# Patient Record
Sex: Male | Born: 1950 | Race: Black or African American | Hispanic: No | Smoking: Never smoker
Health system: Southern US, Community
[De-identification: ages and names within clinical notes are randomized; demographics above are authoritative.]

## PROBLEM LIST (undated history)

## (undated) DIAGNOSIS — N289 Disorder of kidney and ureter, unspecified: Secondary | ICD-10-CM

## (undated) DIAGNOSIS — M1990 Unspecified osteoarthritis, unspecified site: Secondary | ICD-10-CM

## (undated) DIAGNOSIS — E119 Type 2 diabetes mellitus without complications: Secondary | ICD-10-CM

## (undated) DIAGNOSIS — J449 Chronic obstructive pulmonary disease, unspecified: Secondary | ICD-10-CM

## (undated) DIAGNOSIS — I10 Essential (primary) hypertension: Secondary | ICD-10-CM

## (undated) DIAGNOSIS — C801 Malignant (primary) neoplasm, unspecified: Secondary | ICD-10-CM

## (undated) DIAGNOSIS — J45909 Unspecified asthma, uncomplicated: Secondary | ICD-10-CM

## (undated) DIAGNOSIS — I2510 Atherosclerotic heart disease of native coronary artery without angina pectoris: Secondary | ICD-10-CM

## (undated) HISTORY — PX: INSERTION OF DIALYSIS CATHETER: SHX1324

## (undated) HISTORY — PX: NO PAST SURGERIES: SHX2092

---

## 1998-01-20 ENCOUNTER — Encounter: Admission: RE | Admit: 1998-01-20 | Discharge: 1998-01-20 | Payer: Self-pay | Admitting: Internal Medicine

## 1998-01-24 ENCOUNTER — Ambulatory Visit (HOSPITAL_COMMUNITY): Admission: RE | Admit: 1998-01-24 | Discharge: 1998-01-24 | Payer: Self-pay | Admitting: Physician Assistant

## 1998-02-15 ENCOUNTER — Emergency Department (HOSPITAL_COMMUNITY): Admission: EM | Admit: 1998-02-15 | Discharge: 1998-02-15 | Payer: Self-pay | Admitting: Emergency Medicine

## 1999-10-16 ENCOUNTER — Encounter: Admission: RE | Admit: 1999-10-16 | Discharge: 1999-10-16 | Payer: Self-pay | Admitting: Hematology and Oncology

## 1999-11-14 ENCOUNTER — Encounter: Payer: Self-pay | Admitting: Emergency Medicine

## 1999-11-14 ENCOUNTER — Emergency Department (HOSPITAL_COMMUNITY): Admission: EM | Admit: 1999-11-14 | Discharge: 1999-11-14 | Payer: Self-pay | Admitting: Emergency Medicine

## 1999-12-10 ENCOUNTER — Inpatient Hospital Stay (HOSPITAL_COMMUNITY): Admission: EM | Admit: 1999-12-10 | Discharge: 1999-12-12 | Payer: Self-pay | Admitting: Emergency Medicine

## 1999-12-11 ENCOUNTER — Encounter: Payer: Self-pay | Admitting: Internal Medicine

## 2000-01-15 ENCOUNTER — Encounter: Admission: RE | Admit: 2000-01-15 | Discharge: 2000-01-15 | Payer: Self-pay | Admitting: Internal Medicine

## 2000-02-06 ENCOUNTER — Ambulatory Visit (HOSPITAL_COMMUNITY): Admission: RE | Admit: 2000-02-06 | Discharge: 2000-02-06 | Payer: Self-pay | Admitting: Internal Medicine

## 2001-05-25 ENCOUNTER — Encounter: Payer: Self-pay | Admitting: Emergency Medicine

## 2001-05-25 ENCOUNTER — Emergency Department (HOSPITAL_COMMUNITY): Admission: EM | Admit: 2001-05-25 | Discharge: 2001-05-25 | Payer: Self-pay | Admitting: *Deleted

## 2001-05-25 ENCOUNTER — Emergency Department (HOSPITAL_COMMUNITY): Admission: EM | Admit: 2001-05-25 | Discharge: 2001-05-25 | Payer: Self-pay | Admitting: Emergency Medicine

## 2001-05-27 ENCOUNTER — Observation Stay (HOSPITAL_COMMUNITY): Admission: EM | Admit: 2001-05-27 | Discharge: 2001-05-28 | Payer: Self-pay | Admitting: Emergency Medicine

## 2001-05-27 ENCOUNTER — Encounter: Payer: Self-pay | Admitting: Emergency Medicine

## 2001-07-26 ENCOUNTER — Inpatient Hospital Stay (HOSPITAL_COMMUNITY): Admission: AD | Admit: 2001-07-26 | Discharge: 2001-08-03 | Payer: Self-pay | Admitting: Psychiatry

## 2002-02-15 ENCOUNTER — Emergency Department (HOSPITAL_COMMUNITY): Admission: EM | Admit: 2002-02-15 | Discharge: 2002-02-15 | Payer: Self-pay | Admitting: Emergency Medicine

## 2002-07-11 ENCOUNTER — Emergency Department (HOSPITAL_COMMUNITY): Admission: EM | Admit: 2002-07-11 | Discharge: 2002-07-11 | Payer: Self-pay | Admitting: Emergency Medicine

## 2002-07-18 ENCOUNTER — Emergency Department (HOSPITAL_COMMUNITY): Admission: EM | Admit: 2002-07-18 | Discharge: 2002-07-18 | Payer: Self-pay | Admitting: Emergency Medicine

## 2002-11-01 ENCOUNTER — Encounter: Admission: RE | Admit: 2002-11-01 | Discharge: 2002-11-01 | Payer: Self-pay | Admitting: Internal Medicine

## 2002-11-07 ENCOUNTER — Inpatient Hospital Stay (HOSPITAL_COMMUNITY): Admission: EM | Admit: 2002-11-07 | Discharge: 2002-11-10 | Payer: Self-pay

## 2002-11-09 ENCOUNTER — Encounter: Payer: Self-pay | Admitting: Cardiology

## 2002-11-17 ENCOUNTER — Encounter: Admission: RE | Admit: 2002-11-17 | Discharge: 2002-11-17 | Payer: Self-pay | Admitting: Internal Medicine

## 2002-11-22 ENCOUNTER — Encounter: Admission: RE | Admit: 2002-11-22 | Discharge: 2002-11-22 | Payer: Self-pay | Admitting: Internal Medicine

## 2003-03-26 ENCOUNTER — Encounter: Payer: Self-pay | Admitting: Emergency Medicine

## 2003-03-26 ENCOUNTER — Emergency Department (HOSPITAL_COMMUNITY): Admission: AC | Admit: 2003-03-26 | Discharge: 2003-03-27 | Payer: Self-pay

## 2003-06-02 ENCOUNTER — Emergency Department (HOSPITAL_COMMUNITY): Admission: AD | Admit: 2003-06-02 | Discharge: 2003-06-02 | Payer: Self-pay | Admitting: Emergency Medicine

## 2003-07-12 ENCOUNTER — Encounter: Admission: RE | Admit: 2003-07-12 | Discharge: 2003-07-12 | Payer: Self-pay | Admitting: Internal Medicine

## 2003-08-09 ENCOUNTER — Inpatient Hospital Stay (HOSPITAL_COMMUNITY): Admission: EM | Admit: 2003-08-09 | Discharge: 2003-08-12 | Payer: Self-pay | Admitting: Emergency Medicine

## 2003-08-11 ENCOUNTER — Encounter (INDEPENDENT_AMBULATORY_CARE_PROVIDER_SITE_OTHER): Payer: Self-pay | Admitting: Cardiology

## 2003-08-23 ENCOUNTER — Encounter: Admission: RE | Admit: 2003-08-23 | Discharge: 2003-08-23 | Payer: Self-pay | Admitting: Internal Medicine

## 2003-12-03 ENCOUNTER — Emergency Department (HOSPITAL_COMMUNITY): Admission: AD | Admit: 2003-12-03 | Discharge: 2003-12-03 | Payer: Self-pay | Admitting: Emergency Medicine

## 2003-12-09 ENCOUNTER — Encounter: Admission: RE | Admit: 2003-12-09 | Discharge: 2003-12-09 | Payer: Self-pay | Admitting: Internal Medicine

## 2006-04-03 ENCOUNTER — Emergency Department (HOSPITAL_COMMUNITY): Admission: EM | Admit: 2006-04-03 | Discharge: 2006-04-03 | Payer: Self-pay | Admitting: Emergency Medicine

## 2007-01-30 ENCOUNTER — Encounter (INDEPENDENT_AMBULATORY_CARE_PROVIDER_SITE_OTHER): Payer: Self-pay | Admitting: *Deleted

## 2007-01-30 ENCOUNTER — Ambulatory Visit: Payer: Self-pay | Admitting: *Deleted

## 2007-01-30 ENCOUNTER — Emergency Department (HOSPITAL_COMMUNITY): Admission: EM | Admit: 2007-01-30 | Discharge: 2007-01-30 | Payer: Self-pay | Admitting: Emergency Medicine

## 2007-04-01 ENCOUNTER — Inpatient Hospital Stay (HOSPITAL_COMMUNITY): Admission: EM | Admit: 2007-04-01 | Discharge: 2007-04-10 | Payer: Self-pay | Admitting: Emergency Medicine

## 2007-04-01 ENCOUNTER — Ambulatory Visit: Payer: Self-pay | Admitting: Pulmonary Disease

## 2007-04-10 ENCOUNTER — Ambulatory Visit: Payer: Self-pay | Admitting: *Deleted

## 2007-04-21 ENCOUNTER — Ambulatory Visit: Payer: Self-pay | Admitting: Psychiatry

## 2007-04-23 ENCOUNTER — Ambulatory Visit: Payer: Self-pay | Admitting: Family Medicine

## 2007-04-23 ENCOUNTER — Encounter (INDEPENDENT_AMBULATORY_CARE_PROVIDER_SITE_OTHER): Payer: Self-pay | Admitting: Nurse Practitioner

## 2007-04-23 LAB — CONVERTED CEMR LAB
ALT: 24 units/L (ref 0–53)
AST: 24 units/L (ref 0–37)
Alkaline Phosphatase: 111 units/L (ref 39–117)
Basophils Absolute: 0 10*3/uL (ref 0.0–0.1)
Basophils Relative: 0 % (ref 0–1)
Eosinophils Absolute: 0.1 10*3/uL (ref 0.0–0.7)
Eosinophils Relative: 2 % (ref 0–5)
Glucose, Bld: 126 mg/dL — ABNORMAL HIGH (ref 70–99)
HCT: 41.7 % (ref 39.0–52.0)
MCHC: 32.9 g/dL (ref 30.0–36.0)
MCV: 79.7 fL (ref 78.0–100.0)
Neutrophils Relative %: 48 % (ref 43–77)
Platelets: 219 10*3/uL (ref 150–400)
Potassium: 3.2 meq/L — ABNORMAL LOW (ref 3.5–5.3)
RDW: 14.1 % — ABNORMAL HIGH (ref 11.5–14.0)
Sodium: 141 meq/L (ref 135–145)
Total Bilirubin: 0.5 mg/dL (ref 0.3–1.2)
Total Protein: 6.9 g/dL (ref 6.0–8.3)

## 2007-04-24 ENCOUNTER — Encounter (INDEPENDENT_AMBULATORY_CARE_PROVIDER_SITE_OTHER): Payer: Self-pay | Admitting: Nurse Practitioner

## 2007-04-24 LAB — CONVERTED CEMR LAB: Hgb A1c MFr Bld: 6.4 % — ABNORMAL HIGH (ref 4.6–6.1)

## 2007-05-06 ENCOUNTER — Ambulatory Visit: Payer: Self-pay | Admitting: Family Medicine

## 2007-05-11 ENCOUNTER — Ambulatory Visit (HOSPITAL_BASED_OUTPATIENT_CLINIC_OR_DEPARTMENT_OTHER): Admission: RE | Admit: 2007-05-11 | Discharge: 2007-05-11 | Payer: Self-pay | Admitting: Urology

## 2007-05-15 ENCOUNTER — Ambulatory Visit: Payer: Self-pay | Admitting: Internal Medicine

## 2007-05-21 ENCOUNTER — Ambulatory Visit (HOSPITAL_COMMUNITY): Admission: RE | Admit: 2007-05-21 | Discharge: 2007-05-22 | Payer: Self-pay | Admitting: Urology

## 2007-06-08 ENCOUNTER — Ambulatory Visit: Payer: Self-pay | Admitting: Internal Medicine

## 2007-10-26 ENCOUNTER — Ambulatory Visit: Payer: Self-pay | Admitting: Cardiology

## 2007-10-26 ENCOUNTER — Inpatient Hospital Stay (HOSPITAL_COMMUNITY): Admission: EM | Admit: 2007-10-26 | Discharge: 2007-11-01 | Payer: Self-pay | Admitting: Emergency Medicine

## 2007-10-26 ENCOUNTER — Encounter: Payer: Self-pay | Admitting: Cardiology

## 2007-11-08 ENCOUNTER — Emergency Department (HOSPITAL_COMMUNITY): Admission: EM | Admit: 2007-11-08 | Discharge: 2007-11-08 | Payer: Self-pay | Admitting: Emergency Medicine

## 2007-11-18 ENCOUNTER — Ambulatory Visit: Payer: Self-pay | Admitting: Cardiology

## 2007-11-18 LAB — CONVERTED CEMR LAB
BUN: 29 mg/dL — ABNORMAL HIGH (ref 6–23)
Calcium: 8.7 mg/dL (ref 8.4–10.5)
Creatinine, Ser: 2.1 mg/dL — ABNORMAL HIGH (ref 0.4–1.5)
GFR calc Af Amer: 42 mL/min
Potassium: 3.3 meq/L — ABNORMAL LOW (ref 3.5–5.1)

## 2007-11-22 ENCOUNTER — Ambulatory Visit: Payer: Self-pay | Admitting: Cardiology

## 2007-11-22 ENCOUNTER — Inpatient Hospital Stay (HOSPITAL_COMMUNITY): Admission: EM | Admit: 2007-11-22 | Discharge: 2007-11-27 | Payer: Self-pay | Admitting: Emergency Medicine

## 2007-12-09 ENCOUNTER — Ambulatory Visit: Payer: Self-pay | Admitting: Cardiology

## 2007-12-09 LAB — CONVERTED CEMR LAB
BUN: 19 mg/dL (ref 6–23)
CO2: 27 meq/L (ref 19–32)
Calcium: 9 mg/dL (ref 8.4–10.5)
Chloride: 109 meq/L (ref 96–112)
GFR calc Af Amer: 45 mL/min
GFR calc non Af Amer: 37 mL/min

## 2007-12-23 ENCOUNTER — Ambulatory Visit: Payer: Self-pay

## 2007-12-23 ENCOUNTER — Ambulatory Visit: Payer: Self-pay | Admitting: Cardiology

## 2007-12-23 LAB — CONVERTED CEMR LAB
ALT: 27 units/L (ref 0–53)
AST: 27 units/L (ref 0–37)
Albumin: 3.5 g/dL (ref 3.5–5.2)
HDL: 28.3 mg/dL — ABNORMAL LOW (ref >39.0)
Triglycerides: 94 mg/dL (ref 0–149)

## 2008-01-25 ENCOUNTER — Emergency Department (HOSPITAL_COMMUNITY): Admission: EM | Admit: 2008-01-25 | Discharge: 2008-01-25 | Payer: Self-pay | Admitting: Emergency Medicine

## 2011-02-05 NOTE — Discharge Summary (Signed)
NAME:  ANDEN, BARTOLO NO.:  [id number]   MEDICAL RECORD NO.:  [id number]          PATIENT TYPE:  INP   LOCATION:  1539                         FACILITY:  [facility]   PHYSICIAN:  Casimiro Needle B. Sherene Sires, MD, FCCPDATE OF BIRTH:  Feb 02, 1951   DATE OF ADMISSION:  04/01/2007  DATE OF DISCHARGE:  04/10/2007                               DISCHARGE SUMMARY   DISCHARGE DIAGNOSES:  1. Overdose with clonidine.  2. Severe hypertension.   HISTORY OF PRESENT ILLNESS:  [name] is a 60 year old black male who  has a known history of polysubstance abuse including cocaine in the  past.  He presented to the emergency room at [facility] with  altered mental status.  He was found to be extremely hypertensive and  was suspected to have overdosed on clonidine.  He was admitted for  further evaluation and treatment.   LABORATORY DATA:  A 12-lead EKG demonstrates normal sinus rhythm,  ventricular rate of 73 beats per minute.  Urine drug screen was  negative.  Acetaminophen level was less than 10.  Total protein 7.1,  albumin 3.6, AST is 37, ALT is 39, ALP is 133, total bilirubin is 0.5.  B-type natriuretic peptide is 82.7.  TSH is 2.74.  Sodium 137, potassium  3.1, chloride 104, CO2 is 24, glucose is 147, BUN is 27, creatinine  1.55.  WBC is 7.2, hemoglobin is 14.8, hematocrit is 42.9, platelets are  224.  On 3 L nasal cannula, pH is 7.455, pCO2 of 38, pO2 of 121, with  bicarb of 26.3.  Head CT without contrast demonstrates normal  examination.  Portable chest shows mild cardiomegaly with low  inspiratory lung volumes, no acute findings.   HOSPITAL COURSE BY DISCHARGE DIAGNOSIS:  Problem 1.  ALTERED MENTAL STATUS WITH CLONIDINE OVERDOSE:  He was  admitted to the [facility] and admitted initially to the  intensive care unit, then subsequently to the floors.  He was noted to  be hypertensive and requiring Apresoline IV.  Of note, at intermittent  times he refused treatment  for his hypertension.  He was placed back on  his hypertensive medications, which consist of clonidine 0.3 mg three  times a day and minoxidil 5 mg twice a day.  He has remained  hypertensive until his time of signing out discharge against medical  advice on April 10, 2007.   Problem 2.  ALTERED MENTAL STATUS WITH A FORMAL PSYCHIATRIC EVALUATION  PERFORMED BY DR. Jeanie Sewer.  Initially it was thought that he needed  admission for his outstanding history.  He subsequently was found to no  longer meet criteria for involuntary commitment.  He was given the  option of follow-up with outpatient mental health care.  This was set up  for him.  Whether or not he will utilize it is not known.  He signed out  against medical advice on April 10, 2007.   DISCHARGE MEDICATIONS:  Again, he singed out against medical advice.  He  was supposed to be on clonidine 0.3 mg three times a day and minoxidil 5  mg b.i.d.   His  diet is a low-sodium diet.   FOLLOW-UP APPOINTMENT:  He had none since he signed out against medical  advice.  He was to follow up with outpatient mental health.  Again, this  will be his choice on an outpatient basis.   DISPOSITION/CONDITION ON DISCHARGE:  His hypertension was somewhat  improved but he did remain hypertensive prior to discharge.  His altered  mental status improved.  His psychiatric ideation improved while he was  in the hospital and he declined further follow-up.  He signed out [facility] on  April 10, 2007.      Devra Dopp, MSN, ACNP      Charlaine Dalton. Sherene Sires, MD, [facility]  Electronically Signed    SM/MEDQ  D:  04/27/2007  T:  04/27/2007  Job:  [id number]   cc:   Antonietta Breach, M.D.

## 2011-02-05 NOTE — Op Note (Signed)
NAME:  Noah Lewis, Noah Lewis NO.:  [id number]   MEDICAL RECORD NO.:  [id number]          PATIENT TYPE:  OIB   LOCATION:  1439                         FACILITY:  [facility]   PHYSICIAN:  Lindaann Slough, M.D.  DATE OF BIRTH:  [birth year]   DATE OF PROCEDURE:  05/21/2007  DATE OF DISCHARGE:                               OPERATIVE REPORT   PREOPERATIVE DIAGNOSIS:  Scrotal abscess.   POSTOPERATIVE DIAGNOSIS:  Scrotal abscess.   PROCEDURE PERFORMED:  Incision and drainage of scrotal abscess with  debridement.   SURGEON:  Danae Chen, M.D.   ASSISTANT:  Melina Schools.   ANESTHESIA:  General.   INDICATIONS FOR PROCEDURE:  Mr. Bartnik is a 60 year old male who  underwent a transscrotal hydrocelectomy approximately 8 days ago.  Please note the patient is diabetic.  The patient returned to the office  with a complaint of this several days increased swelling, drainage and  subjective fevers.  On physical exam, he was noted fluctuance in his  left hemiscrotum and a few areas of purulent drainage, most notably at  the site of his Penrose drain.  He was taken to the operating room for  drainage.   DESCRIPTION OF PROCEDURE:  The patient was brought to the operating  room.  He was identified and placed in the supine position.  Informed  consent was given and preoperative time-out performed.  After the  successful induction of general anesthesia, the patient's genitalia were  prepped and draped in the usual fashion.  Sequential compression devices  were employed.  Vancomycin and kanamycin were given as perioperative  antibiotics.  The surgeons were gowned and gloved.  We examined under  anesthesia and noted that the right testis appeared palpably normal.  There was breakdown at the area of the previous incision along the  median raphe.  This was bluntly entered.  We noted the scrotal septum to  be intact and there was no inflammation crossing midline.  We then  proceeded laterally  with the digital palpation and easily entered an  area of purulence.  We drained approximately 100 mL of pus.  Some of  this was collected and sent for culture, both aerobic and anaerobic.  We  then delivered the testis into the operative field.  We noted the  previously repaired hydrocele with the bottle neck repair intact.  The  testis and epididymis were examined and appeared viable.  We then  debrided some tissue along the lateral aspect.  We used Bovie cautery to  ensure adequate hemostasis.  We then again reexamined the inside wall to  ensure that there were no undrained pockets of pus.  When we were  satisfied, the wound was packed with saline moistened Kerlix and the  procedure was terminated.  The patient tolerated procedure well and no  complications.   Dr. Brunilda Payor was the primary responsible physician and was present and  participated in all aspects of procedure.   DISPOSITION:  The patient was extubated uneventfully, transferred safely  to post anesthesia.     ______________________________  Lennox Grumbles, M.D.  Electronically Signed    JR/MEDQ  D:  05/21/2007  T:  05/22/2007  Job:  [id number]

## 2011-02-05 NOTE — Consult Note (Signed)
NAME:  MENG, WINTERTON NO.:  [id number]   MEDICAL RECORD NO.:  [id number]          PATIENT TYPE:  INP   LOCATION:  1539                         FACILITY:  [facility]   PHYSICIAN:  Nelda Bucks, MD DATE OF BIRTH:  1950-12-11   DATE OF CONSULTATION:  04/09/2007  DATE OF DISCHARGE:  04/10/2007                                 CONSULTATION   BRIEF NOTE:  The patient's nurse at the bedside today on April 09, 2007,  at approximately 10 p.m. called secondary to a blood pressure of  190/104.  Mr. Duque came in secondary to clonidine overdose and known  to have hypertension and, now is refusing his clonidine blood pressure  medication dosed at 0.3 t.i.d.  The patient also receives minoxidil.  The patient is not having headaches and no other symptoms at this time.  The patient is alert, according to nurse, oriented x3, appropriate and  understands the ramifications of refusing his blood pressure pills.  I  stressed to the nurse to speak directly with the patient to tell him  that he could have a stroke, a heart attack, heart failure, or die  secondary to not taking his clonidine medications.  I also offered this  patient to be placed a patch of clonidine 0.3 at this time which he has  accepted.  We will place the patch and hope for improvement in his blood  pressure.  If this does not respond, we will again have another  conversation regarding the need for blood pressure medications and the  risks as noted above for not taking his blood pressure medications.      Nelda Bucks, MD  Electronically Signed     DJF/MEDQ  D:  04/09/2007  T:  04/10/2007  Job:  [id number]

## 2011-02-05 NOTE — Consult Note (Signed)
NAME:  Noah Lewis, SEBRING NO.:  [id number]   MEDICAL RECORD NO.:  [id number]          PATIENT TYPE:  INP   LOCATION:  5158                         FACILITY:  [facility]   PHYSICIAN:  Darryl D. Prime, MD    DATE OF BIRTH:  09/23/1951   DATE OF CONSULTATION:  DATE OF DISCHARGE:                                 CONSULTATION   CARDIOLOGIST:  Dr. Jens Som.   REASON FOR CONSULTATION:  Pre-operative recommendations, given a history  of recent hypotensive crisis and a history of coronary artery disease,  recent chest pain.   HISTORY OF PRESENT ILLNESS:  Mr. 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  The patient had  a CT of the abdomen that showed sigmoid diverticulitis, with possible  microperforation and early abscess in that area, admitted to surgery for  close observation, possible surgery if he worsens, possible elective  surgery in the morning.  He was placed on Invanz.  The patient had a  recent admission on the October 26, 2007, with shortness of breath for  two weeks and chest pain, not taking any blood pressure meds at that  time and had heavy salt load prior and presented with a blood pressure  of 202/133, EKG showing left ventricular hypertrophy with ST  depressions, T-wave inversions, repolarization abnormalities consistent  with LVH.  He had a creatinine of 2.0 at that time.  Baseline was 105.  B-type natriuretic peptide was 851.  A chest x-ray showed a pulmonary  edema.  He was diuresed on blood pressure control and had an adenosine  Myoview on the 6th of February, showing an ejection fraction of 39%.  It  is global hypokinesis and no ischemia, which echo on admission showed an  ejection fraction of 60% with  left ventricular hypertrophy, right  ventricular hypertrophy.  He was seen in the emergency room November 08, 2007 here at [facility] for left arm numbness and left leg numbness for  10 seconds.  Blood pressure then was 104/72 with a creatinine of 2.7,  not admitted.  Seen in the clinic on November 18, 2007, blood pressure  159/105 and weight 205 pounds and noting that he was compliant with his  meds.  The patient, since last admitted, had three episodes of chest  pain lasting a few minutes and relieved with nitroglycerin.  His last  episode was on yesterday.  He denies any recent shortness of breath,  paroxysmal nocturnal dyspnea, or orthopnea.   PAST MEDICAL HISTORY:  As above, and also a history of:  1. Uncontrolled hypertension.  2. Coronary artery disease, status post MI, one in 69.  He has had a      catheterization in 2004 showing left ventricular ejection fraction      of 40% to 55%, but no significant coronary artery disease then.  3. History of scrotal abscess in 2008.  4. History of diabetes type 2.  Hemoglobin A1c last month was 6.6.  5. History of peripheral vascular disease.  6. History of hyperlipidemia.  7. History of polysubstance abuse.  8. History of continued history of left ventricular hypertrophy as      above.   MEDICATIONS:  He is on:  1. Lasix 40 mg daily.  2. Hydralazine 100 mg 4 times a day.  3. Norvasc 10 mg daily.  4. Zocor 40 mg daily.  5. Potassium 20 mEq daily.  6. Labetalol 400 mg twice a day.   ALLERGIES:  HE IS ALLERGIC TO PENICILLIN WHICH CAUSES HIVES.   SOCIAL HISTORY:  He is a smoker for greater than 30 years, one pack per  day.  He is separated, has two children.  History of polysubstance abuse  as above, none in 15 years.  History of alcohol in the form of liquor 2  to 3 times a week.   FAMILY HISTORY:  Positive for mother with diabetes, sister with  diabetes.   REVIEW OF SYSTEMS:  A 14-point review of systems negative, unless  stated  above.  He has not had any fevers or nausea and vomiting.   PHYSICAL EXAMINATION:  VITAL SIGNS:  Temperature was 97.6 with a pulse  of 96, respiratory rate is 18, blood pressure 151/97, sats are 96% on  room air.  GENERAL:  He is a male that looks his stated age in no acute distress,  laying flat in bed.  HEENT:  Eyes:  Pupils are equal, round, and reactive to light with  extraocular movements intact.  He has a pterygia laterally on the right  side in the eye.  The conjunctivae is not pale, anicteric sclerae,  oropharynx is dry significantly so.  No posterior pharyngeal lesions.  NECK:  Supple with no lymphadenopathy, thyromegaly.  No bruits.  ABDOMEN:  Mildly distended with significant tenderness, diffusely more  so, apparently in the left lower quadrant.  LUNGS:  Clear to auscultation bilaterally.  CARDIOVASCULAR:  Regular rhythm and rate, with a S4, no S3, normal S1,  S2, no murmurs.  EXTREMITIES:  The extremities show no clubbing, cyanosis.  There is  trace lower extremity edema.  NEUROLOGICAL:  He is alert and oriented x4, cranial nerves 2-12 grossly  intact, strength and sensation grossly intact.   LABORATORY DATA:  His laboratory data shows a sodium of 136, potassium  3.6, chloride 104, bicarb 24, BUN 18, creatinine 2.04, glucose 98, alk  phos of 128, albumin 3.0, otherwise normal LFTs, white count 60.6,  hemoglobin 12.4, hematocrit 37, platelets 252, segs of 77, lymphocytes  11, MCV is 78.5.  Urinalysis shows a protein of 100, otherwise negative.  Lipase was normal.  EKG showed normal sinus rhythm with a ventricular  rate of 92, normal axis and intervals, PR interval 160, QRS 86, QT  corrected 442 msec.  Showed LVH with repolarization abnormalities, T-  wave inversions in the inferolateral leads, no major change compared to  EKG November 08, 2007.   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  Has  had some chest pain since then, who now may have to have some elective  surgery for diverticulitis with microperforation.  The patient's blood  pressure  is relatively controlled now.  It is recommended that his blood  pressure be well-controlled during the course of hospitalization, and  that includes continue his current medication  regimen.  Should there be an event where he can not take his p.o.  medications, it is recommended that he be on 1-2 mg a minute of  intravenous labetalol during that time.   Thank you very much for the consult.      Darryl D. Prime, MD  Electronically Signed     DDP/MEDQ  D:  11/22/2007  T:  11/22/2007  Job:  [id number]

## 2011-02-05 NOTE — Consult Note (Signed)
NAME:  Noah Lewis, Noah Lewis NO.:  [id number]   MEDICAL RECORD NO.:  [id number]          PATIENT TYPE:  INP   LOCATION:  1539                         FACILITY:  [facility]   PHYSICIAN:  Antonietta Breach, M.D.  DATE OF BIRTH:  05/06/1951   DATE OF CONSULTATION:  04/09/2007  DATE OF DISCHARGE:  04/10/2007                                 CONSULTATION   REQUESTING PHYSICIAN:  Sandrea Hughs.   REASON FOR CONSULTATION:  Rule out suicidal risk.   HISTORY OF PRESENT ILLNESS:  [name] is a 60 year old male admitted  to the [facility] on April 01, 2007.   [name] took an overdose of clonidine in a suicide attempt on April 01, 2007.  He states that he was impulsive and took the medicine in a  reaction to hearing that he was not going to be able to see his  children.  He realizes that it was an impulsive, irrational thing to do,  and he has an appropriate level of regret about it.   The patient did require medical treatment due to complications of the  overdose, and initially, he was resistant to being held in the hospital.  A psychiatrist from the behavioral health department of [facility] evaluated him over the weekend and stated that he still  required confinement.   Earlier in the week, the patient states that he was intending to leave  against medical advise, and he was angry.  However, he realized that the  medical facility had reason to keep him, and he was able to resolve his  anger.   He has been compliant on the current medical ward with bedside care.  He  has agreed to take his blood pressure medicine and has not been  combative.  He has requested to leave the hospital; however, has  remained in his hospital room until further evaluated by the undersigned  without any agitation or combativeness.  He has been in touch with a law  enforcement member of his family regarding his rights.   The patient does not have any hallucinations or  delusions.  He states  that his interests are within normal limits.  He does have constructive  future goals.  He has been in contact with his ex-wife.  She states that  she will allow him to visit his children.   PAST PSYCHIATRIC HISTORY:  The patient does acknowledge having history  of violence.  It is reported that he went to jail for many years for  murder.  The patient is interested in the mental health follow-up.  However, he declines inpatient psychiatric care.   The patient does have a history of inpatient psychiatric care in  November 2002.  At that time, he was admitted to the [facility] with a relapse on alcohol, cocaine and marijuana.  He was stressed by his finding out that his wife had been sleeping with  another man.  He had been drinking 6-12 beers daily and using cocaine  several times a week.  He was having some intermittent suicidal thoughts  without plan or intent.  He had acknowledged picking up his wife by the  neck and then putting her back down.  He had heard an auditory  hallucinations stating go  and get clean.  He was treated with a  phenobarbital taper, Zoloft for antidepression and Zyprexa for  antipsychosis and agitation.  He was discharged on Zoloft 100 mg daily  and Zyprexa 7.5 mg every night.  His diagnoses were depression not  otherwise specified, cannabis abuse, cocaine abuse and alcohol abuse.   The patient has also been admitted multiple times to [facility] for suicide attempts.   FAMILY PSYCHIATRIC HISTORY:  None known.   SOCIAL HISTORY:  The patient was educated through the 11th grade.  He  has been medically disabled from hypertension and a back condition as  well as myocardial infarction.  He has a 96 year old daughter.   The patient was in prison for violent crimes.   PAST MEDICAL HISTORY:  1. Hypertension.  2. History of syncope.  3. Coronary artery disease.  4. Myocardial infarction.  5. Hydrocele  of the right testicle.  6. Diabetes type 2.  7. Hypertriglyceridemia.  8. Peripheral vascular disease.   MEDICATIONS:  MAR is reviewed.  The patient is currently undergoing  stabilization of his blood pressure.  He is undergoing a clonidine  patch.   ALLERGIES:  PENICILLIN.   Head CT without contrast on July 9 showed a normal examination.   REVIEW OF SYSTEMS:  Noncontributory.   MENTAL STATUS EXAM:  Noah Lewis is a middle-aged male sitting up in his  hospital chair in no apparent distress.  He has intact attention span as  well as concentration.  He is oriented to all spheres.  His memory is  intact to immediate recent and remote.  His speech involves normal rate  and prosody without dysarthria.  Thought process - logical, coherent,  goal-directed.  No looseness of associations.  Thought content - no  thoughts of harming himself.  No thoughts of harming.  No delusions, no  hallucinations.  Affect is broad and appropriate.  Mood is within normal  limits.  Insight is intact.  Judgment is intact.   ASSESSMENT:  AXIS I:  Adjustment disorder with mixed disturbance of  emotions and conduct.  Polysubstance dependence.  AXIS II:  Deferred.  AXIS III:  See general medical problems.  AXIS IV:  General medical.  Primary support group.  Marital.  Legal.  AXIS V:  55.   Mr. Delucia is no longer at risk to harm himself.  He agrees to call  emergency services immediately for any thoughts of harming himself,  thoughts of harming others or distress.   The undersigned did recommend inpatient psychiatric further evaluation  and treatment given that the patient did make a suicide attempt  recently, and he has only been on a general medical floor.  The  inpatient psychiatric program would provide a way for him to receive  intensive psychotherapy quickly.   (The patient does not have health insurance, and outpatient programs  available to him are limited where as there is a potential county   finding for inpatient services including a chemical dependency inpatient  rehab unit downtown).   However, the patient declined, and he is no longer meeting the criteria  for forced care for petitioning for commitment   The patient states that he is motivated for outpatient mental health  follow-up.   The issue of the capacity for informed consent regarding  his blood  pressure treatment was discussed.  The patient does understand his  medical risk of morbidity and mortality if he does not get adequate  blood pressure treatment and control.  At this time, he is still  intending on leaving the hospital against medical advice.  He does  demonstrate the capacity for informed consent and the capacity to sign  out AMA fully aware of his risks.   RECOMMENDATIONS:  1. Given that the patient is currently in a stable state and the fact      that his past substance abuse patterns confound assessment as to      whether he would need a preventive mood stabilizer, no      psychotropics are recommended.  2. Would ask the social worker to set this patient up with outpatient      psychiatric follow-up as soon as possible within the next week.      Options include the alcohol and drug services of the county mental      health center as well as [facility].      Antonietta Breach, M.D.  Electronically Signed     JW/MEDQ  D:  04/15/2007  T:  04/16/2007  Job:  [id number]

## 2011-02-05 NOTE — Assessment & Plan Note (Signed)
[facility]                            CARDIOLOGY OFFICE NOTE   CAULDER, WEHNER                      MRN:          [id number]  DATE:11/18/2007                            DOB:          21-Aug-1951    The patient is a 56-year gentleman that was recently admitted to [facility] with dyspnea, chest discomfort and hypertensive urgency.  During that admission, the patient's blood pressure was severely  elevated.  He was treated with antihypertensives and he did improve.  Note, during that admission he had an echocardiogram that showed normal  LV function with estimated ejection fraction of 60%.  There was moderate  LVH.  There was mild aortic and mitral regurgitation.  The left atrium  was moderately to markedly dilated.  There was a small pericardial  effusion.  He also had a Myoview performed.  That showed ejection  fraction of 39%, but there was no ischemia.  He also had chest CT that  showed small pericardial effusion and two small pulmonary nodules with  recommend follow-up in 6 months.  Finally, the patient also had renal  insufficiency.  He was discharged on a blood pressure medications and  did feel improved.  He does have some dyspnea on exertion.  There is no  orthopnea, PND or pedal edema.  He has had two episodes of chest pain.  Each of these lasted for several minutes.  They were not pleuritic or  positional, nor were they related to food.  They were not exertional.  One was in the left chest area and was in the right.  Note, he does not  have exertional chest pain or syncope.  He does continue to smoke  cigarettes and marijuana.   He also was seen in the [facility] Room on November 08, 2007.  At that time, he states that his left arm and left leg went numb for  10 seconds.  He was given pain medications by his report.  He did have  blood work drawn during that evaluation.  His point of care markers were  negative.   Creatinine was 2.7.  His BUN was 31.  Potassium was 3.8.   PRESENT MEDICATIONS:  1. Include Lasix 40 mg p.o. daily.  2. Hydralazine 100 mg p.o. four times daily.  3. Norvasc 10 mg daily.  4. Zocor 40 mg daily.  5. Potassium 20 mEq p.o. daily.  6. Labetalol 400  mg p.o. b.i.d.   PHYSICAL EXAM:  Shows a blood pressure 159/105, but the patient has not  taken his medications today.  His pulse is 86.  Weight is 205 pounds.  HEENT:  Normal.  NECK:  Supple.  CHEST:  Clear.  CARDIOVASCULAR:  Regular rate and rhythm.  ABDOMEN: Shows no tenderness.  EXTREMITIES:  Showed no edema.   His electrocardiogram shows sinus rhythm at a rate of 80.  There is  lateral T-wave inversion which is improved compared to the hospital.   DIAGNOSES:  1. Severe hypertension - I have asked him to continue his present      medications.  He  will track his blood pressure we will adjust his      regimen as indicated.  We will check renal Dopplers as well.  2. Recent numb sensation in the left upper and left lower extremity -      We will check carotid Dopplers.  3. Renal insufficiency - We will check a BMET today and I will refer      him to nephrology for close followup.  4. Hyperlipidemia - He will continue on his Zocor and we will check      lipids and liver in 4 weeks and adjust as indicated.  5. Substance abuse/tobacco abuse - I have discussed importance of      discontinuing both of these.  6. History of abnormal chest CT - He will need a followup chest CT      without contrast in 6 months.  7. Question of infiltrative cardiomyopathy on echocardiogram - This is      most likely related to be the patient's hypertension.   I will see him back in 3 months.     Madolyn Frieze Jens Som, MD, [facility]  Electronically Signed    BSC/MedQ  DD: 11/18/2007  DT: 11/18/2007  Job #: [id number]

## 2011-02-05 NOTE — H&P (Signed)
NAME:  Noah Lewis, Noah Lewis NO.:  [id number]   MEDICAL RECORD NO.:  [id number]          PATIENT TYPE:  INP   LOCATION:  3714                         FACILITY:  [facility]   PHYSICIAN:  Madolyn Frieze. Jens Som, MD, FACCDATE OF BIRTH:  [birth year]   DATE OF ADMISSION:  10/26/2007  DATE OF DISCHARGE:                              HISTORY & PHYSICAL   TIME OF ADMISSION:  Approximately 12:00 p.m.   CHIEF COMPLAINT:  Shortness of breath x3 weeks.   Patient does not have a primary MD or a primary cardiologist.   HISTORY OF PRESENT ILLNESS:  Noah Lewis is a 60 year old male with a  history of uncontrolled hypertension, new-onset diabetes type 2, and a  history of myocardial infarctions in the 80s with a catheterization in  2004 showing a left ventricular ejection fraction of 40-45% and no  significant CAD or outflow obstruction, who presents with three weeks of  worsening shortness of breath on exertion, chest pressure, and  orthopnea.  The shortness of breath with exertion with associated chest  pressure and chest pain occurs after walking half a block or lying flat  for 20 seconds.  The symptoms are relieved with rest and sitting up.  His pain is described as a 9/10.  It is more of a pressure pain with  occasional stabbing sensations located in the center of his chest and  radiating down his left arm on occasion.  Nitroglycerin has relieved the  pain.  At home, he is not taking anything for his symptoms other than  the nitroglycerin sublingual.  He has also been having some  lightheadedness but has never actually passed out or has had any change  in his mental status.  He has not had any diaphoresis.  He states that  he has been off of his blood pressure medication for 3-4 months simply  because he does not like to take pills.  He knows that in the past he  has been on clonidine.   He presents to the ER today because he felt like he was going to pass  out, and he was walking with  his nephew, who saw how bad he looked and  forced him to come to the emergency room.  The night prior to admission,  he had some whiskey and a lot of fried chicken while watching the Super  Bowl.  He has had some coughing with his symptoms and then coughed up a  scant amount of blood the other day.   PAST MEDICAL HISTORY:  1. Uncontrolled hypertension.  2. He is status post an MI, the last one in 27.  He had a      catheterization in 1986 and the most recent one in 2004 with a      normal left ventricular ejection fraction of 40-45%, as previously      stated.  He also had an Adenosine Cardiolite with an ejection      fraction of 43%.  3. He had a scrotal abscess in 2008.  4. Diabetes mellitus, type 2, for which he has taken medications in  the past.  5. Peripheral vascular disease.  6. Hypertriglyceridemia.  7. History of alcohol abuse.  8. History of polysubstance abuse.  9. History of LVH.  10.History of peptic ulcer disease.   SOCIAL HISTORY:  He is a smoker.  He says that he has smoked one pack  over a month for over 30 years.  He lives in the [place].  He is  separated, but he has two children.  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  He denies drug use over the last 15 years.  He has  not used any herbal medications.  He states that he drinks 40 ounces of  beer about once a month and hard liquor infrequently, maybe 2-3 times a  week or two.  He does not drink wine.  He does not exercise.   He has a mother with diabetes and many other medical conditions.  His  father died of an MVA.  His sister has diabetes.   REVIEW OF SYSTEMS:  Positive for headache that he developed after  starting on a nitroglycerin drip.  He also has nocturia, 2-3 episodes  per night.  This is consistent.  He says he has had some presyncopal  episodes.   CODE STATUS:  Patient states that he would like to be a DNR/DNI, but if  he needs a cardiac procedure, that during that  procedure should he code,  he would want to be resuscitated.   PHYSICAL EXAMINATION:  Temperature had not been taken prior to this  dictation.  His pulse is 100.  Respiratory rate 28.  Blood pressure  202/133 on presentation to the ED.  O2 sat 96% on room air.  He was sitting upright and in no apparent distress.  HEENT:  Benign.  NECK:  Supple with no bruits.  No TMG.  ABDOMEN:  Soft, nontender, nondistended with no rebound or guarding.  CARDIOVASCULAR:  S1 and S2.  No murmurs.  Regular rate and rhythm.  He  has an axillary displaced PMI and a positive S4.  PULMONARY:  He had bibasilar mild crackles with decreased breath sounds.  On his lower extremities, he had some changes consistent with venous  insufficiency and peripheral vascular disease, but he had only trace  bilateral edema.  He had no palpable cords.  He had left CVA tenderness.  He was alert and oriented x3.  Psych was appropriate with no gross  neurologic findings.   An x-ray was pertinent for CHF.   His EKG showed a rate of 98 with normal sinus rhythm, ST depression in  leads V4-6 that was consistent based on prior EKGs, normal intervals,  and LVH.  He was also normal axis.   Hemoglobin 13, hematocrit 38, white blood cell count 8, platelets 227.  On an i-STAT, his sodium was 145, potassium 2.8, chloride 107, bicarb  28, BUN 20, creatinine 2, glucose 109.  He had an BNP of 871.  The first  set of cardiac enzymes:  MB 1.7, troponin I 0.17.   ASSESSMENT/PLAN:  A 60 year old male with a past medical history of  diabetes, hypertension, medical noncompliance, cardiomegaly, depression,  admitted with two weeks of shortness of breath, off of his blood  pressure medications for at least four months.  Last echo in 2004 with  an EF of 40-45%.  Chest x-ray now consistent with CHF.  BNP 871,  consistent with CHF.  EKG essentially unchanged.  1. Shortness of breath:  Likely  secondary to congestive heart failure      exacerbation,  which again, most likely represents worsening of his      chronic condition; however, will rule out myocardial infarction,      unstable angina, acute coronary syndrome as possible etiologies.      Treat with Lasix, blood pressure control, as his severe      hypertension is also contributing to his heart failure.  Check a 2D      echo, TSH, cycle cardiac enzymes and EKGs.  Admit to telemetry.  As      it is unlikely that he is having an acute myocardial infarction, we      will discontinue the heparin, put him on Lovenox for DVT      prophylaxis.  Start a beta blocker, Coreg 3.125 b.i.d.  Hold ACE      inhibitor because of his elevated creatinine.  Will start ACE      inhibitor if his creatinine improves.  Start simvastatin 40 mg once      daily.  Morphine p.r.n. pain.  Oxygen.  Titrate nitroglycerin down.      Tylenol for pain for headache associated with the nitroglycerin      drip.  2. Hypertension:  Will need better control.  History of uncontrolled      hypertension in the past, even on medications.  Will start him now      on hydralazine t.i.d. for its concomitant beneficial effects in      African-American populations with CHF.  In addition, will start      beta blocker, Lasix.  Keep the nitro drip on for now.  Titrate his      medications up as needed.  Hold ACE inhibitor secondary to renal      insufficiency.  3. Acute renal failure, possibly secondary to hypovolemia in a setting      of congestive heart failure, inadequate perfusion.  Will address      the CHF and follow his creatinine to check for improvement.  Will      also address his diabetes and hypertension, as they are likely      causing worsening renal function.  4. Diabetes:  Will start sliding-scale insulin, check hemoglobin A1C,      start on medication as outpatient.  Would avoid Metformin in the      setting of decompensated congestive heart failure, and with      possibility that he may need a cathetarization  during this      hospitalization.  5. History of polysubstance abuse and alcohol abuse.  History that the      patient provided is pretty convincing; however, to be on the safe      side, I will check a UDS, start thiamine and folate.  Monitor for      symptoms of alcohol withdrawal but will not start the protocol at      this time.  6. History of depression:  Currently psych is appropriate.  Will      follow during this hospitalization.  7. History of ulcer:  Will initiate GI prophylaxis with Protonix.  No      symptoms indicative of GI pathology at this time.  8. Hypokalemia:  Likely secondary to Lasix administration.  Will      replete if hypokalemia persists.  Will check magnesium, phosphorus      and in the setting of renal failure, will do a full renal workup if  his symptoms do not improve.   This is Dr. Noel Gerold dictating on behalf of Dr. Flo Shanks, M.D.  Electronically Signed      Madolyn Frieze. Jens Som, MD, [facility]  Electronically Signed    JC/MEDQ  D:  10/26/2007  T:  10/26/2007  Job:  [id number]

## 2011-02-05 NOTE — Discharge Summary (Signed)
NAME:  Noah Lewis, Noah Lewis NO.:  [id number]   MEDICAL RECORD NO.:  [id number]          PATIENT TYPE:  INP   LOCATION:  5158                         FACILITY:  [facility]   PHYSICIAN:  Gabrielle Dare. Janee Morn, M.D.DATE OF BIRTH:  1950/11/24   DATE OF ADMISSION:  11/22/2007  DATE OF DISCHARGE:  11/27/2007                               DISCHARGE SUMMARY   ADMITTING PHYSICIAN:  Dr. Lindie Spruce.   DISCHARGING PHYSICIAN:  Dr. Janee Morn.   No operating surgeon was needed.   CONSULTS:  Dr. Oralia Rud with cardiology.   REASON FOR ADMISSION:  Noah Lewis is a 60 year old gentleman with a  week's history of abdominal pain.  He says that over the past week this  pain progressively began to worsen.  He states that on the day of  admission it became intolerable and therefore he presented to the  emergency room.  He does not have any previous history of abdominal  pain.  Once in the emergency room a CT scan was done which demonstrated  acute diverticulitis with microperforation and a possible early  formation of an abscess.  At this time a white count of 16,600 was  noted.  He is also noted to have chronic renal insufficiency and  therefore his creatinine was found to be 2.04 with a GFR of 41.  At this  time, surgery was consulted.  On physical exam the patient's abdomen  appeared to be distended with good bowel sounds.  He was tender in the  lower portion of his abdomen below the umbilicus and toward the pelvis  with some voluntary guarding.  There was no rebounding present.  There  was a tap tenderness in that area.  At that time the patient was  admitted and a cardiology consultation was requested due to the  patient's extensive cardiac history.  He was also started on IV Invanz  for antibiotic therapy.  Cardiac clearance was also requested in case  the patient needed urgent operation while in the hospital or if elective  surgery was needed in the future it would already be on the chart.   ADMITTING DIAGNOSES:  1. Acute diverticulitis with microperforation.  2. Uncontrolled hypertension.  3. Coronary artery disease status post myocardial infarction in 1986.  4. History of diabetes type 2.  5. History of peripheral vascular disease.  6. History of hyperlipidemia.  7. History of polysubstance abuse.   HOSPITAL COURSE:  On hospital day #1 the patient was still having lower  mid quadrant abdominal pain.  He states that at this time he is only  slightly better than before his admission.  On physical exam his abdomen  was soft yet still distended with decreased bowel sounds.  At this time  he was still having some mild guarding but was not having any rebound  tenderness.  At this time a repeat CBC was done which showed his white  count had only dropped to 15,600.  Continued bowel rest as well as IV  Invanz was recommended.  The patient also that morning had a low blood  glucose reading and therefore his insulin was held and  sliding scale  insulin protocol was ordered.  Also on hospital day #1, cardiology did  give clearance for the patient to have urgent or elective surgery to be  done.  By hospital day #2 the patient was still complaining of pain but  also was stating that he was very, very hungry.  He did state that he  had a stash of cookies and doughnuts in his bedside table that he was  threatening to eat if we did not give him food.  The reason for not  being able to give the patient regular food at this time was explained  and the patient said that he understood this and said that he would not  eat cookies and doughnuts at this time.  On exam his abdomen was still  distended and still quite tender in his left lower quadrant and  suprapubic region with still some mild guarding.  However, his exam was  somewhat better than the day before.  He at this time was continued on  n.p.o. status and bowel rest.  Pincus Sanes was still continued and labs were  to be checked on the  following day.  By the next day the patient's white  count had come down to 12,100.  At this time the patient states that his  pain was getting less each day.  He once again complained of being  extremely hungry and threatened once again to have his cookies and  doughnuts.  However, eventually he was told that he would be progressed  to a clear liquid diet that day and once again backed down from eating  his cookies and doughnuts.  He also on this day had two bowel movements.  On exam, he was still soft with less distention and mild tenderness in  his left lower quadrant.  Today there was no guarding.  At this time he  was advanced to a clear liquid diet, his Pincus Sanes was continued, and  repeat labs were also ordered.  By hospital day #4 the patient's white  count had normalized to 9700.  The patient today states that he has no  pain with eating clear liquids.  He also states at this time he does not  have any nausea and vomiting.  On exam, the patient is found to be soft,  nontender, with good bowel sounds.  At this time he was advanced to full  liquid diet and his antibiotics were continued.  A nutritional consult  was also ordered at this time to educate the patient on a low residue  diet as well as a diverticulosis diet to help prevent potentially any  further episodes of diverticulitis.  By hospital day #5 the patient was  tolerating a full liquid diet and at this time was adamant that he  needed to be discharged now so that he could go to lawyer's appointment  at 11 a.m. this morning.  I told the patient that we were going to  advance him to a low residue diet and that after lunch he could be  discharged.  However, this was not good enough for the patient and  therefore I contacted Dr. Janee Morn and discussed this with him.  He  stated that if the patient was not having any more pain, had no fevers,  and was tolerating his full liquid diet and had a normal white blood  cell count that  the patient could be discharged home.  Once discharged  home the patient needed to start a low  residue diet and progressively up  over the next several weeks to a diverticulosis diet.  The patient was  in agreement with this and because his abdomen was completely benign at  this point, the patient was allowed to be discharged home.   DISCHARGE DIAGNOSES:  1. Acute diverticulitis with microperforation.  2. Chronic renal insufficiency.  3. Diabetes mellitus type 2.  4. Leukocytosis, which at this time had resolved.  5. Hypertension.  6. Polysubstance abuse.   DISCHARGE MEDICATIONS:  The patient was told to continue all of his home  medications which include:  1. Imdur 60 mg  2. Hydralazine 100 mg.  3. Labetalol 40 mg.  4. Norvasc 10 mg.  5. Lasix 40 mg.  6. K-Dur 40 mEq.  7. Zocor 40 mg.  8. Nitroglycerin 0.21 mg.  9. Aspirin 325 mg.  10.Insulin 70/30, 15 units.   He is also told at this time that he needed to be put on Cipro 500 mg  one tablet p.o. b.i.d. for 7 days as well as Flagyl 500 mg one p.o.  t.i.d. for 7 days.   DISCHARGE INSTRUCTIONS:  The patient currently does not work and  therefore does not need a work note.  He is told to maintain a low  residue diet for the next 2 weeks and then progress to a diverticulosis  high fiber diet to hopefully help prevent further episodes of  diverticulitis.  At this time he has no activity restrictions.  He has  no restrictions on showering or bathing as well as lifting or driving.  He is informed also at this time to call our office if he begins to have  worsening abdominal pain or is unable to tolerate a low-residue diet  once at home.  He was also given instructions on how to take his Cipro  and Flagyl.  He is also told that he needs to continue this for 7 days.  Finally, he is told to return to either Dr. Lindie Spruce or Dr. Janee Morn at our  office for follow-up in 2-3 weeks.      Letha Cape, PA      Gabrielle Dare. Janee Morn,  M.D.  Electronically Signed    KEO/MEDQ  D:  11/27/2007  T:  11/28/2007  Job:  [id number]   cc:   Cherylynn Ridges, M.D.  Darryl D. Oralia Rud, MD  Madolyn Frieze Jens Som, MD, [facility]

## 2011-02-05 NOTE — Discharge Summary (Signed)
NAME:  Noah Lewis, Noah Lewis NO.:  [id number]   MEDICAL RECORD NO.:  [id number]          Lewis TYPE:  INP   LOCATION:  3714                         FACILITY:  [facility]   PHYSICIAN:  Noralyn Pick. Eden Emms, MD, FACCDATE OF BIRTH:  17-Mar-1951   DATE OF ADMISSION:  10/26/2007  DATE OF DISCHARGE:  11/01/2007                         DISCHARGE SUMMARY - REFERRING   PRIMARY CARE PHYSICIAN:  Noah Lewis does not have a primary care  physician, he will be following up with his primary cardiologist, Dr.  Jens Som.   DISCHARGE DIAGNOSES:  1. Hypertensive urgency.  2. Hyperglycemia with elevated hemoglobin A1c.  Evidence of poorly      controlled diabetes.  3. Chest discomfort with known history of myocardial infarction with      negative stress Myoview.  4. Hypokalemia.  5. Renal insufficiency.  6. Tobacco use.  7. Noncompliance with medications and diet, medical instructions.  8. Hyperlipidemia.  9. Diastolic congestive heart failure secondary to #1.  History as      previously.   SUMMARY OF HISTORY:  Noah Lewis is a 60 year old male who presents with  shortness of breath associated with chest discomfort, particularly with  exertion.  He describes a pressure discomfort with occasional stabbing  sensations in his anterior chest radiating down his left arm.  Nitroglycerin relieves Noah discomfort.  He has not taken any blood  pressure medications in Noah past 4 months.  He has a history of coronary  artery disease with myocardial infarction in Noah 80s; however,  catheterization in 2004 showed an EF of 40-45% without significant  coronary artery disease or outflow obstruction.  He also has a history  of near-syncope and his family insisted that he come to Noah emergency  room for evaluation.  It is noted that he has been noncompliant with  diet, continues to smoke and use alcohol.  History is also notable for a  history of polysubstance abuse, but he states that he has not used in a  long time.   LABORATORY:  Admission H and H 12.7 and 38.5, normal indices.  Platelets  227, WBCs 8.4.  On admission, sodium was 145, potassium 3.0, BUN 19,  creatinine 1.93, glucose 119.  Normal LFTs.  Potassium continued to  remain on Noah low side throughout admission.  At Noah time of discharge,  Noah last chemistry on Noah 7th was 139, potassium 3.3, BUN 25, creatinine  2.4, glucose 134.  Hemoglobin A1c was elevated 6.6.  CK MBs relative  indices were unremarkable x3.  Troponins were slightly abnormal at 0.12,  0.11 and 0.09.  BNP on admission was 871.  On Noah fifth, his last BNP  was 660.  Fasting lipids showed a total cholesterol of 99, triglycerides  112, HDL 21, LDL 56.  TSH 2.829.  B-12 541.  Folate 13.5, iron 28,  ferritin 56, total protein 7.0.  Protein electrophoresis showed a serum  albumin at 51.6, alpha-1 6.4, alpha-2 13.8 and beta at 7.6.  Drug screen  was unremarkable.  Urine detected albumin, alpha-1, alpha-2, beta and  gamma.  Free kappa light chains were elevated at 10.10.  Free lambda  light chains were elevated at 120.  Urinalysis was positive with 30  mg/dL of protein.  Urine culture was unremarkable.  Chest x-ray on Noah  2nd showed CHF.  Repeat chest CT on Noah 7th showed borderline cardiac  enlargement, small pericardial effusion, mediastinal lymph nodes that  were borderline, 2 small pulmonary nodules.  Recommend followup  noncontrast CT in 6 months.  No endobronchial changes.  Small bilateral  pleural effusions in areas of subpleural atelectasis.  No pulmonary  edema.  Adenosine Myoview imaging showed EF of 39%, global myocardial  hypokinesis.  No ischemia.  Echocardiogram on Noah 2nd showed an ejection  fraction of 60%.  No wall motion abnormalities, moderate LVH, mild AI,  mild MR, marked left atrial enlargement, RVH, small pericardial  effusion.   HOSPITAL COURSE:  Noah Lewis was admitted by Dr. Jens Som to [facility], placed on IV heparin.   Echocardiogram was performed.  He was  placed on various medications to improve his blood pressure; on  admission, it was 202/133.  Dr. Jens Som followed him throughout Noah  majority of his admission and continued to adjust his medications.  Tobacco cessation consult was performed on February 3.  Progression  nurse also assisted with needs.  Noted that his blood pressure was  difficult to control.  Dr. Jens Som wished to do a Myoview given his  symptoms and history.  However, his blood pressure precluded that until  later during his hospital stay.  Dr. Jens Som noted that he would avoid  ACE inhibitors for now until it was clear that his renal function is  stable.  He would not add clonidine given his noncompliance and  potential for rebound if suddenly discontinued.  He also felt that he  would need renal Dopplers as an outpatient.  He also wished to have  stool guaiacs; however, during his hospitalization, it is not clear if  Noah Lewis had any bowel movements.  Nutrition consult was also  performed to remind Noah Lewis in regards to proper diet given his  diagnosis of hypertension and diabetes.  Potassium continued to be  supplemented throughout his admission.  Nursing noted in Noah chart on  February 8 that Noah Lewis intentionally is not using Noah bathroom to  have a bowel movement because he refuses to save Noah stool for guaiac.  Dr. Eden Emms on Noah 8th noted that Noah Lewis's blood pressure had  improved to Noah 120s/80s and felt that he could be discharged home.  Dr.  Eden Emms had reviewed his CT prior to discharge.   DISPOSITION:  Noah Lewis is discharged home.   DISCHARGE MEDICATIONS:  I have rewritten medications and given him  prescriptions in Noah generic form for all Noah following:  1. Imdur 60 mg daily.  2. Hydralazine 100 mg four times a day.  3. Labetalol 40 mg b.i.d.  4. Norvasc 10 mg daily.  5. Lasix 40 mg daily.  6. K-Dur 40 mEq daily.  7. Zocor 40 mg q.h.s.  8.  Nitroglycerin 0.4 p.r.n.  9. He is also advised to begin aspirin 325 mg daily.   DISCHARGE INSTRUCTIONS:  He is asked to obtain a primary care physician,  begin daily weights, maintain a blood pressure diary.  To bring all  weights, blood pressure diary and medications to all appointments.  He  was advised no smoking.   DISCHARGE ACTIVITIES:  Activity is not restricted.   WOUND CARE:  Not applicable.   DISCHARGE DIET:  He  was asked to maintain low-sodium, heart-healthy  diet.   DISCHARGE FOLLOWUP:  He was asked to call Dr. Jens Som for a 2-week  followup appointment.   DISCHARGE TIME:  45 minutes.      Joellyn Rued, PA-C      Noralyn Pick. Eden Emms, MD, [facility]  Electronically Signed    EW/MEDQ  D:  11/01/2007  T:  11/02/2007  Job:  [phone]

## 2011-02-05 NOTE — Op Note (Signed)
NAME:  Noah Lewis, Noah Lewis NO.:  [id number]   MEDICAL RECORD NO.:  [id number]          PATIENT TYPE:  AMB   LOCATION:  NESC                         FACILITY:  [facility]   PHYSICIAN:  Mark C. Noah Lewis, M.D.  DATE OF BIRTH:  07-11-1951   DATE OF PROCEDURE:  05/11/2007  DATE OF DISCHARGE:                               OPERATIVE REPORT   PREOPERATIVE DIAGNOSIS:  Left hydrocele.   POSTOPERATIVE DIAGNOSIS:  Left hydrocele.   PROCEDURE PERFORMED:  Hydrocelectomy.   SURGEON:  Mark C. Noah Lewis, M.D.   ASSISTANT:  Noah Lewis, M.D.   ANESTHESIA:  General.   ESTIMATED BLOOD LOSS:  100 mL.   DRAINS:  Left hemiscrotal Penrose.   INDICATIONS FOR PROCEDURE:  [name] is a 60 year old male who has  had a prior right hydrocele repair.  He now has a very large left  hydrocele.  He desires operative management, as this causes him pain and  discomfort with ambulation -- resulting in a circumlocutory gait.   DESCRIPTION OF PROCEDURE IN DETAIL:  The patient was brought to the  operating room.  He was identified by his armband.  Informed consent was  verified and preoperative time-out was performed.  After the successful  induction of general anesthesia, the patient was moved to the supine  position.  The genitalia were shaved, prepped and draped in the usual  fashion.  The surgeon was gowned and gloved.  A vertical incision was  made in the left hemiscrotum, measuring approximately 10 cm (as this was  a very large hydrocele).  Blunt and sharp dissection was used to incise  Dartos fascia and get down to the level of the hydrocele.  This had a  very thick rind.  We dissected the layers of the rind off of the  hydrocele.  Once we had got it to the finished, we were able to deliver  it from the hemiscrotum into the operative field.  It was then entered  sharply and drained.  Drainage was 350 cc of straw-colored urine.  We  then opened the hydrocele sac longitudinally, down to the  level of the  spermatic cord.  It was then inverted.  We noted a testis with some  inflammatory changes around it.  There was also a second pocket on the  posterior wall of the hydrocele, which was opened sharply and drained.  This was then marsupialized.   We then performed a subtotal excision of the hydrocele sac.  Again, this  had a very thick rind.  The hydrocele was then closed in a bottle-neck  fashion.  Great care was taken not to entrap the spermatic cord.  The  remaining edges were then closed with a running 3-0 chromic whipstitch.   We then amputated some of the excess capsule along the sidewall.  The  intrascrotal septum was then reapproximated with a running 3-0 chromic.  The wound was vigorously irrigated.  We liberally used Bovie cautery to  insure excellent hemostasis.  Because of the inflammatory nature and the  large dead space, it was felt a drain was prudent.  A Penrose drain was  brought out through a stab incision and secured in the usual fashion.  The testis was then delivered into the hemiscrotum. The dartos was  closed with running 3-0 chromic.  The skin was closed with running,  locking 3-0 chromic.   At this time the procedure was terminated.  The patient tolerated the  procedure well and there were no complications.   Dr. Ihor Lewis was the attending primary responsible physician.  He  was present and participated in all aspects of the procedure.   DISPOSITION:  The patient was extubated uneventfully and transported  safely to the post-anesthesia care unit.  He was given a prescription  for Tylox and Keflex.     ______________________________  Noah Schools, MD      Noah Lewis. Noah Lewis, M.D.  Electronically Signed    Noah Lewis/MEDQ  D:  05/11/2007  T:  05/11/2007  Job:  [id number]

## 2011-02-05 NOTE — H&P (Signed)
NAME:  Noah Lewis, HALLS NO.:  [id number]   MEDICAL RECORD NO.:  [id number]          PATIENT TYPE:  INP   LOCATION:  5158                         FACILITY:  [facility]   PHYSICIAN:  Cherylynn Ridges, M.D.    DATE OF BIRTH:  [birth year]   DATE OF ADMISSION:  11/22/2007  DATE OF DISCHARGE:                              HISTORY & PHYSICAL   IDENTIFICATION AND CHIEF COMPLAINT:  The patient is a 60 year old  gentleman with a week's history of abdominal pain, worsening today  requiring for him to come into the emergency room.   HISTORY OF PRESENT ILLNESS:  The patient states that his pain has been  going on for a week, progressively getting worse.  He got to where it  was intolerable today where he came to the emergency room.  No previous  history of abdominal surgery.  CT scan was done which demonstrated acute  diverticulitis with a microperforation and a possible early formation of  an abscess.  Surgical consultation was obtained.   MEDICATIONS:  Zocor, hydralazine, Labetalol, Norvasc, furosemide 40 mg  daily, potassium replacement.   ALLERGIES:  He is allergic to PENICILLIN.   PAST MEDICAL HISTORY:  Significant for  1. Severe hypertension that has been difficult to control.  2. Status post MI in the 6s.  3. Elevated cholesterol.  4. Congestive heart failure.  5. Chronic renal insufficiency not requiring dialysis.  6. Polysubstance abuse.  7. History of hyperglycemia and not requiring medical management.  8. Hypertension in the past, required admission in early February for      hypertensive crisis where he was admitted for about 10 days.   REVIEW OF SYSTEMS:  No fevers or chills, nausea, vomiting.  His last  bowel movement was today and was normal.  It did not relieve his pain.   PHYSICAL EXAMINATION:  GENERAL:  He looks to be in only mild distress.  VITAL SIGNS:  He is afebrile at 97.6, pulse of 96, blood pressure  151/97, respirations of 18.  GENERAL APPEARANCE:  He  is normocephalic and atraumatic and anicteric.  NECK:  Supple.  There are no bruits.  He has no cervical adenopathy.  LUNGS:  Clear to auscultation.  CARDIAC:  Regular rhythm and rate with no murmurs.  ABDOMEN:  Distended with good bowel sounds.  He is tender in the lower  portion of the abdomen below the umbilicus and towards pelvis with some  voluntary guarding.  No rebound.  There is tap tenderness in that area.  RECTAL:  Exam was unremarkable.  No gross blood.  NEUROLOGICAL:  Cranial nerves II-XII grossly intact.  He has got  symmetrical and +2 bilateral deep tendon reflexes at the patella and  biceps tendons.   LABORATORY STUDIES:  His white count is 16.6000 with a left shift.  He  has a hemoglobin of 12.4, hematocrit of 37.  His electrolytes are normal  with potassium of 3.67, BUN of 18, creatinine of 2.  LFTs are normal.  Lipase is normal.  PT and INR were not checked.   IMPRESSION:  Acute diverticulitis with microperforation.  PLAN:  1. Admit him and get cardiology consultation.  2. We will start him on IV Invanz for antibiotic therapy.  Possibility      for surgery exists, therefore I have requested cardiology try to      give [place] clearance prior to urgent operation if that is necessary      and then it will be on the chart in case he gets an elective      procedure.      Cherylynn Ridges, M.D.  Electronically Signed     JOW/MEDQ  D:  11/22/2007  T:  11/23/2007  Job:  [id number]

## 2011-02-05 NOTE — Consult Note (Signed)
NAME:  Noah Lewis, Noah Lewis NO.:  [id number]   MEDICAL RECORD NO.:  [id number]          PATIENT TYPE:  OIB   LOCATION:  1439                         FACILITY:  [facility]   PHYSICIAN:  Lindaann Slough, M.D.  DATE OF BIRTH:  June 14, 1951   DATE OF CONSULTATION:  DATE OF DISCHARGE:  05/22/2007                                 CONSULTATION   ADMISSION DIAGNOSIS:  Scrotal abscess.   DISCHARGE DIAGNOSIS:  Scrotal abscess.   PROCEDURE:  Scrotal exploration with incision and drainage of scrotal  abscess.   HISTORY OF PRESENT ILLNESS:  Noah Lewis is a 60 year old male with  history of hypertension and also recent incarceration.  He has had a  symptomatic hydrocele that was enlarging.  He underwent a bottle-neck  type hydrocele repair as an outpatient approximately 10 days ago.  The  patient returned with increased swelling and the purulent drainage.   PHYSICAL EXAMINATION:  GENITALIA:  He had fluctuance concerning for an  abscess in his left hemiscrotum.  He was admitted to the hospital as a  result of that.   HOSPITAL COURSE:  The patient was admitted to the hospital.  He was  taken to the operating room where he underwent incision and drainage of  scrotal abscess under general anesthesia.  Cultures were sent at that  time which grew out aerobic and anaerobic gram-positive cocci.  The  patient tolerated the procedure well.  His scrotum was left open and he  received normal saline, wet-to-dry Kerlix gauze dressing changes.  The  patient was afebrile and the remainder of his physical exam was  unremarkable.  Therefore, on May 22, 2007, the decision was made to  provide the patient with home health for dressing changes to be  performed b.i.d.  This was arranged through the Child psychotherapist  department.   DISCHARGE MEDICATIONS:  1. Clindamycin.  2. Colace.  3. Percocet.  4. Resume home medications.  See medicine record.   DIET:  Resume diet.   ACTIVITY:  He is to be up as  tolerated.   WOUND CARE:  He is to perform b.i.d., wet-to-dry, normal saline  moistened, Kerlix dressing changes to his left hemiscrotum twice daily.  He has been instructed to the depth and extent of his wound and the  importance of adequate packing as failure to do so could cause  reaccumulation of abscess and possible loss of the testis in the future.   SPECIAL INSTRUCTIONS:  The patient is instructed to call [phone], for  any concerning symptoms including, but not limited to fevers, chills,  uncontrolled pain, difficulty with dressing changes.  The patient  indicated he understood these instructions and was discharged home.      Terie Purser, MD      Lindaann Slough, M.D.  Electronically Signed    JH/MEDQ  D:  05/22/2007  T:  05/23/2007  Job:  [id number]

## 2011-02-08 NOTE — Op Note (Signed)
[facility]  Patient:    Noah Lewis, Noah Lewis Visit Number: [id number] MRN: [id number]          Service Type: MED Location: 1E 0101 01 Attending Physician:  Lorre Nick Proc. Date: 05/27/01 Admit Date:  05/27/2001                             Operative Report  PREOPERATIVE DIAGNOSIS:  Right postoperative hematoma/abscess status post hydrocele.  POSTOPERATIVE DIAGNOSIS:  Right postoperative hematoma/abscess status post hydrocele.  PROCEDURE PERFORMED:  Scrotal exploration with drainage of hematoma and abscess.  SURGEON:  Barron Alvine, M.D.  ANESTHESIA:  General.  INDICATIONS:  [name] is a 60 year old male.  In mid August, he underwent elective repair of a large right hydrocele.  Apparently the surgery was uncomplicated according to the patient and, initially, his scrotum decreased significantly in size.  It subsequently enlarged once again.  He began having more discomfort.  He has made at least one other trip to the [facility] Emergency Room and was given some antibiotics as well as a prescription.  He did not fill the prescription because of cost concerns.  He re-presented to the emergency room tonight with a markedly swollen right hemiscrotum. Previous ultrasound had shown significant fluid collection in the right scrotum with questionable abscesses.  On exam, I found the patient to have a rock-hard scrotum.  The scrotal wall itself was markedly thickened.  The incision showed some purulent drainage, and it appeared that the patient had a large hematoma which was potentially becoming infected with some early abscess formation and some purulent material.  We felt that for that reason, an exploration with an attempted drainage would be in the patients best interest.  Full and informed consent was obtained.  TECHNIQUE AND FINDINGS:  The patient was brought to the operating room where he had successful induction of general anesthesia.  The  majority of his previous scrotal incision was opened.  The scrotal skin was markedly thickened 3-4 times the normal thickness.  Some purulent material with early infected liquefied hematoma was encountered initially.  The majority of the enlargement of the scrotum was really due to marked enlargement and thickening of the scrotal wall.  There was also some firm, hard, nonliquiefied hematoma.  There were several small pockets where the hematoma had started to liquefy, and the material had a pus-like appearance.  This was cultured.  Although again, since he has had multiple antibiotics, it may very well be sterile.  We copiously irrigated the scrotum and attempted to break up any adhesions and to uncover any pockets of possible trapped purulence.  We then packed the hemiscrotum with Iodoform gauze, leaving the scrotal skin open to close by secondary intention.  The patient appeared to tolerate the procedure reasonably well. Lidocaine was instilled to help with postop pain management. Attending Physician:  Lorre Nick DD:  05/27/01 TD:  05/28/01 Job: [id number]

## 2011-02-08 NOTE — Cardiovascular Report (Signed)
NAME:  Noah Lewis, GRIGORIAN NO.:  [id number]   MEDICAL RECORD NO.:  [id number]                   PATIENT TYPE:  INP   LOCATION:  4734                                 FACILITY:  [facility]   PHYSICIAN:  W. Ashley Royalty., M.D.         DATE OF BIRTH:  [birth year]   DATE OF PROCEDURE:  08/11/2003  DATE OF DISCHARGE:                              CARDIAC CATHETERIZATION   HISTORY:  A 60 year old black male with an abnormal Cardiolite scan with an  EF of 43%, but no ischemia previously.  He presented with syncope and  atypical chest pain and is brought in at this time for evaluation of syncope  and recurrent chest pain.  Catheterization was advised to exclude coronary  artery disease in view of abnormal EKG.   COMMENTS ABOUT PROCEDURE:  The patient tolerated the procedure well without  complications.  The right femoral artery was entered using an anterior  needle wall stick.  He received labetalol 20 mg IV for control of blood  pressure.  Following the procedure, he had good hemostasis and peripheral  pulses noted.   HEMODYNAMIC DATA:  1. Aorta postcontrast:  157/92.  2. LV postcontrast:  157/8-18.   ANGIOGRAPHIC DATA:   LEFT VENTRICULOGRAM:  Performed in the 30-degree RAO projection.  The aortic  valve was normal.  The mitral valve was normal.  The left ventricle is mild  to moderately dilated.  There are increased trabeculations consistent with  ventricular hypertrophy noted.  The estimated ejection fraction is depressed  and is between 40 and 45%.  There is diffuse global hypokinesis noted.  There were no wall motion abnormalities consistent with a prior infarct.   Coronary arteries arise and distribute normally.  The system is co-dominant.  Left main coronary artery is normal.  Left anterior descending is a large  vessel that extends around the apex.  A diagonal branch arises and there is  no significant disease noted.   Circumflex coronary artery:   Co-dominant vessel which is moderately  tortuous.  Supplies a twin posterior descending artery.  There is also an  intermediate branch that arises which has two bifurcating distal branches  which are mildly tortuous.  There is no significant narrowings noted and the  circumflex appears normal.   Right coronary artery:  Ends in a single posterior descending artery which  is normal.   IMPRESSION:  1. Abnormal ventricular function with diffuse global hypokinesis with mild     reduction in left ventricular ejection fraction.  2. No significant coronary artery disease identified.  3. No significant outflow tract gradient noted.   RECOMMENDATIONS:  Good control of blood pressure with target blood pressure  of 120/80 in view of diabetes.  Needs good diabetic control and good  hypertension control.  Darden Palmer., M.D.    WST/MEDQ  D:  08/11/2003  T:  08/12/2003  Job:  [id number]   cc:   [facility] Teaching Service

## 2011-02-08 NOTE — Discharge Summary (Signed)
NAME:  Noah Lewis, Noah Lewis NO.:  [id number]   MEDICAL RECORD NO.:  [id number]                   PATIENT TYPE:  INP   LOCATION:                                       FACILITY:  [facility]   PHYSICIAN:  Laurell Roof, M.D.                DATE OF BIRTH:  08-Dec-1950   DATE OF ADMISSION:  11/07/2002  DATE OF DISCHARGE:  11/10/2002                                 DISCHARGE SUMMARY   DIAGNOSES:  1. Coronary artery disease.  2. Hypertension.  3. Alcohol intoxication and abuse.  4. Tobacco abuse.  5. History of medical nonadherence.   MEDICATIONS AT DISCHARGE:  1. Atenolol 50 mg p.o. q.d.  2. Aspirin 325 mg p.o. q.d.  3. Norvasc 10 mg p.o. q.d.  4. Lasix 40 mg p.o. q.d.   FOLLOW UP AND SPECIAL INSTRUCTIONS:  The patient was told to follow a low  salt diet. He was counseled with regards to continuing medical adherence  with his medications, not to consume any alcoholic beverages including beer,  wine or liquor, and to stop smoking. The patient had an appointment at the  outpatient clinic for a nurse to check his blood pressure and a BNP which  would be called to his primary care doctor on November 17, 2002 at 9 p.m. He  also had an appointment with Dr. Sharl Ma on March 1 at 10:45 p.m. at [facility]  outpatient clinic.   SPECIAL PROCEDURES DURING THIS ADMISSION:  The patient had a Cardiolite  which showed nonreversible ischemia. His ejection fraction was calculated at  43%. There were no stress-induced perfusion, and he had mild fixed thinning  of the inferior wall with hypokinesis.   CONSULTANTS:  Dr. Donnie Aho and smoking cessation consult.   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 He had a  catheterization; however, it is unclear when and where it was sent. The  patient has moved throughout different cities. The patient continues to  smoke in the present. He also mentions  that he has problems with adherence  with his medications. As a matter of fact, he has not been to his primary  care doctor in about a year due in part to his frequent relocations. The  patient had chest pain which started on the day prior to admission while he  was eating his lunch, but the pain was substernal and radiating in the mid  chest, 8/10 intensity, with some shortness of breath and diaphoresis. He  mentions it lasted about 10 to 15 minutes. He also reports having vomiting  early that morning. The patient reported this chest pain was on and off  associated with activity over the last couple of weeks; however, he did not  really want to come and seen anybody at that point. He mentions that this  pain is similar to the pain he had in the past with his MI. The patient  mentions that actually since he has been here in the hospital that his chest  pain has been resolved. He mentioned he tried sublingual nitroglycerin given  by EMS which helped with the pain.   PHYSICAL EXAMINATION:  VITAL SIGNS:  The patient's vital signs on  presentation were:  Pulse 68, blood pressure 173/107, temperature 97.0,  respirations 18, and O2 saturation was 97% on room air.  GENERAL:  The patient was in no acute distress, was alert and oriented x3.  HEENT:  Eyes were clear.  NECK:  Supple. No JVD. NO bruits.  RESPIRATIONS:  Bilateral clear to auscultation.  HEART:  Regular rate and rhythm. S1 and S2 audible. No murmurs, no gallops,  no rubs.  EXTREMITIES:  No lower extremity edema. Bilateral pedal pulses were palpated  and were 2+.  ABDOMEN:  Benign. Otherwise, everything was within normal limits.   LABORATORY DATA:  His initial set of labs:  He had a troponin of 0.04, CK of  89, and MB of 0.1. The chest x-ray was clear with no infiltrate and no acute  disease. He had an EKG which was 67 beats per minute. Normal sinus rhythm  with PVCs. T-wave inversions in leads I, aVL, V3, V6. He had minimal ST   elevation in V1 and V2 with LVH; comparing to an old EKG of 2004, the  significance is there is increase in his ST elevation or LVH in the T waves,  there is no T wave inversion in leads I and aVL .   His labs on presentation:  His sodium was 140, potassium 3.5, chloride 107,  bicarb 26, BUN 20, creatinine 1.6, glucose 165. He had an alkaline  phosphatase of 128, AST of 36, ALT 44. He had a hemoglobin of 14.0,  platelets 193, and white blood cell count of 8.2 with a MCV of 79.   HOSPITAL COURSE:  1. Chest pain. Due to the patient's history of CAD, he was initially on     aspirin, nitroglycerin, oxygen, and Lovenox. He was also initiated on a     beta blocker, given that were concerned about having an acute MI;     however, his EKG appears these changes were not dynamic, and chest pain     resolved on its own, and we followed three sets of cardiac enzymes which     were all negative. We consulted cardiology, and a Cardiolite was done     which showed an ejection fraction of 43% with diffuse hypokinesis;     however, no ischemia. It appeared that he had a mild fixed defect. It was     determined at that point that the patient was having angina and that this     would be better controlled with better control of his blood pressure. He     was also to continue the aspirin and continue on the beta blocker and on     a diuretic, and the patient was to continue being followed up by his     primary care physician. Smoking sensation was also emphasized as     something that would help him with his chest pain.  2. Hypertension. The patient did admit that he had been taking clonidine     regularly and that he had stopped it. His blood pressure was difficult to     control; however, slowly, we are obtaining better  control on a beta     blocker and ACE inhibitor and a diuretic. These medications were to be     titrated as an outpatient as he would tolerate. 3. Increase in his liver enzymes. It was  attributed to his known history of     alcohol abuse. The patient was counseled at length regarding this     problem.  4. The patient has some episodes of hypokalemia, actually had one episode of     hypokalemia with a low potassium of 3.4. We checked a magnesium which was     within normal limits, and we supplemented with potassium. He maintained a     normal potassium throughout hospitalization from there on.   DISCHARGE PHYSICAL EXAMINATION:  GENERAL:  On the day of discharge, the  patient had no complaints. His blood pressure was 160/90. His pulse was 60.  His temperature 97.1. He was in no acute distress.  CARDIOVASCULAR:  His cardiovascular heart was regular rate and rhythm. No  murmurs, no gallops, no rubs.  LUNGS:  His lungs were bilaterally clear to auscultation with good air  movement.  ABDOMEN:  The abdomen was benign.  EXTREMITIES:  There was no clubbing, cyanosis, or edema of his extremities.    DISCHARGE LABORATORY DATA:  Hemoglobin of 13.5 and MCV of 78.4, platelets  191. Sodium of 141, potassium 3.6, chloride 104, bicarb of 27, and a glucose  of 172.   DISPOSITION:  The patient was to follow up with his primary care physician  as mentioned above.                                                Laurell Roof, M.D.    LC/MEDQ  D:  05/10/2003  T:  05/11/2003  Job:  [id number]   cc:   W. Ashley Royalty., M.D.  [address], [address]  [place]  [place] [address]  Fax: [phone]

## 2011-02-08 NOTE — Discharge Summary (Signed)
[facility]  Patient:    Noah Lewis, Noah Lewis Visit Number: [id number] MRN: [id number]          Service Type: PSY Location: 500 0503 02 Attending Physician:  Jeanice Lim Dictated by:   Jeanice Lim, M.D. Admit Date:  07/26/2001 Discharge Date: 08/03/2001                             Discharge Summary  IDENTIFYING DATA:  This is a 60 year old African-American male voluntarily admitted complaining of voices telling him "go and get clean."  The patient had relapsed on alcohol, cocaine and marijuana and found his wife of eight months sleeping with another man.  He reported homicidal and suicidal thoughts.  MEDICATIONS:  Long history of noncompliance.  Has been on medications for hypertension, heart disease.  DRUG ALLERGIES:  PENICILLIN.  PHYSICAL EXAMINATION:  Essentially within normal limits.  Vital signs stable. Afebrile.  Neurologically nonfocal.  LABORATORY DATA:  Routine admission labs were essentially within normal limits including a negative urinalysis, a drug screen positive for cocaine, marijuana, a normal thyroid profile and normal CBC with differential and CMET.  MENTAL STATUS EXAMINATION:  This is a casually dressed, fully alert, African-American male in no acute distress, maintaining good eye contact without tremor.  Speech within normal limits, somewhat vague and evasive but more open as rapport is developed then speech within normal limits again. Mood mild, depressed.  Affect slightly restricted.  Thought process goal directed.  Thought content negative for psychotic symptoms.  Positive homicidal ideation as well as suicidal ideation with no plan.  ADMISSION DIAGNOSES: Axis I:    1. Alcohol abuse.            2. Cocaine abuse.            3. Cannabis abuse.            4. Depression not otherwise specified. Axis II:   None. Axis III:  1. Anemia.            2. Hypertension.            3. Coronary artery disease by  history. Axis IV:   Moderate (related to primary support and history of substance            abuse). Axis V:    38/78.  HOSPITAL COURSE:  The patient was admitted with routine p.r.n. medications and he was started on blood pressure medicine, Lotensin, Zyprexa for agitation and adjunctive antidepressant in addition to Zoloft.  The patient was placed on Lopressor and a brief phenobarbital taper as well as an iron pill.  The patient tolerated medications well.  CONDITION ON DISCHARGE:  Markedly improved.  He reported stable mood, euthymic.  Affect brighter.  Thought process goal directed.  Thought content negative for dangerous ideation and no psychotic symptoms.  Reporting motivation to be abstinent and follow up with the treatment plan.  Verbally acknowledging the importance of compliance with medications and sobriety.  FOLLOW-UP:  [facility] for blood pressure check and [facility] for follow-up of substance abuse treatment and psychiatric medication management.  DISCHARGE MEDICATIONS:  1. Lotensin 20 mg q.a.m.  2. Trazodone 100 mg, 1/2 q.h.s.  3. Lasix 40 mg q.a.m.  4. Clonidine 0.2 mg twice a day.  5. Iron pills three times a day.  6. Lopressor 50 mg b.i.d.  7. Norvasc 10 mg q.a.m.  8. Aspirin 325 mg q.a.m.  9. Protonix 40 mg q.a.m. 10. Zoloft 100 mg q.a.m. 11. Zyprexa 7.5 mg q.h.s.  DIET:  He was put on a low-sodium, heart-healthy diet.  DISCHARGE DIAGNOSES: Axis I:    1. Alcohol abuse.            2. Cocaine abuse.            3. Cannabis abuse.            4. Depression not otherwise specified. Axis II:   None. Axis III:  1. Anemia.            2. Hypertension.            3. Coronary artery disease by history. Axis IV:   Moderate (related to primary support and history of substance            abuse). Axis V:    Global Assessment of Functioning on discharge 55. Dictated by:   Jeanice Lim, M.D. Attending Physician:   Jeanice Lim DD:  09/09/01 TD:  09/09/01 Job: [id number]

## 2011-02-08 NOTE — H&P (Signed)
[facility]  Patient:    Noah Lewis, Noah Lewis Visit Number: [id number] MRN: [id number]          Service Type: PSY Location: 500 0503 02 Attending Physician:  Jeanice Lim Dictated by:   Young Berry Scott, N.P. Admit Date:  07/26/2001 Discharge Date: 08/03/2001                     Psychiatric Admission Assessment  DATE OF ASSESSMENT:  July 26, 2001 at 10:30 a.m.  I note that this transcription has already been performed and we already have a number for it so, therefore, I am going to stop and we will just retrieve the previous dictation. Dictated by:   Young Berry Scott, N.P. Attending Physician:  Jeanice Lim DD:  09/09/01 TD:  09/10/01 Job: [id number]

## 2011-02-08 NOTE — Consult Note (Signed)
NAME:  Noah Lewis, Noah Lewis NO.:  [id number]   MEDICAL RECORD NO.:  [id number]                   PATIENT TYPE:  INP   LOCATION:  4734                                 FACILITY:  [facility]   PHYSICIAN:  W. Ashley Royalty., M.D.         DATE OF BIRTH:  January 07, 1951   DATE OF CONSULTATION:  08/10/2003  DATE OF DISCHARGE:                                   CONSULTATION   This 60 year old black male is seen at the request of the internal medicine  teaching service for evaluation of chest pain and syncope.  The patient is a  poor historian.  He has a long history of cigarette abuse and alcohol abuse  and substance abuse including cocaine in the past.  He reports having had a  myocardial infarction in 1986 and was hospitalized at [facility].  He  reports having had a catheterization in 1996 and was not told that there was  any problem there.  He was admitted with uncontrolled hypertension as well  as chest pain which is somewhat atypical and had a myocardial infarction  ruled out in February of this year.  At that time, he had uncontrolled  hypertension.  He had a Cardiolite scan done that showed an EF of 43% with  diffuse hypokinesis but no significant ischemia, had a fixed inferior defect  that was mild.  It was thought that he might have angina.  He would be  better controlled with better control of his blood pressure.  He continued  on aspirin, beta blocker, and diuretic and was advised to follow up with his  primary care doctor.  He was advised to stop smoking.  He has continued to  smoke since then but states that he has been taking his blood pressure  medicine.  He has continued to have episodic chest discomfort that he  describes as needles sticking in him and occasional tightness.  It is not  related to exertion; it is not related to food or change in body position.  It will last for variable periods of time, sometimes less than two minutes  and sometimes  as long as 10 minutes.  He normally rides a bicycle for  transportation and has some dyspnea on exertion.  He had a large glass of  peach wine when he visited his brother and rode home on his bicycle.  After  arriving home, he stopped to play with his 88-month-old daughter on the sofa  and had a syncopal episode after standing up.  He was unconscious for 1-1.5  minutes but had no seizure or incontinence.  He was not nauseated prior to  that and had no memory of this.  He was brought to the emergency room by  EMS.  He had a similar episode approximately two months ago while riding his  bicycle and felt a pressure in his chest prior to that.   He was admitted last evening and  had a myocardial infarction ruled out by  serial enzymes.  Drug screen was negative.  Glucose was 260, potassium was  2.9.  Liver enzymes were mildly elevated.  Urine glucose was 250.  I was  asked to consult on the patient when he had recurrent chest discomfort this  morning.   PAST MEDICAL HISTORY:  1. Longstanding hypertension.  2. Previous coronary artery disease.  3. Previous hydrocele of the testicle.  4. Previous history of depression with suicide attempt several years ago.  5. He also has elevated liver enzymes previously and hypertriglyceridemia.   ALLERGIES:  PENICILLIN.   MEDICATIONS:  1. Lasix 40 b.i.d.  2. Atenolol 50 daily.  3. Norvasc 10 daily.  4. Aspirin daily.   FAMILY HISTORY:  Mother is a diabetic and has had a heart attack and is  living at age 20.  Father died in his 49's following a motor vehicle  accident.  He has a brother with diabetes.   SOCIAL HISTORY:  He is currently on disability, has an 11th grade education,  formerly did various work.  He is currently separated but has a live-in  girlfriend of two months and has a 53-month-old daughter by this woman.  He  was incarcerated for drug sale and assault previously.  He continues to  drink occasionally and has occasional chest  discomfort.  He also uses  occasional tobacco.   REVIEW OF SYMPTOMS:  He has had some chills and has had significant fatigue.  He has no eye, ear, nose, or throat problems.  He does have dyspnea on  exertion as well as some syncope.  He has had some dysuria and urinary  frequency, has no nausea, vomiting, hematochezia, diarrhea, or constipation.  He has significant heartburn; however, he has had significant weakness,  dizziness, and imbalance.  Has severe low back pain.  He has a prior history  of depression and suicidal ideation but none recently.  Other than is noted,  the remaining review of systems is unremarkable.   PHYSICAL EXAMINATION:  GENERAL:  He is a pleasant, black male, appearing  slightly older than stated age.  He was balding.  VITAL SIGNS:  His blood pressure was currently 140/80.  Pulse is currently  70.  SKIN:  Warm and dry.  ENT:  EOMI, PERRLA, C&S clear.  Fundi not seen.  Pharynx negative.  NECK:  Supple, no masses, JVD, thyromegaly, or bruits.  LUNGS:  Clear to A&P.  CARDIAC:  Normal S1, S2.  No S3 or S4 or murmur.  ABDOMEN:  Soft, nontender.  Femoral pulses 2+.  Peripheral pulses are 2-3+.  There is no trauma.  He is alert and oriented.   LABORATORY WORK:  Recorded in the chart.  She has a glucose of 260,  potassium 2.9, alk. phos. 130.  SGOT 39, GPT 51.  EKG shows LVH with ST-T  wave changes unchanged from previous.  Chest x-ray shows cardiomegaly.   IMPRESSION:  1. Chest discomfort with some typical, other atypical features with a     previous abnormal Cardiolite earlier this year.  He does have a possible     history of a myocardial infarction and coronary artery disease would     certainly be high on the list.  2. Hypertensive heart disease with abnormal EKG.  3. Syncope which is likely due to vasovagal reaction by history.  4. Probable new diabetes. 5. History of polysubstance abuse with alcohol, cocaine, and marijuana.  6. Tobacco abuse.  7. Elevation  of liver enzymes, questionable chronic hepatitis.  8. Hypokalemia - probably corrected.   RECOMMENDATIONS:  The patient has chest discomfort with some typical, other  atypical features, has a markedly EKG and a prior Cardiolite which was  abnormal.  He also has a probable history of a myocardial infarction in the  past but has not been documented in the chart.  It is recommended that he  have a cardiac catheterization to exclude significant coronary artery  disease.  The procedure was discussed with him fully, including the risks of  myocardial infarction, death, stroke, bleeding, arrhythmia, dye allergy, or  renal failure, and he  understands and is willing to proceed.  He also will need to have an  echocardiogram.  I do not think he has hypertrophic cardiomyopathy on  examination.  Did not hear a significant murmur.  Further recommendations  will follow the above testing.                                               Darden Palmer., M.D.    WST/MEDQ  D:  08/10/2003  T:  08/10/2003  Job:  [id number]   cc:   Madaline Guthrie, M.D.  [address]  [place] [address]  Fax: [phone]   Talmage Coin, M.D.  [address]  [place]  [place] [address]  Fax: [phone]
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
## 2011-02-08 NOTE — H&P (Signed)
[name]  Patient:    Noah Lewis, Noah Lewis Visit Number: [id number] MRN: [id number]          Service Type: MED Location: 1E 0101 01 Attending Physician:  Lorre Nick Dictated by:   Barron Alvine, M.D. Admit Date:  05/27/2001   CC:         Mark C. Vernie Ammons, M.D.   History and Physical  ADMITTING DIAGNOSIS:  Scrotal abscess/hematoma.  HISTORY OF PRESENT ILLNESS:  The patient is a 60 year old, African-American male.  He states that he intermittently lives in [place] and at times in [place].  Approximately 3-4 weeks ago he states he underwent elective scrotal surgery on the right hemiscrotum in [place] at [facility]. Based on his description, it appears that this was probably a hydrocele repair.  He reports that his scrotum was markedly swollen, and had been for a long time.  The doctor performed his surgery and apparently things were uncomplicated.  He has had initial improvement in the size of his scrotum but subsequently it increased in size.  He began having drainage and noticed foul-smelling drainage from his scrotum.  He also had some chills at home. He apparently was seen in the [facility] Emergency Room two nights ago, and the urologist on-call was contacted.  A scrotal ultrasound showed a significant fluid collection around the right testicle with question abscess.  The patient was given a prescription for antibiotics and told to follow up as an outpatient.  He reports he cannot afford the antibiotics and did not take them. He continued to have increased scrotal swelling with pain and drainage. He represented to the emergency room and I was contacted this evening.  We have attempted to obtain records from the old hospital but do not have them at this time.  PAST MEDICAL HISTORY:  The patients past medical history is not entirely clear. He reports that he is on medicines for hypertension but does not know the medications.   He also reports that he is on a water pill and possibly something for cholesterol.  He is unaware of any history of diabetes or obvious known heart disease.  He reports no pulmonary problems.  He states he has had no liver problems or hepatitis in the past.  He has not had other significant problems with infections.  ALLERGIES:  He does report an allergy to PENICILLIN.  PHYSICAL EXAMINATION:  GENERAL APPEARANCE:  On examination, he is a well-developed, well-nourished male.  He was nontoxic and he is afebrile.  VITAL SIGNS:  His blood pressure is 163/97 with a pulse of 83.  GENITALIA:  Examination of the penis shows no significant abnormalities.  His right hemiscrotum is massively enlarged and significantly indurated.  The incision itself shows some purulent drainage.  The scrotum itself is very firm.  The underlying testicle is not palpable.  The left testicle is unremarkable.  LABORATORY DATA:  White blood cell count from two days was mildly elevated. The rest of his labs are unremarkable and chest x-ray was clear.  ECG showed nonspecific T wave abnormality.  ASSESSMENT:  Massive right scrotal hematoma.  This appears to be a postoperative hematoma.  It appears that the patient did have a massive hydrocele and he reports that his scrotum was even more swollen preoperatively.  There is obviously some purulent material draining and a very foul smell from his scrotum. I believe that he has an infected hematoma and does require drainage of this.  This is exceedingly unlikely to resolve on  its own. I explained to the patient that open up the incision will preclude our ability to close it and it could be several weeks or even months before his scrotum heals.  He may chose to go back to [place] to have this evaluated by the urologist who did the procedure.  We will obtain interoperative cultures.  The patient has, however, already received several injections of Tequin, both during  his previous visit, as well as today and that may sterilize the cultures.  The material itself, however, is clearly purulent in nature and requires drainage.  I do not think orchiectomy will be necessary, but I did tell the patient that depending on the situation, it is possible that that could be necessary, although based on the ultrasound findings it does not appear to be the case.  We will at least keep him overnight for observation. Dictated by:   Barron Alvine, M.D. Attending Physician:  Lorre Nick DD:  05/27/01 TD:  05/28/01 Job: [id number]
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
## 2011-06-14 LAB — POCT CARDIAC MARKERS
CKMB, poc: 1.7
Myoglobin, poc: 120
Operator id: 198171
Troponin i, poc: 0.17 — ABNORMAL HIGH

## 2011-06-14 LAB — BASIC METABOLIC PANEL
BUN: 25 — ABNORMAL HIGH
BUN: 25 — ABNORMAL HIGH
BUN: 26 — ABNORMAL HIGH
CO2: 26
CO2: 26
CO2: 26
CO2: 27
Calcium: 8.2 — ABNORMAL LOW
Calcium: 8.3 — ABNORMAL LOW
Chloride: 104
Chloride: 105
Chloride: 105
Chloride: 110
Creatinine, Ser: 1.98 — ABNORMAL HIGH
Creatinine, Ser: 2.1 — ABNORMAL HIGH
Creatinine, Ser: 2.4 — ABNORMAL HIGH
GFR calc Af Amer: 42 — ABNORMAL LOW
GFR calc Af Amer: 43 — ABNORMAL LOW
Glucose, Bld: 106 — ABNORMAL HIGH
Glucose, Bld: 108 — ABNORMAL HIGH
Glucose, Bld: 134 — ABNORMAL HIGH
Glucose, Bld: 146 — ABNORMAL HIGH
Potassium: 3.2 — ABNORMAL LOW
Potassium: 3.5
Sodium: 137
Sodium: 143

## 2011-06-14 LAB — PROTEIN ELECTROPH W RFLX QUANT IMMUNOGLOBULINS
Alpha-1-Globulin: 6.4 — ABNORMAL HIGH
Beta 2: 4.8
Gamma Globulin: 15.8
M-Spike, %: NOT DETECTED

## 2011-06-14 LAB — COMPREHENSIVE METABOLIC PANEL
ALT: 28
Albumin: 3.2 — ABNORMAL LOW
Alkaline Phosphatase: 114
BUN: 19
Chloride: 110
Glucose, Bld: 119 — ABNORMAL HIGH
Potassium: 3 — ABNORMAL LOW
Sodium: 145
Total Bilirubin: 0.9
Total Protein: 6.5

## 2011-06-14 LAB — CARDIAC PANEL(CRET KIN+CKTOT+MB+TROPI)
CK, MB: 1.7
Relative Index: INVALID
Total CK: 101
Troponin I: 0.11 — ABNORMAL HIGH

## 2011-06-14 LAB — CBC
HCT: 38.7 — ABNORMAL LOW
Hemoglobin: 11.3 — ABNORMAL LOW
MCHC: 32.8
MCHC: 33.1
MCHC: 33.7
MCV: 79.6
MCV: 79.7
Platelets: 207
Platelets: 227
RBC: 4.25
RBC: 4.72
RDW: 15.3
RDW: 16 — ABNORMAL HIGH
RDW: 16.1 — ABNORMAL HIGH
WBC: 9.5

## 2011-06-14 LAB — URINALYSIS, MICROSCOPIC ONLY
Glucose, UA: NEGATIVE
Leukocytes, UA: NEGATIVE
Nitrite: NEGATIVE
Specific Gravity, Urine: 1.01
pH: 7.5

## 2011-06-14 LAB — I-STAT 8, (EC8 V) (CONVERTED LAB)
BUN: 20
Bicarbonate: 26.7 — ABNORMAL HIGH
Glucose, Bld: 109 — ABNORMAL HIGH
HCT: 40
Hemoglobin: 13.6
Operator id: 198171
Potassium: 2.8 — ABNORMAL LOW
Sodium: 145

## 2011-06-14 LAB — LIPID PANEL
HDL: 21 — ABNORMAL LOW
LDL Cholesterol: 56
Total CHOL/HDL Ratio: 4.7
Triglycerides: 112
VLDL: 22

## 2011-06-14 LAB — DIFFERENTIAL
Basophils Absolute: 0
Basophils Relative: 1
Lymphocytes Relative: 19
Lymphs Abs: 2.1
Monocytes Absolute: 0.7
Monocytes Relative: 8
Neutro Abs: 6.3
Neutro Abs: 6.5
Neutrophils Relative %: 69
Neutrophils Relative %: 74

## 2011-06-14 LAB — RAPID URINE DRUG SCREEN, HOSP PERFORMED
Amphetamines: NOT DETECTED
Benzodiazepines: NOT DETECTED
Cocaine: NOT DETECTED

## 2011-06-14 LAB — POCT I-STAT CREATININE
Creatinine, Ser: 2 — ABNORMAL HIGH
Operator id: 198171

## 2011-06-14 LAB — URINE CULTURE: Colony Count: NO GROWTH

## 2011-06-14 LAB — CK TOTAL AND CKMB (NOT AT ARMC)
CK, MB: 2.8
Relative Index: 2.4

## 2011-06-14 LAB — TSH: TSH: 2.829

## 2011-06-14 LAB — IRON: Iron: 28 — ABNORMAL LOW

## 2011-06-14 LAB — UIFE/LIGHT CHAINS/TP QN, 24-HR UR
Albumin, U: DETECTED
Beta, Urine: DETECTED — AB
Free Kappa Lt Chains,Ur: 10.1 — ABNORMAL HIGH (ref 0.04–1.51)
Free Lambda Lt Chains,Ur: 1.2 — ABNORMAL HIGH (ref 0.08–1.01)
Gamma Globulin, Urine: DETECTED — AB

## 2011-06-14 LAB — B-NATRIURETIC PEPTIDE (CONVERTED LAB): Pro B Natriuretic peptide (BNP): 871 — ABNORMAL HIGH

## 2011-06-17 LAB — DIFFERENTIAL
Basophils Relative: 1
Basophils Relative: 0
Eosinophils Absolute: 0.1
Eosinophils Absolute: 0.1
Eosinophils Relative: 1
Lymphs Abs: 1.9
Monocytes Absolute: 1.5 — ABNORMAL HIGH
Monocytes Absolute: 1.7 — ABNORMAL HIGH
Monocytes Relative: 5
Monocytes Relative: 10
Monocytes Relative: 10
Neutro Abs: 12.4 — ABNORMAL HIGH
Neutro Abs: 12.8 — ABNORMAL HIGH
Neutrophils Relative %: 71
Neutrophils Relative %: 77

## 2011-06-17 LAB — POCT I-STAT CREATININE: Creatinine, Ser: 2.7 — ABNORMAL HIGH

## 2011-06-17 LAB — COMPREHENSIVE METABOLIC PANEL
ALT: 17
Albumin: 3 — ABNORMAL LOW
BUN: 18
Calcium: 8.6
Glucose, Bld: 98
Sodium: 136
Total Protein: 6.9

## 2011-06-17 LAB — POCT CARDIAC MARKERS
Myoglobin, poc: 140
Operator id: 257131
Troponin i, poc: <0.05

## 2011-06-17 LAB — CBC
HCT: 35 — ABNORMAL LOW
Hemoglobin: 11.3 — ABNORMAL LOW
Hemoglobin: 11.6 — ABNORMAL LOW
Hemoglobin: 12.4 — ABNORMAL LOW
MCHC: 32.5
MCHC: 32.8
MCHC: 32.8
MCHC: 33.1
MCHC: 33.4
MCV: 78.9
MCV: 78.5
MCV: 78.6
Platelets: 318
Platelets: 252
RBC: 5.27
RBC: 4.1 — ABNORMAL LOW
RBC: 4.37
RBC: 4.47
RBC: 4.5
RDW: 15.4
RDW: 14.8
RDW: 15.3
WBC: 9.7

## 2011-06-17 LAB — BASIC METABOLIC PANEL
CO2: 25
CO2: 25
CO2: 26
Calcium: 8.5
Calcium: 8.7
Chloride: 102
Chloride: 103
Creatinine, Ser: 1.92 — ABNORMAL HIGH
Creatinine, Ser: 2.12 — ABNORMAL HIGH
GFR calc Af Amer: 39 — ABNORMAL LOW
GFR calc Af Amer: 42 — ABNORMAL LOW
GFR calc Af Amer: 44 — ABNORMAL LOW
GFR calc Af Amer: 47 — ABNORMAL LOW
GFR calc non Af Amer: 35 — ABNORMAL LOW
Glucose, Bld: 98
Sodium: 138
Sodium: 138

## 2011-06-17 LAB — I-STAT 8, (EC8 V) (CONVERTED LAB)
Acid-base deficit: 5 — ABNORMAL HIGH
Chloride: 110
pCO2, Ven: 36.3 — ABNORMAL LOW
pH, Ven: 7.352 — ABNORMAL HIGH

## 2011-06-17 LAB — URINALYSIS, ROUTINE W REFLEX MICROSCOPIC
Ketones, ur: NEGATIVE
Leukocytes, UA: NEGATIVE
Nitrite: NEGATIVE
Protein, ur: 100 — AB

## 2011-07-05 LAB — BASIC METABOLIC PANEL
CO2: 28
Chloride: 104
Creatinine, Ser: 1.51 — ABNORMAL HIGH
GFR calc Af Amer: 58 — ABNORMAL LOW
Glucose, Bld: 149 — ABNORMAL HIGH
Sodium: 142

## 2011-07-05 LAB — URINE MICROSCOPIC-ADD ON

## 2011-07-05 LAB — URINALYSIS, ROUTINE W REFLEX MICROSCOPIC
Bilirubin Urine: NEGATIVE
Glucose, UA: NEGATIVE
Hgb urine dipstick: NEGATIVE
Specific Gravity, Urine: 1.019

## 2011-07-05 LAB — PROTIME-INR
INR: 1.1
Prothrombin Time: 14.1

## 2011-07-05 LAB — CBC
Hemoglobin: 12.5 — ABNORMAL LOW
MCV: 78.3
RBC: 4.71
WBC: 17.3 — ABNORMAL HIGH

## 2011-07-05 LAB — CULTURE, ROUTINE-ABSCESS

## 2011-07-05 LAB — HEPATIC FUNCTION PANEL
ALT: 26
AST: 26
Albumin: 3 — ABNORMAL LOW
Alkaline Phosphatase: 91
Bilirubin, Direct: 0.2
Indirect Bilirubin: 0.7
Total Bilirubin: 0.9
Total Protein: 6.2

## 2011-07-05 LAB — POCT I-STAT 4, (NA,K, GLUC, HGB,HCT)
Glucose, Bld: 106 — ABNORMAL HIGH
HCT: 45
Hemoglobin: 15.3
Operator id: 268271
Potassium: 3.1 — ABNORMAL LOW
Sodium: 142

## 2011-07-05 LAB — ANAEROBIC CULTURE

## 2011-07-05 LAB — APTT: aPTT: 33

## 2011-07-09 LAB — BLOOD GAS, ARTERIAL
Acid-Base Excess: 2.9 — ABNORMAL HIGH
O2 Content: 3
pCO2 arterial: 38.4
pO2, Arterial: 121 — ABNORMAL HIGH

## 2011-07-09 LAB — DIFFERENTIAL
Basophils Absolute: 0
Basophils Absolute: 0
Basophils Relative: 0
Lymphocytes Relative: 34
Monocytes Absolute: 0.6
Monocytes Absolute: 0.8 — ABNORMAL HIGH
Neutro Abs: 4
Neutro Abs: 5.2
Neutrophils Relative %: 56
Neutrophils Relative %: 58

## 2011-07-09 LAB — BASIC METABOLIC PANEL
BUN: 18
BUN: 29 — ABNORMAL HIGH
CO2: 28
Calcium: 8.6
Chloride: 102
Chloride: 104
Creatinine, Ser: 1.69 — ABNORMAL HIGH
GFR calc Af Amer: 51 — ABNORMAL LOW
GFR calc non Af Amer: 47 — ABNORMAL LOW
Glucose, Bld: 145 — ABNORMAL HIGH
Potassium: 2.9 — ABNORMAL LOW
Potassium: 3.1 — ABNORMAL LOW
Sodium: 137
Sodium: 137

## 2011-07-09 LAB — HEPATIC FUNCTION PANEL
Indirect Bilirubin: 0.5
Total Protein: 6.8

## 2011-07-09 LAB — CBC
HCT: 42.5
HCT: 42.9
HCT: 43.1
MCHC: 33.9
MCV: 79.2
MCV: 79.5
Platelets: 224
Platelets: 233
Platelets: 237
RBC: 5.35
RDW: 13.7
RDW: 14.3 — ABNORMAL HIGH
WBC: 10.5
WBC: 11.4 — ABNORMAL HIGH

## 2011-07-09 LAB — COMPREHENSIVE METABOLIC PANEL
Albumin: 3.6
BUN: 35 — ABNORMAL HIGH
Chloride: 101
Creatinine, Ser: 1.54 — ABNORMAL HIGH
Glucose, Bld: 196 — ABNORMAL HIGH
Total Bilirubin: 0.5
Total Protein: 7.1

## 2011-07-09 LAB — TSH: TSH: 2.746

## 2011-07-09 LAB — ETHANOL: Alcohol, Ethyl (B): <5

## 2011-07-09 LAB — RAPID URINE DRUG SCREEN, HOSP PERFORMED
Barbiturates: NOT DETECTED
Cocaine: NOT DETECTED
Opiates: NOT DETECTED

## 2011-07-09 LAB — MAGNESIUM: Magnesium: 2

## 2011-07-09 LAB — ACETAMINOPHEN LEVEL: Acetaminophen (Tylenol), Serum: <10 — ABNORMAL LOW

## 2012-10-18 ENCOUNTER — Encounter (HOSPITAL_COMMUNITY): Payer: Self-pay | Admitting: *Deleted

## 2012-10-18 ENCOUNTER — Emergency Department (HOSPITAL_COMMUNITY)
Admission: EM | Admit: 2012-10-18 | Discharge: 2012-10-18 | Disposition: A | Discharge status: Home / Self Care | Payer: Medicaid Other | Attending: Emergency Medicine | Admitting: Emergency Medicine

## 2012-10-18 ENCOUNTER — Emergency Department (HOSPITAL_COMMUNITY): Payer: Medicaid Other

## 2012-10-18 DIAGNOSIS — E119 Type 2 diabetes mellitus without complications: Secondary | ICD-10-CM | POA: Insufficient documentation

## 2012-10-18 DIAGNOSIS — J4489 Other specified chronic obstructive pulmonary disease: Secondary | ICD-10-CM | POA: Insufficient documentation

## 2012-10-18 DIAGNOSIS — Z8589 Personal history of malignant neoplasm of other organs and systems: Secondary | ICD-10-CM | POA: Insufficient documentation

## 2012-10-18 DIAGNOSIS — M1610 Unilateral primary osteoarthritis, unspecified hip: Secondary | ICD-10-CM | POA: Insufficient documentation

## 2012-10-18 DIAGNOSIS — I10 Essential (primary) hypertension: Secondary | ICD-10-CM | POA: Insufficient documentation

## 2012-10-18 DIAGNOSIS — I2510 Atherosclerotic heart disease of native coronary artery without angina pectoris: Secondary | ICD-10-CM | POA: Insufficient documentation

## 2012-10-18 DIAGNOSIS — M169 Osteoarthritis of hip, unspecified: Secondary | ICD-10-CM | POA: Insufficient documentation

## 2012-10-18 DIAGNOSIS — Z8719 Personal history of other diseases of the digestive system: Secondary | ICD-10-CM | POA: Insufficient documentation

## 2012-10-18 DIAGNOSIS — J449 Chronic obstructive pulmonary disease, unspecified: Secondary | ICD-10-CM | POA: Insufficient documentation

## 2012-10-18 DIAGNOSIS — M1612 Unilateral primary osteoarthritis, left hip: Secondary | ICD-10-CM

## 2012-10-18 HISTORY — DX: Atherosclerotic heart disease of native coronary artery without angina pectoris: I25.10

## 2012-10-18 HISTORY — DX: Type 2 diabetes mellitus without complications: E11.9

## 2012-10-18 HISTORY — DX: Unspecified asthma, uncomplicated: J45.909

## 2012-10-18 HISTORY — DX: Unspecified osteoarthritis, unspecified site: M19.90

## 2012-10-18 HISTORY — DX: Essential (primary) hypertension: I10

## 2012-10-18 HISTORY — DX: Chronic obstructive pulmonary disease, unspecified: J44.9

## 2012-10-18 HISTORY — DX: Malignant (primary) neoplasm, unspecified: C80.1

## 2012-10-18 HISTORY — DX: Disorder of kidney and ureter, unspecified: N28.9

## 2012-10-18 MED ORDER — TRAMADOL HCL 50 MG PO TABS
50.0000 mg | ORAL_TABLET | Freq: Once | ORAL | Status: AC
  Administered 2012-10-18: 50 mg via ORAL
  Filled 2012-10-18: qty 1

## 2012-10-18 MED ORDER — TRAMADOL HCL 50 MG PO TABS: 50.0000 mg | ORAL_TABLET | Freq: Four times a day (QID) | ORAL | Status: DC | PRN

## 2012-10-18 NOTE — ED Provider Notes (Signed)
History  This chart was scribed for non-physician practitioner working with Benny Lennert, MD by Ardeen Jourdain, ED Scribe. This patient was seen in room TR08C/TR08C and the patient's care was started at 1600.  CSN: [id number]  Arrival date & time 10/18/12  1557   First MD Initiated Contact with Patient 10/18/12 1600      Chief Complaint  Patient presents with  . Hip Pain     The history is provided by the patient. No language interpreter was used.    Noah Lewis is a 62 y.o. male brought in by ambulance, who presents to the Emergency Department complaining of left hip pain that began to acutely worsen 2 nights ago. He states the pain initially began a few months ago. He describes the pain as similar to his arthritis pain but more severe. He denies taking anything to relieve the pain. He states the pain is aggravated by walking. He states the pain is slightly relieved by resting. He denies any recent injury or trauma to the area. He denies any numbness, tingling and radiation of pain.   Past Medical History  Diagnosis Date  . Cancer   . Arthritis   . Renal disorder   . Hypertension   . Diabetes mellitus without complication   . Asthma   . COPD (chronic obstructive pulmonary disease)   . Coronary artery disease     No past surgical history on file.  No family history on file.  History  Substance Use Topics  . Smoking status: Not on file  . Smokeless tobacco: Not on file  . Alcohol Use: Not on file      Review of Systems  Musculoskeletal: Positive for arthralgias.  All other systems reviewed and are negative.    Allergies  Penicillins  Home Medications   Current Outpatient Rx  Name  Route  Sig  Dispense  Refill  . IBUPROFEN 200 MG PO TABS   Oral   Take 400 mg by mouth every 6 (six) hours as needed. For pain         . TRAMADOL HCL 50 MG PO TABS   Oral   Take 50 mg by mouth every 6 (six) hours as needed. For pain           Triage Vitals: BP  182/105  Pulse 103  Temp 97.6 F (36.4 C) (Oral)  Resp 20  SpO2 97%  Physical Exam  Nursing note and vitals reviewed. Constitutional: He is oriented to person, place, and time. He appears well-developed and well-nourished. No distress.  HENT:  Head: Normocephalic and atraumatic.  Eyes: EOM are normal. Pupils are equal, round, and reactive to light.  Neck: Normal range of motion. Neck supple. No tracheal deviation present.  Cardiovascular: Normal rate, regular rhythm, normal heart sounds and intact distal pulses.  Exam reveals no gallop and no friction rub.   No murmur heard. Pulmonary/Chest: Effort normal and breath sounds normal. No respiratory distress. He has no wheezes. He has no rales. He exhibits no tenderness.  Abdominal: Soft. Bowel sounds are normal. He exhibits no distension. There is no tenderness.  Musculoskeletal: He exhibits tenderness. He exhibits no edema.       TTP over anterior left hip, no obvious deformity. Left hip ROM limited secondary to pain   Neurological: He is alert and oriented to person, place, and time.       Strength and sensation equal throughout   Skin: Skin is warm and dry.  Psychiatric: He has  a normal mood and affect. His behavior is normal.    ED Course  Procedures (including critical care time)  DIAGNOSTIC STUDIES: Oxygen Saturation is 97% on room air, normal by my interpretation.    COORDINATION OF CARE:  5:22 PM: Discussed treatment plan which includes x-ray of the left hip and pain medication with pt at bedside and pt agreed to plan.     Labs Reviewed - No data to display Dg Hip Complete Left  10/18/2012  *RADIOLOGY REPORT*  Clinical Data: Left hip and leg pain.  LEFT HIP - COMPLETE 2+ VIEW  Comparison: 01/30/2007  Findings: Degenerative subcortical cyst formation noted in the left lateral acetabulum, mildly progressive from 2008.  There is further loss of craniocaudad articular space in the left hip.  No conventional radiographic  evidence of fracture or avascular necrosis of the hips.  Spurring of the left femoral head noted.  IMPRESSION:  1.  Osteoarthritis of the hips, left greater than right.   Original Report Authenticated By: Gaylyn Rong, M.D.      1. Osteoarthritis of left hip       MDM  Patient will have tramadol for chronic hip pain. Xray unremarkable. No injury or neurovascular compromise. No further evaluation needed at this time.       I personally performed the services described in this documentation, which was scribed in my presence. The recorded information has been reviewed and is accurate.    Emilia Beck, PA-C 10/18/12 2234

## 2012-10-18 NOTE — ED Notes (Signed)
PT has a known Hx of arthritis in Lt hip . Pt called EMS because of a increase in pain.

## 2012-10-19 NOTE — ED Provider Notes (Signed)
Medical screening examination/treatment/procedure(s) were performed by non-physician practitioner and as supervising physician I was immediately available for consultation/collaboration.   Benny Lennert, MD 10/19/12 [id number]

## 2012-10-20 ENCOUNTER — Emergency Department (HOSPITAL_COMMUNITY)
Admission: EM | Admit: 2012-10-20 | Discharge: 2012-10-20 | Disposition: A | Discharge status: Home / Self Care | Payer: Medicaid Other | Attending: Emergency Medicine | Admitting: Emergency Medicine

## 2012-10-20 ENCOUNTER — Encounter (HOSPITAL_COMMUNITY): Payer: Self-pay | Admitting: Emergency Medicine

## 2012-10-20 DIAGNOSIS — E119 Type 2 diabetes mellitus without complications: Secondary | ICD-10-CM | POA: Insufficient documentation

## 2012-10-20 DIAGNOSIS — J449 Chronic obstructive pulmonary disease, unspecified: Secondary | ICD-10-CM | POA: Insufficient documentation

## 2012-10-20 DIAGNOSIS — Z87448 Personal history of other diseases of urinary system: Secondary | ICD-10-CM | POA: Insufficient documentation

## 2012-10-20 DIAGNOSIS — J4489 Other specified chronic obstructive pulmonary disease: Secondary | ICD-10-CM | POA: Insufficient documentation

## 2012-10-20 DIAGNOSIS — J45909 Unspecified asthma, uncomplicated: Secondary | ICD-10-CM | POA: Insufficient documentation

## 2012-10-20 DIAGNOSIS — I2510 Atherosclerotic heart disease of native coronary artery without angina pectoris: Secondary | ICD-10-CM | POA: Insufficient documentation

## 2012-10-20 DIAGNOSIS — Z992 Dependence on renal dialysis: Secondary | ICD-10-CM | POA: Insufficient documentation

## 2012-10-20 DIAGNOSIS — Z8739 Personal history of other diseases of the musculoskeletal system and connective tissue: Secondary | ICD-10-CM | POA: Insufficient documentation

## 2012-10-20 DIAGNOSIS — I120 Hypertensive chronic kidney disease with stage 5 chronic kidney disease or end stage renal disease: Secondary | ICD-10-CM | POA: Insufficient documentation

## 2012-10-20 DIAGNOSIS — Z859 Personal history of malignant neoplasm, unspecified: Secondary | ICD-10-CM | POA: Insufficient documentation

## 2012-10-20 DIAGNOSIS — N186 End stage renal disease: Secondary | ICD-10-CM

## 2012-10-20 LAB — POCT I-STAT, CHEM 8
BUN: 59 mg/dL — ABNORMAL HIGH (ref 6–23)
Chloride: 105 mEq/L (ref 96–112)
Creatinine, Ser: 11 mg/dL — ABNORMAL HIGH (ref 0.50–1.35)
Potassium: 3.9 mEq/L (ref 3.5–5.1)
Sodium: 140 mEq/L (ref 135–145)

## 2012-10-20 MED ORDER — TRAMADOL HCL 50 MG PO TABS: 50.0000 mg | ORAL_TABLET | Freq: Four times a day (QID) | ORAL | Status: DC | PRN

## 2012-10-20 MED ORDER — FUROSEMIDE 40 MG PO TABS: 80.0000 mg | ORAL_TABLET | Freq: Every day | ORAL | Status: DC

## 2012-10-20 MED ORDER — LABETALOL HCL 200 MG PO TABS: 400.0000 mg | ORAL_TABLET | Freq: Three times a day (TID) | ORAL | Status: DC

## 2012-10-20 NOTE — ED Notes (Signed)
ED MD aware of elevated BP, no new orders

## 2012-10-20 NOTE — ED Notes (Signed)
Reports needs dialysis. Pt with no voiced complaints. Denies SOB. States normally gets it M-W-F & got it Tuesday & Thurs last week. States he broke up with girlfriend & moved out last week from [facility] & that's where he has all of his meds. Reports now lives here in [place] & is set to start dialysis at a kidney center here on Monday. Pt has not been able to replace any of his prescriptions.

## 2012-10-20 NOTE — ED Provider Notes (Addendum)
History     CSN: [id number]  Arrival date & time 10/20/12  1024   First MD Initiated Contact with Patient 10/20/12 1048      Chief Complaint  Patient presents with  . Needs Dialysis     (Consider location/radiation/quality/duration/timing/severity/associated sxs/prior treatment) HPI Comments: Patient states he is here for dialysis. He states he last dialyzed on Thursday and has missed dialysis since that time. He has missed dialysis because he's been staying with his family here in [place] because he recently broke up with his girlfriend in [place] and is not staying there any longer. His physician and dialysis unit or in [place]. He has called to switch but cannot get in for dialysis until Monday next week. He denies any shortness of breath, chest pain, weight gain, leg swelling, nausea or vomiting.  The history is provided by the patient.    Past Medical History  Diagnosis Date  . Cancer   . Arthritis   . Renal disorder   . Hypertension   . Diabetes mellitus without complication   . Asthma   . COPD (chronic obstructive pulmonary disease)   . Coronary artery disease     History reviewed. No pertinent past surgical history.  No family history on file.  History  Substance Use Topics  . Smoking status: Never Smoker   . Smokeless tobacco: Not on file  . Alcohol Use: No      Review of Systems  Constitutional: Negative for fever.  Respiratory: Negative for cough and shortness of breath.   Gastrointestinal: Negative for nausea, vomiting, abdominal pain and abdominal distention.  Neurological: Negative for headaches.  All other systems reviewed and are negative.    Allergies  Penicillins  Home Medications   Current Outpatient Rx  Name  Route  Sig  Dispense  Refill  . IBUPROFEN 200 MG PO TABS   Oral   Take 400 mg by mouth every 6 (six) hours as needed. For pain         . TRAMADOL HCL 50 MG PO TABS   Oral   Take 50 mg by mouth every 6 (six) hours as  needed. For pain         . TRAMADOL HCL 50 MG PO TABS   Oral   Take 1 tablet (50 mg total) by mouth every 6 (six) hours as needed for pain.   20 tablet   0     There were no vitals taken for this visit.  Physical Exam  Nursing note and vitals reviewed. Constitutional: He is oriented to person, place, and time. He appears well-developed and well-nourished. No distress.  HENT:  Head: Normocephalic and atraumatic.  Mouth/Throat: Oropharynx is clear and moist.  Eyes: Conjunctivae normal and EOM are normal. Pupils are equal, round, and reactive to light.  Neck: Normal range of motion. Neck supple.  Cardiovascular: Normal rate, regular rhythm and intact distal pulses.   No murmur heard. Pulmonary/Chest: Effort normal. No respiratory distress. He has no wheezes. He has rales in the right lower field and the left lower field.  Abdominal: Soft. He exhibits no distension. There is no tenderness. There is no rebound and no guarding.  Musculoskeletal: Normal range of motion. He exhibits edema. He exhibits no tenderness.       Left upper extremity edema with AV fistula present with palpable thrill  Neurological: He is alert and oriented to person, place, and time.  Skin: Skin is warm and dry. No rash noted. No erythema.  Psychiatric:  He has a normal mood and affect. His behavior is normal.    ED Course  Procedures (including critical care time)  Labs Reviewed  POCT I-STAT, CHEM 8 - Abnormal; Notable for the following:    BUN 59 (*)     Creatinine, Ser 11.00 (*)     Glucose, Bld 108 (*)     Calcium, Ion 1.01 (*)     Hemoglobin 12.6 (*)     HCT 37.0 (*)     All other components within normal limits   Dg Hip Complete Left  10/18/2012  *RADIOLOGY REPORT*  Clinical Data: Left hip and leg pain.  LEFT HIP - COMPLETE 2+ VIEW  Comparison: 01/30/2007  Findings: Degenerative subcortical cyst formation noted in the left lateral acetabulum, mildly progressive from 2008.  There is further loss of  craniocaudad articular space in the left hip.  No conventional radiographic evidence of fracture or avascular necrosis of the hips.  Spurring of the left femoral head noted.  IMPRESSION:  1.  Osteoarthritis of the hips, left greater than right.   Original Report Authenticated By: Gaylyn Rong, M.D.      1. Dialysis patient   2. End stage renal disease       MDM   Patient is a dialysis patient who last dialyzed on Thursday. He states that he typically lives in [place] and gets his dialysis there however he has changed his living situation and cannot be dialyzed here in total Monday. He denies any shortness of breath, chest pain or swelling. He states he just thought he needed dialysis. Also he is not taking any of his home medications because they were left at his ex-girlfriend's house.  I-STAT pending to see if patient needs emergent dialysis. We'll give him a prescription for all of his new medications and will speak with care management about possibly getting into the dialysis unit sooner than next Monday  11:45 AM Discussed with case management and they spoke with the dialysis center and stated that he will be unable to be dialyzed any sooner than May of next week. That he needs to return to [facility] to get dialysis. This was discussed with the patient and he states he will not return to [facility] and that he is fine to make it until next week. Stressed with him that he could potentially be develop hyperkalemia which could lead to death and he states he understands but he's not going back to her to get dialyzed and will return here to get his potassium checked.      Gwyneth Sprout, MD 10/20/12 1203  Gwyneth Sprout, MD 10/20/12 1215

## 2012-10-20 NOTE — ED Notes (Signed)
States has dialysis M W F.  Last dialysis was Thursday. Missed dialysis today.  States has no complaints.  Have not taken his home medications since Sunday.

## 2015-04-28 ENCOUNTER — Inpatient Hospital Stay (HOSPITAL_COMMUNITY): Payer: Medicaid Other

## 2015-04-28 ENCOUNTER — Encounter (HOSPITAL_COMMUNITY): Payer: Self-pay

## 2015-04-28 ENCOUNTER — Inpatient Hospital Stay (HOSPITAL_COMMUNITY)
Admission: EM | Admit: 2015-04-28 | Discharge: 2015-05-03 | DRG: 640 | Discharge status: Against Medical Advice | Payer: Medicaid Other | Attending: Internal Medicine | Admitting: Internal Medicine

## 2015-04-28 ENCOUNTER — Emergency Department (HOSPITAL_COMMUNITY): Payer: Medicaid Other

## 2015-04-28 DIAGNOSIS — E872 Acidosis: Secondary | ICD-10-CM | POA: Diagnosis present

## 2015-04-28 DIAGNOSIS — M7989 Other specified soft tissue disorders: Secondary | ICD-10-CM | POA: Diagnosis not present

## 2015-04-28 DIAGNOSIS — Z7982 Long term (current) use of aspirin: Secondary | ICD-10-CM | POA: Diagnosis not present

## 2015-04-28 DIAGNOSIS — N186 End stage renal disease: Secondary | ICD-10-CM | POA: Diagnosis present

## 2015-04-28 DIAGNOSIS — F3130 Bipolar disorder, current episode depressed, mild or moderate severity, unspecified: Secondary | ICD-10-CM | POA: Diagnosis not present

## 2015-04-28 DIAGNOSIS — Z79899 Other long term (current) drug therapy: Secondary | ICD-10-CM

## 2015-04-28 DIAGNOSIS — J449 Chronic obstructive pulmonary disease, unspecified: Secondary | ICD-10-CM | POA: Diagnosis not present

## 2015-04-28 DIAGNOSIS — F319 Bipolar disorder, unspecified: Secondary | ICD-10-CM | POA: Diagnosis present

## 2015-04-28 DIAGNOSIS — E1122 Type 2 diabetes mellitus with diabetic chronic kidney disease: Secondary | ICD-10-CM | POA: Diagnosis present

## 2015-04-28 DIAGNOSIS — E8770 Fluid overload, unspecified: Secondary | ICD-10-CM | POA: Diagnosis present

## 2015-04-28 DIAGNOSIS — J9620 Acute and chronic respiratory failure, unspecified whether with hypoxia or hypercapnia: Secondary | ICD-10-CM | POA: Diagnosis not present

## 2015-04-28 DIAGNOSIS — R45851 Suicidal ideations: Secondary | ICD-10-CM | POA: Diagnosis present

## 2015-04-28 DIAGNOSIS — Z9119 Patient's noncompliance with other medical treatment and regimen: Secondary | ICD-10-CM | POA: Diagnosis present

## 2015-04-28 DIAGNOSIS — I82722 Chronic embolism and thrombosis of deep veins of left upper extremity: Secondary | ICD-10-CM | POA: Diagnosis not present

## 2015-04-28 DIAGNOSIS — R220 Localized swelling, mass and lump, head: Secondary | ICD-10-CM | POA: Diagnosis present

## 2015-04-28 DIAGNOSIS — Z791 Long term (current) use of non-steroidal anti-inflammatories (NSAID): Secondary | ICD-10-CM | POA: Diagnosis not present

## 2015-04-28 DIAGNOSIS — D631 Anemia in chronic kidney disease: Secondary | ICD-10-CM | POA: Diagnosis present

## 2015-04-28 DIAGNOSIS — G8929 Other chronic pain: Secondary | ICD-10-CM | POA: Diagnosis present

## 2015-04-28 DIAGNOSIS — Z880 Allergy status to penicillin: Secondary | ICD-10-CM

## 2015-04-28 DIAGNOSIS — I120 Hypertensive chronic kidney disease with stage 5 chronic kidney disease or end stage renal disease: Secondary | ICD-10-CM | POA: Diagnosis not present

## 2015-04-28 DIAGNOSIS — R0602 Shortness of breath: Secondary | ICD-10-CM | POA: Diagnosis present

## 2015-04-28 DIAGNOSIS — I2510 Atherosclerotic heart disease of native coronary artery without angina pectoris: Secondary | ICD-10-CM | POA: Diagnosis present

## 2015-04-28 DIAGNOSIS — Z992 Dependence on renal dialysis: Secondary | ICD-10-CM | POA: Diagnosis not present

## 2015-04-28 DIAGNOSIS — Z590 Homelessness: Secondary | ICD-10-CM

## 2015-04-28 DIAGNOSIS — I871 Compression of vein: Secondary | ICD-10-CM | POA: Diagnosis present

## 2015-04-28 DIAGNOSIS — Z86718 Personal history of other venous thrombosis and embolism: Secondary | ICD-10-CM | POA: Diagnosis not present

## 2015-04-28 DIAGNOSIS — M545 Low back pain: Secondary | ICD-10-CM | POA: Diagnosis present

## 2015-04-28 DIAGNOSIS — Z9981 Dependence on supplemental oxygen: Secondary | ICD-10-CM | POA: Diagnosis not present

## 2015-04-28 DIAGNOSIS — F29 Unspecified psychosis not due to a substance or known physiological condition: Secondary | ICD-10-CM | POA: Diagnosis not present

## 2015-04-28 DIAGNOSIS — G4733 Obstructive sleep apnea (adult) (pediatric): Secondary | ICD-10-CM | POA: Diagnosis present

## 2015-04-28 DIAGNOSIS — R0600 Dyspnea, unspecified: Secondary | ICD-10-CM

## 2015-04-28 DIAGNOSIS — J9621 Acute and chronic respiratory failure with hypoxia: Secondary | ICD-10-CM | POA: Diagnosis present

## 2015-04-28 DIAGNOSIS — R9431 Abnormal electrocardiogram [ECG] [EKG]: Secondary | ICD-10-CM

## 2015-04-28 DIAGNOSIS — Z9115 Patient's noncompliance with renal dialysis: Secondary | ICD-10-CM

## 2015-04-28 DIAGNOSIS — R600 Localized edema: Secondary | ICD-10-CM

## 2015-04-28 DIAGNOSIS — M549 Dorsalgia, unspecified: Secondary | ICD-10-CM

## 2015-04-28 DIAGNOSIS — E8779 Other fluid overload: Secondary | ICD-10-CM | POA: Diagnosis not present

## 2015-04-28 DIAGNOSIS — F419 Anxiety disorder, unspecified: Secondary | ICD-10-CM | POA: Diagnosis not present

## 2015-04-28 LAB — I-STAT CHEM 8, ED
BUN: 76 mg/dL — ABNORMAL HIGH (ref 6–20)
CHLORIDE: 106 mmol/L (ref 101–111)
CREATININE: 14.2 mg/dL — AB (ref 0.61–1.24)
Calcium, Ion: 0.98 mmol/L — ABNORMAL LOW (ref 1.13–1.30)
Glucose, Bld: 98 mg/dL (ref 65–99)
HCT: 42 % (ref 39.0–52.0)
Hemoglobin: 14.3 g/dL (ref 13.0–17.0)
Potassium: 5.1 mmol/L (ref 3.5–5.1)
SODIUM: 139 mmol/L (ref 135–145)
TCO2: 17 mmol/L (ref 0–100)

## 2015-04-28 LAB — COMPREHENSIVE METABOLIC PANEL
ALT: 61 U/L (ref 17–63)
AST: 64 U/L — ABNORMAL HIGH (ref 15–41)
Albumin: 3.7 g/dL (ref 3.5–5.0)
Alkaline Phosphatase: 89 U/L (ref 38–126)
Anion gap: 19 — ABNORMAL HIGH (ref 5–15)
BUN: 77 mg/dL — AB (ref 6–20)
CALCIUM: 8.2 mg/dL — AB (ref 8.9–10.3)
CHLORIDE: 104 mmol/L (ref 101–111)
CO2: 17 mmol/L — ABNORMAL LOW (ref 22–32)
Creatinine, Ser: 14.59 mg/dL — ABNORMAL HIGH (ref 0.61–1.24)
GFR calc Af Amer: 4 mL/min — ABNORMAL LOW (ref >60)
GFR calc non Af Amer: 3 mL/min — ABNORMAL LOW (ref >60)
Glucose, Bld: 102 mg/dL — ABNORMAL HIGH (ref 65–99)
Potassium: 4.9 mmol/L (ref 3.5–5.1)
SODIUM: 140 mmol/L (ref 135–145)
TOTAL PROTEIN: 7.5 g/dL (ref 6.5–8.1)
Total Bilirubin: 1.1 mg/dL (ref 0.3–1.2)

## 2015-04-28 LAB — TROPONIN I
TROPONIN I: 0.03 ng/mL (ref <0.031)
Troponin I: 0.04 ng/mL — ABNORMAL HIGH (ref <0.031)

## 2015-04-28 LAB — CBC WITH DIFFERENTIAL/PLATELET
BASOS ABS: 0 10*3/uL (ref 0.0–0.1)
Basophils Relative: 0 % (ref 0–1)
Eosinophils Absolute: 0.1 10*3/uL (ref 0.0–0.7)
Eosinophils Relative: 2 % (ref 0–5)
HCT: 35.4 % — ABNORMAL LOW (ref 39.0–52.0)
HEMOGLOBIN: 11.3 g/dL — AB (ref 13.0–17.0)
LYMPHS PCT: 8 % — AB (ref 12–46)
Lymphs Abs: 0.7 10*3/uL (ref 0.7–4.0)
MCH: 26.4 pg (ref 26.0–34.0)
MCHC: 31.9 g/dL (ref 30.0–36.0)
MCV: 82.7 fL (ref 78.0–100.0)
MONO ABS: 0.7 10*3/uL (ref 0.1–1.0)
Monocytes Relative: 8 % (ref 3–12)
NEUTROS PCT: 83 % — AB (ref 43–77)
Neutro Abs: 7.1 10*3/uL (ref 1.7–7.7)
Platelets: 236 10*3/uL (ref 150–400)
RBC: 4.28 MIL/uL (ref 4.22–5.81)
RDW: 18.6 % — ABNORMAL HIGH (ref 11.5–15.5)
WBC: 8.6 10*3/uL (ref 4.0–10.5)

## 2015-04-28 MED ORDER — IPRATROPIUM-ALBUTEROL 0.5-2.5 (3) MG/3ML IN SOLN: 3.0000 mL | Freq: Four times a day (QID) | RESPIRATORY_TRACT | Status: DC | PRN

## 2015-04-28 MED ORDER — DOXERCALCIFEROL 4 MCG/2ML IV SOLN
2.0000 ug | INTRAVENOUS | Status: DC
  Administered 2015-04-29: 2 ug via INTRAVENOUS
  Filled 2015-04-28 (×2): qty 2

## 2015-04-28 MED ORDER — HEPARIN SODIUM (PORCINE) 5000 UNIT/ML IJ SOLN
5000.0000 [IU] | Freq: Three times a day (TID) | INTRAMUSCULAR | Status: DC
  Filled 2015-04-28 (×2): qty 1

## 2015-04-28 MED ORDER — ALBUTEROL SULFATE (2.5 MG/3ML) 0.083% IN NEBU
2.5000 mg | INHALATION_SOLUTION | Freq: Once | RESPIRATORY_TRACT | Status: AC
  Administered 2015-04-28: 2.5 mg via RESPIRATORY_TRACT
  Filled 2015-04-28: qty 3

## 2015-04-28 MED ORDER — HEPARIN BOLUS VIA INFUSION
4000.0000 [IU] | Freq: Once | INTRAVENOUS | Status: AC
  Administered 2015-04-28: 4000 [IU] via INTRAVENOUS
  Filled 2015-04-28: qty 4000

## 2015-04-28 MED ORDER — HEPARIN (PORCINE) IN NACL 100-0.45 UNIT/ML-% IJ SOLN
1800.0000 [IU]/h | INTRAMUSCULAR | Status: DC
  Administered 2015-04-28: 1100 [IU]/h via INTRAVENOUS
  Administered 2015-04-29: 1400 [IU]/h via INTRAVENOUS
  Administered 2015-04-30: 1800 [IU]/h via INTRAVENOUS
  Filled 2015-04-28 (×3): qty 250

## 2015-04-28 MED ORDER — NITROGLYCERIN 0.4 MG SL SUBL
0.4000 mg | SUBLINGUAL_TABLET | SUBLINGUAL | Status: DC | PRN
  Filled 2015-04-28: qty 1

## 2015-04-28 NOTE — ED Provider Notes (Signed)
CSN: [id number]     Arrival date & time 04/28/15  0856 History   First MD Initiated Contact with Patient 04/28/15 0901     Chief Complaint  Patient presents with  . Shortness of Breath     (Consider location/radiation/quality/duration/timing/severity/associated sxs/prior Treatment) Patient is a 64 y.o. male presenting with shortness of breath. The history is provided by the patient.  Shortness of Breath Severity:  Mild Onset quality:  Gradual Duration:  2 months Timing:  Constant Progression:  Unchanged Chronicity:  New Relieved by:  Nothing Worsened by:  Nothing tried Ineffective treatments:  None tried Associated symptoms: cough   Associated symptoms: no abdominal pain, no chest pain, no fever, no headaches, no rash and no vomiting    64 yo M with a chief complaint of shortness of breath. Patient states is been going on for the past couple months. Patient is a dialysis patient has not been the dialysis since then. Patient denies fevers chills has some mild fatigue. Was eating breakfast this morning when his social worker saw him and decided he looked worse than his baseline and called EMS. EMS EKG without widened QRS or peak T waves.   Past Medical History  Diagnosis Date  . Cancer   . Arthritis   . Renal disorder   . Hypertension   . Diabetes mellitus without complication   . Asthma   . COPD (chronic obstructive pulmonary disease)   . Coronary artery disease    Past Surgical History  Procedure Laterality Date  . No past surgeries    . Insertion of dialysis catheter     History reviewed. No pertinent family history. History  Substance Use Topics  . Smoking status: Never Smoker   . Smokeless tobacco: Never Used  . Alcohol Use: No    Review of Systems  Constitutional: Positive for fatigue. Negative for fever and chills.  HENT: Negative for congestion and facial swelling.   Eyes: Negative for discharge and visual disturbance.  Respiratory: Positive for cough and  shortness of breath. Negative for choking and chest tightness.   Cardiovascular: Negative for chest pain and palpitations.  Gastrointestinal: Negative for vomiting, abdominal pain and diarrhea.  Genitourinary: Negative for frequency, flank pain and testicular pain.  Musculoskeletal: Negative for myalgias and arthralgias.  Skin: Negative for color change and rash.  Neurological: Negative for tremors, syncope and headaches.  Psychiatric/Behavioral: Negative for confusion and dysphoric mood.      Allergies  Penicillins  Home Medications   Prior to Admission medications   Medication Sig Start Date End Date Taking? Authorizing Provider  aspirin 81 MG chewable tablet Chew 81 mg by mouth daily.   Yes Historical Provider, MD  furosemide (LASIX) 40 MG tablet Take 2 tablets (80 mg total) by mouth daily. 10/20/12  Yes Blanchie Dessert, MD  ibuprofen (ADVIL,MOTRIN) 200 MG tablet Take 400 mg by mouth every 6 (six) hours as needed. For pain   Yes Historical Provider, MD  labetalol (NORMODYNE) 200 MG tablet Take 2 tablets (400 mg total) by mouth 3 (three) times daily. 10/20/12  Yes Blanchie Dessert, MD  traMADol (ULTRAM) 50 MG tablet Take 1 tablet (50 mg total) by mouth every 6 (six) hours as needed for pain. Patient not taking: Reported on 04/28/2015 10/20/12   Blanchie Dessert, MD   BP 160/121 mmHg  Pulse 98  Temp(Src) 97.5 F (36.4 C) (Axillary)  Resp 17  SpO2 100% Physical Exam  Constitutional: He is oriented to person, place, and time. He appears well-developed  and well-nourished.  HENT:  Head: Normocephalic and atraumatic.  Eyes: EOM are normal. Pupils are equal, round, and reactive to light.  Neck: Normal range of motion. Neck supple. No JVD present.  Cardiovascular: Normal rate and regular rhythm.  Exam reveals no gallop and no friction rub.   No murmur heard. Pulmonary/Chest: No respiratory distress. He has no wheezes.  Vas-Cath in place some purulent appearing discharge at the  insertion site and no tenderness to palpation no erythema.  Abdominal: He exhibits no distension. There is no rebound and no guarding.  Musculoskeletal: Normal range of motion. He exhibits edema (marked edema throughout the abdomen chest bilateral upper extremities and the face. Patient with trace edema to bilateral lower extremities.).  Neurological: He is alert and oriented to person, place, and time.  Skin: No rash noted. No pallor.  Psychiatric: He has a normal mood and affect. His behavior is normal.    ED Course  Procedures (including critical care time) Labs Review Labs Reviewed  COMPREHENSIVE METABOLIC PANEL - Abnormal; Notable for the following:    CO2 17 (*)    Glucose, Bld 102 (*)    BUN 77 (*)    Creatinine, Ser 14.59 (*)    Calcium 8.2 (*)    AST 64 (*)    GFR calc non Af Amer 3 (*)    GFR calc Af Amer 4 (*)    Anion gap 19 (*)    All other components within normal limits  CBC WITH DIFFERENTIAL/PLATELET - Abnormal; Notable for the following:    Hemoglobin 11.3 (*)    HCT 35.4 (*)    RDW 18.6 (*)    Neutrophils Relative % 83 (*)    Lymphocytes Relative 8 (*)    All other components within normal limits  I-STAT CHEM 8, ED - Abnormal; Notable for the following:    BUN 76 (*)    Creatinine, Ser 14.20 (*)    Calcium, Ion 0.98 (*)    All other components within normal limits  MRSA PCR SCREENING  TROPONIN I  HEMOGLOBIN A1C  TROPONIN I  TROPONIN I    Imaging Review Dg Chest Port 1 View  04/28/2015   CLINICAL DATA:  Shortness of breath, congestion, end-stage renal disease on dialysis, diabetes mellitus, hypertension, COPD, coronary artery disease  EXAM: PORTABLE CHEST - 1 VIEW  COMPARISON:  Portable exam 1003 hours compared to 02/01/2015  FINDINGS: RIGHT jugular central venous catheter with tip projecting over RIGHT atrium.  Enlargement of cardiac silhouette with pulmonary vascular congestion.  Tortuous aorta.  Chronic accentuation of interstitial markings in the mid to  lower lungs question minimal chronic pulmonary edema or fluid overload.  No segmental consolidation, pleural effusion or pneumothorax.  IMPRESSION: Enlargement of cardiac silhouette with pulmonary vascular congestion and question minimal chronic pulmonary edema/fluid overload.   Electronically Signed   By: Lavonia Dana M.D.   On: 04/28/2015 09:20     EKG Interpretation   Date/Time:  Friday April 28 2015 09:05:27 EDT Ventricular Rate:  94 PR Interval:    QRS Duration: 73 QT Interval:  363 QTC Calculation: 235 R Axis:   44 Text Interpretation:  Normal sinus rhythm Low voltage, precordial leads  Nonspecific T abnormalities, lateral leads Otherwise no significant change  Confirmed by Saman Umstead MD, DANIEL ([id number]) on 04/28/2015 9:08:09 AM      MDM   Final diagnoses:  Left upper extremity swelling  Fluid overload  64 yo M with a chief complaint of shortness breath. Patient  with significant edema. EKG without acute signs of hyperkalemia. Patient likely needs dialysis will get an i-STAT chem 8. CBC CMP chest x-ray  Case discussed with nephrology on call. Recommend hospital admission.  The patients results and plan were reviewed and discussed.   Any x-rays performed were independently reviewed by myself.   Differential diagnosis were considered with the presenting HPI.  Medications  heparin injection 5,000 Units (5,000 Units Subcutaneous Not Given 04/28/15 1552)  ipratropium-albuterol (DUONEB) 0.5-2.5 (3) MG/3ML nebulizer solution 3 mL (not administered)  doxercalciferol (HECTOROL) injection 2 mcg (not administered)  albuterol (PROVENTIL) (2.5 MG/3ML) 0.083% nebulizer solution 2.5 mg (2.5 mg Nebulization Given 04/28/15 0921)    Filed Vitals:   04/28/15 0953 04/28/15 1030 04/28/15 1045 04/28/15 1154  BP: 159/107 163/97 158/102 160/121  Pulse: 108  105 98  Temp:    97.5 F (36.4 C)  TempSrc:    Axillary  Resp: 22 17 17    SpO2: 100%  100% 100%    Final diagnoses:  Left upper extremity  swelling    Admission/ observation were discussed with the admitting physician, patient and/or family and they are comfortable with the plan.    Deno Etienne, DO 04/28/15 1626

## 2015-04-28 NOTE — H&P (Signed)
Date: 04/28/2015               Patient Name:  Noah Lewis MRN: [id number]  DOB: [birth year] Age / Sex: 64 y.o., male   PCP: Provider Not In System         Medical Service: Internal Medicine Teaching Service         Attending Physician: Dr. Bartholomew Crews, MD    First Contact: Dr. Marijean Bravo Pager: [phone]  Second Contact: Dr. Redmond Pulling Pager: [phone]       After Hours (After 5p/  First Contact Pager: [phone]  weekends / holidays): Second Contact Pager: [phone]   Chief Complaint: Shortness of breath  History of Present Illness: 29 Y O M with PMH of ESRD, HTN, Bipolar disorder, cardiac disease- Per patient, no records on file. Pt presented with SOB of 1 week duration. Gradual onset, he says he has been short of breath for a while- he cant tell exact duration, but his doctor placed him on 3L of O2 at home. He has orthopnea at baseline, used a hospital bed, but can not tell if his orthopnea is worse, he has dyspnea with exertion. He also endorses cough- productive of whitish phlegm. He denies fever, chills.  Patient is supposed to be on HD but has not been dialysed per his report in 2 weeks. [place] was previously being dialysed at [place]. He used to stay in [place] but had a disagreement with his roommate and moved to [place]. He tried to get into a shelter but couldn't, his mother who is present says he did not have an ID, so he has been living on the streets and goes to the shelter to eat and have his bath. He says he has not been going for dialysis because he is trying to kill himself, he is tired of dialysis and everything.  Of note today patient appears to be edematous, mostly on his face and left upper chest, when questioned about this he says this has been present for ~32months. He says his Dr told him he has fluid build up. He says the swelling gets particularly worse when he drinks a lot of soda. He also endorses having blood clots in his upper extremities in the past, he was not  sure what he is taking, he denies requiring frequent blood test for this, but says he was on a blood thinner, cannot tell for how long he was to be taking the medication. Mother says he required injections, but patient denies this .Swelling does not get better after dialysis. He denies signifcant swelling of his lower extremities. Patient makes some urine about 2ce a day, says the amount of urine he has been making has increased in quantity recently.  Pt denies alcohol, or illicit drug use, also never smoked cigarettes  Meds: Current Facility-Administered Medications  Medication Dose Route Frequency Provider Last Rate Last Dose  . nitroGLYCERIN (NITROSTAT) SL tablet 0.4 mg  0.4 mg Sublingual Q5 min PRN Deno Etienne, DO        Allergies: Allergies as of 04/28/2015 - Review Complete 04/28/2015  Allergen Reaction Noted  . Penicillins Other (See Comments) 10/18/2012   Past Medical History  Diagnosis Date  . Cancer   . Arthritis   . Renal disorder   . Hypertension   . Diabetes mellitus without complication   . Asthma   . COPD (chronic obstructive pulmonary disease)   . Coronary artery disease    History reviewed. No pertinent past surgical history.  No family history on file. History   Social History  . Marital Status: Married    Spouse Name: N/A  . Number of Children: N/A  . Years of Education: N/A   Occupational History  . Not on file.   Social History Main Topics  . Smoking status: Never Smoker   . Smokeless tobacco: Not on file  . Alcohol Use: No  . Drug Use: No  . Sexual Activity: Not on file   Other Topics Concern  . Not on file   Social History Narrative   Review of Systems: CONSTITUTIONAL- No Fever, or change in appetite. SKIN- No Rash, colour changes or itching. HEAD- No Headache or dizziness. CARDIAC- No chest pain. GI- No nausea, vomiting, diarrhoea, constipation, abd pain. URINARY- No Frequency, urgency, straining or dysuria. NEUROLOGIC- No Numbness,or  seizures . Psych- endorses suicidal ideation  Physical Exam: Blood pressure 158/102, pulse 105, temperature 97.5 F (36.4 C), temperature source Oral, resp. rate 17, SpO2 100 %. GENERAL- alert, co-operative, appears as stated age, in mild respiratory distress HEENT- Atraumatic, normocephalic, PERRL, EOMI, oral mucosa appears moist, significant swelling of face with peri-oribtal edema, eyes appear blood shot bilat, right eye has a fleshy growth on lateral conjuctiva, not encrouching on cornea. CARDIAC- RRR, no murmurs, rubs or gallops, slight pitting on anterior chest, worse on the left from pressure. RESP- Moving equal volumes of air, and coarse expiratory rhonchi bilaterally with upper airway transmitted sounds. ABDOMEN- Soft, nontender, no guarding or rebound, no palpable masses or organomegaly, bowel sounds present. NEURO- No obvious Cr N abnormality EXTREMITIES- Dry thickened skin bilat lower extremities, upper extremities- tense +1 pitting edema- left upper extremity, tender with increased pigmentation, pulse 2+, symmetric, no pedal edema. SKIN- Warm, dry, No rash or lesion. PSYCH- Normal mood and affect, appropriate thought content and speech.  Lab results: Basic Metabolic Panel:  Recent Labs  04/28/15 0922 04/28/15 0924  NA 139 140  K 5.1 4.9  CL 106 104  CO2  --  17*  GLUCOSE 98 102*  BUN 76* 77*  CREATININE 14.20* 14.59*  CALCIUM  --  8.2*   Liver Function Tests:  Recent Labs  04/28/15 0924  AST 64*  ALT 61  ALKPHOS 89  BILITOT 1.1  PROT 7.5  ALBUMIN 3.7   CBC:  Recent Labs  04/28/15 0922 04/28/15 0924  WBC  --  8.6  NEUTROABS  --  7.1  HGB 14.3 11.3*  HCT 42.0 35.4*  MCV  --  82.7  PLT  --  236   Urine Drug Screen: Drugs of Abuse     Component Value Date/Time   LABOPIA NONE DETECTED 10/26/2007 1400   COCAINSCRNUR NONE DETECTED 10/26/2007 1400   LABBENZ NONE DETECTED 10/26/2007 1400   AMPHETMU NONE DETECTED 10/26/2007 1400   THCU NONE DETECTED  10/26/2007 1400   LABBARB  10/26/2007 1400    NONE DETECTED        DRUG SCREEN FOR MEDICAL PURPOSES ONLY.  IF CONFIRMATION IS NEEDED FOR ANY PURPOSE, NOTIFY LAB WITHIN 5 DAYS.    Imaging results:  Dg Chest Port 1 View  04/28/2015   CLINICAL DATA:  Shortness of breath, congestion, end-stage renal disease on dialysis, diabetes mellitus, hypertension, COPD, coronary artery disease  EXAM: PORTABLE CHEST - 1 VIEW  COMPARISON:  Portable exam 1003 hours compared to 02/01/2015  FINDINGS: RIGHT jugular central venous catheter with tip projecting over RIGHT atrium.  Enlargement of cardiac silhouette with pulmonary vascular congestion.  Tortuous aorta.  Chronic  accentuation of interstitial markings in the mid to lower lungs question minimal chronic pulmonary edema or fluid overload.  No segmental consolidation, pleural effusion or pneumothorax.  IMPRESSION: Enlargement of cardiac silhouette with pulmonary vascular congestion and question minimal chronic pulmonary edema/fluid overload.   Electronically Signed   By: Lavonia Dana M.D.   On: 04/28/2015 09:20    Other results: EKG: Rate- 94bpm, P waves hard to distinguish but regular rhythm, narrow QRs- duration of 73. T wave inversion in lateral leads, no old EKGs to compare.   Assessment & Plan by Problem: Principal Problem:   Fluid overload Active Problems:   Volume overload   Bipolar depression   HTN (hypertension)   ESRD on dialysis  Fluid overload 2/2 Dialysis non complaince- Likely due to missed dialysis for 2 weeks, though he reports 4 months to the Ed provider. Bmet- with K- 4.9, bicarb of 17, with anion gap- 19. Chest xray suggestive of fluid overload.  - Admit to tele - Nephrology consulted in the Ed - Echo as chest xray suggests cardiac enlargement  ESRD on Dialysis- Tue thur sat. Pts nephrologist is Dr Neta Ehlers at [place]. Called patients dialysis center in [place], apparently patients last dialysis session was July 30th- this would  explain his blood work not been significantly deranged . They also noted he very non-complaint. - Request records, last office visit they have is from 2013, records will be faxed over - Contact pts dialysis center for records- [place] [facility]- [phone].  Face and left upper extremity swelling- Swelling concerning for DVT with pain, hyperpigmentation and ?prior hx but this would not explain facial swelling. Considering face and upper extremity swelling over the past 6 months concern for superior vena cava syndrome. Considering malignancy he denies ever smoking cigarretes.  - Left upper extremity ultrasound - If Upper extremity US is negative and not suggestive of Superior vena cava syndrome, then will need CT Chest with contrast to evaluate anatomy and collaterals if present. - Raise head of bed  Abnormal EKG- t wave inversion in lateral leads. No old EKG to compare. Without chest pain.  - repeat EKG am - Trend trops.  HTN- Bp elevated, likely also due to fluid overload. Home meds- Labetalol 400mg  TID. - Restart Bp meds after first session of dialysis  Suicidal ideation and BPD- Patient does not know the name of the medication he takes. He expressed his wish to kill himself - Psych consult. - he is also med for BPD, ?names.  Dispo: Disposition is deferred at this time, awaiting improvement of current medical problems.   The patient does not know have a current PCP (Provider Not In System) and does not know need an [facility] hospital follow-up appointment after discharge.  The patient does not know have transportation limitations that hinder transportation to clinic appointments.  Signed: [name], MD 04/28/2015, 11:47 AM

## 2015-04-28 NOTE — ED Notes (Signed)
Pt. Presents with difficulty breathing, facial swelling. Questionable when last dialysis txt was. Pt AxO x4.

## 2015-04-28 NOTE — Consult Note (Addendum)
Noah Lewis   Reason for Lewis:  Bipolar depression with suicide ideation Referring Physician:  Dr. Lynnae January Patient Identification: Noah Lewis MRN:  [id number] Principal Diagnosis: Bipolar depression Diagnosis:   Patient Active Problem List   Diagnosis Date Noted  . Volume overload [E87.70] 04/28/2015  . Fluid overload [E87.70] 04/28/2015  . Bipolar depression [F31.30] 04/28/2015    Total Time spent with patient: 45 minutes  Subjective:   Noah Lewis is a 64 y.o. male patient admitted with depression, suicide thoughts and non compliance with dialysis.  HPI:  Noah Lewis is a 64 years old male seen face-to-face for this psychiatric consultation and evaluation and reviewed available medical records and case discussed with the staff RN, psychiatrically social service and nephrologist. Patient staff nurse reported that he has a hard hearing, and hospital nephrologist reported patient was last completed hemodialysis about a week ago. Patient has been suffering with end-stage renal disease and noncompliant with his dialysis for the last 1 week. Patient was relocated from [place] to the [place] and has been living as a homeless. Patient has a mother in [place] who is supportive to him if needed. Patient has been admitted to [facility] when he was found with shortness of breath and wheezing. He was eating in a shelter. Patient stated he has been tired of getting hemodialysis. Patient endorses symptoms of depression, lack of interest, isolation, withdrawn and generalized weakness. Patient also endorses suicidal ideation without any intention or plans. Patient has no previous history of acute psychiatric hospitalization. Patient does not remember his psychiatric medication or psychiatric care in [facility]. Will ask psychiatric social service to contact his provider is no [place] regarding appropriate psychiatric medication management for  bipolar depression.   HPI Elements:   Location:  Bipolar depression. Quality:  Fair to poor. Severity:  Loss of interest in hemodialysis. Timing:  Reportedly being tired of the treatment. Duration:  One week. Context:  Psychosocial stresses and chronic medical condition.  Past Medical History:  Past Medical History  Diagnosis Date  . Cancer   . Arthritis   . Renal disorder   . Hypertension   . Diabetes mellitus without complication   . Asthma   . COPD (chronic obstructive pulmonary disease)   . Coronary artery disease    History reviewed. No pertinent past surgical history. Family History: No family history on file. Social History:  History  Alcohol Use No     History  Drug Use No    History   Social History  . Marital Status: Married    Spouse Name: N/A  . Number of Children: N/A  . Years of Education: N/A   Social History Main Topics  . Smoking status: Never Smoker   . Smokeless tobacco: Not on file  . Alcohol Use: No  . Drug Use: No  . Sexual Activity: Not on file   Other Topics Concern  . None   Social History Narrative   Additional Social History:                          Allergies:   Allergies  Allergen Reactions  . Penicillins Other (See Comments)    unsure    Labs:  Results for orders placed or performed during the hospital encounter of 04/28/15 (from the past 48 hour(s))  I-stat chem 8, ED  (not at [facility], [facility])     Status: Abnormal   Collection Time: 04/28/15  9:22 AM  Result Value Ref Range   Sodium 139 135 - 145 mmol/L   Potassium 5.1 3.5 - 5.1 mmol/L   Chloride 106 101 - 111 mmol/L   BUN 76 (H) 6 - 20 mg/dL   Creatinine, Ser 14.20 (H) 0.61 - 1.24 mg/dL   Glucose, Bld 98 65 - 99 mg/dL   Calcium, Ion 0.98 (L) 1.13 - 1.30 mmol/L   TCO2 17 0 - 100 mmol/L   Hemoglobin 14.3 13.0 - 17.0 g/dL   HCT 42.0 39.0 - 52.0 %  Comprehensive metabolic panel     Status: Abnormal   Collection Time: 04/28/15  9:24 AM  Result Value Ref Range    Sodium 140 135 - 145 mmol/L   Potassium 4.9 3.5 - 5.1 mmol/L   Chloride 104 101 - 111 mmol/L   CO2 17 (L) 22 - 32 mmol/L   Glucose, Bld 102 (H) 65 - 99 mg/dL   BUN 77 (H) 6 - 20 mg/dL   Creatinine, Ser 14.59 (H) 0.61 - 1.24 mg/dL   Calcium 8.2 (L) 8.9 - 10.3 mg/dL   Total Protein 7.5 6.5 - 8.1 g/dL   Albumin 3.7 3.5 - 5.0 g/dL   AST 64 (H) 15 - 41 U/L   ALT 61 17 - 63 U/L   Alkaline Phosphatase 89 38 - 126 U/L   Total Bilirubin 1.1 0.3 - 1.2 mg/dL   GFR calc non Af Amer 3 (L) >60 mL/min   GFR calc Af Amer 4 (L) >60 mL/min    Comment: (NOTE) The eGFR has been calculated using the CKD EPI equation. This calculation has not been validated in all clinical situations. eGFR's persistently <60 mL/min signify possible Chronic Kidney Disease.    Anion gap 19 (H) 5 - 15  CBC WITH DIFFERENTIAL     Status: Abnormal   Collection Time: 04/28/15  9:24 AM  Result Value Ref Range   WBC 8.6 4.0 - 10.5 K/uL   RBC 4.28 4.22 - 5.81 MIL/uL   Hemoglobin 11.3 (L) 13.0 - 17.0 g/dL    Comment: SPECIMEN CHECKED FOR CLOTS REPEATED TO VERIFY    HCT 35.4 (L) 39.0 - 52.0 %   MCV 82.7 78.0 - 100.0 fL   MCH 26.4 26.0 - 34.0 pg   MCHC 31.9 30.0 - 36.0 g/dL   RDW 18.6 (H) 11.5 - 15.5 %   Platelets 236 150 - 400 K/uL   Neutrophils Relative % 83 (H) 43 - 77 %   Neutro Abs 7.1 1.7 - 7.7 K/uL   Lymphocytes Relative 8 (L) 12 - 46 %   Lymphs Abs 0.7 0.7 - 4.0 K/uL   Monocytes Relative 8 3 - 12 %   Monocytes Absolute 0.7 0.1 - 1.0 K/uL   Eosinophils Relative 2 0 - 5 %   Eosinophils Absolute 0.1 0.0 - 0.7 K/uL   Basophils Relative 0 0 - 1 %   Basophils Absolute 0.0 0.0 - 0.1 K/uL    Vitals: Blood pressure 160/121, pulse 98, temperature 97.5 F (36.4 C), temperature source Axillary, resp. rate 17, SpO2 100 %.  Risk to Self: Is patient at risk for suicide?: No Risk to Others:   Prior Inpatient Therapy:   Prior Outpatient Therapy:    Current Facility-Administered Medications  Medication Dose Route  Frequency Provider Last Rate Last Dose  . heparin injection 5,000 Units  5,000 Units Subcutaneous 3 times per day [name], MD        Musculoskeletal: Strength &  Muscle Tone: decreased Gait & Station: unable to stand Patient leans: N/A  Psychiatric Specialty Exam: Physical Exam as per history and physical   ROS generalized weakness, tired, hard hearing, wheezing and shortness of breath No Fever-chills, No Headache, No changes with Vision or hearing, reports vertigo No problems swallowing food or Liquids, No Chest pain, Cough  No Abdominal pain, No Nausea or Vommitting, Bowel movements are regular, No Blood in stool or Urine, No dysuria, No new skin rashes or bruises, No new joints pains-aches,  No new weakness, tingling, numbness in any extremity, No recent weight gain or loss, No polyuria, polydypsia or polyphagia,   A full 10 point Review of Systems was done, except as stated above, all other Review of Systems were negative.  Blood pressure 160/121, pulse 98, temperature 97.5 F (36.4 C), temperature source Axillary, resp. rate 17, SpO2 100 %.There is no height or weight on file to calculate BMI.  General Appearance: Disheveled  Eye Sport and exercise psychologist::  Fair  Speech:  Clear and Coherent and Slow  Volume:  Decreased  Mood:  Anxious and Depressed  Affect:  Constricted and Depressed  Thought Process:  Coherent and Goal Directed  Orientation:  Full (Time, Place, and Person)  Thought Content:  Rumination  Suicidal Thoughts:  Yes.  without intent/plan  Homicidal Thoughts:  No  Memory:  Immediate;   Fair Recent;   Fair  Judgement:  Impaired  Insight:  Lacking  Psychomotor Activity:  Decreased and Restlessness  Concentration:  Fair  Recall:  [employer] of Knowledge:Fair  Language: Good  Akathisia:  Negative  Handed:  Right  AIMS (if indicated):     Assets:  Agricultural consultant Housing Leisure Time Resilience Social Support  ADL's:   Impaired  Cognition: Impaired,  Mild  Sleep:      Medical Decision Making: New problem, with additional work up planned, Review of Psycho-Social Stressors (1), Review or order clinical lab tests (1), Established Problem, Worsening (2), Review of Last Therapy Session (1), Review or order medicine tests (1), Review of Medication Regimen & Side Effects (2) and Review of New Medication or Change in Dosage (2)  Treatment Plan Summary: Patient presented with symptoms of depression and suicidal ideation and noncompliant with hemodialysis reportedly suffering with end-stage renal disease and homelessness. Daily contact with patient to assess and evaluate symptoms and progress in treatment and Medication management  Plan: Safety concerns: Safety sitter Male restart home psychiatric medication when patient is more stable after hemodialysis Psychiatric social service will contact his a previous providers regarding this of medications Recommend psychiatric Inpatient admission when medically cleared. Supportive therapy provided about ongoing stressors. Appreciate psychiatric consultation Please contact [phone] or [phone] if needs further assistance  Disposition: Patient meets criteria for acute psychiatric hospitalization when medically stable.    Esabella Stockinger,JANARDHAHA R. 04/28/2015 12:17 PM

## 2015-04-28 NOTE — Consult Note (Signed)
Reason for Consult: To manage dialysis and dialysis related needs Referring Physician: [name] is an 64 y.o. male with PMhx significant for HTN, DM, COPD, CAD and apparently medical noncompliance.  He also has ESRD- dialyses at the [facility] on [address]- phone number [phone].  They reported to me that he was there last on July 30th.  He presented to the ER today with SOB and facial swelling and actually told the ER that he last underwent HD 3-4 months ago.  He has a PC in place but actually has a reasonable looking AVF as well.  They told me at the OP unit that he had been refusing cannulation of that AVF as an OP.  He is really unable to tell me much at all    Dialyzes at [place] 80 kg. HD Bath 3K, 3calc, Dialyzer 200, Heparin none. Access orders to cannulate AVF since April- been refusing.  hectorol 2 mcg per tx- does not look like he is on ESA  Past Medical History  Diagnosis Date  . Cancer   . Arthritis   . Renal disorder   . Hypertension   . Diabetes mellitus without complication   . Asthma   . COPD (chronic obstructive pulmonary disease)   . Coronary artery disease     Past Surgical History  Procedure Laterality Date  . No past surgeries    . Insertion of dialysis catheter      History reviewed. No pertinent family history.  Social History:  reports that he has never smoked. He has never used smokeless tobacco. He reports that he does not drink alcohol or use illicit drugs.  Allergies:  Allergies  Allergen Reactions  . Penicillins Other (See Comments)    unsure    Medications: I have reviewed the patient's current medications.   Results for orders placed or performed during the hospital encounter of 04/28/15 (from the past 48 hour(s))  I-stat chem 8, ED  (not at [facility])     Status: Abnormal   Collection Time: 04/28/15  9:22 AM  Result Value Ref Range   Sodium 139 135 - 145 mmol/L   Potassium 5.1  3.5 - 5.1 mmol/L   Chloride 106 101 - 111 mmol/L   BUN 76 (H) 6 - 20 mg/dL   Creatinine, Ser 14.20 (H) 0.61 - 1.24 mg/dL   Glucose, Bld 98 65 - 99 mg/dL   Calcium, Ion 0.98 (L) 1.13 - 1.30 mmol/L   TCO2 17 0 - 100 mmol/L   Hemoglobin 14.3 13.0 - 17.0 g/dL   HCT 42.0 39.0 - 52.0 %  Comprehensive metabolic panel     Status: Abnormal   Collection Time: 04/28/15  9:24 AM  Result Value Ref Range   Sodium 140 135 - 145 mmol/L   Potassium 4.9 3.5 - 5.1 mmol/L   Chloride 104 101 - 111 mmol/L   CO2 17 (L) 22 - 32 mmol/L   Glucose, Bld 102 (H) 65 - 99 mg/dL   BUN 77 (H) 6 - 20 mg/dL   Creatinine, Ser 14.59 (H) 0.61 - 1.24 mg/dL   Calcium 8.2 (L) 8.9 - 10.3 mg/dL   Total Protein 7.5 6.5 - 8.1 g/dL   Albumin 3.7 3.5 - 5.0 g/dL   AST 64 (H) 15 - 41 U/L   ALT 61 17 - 63 U/L   Alkaline Phosphatase 89 38 - 126 U/L   Total Bilirubin 1.1 0.3 - 1.2  mg/dL   GFR calc non Af Amer 3 (L) >60 mL/min   GFR calc Af Amer 4 (L) >60 mL/min    Comment: (NOTE) The eGFR has been calculated using the CKD EPI equation. This calculation has not been validated in all clinical situations. eGFR's persistently <60 mL/min signify possible Chronic Kidney Disease.    Anion gap 19 (H) 5 - 15  CBC WITH DIFFERENTIAL     Status: Abnormal   Collection Time: 04/28/15  9:24 AM  Result Value Ref Range   WBC 8.6 4.0 - 10.5 K/uL   RBC 4.28 4.22 - 5.81 MIL/uL   Hemoglobin 11.3 (L) 13.0 - 17.0 g/dL    Comment: SPECIMEN CHECKED FOR CLOTS REPEATED TO VERIFY    HCT 35.4 (L) 39.0 - 52.0 %   MCV 82.7 78.0 - 100.0 fL   MCH 26.4 26.0 - 34.0 pg   MCHC 31.9 30.0 - 36.0 g/dL   RDW 18.6 (H) 11.5 - 15.5 %   Platelets 236 150 - 400 K/uL   Neutrophils Relative % 83 (H) 43 - 77 %   Neutro Abs 7.1 1.7 - 7.7 K/uL   Lymphocytes Relative 8 (L) 12 - 46 %   Lymphs Abs 0.7 0.7 - 4.0 K/uL   Monocytes Relative 8 3 - 12 %   Monocytes Absolute 0.7 0.1 - 1.0 K/uL   Eosinophils Relative 2 0 - 5 %   Eosinophils Absolute 0.1 0.0 - 0.7 K/uL    Basophils Relative 0 0 - 1 %   Basophils Absolute 0.0 0.0 - 0.1 K/uL    Dg Chest Port 1 View  04/28/2015   CLINICAL DATA:  Shortness of breath, congestion, end-stage renal disease on dialysis, diabetes mellitus, hypertension, COPD, coronary artery disease  EXAM: PORTABLE CHEST - 1 VIEW  COMPARISON:  Portable exam 1003 hours compared to 02/01/2015  FINDINGS: RIGHT jugular central venous catheter with tip projecting over RIGHT atrium.  Enlargement of cardiac silhouette with pulmonary vascular congestion.  Tortuous aorta.  Chronic accentuation of interstitial markings in the mid to lower lungs question minimal chronic pulmonary edema or fluid overload.  No segmental consolidation, pleural effusion or pneumothorax.  IMPRESSION: Enlargement of cardiac silhouette with pulmonary vascular congestion and question minimal chronic pulmonary edema/fluid overload.   Electronically Signed   By: Lavonia Dana M.D.   On: 04/28/2015 09:20    ROS: Really unable to get information out of him due to uremia- not clear what his MS is at baseline Blood pressure 160/121, pulse 98, temperature 97.5 F (36.4 C), temperature source Axillary, resp. rate 17, SpO2 100 %. General appearance: mild distress and slowed mentation Head: Normocephalic, without obvious abnormality, atraumatic, significant swelling to head Eyes: conjunctivae/corneas clear. PERRL, EOM's intact. Fundi benign. Neck: JVD - 7 cm above sternal notch, no adenopathy, no carotid bruit, supple, symmetrical, trachea midline and thyroid not enlarged, symmetric, no tenderness/mass/nodules Resp: diminished breath sounds bibasilar Cardio: regular rate and rhythm, S1, S2 normal, no murmur, click, rub or gallop GI: abdominal wall edema Extremities: edema pitting edema left upper arm avf with good thrill and bruit  Assessment/Plan: 64 year old BM with multiple medical and social issues who presents with no dialysis for 6 days- uremia and volume overload 1 Volume  overload but most pronounced is his facial and head edema.  This is likely due to venous congestion from his PC- actually ideally we should remove it as it would give him no choice but for [place] to use the AVF which  should be adequate 2 ESRD: noncompliance- home unit is [facility] and will go back there upon discharge.  Will plan for HD in AM as very busy schedule in inpatient dialysis today.  Will use AVF 3 Hypertension: likely due to volume- should come down with HD 4. Anemia of ESRD: does not appear to be an issue at this time - is not on ESA as OP 5. Metabolic Bone Disease: continue home IV hectorol 6. Social issues- possibly homeless- will complicate  Thank you for this consult- we will follow   Beatrice A 04/28/2015, 2:34 PM

## 2015-04-28 NOTE — Progress Notes (Signed)
ANTICOAGULATION CONSULT NOTE - Initial Consult  Pharmacy Consult:  Heparin Indication: Superior vena cava syndrome  Allergies  Allergen Reactions  . Penicillins Other (See Comments)    unsure    Patient Measurements: Weight: 186 lb (84.369 kg) Heparin Dosing Weight: 84 kg  Vital Signs: Temp: 97.9 F (36.6 C) (08/05 2137) Temp Source: Axillary (08/05 1921) BP: 147/104 mmHg (08/05 2137) Pulse Rate: 105 (08/05 2137)  Labs:  Recent Labs  04/28/15 0922 04/28/15 0924 04/28/15 1428 04/28/15 1927  HGB 14.3 11.3*  --   --   HCT 42.0 35.4*  --   --   PLT  --  236  --   --   CREATININE 14.20* 14.59*  --   --   TROPONINI  --   --  0.03 0.04*    CrCl cannot be calculated (Unknown ideal weight.).   Medical History: Past Medical History  Diagnosis Date  . Cancer   . Arthritis   . Renal disorder   . Hypertension   . Diabetes mellitus without complication   . Asthma   . COPD (chronic obstructive pulmonary disease)   . Coronary artery disease       Assessment: 49 YOM with history of blood clots in his upper extremities and was on blood thinner in the past.  Per documentation, patient is unaware of drug name and for how long he was on this therapy.  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  He has ESRD and his last HD session was several weeks ago.  Baseline labs and home meds reviewed.  Noted that patient has been refusing heparin SQ injections.   Goal of Therapy:  Heparin level 0.3-0.7 units/ml Monitor platelets by anticoagulation protocol: Yes    Plan:  - D/C heparin SQ - Heparin 4000 units IV bolus x 1, then - Heparin gtt at 1100 units/hr - Check 8 hr HL - Daily HL / CBC - F/U height   Yanelli Zapanta D. Mina Marble, PharmD, BCPS Pager:  [phone] 04/28/2015, 10:08 PM

## 2015-04-29 ENCOUNTER — Inpatient Hospital Stay (HOSPITAL_COMMUNITY): Payer: Medicaid Other

## 2015-04-29 DIAGNOSIS — I871 Compression of vein: Secondary | ICD-10-CM

## 2015-04-29 DIAGNOSIS — F314 Bipolar disorder, current episode depressed, severe, without psychotic features: Secondary | ICD-10-CM

## 2015-04-29 DIAGNOSIS — J449 Chronic obstructive pulmonary disease, unspecified: Secondary | ICD-10-CM

## 2015-04-29 DIAGNOSIS — N186 End stage renal disease: Secondary | ICD-10-CM

## 2015-04-29 DIAGNOSIS — J9620 Acute and chronic respiratory failure, unspecified whether with hypoxia or hypercapnia: Secondary | ICD-10-CM

## 2015-04-29 DIAGNOSIS — M7989 Other specified soft tissue disorders: Secondary | ICD-10-CM

## 2015-04-29 DIAGNOSIS — I82722 Chronic embolism and thrombosis of deep veins of left upper extremity: Secondary | ICD-10-CM

## 2015-04-29 DIAGNOSIS — Z992 Dependence on renal dialysis: Secondary | ICD-10-CM

## 2015-04-29 DIAGNOSIS — I120 Hypertensive chronic kidney disease with stage 5 chronic kidney disease or end stage renal disease: Secondary | ICD-10-CM

## 2015-04-29 LAB — BASIC METABOLIC PANEL
ANION GAP: 19 — AB (ref 5–15)
BUN: 82 mg/dL — ABNORMAL HIGH (ref 6–20)
CO2: 14 mmol/L — AB (ref 22–32)
Calcium: 8.2 mg/dL — ABNORMAL LOW (ref 8.9–10.3)
Chloride: 105 mmol/L (ref 101–111)
Creatinine, Ser: 14.65 mg/dL — ABNORMAL HIGH (ref 0.61–1.24)
GFR calc Af Amer: 4 mL/min — ABNORMAL LOW (ref >60)
GFR calc non Af Amer: 3 mL/min — ABNORMAL LOW (ref >60)
GLUCOSE: 71 mg/dL (ref 65–99)
Potassium: 5.2 mmol/L — ABNORMAL HIGH (ref 3.5–5.1)
Sodium: 138 mmol/L (ref 135–145)

## 2015-04-29 LAB — BLOOD GAS, ARTERIAL
Acid-base deficit: 1.7 mmol/L (ref 0.0–2.0)
Bicarbonate: 21.7 mEq/L (ref 20.0–24.0)
DRAWN BY: 283381
O2 Content: 2 L/min
O2 Saturation: 98.2 %
PH ART: 7.453 — AB (ref 7.350–7.450)
PO2 ART: 95.4 mmHg (ref 80.0–100.0)
Patient temperature: 98.6
TCO2: 22.7 mmol/L (ref 0–100)
pCO2 arterial: 31.6 mmHg — ABNORMAL LOW (ref 35.0–45.0)

## 2015-04-29 LAB — MRSA PCR SCREENING: MRSA BY PCR: POSITIVE — AB

## 2015-04-29 LAB — HEPARIN LEVEL (UNFRACTIONATED)
Heparin Unfractionated: 0.12 IU/mL — ABNORMAL LOW (ref 0.30–0.70)
Heparin Unfractionated: 0.14 IU/mL — ABNORMAL LOW (ref 0.30–0.70)

## 2015-04-29 LAB — TROPONIN I: TROPONIN I: 0.04 ng/mL — AB (ref <0.031)

## 2015-04-29 LAB — CBC
HCT: 34.8 % — ABNORMAL LOW (ref 39.0–52.0)
HEMOGLOBIN: 11.3 g/dL — AB (ref 13.0–17.0)
MCH: 27.1 pg (ref 26.0–34.0)
MCHC: 32.5 g/dL (ref 30.0–36.0)
MCV: 83.5 fL (ref 78.0–100.0)
Platelets: 254 10*3/uL (ref 150–400)
RBC: 4.17 MIL/uL — AB (ref 4.22–5.81)
RDW: 18.9 % — AB (ref 11.5–15.5)
WBC: 9.5 10*3/uL (ref 4.0–10.5)

## 2015-04-29 LAB — HEMOGLOBIN A1C
HEMOGLOBIN A1C: 5.7 % — AB (ref 4.8–5.6)
Mean Plasma Glucose: 117 mg/dL

## 2015-04-29 MED ORDER — CHLORHEXIDINE GLUCONATE CLOTH 2 % EX PADS
6.0000 | MEDICATED_PAD | Freq: Every day | CUTANEOUS | Status: DC
  Administered 2015-04-29 – 2015-05-02 (×4): 6 via TOPICAL

## 2015-04-29 MED ORDER — ACETAMINOPHEN 325 MG PO TABS
650.0000 mg | ORAL_TABLET | Freq: Once | ORAL | Status: AC
  Administered 2015-04-29: 650 mg via ORAL
  Filled 2015-04-29: qty 2

## 2015-04-29 MED ORDER — MUPIROCIN 2 % EX OINT
1.0000 "application " | TOPICAL_OINTMENT | Freq: Two times a day (BID) | CUTANEOUS | Status: DC
  Administered 2015-04-29 – 2015-05-03 (×8): 1 via NASAL
  Filled 2015-04-29 (×2): qty 22

## 2015-04-29 MED ORDER — DOXERCALCIFEROL 4 MCG/2ML IV SOLN
INTRAVENOUS | Status: AC
  Administered 2015-04-29: 2 ug via INTRAVENOUS
  Filled 2015-04-29: qty 2

## 2015-04-29 NOTE — Progress Notes (Signed)
Subjective:   Rapid response called for hypoxia last night. Seen in HD- using AVF- went to clean PC and it did have drainage Objective Vital signs in last 24 hours: Filed Vitals:   04/29/15 0837 04/29/15 0907 04/29/15 0937 04/29/15 1007  BP: 164/83 197/87 167/89 185/92  Pulse: 116 93 100 83  Temp:      TempSrc:      Resp:      Height:      Weight:      SpO2:       Weight change:   Intake/Output Summary (Last 24 hours) at 04/29/15 1027 Last data filed at 04/29/15 0634  Gross per 24 hour  Intake    180 ml  Output    550 ml  Net   -370 ml   Dialyzes at [place] 80 kg. HD Bath 3K, 3calc, Dialyzer 200, Heparin none. Access orders to cannulate AVF since April- been refusing. hectorol 2 mcg per tx- does not look like he is on ESA   Assessment/Plan: 64 year old BM with multiple medical and social issues who presents with no dialysis for 6 days- uremia and volume overload 1 Volume overload but most pronounced is his facial and head edema. This is likely due to venous congestion from his PC- actually ideally we should remove it  And now with drainage to PC definitely needs to be removed as it would give him no choice but for [place] to use the AVF which should be adequate 2 ESRD: noncompliance- home unit is [facility] and will go back there upon discharge. Will plan for HD today and again Monday.  Will use AVF 3 Hypertension: likely due to volume- should come down with HD 4. Anemia of ESRD: does not appear to be an issue at this time - is not on ESA as OP 5. Metabolic Bone Disease: continue home IV hectorol 6. Social issues- possibly homeless- will complicate   Linlee Cromie A    Labs: Basic Metabolic Panel:  Recent Labs Lab 04/28/15 0922 04/28/15 0924 04/29/15 0420  NA 139 140 138  K 5.1 4.9 5.2*  CL 106 104 105  CO2  --  17* 14*  GLUCOSE 98 102* 71  BUN 76* 77* 82*  CREATININE 14.20* 14.59* 14.65*  CALCIUM  --  8.2* 8.2*   Liver Function  Tests:  Recent Labs Lab 04/28/15 0924  AST 64*  ALT 61  ALKPHOS 89  BILITOT 1.1  PROT 7.5  ALBUMIN 3.7   No results for input(s): LIPASE, AMYLASE in the last 168 hours. No results for input(s): AMMONIA in the last 168 hours. CBC:  Recent Labs Lab 04/28/15 0922 04/28/15 0924 04/29/15 0420  WBC  --  8.6 9.5  NEUTROABS  --  7.1  --   HGB 14.3 11.3* 11.3*  HCT 42.0 35.4* 34.8*  MCV  --  82.7 83.5  PLT  --  236 254   Cardiac Enzymes:  Recent Labs Lab 04/28/15 1428 04/28/15 1927 04/29/15 0137  TROPONINI 0.03 0.04* 0.04*   CBG: No results for input(s): GLUCAP in the last 168 hours.  Iron Studies: No results for input(s): IRON, TIBC, TRANSFERRIN, FERRITIN in the last 72 hours. Studies/Results: Dg Chest Port 1 View  04/28/2015   CLINICAL DATA:  Shortness of breath, congestion, end-stage renal disease on dialysis, diabetes mellitus, hypertension, COPD, coronary artery disease  EXAM: PORTABLE CHEST - 1 VIEW  COMPARISON:  Portable exam 1003 hours compared to 02/01/2015  FINDINGS: RIGHT  jugular central venous catheter with tip projecting over RIGHT atrium.  Enlargement of cardiac silhouette with pulmonary vascular congestion.  Tortuous aorta.  Chronic accentuation of interstitial markings in the mid to lower lungs question minimal chronic pulmonary edema or fluid overload.  No segmental consolidation, pleural effusion or pneumothorax.  IMPRESSION: Enlargement of cardiac silhouette with pulmonary vascular congestion and question minimal chronic pulmonary edema/fluid overload.   Electronically Signed   By: Lavonia Dana M.D.   On: 04/28/2015 09:20   Medications: Infusions: . heparin 1,100 Units/hr (04/28/15 2253)    Scheduled Medications: . Chlorhexidine Gluconate Cloth  6 each Topical Q0600  . doxercalciferol  2 mcg Intravenous Q T,Th,Sa-HD  . mupirocin ointment  1 application Nasal BID    have reviewed scheduled and prn medications.  Physical Exam: General: falls asleep  easily on HD Heart: RRR Lungs: dec BS bilat Abdomen: soft, non tender Extremities: some pitting edema Dialysis Access: right sided PC with drainage- but also left upper arm AVF accessed    04/29/2015,10:27 AM  LOS: 1 day

## 2015-04-29 NOTE — Progress Notes (Signed)
Date: 04/29/2015  Patient name: Noah Lewis  Medical record number: [id number]  Date of birth: [birth year]   I have seen and evaluated Noah Lewis and discussed their care with the Residency Team. Noah Lewis has a h/o ESRD on HD, HTN, chronic nonmalignant SVC syndrome, chronic LUE DVT, and reported Bipolar dz. He receives his care in [facility] so not all records are available. He had a cath in 2012 at [facility] that showed no CAD and diffuse global hypokinesis with an EF 40-45%.  Per notes, he came to the ED bc the social worker saw him and thought he looked worse and needed care and called 911. I saw him today in HD. Hx is limited - noise in HD, pt a bit HOH, falls asleep quite easily. Pt states breathing better today. He plans to live in [place] bc this is his home but has no place to live in [place]. Noah Lewis his brother had the same eye issue and had laser surgery. Noah Lewis did see an eye doctor and surgery was rec. Pt states he is independent and doesn't like to be told what to do.   Filed Vitals:   04/29/15 1207  BP: 105/29  Pulse: 116  Temp:   Resp:    Afebrile RR 24 O2 sat 100% even on RA  Gen : appeared asleep with eyes open. Morbid obesity.  HEENT : face edematous with pitting edema. Tongue is nl sized but quite red. Cannot see further. Mucous mem dry. Eyes injected R>L. Soft tissue growths on both eyes - laterally.  Neck large with edema Chest - pitting edema of L Pul loud breathing, no overt distress. Able to speak but not in full sentences - not b/c dyspnea but more lack of interest / sleeping ABD non tender. Pitting edema on L LUE ; brawny skin, thick and tough likely indicating long standing edema LE no edema, dry skin Pysch - alert and talking but seems to drift off  Assessment and Plan: I have seen and evaluated the patient as outlined above. I agree with the formulated Assessment and Plan as detailed in the residents' admission note, with the following changes:    Noah Lewis has not complied with HD and per notes, this is not new. He has chronic non malignant SVC and UE DVT. His respiratory status appears unstable but he is tolerating it quite well indicating it is chronic as I do not think he has good reserves to tolerate it if it were acute. He has mental illness, not well defined, and is currently homeless complicating his care. Pysch has seen Noah Lewis for SI and rec inpt pyshc admission once stable, sitter, and restarting home meds once they are determined. His prognosis is quite poor.  1. Acute on chronic (presume) respiratory failure - Some degree of vol overload 2/2 HD non compliance. Also likely OSA / OHS with chronic hypercapnia. He is getting  HD and we are checking an ABG. If he is hypercapnic, he will need cautious O2 - just to maintain >90%. He might also need NIMV.   2. Chronic non malignant SVC - Dr Moshe Lewis would like to remove the [facility] as might be contributing. Vol mgmt per HD  3. Chronic UE DVT - we cannot R/O acute component and he would not tolerate another stress. Heparin until doppler back.  4. SI / homelessness - appreciate pysch input. Consult care mgmt  5. GOC - We could not have this conversation this AM as was in HD,  possible uremic, had just expressed SI, and may be hypercapnic. Once stable, we will need to have Noah Lewis as pt will need to comply with HD and other tx in order to have any medical stability.  Bartholomew Crews, MD 8/6/20161:04 PM

## 2015-04-29 NOTE — Progress Notes (Signed)
PT Cancellation Note  Patient Details Name: Noah Lewis MRN: [id number] DOB: Jun 08, 1951   Cancelled Treatment:    Reason Eval/Treat Not Completed: Fatigue/lethargy limiting ability to participate (patient just finished HD and too tired today)   Shanna Cisco 04/29/2015, 2:16 PM

## 2015-04-29 NOTE — Progress Notes (Signed)
Subjective: Noah Lewis was seen in the dialysis unit today. He appeared very uncomfortable and was minimally cooperative to questions and exam. However, he was medically stable.  Objective: Vital signs in last 24 hours: Filed Vitals:   04/29/15 1007 04/29/15 1037 04/29/15 1107 04/29/15 1137  BP: 185/92 172/43 192/66 173/71  Pulse: 83 114 106 111  Temp:      TempSrc:      Resp:      Height:      Weight:      SpO2:       Weight change:   Intake/Output Summary (Last 24 hours) at 04/29/15 1208 Last data filed at 04/29/15 1610  Gross per 24 hour  Intake    180 ml  Output    550 ml  Net   -370 ml   Physical Exam  GENERAL- Lying on his side in the hemodialysis unit, somewhat somnolent, and appearing uncomfortable HEENT- significant swelling of face with peri-oribtal edema, eyes appear blood shot bilaterally, both left and right eyes have a fleshy growth on lateral conjuctiva, not encroaching on cornea. CARDIAC- RRR, no murmurs, rubs or gallops, slight pitting on anterior chest, worse on the left. RESP- Moving equal volumes of air, and coarse expiratory rhonchi bilaterally with upper airway transmitted sounds. ABDOMEN- Soft, nontender, no guarding or rebound, no palpable masses or organomegaly, bowel sounds present. NEURO- Minimally alert. Oriented to name, but not answering other questions. Difficulty hearing EXTREMITIES- Dry thickened skin bilat lower extremities, upper extremities- tense +1 pitting edema- left upper extremity, tender with increased pigmentation, pulse 2+, symmetric, no pedal edema. Toenails are long and fused. Receiving HD through fistula SKIN- No redness at portacath site or pain on palpation. 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  Lab Results: Basic Metabolic Panel:  Recent Labs Lab 04/28/15 0924 04/29/15 0420  NA 140 138  K 4.9 5.2*  CL 104 105  CO2 17* 14*  GLUCOSE 102* 71  BUN 77* 82*  CREATININE 14.59* 14.65*  CALCIUM 8.2*  8.2*   Liver Function Tests:  Recent Labs Lab 04/28/15 0924  AST 64*  ALT 61  ALKPHOS 89  BILITOT 1.1  PROT 7.5  ALBUMIN 3.7   CBC:  Recent Labs Lab 04/28/15 0924 04/29/15 0420  WBC 8.6 9.5  NEUTROABS 7.1  --   HGB 11.3* 11.3*  HCT 35.4* 34.8*  MCV 82.7 83.5  PLT 236 254   Cardiac Enzymes:  Recent Labs Lab 04/28/15 1428 04/28/15 1927 04/29/15 0137  TROPONINI 0.03 0.04* 0.04*   Micro Results: Recent Results (from the past 240 hour(s))  MRSA PCR Screening     Status: Abnormal   Collection Time: 04/28/15  3:50 PM  Result Value Ref Range Status   MRSA by PCR POSITIVE (A) NEGATIVE Final    Comment:        The GeneXpert MRSA Assay (FDA approved for NASAL specimens only), is one component of a comprehensive MRSA colonization surveillance program. It is not intended to diagnose MRSA infection nor to guide or monitor treatment for MRSA infections. RESULT CALLED TO, READ BACK BY AND VERIFIED WITH: CHARGE RN Percell Boston [id number] @[phone]  THANEY    Studies/Results: Dg Chest Port 1 View  04/28/2015   CLINICAL DATA:  Shortness of breath, congestion, end-stage renal disease on dialysis, diabetes mellitus, hypertension, COPD, coronary artery disease  EXAM: PORTABLE CHEST - 1 VIEW  COMPARISON:  Portable exam 1003 hours compared to 02/01/2015  FINDINGS: RIGHT jugular central venous catheter with tip  projecting over RIGHT atrium.  Enlargement of cardiac silhouette with pulmonary vascular congestion.  Tortuous aorta.  Chronic accentuation of interstitial markings in the mid to lower lungs question minimal chronic pulmonary edema or fluid overload.  No segmental consolidation, pleural effusion or pneumothorax.  IMPRESSION: Enlargement of cardiac silhouette with pulmonary vascular congestion and question minimal chronic pulmonary edema/fluid overload.   Electronically Signed   By: Lavonia Dana M.D.   On: 04/28/2015 09:20   Medications:  Scheduled Meds: . Chlorhexidine Gluconate  Cloth  6 each Topical Q0600  . doxercalciferol  2 mcg Intravenous Q T,Th,Sa-HD  . mupirocin ointment  1 application Nasal BID   Continuous Infusions: . 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 Based on records from [facility], this appears to be longstanding and not acutely worsening. However, looking back at [facility], it was not present in 2014. He reports that the facial swelling has been present for many months. [facility] reports he was on anticoagulation in the past, but it was discontinued due to non-adherence. Etiology is unclear, but may require workup for lung malignancy. Upper extremity thrombosis are also possible emanating from his portacathether, and he does have a history of DVTs. . It is possible he his retaining with a respiratory acidosis. - Obtain ABG, if retaining, requires BiPaP - Upper extremity dopplers pending - Heparin IV - Consult IR to remove portacath, who will likely see him Monday.  ESRD on HD: According the [name], last seen at HD about two weeks ago. He reports that he gets his HD via his portacath, not his fistula. However, with likely removal of portacath on Monday, he was able to obtain HD through his fistula today. Creatinine was 14 on admission and has remained there this morning.  HTN: Bp elevated, likely also due to fluid overload. Home meds- Labetalol 400mg  TID. - Consider starting Bp meds after first session of dialysis  Suicidal ideation and BPD: Seen by Dr. Louretta Shorten. There is no previous history of hospitalization and he cannot recall the medications he was on for what the patient says is bipolar depression. Will be further assessed once he mentating more consistently after dialysis. Per the psychiatrists note, he meets  criteria for psychiatric hospitalization.  Goals of Care:  May require goals of care discussion once he is mentating more consistently after dialysis treatments.  COPD:  Continue duonebs as needed.  Dispo: Disposition is deferred at this time, awaiting improvement of current medical problems.  Anticipated discharge in approximately 4-5 day(s).   The patient does not have a current PCP (Provider Not In System) and does not need an [facility] hospital follow-up appointment after discharge.  The patient does have transportation limitations that hinder transportation to clinic appointments.  .Services Needed at time of discharge: Y = Yes, Blank = No PT:   OT:   RN:   Equipment:   Other:     LOS: 1 day   Liberty Handy, MD 04/29/2015, 12:08 PM

## 2015-04-29 NOTE — Procedures (Signed)
Patient was seen on dialysis and the procedure was supervised.  BFR 250  Via AVF- small needles BP is  185/92.   Patient appears to be tolerating treatment well  Noah Lewis A 04/29/2015

## 2015-04-29 NOTE — Progress Notes (Signed)
VASCULAR LAB PRELIMINARY  PRELIMINARY  PRELIMINARY  PRELIMINARY  Left upper extremity venous duplex completed.    Preliminary report:  There is no obvious evidence of DVT or SVT noted in the left upper extremity.  Technically difficult and limited study secondary to extreme edema and bandages.  Noah Lewis, RVT 04/29/2015, 6:02 PM

## 2015-04-29 NOTE — Progress Notes (Signed)
ANTICOAGULATION CONSULT NOTE - Follow Up Consult  Pharmacy Consult for Heparin  Indication: Superior vena cava syndrome   Allergies  Allergen Reactions  . Penicillins Other (See Comments)    unsure    Patient Measurements: Height: 5\' 7"  (170.2 cm) Weight: 176 lb 2.4 oz (79.9 kg) (bed scale) IBW/kg (Calculated) : 66.1  Vital Signs: Temp: 98.2 F (36.8 C) (08/06 2046) Temp Source: Oral (08/06 2046) BP: 127/78 mmHg (08/06 2046) Pulse Rate: 83 (08/06 2046)  Labs:  Recent Labs  04/28/15 0922 04/28/15 0924 04/28/15 1428 04/28/15 1927 04/29/15 0137 04/29/15 0420 04/29/15 0747 04/29/15 2153  HGB 14.3 11.3*  --   --   --  11.3*  --   --   HCT 42.0 35.4*  --   --   --  34.8*  --   --   PLT  --  236  --   --   --  254  --   --   HEPARINUNFRC  --   --   --   --   --   --  0.12* 0.14*  CREATININE 14.20* 14.59*  --   --   --  14.65*  --   --   TROPONINI  --   --  0.03 0.04* 0.04*  --   --   --     Estimated Creatinine Clearance: 5.2 mL/min (by C-G formula based on Cr of 14.65).   Assessment: Heparin level remains sub-therapeutic at 0.14 after rate increase, RN reports some IV beeping, no other issues.   Goal of Therapy:  Heparin level 0.3-0.7 units/ml Monitor platelets by anticoagulation protocol: Yes   Plan:  -Increase heparin to 1600 units/hr (smaller increase with possibility of some infusion issues previously) -0700 HL -Daily CBC/HL -Monitor for bleeding  Narda Bonds 04/29/2015,11:07 PM

## 2015-04-29 NOTE — Progress Notes (Addendum)
Called for RRT RN consult per Charge RN Nikki at [phone] regarding Pt with facial swelling oxygenation status. Per RN Pt with facial swelling, unchanged since admit this AM. Primary concern at this time is his 02 status, earlier this morning Pt 02 sats while awake 98% on RA,assesssed with 02 as low as 93% while sleeping however Pt awakened in a confused state and ripped off 02 sensor and PIV. I suspect his 02 sats may be dropping lower than assessed while sleeping. Pt assessed at bedside at 0245. Awake sitting on side of bed, denies pain, follows commands, complains of mild SOB. RR 14, Po2 98% on RA, Lung sounds course, diminished in bilateral bases. Upper lobes scattered diffuse rhonchi. Upper airway  auscultated, no wheezing heard but very course. VSS and charted in flowsheet. Pt placed on 3 LNC, RN advised to maintain HOB above 30 degrees and monitor Pt closely. Pt already on continuous pulse oximeter and in camera room at this time. Per RN Resident MD came to bedside about an hour prior to my arrival to assess him and had no new orders at that time. Advised RN to notify Primary Provider and myself if Pt worsens including becoming more SOB or 02 sats drop, Pt may need transfer to higher level of care if his condition worsens.  Chart notes reviewed including H & P per Dr Arlyce Dice (Resident) including history of edema in face and left upper chest for 6 months that does not improve after HD.  Per H&P concern exist for SVC possibly related to malignancy, or DVT? Plan of care per note includes upper extremity doppler (ordered but not completed as of yet) and possibly a CT of chest to assess anatomy, as well as keeping Pt HOB elevated.  Progress note from Dr. Mylinda Latina Psych MD which included a psychiatric assessment positive for suicidal ideation on 8/5. Suicide sitter placed in room for Pt safety.  Nephrology note per Dr. Moshe Cipro also reviewed which states his facial and head swelling is likely due to  venous congestion from his PC and it would be ideal to remove it. Discussed chart review findings with bedside RN and Agricultural consultant. Floor RNs to follow up on [place] for upper extremity to be done today.

## 2015-04-30 LAB — CBC
HCT: 33.9 % — ABNORMAL LOW (ref 39.0–52.0)
Hemoglobin: 11.1 g/dL — ABNORMAL LOW (ref 13.0–17.0)
MCH: 27.3 pg (ref 26.0–34.0)
MCHC: 32.7 g/dL (ref 30.0–36.0)
MCV: 83.3 fL (ref 78.0–100.0)
Platelets: 183 10*3/uL (ref 150–400)
RBC: 4.07 MIL/uL — ABNORMAL LOW (ref 4.22–5.81)
RDW: 18.6 % — AB (ref 11.5–15.5)
WBC: 5.3 10*3/uL (ref 4.0–10.5)

## 2015-04-30 LAB — BASIC METABOLIC PANEL
Anion gap: 14 (ref 5–15)
BUN: 46 mg/dL — AB (ref 6–20)
CHLORIDE: 98 mmol/L — AB (ref 101–111)
CO2: 24 mmol/L (ref 22–32)
Calcium: 8.2 mg/dL — ABNORMAL LOW (ref 8.9–10.3)
Creatinine, Ser: 9.17 mg/dL — ABNORMAL HIGH (ref 0.61–1.24)
GFR calc non Af Amer: 5 mL/min — ABNORMAL LOW (ref >60)
GFR, EST AFRICAN AMERICAN: 6 mL/min — AB (ref >60)
Glucose, Bld: 74 mg/dL (ref 65–99)
POTASSIUM: 4.9 mmol/L (ref 3.5–5.1)
SODIUM: 136 mmol/L (ref 135–145)

## 2015-04-30 LAB — HEPARIN LEVEL (UNFRACTIONATED)
Heparin Unfractionated: 0.14 IU/mL — ABNORMAL LOW (ref 0.30–0.70)
Heparin Unfractionated: 0.41 IU/mL (ref 0.30–0.70)

## 2015-04-30 MED ORDER — ACETAMINOPHEN 325 MG PO TABS
325.0000 mg | ORAL_TABLET | Freq: Four times a day (QID) | ORAL | Status: DC | PRN
  Administered 2015-04-30 – 2015-05-03 (×3): 325 mg via ORAL
  Filled 2015-04-30 (×3): qty 1

## 2015-04-30 MED ORDER — HEPARIN SODIUM (PORCINE) 5000 UNIT/ML IJ SOLN
5000.0000 [IU] | Freq: Three times a day (TID) | INTRAMUSCULAR | Status: DC
  Filled 2015-04-30 (×2): qty 1

## 2015-04-30 MED ORDER — CAPSAICIN 0.025 % EX CREA
TOPICAL_CREAM | Freq: Two times a day (BID) | CUTANEOUS | Status: DC | PRN
  Filled 2015-04-30 (×2): qty 56.6

## 2015-04-30 NOTE — Progress Notes (Addendum)
ANTICOAGULATION CONSULT NOTE - Follow Up Consult  Pharmacy Consult for Heparin  Indication: Superior vena cava syndrome   Allergies  Allergen Reactions  . Penicillins Other (See Comments)    unsure    Patient Measurements: Height: 5\' 7"  (170.2 cm) Weight: 176 lb 2.4 oz (79.9 kg) (bed scale) IBW/kg (Calculated) : 66.1  Vital Signs: Temp: 98.5 F (36.9 C) (08/07 0950) Temp Source: Oral (08/07 0950) BP: 130/92 mmHg (08/07 0950) Pulse Rate: 78 (08/07 0950)  Labs:  Recent Labs  04/28/15 1423 04/28/15 1428 04/28/15 1927 04/29/15 0137 04/29/15 0420  04/29/15 2153 04/30/15 0707 04/30/15 0758 04/30/15 1810  HGB 11.3*  --   --   --  11.3*  --   --  11.1*  --   --   HCT 35.4*  --   --   --  34.8*  --   --  33.9*  --   --   PLT 236  --   --   --  254  --   --  183  --   --   HEPARINUNFRC  --   --   --   --   --   < > 0.14*  --  0.14* 0.41  CREATININE 14.59*  --   --   --  14.65*  --   --  9.17*  --   --   TROPONINI  --  0.03 0.04* 0.04*  --   --   --   --   --   --   < > = values in this interval not displayed.  Estimated Creatinine Clearance: 8.4 mL/min (by C-G formula based on Cr of 9.17).   Assessment: Heparin level in range this evening at 0.41 units/mL on 1800 units/hr. No bleeding noted.  Goal of Therapy:  Heparin level 0.3-0.7 units/ml Monitor platelets by anticoagulation protocol: Yes   Plan:  -continue heparin at 1800 units/hr -Daily CBC/HL- next level with AM labs to confirm as pt has poor renal function -Monitor for bleeding  Hanley Rispoli D. Cielo Arias, PharmD, BCPS Clinical Pharmacist Pager: [phone] 04/30/2015 7:18 PM
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
## 2015-04-30 NOTE — Progress Notes (Signed)
Called and verified with Dr. Melburn Hake that heparin gtt to be discontinued.  Per Md order, heparin gtt discontinued.  Will continue to monitor the patient.  [employer] RN-BC, WTA.

## 2015-04-30 NOTE — Progress Notes (Signed)
Subjective:   Had HD yest with 5 liters removed- blood pressure still high-  Objective Vital signs in last 24 hours: Filed Vitals:   04/29/15 1704 04/29/15 2046 04/30/15 0500 04/30/15 0950  BP: 134/92 127/78 171/107 130/92  Pulse: 100 83 101 78  Temp: 98.7 F (37.1 C) 98.2 F (36.8 C) 99.6 F (37.6 C) 98.5 F (36.9 C)  TempSrc: Oral Oral Axillary Oral  Resp:  22 20 16   Height:      Weight:      SpO2:  97% 96% 100%   Weight change: 1.031 kg (2 lb 4.4 oz)  Intake/Output Summary (Last 24 hours) at 04/30/15 1151 Last data filed at 04/29/15 1900  Gross per 24 hour  Intake    317 ml  Output   5099 ml  Net  -4782 ml   Dialyzes at [facility] EDW 80 kg. HD Bath 3K, 3calc, Dialyzer 200, Heparin none. Access orders to cannulate AVF since April- been refusing. hectorol 2 mcg per tx- does not look like he is on ESA   Assessment/Plan: 64 year old BM with multiple medical and social issues who presents with no dialysis for 6 days- uremia and volume overload 1 Volume overload but most pronounced is his facial and head edema. This is likely due to venous congestion from his PC- actually ideally we should remove it  And now with drainage to PC definitely needs to be removed as it would give him no choice but for [place] to use the AVF which should be adequate- order written for IR to remove 2 ESRD: noncompliance- home unit is [place] [facility] and will go back there upon discharge. Will plan for HD  again Monday and Tuesday to get on schedule.  Will use AVF 3 Hypertension: likely due to volume- should come down with HD 4. Anemia of ESRD: does not appear to be an issue at this time - is not on ESA as OP 5. Metabolic Bone Disease: continue home IV hectorol 6. Social issues- possibly homeless- will complicate   Asta Corbridge A    Labs: Basic Metabolic Panel:  Recent Labs Lab 04/28/15 0924 04/29/15 0420 04/30/15 0707  NA 140 138 136  K 4.9 5.2* 4.9  CL 104 105 98*  CO2  17* 14* 24  GLUCOSE 102* 71 74  BUN 77* 82* 46*  CREATININE 14.59* 14.65* 9.17*  CALCIUM 8.2* 8.2* 8.2*   Liver Function Tests:  Recent Labs Lab 04/28/15 0924  AST 64*  ALT 61  ALKPHOS 89  BILITOT 1.1  PROT 7.5  ALBUMIN 3.7   No results for input(s): LIPASE, AMYLASE in the last 168 hours. No results for input(s): AMMONIA in the last 168 hours. CBC:  Recent Labs Lab 04/28/15 0924 04/29/15 0420 04/30/15 0707  WBC 8.6 9.5 5.3  NEUTROABS 7.1  --   --   HGB 11.3* 11.3* 11.1*  HCT 35.4* 34.8* 33.9*  MCV 82.7 83.5 83.3  PLT 236 254 183   Cardiac Enzymes:  Recent Labs Lab 04/28/15 1428 04/28/15 1927 04/29/15 0137  TROPONINI 0.03 0.04* 0.04*   CBG: No results for input(s): GLUCAP in the last 168 hours.  Iron Studies: No results for input(s): IRON, TIBC, TRANSFERRIN, FERRITIN in the last 72 hours. Studies/Results: No results found. Medications: Infusions: . heparin 1,600 Units/hr (04/29/15 2354)    Scheduled Medications: . Chlorhexidine Gluconate Cloth  6 each Topical Q0600  . doxercalciferol  2 mcg Intravenous Q T,Th,Sa-HD  . mupirocin ointment  1 application  Nasal BID    have reviewed scheduled and prn medications.  Physical Exam: General: falls asleep easily on HD Heart: RRR Lungs: dec BS bilat Abdomen: soft, non tender Extremities: some pitting edema Dialysis Access: right sided PC with drainage- but also left upper arm AVF    04/30/2015,11:51 AM  LOS: 2 days

## 2015-04-30 NOTE — Progress Notes (Signed)
ANTICOAGULATION CONSULT NOTE - Follow Up Consult  Pharmacy Consult for Heparin  Indication: Superior vena cava syndrome   Allergies  Allergen Reactions  . Penicillins Other (See Comments)    unsure    Patient Measurements: Height: 5\' 7"  (170.2 cm) Weight: 176 lb 2.4 oz (79.9 kg) (bed scale) IBW/kg (Calculated) : 66.1  Vital Signs: Temp: 99.6 F (37.6 C) (08/07 0500) Temp Source: Axillary (08/07 0500) BP: 171/107 mmHg (08/07 0500) Pulse Rate: 101 (08/07 0500)  Labs:  Recent Labs  04/28/15 0922 04/28/15 1914 04/28/15 1428 04/28/15 1927 04/29/15 0137 04/29/15 0420 04/29/15 0747 04/29/15 2153 04/30/15 0707 04/30/15 0758  HGB 14.3 11.3*  --   --   --  11.3*  --   --   --   --   HCT 42.0 35.4*  --   --   --  34.8*  --   --   --   --   PLT  --  236  --   --   --  254  --   --   --   --   HEPARINUNFRC  --   --   --   --   --   --  0.12* 0.14*  --  0.14*  CREATININE 14.20* 14.59*  --   --   --  14.65*  --   --  9.17*  --   TROPONINI  --   --  0.03 0.04* 0.04*  --   --   --   --   --     Estimated Creatinine Clearance: 8.4 mL/min (by C-G formula based on Cr of 9.17).   Assessment: Heparin level remains sub-therapeutic at 0.14 after rate increase  Goal of Therapy:  Heparin level 0.3-0.7 units/ml Monitor platelets by anticoagulation protocol: Yes   Plan:  -Increase heparin to 1800 units/hr (smaller increase with possibility of some infusion issues previously) -1800 HL -Daily CBC/HL -Monitor for bleeding  Tad Moore 04/30/2015,9:47 AM

## 2015-04-30 NOTE — Evaluation (Signed)
Physical Therapy Evaluation Patient Details Name: Noah Lewis MRN: [id number] DOB: [birth year] Today's Date: 04/30/2015   History of Present Illness  64 Y O M with PMH of ESRD, HTN, Bipolar disorder, cardiac disease- admitted with shortness of breath  and fluid overload after missing HD   Clinical Impression  Pt admitted with above diagnosis. Pt currently with functional limitations due to the deficits listed below (see PT Problem List).  Pt will benefit from skilled PT to increase their independence and safety with mobility to allow discharge to the venue listed below.       Follow Up Recommendations Outpatient PT (Outpatient PT is not a bad idea, to address fall risk, trunk and gait stiffness; The potential need for Outpatient PT can be addressed at PCP follow-up appointments. )    Equipment Recommendations  Cane;Other (comment) (Pt will likely decline cane)    Recommendations for Other Services OT consult     Precautions / Restrictions Precautions Precautions: Other (comment) Precaution Comments: Sitter in room; ?Suicide Precautions?      Mobility  Bed Mobility Overal bed mobility: Modified Independent                Transfers Overall transfer level: Needs assistance Equipment used: None Transfers: Sit to/from Stand Sit to Stand: Supervision         General transfer comment: Cues to self-monitor for activity tolerance  Ambulation/Gait Ambulation/Gait assistance: Min guard Ambulation Distance (Feet): 140 Feet Assistive device: None (and pushing IV pole) Gait Pattern/deviations: Step-through pattern;Wide base of support   Gait velocity interpretation: Below normal speed for age/gender General Gait Details: Inefficient gait pattern and decr gait velocity, but no gross losses of balance noted  Stairs            Wheelchair Mobility    Modified Rankin (Stroke Patients Only)       Balance Overall balance assessment: Needs assistance             Standing balance comment: Noted occasionally reaches out for UE support in room, though not often; some trunk and gait stiffness that would effect his ability to maintain blance against perturbations                             Pertinent Vitals/Pain Pain Assessment: Faces Faces Pain Scale: Hurts little more Pain Location: LLE inner thigh pain with amb; headed back to room Pain Descriptors / Indicators: Aching Pain Intervention(s): Limited activity within patient's tolerance    Home Living Family/patient expects to be discharged to:: Unsure                 Additional Comments: Pt may be experiencing homelessness, which complicates things    Prior Function Level of Independence: Independent               Hand Dominance        Extremity/Trunk Assessment   Upper Extremity Assessment: Generalized weakness           Lower Extremity Assessment: Overall WFL for tasks assessed         Communication   Communication: Expressive difficulties;Other (comment) (Speech unintelligible at times)  Cognition Arousal/Alertness: Awake/alert Behavior During Therapy: WFL for tasks assessed/performed Overall Cognitive Status: Within Functional Limits for tasks assessed (for simple mobility tasks)                      General Comments General comments (skin integrity, edema, etc.): Bulk  of session conducted on [employer]; Audible snore-like respirations; O2 sat remained greater than or equal to 94%; Pt reports he is more comfortable with supplemental O2 (nasal cannula) in    Exercises        Assessment/Plan    PT Assessment Patient needs continued PT services  PT Diagnosis Difficulty walking   PT Problem List Decreased activity tolerance;Decreased balance;Decreased mobility;Decreased coordination;Decreased knowledge of use of DME;Decreased knowledge of precautions  PT Treatment Interventions DME instruction;Gait training;Stair training;Functional  mobility training;Therapeutic activities;Therapeutic exercise;Patient/family education   PT Goals (Current goals can be found in the Care Plan section) Acute Rehab PT Goals Patient Stated Goal: did not state PT Goal Formulation: With patient Time For Goal Achievement: 05/14/15 Potential to Achieve Goals: Good    Frequency Min 3X/week   Barriers to discharge   Experiencing homelessness    Co-evaluation               End of Session Equipment Utilized During Treatment: Gait belt Activity Tolerance: Patient tolerated treatment well Patient left: in bed;with call bell/phone within reach;with nursing/sitter in room Nurse Communication: Mobility status         Time: 7116-5790 PT Time Calculation (min) (ACUTE ONLY): 17 min   Charges:   PT Evaluation $Initial PT Evaluation Tier I: 1 Procedure     PT G CodesQuin Hoop 04/30/2015, 5:47 PM  Roney Marion, PT  Acute Rehabilitation Services Pager [phone] Office [phone]

## 2015-05-01 ENCOUNTER — Inpatient Hospital Stay (HOSPITAL_COMMUNITY): Payer: Medicaid Other

## 2015-05-01 DIAGNOSIS — E1122 Type 2 diabetes mellitus with diabetic chronic kidney disease: Secondary | ICD-10-CM

## 2015-05-01 DIAGNOSIS — F319 Bipolar disorder, unspecified: Secondary | ICD-10-CM

## 2015-05-01 DIAGNOSIS — M545 Low back pain: Secondary | ICD-10-CM

## 2015-05-01 DIAGNOSIS — Z86718 Personal history of other venous thrombosis and embolism: Secondary | ICD-10-CM

## 2015-05-01 DIAGNOSIS — R220 Localized swelling, mass and lump, head: Secondary | ICD-10-CM

## 2015-05-01 DIAGNOSIS — G8929 Other chronic pain: Secondary | ICD-10-CM

## 2015-05-01 DIAGNOSIS — E8779 Other fluid overload: Secondary | ICD-10-CM

## 2015-05-01 LAB — CBC
HEMATOCRIT: 36 % — AB (ref 39.0–52.0)
Hemoglobin: 11.5 g/dL — ABNORMAL LOW (ref 13.0–17.0)
MCH: 26.6 pg (ref 26.0–34.0)
MCHC: 31.9 g/dL (ref 30.0–36.0)
MCV: 83.3 fL (ref 78.0–100.0)
PLATELETS: 218 10*3/uL (ref 150–400)
RBC: 4.32 MIL/uL (ref 4.22–5.81)
RDW: 18.1 % — AB (ref 11.5–15.5)
WBC: 6.2 10*3/uL (ref 4.0–10.5)

## 2015-05-01 LAB — RENAL FUNCTION PANEL
ANION GAP: 17 — AB (ref 5–15)
Albumin: 3.9 g/dL (ref 3.5–5.0)
BUN: 58 mg/dL — AB (ref 6–20)
CHLORIDE: 97 mmol/L — AB (ref 101–111)
CO2: 21 mmol/L — ABNORMAL LOW (ref 22–32)
Calcium: 8.2 mg/dL — ABNORMAL LOW (ref 8.9–10.3)
Creatinine, Ser: 10.27 mg/dL — ABNORMAL HIGH (ref 0.61–1.24)
GFR, EST AFRICAN AMERICAN: 5 mL/min — AB (ref >60)
GFR, EST NON AFRICAN AMERICAN: 5 mL/min — AB (ref >60)
Glucose, Bld: 77 mg/dL (ref 65–99)
PHOSPHORUS: 7 mg/dL — AB (ref 2.5–4.6)
POTASSIUM: 5.2 mmol/L — AB (ref 3.5–5.1)
Sodium: 135 mmol/L (ref 135–145)

## 2015-05-01 MED ORDER — LIDOCAINE HCL 1 % IJ SOLN
INTRAMUSCULAR | Status: AC
  Filled 2015-05-01: qty 20

## 2015-05-01 MED ORDER — CHLORHEXIDINE GLUCONATE 4 % EX LIQD
CUTANEOUS | Status: AC
  Filled 2015-05-01: qty 15

## 2015-05-01 NOTE — Procedures (Signed)
RIJV HD catheter removal No comp/EBL

## 2015-05-01 NOTE — Progress Notes (Signed)
  [facility] Progress Note   Subjective: no complaints  Filed Vitals:   04/30/15 0950 04/30/15 2004 04/30/15 2027 05/01/15 0458  BP: 130/92  148/110 154/107  Pulse: 78  89 90  Temp: 98.5 F (36.9 C)  98.6 F (37 C) 98.9 F (37.2 C)  TempSrc: Oral  Oral Oral  Resp: 16  19 22   Height:      Weight:  81.7 kg (180 lb 1.9 oz)    SpO2: 100%  97% 100%   Exam: General: falls asleep easily, but Ox 3 Heart: RRR Lungs: dec BS bilat Abdomen: soft, non tender Extremities: some mild pitting edema Access: left upper arm AVF patent, IJ cath is now removed Neuro is alert, nf, Ox 3  [place] [facility] [place]   80kg   3K/3.0 Ca bath   AVF / IJ cath (now removed)  Heparin none Hectorol 2 ug tiw, no esa      Assessment: 1. Vol overload / uremia - due to missed dialysis. Had dialysis Sat, plan HD again today and then resume TTS tomorrow 2. ESRD HD TTS [place] - to use AVF from here forward 3. 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 Removing IJ cath today to see if this helps 4. HTN was taking labetalol at home 400 mg tid, will resume at bid 5. Anemia Hb ok, no esa 6. MBD on vit D w HD 7. Social - possibly homeless 8. Psych - has issues of SI, has sitter, psych following  Plan - HD today and tomorrow    Kelly Splinter MD  pager [phone]    cell [phone]  05/01/2015, 11:04 AM     Recent Labs Lab 04/29/15 0420 04/30/15 0707 05/01/15 0635  NA 138 136 135  K 5.2* 4.9 5.2*  CL 105 98* 97*  CO2 14* 24 21*  GLUCOSE 71 74 77  BUN 82* 46* 58*  CREATININE 14.65* 9.17* 10.27*  CALCIUM 8.2* 8.2* 8.2*  PHOS  --   --  7.0*    Recent Labs Lab 04/28/15 0924 05/01/15 0635  AST 64*  --   ALT 61  --   ALKPHOS 89  --   BILITOT 1.1  --   PROT 7.5  --   ALBUMIN 3.7 3.9    Recent Labs Lab 04/28/15 0924 04/29/15 0420 04/30/15 0707  WBC 8.6 9.5 5.3  NEUTROABS 7.1  --   --   HGB 11.3* 11.3* 11.1*  HCT 35.4* 34.8* 33.9*  MCV 82.7 83.5 83.3  PLT 236 254 183    . chlorhexidine      . Chlorhexidine Gluconate Cloth  6 each Topical Q0600  . doxercalciferol  2 mcg Intravenous Q T,Th,Sa-HD  . heparin subcutaneous  5,000 Units Subcutaneous 3 times per day  . lidocaine      . mupirocin ointment  1 application Nasal BID     acetaminophen, capsaicin, ipratropium-albuterol
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
## 2015-05-02 DIAGNOSIS — F3130 Bipolar disorder, current episode depressed, mild or moderate severity, unspecified: Secondary | ICD-10-CM

## 2015-05-02 DIAGNOSIS — F29 Unspecified psychosis not due to a substance or known physiological condition: Secondary | ICD-10-CM

## 2015-05-02 DIAGNOSIS — F419 Anxiety disorder, unspecified: Secondary | ICD-10-CM

## 2015-05-02 LAB — BASIC METABOLIC PANEL
ANION GAP: 13 (ref 5–15)
BUN: 39 mg/dL — ABNORMAL HIGH (ref 6–20)
CO2: 25 mmol/L (ref 22–32)
Calcium: 8.3 mg/dL — ABNORMAL LOW (ref 8.9–10.3)
Chloride: 98 mmol/L — ABNORMAL LOW (ref 101–111)
Creatinine, Ser: 8.04 mg/dL — ABNORMAL HIGH (ref 0.61–1.24)
GFR calc non Af Amer: 6 mL/min — ABNORMAL LOW (ref >60)
GFR, EST AFRICAN AMERICAN: 7 mL/min — AB (ref >60)
Glucose, Bld: 98 mg/dL (ref 65–99)
Potassium: 3.9 mmol/L (ref 3.5–5.1)
Sodium: 136 mmol/L (ref 135–145)

## 2015-05-02 LAB — CBC
HCT: 33.9 % — ABNORMAL LOW (ref 39.0–52.0)
HEMOGLOBIN: 10.8 g/dL — AB (ref 13.0–17.0)
MCH: 26.7 pg (ref 26.0–34.0)
MCHC: 31.9 g/dL (ref 30.0–36.0)
MCV: 83.7 fL (ref 78.0–100.0)
PLATELETS: 139 10*3/uL — AB (ref 150–400)
RBC: 4.05 MIL/uL — ABNORMAL LOW (ref 4.22–5.81)
RDW: 18.2 % — ABNORMAL HIGH (ref 11.5–15.5)
WBC: 6.3 10*3/uL (ref 4.0–10.5)

## 2015-05-02 LAB — HEPATITIS B SURFACE ANTIBODY,QUALITATIVE: HEP B S AB: REACTIVE

## 2015-05-02 LAB — HEPATITIS B CORE ANTIBODY, TOTAL: HEP B C TOTAL AB: POSITIVE — AB

## 2015-05-02 LAB — HEPATITIS B SURFACE ANTIGEN: HEP B S AG: NEGATIVE

## 2015-05-02 MED ORDER — LABETALOL HCL 200 MG PO TABS
400.0000 mg | ORAL_TABLET | ORAL | Status: DC
  Administered 2015-05-02: 400 mg via ORAL
  Filled 2015-05-02: qty 2

## 2015-05-02 MED ORDER — LABETALOL HCL 200 MG PO TABS: 400.0000 mg | ORAL_TABLET | Freq: Two times a day (BID) | ORAL | Status: DC

## 2015-05-02 MED ORDER — LABETALOL HCL 200 MG PO TABS: 400.0000 mg | ORAL_TABLET | ORAL | Status: DC

## 2015-05-02 NOTE — Progress Notes (Signed)
Subjective:   No complaints- per sitter has been sleeping all day.   Objective Filed Vitals:   05/01/15 1800 05/01/15 2026 05/02/15 0613 05/02/15 0950  BP: 155/104 154/107 143/118 149/105  Pulse: 97 103 86 91  Temp: 98.4 F (36.9 C) 98.4 F (36.9 C) 97.6 F (36.4 C) 97.9 F (36.6 C)  TempSrc: Oral Oral Oral Axillary  Resp: 20 18 20 18   Height:      Weight:   79.7 kg (175 lb 11.3 oz)   SpO2: 100% 100% 99% 95%   Physical Exam General: sitting up on side of bed, hard of hearing. No acute distress. Some facial edema Heart: RRR  Lungs: Dim bases. Unlabored  Abdomen: soft, nontender +BS  Extremities: +1 LE edema  Dialysis Access:  L AVF +b/t   [facility] 80kg 3K/3.0 Ca bath AVF / IJ cath (now removed) Heparin none Hectorol 2 ug tiw, no esa    Assessment: 1. Vol overload / uremia - due to missed dialysis. HD tomorrow- need to get back on TTS [place]. Needs lower edw 2. ESRD HD TTS [place] - to use AVF from here forward 3. 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  4. HTN was taking labetalol at home 400 mg tid, will resume at bid BP's still high , resumed labetalol and needs more volume off which might help his facial / UE edema some 5. 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 Social - possibly homeless 8. Psych - has issues of SI, has sitter, psych following- does not meet criteria for inpt admission  Shelle Iron, NP [place] [phone] 05/02/2015,5:00 PM  LOS: 4 days   Pt seen, examined, agree w assess/plan as above with additions as indicated.  Kelly Splinter MD pager [phone]    cell [phone] 05/02/2015, 5:27 PM     Additional Objective Labs: Basic Metabolic Panel:  Recent Labs Lab 04/30/15 0707 05/01/15 0635 05/02/15 0647  NA 136 135 136  K 4.9 5.2* 3.9  CL 98* 97* 98*  CO2 24 21* 25  GLUCOSE 74 77 98  BUN 46* 58* 39*  CREATININE 9.17* 10.27* 8.04*  CALCIUM 8.2* 8.2* 8.3*  PHOS   --  7.0*  --    Liver Function Tests:  Recent Labs Lab 04/28/15 0924 05/01/15 0635  AST 64*  --   ALT 61  --   ALKPHOS 89  --   BILITOT 1.1  --   PROT 7.5  --   ALBUMIN 3.7 3.9   No results for input(s): LIPASE, AMYLASE in the last 168 hours. CBC:  Recent Labs Lab 04/28/15 0924 04/29/15 0420 04/30/15 0707 05/01/15 1236 05/02/15 0647  WBC 8.6 9.5 5.3 6.2 6.3  NEUTROABS 7.1  --   --   --   --   HGB 11.3* 11.3* 11.1* 11.5* 10.8*  HCT 35.4* 34.8* 33.9* 36.0* 33.9*  MCV 82.7 83.5 83.3 83.3 83.7  PLT 236 254 183 218 139*   Blood Culture    Component Value Date/Time   SDES URINE, CLEAN CATCH 10/26/2007 1400   SPECREQUEST NONE 10/26/2007 1400   CULT NO GROWTH 10/26/2007 1400   REPTSTATUS 10/28/2007 FINAL 10/26/2007 1400    Cardiac Enzymes:  Recent Labs Lab 04/28/15 1428 04/28/15 1927 04/29/15 0137  TROPONINI 0.03 0.04* 0.04*   CBG: No results for input(s): GLUCAP in the last 168 hours. [place] Studies: No results for input(s): IRON, TIBC, TRANSFERRIN, FERRITIN  in the last 72 hours. @lablastinr3 @ Studies/Results: Ir Removal Tun Cv Cath W/o Fl  05/01/2015   CLINICAL DATA:  Functioning fistula  EXAM: TUNNEL CATHETER REMOVAL  PROCEDURE: The procedure, risks, benefits, and alternatives were explained to the patient. Questions regarding the procedure were encouraged and answered. The patient understands and consents to the procedure.  The right chest was prepped with Betadine in a sterile fashion, and a sterile drape was applied covering the operative field. A sterile gown and sterile gloves were used for the procedure.  1% lidocaine was utilized for local anesthesia. Utilizing blunt dissection, the cuff of the catheter was freed from the underlying subcutaneous tissue. The catheter was removed in its entirety. Hemostasis was achieved with direct pressure.  COMPLICATIONS: None  IMPRESSION: Successful tunneled dialysis catheter removal.   Electronically Signed   By: Marybelle Killings  M.D.   On: 05/01/2015 13:18   Medications:   . Chlorhexidine Gluconate Cloth  6 each Topical Q0600  . doxercalciferol  2 mcg Intravenous Q T,Th,Sa-HD  . heparin subcutaneous  5,000 Units Subcutaneous 3 times per day  . mupirocin ointment  1 application Nasal BID
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
## 2015-05-02 NOTE — Clinical Social Work Note (Signed)
CSW talked with Mr. Strupp on 8/8 regarding his current living situation and  patient reports that he has no where to stay and cannot return to where he was living in [place]. Per patient, he is trying to get his dialysis center changed from [place] to [facility]. CSW checked with secretary in dialysis center on 8/8 and she has received no orders to change patient's dialysis center. Contact made with [facility] on 8/8 and clinicals sent to facility for review. They cannot accept patient as they cannot provide a renal diet.  CSW received call from patient mother on 8/9 and she expressed concern regarding patient having no where to stay and feels he needs to be in a nursing home as he has a problem with falling.  Patient currently working on LTC SNF placment for patient.   Vergil Burby Givens, MSW, LCSW Licensed Clinical Social Worker [place] [phone]

## 2015-05-02 NOTE — Clinical Social Work Placement (Addendum)
   CLINICAL SOCIAL WORK PLACEMENT  NOTE  Date:  05/02/2015  Patient Details  Name: Noah Lewis MRN: [id number] Date of Birth: 06/13/1951  Clinical Social Work is seeking post-discharge placement for this patient at the [place] level of care (*CSW will initial, date and re-position this form in  chart as items are completed):  No   Patient/family provided with [place] Work Department's list of facilities offering this level of care within the geographic area requested by the patient (or if unable, by the patient's family).  Yes   Patient/family informed of their freedom to choose among providers that offer the needed level of care, that participate in Medicare, Medicaid or managed care program needed by the patient, have an available bed and are willing to accept the patient.  Yes   Patient/family informed of [facility]'s ownership interest in [facility] and [facility], as well as of the fact that they are under no obligation to receive care at these facilities.  PASRR submitted to EDS on 05/02/15     PASRR number received on 05/02/15 - CSW resubmitted for PASRR due to Bipolar disorder diagnosis and 2012 PASRR received.    Existing PASRR number confirmed on       FL2 transmitted to all facilities in geographic area requested by pt/family on 05/02/15     FL2 transmitted to all facilities within larger geographic area on       Patient informed that his/her managed care company has contracts with or will negotiate with certain facilities, including the following:            Patient/family informed of bed offers received.  Patient chooses bed at       Physician recommends and patient chooses bed at      Patient to be transferred to   on  .  Patient to be transferred to facility by       Patient family notified on   of transfer.  Name of family member notified:        PHYSICIAN       Additional Comment:     _______________________________________________ Sable Feil, LCSW 05/02/2015, 7:17 PM
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
## 2015-05-03 MED ORDER — MUPIROCIN 2 % EX OINT: 1.0000 "application " | TOPICAL_OINTMENT | Freq: Two times a day (BID) | CUTANEOUS | Status: DC

## 2015-05-03 MED ORDER — CAPSAICIN 0.025 % EX CREA: TOPICAL_CREAM | Freq: Two times a day (BID) | CUTANEOUS | Status: DC | PRN

## 2015-05-03 MED ORDER — ACETAMINOPHEN 325 MG PO TABS: 325.0000 mg | ORAL_TABLET | Freq: Four times a day (QID) | ORAL | Status: DC | PRN

## 2015-05-03 MED ORDER — IPRATROPIUM-ALBUTEROL 0.5-2.5 (3) MG/3ML IN SOLN: 3.0000 mL | Freq: Four times a day (QID) | RESPIRATORY_TRACT | Status: DC | PRN

## 2015-05-03 NOTE — Discharge Instructions (Signed)
1. Noah Lewis, it is very, very important that you make it to your dialysis sessions in [facility]. You confirmed with [place] today that you could arrive there by bus. To make it easy, if you want to stay in [place], consider having your dialysis sessions moved to [place].  2. If you feel like harming yourself or others, seek help immediately.  3. If your face or arms start swelling worse than now, seek medical attention.
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
## 2015-05-03 NOTE — Progress Notes (Signed)
Pt with an order to D/C to SNF when bed available. Told this Probation officer he would neither return to [facility] nor seeking placement in [place]. CSW on the unit made aware of decision. Also refused hemodialysis saying "will take bus in the morning to dialysis" Informed Dr. Marijean Bravo in to assess his intention to discharge home to sate with his aunt. His aunt called this Nurse after pt placing call to her for ride home. Aunt stated she unable to care for him because "too sick, and will refuse to take medications" Pt made aware of the call. Frustrated, requesteing to leave AMA. Forn given and signed to the chart. Dr. Marijean Bravo paged twice to notify. No return call received. Pt left the unit at 1438 via wheelchair assisted by volunteer.

## 2015-05-03 NOTE — Progress Notes (Addendum)
05/03/2015 12:28 PM Spoke with patient at the bedside who acted like he could not hear or see/understand what LCSW was discussing with him. Obtained PT [facility] and front desk staff to clarify if patient had a cognition delay or comprehension problem to which both deny. Patient only reports he does not like his lunch and wants something else. Patient reports he just left [place], [place] and is not going back.  LCSW attempted several times to explain SNF and Nursing Care and patient is not agreeable at this time.  Patient reports "I would rather sleep on the street". Patient also made aware that Medicaid check would be taken for payment for SNF and he is not agreeable for this as well.  Patient has multiple family members calling unit and appears to have support, but does not engage with family when they call.  Disposition pending at this time. Patient has no skilled need for SNF at this time either per physical therapy. DC HOME.   LCSW following patient for disposition and placement. Call placed to only considering facility:  [facility] for review of clinicals and possible bed offer.  Patient already received dialysis in [place] and SNF is in [place] and transfers to diaylsis center. Patient must give up his check for placement if he is interested.  Awaiting review from facility and if he is accepted.  Will follow up. Patient is medically stable for DC per MD.  FL2 on chart, please sign!  Lane Hacker, MSW Clinical Social Work: Emergency Room [phone]
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
## 2015-05-03 NOTE — Progress Notes (Signed)
Subjective:   Sleeping- no complaints  Objective Filed Vitals:   05/02/15 0613 05/02/15 0950 05/02/15 2044 05/03/15 0446  BP: 143/118 149/105 154/106 109/73  Pulse: 86 91 107 74  Temp: 97.6 F (36.4 C) 97.9 F (36.6 C) 98.1 F (36.7 C) 98.2 F (36.8 C)  TempSrc: Oral Axillary Axillary Oral  Resp: 20 18 18 20   Height:      Weight: 79.7 kg (175 lb 11.3 oz)     SpO2: 99% 95% 100% 99%   Physical Exam General: resting in bed, no acute distress. Heart: RRR Lungs: CTA, dim bases, unlabored Abdomen: soft, nontender +BS  Extremities: +1LE edema  Dialysis Access:  L AVF +b/t    [facility] 80kg 3K/3.0 Ca bath AVF / IJ cath (now removed) Heparin none Hectorol 2 ug tiw, no esa    Assessment: 1. Vol overload / uremia - due to missed dialysis. Resolved.  HD tomorrow- need to get back on TTS [place]. Needs lower edw 2. ESRD HD TTS [place] - HD pending today- to use AVF from here forward 3. 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  4. HTN was taking labetalol at home 400 mg tid, will resume at bid - cont volume removal 5. 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 Social - possibly homeless 8. Psych - has issues of SI, psych following- does not meet criteria for inpt admission 9. Dispo- SNF placement at [place], [place] [phone] 05/03/2015,10:26 AM  LOS: 5 days   Pt seen, examined and agree w A/P as above. For dc today.  Kelly Splinter MD pager [phone]    cell [phone] 05/03/2015, 2:51 PM    Additional Objective Labs: Basic Metabolic Panel:  Recent Labs Lab 04/30/15 0707 05/01/15 0635 05/02/15 0647  NA 136 135 136  K 4.9 5.2* 3.9  CL 98* 97* 98*  CO2 24 21* 25  GLUCOSE 74 77 98  BUN 46* 58* 39*  CREATININE 9.17* 10.27* 8.04*  CALCIUM 8.2* 8.2* 8.3*  PHOS  --  7.0*  --    Liver Function Tests:  Recent Labs Lab 04/28/15 0924 05/01/15 0635  AST 64*  --   ALT 61  --    ALKPHOS 89  --   BILITOT 1.1  --   PROT 7.5  --   ALBUMIN 3.7 3.9   No results for input(s): LIPASE, AMYLASE in the last 168 hours. CBC:  Recent Labs Lab 04/28/15 0924 04/29/15 0420 04/30/15 0707 05/01/15 1236 05/02/15 0647  WBC 8.6 9.5 5.3 6.2 6.3  NEUTROABS 7.1  --   --   --   --   HGB 11.3* 11.3* 11.1* 11.5* 10.8*  HCT 35.4* 34.8* 33.9* 36.0* 33.9*  MCV 82.7 83.5 83.3 83.3 83.7  PLT 236 254 183 218 139*   Blood Culture    Component Value Date/Time   SDES URINE, CLEAN CATCH 10/26/2007 1400   SPECREQUEST NONE 10/26/2007 1400   CULT NO GROWTH 10/26/2007 1400   REPTSTATUS 10/28/2007 FINAL 10/26/2007 1400    Cardiac Enzymes:  Recent Labs Lab 04/28/15 1428 04/28/15 1927 04/29/15 0137  TROPONINI 0.03 0.04* 0.04*   CBG: No results for input(s): GLUCAP in the last 168 hours. Iron Studies: No results for input(s): IRON, TIBC, TRANSFERRIN, FERRITIN in the last 72 hours. @lablastinr3 @ Studies/Results: No results found. Medications:   . Chlorhexidine Gluconate Cloth  6 each Topical Q0600  .  doxercalciferol  2 mcg Intravenous Q T,Th,Sa-HD  . heparin subcutaneous  5,000 Units Subcutaneous 3 times per day  . labetalol  400 mg Oral 2 times per day on Sun Mon Wed Fri   And  . labetalol  400 mg Oral Once per day on Tue Thu Sat  . mupirocin ointment  1 application Nasal BID

## 2015-05-03 NOTE — Progress Notes (Signed)
Physical Therapy Treatment Patient Details Name: Noah Lewis MRN: [id number] DOB: 1951-06-26 Today's Date: 05/03/2015    History of Present Illness 64 Y O M with PMH of ESRD, HTN, Bipolar disorder, cardiac disease- admitted with shortness of breath  and fluid overload after missing HD     PT Comments    Reluctant to get up and walk; needing persistent encouragement to participate; Pt declines any assistive device to try with amb, despite given rationale that using a cane would give him a wider base of support and decrease his risk of falls, and/or using a RW could potentially give him bil UE support and he could take some pressure off of his back   Follow Up Recommendations  Outpatient PT (can address back pain, trunk stiffness, balance deficits)     Equipment Recommendations  Cane;Other (comment) (Pt will likely decline cane)    Recommendations for Other Services OT consult     Precautions / Restrictions Precautions Precautions: Fall Precaution Comments: Noted history of falls; pt declines use of any assistive device; he is in a camera room    Mobility  Bed Mobility Overal bed mobility: Needs Assistance Bed Mobility: Supine to Sit     Supine to sit: Mod assist     General bed mobility comments: Light mod handheld assist to pull to sit  Transfers Overall transfer level: Modified independent   Transfers: Sit to/from Stand Sit to Stand: Modified independent (Device/Increase time)         General transfer comment: no difficulty  Ambulation/Gait Ambulation/Gait assistance: Supervision Ambulation Distance (Feet): 120 Feet Assistive device: None Gait Pattern/deviations: Step-through pattern   Gait velocity interpretation: Below normal speed for age/gender General Gait Details: Inefficient gait pattern and decr gait velocity, but no gross losses of balance noted; Stiff trunk; Back pain limited amb distance   Stairs            Wheelchair Mobility     Modified Rankin (Stroke Patients Only)       Balance                                    Cognition Arousal/Alertness: Lethargic (was in bed, presumably asleep upon entry), but he woke up Behavior During Therapy:  (at times would not answer questions, needed repeat asking) Overall Cognitive Status: Within Functional Limits for tasks assessed (for simple mobility tasks)                      Exercises      General Comments General comments (skin integrity, edema, etc.): Session conducted of room air; continued snore-like respirations; O2 sats 98-100% at end of walk      Pertinent Vitals/Pain Pain Assessment: 0-10 Pain Score: 9  Pain Location: Back pain Pain Descriptors / Indicators: Aching Pain Intervention(s): Limited activity within patient's tolerance;Patient requesting pain meds-RN notified    Home Living                      Prior Function            PT Goals (current goals can now be found in the care plan section) Acute Rehab PT Goals Patient Stated Goal: did not state PT Goal Formulation: With patient Time For Goal Achievement: 05/14/15 Potential to Achieve Goals: Good Progress towards PT goals: Progressing toward goals (less assist needed, but pain limiting amb distance)    Frequency  Min 3X/week    PT Plan Current plan remains appropriate    Co-evaluation             End of Session   Activity Tolerance: Patient tolerated treatment well Patient left: in bed;with bed alarm set (sitting EOB)     Time: 9563-8756 PT Time Calculation (min) (ACUTE ONLY): 22 min  Charges:  $Gait Training: 8-22 mins                    G Codes:      Noah Lewis 05/03/2015, 1:47 PM  Noah Lewis, [place]  Acute Rehabilitation Services Pager [phone] Office [phone]

## 2015-05-08 ENCOUNTER — Encounter (HOSPITAL_COMMUNITY): Payer: Self-pay

## 2015-05-08 ENCOUNTER — Emergency Department (HOSPITAL_COMMUNITY): Payer: Medicaid Other

## 2015-05-08 ENCOUNTER — Emergency Department (HOSPITAL_COMMUNITY)
Admission: EM | Admit: 2015-05-08 | Discharge: 2015-05-08 | Disposition: A | Discharge status: Home / Self Care | Payer: Medicaid Other | Attending: Emergency Medicine | Admitting: Emergency Medicine

## 2015-05-08 DIAGNOSIS — I2510 Atherosclerotic heart disease of native coronary artery without angina pectoris: Secondary | ICD-10-CM | POA: Insufficient documentation

## 2015-05-08 DIAGNOSIS — S3992XA Unspecified injury of lower back, initial encounter: Secondary | ICD-10-CM | POA: Diagnosis not present

## 2015-05-08 DIAGNOSIS — J449 Chronic obstructive pulmonary disease, unspecified: Secondary | ICD-10-CM | POA: Diagnosis not present

## 2015-05-08 DIAGNOSIS — W1839XA Other fall on same level, initial encounter: Secondary | ICD-10-CM | POA: Diagnosis not present

## 2015-05-08 DIAGNOSIS — Z859 Personal history of malignant neoplasm, unspecified: Secondary | ICD-10-CM | POA: Diagnosis not present

## 2015-05-08 DIAGNOSIS — Z880 Allergy status to penicillin: Secondary | ICD-10-CM | POA: Insufficient documentation

## 2015-05-08 DIAGNOSIS — Z79899 Other long term (current) drug therapy: Secondary | ICD-10-CM | POA: Insufficient documentation

## 2015-05-08 DIAGNOSIS — E119 Type 2 diabetes mellitus without complications: Secondary | ICD-10-CM | POA: Diagnosis not present

## 2015-05-08 DIAGNOSIS — I10 Essential (primary) hypertension: Secondary | ICD-10-CM | POA: Insufficient documentation

## 2015-05-08 DIAGNOSIS — Y9301 Activity, walking, marching and hiking: Secondary | ICD-10-CM | POA: Insufficient documentation

## 2015-05-08 DIAGNOSIS — M1990 Unspecified osteoarthritis, unspecified site: Secondary | ICD-10-CM | POA: Insufficient documentation

## 2015-05-08 DIAGNOSIS — Z7982 Long term (current) use of aspirin: Secondary | ICD-10-CM | POA: Diagnosis not present

## 2015-05-08 DIAGNOSIS — W19XXXA Unspecified fall, initial encounter: Secondary | ICD-10-CM

## 2015-05-08 DIAGNOSIS — Z87448 Personal history of other diseases of urinary system: Secondary | ICD-10-CM | POA: Diagnosis not present

## 2015-05-08 DIAGNOSIS — M549 Dorsalgia, unspecified: Secondary | ICD-10-CM

## 2015-05-08 DIAGNOSIS — Y998 Other external cause status: Secondary | ICD-10-CM | POA: Diagnosis not present

## 2015-05-08 DIAGNOSIS — Y9289 Other specified places as the place of occurrence of the external cause: Secondary | ICD-10-CM | POA: Insufficient documentation

## 2015-05-08 LAB — BASIC METABOLIC PANEL
Anion gap: 18 — ABNORMAL HIGH (ref 5–15)
BUN: 89 mg/dL — AB (ref 6–20)
CHLORIDE: 101 mmol/L (ref 101–111)
CO2: 18 mmol/L — AB (ref 22–32)
CREATININE: 12.59 mg/dL — AB (ref 0.61–1.24)
Calcium: 8.8 mg/dL — ABNORMAL LOW (ref 8.9–10.3)
GFR calc Af Amer: 4 mL/min — ABNORMAL LOW (ref >60)
GFR calc non Af Amer: 4 mL/min — ABNORMAL LOW (ref >60)
Glucose, Bld: 76 mg/dL (ref 65–99)
Potassium: 4.5 mmol/L (ref 3.5–5.1)
Sodium: 137 mmol/L (ref 135–145)

## 2015-05-08 LAB — CBC WITH DIFFERENTIAL/PLATELET
Basophils Absolute: 0 10*3/uL (ref 0.0–0.1)
Basophils Relative: 0 % (ref 0–1)
EOS ABS: 0.3 10*3/uL (ref 0.0–0.7)
Eosinophils Relative: 5 % (ref 0–5)
HEMATOCRIT: 32.9 % — AB (ref 39.0–52.0)
Hemoglobin: 10.8 g/dL — ABNORMAL LOW (ref 13.0–17.0)
Lymphocytes Relative: 10 % — ABNORMAL LOW (ref 12–46)
Lymphs Abs: 0.7 10*3/uL (ref 0.7–4.0)
MCH: 27.4 pg (ref 26.0–34.0)
MCHC: 32.8 g/dL (ref 30.0–36.0)
MCV: 83.5 fL (ref 78.0–100.0)
MONO ABS: 0.8 10*3/uL (ref 0.1–1.0)
MONOS PCT: 12 % (ref 3–12)
Neutro Abs: 4.8 10*3/uL (ref 1.7–7.7)
Neutrophils Relative %: 73 % (ref 43–77)
Platelets: 205 10*3/uL (ref 150–400)
RBC: 3.94 MIL/uL — ABNORMAL LOW (ref 4.22–5.81)
RDW: 17.9 % — AB (ref 11.5–15.5)
WBC: 6.5 10*3/uL (ref 4.0–10.5)

## 2015-05-08 NOTE — Discharge Instructions (Signed)
Your x-rays were normal today. Please resume normal dialysis tomorrow as scheduled. Return here for new concerns.
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
## 2015-05-08 NOTE — ED Notes (Signed)
Pt is in stable condition upon d/c and is escorted from ED via wheelchair. 

## 2015-05-08 NOTE — ED Notes (Signed)
Pt was walking down the sidewalk and stumbled, stating that he fell onto his back, ambulatory on scene for EMS, is c/o bilateral leg pain currently and chronic back pain.  Pt is very hard of hearing.

## 2015-05-12 ENCOUNTER — Emergency Department (HOSPITAL_COMMUNITY): Payer: Medicaid Other

## 2015-05-12 ENCOUNTER — Inpatient Hospital Stay (HOSPITAL_COMMUNITY)
Admission: EM | Admit: 2015-05-12 | Discharge: 2015-05-23 | DRG: 640 | Disposition: A | Discharge status: Skilled Nursing Facility | Payer: Medicaid Other | Attending: Internal Medicine | Admitting: Internal Medicine

## 2015-05-12 ENCOUNTER — Encounter (HOSPITAL_COMMUNITY): Payer: Self-pay | Admitting: Vascular Surgery

## 2015-05-12 DIAGNOSIS — Z9115 Patient's noncompliance with renal dialysis: Secondary | ICD-10-CM | POA: Diagnosis present

## 2015-05-12 DIAGNOSIS — I120 Hypertensive chronic kidney disease with stage 5 chronic kidney disease or end stage renal disease: Secondary | ICD-10-CM | POA: Diagnosis present

## 2015-05-12 DIAGNOSIS — E872 Acidosis: Secondary | ICD-10-CM | POA: Diagnosis present

## 2015-05-12 DIAGNOSIS — M898X9 Other specified disorders of bone, unspecified site: Secondary | ICD-10-CM | POA: Diagnosis present

## 2015-05-12 DIAGNOSIS — J449 Chronic obstructive pulmonary disease, unspecified: Secondary | ICD-10-CM | POA: Diagnosis present

## 2015-05-12 DIAGNOSIS — E1122 Type 2 diabetes mellitus with diabetic chronic kidney disease: Secondary | ICD-10-CM | POA: Diagnosis present

## 2015-05-12 DIAGNOSIS — Z992 Dependence on renal dialysis: Secondary | ICD-10-CM

## 2015-05-12 DIAGNOSIS — J811 Chronic pulmonary edema: Secondary | ICD-10-CM | POA: Diagnosis present

## 2015-05-12 DIAGNOSIS — N186 End stage renal disease: Secondary | ICD-10-CM | POA: Diagnosis present

## 2015-05-12 DIAGNOSIS — H906 Mixed conductive and sensorineural hearing loss, bilateral: Secondary | ICD-10-CM | POA: Diagnosis present

## 2015-05-12 DIAGNOSIS — E8770 Fluid overload, unspecified: Principal | ICD-10-CM | POA: Diagnosis present

## 2015-05-12 DIAGNOSIS — R799 Abnormal finding of blood chemistry, unspecified: Secondary | ICD-10-CM

## 2015-05-12 DIAGNOSIS — I2510 Atherosclerotic heart disease of native coronary artery without angina pectoris: Secondary | ICD-10-CM | POA: Diagnosis present

## 2015-05-12 DIAGNOSIS — Z86718 Personal history of other venous thrombosis and embolism: Secondary | ICD-10-CM

## 2015-05-12 DIAGNOSIS — J45909 Unspecified asthma, uncomplicated: Secondary | ICD-10-CM | POA: Diagnosis present

## 2015-05-12 DIAGNOSIS — I4891 Unspecified atrial fibrillation: Secondary | ICD-10-CM | POA: Diagnosis present

## 2015-05-12 DIAGNOSIS — F319 Bipolar disorder, unspecified: Secondary | ICD-10-CM | POA: Diagnosis present

## 2015-05-12 DIAGNOSIS — M1990 Unspecified osteoarthritis, unspecified site: Secondary | ICD-10-CM | POA: Diagnosis present

## 2015-05-12 DIAGNOSIS — R001 Bradycardia, unspecified: Secondary | ICD-10-CM | POA: Diagnosis not present

## 2015-05-12 DIAGNOSIS — G8929 Other chronic pain: Secondary | ICD-10-CM | POA: Diagnosis present

## 2015-05-12 DIAGNOSIS — M549 Dorsalgia, unspecified: Secondary | ICD-10-CM

## 2015-05-12 DIAGNOSIS — I472 Ventricular tachycardia: Secondary | ICD-10-CM | POA: Diagnosis not present

## 2015-05-12 DIAGNOSIS — Z590 Homelessness: Secondary | ICD-10-CM

## 2015-05-12 DIAGNOSIS — Z880 Allergy status to penicillin: Secondary | ICD-10-CM

## 2015-05-12 DIAGNOSIS — Z9114 Patient's other noncompliance with medication regimen: Secondary | ICD-10-CM | POA: Diagnosis present

## 2015-05-12 DIAGNOSIS — D649 Anemia, unspecified: Secondary | ICD-10-CM | POA: Diagnosis present

## 2015-05-12 DIAGNOSIS — I871 Compression of vein: Secondary | ICD-10-CM | POA: Diagnosis present

## 2015-05-12 DIAGNOSIS — G4733 Obstructive sleep apnea (adult) (pediatric): Secondary | ICD-10-CM | POA: Diagnosis present

## 2015-05-12 LAB — I-STAT VENOUS BLOOD GAS, ED
ACID-BASE DEFICIT: 8 mmol/L — AB (ref 0.0–2.0)
BICARBONATE: 18.5 meq/L — AB (ref 20.0–24.0)
O2 SAT: 65 %
PO2 VEN: 38 mmHg (ref 30.0–45.0)
TCO2: 20 mmol/L (ref 0–100)
pCO2, Ven: 38.5 mmHg — ABNORMAL LOW (ref 45.0–50.0)
pH, Ven: 7.29 (ref 7.250–7.300)

## 2015-05-12 LAB — COMPREHENSIVE METABOLIC PANEL
ALBUMIN: 3.7 g/dL (ref 3.5–5.0)
ALK PHOS: 81 U/L (ref 38–126)
ALT: 27 U/L (ref 17–63)
ANION GAP: 19 — AB (ref 5–15)
AST: 29 U/L (ref 15–41)
BUN: 90 mg/dL — ABNORMAL HIGH (ref 6–20)
CALCIUM: 8.2 mg/dL — AB (ref 8.9–10.3)
CHLORIDE: 105 mmol/L (ref 101–111)
CO2: 17 mmol/L — AB (ref 22–32)
Creatinine, Ser: 14.78 mg/dL — ABNORMAL HIGH (ref 0.61–1.24)
GFR calc Af Amer: 3 mL/min — ABNORMAL LOW (ref >60)
GFR calc non Af Amer: 3 mL/min — ABNORMAL LOW (ref >60)
GLUCOSE: 142 mg/dL — AB (ref 65–99)
Potassium: 4.1 mmol/L (ref 3.5–5.1)
SODIUM: 141 mmol/L (ref 135–145)
Total Bilirubin: 0.6 mg/dL (ref 0.3–1.2)
Total Protein: 7.4 g/dL (ref 6.5–8.1)

## 2015-05-12 LAB — CBC
HCT: 32.1 % — ABNORMAL LOW (ref 39.0–52.0)
HEMATOCRIT: 31.4 % — AB (ref 39.0–52.0)
HEMOGLOBIN: 10.4 g/dL — AB (ref 13.0–17.0)
HEMOGLOBIN: 10.3 g/dL — AB (ref 13.0–17.0)
MCH: 27.6 pg (ref 26.0–34.0)
MCH: 27.7 pg (ref 26.0–34.0)
MCHC: 32.4 g/dL (ref 30.0–36.0)
MCHC: 32.8 g/dL (ref 30.0–36.0)
MCV: 85.1 fL (ref 78.0–100.0)
MCV: 84.4 fL (ref 78.0–100.0)
Platelets: 205 10*3/uL (ref 150–400)
Platelets: 217 10*3/uL (ref 150–400)
RBC: 3.72 MIL/uL — ABNORMAL LOW (ref 4.22–5.81)
RBC: 3.77 MIL/uL — ABNORMAL LOW (ref 4.22–5.81)
RDW: 17.9 % — ABNORMAL HIGH (ref 11.5–15.5)
RDW: 17.7 % — AB (ref 11.5–15.5)
WBC: 5.9 10*3/uL (ref 4.0–10.5)
WBC: 6.7 10*3/uL (ref 4.0–10.5)

## 2015-05-12 LAB — I-STAT CHEM 8, ED
BUN: 96 mg/dL — AB (ref 6–20)
CALCIUM ION: 0.99 mmol/L — AB (ref 1.13–1.30)
CHLORIDE: 107 mmol/L (ref 101–111)
CREATININE: 14 mg/dL — AB (ref 0.61–1.24)
GLUCOSE: 141 mg/dL — AB (ref 65–99)
HCT: 34 % — ABNORMAL LOW (ref 39.0–52.0)
Hemoglobin: 11.6 g/dL — ABNORMAL LOW (ref 13.0–17.0)
Potassium: 4.3 mmol/L (ref 3.5–5.1)
SODIUM: 140 mmol/L (ref 135–145)
TCO2: 17 mmol/L (ref 0–100)

## 2015-05-12 LAB — I-STAT TROPONIN, ED: Troponin i, poc: 0.03 ng/mL (ref 0.00–0.08)

## 2015-05-12 MED ORDER — HEPARIN SODIUM (PORCINE) 1000 UNIT/ML DIALYSIS: 1000.0000 [IU] | INTRAMUSCULAR | Status: DC | PRN

## 2015-05-12 MED ORDER — SODIUM CHLORIDE 0.9 % IV SOLN: 100.0000 mL | INTRAVENOUS | Status: DC | PRN

## 2015-05-12 MED ORDER — LIDOCAINE-PRILOCAINE 2.5-2.5 % EX CREA: 1.0000 "application " | TOPICAL_CREAM | CUTANEOUS | Status: DC | PRN

## 2015-05-12 MED ORDER — LIDOCAINE HCL (PF) 1 % IJ SOLN
5.0000 mL | INTRAMUSCULAR | Status: DC | PRN
  Filled 2015-05-12: qty 5

## 2015-05-12 MED ORDER — NEPRO/CARBSTEADY PO LIQD: 237.0000 mL | ORAL | Status: DC | PRN

## 2015-05-12 MED ORDER — PENTAFLUOROPROP-TETRAFLUOROETH EX AERO: 1.0000 "application " | INHALATION_SPRAY | CUTANEOUS | Status: DC | PRN

## 2015-05-12 MED ORDER — ALTEPLASE 2 MG IJ SOLR
2.0000 mg | Freq: Once | INTRAMUSCULAR | Status: DC | PRN
  Filled 2015-05-12: qty 2

## 2015-05-12 NOTE — ED Notes (Signed)
Per Dr.Floyd, pt to be discharged from dialysis.

## 2015-05-12 NOTE — ED Notes (Signed)
Pt reports to the ED for dialysis. He reports he has not been to dialysis in over 3 weeks. Reports SOB and orthopnea. Also has abdominal tightness. Denies any CP or N/V/D. States he has not been going to dialysis because it is too hard to get there. Pt A&Ox4. He is slightly tachypnic. Skin warm and dry.

## 2015-05-12 NOTE — ED Provider Notes (Signed)
CSN: [id number]     Arrival date & time 05/12/15  2027 History   First MD Initiated Contact with Patient 05/12/15 2108     Chief Complaint  Patient presents with  . Vascular Access Problem     (Consider location/radiation/quality/duration/timing/severity/associated sxs/prior Treatment) Patient is a 64 y.o. male presenting with shortness of breath. The history is provided by the patient.  Shortness of Breath Severity:  Moderate Onset quality:  Sudden Duration:  2 days Timing:  Constant Progression:  Worsening Chronicity:  Recurrent Context: not activity   Relieved by:  Nothing Worsened by:  Nothing tried Ineffective treatments:  None tried Associated symptoms: no abdominal pain, no chest pain, no fever, no headaches, no rash, no syncope, no swollen glands and no vomiting    64 yo M with a chief complaint of shortness of breath. Patient is a end-stage renal disease patient has not had dialysis in the past 2 weeks. Patient unable to quantify why he hasn't gone to dialysis. Marked swelling and shortness of breath.   Past Medical History  Diagnosis Date  . Cancer   . Arthritis   . Renal disorder   . Hypertension   . Diabetes mellitus without complication   . Asthma   . COPD (chronic obstructive pulmonary disease)   . Coronary artery disease    Past Surgical History  Procedure Laterality Date  . No past surgeries    . Insertion of dialysis catheter     No family history on file. Social History  Substance Use Topics  . Smoking status: Never Smoker   . Smokeless tobacco: Never Used  . Alcohol Use: No    Review of Systems  Constitutional: Negative for fever and chills.  HENT: Negative for congestion and facial swelling.   Eyes: Negative for discharge and visual disturbance.  Respiratory: Positive for shortness of breath.   Cardiovascular: Negative for chest pain, palpitations and syncope.  Gastrointestinal: Negative for vomiting, abdominal pain and diarrhea.   Musculoskeletal: Negative for myalgias and arthralgias.  Skin: Negative for color change and rash.  Neurological: Negative for tremors, syncope and headaches.  Psychiatric/Behavioral: Negative for confusion and dysphoric mood.      Allergies  Penicillins  Home Medications   Prior to Admission medications   Medication Sig Start Date End Date Taking? Authorizing Provider  acetaminophen (TYLENOL) 325 MG tablet Take 1 tablet (325 mg total) by mouth every 6 (six) hours as needed for moderate pain. 05/03/15   Liberty Handy, MD  aspirin 81 MG chewable tablet Chew 81 mg by mouth daily.    Historical Provider, MD  capsaicin (ZOSTRIX) 0.025 % cream Apply topically 2 (two) times daily as needed (low back pain). 05/03/15   Liberty Handy, MD  furosemide (LASIX) 40 MG tablet Take 2 tablets (80 mg total) by mouth daily. 10/20/12   Blanchie Dessert, MD  ipratropium-albuterol (DUONEB) 0.5-2.5 (3) MG/3ML SOLN Take 3 mLs by nebulization every 6 (six) hours as needed. 05/03/15   Liberty Handy, MD  labetalol (NORMODYNE) 200 MG tablet Take 2 tablets (400 mg total) by mouth 3 (three) times daily. 10/20/12   Blanchie Dessert, MD  mupirocin ointment (BACTROBAN) 2 % Place 1 application into the nose 2 (two) times daily. 05/03/15   Liberty Handy, MD   BP 158/98 mmHg  Pulse 107  Temp(Src) 97.8 F (36.6 C) (Oral)  Resp 23  SpO2 96% Physical Exam  Constitutional: He is oriented to person, place, and time. He appears well-developed and well-nourished.  [place]  HENT:  Head: Normocephalic and atraumatic.  Eyes: EOM are normal. Pupils are equal, round, and reactive to light.  Neck: Normal range of motion. Neck supple. No JVD present.  Cardiovascular: Normal rate and regular rhythm.  Exam reveals no gallop and no friction rub.   No murmur heard. Pulmonary/Chest: No respiratory distress. He has no wheezes. He has rales (diffusely).  Tachypnea  Abdominal: He exhibits no distension. There is no rebound and no guarding.   Musculoskeletal: Normal range of motion. He exhibits edema (Facial bilateral upper extremities as well as the upper portion of the chest.).  Neurological: He is alert and oriented to person, place, and time.  Skin: No rash noted. No pallor.  Psychiatric: He has a normal mood and affect. His behavior is normal.    ED Course  Procedures (including critical care time) Labs Review Labs Reviewed  COMPREHENSIVE METABOLIC PANEL - Abnormal; Notable for the following:    CO2 17 (*)    Glucose, Bld 142 (*)    BUN 90 (*)    Creatinine, Ser 14.78 (*)    Calcium 8.2 (*)    GFR calc non Af Amer 3 (*)    GFR calc Af Amer 3 (*)    Anion gap 19 (*)    All other components within normal limits  CBC - Abnormal; Notable for the following:    RBC 3.77 (*)    Hemoglobin 10.4 (*)    HCT 32.1 (*)    RDW 17.9 (*)    All other components within normal limits  CBC - Abnormal; Notable for the following:    RBC 3.72 (*)    Hemoglobin 10.3 (*)    HCT 31.4 (*)    RDW 17.7 (*)    All other components within normal limits  I-STAT CHEM 8, ED - Abnormal; Notable for the following:    BUN 96 (*)    Creatinine, Ser 14.00 (*)    Glucose, Bld 141 (*)    Calcium, Ion 0.99 (*)    Hemoglobin 11.6 (*)    HCT 34.0 (*)    All other components within normal limits  I-STAT VENOUS BLOOD GAS, ED - Abnormal; Notable for the following:    pCO2, Ven 38.5 (*)    Bicarbonate 18.5 (*)    Acid-base deficit 8.0 (*)    All other components within normal limits  BLOOD GAS, VENOUS  RENAL FUNCTION PANEL  Randolm Idol, ED    Imaging Review Dg Chest 2 View  05/12/2015   CLINICAL DATA:  Patient with worsening shortness of breath. No fever.  EXAM: CHEST  2 VIEW  COMPARISON:  Chest radiograph 04/28/2015  FINDINGS: Stable cardiomegaly. Stent graft material projects over the left upper hemi thorax. Pulmonary vascular redistribution with mild interstitial bilateral pulmonary opacities. Mid thoracic spine degenerative changes.   IMPRESSION: Cardiomegaly with mild interstitial pulmonary edema.   Electronically Signed   By: Lovey Newcomer M.D.   On: 05/12/2015 21:20   I have personally reviewed and evaluated these images and lab results as part of my medical decision-making.   EKG Interpretation   Date/Time:  Friday May 12 2015 20:39:55 EDT Ventricular Rate:  110 PR Interval:    QRS Duration: 72 QT Interval:  362 QTC Calculation: 489 R Axis:   56 Text Interpretation:  Atrial fibrillation with rapid ventricular response  T wave abnormality, consider inferolateral ischemia Abnormal ECG No  significant change since last tracing Confirmed by Athalee Esterline MD, DANIEL  ([id number]) on 05/12/2015 11:57:08 PM  MDM   Final diagnoses:  Hypervolemia, unspecified hypervolemia type  ESRD on dialysis  Elevated BUN    64 yo M noncompliant on dialysis. Most recent dialysis was last time he was hospitalized. Patient tachypnea but not requiring oxygen. Hypertensive. Labs consistent with acidosis uremia. Chest x-ray pulmonary edema.  Case discussed with nephrology Dr. Augustin Coupe, will schedule urgent dialysis.  Patient taken dialysis. Feel likely that would solve all his underlying issues. We'll have him discharged from there.   Medications given during this visit Medications  pentafluoroprop-tetrafluoroeth (GEBAUERS) aerosol 1 application (not administered)  lidocaine (PF) (XYLOCAINE) 1 % injection 5 mL (not administered)  lidocaine-prilocaine (EMLA) cream 1 application (not administered)  0.9 %  sodium chloride infusion (not administered)  0.9 %  sodium chloride infusion (not administered)  feeding supplement (NEPRO CARB STEADY) liquid 237 mL (not administered)  heparin injection 1,000 Units (not administered)  alteplase (CATHFLO ACTIVASE) injection 2 mg (not administered)  heparin injection 1,000 Units (not administered)    New Prescriptions   No medications on file     The patient appears reasonably screen and/or  stabilized for discharge and I doubt any other medical condition or other EMC requiring further screening, evaluation, or treatment in the ED at this time prior to discharge.    Deno Etienne, DO 05/12/15 2357

## 2015-05-12 NOTE — Consult Note (Signed)
[facility] Consult Note    Chief Complaint: Dyspnea  Reason for consult: To manage dialysis and dialysis related needs  Consulting Physician: Dr. Deno Etienne  Assessment/Plan: 1. 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 2. ESRD - terrible candidate for dialysis with severe noncompliance; very poor understanding of his disease process.  - Plan on emergent HD tonight; machine being set up and he should be on by 1130PM. - May need HD tomorrow as well to help with volume control; I'm sure he's well above his EDW. 3. Anemia - Stable. 4. Metabolic bone disease - Should be managed as an outpatient but with his long term noncompliance likely impossible to manage effectively. 5. HTN - secondary to hypertension and noncompliance with meds as well. Should challenge his dry weight while he's in the hospital. 6. Volume - Challenge and obtain new dry weight. 7. ?Bipolar disorder 8. Homeless? - SW consult to assess.  HPI: Noah Lewis is an 64 y.o. male with PMhx significant for HTN, DM, COPD, CAD and apparently medical noncompliance. He also has ESRD- dialyses at the [facility] on [address] phone number [phone]. He was recently hospitalized for volume overload having missed dialysis for many weeks; he was seen by psychiatry at that time with a chart diagnosis of bipolar disorder but did not meet criteria for inpatient commitment. He has not been back to dialysis and his last treatment was on 05/01/2015 while at [facility]. He at the time of discharge stated that he would not go back to [facility] or commit to any outpatient dialysis regimen here in [place]. He now presents to the ER today with SOB and facial swelling; of note he did have a RIJ  tunneled catheter removed during the last hospitalization. He denies chest pain or has worsened orthopnea and facial swelling. He obviously has a poor understanding of his disease process.  He denies any loss of consciousness, dizziness, nausea or vomiting. He also denies any fever, chills.     ROS Review of systems not obtained due to patient factors. Very unreliable ROS obtained - I don't believe any of it. He was very somnolent at times and was arousable with deep stimuli.   Past Medical History  Past Medical History  Diagnosis Date  . Cancer   . Arthritis   . Renal disorder   . Hypertension   . Diabetes mellitus without complication   . Asthma   . COPD (chronic obstructive pulmonary disease)   . Coronary artery disease    Past Surgical History  Past Surgical History  Procedure Laterality Date  . No past surgeries    . Insertion of dialysis catheter     Family History No family history on file. Social History  reports that he has never smoked. He has never used smokeless tobacco. He reports that he does not drink alcohol or use illicit drugs. Allergies  Allergies  Allergen Reactions  . Penicillins Other (See Comments)    unsure   Home medications Prior to Admission medications   Medication Sig Start Date End Date Taking? Authorizing Provider  acetaminophen (TYLENOL) 325 MG tablet Take 1 tablet (325 mg total) by mouth every 6 (six) hours as needed for moderate pain. 05/03/15   Liberty Handy, MD  aspirin 81 MG chewable tablet Sarina Ser  81 mg by mouth daily.    Historical Provider, MD  capsaicin (ZOSTRIX) 0.025 % cream Apply topically 2 (two) times daily as needed (low back pain). 05/03/15   Liberty Handy, MD  furosemide (LASIX) 40 MG tablet Take 2 tablets (80 mg total) by mouth daily. 10/20/12   Blanchie Dessert, MD  ipratropium-albuterol (DUONEB) 0.5-2.5 (3) MG/3ML SOLN Take 3 mLs by nebulization every 6 (six) hours as needed. 05/03/15   Liberty Handy, MD  labetalol (NORMODYNE) 200 MG  tablet Take 2 tablets (400 mg total) by mouth 3 (three) times daily. 10/20/12   Blanchie Dessert, MD  mupirocin ointment (BACTROBAN) 2 % Place 1 application into the nose 2 (two) times daily. 05/03/15   Liberty Handy, MD   No current facility-administered medications for this encounter.  Current outpatient prescriptions:  .  acetaminophen (TYLENOL) 325 MG tablet, Take 1 tablet (325 mg total) by mouth every 6 (six) hours as needed for moderate pain., Disp: 50 tablet, Rfl: 1 .  aspirin 81 MG chewable tablet, Chew 81 mg by mouth daily., Disp: , Rfl:  .  capsaicin (ZOSTRIX) 0.025 % cream, Apply topically 2 (two) times daily as needed (low back pain)., Disp: 60 g, Rfl: 0 .  furosemide (LASIX) 40 MG tablet, Take 2 tablets (80 mg total) by mouth daily., Disp: 30 tablet, Rfl: 0 .  ipratropium-albuterol (DUONEB) 0.5-2.5 (3) MG/3ML SOLN, Take 3 mLs by nebulization every 6 (six) hours as needed., Disp: 360 mL, Rfl: 2 .  labetalol (NORMODYNE) 200 MG tablet, Take 2 tablets (400 mg total) by mouth 3 (three) times daily., Disp: 90 tablet, Rfl: 0 .  mupirocin ointment (BACTROBAN) 2 %, Place 1 application into the nose 2 (two) times daily., Disp: 22 g, Rfl: 0 Liver Function Tests  Recent Labs Lab 05/12/15 2113  AST 29  ALT 27  ALKPHOS 81  BILITOT 0.6  PROT 7.4  ALBUMIN 3.7   No results for input(s): LIPASE, AMYLASE in the last 168 hours. CBC  Recent Labs Lab 05/08/15 1650 05/12/15 2113 05/12/15 2127  WBC 6.5 6.7  --   NEUTROABS 4.8  --   --   HGB 10.8* 10.4* 11.6*  HCT 32.9* 32.1* 34.0*  MCV 83.5 85.1  --   PLT 205 205  --    Basic Metabolic Panel  Recent Labs Lab 05/08/15 1650 05/12/15 2113 05/12/15 2127  NA 137 141 140  K 4.5 4.1 4.3  CL 101 105 107  CO2 18* 17*  --   GLUCOSE 76 142* 141*  BUN 89* 90* 96*  CREATININE 12.59* 14.78* 14.00*  CALCIUM 8.8* 8.2*  --     Physical Exam:  Blood pressure 163/117, pulse 110, temperature 97.8 F (36.6 C), temperature source Oral, resp.  rate 22, SpO2 99 %. Gen: Increase work of breathing, tachypneic Skin: no rash, cyanosis HEENT:  EOMI, sclera anicteric, throat clear Neck: Positive JVD, no LAD Chest: Rhonchi CV: RRR, pedal pulses 2+ Abdomen: soft, obese, NDNT Ext: trace edema, left arm markedly swollen Neuro: lethargic, nonfocal, moving all 4 extremities  Outpatient HD: [facility] on [place] [phone]   Otelia Santee  MD Work  [phone] 05/12/2015, 10:29 PM

## 2015-05-13 ENCOUNTER — Encounter (HOSPITAL_COMMUNITY): Payer: Self-pay | Admitting: Internal Medicine

## 2015-05-13 DIAGNOSIS — N186 End stage renal disease: Secondary | ICD-10-CM

## 2015-05-13 DIAGNOSIS — Z992 Dependence on renal dialysis: Secondary | ICD-10-CM | POA: Diagnosis not present

## 2015-05-13 DIAGNOSIS — E1122 Type 2 diabetes mellitus with diabetic chronic kidney disease: Secondary | ICD-10-CM | POA: Diagnosis present

## 2015-05-13 DIAGNOSIS — R799 Abnormal finding of blood chemistry, unspecified: Secondary | ICD-10-CM | POA: Diagnosis present

## 2015-05-13 DIAGNOSIS — Z9114 Patient's other noncompliance with medication regimen: Secondary | ICD-10-CM | POA: Diagnosis present

## 2015-05-13 DIAGNOSIS — E872 Acidosis: Secondary | ICD-10-CM | POA: Diagnosis present

## 2015-05-13 DIAGNOSIS — I4891 Unspecified atrial fibrillation: Secondary | ICD-10-CM | POA: Diagnosis present

## 2015-05-13 DIAGNOSIS — Z86718 Personal history of other venous thrombosis and embolism: Secondary | ICD-10-CM | POA: Diagnosis not present

## 2015-05-13 DIAGNOSIS — E8779 Other fluid overload: Secondary | ICD-10-CM | POA: Diagnosis not present

## 2015-05-13 DIAGNOSIS — J449 Chronic obstructive pulmonary disease, unspecified: Secondary | ICD-10-CM | POA: Diagnosis present

## 2015-05-13 DIAGNOSIS — G4733 Obstructive sleep apnea (adult) (pediatric): Secondary | ICD-10-CM | POA: Diagnosis present

## 2015-05-13 DIAGNOSIS — G8929 Other chronic pain: Secondary | ICD-10-CM | POA: Diagnosis present

## 2015-05-13 DIAGNOSIS — H906 Mixed conductive and sensorineural hearing loss, bilateral: Secondary | ICD-10-CM | POA: Diagnosis present

## 2015-05-13 DIAGNOSIS — M1990 Unspecified osteoarthritis, unspecified site: Secondary | ICD-10-CM | POA: Diagnosis present

## 2015-05-13 DIAGNOSIS — E8770 Fluid overload, unspecified: Secondary | ICD-10-CM | POA: Diagnosis present

## 2015-05-13 DIAGNOSIS — J811 Chronic pulmonary edema: Secondary | ICD-10-CM | POA: Diagnosis present

## 2015-05-13 DIAGNOSIS — R001 Bradycardia, unspecified: Secondary | ICD-10-CM | POA: Diagnosis not present

## 2015-05-13 DIAGNOSIS — M898X9 Other specified disorders of bone, unspecified site: Secondary | ICD-10-CM | POA: Diagnosis present

## 2015-05-13 DIAGNOSIS — I2510 Atherosclerotic heart disease of native coronary artery without angina pectoris: Secondary | ICD-10-CM | POA: Diagnosis present

## 2015-05-13 DIAGNOSIS — I120 Hypertensive chronic kidney disease with stage 5 chronic kidney disease or end stage renal disease: Secondary | ICD-10-CM | POA: Diagnosis present

## 2015-05-13 DIAGNOSIS — I871 Compression of vein: Secondary | ICD-10-CM | POA: Diagnosis present

## 2015-05-13 DIAGNOSIS — Z9115 Patient's noncompliance with renal dialysis: Secondary | ICD-10-CM | POA: Diagnosis present

## 2015-05-13 DIAGNOSIS — Z880 Allergy status to penicillin: Secondary | ICD-10-CM | POA: Diagnosis not present

## 2015-05-13 DIAGNOSIS — F319 Bipolar disorder, unspecified: Secondary | ICD-10-CM | POA: Diagnosis present

## 2015-05-13 DIAGNOSIS — F3130 Bipolar disorder, current episode depressed, mild or moderate severity, unspecified: Secondary | ICD-10-CM | POA: Diagnosis not present

## 2015-05-13 DIAGNOSIS — I472 Ventricular tachycardia: Secondary | ICD-10-CM | POA: Diagnosis not present

## 2015-05-13 DIAGNOSIS — D649 Anemia, unspecified: Secondary | ICD-10-CM | POA: Diagnosis present

## 2015-05-13 DIAGNOSIS — Z590 Homelessness: Secondary | ICD-10-CM | POA: Diagnosis not present

## 2015-05-13 DIAGNOSIS — J45909 Unspecified asthma, uncomplicated: Secondary | ICD-10-CM | POA: Diagnosis present

## 2015-05-13 LAB — PROTIME-INR
INR: 1.36 (ref 0.00–1.49)
Prothrombin Time: 16.9 seconds — ABNORMAL HIGH (ref 11.6–15.2)

## 2015-05-13 LAB — RENAL FUNCTION PANEL
ALBUMIN: 3.4 g/dL — AB (ref 3.5–5.0)
ANION GAP: 15 (ref 5–15)
ANION GAP: 19 — AB (ref 5–15)
Albumin: 3.6 g/dL (ref 3.5–5.0)
BUN: 48 mg/dL — AB (ref 6–20)
BUN: 91 mg/dL — AB (ref 6–20)
CHLORIDE: 106 mmol/L (ref 101–111)
CO2: 22 mmol/L (ref 22–32)
CO2: 15 mmol/L — AB (ref 22–32)
Calcium: 8.2 mg/dL — ABNORMAL LOW (ref 8.9–10.3)
Calcium: 8.6 mg/dL — ABNORMAL LOW (ref 8.9–10.3)
Chloride: 102 mmol/L (ref 101–111)
Creatinine, Ser: 14.49 mg/dL — ABNORMAL HIGH (ref 0.61–1.24)
Creatinine, Ser: 9.18 mg/dL — ABNORMAL HIGH (ref 0.61–1.24)
GFR calc Af Amer: 4 mL/min — ABNORMAL LOW (ref >60)
GFR calc Af Amer: 6 mL/min — ABNORMAL LOW (ref >60)
GFR calc non Af Amer: 3 mL/min — ABNORMAL LOW (ref >60)
GFR calc non Af Amer: 5 mL/min — ABNORMAL LOW (ref >60)
GLUCOSE: 79 mg/dL (ref 65–99)
GLUCOSE: 89 mg/dL (ref 65–99)
PHOSPHORUS: 5.4 mg/dL — AB (ref 2.5–4.6)
PHOSPHORUS: 8.6 mg/dL — AB (ref 2.5–4.6)
POTASSIUM: 4.3 mmol/L (ref 3.5–5.1)
POTASSIUM: 4.3 mmol/L (ref 3.5–5.1)
Sodium: 139 mmol/L (ref 135–145)
Sodium: 140 mmol/L (ref 135–145)

## 2015-05-13 LAB — GLUCOSE, CAPILLARY
Glucose-Capillary: 75 mg/dL (ref 65–99)
Glucose-Capillary: 85 mg/dL (ref 65–99)

## 2015-05-13 LAB — PHOSPHORUS: Phosphorus: 5.3 mg/dL — ABNORMAL HIGH (ref 2.5–4.6)

## 2015-05-13 MED ORDER — WARFARIN - PHARMACIST DOSING INPATIENT
Freq: Every day | Status: DC
  Administered 2015-05-13 – 2015-05-16 (×3): 1
  Administered 2015-05-18 – 2015-05-19 (×2)

## 2015-05-13 MED ORDER — INSULIN ASPART 100 UNIT/ML ~~LOC~~ SOLN: 0.0000 [IU] | Freq: Every day | SUBCUTANEOUS | Status: DC

## 2015-05-13 MED ORDER — SODIUM CHLORIDE 0.9 % IV SOLN: 100.0000 mL | INTRAVENOUS | Status: DC | PRN

## 2015-05-13 MED ORDER — LABETALOL HCL 200 MG PO TABS
400.0000 mg | ORAL_TABLET | Freq: Three times a day (TID) | ORAL | Status: DC
  Administered 2015-05-13 (×2): 400 mg via ORAL
  Filled 2015-05-13 (×2): qty 2

## 2015-05-13 MED ORDER — LABETALOL HCL 200 MG PO TABS
300.0000 mg | ORAL_TABLET | Freq: Three times a day (TID) | ORAL | Status: DC
  Filled 2015-05-13 (×2): qty 1

## 2015-05-13 MED ORDER — ALTEPLASE 2 MG IJ SOLR
2.0000 mg | Freq: Once | INTRAMUSCULAR | Status: DC | PRN
  Filled 2015-05-13: qty 2

## 2015-05-13 MED ORDER — LABETALOL HCL 200 MG PO TABS: 200.0000 mg | ORAL_TABLET | Freq: Three times a day (TID) | ORAL | Status: DC

## 2015-05-13 MED ORDER — ALTEPLASE 2 MG IJ SOLR: 2.0000 mg | Freq: Once | INTRAMUSCULAR | Status: DC | PRN

## 2015-05-13 MED ORDER — WARFARIN VIDEO: Freq: Once | Status: DC

## 2015-05-13 MED ORDER — LIDOCAINE HCL (PF) 1 % IJ SOLN: 5.0000 mL | INTRAMUSCULAR | Status: DC | PRN

## 2015-05-13 MED ORDER — SODIUM CHLORIDE 0.9 % IJ SOLN
3.0000 mL | Freq: Two times a day (BID) | INTRAMUSCULAR | Status: DC
  Administered 2015-05-13 – 2015-05-23 (×19): 3 mL via INTRAVENOUS

## 2015-05-13 MED ORDER — LIDOCAINE-PRILOCAINE 2.5-2.5 % EX CREA: 1.0000 "application " | TOPICAL_CREAM | CUTANEOUS | Status: DC | PRN

## 2015-05-13 MED ORDER — IPRATROPIUM-ALBUTEROL 0.5-2.5 (3) MG/3ML IN SOLN: 3.0000 mL | Freq: Four times a day (QID) | RESPIRATORY_TRACT | Status: DC | PRN

## 2015-05-13 MED ORDER — INSULIN ASPART 100 UNIT/ML ~~LOC~~ SOLN
0.0000 [IU] | Freq: Three times a day (TID) | SUBCUTANEOUS | Status: DC
  Administered 2015-05-16: 1 [IU] via SUBCUTANEOUS

## 2015-05-13 MED ORDER — HEPARIN SODIUM (PORCINE) 5000 UNIT/ML IJ SOLN
5000.0000 [IU] | Freq: Three times a day (TID) | INTRAMUSCULAR | Status: DC
  Administered 2015-05-13 – 2015-05-16 (×3): 5000 [IU] via SUBCUTANEOUS
  Filled 2015-05-13 (×8): qty 1

## 2015-05-13 MED ORDER — PENTAFLUOROPROP-TETRAFLUOROETH EX AERO: 1.0000 "application " | INHALATION_SPRAY | CUTANEOUS | Status: DC | PRN

## 2015-05-13 MED ORDER — WARFARIN SODIUM 7.5 MG PO TABS
7.5000 mg | ORAL_TABLET | Freq: Once | ORAL | Status: AC
  Administered 2015-05-13: 7.5 mg via ORAL
  Filled 2015-05-13: qty 1

## 2015-05-13 MED ORDER — ASPIRIN 81 MG PO CHEW
81.0000 mg | CHEWABLE_TABLET | Freq: Every day | ORAL | Status: DC
  Administered 2015-05-13 – 2015-05-23 (×10): 81 mg via ORAL
  Filled 2015-05-13 (×11): qty 1

## 2015-05-13 MED ORDER — NEPRO/CARBSTEADY PO LIQD: 237.0000 mL | ORAL | Status: DC | PRN

## 2015-05-13 MED ORDER — HEPARIN SODIUM (PORCINE) 1000 UNIT/ML DIALYSIS: 1000.0000 [IU] | INTRAMUSCULAR | Status: DC | PRN

## 2015-05-13 MED ORDER — COUMADIN BOOK
Freq: Once | Status: AC
  Administered 2015-05-13: 1
  Filled 2015-05-13: qty 1

## 2015-05-13 NOTE — ED Provider Notes (Signed)
Patient seen earlier tonight by Dr. Tyrone Nine, sent to dialysis.  Per nephrology notes, there was concern as patient was somnolent and felt that he needed further dialysis today.  It is unclear what transpired, the patient was in the process of being discharged from the dialysis unit and was returned to the emergency department.  On my exam, patient is somnolent, homeless and has no current plans for further dialysis or housing.  I spoke with Dr. Augustin Coupe, on call for nephrology, who wishes patient to have a second dialysis later today and was also concerned about his somnolence.  Will discuss with hospitalist for admission given encephalopathy, end-stage renal disease in need of dialysis later today.  Linton Flemings, MD 05/13/15 [phone]

## 2015-05-13 NOTE — Progress Notes (Signed)
PT Cancellation Note  Patient Details Name: Noah Lewis MRN: [id number] DOB: [birth year]   Cancelled Treatment:    Reason Eval/Treat Not Completed: Fatigue/lethargy limiting ability to participate;Other (comment) (Had HD yesterday and now very asleep).  Opens his eyes briefly then back to sleep.  Nursing was informed.  Will retry if able.   Ramond Dial 05/13/2015, 12:25 PM   Mee Hives, PT MS Acute Rehab Dept. Number: [phone] [id number] and [place] [phone]

## 2015-05-13 NOTE — Care Management (Signed)
Pt start hemodialysis at 00:10am and completed at 03:10am.Report got from [place].Pt is confused and I called Dr.Lin.Order received as follow:send patient to ED.Charge nurse who name is Gillermo Murdoch tell me that  pt allready discharged from hemodialysis.I notified Dr.Lin again.order received as follow:sand patient to ED d/t patient unstable.Pts BP 155/90.HR 88.R24.O2 sat 95% 2l O2 in use.pt taking off 3ls.

## 2015-05-13 NOTE — Progress Notes (Signed)
Pt admitted to room 513 from ed. Pt drowsy but arousable will answer some admission questions. However, pt is alert ot person and place only . He is HOH in right ear per pt. He stated that his family is "having problems". Iv patent. Fistula to left upper arm pos. For thrill and bruitt. No skin issues noted, except dry skin noted to lower extremities.

## 2015-05-13 NOTE — Progress Notes (Signed)
Pt afib on telemetry. Hr. 106 md aware. Will cont. To monitor.

## 2015-05-13 NOTE — Progress Notes (Signed)
ANTICOAGULATION CONSULT NOTE - Initial Consult  Pharmacy Consult for warfarin Indication: atrial fibrillation  Allergies  Allergen Reactions  . Penicillins Other (See Comments)    unsure    Patient Measurements: Weight: 79.7 kg Height: 5\' 7"   Vital Signs: Temp: 97.8 F (36.6 C) (08/20 0604) Temp Source: Oral (08/20 0604) BP: 138/104 mmHg (08/20 0604) Pulse Rate: 86 (08/20 0604)  Labs:  Recent Labs  05/12/15 2113 05/12/15 2127 05/12/15 2339 05/13/15 0755  HGB 10.4* 11.6* 10.3*  --   HCT 32.1* 34.0* 31.4*  --   PLT 205  --  217  --   CREATININE 14.78* 14.00* 14.49* 9.18*    Estimated Creatinine Clearance: 8.3 mL/min (by C-G formula based on Cr of 9.18).   Medical History: Past Medical History  Diagnosis Date  . Cancer   . Arthritis   . Renal disorder   . Hypertension   . Diabetes mellitus without complication   . Asthma   . COPD (chronic obstructive pulmonary disease)   . Coronary artery disease     Assessment: 63yoM admitted 05/12/2015 presents with c/o worsening SOB. Pt has not been to HD since last admission on 05/03/15. He also c/o weakness, fatigue, and worsening back pain. Went for emergent HD d/t significant volume overload.  PMH: ESRD, SVC syndrome, DM2, HTN, COPD  Mr. Kozma was found to be in Afib and pharmacy is consulted to dose warfarin. Baseline INR = 1.36, H/H stable, Plts stable, no s/sx of bleeding noted. Warfarin predictor points = 7  Goal of Therapy:  INR 2-3 Monitor platelets by anticoagulation protocol: Yes   Plan:  - Will give warfarin 7.5 mg x1 tonight - Monitor daily INR, CBC, s/sx of bleeding  Dimitri Ped, PharmD. Clinical Pharmacist Resident Pager: [phone]

## 2015-05-13 NOTE — ED Notes (Signed)
Pt hooked up to the monitor with the 5 lead, BP cuff and pulse ox ?

## 2015-05-13 NOTE — Progress Notes (Signed)
Received alert from [place], pt's HR dropped down to 38/min. It was not sustained. Pt was asymptomatic and was sleeping quietly. VSS except for HR. Pt also refused for fingerstick check. On-call provider made aware. No new orders received at this time. Will cont to monitor.

## 2015-05-13 NOTE — H&P (Signed)
Date: 05/13/2015               Patient Name:  Noah Lewis MRN: [id number]  DOB: 10-31-1950 Age / Sex: 64 y.o., male   PCP: Provider Not In System         Medical Service: Internal Medicine Teaching Service         Attending Physician: Dr. Axel Filler, MD    First Contact: Dr. Marijean Bravo Pager: [phone]  Second Contact: Dr. Redmond Pulling Pager: [phone]       After Hours (After 5p/  First Contact Pager: [phone]  weekends / holidays): Second Contact Pager: [phone]   Chief Complaint: SOB, weakness  History of Present Illness: Noah Lewis is a 64 y.o. male w/ PMHx of ESRD, SVC Syndrome, DM type II, HTN, and COPD, presents to the ED w/ complaints of worsening SOB. Patient states he has not been to HD since he was admitted last (05/03/15) as he says he was having some family issues. He also admits to having associated weakness, fatigue, and worsening back pain. Patient is quite hard of hearing, unable to give adequate history as he was somewhat unable to hear questions asked. Went for emergent HD d/t volume overload, however, per nephrology, patient will require further HD given his significant volume overload. Patient denies suicidal ideations, states he simply did not go to HD b/c he was having family problems.    Meds: Current Facility-Administered Medications  Medication Dose Route Frequency Provider Last Rate Last Dose  . 0.9 %  sodium chloride infusion  100 mL Intravenous PRN Dwana Melena, MD      . 0.9 %  sodium chloride infusion  100 mL Intravenous PRN Dwana Melena, MD      . 0.9 %  sodium chloride infusion  100 mL Intravenous PRN Shelle Iron, NP      . 0.9 %  sodium chloride infusion  100 mL Intravenous PRN Shelle Iron, NP      . 0.9 %  sodium chloride infusion  100 mL Intravenous PRN Corliss Parish, MD      . 0.9 %  sodium chloride infusion  100 mL Intravenous PRN Corliss Parish, MD      . alteplase (CATHFLO ACTIVASE) injection 2 mg  2 mg Intracatheter Once  PRN Dwana Melena, MD      . alteplase (CATHFLO ACTIVASE) injection 2 mg  2 mg Intracatheter Once PRN Shelle Iron, NP      . alteplase (CATHFLO ACTIVASE) injection 2 mg  2 mg Intracatheter Once PRN Corliss Parish, MD      . aspirin chewable tablet 81 mg  81 mg Oral Daily Corky Sox, MD      . feeding supplement (NEPRO CARB STEADY) liquid 237 mL  237 mL Oral PRN Dwana Melena, MD      . feeding supplement (NEPRO CARB STEADY) liquid 237 mL  237 mL Oral PRN Shelle Iron, NP      . feeding supplement (NEPRO CARB STEADY) liquid 237 mL  237 mL Oral PRN Corliss Parish, MD      . heparin injection 1,000 Units  1,000 Units Dialysis PRN Dwana Melena, MD      . heparin injection 1,000 Units  1,000 Units Dialysis PRN Dwana Melena, MD      . heparin injection 1,000 Units  1,000 Units Dialysis PRN Shelle Iron, NP      . heparin injection 1,000 Units  1,000 Units Dialysis PRN Lambert Keto  Moshe Cipro, MD      . heparin injection 5,000 Units  5,000 Units Subcutaneous 3 times per day Corky Sox, MD      . insulin aspart (novoLOG) injection 0-5 Units  0-5 Units Subcutaneous QHS Corky Sox, MD      . insulin aspart (novoLOG) injection 0-9 Units  0-9 Units Subcutaneous TID WC Corky Sox, MD      . ipratropium-albuterol (DUONEB) 0.5-2.5 (3) MG/3ML nebulizer solution 3 mL  3 mL Nebulization Q6H PRN Corky Sox, MD      . labetalol (NORMODYNE) tablet 400 mg  400 mg Oral 3 times per day Corky Sox, MD      . lidocaine (PF) (XYLOCAINE) 1 % injection 5 mL  5 mL Intradermal PRN Dwana Melena, MD      . lidocaine (PF) (XYLOCAINE) 1 % injection 5 mL  5 mL Intradermal PRN Shelle Iron, NP      . lidocaine (PF) (XYLOCAINE) 1 % injection 5 mL  5 mL Intradermal PRN Corliss Parish, MD      . lidocaine-prilocaine (EMLA) cream 1 application  1 application Topical PRN Dwana Melena, MD      . lidocaine-prilocaine (EMLA) cream 1 application  1 application Topical PRN Shelle Iron, NP      .  lidocaine-prilocaine (EMLA) cream 1 application  1 application Topical PRN Corliss Parish, MD      . pentafluoroprop-tetrafluoroeth Landry Dyke) aerosol 1 application  1 application Topical PRN Dwana Melena, MD      . pentafluoroprop-tetrafluoroeth Landry Dyke) aerosol 1 application  1 application Topical PRN Shelle Iron, NP      . pentafluoroprop-tetrafluoroeth (GEBAUERS) aerosol 1 application  1 application Topical PRN Corliss Parish, MD      . sodium chloride 0.9 % injection 3 mL  3 mL Intravenous Q12H Corky Sox, MD        Allergies: Allergies as of 05/12/2015 - Review Complete 05/08/2015  Allergen Reaction Noted  . Penicillins Other (See Comments) 10/18/2012   Past Medical History  Diagnosis Date  . Cancer   . Arthritis   . Renal disorder   . Hypertension   . Diabetes mellitus without complication   . Asthma   . COPD (chronic obstructive pulmonary disease)   . Coronary artery disease    Past Surgical History  Procedure Laterality Date  . No past surgeries    . Insertion of dialysis catheter     No family history on file. Social History   Social History  . Marital Status: Married    Spouse Name: N/A  . Number of Children: N/A  . Years of Education: N/A   Occupational History  . Not on file.   Social History Main Topics  . Smoking status: Never Smoker   . Smokeless tobacco: Never Used  . Alcohol Use: No  . Drug Use: No  . Sexual Activity: Not on file   Other Topics Concern  . Not on file   Social History Narrative    Review of Systems:  General: Positive for fatigue. Denies fever, diaphoresis, appetite change.  Respiratory: Positive for SOB. Denies cough, and wheezing.   Cardiovascular: Denies chest pain and palpitations.  Gastrointestinal: Denies nausea, vomiting, abdominal pain, and diarrhea Musculoskeletal: Positive for myalgias and back pain. Denies arthralgias, and gait problem.  Neurological: Positive for weakness. Denies dizziness,  syncope, lightheadedness, and headaches.  Psychiatric/Behavioral: Denies mood changes, sleep disturbance, and agitation.   Physical Exam: Blood pressure 138/104,  pulse 86, temperature 97.8 F (36.6 C), temperature source Oral, resp. rate 20, SpO2 96 %.  General: Intermittently somnolent but overall alert, cooperative, NAD. Hard of hearing.  HEENT: Right conjunctival swelling. Bilateral exophthalmos w/ scleral discoloration. Facial swelling, plethora, and venous distension on head and neck. Poor dentition. Loud breathing, obvious congestion.  Neck: venous congestion as stated above, extending into chest wall. Full range of motion without pain, supple, no lymphadenopathy or carotid bruits.  Lungs: Clear to ascultation bilaterally, normal work of respiration, no wheezes, rales, rhonchi. Poor inspiratory effort.  Heart: RRR, no murmurs, gallops, or rubs Abdomen: Soft, non-tender, non-distended, BS + Extremities: No cyanosis, clubbing, or edema. Bilateral venous stasis changes. LUE AVF.  Neurologic: Alert & oriented x3, cranial nerves generally intact, strength grossly intact, sensation intact to light touch   Lab results: Basic Metabolic Panel:  Recent Labs  05/12/15 2113 05/12/15 2127 05/12/15 2339  NA 141 140 140  K 4.1 4.3 4.3  CL 105 107 106  CO2 17*  --  15*  GLUCOSE 142* 141* 89  BUN 90* 96* 91*  CREATININE 14.78* 14.00* 14.49*  CALCIUM 8.2*  --  8.2*  PHOS  --   --  8.6*   Liver Function Tests:  Recent Labs  05/12/15 2113 05/12/15 2339  AST 29  --   ALT 27  --   ALKPHOS 81  --   BILITOT 0.6  --   PROT 7.4  --   ALBUMIN 3.7 3.6   CBC:  Recent Labs  05/12/15 2113 05/12/15 2127 05/12/15 2339  WBC 6.7  --  5.9  HGB 10.4* 11.6* 10.3*  HCT 32.1* 34.0* 31.4*  MCV 85.1  --  84.4  PLT 205  --  217   Urine Drug Screen: Drugs of Abuse     Component Value Date/Time   LABOPIA NONE DETECTED 10/26/2007 1400   COCAINSCRNUR NONE DETECTED 10/26/2007 1400   LABBENZ  NONE DETECTED 10/26/2007 1400   AMPHETMU NONE DETECTED 10/26/2007 1400   THCU NONE DETECTED 10/26/2007 1400   LABBARB  10/26/2007 1400    NONE DETECTED        DRUG SCREEN FOR MEDICAL PURPOSES ONLY.  IF CONFIRMATION IS NEEDED FOR ANY PURPOSE, NOTIFY LAB WITHIN 5 DAYS.     Imaging results:  Dg Chest 2 View  05/12/2015   CLINICAL DATA:  Patient with worsening shortness of breath. No fever.  EXAM: CHEST  2 VIEW  COMPARISON:  Chest radiograph 04/28/2015  FINDINGS: Stable cardiomegaly. Stent graft material projects over the left upper hemi thorax. Pulmonary vascular redistribution with mild interstitial bilateral pulmonary opacities. Mid thoracic spine degenerative changes.  IMPRESSION: Cardiomegaly with mild interstitial pulmonary edema.   Electronically Signed   By: Lovey Newcomer M.D.   On: 05/12/2015 21:20    Other results: EKG: Atrial fibrillation w/ rate in the 100's.   Assessment & Plan by Problem: 63 y.o. male w/ PMHx of ESRD, SVC Syndrome, DM type II, HTN, and COPD, admitted for volume overload.   ESRD on HD w/ Volume Overload: 2/2 non-compliance w/ HD. Has not been to HD since his last hospital admission 10 days ago. States he had family issues and was not able to go to HD. Denies suicidal ideations as he had stated on a previous admission. Went for emergent HD from ED, however, nephrology felt he may require further HD prior to discharge. SOB improved per patient.  -Admit to telemetry -HD per nephrology -Monitor daily weights, intake/output -Supplemental O2 -Repeat  renal function panel in AM  Superior Vena Cava Syndrome: According to previous records obtained during last admission, presumed cause of his SVC syndrome was due to upper extremity DVT's, however, an upper extremity US obtained on 8/6 demonstrated no obvious DVT. On 8/6, it was noted that his previous portacath site on the right side, where he had previously received HD, was tender with some drainage. This was removed on  8/8, and over the next two days, the swelling in his face improved somewhat. According to the records, it appears this is longstanding, not a new development.  -Continue to monitor  HTN: Mildly elevated. On Labetalol at home.  -Continue Labetalol  DM type II: Stable -ISS  Bipolar/Depression: During previous admission, patient had expressed the reason for not seeking dialysis was the he did not want to live anymore. Evaluated by psychiatry during previous admission. Not expressing suicidal ideations today.  -Continue to monitor  DVT/PE PPx: Heparin Olmitz  Dispo: Disposition is deferred at this time, awaiting improvement of current medical problems. Anticipated discharge in approximately 1-2 day(s). Patient is homeless. SW consulted. PT evaluation d/t weakness as well.   The patient does not have a current PCP (Provider Not In System) and does not need an [facility] hospital follow-up appointment after discharge.  The patient does have transportation limitations that hinder transportation to clinic appointments.  Signed: Corky Sox, MD 05/13/2015, 6:22 AM

## 2015-05-13 NOTE — Progress Notes (Signed)
Subjective: [name] was seen and examined this AM.  He is sleepy but arousable.   Objective: Vital signs in last 24 hours: Filed Vitals:   05/13/15 0410 05/13/15 0430 05/13/15 0515 05/13/15 0604  BP: 133/90 135/96 144/93 138/104  Pulse: 89 101 93 86  Temp:    97.8 F (36.6 C)  TempSrc:    Oral  Resp: 21 12 26 20   SpO2: 96% 99% 90% 96%   Weight change:   Intake/Output Summary (Last 24 hours) at 05/13/15 1021 Last data filed at 05/13/15 0817  Gross per 24 hour  Intake     60 ml  Output   3000 ml  Net  -2940 ml   General: resting in bed in NAD HEENT: significant facial and RUE edema, exophthalmos,  Cardiac: irregular, no rubs, murmurs or gallops, chest wall vein engorgement Pulm: moving normal volumes of air, no respiratory distress Abd: soft, nontender, nondistended, BS present Ext: warm and well perfused, no pedal edema, LUE AVF + thrill Neuro: alert but drowsy, opens eyes appropriately to voice  Lab Results: Basic Metabolic Panel:  Recent Labs Lab 05/12/15 2339 05/13/15 0755  NA 140 139  K 4.3 4.3  CL 106 102  CO2 15* 22  GLUCOSE 89 79  BUN 91* 48*  CREATININE 14.49* 9.18*  CALCIUM 8.2* 8.6*  PHOS 8.6* 5.3*  5.4*   Liver Function Tests:  Recent Labs Lab 05/12/15 2113 05/12/15 2339 05/13/15 0755  AST 29  --   --   ALT 27  --   --   ALKPHOS 81  --   --   BILITOT 0.6  --   --   PROT 7.4  --   --   ALBUMIN 3.7 3.6 3.4*   CBG:  Recent Labs Lab 05/13/15 0749  GLUCAP 75   Medications: I have reviewed the patient's current medications. Scheduled Meds: . aspirin  81 mg Oral Daily  . heparin  5,000 Units Subcutaneous 3 times per day  . insulin aspart  0-5 Units Subcutaneous QHS  . insulin aspart  0-9 Units Subcutaneous TID WC  . labetalol  400 mg Oral 3 times per day  . sodium chloride  3 mL Intravenous Q12H   Continuous Infusions: none PRN Meds:.sodium chloride, sodium chloride, alteplase, feeding supplement (NEPRO CARB STEADY), heparin,  heparin, ipratropium-albuterol, lidocaine (PF), lidocaine-prilocaine, pentafluoroprop-tetrafluoroeth Assessment/Plan: 64 year old homeless male with hx of SVC syndrome, ESRD on HD.  Volume overload due to ESRD:  Had emergenct HD last night due to non-compliance as an outpatient. Appreciate nephrology recommendations and interventions. - s/p HD last night, will await nephro recs for further treatment  New onset atrial fibrillation:  Captured on tele this AM and confirmed by EKG.  CHADSVASc 4-5.   - START coumadin for A/C - rate is currently controlled  SVC:  Chronic issue.  No acute DVT on duplex last admission (two weeks ago) but, per outside hospital records, he has a hx of upper extremity DVT in the past.  Also with prior HD access (PC removed last admission).   Previously on coumadin but says his doctor stopped it.  No record of bleeding hx.  Starting coumadin as above for A/C in Afib.  DM type 2:  Recent A1c only 5.7.  Not on DM meds as an outpatient. Serum BG 70s-140s. - CBGs ac/hs with SSI-S  HTN:  Slightly elevated.   - monitor with HD - continue home labetalol  Homelessness:  The patient declined shelter info/resources  at last admission.  Will offer again. - CSW has been consulted  Dispo: Disposition is deferred at this time, awaiting improvement of current medical problems.  Anticipated discharge in approximately 1-2 day(s).   The patient does not have a current PCP (Provider Not In System) and does not need an [facility] hospital follow-up appointment after discharge.  The patient does not know have transportation limitations that hinder transportation to clinic appointments.  .Services Needed at time of discharge: Y = Yes, Blank = No PT:   OT:   RN:   Equipment:   Other:       Francesca Oman, DO 05/13/2015, 10:21 AM

## 2015-05-13 NOTE — Progress Notes (Signed)
Patient was seen on dialysis and the procedure was supervised.   BFR 250 Via left BCF  BP is 155/105.  AP -50  VP 130 UF 4.5L  Bath 2k  Patient appears to be tolerating treatment well; still somnolent but arousable.  Will be cutting treatment to 3hrs as there is an emergent dialysis need.  Otelia Santee, MD 05/13/2015, 2:48 AM

## 2015-05-13 NOTE — H&P (Signed)
Internal Medicine Attending Admission Note  I saw and evaluated the patient. I reviewed the resident's note and I agree with the resident's findings and plan as documented in the resident's note.  Assessment & Plan by Problem:  Principal Problem:   Volume overload Active Problems:   HTN (hypertension)   ESRD on hemodialysis   New onset atrial fibrillation   ESRD with Volume Overload: Electrolytes now appropriate and volume status is improved with urgent dialysis last night. Nephrology following. He still appears moderately volume overloaded today. Will need several more HD sessions, PT and OT, and careful plan for how he will receive HD after he leaves this admission.   New Atrial FIbrillation: Likely exacerbated by volume overload and missed HD. Currently rate controlled without an AV nodal blocker. We may start low dose metoprolol if he has RVR with exertion. He would be high risk for CVA because of his chronic illnesses, so I would recommend anticoagulation. INR goal 2-3 and can be followed at his HD center.     Chief Complaint(s): shortness of breath  History - key components related to admission:  64 year old man with ESRD presented to the ED with fatigue, weakness, and diffuse pain associated with missing over a week of HD sessions. He reports not going to HD since his last inpatient admission when he was discharged on 8/10. It is unclear why he didn't go, he was unable to tell me this morning. Denies chest pain. Subjectively short of breath with reduced exertional capacity.   Lab results: Reviewed in Epic  Physical Exam - key components related to admission:  Filed Vitals:   05/13/15 0515 05/13/15 0604 05/13/15 1154 05/13/15 1414  BP: 144/93 138/104 112/65 118/65  Pulse: 93 86 77 86  Temp:  97.8 F (36.6 C) 97.6 F (36.4 C) 97.6 F (36.4 C)  TempSrc:  Oral Oral Oral  Resp: 26 20 16 17   SpO2: 90% 96% 98% 98%    Gen: Chronically iIll appearing man, lying in bed, slow to  arouse, no distress ENT: poor dentition, lots of oral secretions CV: Irregular rhythm, no murmurs Resp: unlabored, course crackles throughout, central secretions with bronchial breathing Abd: soft, non-tender, non-distended Ext: Warm, well perfused Neuro: Slow to arouse, conversational but answers questions slowly, normal strength in upper and lower extremities.

## 2015-05-13 NOTE — Progress Notes (Signed)
We were paged by the nurse to evaluate Noah Lewis for persistent bradyarrythmia and afib with HR ranging from 40s to 90s.  I evaluated the patient at bedside and evaluated the telemetry Patient was soundly sleeping, but he was arousable and alert to name and date. Vital signs were stable, heart rate was 75, rhythm irregular    Have decreased the labetalol to 300 mg from 400 mg.

## 2015-05-14 LAB — CBC
HEMATOCRIT: 32.1 % — AB (ref 39.0–52.0)
Hemoglobin: 10.3 g/dL — ABNORMAL LOW (ref 13.0–17.0)
MCH: 27 pg (ref 26.0–34.0)
MCHC: 32.1 g/dL (ref 30.0–36.0)
MCV: 84.3 fL (ref 78.0–100.0)
Platelets: 195 10*3/uL (ref 150–400)
RBC: 3.81 MIL/uL — ABNORMAL LOW (ref 4.22–5.81)
RDW: 17.8 % — AB (ref 11.5–15.5)
WBC: 5.2 10*3/uL (ref 4.0–10.5)

## 2015-05-14 LAB — RENAL FUNCTION PANEL
Albumin: 3.1 g/dL — ABNORMAL LOW (ref 3.5–5.0)
Anion gap: 15 (ref 5–15)
BUN: 57 mg/dL — AB (ref 6–20)
CALCIUM: 8.2 mg/dL — AB (ref 8.9–10.3)
CO2: 23 mmol/L (ref 22–32)
CREATININE: 10.56 mg/dL — AB (ref 0.61–1.24)
Chloride: 103 mmol/L (ref 101–111)
GFR calc Af Amer: 5 mL/min — ABNORMAL LOW (ref >60)
GFR calc non Af Amer: 5 mL/min — ABNORMAL LOW (ref >60)
GLUCOSE: 152 mg/dL — AB (ref 65–99)
Phosphorus: 7.2 mg/dL — ABNORMAL HIGH (ref 2.5–4.6)
Potassium: 3.7 mmol/L (ref 3.5–5.1)
SODIUM: 141 mmol/L (ref 135–145)

## 2015-05-14 LAB — GLUCOSE, CAPILLARY
Glucose-Capillary: 83 mg/dL (ref 65–99)
Glucose-Capillary: 110 mg/dL — ABNORMAL HIGH (ref 65–99)
Glucose-Capillary: 97 mg/dL (ref 65–99)

## 2015-05-14 LAB — PROTIME-INR
INR: 1.24 (ref 0.00–1.49)
Prothrombin Time: 15.7 seconds — ABNORMAL HIGH (ref 11.6–15.2)

## 2015-05-14 MED ORDER — LABETALOL HCL 100 MG PO TABS
100.0000 mg | ORAL_TABLET | Freq: Three times a day (TID) | ORAL | Status: DC
  Administered 2015-05-14 – 2015-05-18 (×10): 100 mg via ORAL
  Filled 2015-05-14 (×10): qty 1

## 2015-05-14 MED ORDER — WARFARIN SODIUM 7.5 MG PO TABS
7.5000 mg | ORAL_TABLET | Freq: Once | ORAL | Status: AC
  Administered 2015-05-14: 7.5 mg via ORAL
  Filled 2015-05-14: qty 1

## 2015-05-14 NOTE — Evaluation (Signed)
Physical Therapy Evaluation Patient Details Name: Noah Lewis MRN: [id number] DOB: [birth year] Today's Date: 05/14/2015   History of Present Illness    64 year old man with ESRD presented to the ED with fatigue, weakness, and diffuse pain associated with missing over a week of HD sessions. He reports not going to HD since his last inpatient admission when he was discharged on 8/10.   Clinical Impression  Pt presents with mild to moderate limitations to functional mobility related to acute illness and chronic back pain, complicated by tenuous medical self-management of chronic health conditions.  Pt able to walk short distance with cane but requires standing rest break due to back pain.  History of falls (per chart) combined with chronic pain, likely poor eyesight and limited muscular endurance place pt at high fall risk.  Pt became emotional during session, describing accident as boy that left him with chronic pain and limited his ability to participate in sports; pt tearful and seems quite depressed.  When attempting to engage pt in moment and assess interventions to manage pain currently, pt disengages and looks away.  Case is further complicated by unclear home/living situation as pt appears to be homeless or at least with limited socioeconomic resources.    Recommend:  1.  Walk short distance multiple times per day with nursing using cane;  2.  c/s CSW for assist with potential SNF placement;  3.  consider psych consult, counseling and/or medical assessment of potential depression;  4.  consider application of ice and/or moist heat to low back to assist with pain management as ancillary to medical interventions.  PT will initiate services in acute setting to address balance and gait deficits with hope to facilitate transition to SNF and eventually back to community.  See below for details of exam findings.     Follow Up Recommendations SNF;Supervision/Assistance - 24 hour (balance  retraining, med management)    Equipment Recommendations  None recommended by PT    Recommendations for Other Services       Precautions / Restrictions Precautions Precautions: Fall Precaution Comments: camera room, chair/bed alarm, uses cane limited by low back pain      Mobility  Bed Mobility Overal bed mobility: Modified Independent             General bed mobility comments: performs unassisted or uncued except to encourage and stay on task  Transfers Overall transfer level: Needs assistance Equipment used: Straight cane Transfers: Sit to/from Stand Sit to Stand: Supervision         General transfer comment: standby for safety given documented history of falls, poor eyesight as suggested by red/irritable eyes and lowered lids; pt performs with mild effort and no obvious instabilty  Ambulation/Gait Ambulation/Gait assistance: Min assist Ambulation Distance (Feet): 125 Feet Assistive device: Straight cane Gait Pattern/deviations: Step-through pattern;Antalgic Gait velocity: unmeasured, likely decr on average Gait velocity interpretation: Below normal speed for age/gender General Gait Details: gradually increases speed over time, able to maintain pace and steady gait but limited by back pain, requires standing rest with support at ~80 feet, coaxing to room  Stairs            Wheelchair Mobility    Modified Rankin (Stroke Patients Only)       Balance Overall balance assessment: Needs assistance;History of Falls Sitting-balance support: No upper extremity supported;Feet supported Sitting balance-Leahy Scale: Good     Standing balance support: Single extremity supported;During functional activity Standing balance-Leahy Scale: Fair Standing balance comment: requires cane  for dyamic/walking activities; no overt LOB or unsteadiness noted but caution dictates close supervision to hands-on assist for maximal safety Single Leg Stance - Right Leg: 0 Single  Leg Stance - Left Leg: 0 Tandem Stance - Right Leg: 0 Tandem Stance - Left Leg: 0 Rhomberg - Eyes Opened: 15                   Pertinent Vitals/Pain Pain Assessment: 0-10 Pain Score: 7  Pain Location: back with incr activity; limits walking distance Pain Intervention(s): Limited activity within patient's tolerance;Monitored during session;Repositioned (offered heat/ice, pt unable to verbalize consent)    Home Living Family/patient expects to be discharged to:: Unsure                 Additional Comments: Pt may be experiencing homelessness, which complicates things    Prior Function Level of Independence: Independent with assistive device(s)         Comments: uses cane at baseline, refuses RW when offered     Hand Dominance        Extremity/Trunk Assessment   Upper Extremity Assessment: Overall WFL for tasks assessed (very basic functional mobility)           Lower Extremity Assessment: Overall WFL for tasks assessed (very basic functional mobility)         Communication   Communication: HOH  Cognition Arousal/Alertness: Awake/alert (sleepy, but awakens and engages) Behavior During Therapy: Flat affect Overall Cognitive Status: No family/caregiver present to determine baseline cognitive functioning                      General Comments General comments (skin integrity, edema, etc.): unkepmt appearance; pt became emotional during session, describing accident as boy that left him with chronic back pain and inability to play sports for which pt reports he had scholarship offers; pt appears depressed and would benefit from couselling/medical assessmtn.  When pt asked about current situation or offered methods to manage back pain, difficult to assess whether pt heard, understood or chose to ignore question as just nods and looks away    Exercises        Assessment/Plan    PT Assessment Patient needs continued PT services  PT Diagnosis  Difficulty walking   PT Problem List Pain;Cardiopulmonary status limiting activity;Decreased cognition;Decreased mobility;Decreased balance  PT Treatment Interventions DME instruction;Gait training;Balance training;Cognitive remediation;Patient/family education;Modalities   PT Goals (Current goals can be found in the Care Plan section) Acute Rehab PT Goals Patient Stated Goal: did not state PT Goal Formulation: Patient unable to participate in goal setting Time For Goal Achievement: 05/28/15 Potential to Achieve Goals: Fair    Frequency Min 2X/week   Barriers to discharge Decreased caregiver support (unclear home environment or support.  Homeless?)      Co-evaluation               End of Session Equipment Utilized During Treatment: Gait belt Activity Tolerance: Patient limited by pain Patient left: in chair;with call bell/phone within reach;with chair alarm set (camera room) Nurse Communication: Mobility status         Time: 1021-1105 PT Time Calculation (min) (ACUTE ONLY): 44 min   Charges:   PT Evaluation $Initial PT Evaluation Tier I: 1 Procedure PT Treatments $Gait Training: 8-22 mins $Therapeutic Activity: 8-22 mins   PT G Codes:        Herbie Drape 05/14/2015, 12:00 PM

## 2015-05-14 NOTE — Progress Notes (Signed)
Had 3000cc off with hemodialysis yesterday. 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 He still has significant volume overload. Plan for HD in AM. Destina Mantei C

## 2015-05-14 NOTE — Progress Notes (Signed)
Subjective:  Noah Lewis was seen and examined this morning. When I walked in, he was talking to someone over a cell phone. When we attempted to discuss what led him back to the hospital, he kept repeating "Alright" or "Okay doc" regardless of the questions asked. All other responses were appropriate, but it seemed he was not interested in discussing what had happened and did not express a willingness to discuss a plan for obtaining hemodialysis after he leaves the hospital. Otherwise, he was only complaining of a persistent back pain, which is stable from the last admission earlier this month. He denies any shortness of breath this morning.  Nursing notes report bradycardia overnight to 39, but he was asymptomatic at the time.   Objective: Vital signs in last 24 hours: Filed Vitals:   05/13/15 1749 05/13/15 2023 05/14/15 0200 05/14/15 0547  BP: 108/78 127/86 117/77 120/84  Pulse: 85 76 78 82  Temp:  98.7 F (37.1 C) 97.6 F (36.4 C) 97.7 F (36.5 C)  TempSrc:  Oral Oral Oral  Resp: 18 22 18 18   Weight:    175 lb 7.8 oz (79.6 kg)  SpO2: 95% 99% 100% 96%   Weight change:   Intake/Output Summary (Last 24 hours) at 05/14/15 0831 Last data filed at 05/13/15 1354  Gross per 24 hour  Intake    130 ml  Output    300 ml  Net   -170 ml   Physical Exam:  General:  Lying on side in no acute distress HEENT:  Significant facial swelling and upper extremity edema Cardiac:  Irregularly irregular rhythm with normal rate. No murmurs. L AVF in place with palpable thrill Pulmonary:  Moving normal volumes of air. No wheezes or crackles heard Abdominal:  +BS. Soft, nontender, nondistended Neuro:  Alert, non-drowsy, and responding appropriately to most questions Psych: When asked about his mood, he just kept repeating "Okay Doc"  Lab Results: Basic Metabolic Panel:  Recent Labs Lab 05/12/15 2339 05/13/15 0755  NA 140 139  K 4.3 4.3  CL 106 102  CO2 15* 22  GLUCOSE 89 79  BUN 91* 48*    CREATININE 14.49* 9.18*  CALCIUM 8.2* 8.6*  PHOS 8.6* 5.3*  5.4*   Liver Function Tests:  Recent Labs Lab 05/12/15 2113 05/12/15 2339 05/13/15 0755  AST 29  --   --   ALT 27  --   --   ALKPHOS 81  --   --   BILITOT 0.6  --   --   PROT 7.4  --   --   ALBUMIN 3.7 3.6 3.4*   CBC:  Recent Labs Lab 05/08/15 1650 05/12/15 2113 05/12/15 2127 05/12/15 2339  WBC 6.5 6.7  --  5.9  NEUTROABS 4.8  --   --   --   HGB 10.8* 10.4* 11.6* 10.3*  HCT 32.9* 32.1* 34.0* 31.4*  MCV 83.5 85.1  --  84.4  PLT 205 205  --  217   CBG:  Recent Labs Lab 05/13/15 0749 05/13/15 1152 05/14/15 0758  GLUCAP 75 85 83   Coagulation:  Recent Labs Lab 05/13/15 1150 05/14/15 0533  LABPROT 16.9* 15.7*  INR 1.36 1.24   Studies/Results: Dg Chest 2 View  05/12/2015   CLINICAL DATA:  Patient with worsening shortness of breath. No fever.  EXAM: CHEST  2 VIEW  COMPARISON:  Chest radiograph 04/28/2015  FINDINGS: Stable cardiomegaly. Stent graft material projects over the left upper hemi thorax. Pulmonary vascular redistribution with mild  interstitial bilateral pulmonary opacities. Mid thoracic spine degenerative changes.  IMPRESSION: Cardiomegaly with mild interstitial pulmonary edema.   Electronically Signed   By: Lovey Newcomer M.D.   On: 05/12/2015 21:20   Medications: I have reviewed the patient's current medications. Scheduled Meds: . aspirin  81 mg Oral Daily  . heparin  5,000 Units Subcutaneous 3 times per day  . insulin aspart  0-5 Units Subcutaneous QHS  . insulin aspart  0-9 Units Subcutaneous TID WC  . labetalol  200 mg Oral 3 times per day  . sodium chloride  3 mL Intravenous Q12H  . warfarin   Does not apply Once  . Warfarin - Pharmacist Dosing Inpatient   Does not apply q1800   Continuous Infusions:  PRN Meds:.sodium chloride, sodium chloride, alteplase, feeding supplement (NEPRO CARB STEADY), heparin, heparin, ipratropium-albuterol, lidocaine (PF), lidocaine-prilocaine,  pentafluoroprop-tetrafluoroeth Assessment/Plan:  Volume Overload 2/2 ESRD and Hemodialysis Non-Adherence: He denies any shortness of breath this morning and is in no acute distress. He has only received a partial (3 hr session) of HD since admission. Creatinine improved to 9 on 8/20 from 14s on admission.  - Mental status and SOB have improved, but he may need a HD today, will appreciate nephrology recs - He needs a plan to maintain access to HD when he leaves the hospital. Given his social situation, this may include moving his HD care to a center in [place]. Will be having discussions with the patient to discuss   New Onset Atrial Fibrillation: Likely secondary to ESRD. Started on coumadin instead of NOACs in setting of ESRD. Bradycardia on 8/20 in setting of afb with no other abnormalities on telemetry.. - Decreased labetolol form 200 mg to 100 mg TID for HTN. Will consider transitioning to metoprolol.  Superior Vena Cava Syndrome: Ongoing issue. Overall is improved from his first admission when his PC was removed. Previous records from HD centers suggest this is due to ongoing upper extremity DVTs, although last admission, upper extremity US was unremarkable.  Type 2 Diabetes: No medications. Recent A1c of 5.7 - CBGs ac/hs with SSI-S  HTN: Labetolol may have contributed to bradycardia to 39 in setting of afib - 100 mg TID  Homelessness:  Significant barrier to care. Previously refused shelter services. - CSW consulted  Dispo: Disposition is deferred at this time, awaiting improvement of current medical problems.  Anticipated discharge in approximately 2-3 day(s).   The patient does not have a current PCP (Provider Not In System) and does not need an [facility] hospital follow-up appointment after discharge.  The patient does have transportation limitations that hinder transportation to clinic appointments. He will need access to a HD center on discharge.  .Services Needed at time of  discharge: Y = Yes, Blank = No PT:   OT:   RN:   Equipment:   Other:     LOS: 1 day   Liberty Handy, MD 05/14/2015, 8:31 AM

## 2015-05-14 NOTE — Progress Notes (Signed)
ANTICOAGULATION CONSULT NOTE - Initial Consult  Pharmacy Consult for warfarin Indication: atrial fibrillation  Allergies  Allergen Reactions  . Penicillins Other (See Comments)    unsure    Patient Measurements: Weight: 79.7 kg Height: 5\' 7"   Vital Signs: Temp: 97.7 F (36.5 C) (08/21 0547) Temp Source: Oral (08/21 0547) BP: 120/84 mmHg (08/21 0547) Pulse Rate: 82 (08/21 0547)  Labs:  Recent Labs  05/12/15 2113 05/12/15 2127 05/12/15 2339 05/13/15 0755 05/13/15 1150 05/14/15 0533 05/14/15 1032  HGB 10.4* 11.6* 10.3*  --   --   --   --   HCT 32.1* 34.0* 31.4*  --   --   --   --   PLT 205  --  217  --   --   --   --   LABPROT  --   --   --   --  16.9* 15.7*  --   INR  --   --   --   --  1.36 1.24  --   CREATININE 14.78* 14.00* 14.49* 9.18*  --   --  10.56*    Estimated Creatinine Clearance: 7.2 mL/min (by C-G formula based on Cr of 10.56).   Medical History: Past Medical History  Diagnosis Date  . Cancer   . Arthritis   . Renal disorder   . Hypertension   . Diabetes mellitus without complication   . Asthma   . COPD (chronic obstructive pulmonary disease)   . Coronary artery disease     Assessment: 63yoM admitted 05/12/2015 presents with c/o worsening SOB. Pt had not been to HD since last admission on 05/03/15. He also c/o weakness, fatigue, and worsening back pain. Went for emergent HD d/t significant volume overload.  PMH: ESRD, SVC syndrome, DM2, HTN, COPD  Mr. Pak was found to be in Afib and pharmacy is consulted to dose warfarin. Baseline INR = 1.36, H/H stable, Plts stable, no s/sx of bleeding noted. Warfarin predictor points = 7  INR 1.24, No new CBC today, No s/sx of bleeding noted.  Goal of Therapy:  INR 2-3 Monitor platelets by anticoagulation protocol: Yes   Plan:  - Will give warfarin 7.5 mg x1 tonight - Monitor daily INR, CBC, s/sx of bleeding  Dimitri Ped, PharmD. Clinical Pharmacist Resident Pager: [phone]

## 2015-05-15 DIAGNOSIS — I4891 Unspecified atrial fibrillation: Secondary | ICD-10-CM

## 2015-05-15 DIAGNOSIS — Z9115 Patient's noncompliance with renal dialysis: Secondary | ICD-10-CM

## 2015-05-15 DIAGNOSIS — Z590 Homelessness: Secondary | ICD-10-CM

## 2015-05-15 DIAGNOSIS — I120 Hypertensive chronic kidney disease with stage 5 chronic kidney disease or end stage renal disease: Secondary | ICD-10-CM

## 2015-05-15 DIAGNOSIS — E1122 Type 2 diabetes mellitus with diabetic chronic kidney disease: Secondary | ICD-10-CM

## 2015-05-15 DIAGNOSIS — I871 Compression of vein: Secondary | ICD-10-CM

## 2015-05-15 DIAGNOSIS — E8779 Other fluid overload: Secondary | ICD-10-CM

## 2015-05-15 DIAGNOSIS — N186 End stage renal disease: Secondary | ICD-10-CM

## 2015-05-15 LAB — GLUCOSE, CAPILLARY
GLUCOSE-CAPILLARY: 83 mg/dL (ref 65–99)
GLUCOSE-CAPILLARY: 90 mg/dL (ref 65–99)

## 2015-05-15 LAB — RENAL FUNCTION PANEL
ALBUMIN: 3.2 g/dL — AB (ref 3.5–5.0)
ANION GAP: 15 (ref 5–15)
BUN: 68 mg/dL — AB (ref 6–20)
CO2: 23 mmol/L (ref 22–32)
Calcium: 8 mg/dL — ABNORMAL LOW (ref 8.9–10.3)
Chloride: 105 mmol/L (ref 101–111)
Creatinine, Ser: 11.91 mg/dL — ABNORMAL HIGH (ref 0.61–1.24)
GFR, EST AFRICAN AMERICAN: 5 mL/min — AB (ref >60)
GFR, EST NON AFRICAN AMERICAN: 4 mL/min — AB (ref >60)
Glucose, Bld: 118 mg/dL — ABNORMAL HIGH (ref 65–99)
PHOSPHORUS: 7.6 mg/dL — AB (ref 2.5–4.6)
POTASSIUM: 3.6 mmol/L (ref 3.5–5.1)
Sodium: 143 mmol/L (ref 135–145)

## 2015-05-15 LAB — PROTIME-INR
INR: 1.61 — ABNORMAL HIGH (ref 0.00–1.49)
Prothrombin Time: 19.2 seconds — ABNORMAL HIGH (ref 11.6–15.2)

## 2015-05-15 MED ORDER — CALCIUM ACETATE (PHOS BINDER) 667 MG PO CAPS
1334.0000 mg | ORAL_CAPSULE | Freq: Three times a day (TID) | ORAL | Status: DC
  Administered 2015-05-15 – 2015-05-22 (×19): 1334 mg via ORAL
  Filled 2015-05-15 (×23): qty 2

## 2015-05-15 MED ORDER — SODIUM CHLORIDE 0.9 % IV SOLN: 100.0000 mL | INTRAVENOUS | Status: DC | PRN

## 2015-05-15 MED ORDER — HEPARIN SODIUM (PORCINE) 1000 UNIT/ML DIALYSIS
20.0000 [IU]/kg | INTRAMUSCULAR | Status: DC | PRN
  Filled 2015-05-15: qty 2

## 2015-05-15 MED ORDER — HEPARIN SODIUM (PORCINE) 1000 UNIT/ML DIALYSIS
1000.0000 [IU] | INTRAMUSCULAR | Status: DC | PRN
  Filled 2015-05-15: qty 1

## 2015-05-15 MED ORDER — LIDOCAINE-PRILOCAINE 2.5-2.5 % EX CREA
1.0000 "application " | TOPICAL_CREAM | CUTANEOUS | Status: DC | PRN
  Filled 2015-05-15: qty 5

## 2015-05-15 MED ORDER — NEPRO/CARBSTEADY PO LIQD: 237.0000 mL | ORAL | Status: DC | PRN

## 2015-05-15 MED ORDER — LIDOCAINE HCL (PF) 1 % IJ SOLN
5.0000 mL | INTRAMUSCULAR | Status: DC | PRN
  Filled 2015-05-15: qty 5

## 2015-05-15 MED ORDER — WARFARIN SODIUM 2.5 MG PO TABS
2.5000 mg | ORAL_TABLET | Freq: Once | ORAL | Status: AC
  Administered 2015-05-15: 2.5 mg via ORAL
  Filled 2015-05-15: qty 1

## 2015-05-15 MED ORDER — ALTEPLASE 2 MG IJ SOLR
2.0000 mg | Freq: Once | INTRAMUSCULAR | Status: DC | PRN
  Filled 2015-05-15: qty 2

## 2015-05-15 MED ORDER — PENTAFLUOROPROP-TETRAFLUOROETH EX AERO: 1.0000 "application " | INHALATION_SPRAY | CUTANEOUS | Status: DC | PRN

## 2015-05-15 NOTE — Progress Notes (Addendum)
ANTICOAGULATION CONSULT NOTE - Follow- Up  Pharmacy Consult for warfarin Indication: atrial fibrillation  Allergies  Allergen Reactions  . Penicillins Other (See Comments)    unsure    Patient Measurements: Weight: 79.7 kg Height: 5\' 7"   Vital Signs: Temp: 97.7 F (36.5 C) (08/22 0615) Temp Source: Oral (08/22 0615) BP: 150/89 mmHg (08/22 0631) Pulse Rate: 87 (08/22 0631)  Labs:  Recent Labs  05/12/15 2113 05/12/15 2127 05/12/15 2339 05/13/15 0755 05/13/15 1150 05/14/15 0533 05/14/15 1032 05/14/15 1629  HGB 10.4* 11.6* 10.3*  --   --   --   --  10.3*  HCT 32.1* 34.0* 31.4*  --   --   --   --  32.1*  PLT 205  --  217  --   --   --   --  195  LABPROT  --   --   --   --  16.9* 15.7*  --   --   INR  --   --   --   --  1.36 1.24  --   --   CREATININE 14.78* 14.00* 14.49* 9.18*  --   --  10.56*  --     Estimated Creatinine Clearance: 7.3 mL/min (by C-G formula based on Cr of 10.56).   Medical History: Past Medical History  Diagnosis Date  . Cancer   . Arthritis   . Renal disorder   . Hypertension   . Diabetes mellitus without complication   . Asthma   . COPD (chronic obstructive pulmonary disease)   . Coronary artery disease     Assessment: 64 yo M admitted 05/12/2015 presents with c/o worsening SOB. Pt had not been to HD since last admission on 05/03/15. He also c/o weakness, fatigue, and worsening back pain. Went for emergent HD d/t significant volume overload.  PMH: ESRD, SVC syndrome, DM2, HTN, COPD  Mr. Haverstock was found to be in Afib and pharmacy is consulted to dose warfarin. Baseline INR = 1.36, H/H stable, Plts stable, no s/sx of bleeding noted. Warfarin predictor points = 7  Pt has refused extra HD session today and lab draws.  We do not have an INR to base today's dosing off of.  Pt has received Coumadin 7.5mg  x 2 doses.  Will dose conservatively tonight not knowing INR trend.  Goal of Therapy:  INR 2-3 Monitor platelets by anticoagulation  protocol: Yes   Plan:  - Will give warfarin 2.5 mg x1 tonight - Monitor daily INR, CBC, s/sx of bleeding  [employer], Pharm.D., BCPS Clinical Pharmacist Pager [phone] 05/15/2015 1:42 PM   Addendum: Patient now agreeable to lab draws and HD.  INR  Has increased from 1.24 >> 1.61 after 2 x 7.5mg  doses.  Will continue with 2.5mg  dose as ordered tonight.  [employer], Pharm.D., BCPS Clinical Pharmacist Pager [phone] 05/15/2015 4:05 PM

## 2015-05-15 NOTE — Clinical Social Work Note (Addendum)
Clinical Social Work Assessment  Patient Details  Name: Noah Lewis MRN: [id number] Date of Birth: 18-Jun-1951  Date of referral:  05/15/15               Reason for consult:  Facility Placement, Discharge Planning                Permission sought to share information with:  Family Supports, Chartered certified accountant granted to share information::  Yes, Verbal Permission Granted  Name::     [name]  Agency::  SNFs  Relationship::     Contact Information:     Housing/Transportation Living arrangements for the past 2 months:  Homeless Source of Information:  Patient, Parent Patient Interpreter Needed:  None (CSW asked if patient needs a sign language interpreter, the patient states he can hear CSW but CSW has to speak very loudly.) Criminal Activity/Legal Involvement Pertinent to Current Situation/Hospitalization:  No - Comment as needed Significant Relationships:  Parents Lives with:  Self Do you feel safe going back to the place where you live?  No Need for family participation in patient care:  No (Coment)  Care giving concerns:  Patient reports concerns about being homeless and having nowhere to go at discharge.   Social Worker assessment / plan:  CSW met with patient at bedside to complete assessment. Patient presents with issues of non-compliance and homelessness. PT has recommended SNF for the patient. CSW met with the patient and discussed recommendation for placement. Patient states he is agreeable to this at discharge. CSW explained that patient's HD will likely need to be switched to a facility close to the accepting facility, patient is also agreeable to this. The patient reports that he used to live in [facility] but recently became homeless. Per RN, patient has refused to go to HD this morning. CSW suspects this may be a from a lack of understanding why he needs dialysis at this time. The patient states that he is not going today because his schedule  is for TTS, and with his hearing difficulties, it's quite possible he doesn't understand the reasoning behind sending him to dialysis today. The patient was very pleasant with CSW. CSW explained SNF search/placement process and answered the patient's questions. Patient understands that if he is placed long term at a SNF, his disability check of $733/mo. Will go to the accepting facility. CSW also spoke with the patient's mother [name]. [name] has been informed of the SNF search/placement process and will be included in process. CSW will follow up with bed offers.   Employment status:  Disabled (Comment on whether or not currently receiving Disability) Insurance information:  Medicaid In [place] PT Recommendations:  [place] / Referral to community resources:  [place]  Patient/Family's Response to care:  Per staff patient is refusing care. If the patient is truly refusing and this is not a result of him misunderstanding the situation due to his hearing, a capacity evaluation may be needed. Any accepting SNF will have questions about whether or not the patient can refuse medications or HD treatments, if he's truly refusing.  Patient/Family's Understanding of and Emotional Response to Diagnosis, Current Treatment, and Prognosis:  Patient appears to have fair understanding reason for his admission. Patient also understands what his post DC needs will be.  Emotional Assessment Appearance:  Appears older than stated age Attitude/Demeanor/Rapport:  Other (Patient was appropriate with CSW) Affect (typically observed):  Calm, Quiet Orientation:  Oriented to Self, Oriented to  Place, Oriented to  Time Alcohol / Substance use:    Psych involvement (Current and /or in the community):     Discharge Needs  Concerns to be addressed:  Patient refuses services, Homelessness, Discharge Planning Concerns, Compliance Issues Concerns Readmission within the last 30 days:   Yes Current discharge risk:  Physical Impairment, Chronically ill, Inadequate Financial Supports, Lack of support system, Homeless Barriers to Discharge:  Continued Medical Work up   [employer] MSW, [place], [place], [phone]

## 2015-05-15 NOTE — Progress Notes (Signed)
Subjective:  [name] was seen and examined this morning. He reported that he was able to walk the halls without being short of breath. He said, "Okay doc, I need to tell you something." He said that after talking with his mom, he said he'd be agreeable to going to a facility to assist him with his general medical care and to ensure he has consistent access to dialysis. He had no complaints today other than his longstanding back pain.   At the time I was unaware he had refused HD that morning. I went back that afternoon to discuss the importance of receiving HD, but he had already been taken to the dialysis unit.  Objective: Vital signs in last 24 hours: Filed Vitals:   05/15/15 1323 05/15/15 1330 05/15/15 1400 05/15/15 1430  BP: 146/101 138/103 145/99 153/98  Pulse: 76 82 86 68  Temp:      TempSrc:      Resp: 18 18 17 18   Weight:      SpO2:       Weight change: 1 lb 8.7 oz (0.7 kg)  Intake/Output Summary (Last 24 hours) at 05/15/15 1437 Last data filed at 05/15/15 1320  Gross per 24 hour  Intake    700 ml  Output     80 ml  Net    620 ml   Physical Exam:  General:  Lying on side in no acute distress HEENT:  Significant facial swelling and upper extremity edema Cardiac:  Irregularly irregular rhythm with normal rate. No murmurs. L AVF in place with palpable thrill Pulmonary:  Moving normal volumes of air. No wheezes or crackles heard Abdominal:  +BS. Soft, nontender, nondistended Neuro:  Alert, non-drowsy, and responding appropriately to most questions Psych: When asked about his mood, he just kept repeating "Okay Doc"  Lab Results: Basic Metabolic Panel:  Recent Labs Lab 05/14/15 1032 05/15/15 0900  NA 141 143  K 3.7 3.6  CL 103 105  CO2 23 23  GLUCOSE 152* 118*  BUN 57* 68*  CREATININE 10.56* 11.91*  CALCIUM 8.2* 8.0*  PHOS 7.2* 7.6*   Liver Function Tests:  Recent Labs Lab 05/12/15 2113  05/14/15 1032 05/15/15 0900  AST 29  --   --   --   ALT 27   --   --   --   ALKPHOS 81  --   --   --   BILITOT 0.6  --   --   --   PROT 7.4  --   --   --   ALBUMIN 3.7  < > 3.1* 3.2*  < > = values in this interval not displayed. CBC:  Recent Labs Lab 05/08/15 1650  05/12/15 2339 05/14/15 1629  WBC 6.5  < > 5.9 5.2  NEUTROABS 4.8  --   --   --   HGB 10.8*  < > 10.3* 10.3*  HCT 32.9*  < > 31.4* 32.1*  MCV 83.5  < > 84.4 84.3  PLT 205  < > 217 195  < > = values in this interval not displayed. CBG:  Recent Labs Lab 05/13/15 0749 05/13/15 1152 05/14/15 0758 05/14/15 1203 05/14/15 1654 05/15/15 0745  GLUCAP 75 85 83 110* 97 83   Coagulation:  Recent Labs Lab 05/13/15 1150 05/14/15 0533 05/15/15 0900  LABPROT 16.9* 15.7* 19.2*  INR 1.36 1.24 1.61*   Studies/Results: No results found. Medications: I have reviewed the patient's current medications. Scheduled Meds: . aspirin  81 mg  Oral Daily  . calcium acetate  1,334 mg Oral TID WC  . heparin  5,000 Units Subcutaneous 3 times per day  . insulin aspart  0-5 Units Subcutaneous QHS  . insulin aspart  0-9 Units Subcutaneous TID WC  . labetalol  100 mg Oral 3 times per day  . sodium chloride  3 mL Intravenous Q12H  . warfarin  2.5 mg Oral ONCE-1800  . warfarin   Does not apply Once  . Warfarin - Pharmacist Dosing Inpatient   Does not apply q1800   Continuous Infusions:  PRN Meds:.sodium chloride, sodium chloride, sodium chloride, sodium chloride, alteplase, alteplase, feeding supplement (NEPRO CARB STEADY), feeding supplement (NEPRO CARB STEADY), heparin, heparin, heparin, heparin, ipratropium-albuterol, lidocaine (PF), lidocaine (PF), lidocaine-prilocaine, lidocaine-prilocaine, pentafluoroprop-tetrafluoroeth, pentafluoroprop-tetrafluoroeth Assessment/Plan:  Volume Overload 2/2 ESRD and Hemodialysis Non-Adherence: He denies any shortness of breath this morning and is in no acute distress. He is getting his HD this afternoon - He needs a plan to maintain access to HD when he  leaves the hospital. As of now, a SNF in close proximity to a dialysis center would be his best option. We appreciate the efforts of Case Manager, Carles Collet, and CSW Liz Beach  New Onset Atrial Fibrillation: Likely secondary to ESRD. Started on coumadin instead of NOACs in setting of ESRD. Warfarin would be a good option if he is discharged to SNF and will have consistent access to HD with consistent INR checks. However, if he is discharged to the street, this would be a poor option and may benefit from ASA as an alternative. - On Labetolol 100 mg TID for HTN. Will consider transitioning to metoprolol.  Superior Vena Cava Syndrome: Ongoing issue. Overall is improved from his first admission when his PC was removed. Previous records from HD centers suggest this is due to ongoing upper extremity DVTs, although last admission, upper extremity US was unremarkable.  Type 2 Diabetes: No medications. Recent A1c of 5.7 - CBGs ac/hs with SSI-S  HTN: Labetolol may have contributed to bradycardia to 39 in setting of afib on 8/20. Dose was decreased to current dose. - 100 mg TID  Homelessness:  Significant barrier to care. Previously refused shelter services. - CSW consulted  Dispo: Disposition is deferred at this time, awaiting improvement of current medical problems.  Anticipated discharge in approximately 2-3 day(s).   The patient does not have a current PCP (Provider Not In System) and does not need an [facility] hospital follow-up appointment after discharge. He needs to establish care an [facility]  The patient does have transportation limitations that hinder transportation to clinic appointments. He will need access to a HD center on discharge.  .Services Needed at time of discharge: Y = Yes, Blank = No PT:   OT:   RN:   Equipment:   Other:     LOS: 2 days   Liberty Handy, MD 05/15/2015, 2:37 PM

## 2015-05-15 NOTE — Clinical Social Work Placement (Signed)
   CLINICAL SOCIAL WORK PLACEMENT  NOTE  Date:  05/15/2015  Patient Details  Name: Noah Lewis MRN: [id number] Date of Birth: 06/08/1951  Clinical Social Work is seeking post-discharge placement for this patient at the [place] level of care (*CSW will initial, date and re-position this form in  chart as items are completed):  Yes   Patient/family provided with [place] Work Department's list of facilities offering this level of care within the geographic area requested by the patient (or if unable, by the patient's family).  Yes   Patient/family informed of their freedom to choose among providers that offer the needed level of care, that participate in Medicare, Medicaid or managed care program needed by the patient, have an available bed and are willing to accept the patient.  Yes   Patient/family informed of [place]'s ownership interest in [facility] and [facility], as well as of the fact that they are under no obligation to receive care at these facilities.  PASRR submitted to EDS on       PASRR number received on       Existing PASRR number confirmed on 05/15/15     FL2 transmitted to all facilities in geographic area requested by pt/family on 05/15/15     FL2 transmitted to all facilities within larger geographic area on       Patient informed that his/her managed care company has contracts with or will negotiate with certain facilities, including the following:            Patient/family informed of bed offers received.  Patient chooses bed at       Physician recommends and patient chooses bed at      Patient to be transferred to   on  .  Patient to be transferred to facility by       Patient family notified on   of transfer.  Name of family member notified:        PHYSICIAN       Additional Comment:    _______________________________________________ Liz Beach MSW, [place], [place], [phone]

## 2015-05-15 NOTE — Progress Notes (Signed)
Pt was asked twice to go to HDU today.  Pt refused per HDU RN.  Will continue to monitor.

## 2015-05-15 NOTE — Progress Notes (Signed)
Patient ID: Noah Lewis, male   DOB: 07-21-1951, 64 y.o.   MRN: [id number]  [facility] Progress Note    Subjective:   Reports trouble breathing   Objective:   BP 150/89 mmHg  Pulse 87  Temp(Src) 97.7 F (36.5 C) (Oral)  Resp 20  Wt 80.3 kg (177 lb 0.5 oz)  SpO2 100%  Intake/Output: I/O last 3 completed shifts: In: 560 [P.O.:560] Out: 280 [Urine:280]   Intake/Output this shift:    Weight change: 0.7 kg (1 lb 8.7 oz)  Physical Exam: Gen:WD AAM with facial edema CVS: no rub Resp:bibasilar crackles and scattered rhonchi Abd:+BS, soft, NT Ext:+ upper ext edema, LUE AVF +T/B, no lower ext edema  Labs: BMET  Recent Labs Lab 05/08/15 1650 05/12/15 2113 05/12/15 2127 05/12/15 2339 05/13/15 0755 05/14/15 1032  NA 137 141 140 140 139 141  K 4.5 4.1 4.3 4.3 4.3 3.7  CL 101 105 107 106 102 103  CO2 18* 17*  --  15* 22 23  GLUCOSE 76 142* 141* 89 79 152*  BUN 89* 90* 96* 91* 48* 57*  CREATININE 12.59* 14.78* 14.00* 14.49* 9.18* 10.56*  ALBUMIN  --  3.7  --  3.6 3.4* 3.1*  CALCIUM 8.8* 8.2*  --  8.2* 8.6* 8.2*  PHOS  --   --   --  8.6* 5.3*  5.4* 7.2*   CBC  Recent Labs Lab 05/08/15 1650 05/12/15 2113 05/12/15 2127 05/12/15 2339 05/14/15 1629  WBC 6.5 6.7  --  5.9 5.2  NEUTROABS 4.8  --   --   --   --   HGB 10.8* 10.4* 11.6* 10.3* 10.3*  HCT 32.9* 32.1* 34.0* 31.4* 32.1*  MCV 83.5 85.1  --  84.4 84.3  PLT 205 205  --  217 195    @IMGRELPRIORS @ Medications:    . aspirin  81 mg Oral Daily  . heparin  5,000 Units Subcutaneous 3 times per day  . insulin aspart  0-5 Units Subcutaneous QHS  . insulin aspart  0-9 Units Subcutaneous TID WC  . labetalol  100 mg Oral 3 times per day  . sodium chloride  3 mL Intravenous Q12H  . warfarin   Does not apply Once  . Warfarin - Pharmacist Dosing Inpatient   Does not apply [id number]    Assessment/ Plan:   1. SOB- due to noncompliance with hemodialysis and volume overload.  Improved with HD however  but he refused an extra treatment earlier today to help with his volume status.  He is complaining of SOB and is now willing to have dialysis. 2. ESRD: nonadherent with HD at his home unit in [facility] and is only getting sporadic treatments.  Several barriers include bipolar disorder and hearing loss.  Appreciate SW/CM assistance in finding SNF placement to help with outpt HD compliance as well as psychiatric issues 3. 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 Nutrition:per primary 6. Hypertension:stable 7. Vascular access- had TDC removed and using L AVF 8. Disposition- appreciate SW/CM assistance 9. Bipolar disorder  Alayzha An A 05/15/2015, 12:38 PM

## 2015-05-15 NOTE — Clinical Social Work Placement (Addendum)
   CLINICAL SOCIAL WORK PLACEMENT  NOTE  Date:  05/15/2015  Patient Details  Name: Noah Lewis MRN: [id number] Date of Birth: 1950-11-11  Clinical Social Work is seeking post-discharge placement for this patient at the [place] level of care (*CSW will initial, date and re-position this form in  chart as items are completed):  Yes   Patient/family provided with [place] Work Department's list of facilities offering this level of care within the geographic area requested by the patient (or if unable, by the patient's family).  Yes   Patient/family informed of their freedom to choose among providers that offer the needed level of care, that participate in Medicare, Medicaid or managed care program needed by the patient, have an available bed and are willing to accept the patient.  Yes   Patient/family informed of [place]'s ownership interest in [facility] and [facility], as well as of the fact that they are under no obligation to receive care at these facilities.  PASRR submitted to EDS on       PASRR number received on       Existing PASRR number confirmed on 05/15/15     FL2 transmitted to all facilities in geographic area requested by pt/family on 05/15/15     FL2 transmitted to all facilities within larger geographic area on       Patient informed that his/her managed care company has contracts with or will negotiate with certain facilities, including the following:        Yes   Patient/family informed of bed offers received.  Patient chooses bed at [place]     Physician recommends and patient chooses bed at      Patient to be transferred to [facility] on  .  Patient to be transferred to facility by Ambulance     Patient family notified on   of transfer.  Name of family member notified:        PHYSICIAN Please sign FL2     Additional Comment:  Patient has bed at [facility] here in [place]. CSW has notified [name] of need to have HD moved to [place], appreciate her assistance with this transition. Patient's mother [name] has also been updated of this. CSW will continue to follow. FL2 on chart for MD signature.  +++To clarify, [facility] will NOT transport the patient to [facility] for his HD. The facility is willing to offer a bed if his HD can be moved to [place]. The patient does NOT have any other bed offers in [facility], so this is his only option, unless he goes to a shelter at discharge.+++  _______________________________________________ Liz Beach MSW, [place], [place], [phone]

## 2015-05-15 NOTE — Care Management Note (Signed)
Case Management Note  Patient Details  Name: Edenilson Austad MRN: [id number] Date of Birth: [birth year]  Subjective/Objective:                 Patient homeless, has barriers with compliance. Expected discharge to SNF.   Action/Plan:  Will continue to follow and offer resources as needed. Expected disposition to SNF at this time. Expected Discharge Date:                  Expected Discharge Plan:  Skilled Nursing Facility  In-House Referral:  Clinical Social Work  Discharge planning Services  CM Consult  Post Acute Care Choice:    Choice offered to:     DME Arranged:    DME Agency:     HH Arranged:    [place] Agency:     Status of Service:  In process, will continue to follow  Medicare Important Message Given:    Date Medicare IM Given:    Medicare IM give by:    Date Additional Medicare IM Given:    Additional Medicare Important Message give by:     If discussed at [place] of Stay Meetings, dates discussed:    Additional Comments:  Carles Collet, RN 05/15/2015, 2:14 PM

## 2015-05-16 DIAGNOSIS — F3130 Bipolar disorder, current episode depressed, mild or moderate severity, unspecified: Secondary | ICD-10-CM

## 2015-05-16 LAB — GLUCOSE, CAPILLARY
GLUCOSE-CAPILLARY: 84 mg/dL (ref 65–99)
GLUCOSE-CAPILLARY: 120 mg/dL — AB (ref 65–99)
Glucose-Capillary: 121 mg/dL — ABNORMAL HIGH (ref 65–99)
Glucose-Capillary: 87 mg/dL (ref 65–99)

## 2015-05-16 LAB — PROTIME-INR
INR: 1.71 — AB (ref 0.00–1.49)
PROTHROMBIN TIME: 20.1 s — AB (ref 11.6–15.2)

## 2015-05-16 MED ORDER — DICLOFENAC SODIUM 1 % TD GEL
2.0000 g | Freq: Four times a day (QID) | TRANSDERMAL | Status: DC
  Administered 2015-05-16 – 2015-05-20 (×15): 2 g via TOPICAL
  Filled 2015-05-16 (×2): qty 100

## 2015-05-16 MED ORDER — DOXERCALCIFEROL 4 MCG/2ML IV SOLN
2.0000 ug | INTRAVENOUS | Status: DC
  Administered 2015-05-17: 2 ug via INTRAVENOUS
  Filled 2015-05-16 (×3): qty 2

## 2015-05-16 MED ORDER — WARFARIN SODIUM 5 MG PO TABS
5.0000 mg | ORAL_TABLET | Freq: Once | ORAL | Status: AC
  Administered 2015-05-16: 5 mg via ORAL
  Filled 2015-05-16: qty 1

## 2015-05-16 NOTE — Progress Notes (Signed)
ANTICOAGULATION CONSULT NOTE - Follow- Up  Pharmacy Consult for warfarin Indication: atrial fibrillation  Allergies  Allergen Reactions  . Penicillins Other (See Comments)    unsure    Patient Measurements: Weight: 79.7 kg Height: 5\' 7"   Vital Signs: Temp: 98.8 F (37.1 C) (08/23 0444) Temp Source: Oral (08/23 0444) BP: 110/87 mmHg (08/23 0444) Pulse Rate: 75 (08/23 0444)  Labs:  Recent Labs  05/14/15 0533 05/14/15 1032 05/14/15 1629 05/15/15 0900 05/16/15 0720  HGB  --   --  10.3*  --   --   HCT  --   --  32.1*  --   --   PLT  --   --  195  --   --   LABPROT 15.7*  --   --  19.2* 20.1*  INR 1.24  --   --  1.61* 1.71*  CREATININE  --  10.56*  --  11.91*  --     Estimated Creatinine Clearance: 5.9 mL/min (by C-G formula based on Cr of 11.91).   Medical History: Past Medical History  Diagnosis Date  . Cancer   . Arthritis   . Renal disorder   . Hypertension   . Diabetes mellitus without complication   . Asthma   . COPD (chronic obstructive pulmonary disease)   . Coronary artery disease     Assessment: 64 yo M admitted 05/12/2015 presents with c/o worsening SOB. Pt had not been to HD since last admission on 05/03/15. He also c/o weakness, fatigue, and worsening back pain. Went for emergent HD d/t significant volume overload.  PMH: ESRD, SVC syndrome, DM2, HTN, COPD  Mr. Klatt was found to be in Afib and pharmacy is consulted to dose warfarin. Baseline INR = 1.36, H/H stable, Plts stable, no s/sx of bleeding noted. Warfarin predictor points = 7  INR is trending up nicely - today 1.71.  Goal of Therapy:  INR 2-3 Monitor platelets by anticoagulation protocol: Yes   Plan:  Will give warfarin 5 mg x1 tonight Monitor daily INR, CBC, s/sx of bleeding D/C SQ Heparin when INR >2  Horton Chin, Pharm.D., BCPS Clinical Pharmacist Pager [phone] 05/16/2015 11:51 AM

## 2015-05-16 NOTE — Procedures (Signed)
Bedside Audiometric Evaluation  Name:  Noah Lewis DOB:   1951/05/05 MRN:    [id number]  Reason for Referral: Hearing loss  Pain:  None  Audiological Assessment:  Due to the severity of Mr. Bilotti hearing impairment it was difficult to instruct him on the audiology testing procedure. No masking was used with bone conduction testing for this reason.  Mr. Weinand would indicate he heard the tones by nodding his head or saying 'beep-beep'  Audiometric Results in dB:              Test reliability fair-good   250 Hz 500 Hz 1000 Hz  2000   Hz 4000 Hz 8000 Hz  Right ear Air conduction 60 85 80 70 65 50  Left ear Air conduction 65 80 90 80 80 75  Left ear Bone conduction unmasked *20 50 55 50 55 -   * Response at 250Hz  for bone conduction could have been vibrotactile  Tympanometry Right ear: Abnormal (flat)  tympanic membrane compliance and pressure (type B). Left ear:  Large ear canal volume with negative (retracted) middle ear pressure (type C).  Impression: Mr. 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 In the right ear, Mr. Hoh demonstrated a moderate hearing loss at 250Hz , dropping to severe at 500 - 2000Hz , then rising to moderate at 8000Hz .  In the left ear, Mr. Primeau demonstrated a moderate hearing loss at 250Hz , dropping to a severe loss at 500Hz  through 8000Hz .  Due to abnormal tympanometry and the conductive component on today's audiogram, an ENT consult is recommended.  Amplification would likely benefit Mr. Dhawan communication abilities; however, no device was available.  Patient/Family Education:  The patient agreed to testing per MD order.  The test results and recommendations were explained to [facility] residents arriving as testing was completed.  Results could not be explained to Mr. Mackowski due to his hearing loss.    Recommendations:  ENT consult prior to discharge.   Amplification of  some type is also recommended (personal amplifier or hearing aids)  If you have any questions, please call [phone].  Sherri A. Rosana Hoes, Au.D., [facility] Doctor of Audiology 05/16/2015 5:15 PM

## 2015-05-16 NOTE — Progress Notes (Signed)
Patient refused medication explain the important patient went back to sleep

## 2015-05-16 NOTE — Progress Notes (Addendum)
Went to patient's room this afternoon the discuss the importance of hemodialysis and why it's necessary. Explained that his kidneys, which act like a filter and make urine, don't work so he needs dialysis to replace the job of his kidneys. While the conversation was limited by hearing loss, this could be overcome by speaking loudly and closely. I have asked for an audiology consult.   He said one of the main reasons he does not want to go to hemodialysis is that the HD beds are uncomfortable and worsens his back pain. Will add heat packs prn, ice packs prn, and voltaren gel to apply to right lower back.  He said he would absolutely do his scheduled hemodialysis tomorrow.
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
## 2015-05-16 NOTE — Progress Notes (Signed)
Placed patient on CPAP and he is tolerating it well. RT will continue to monitor.

## 2015-05-16 NOTE — Progress Notes (Signed)
Subjective:  [name] was seen and examined this morning. He is agreeable to going to a skilled nursing facility. Otherwise, he was wondering why breakfast had not arrived yet and said that his back was hurt from the way he was sitting in the dialysis bed. He was adherent with his Warfarin.  Objective: Vital signs in last 24 hours: Filed Vitals:   05/15/15 2040 05/16/15 0008 05/16/15 0444 05/16/15 0450  BP: 135/85 133/80 110/87   Pulse: 74 87 75   Temp: 98.7 F (37.1 C) 97.5 F (36.4 C) 98.8 F (37.1 C)   TempSrc: Oral Oral Oral   Resp: 18 20 20    Weight:    170 lb 6.7 oz (77.3 kg)  SpO2: 98% 100% 98%    Weight change: 2 lb 13.9 oz (1.3 kg)  Intake/Output Summary (Last 24 hours) at 05/16/15 8937 Last data filed at 05/15/15 1724  Gross per 24 hour  Intake    120 ml  Output   3685 ml  Net  -3565 ml   Physical Exam:  General:  Lying in bed in no acute distress HEENT:  Facial swelling and upper extremity edema Cardiac:  Irregularly irregular rhythm with normal rate. No murmurs. Dressing over AVF site. Pulmonary:  Moving normal volumes of air. No wheezes or crackles heard. Witnessed apneic event when sleeping. Abdominal:  +BS. Soft, nontender, nondistended Neuro:  Alert, non-drowsy, and responding appropriately to most questions. Delene Ruffini says "he's the President." Knows who is running for president. Hard of hearing. Psych:  Said mood was "good."  Lab Results: Basic Metabolic Panel:  Recent Labs Lab 05/14/15 1032 05/15/15 0900  NA 141 143  K 3.7 3.6  CL 103 105  CO2 23 23  GLUCOSE 152* 118*  BUN 57* 68*  CREATININE 10.56* 11.91*  CALCIUM 8.2* 8.0*  PHOS 7.2* 7.6*   Liver Function Tests:  Recent Labs Lab 05/12/15 2113  05/14/15 1032 05/15/15 0900  AST 29  --   --   --   ALT 27  --   --   --   ALKPHOS 81  --   --   --   BILITOT 0.6  --   --   --   PROT 7.4  --   --   --   ALBUMIN 3.7  < > 3.1* 3.2*  < > = values in this interval not  displayed. CBC:  Recent Labs Lab 05/12/15 2339 05/14/15 1629  WBC 5.9 5.2  HGB 10.3* 10.3*  HCT 31.4* 32.1*  MCV 84.4 84.3  PLT 217 195   CBG:  Recent Labs Lab 05/14/15 1203 05/14/15 1654 05/15/15 0745 05/15/15 1807 05/16/15 0008 05/16/15 0815  GLUCAP 110* 97 83 90 87 84   Coagulation:  Recent Labs Lab 05/13/15 1150 05/14/15 0533 05/15/15 0900  LABPROT 16.9* 15.7* 19.2*  INR 1.36 1.24 1.61*   Studies/Results: No results found. Medications: I have reviewed the patient's current medications. Scheduled Meds: . aspirin  81 mg Oral Daily  . calcium acetate  1,334 mg Oral TID WC  . heparin  5,000 Units Subcutaneous 3 times per day  . insulin aspart  0-5 Units Subcutaneous QHS  . insulin aspart  0-9 Units Subcutaneous TID WC  . labetalol  100 mg Oral 3 times per day  . sodium chloride  3 mL Intravenous Q12H  . warfarin   Does not apply Once  . Warfarin - Pharmacist Dosing Inpatient   Does not apply q1800   Continuous Infusions:  PRN Meds:.heparin, heparin, ipratropium-albuterol, lidocaine (PF), lidocaine-prilocaine, pentafluoroprop-tetrafluoroeth Assessment/Plan:  Volume Overload 2/2 ESRD and Hemodialysis Non-Adherence: He denies any shortness of breath this morning and is in no acute distress. He will resume MWF HD schedule tomorrow - A plan is in progress to meet his HD needs upon discharge. As of now, he has a SNF bed at [facility]. - Discussion on the importance of HD this afternoon. - Will be in touch from [place] from HD to have dialysis moved to [facility] Onset Atrial Fibrillation: Likely secondary to ESRD. Started on coumadin instead of NOACs in setting of ESRD. Warfarin would be a good option if he is discharged to SNF and will have consistent access to HD with consistent INR checks. However, if he is discharged to the street, this would be a poor option and may benefit from ASA as an alternative. - On Labetolol 100 mg TID for HTN.  He has been intermittently adherent with this medication. Will consider transitioning to metoprolol.  Possible Obstructive Sleep Apnea:  Witnessed apneic event during exam. Likely due to chronic SVC syndrome. Will need sleep study as an outpatient in order to obtain CPAP.  Superior Vena Cava Syndrome: Ongoing issue. Overall is improved from his first admission when his PC was removed. Previous records from HD centers suggest this is due to ongoing upper extremity DVTs, although last admission, upper extremity US was unremarkable.  Type 2 Diabetes: No medications. Recent A1c of 5.7. CBGs have been stable (83-110), although has been refusing some CBGs. - CBGs ac/hs with SSI-S  HTN: Labetolol may have contributed to bradycardia to 39 in setting of afib on 8/20. Dose was decreased to current dose. - 100 mg TID  Homelessness:  Significant barrier to care. Plan for now is to be discharged to SNF - CSW following  Bipolar Disorder: Unconfimed diagnosis. Will be evaluated by psychiatry to assess diagnosis and may make recommendations on possible medication management.  Hearing Loss:  Audiology assessment before discharge.  Dispo: Disposition is deferred at this time, awaiting improvement of current medical problems.  Anticipated discharge in approximately 2-3 days.  The patient does not have a current PCP (Provider Not In System) and does not need an [facility] hospital follow-up appointment after discharge. He needs to establish care at [facility]  The patient does have transportation limitations that hinder transportation to clinic appointments. He will need access to a HD center on discharge.  .Services Needed at time of discharge: Y = Yes, Blank = No PT: Yes  OT:   RN:   Equipment:   Other: Skilled Nursing    LOS: 3 days   Liberty Handy, MD 05/16/2015, 8:32 AM

## 2015-05-16 NOTE — Progress Notes (Signed)
Physical Therapy Treatment Patient Details Name: Noah Lewis MRN: [id number] DOB: 1951/05/20 Today's Date: 05-31-2015    History of Present Illness 64 Y O M with PMH of ESRD, HTN, Bipolar disorder, cardiac disease- admitted with shortness of breath  and fluid overload after missing HD     PT Comments    Pt making steady progress.  Follow Up Recommendations  SNF     Equipment Recommendations  None recommended by PT    Recommendations for Other Services       Precautions / Restrictions Precautions Precautions: Fall Precaution Comments: camera room, chair/bed alarm, uses cane limited by low back pain    Mobility  Bed Mobility                  Transfers Overall transfer level: Needs assistance Equipment used: Straight cane   Sit to Stand: Supervision         General transfer comment: assist for safety  Ambulation/Gait Ambulation/Gait assistance: Min guard Ambulation Distance (Feet): 175 Feet Assistive device: Straight cane Gait Pattern/deviations: Step-through pattern;Decreased stride length   Gait velocity interpretation: Below normal speed for age/gender General Gait Details: slow and slightly unsteady but no loss of balance   Stairs            Wheelchair Mobility    Modified Rankin (Stroke Patients Only)       Balance     Sitting balance-Leahy Scale: Good       Standing balance-Leahy Scale: Fair                      Cognition Arousal/Alertness: Awake/alert Behavior During Therapy: Flat affect Overall Cognitive Status: No family/caregiver present to determine baseline cognitive functioning                      Exercises      General Comments        Pertinent Vitals/Pain      Home Living                      Prior Function            PT Goals (current goals can now be found in the care plan section) Acute Rehab PT Goals Patient Stated Goal: did not state PT Goal Formulation: Patient  unable to participate in goal setting Time For Goal Achievement: 05/28/15 Potential to Achieve Goals: Fair Progress towards PT goals: Progressing toward goals    Frequency  Min 2X/week    PT Plan Current plan remains appropriate    Co-evaluation             End of Session   Activity Tolerance: Patient tolerated treatment well Patient left: with call bell/phone within reach;in bed;with bed alarm set (camera room)     Time: 1716-1730 PT Time Calculation (min) (ACUTE ONLY): 14 min  Charges:  $Gait Training: 8-22 mins                    G Codes:      Andreana Klingerman May 31, 2015, 5:41 PM  [facility] PT [phone]

## 2015-05-16 NOTE — Clinical Social Work Psych Note (Signed)
Psychiatrist is requesting assistance with obtaining patient's medication regimen. RN to obtain written consent of release from patient and place on his chart for psychiatry to send to psychiatric facilities to gather needed information.  RN Ardeen Fillers aware of this need and agreeable.  RN states that patient is in HD and will obtain signature of consent once patient is on the unit.  Nonnie Done, LCSW [phone]  Psychiatric & Orthopedics (5N 1-8) Clinical Social Worker

## 2015-05-16 NOTE — Consult Note (Signed)
[place] Psychiatry Consult   Reason for Consult:  Bipolar disorder evaluation and non compliant with medication Referring Physician:  Dr. Lynnae January Patient Identification: Noah Lewis MRN:  [id number] Principal Diagnosis: Bipolar depression Diagnosis:   Patient Active Problem List   Diagnosis Date Noted  . ESRD on hemodialysis [N18.6, Z99.2] 05/13/2015  . New onset atrial fibrillation [I48.91] 05/13/2015  . Volume overload [E87.70] 04/28/2015  . Fluid overload [E87.70] 04/28/2015  . Bipolar depression [F31.30] 04/28/2015  . HTN (hypertension) [I10] 04/28/2015  . Chronic back pain [M54.9, G89.29] 04/28/2015  . Facial swelling [R22.0] 04/28/2015  . Left upper extremity swelling [M79.89]     Total Time spent with patient: 1 hour  Subjective:   Noah Lewis is a 64 y.o. male patient admitted with non compliant with treatment.  HPI:  Noah Lewis is a 64 y.o. male seen face-to-face for psychiatric consultation and evaluation of mood disorder especially bipolar disorder and noncompliant with the medication management. Patient is known to this provider from his recent admission to the [facility] for noncompliant with hemodialysis, overloaded with fluids and shortness of breath. Patient was discharged after stabilized. Patient maternal aunt who walked into the patient's room contributed with this information. Patient was stayed with her for a few days and then left to [facility] and has no follow-up hemodialysis her medication management since last discharge from the hospital. Reportedly patient cannot go back to his place in [place] secondary to his roommates left the place and he cannot live by himself. Reportedly patient is willing to be placed in assisted living facility after medically stabilizing at this time. Patient has hearing impairment and difficult to follow through. Patient reportedly admitted with generalized weakness, fatigue, worsening  back pain. Patient usually hear better when medically stabilized based on his last hospitalization. Patient called his mother requested emergency medical services but he shortness of breath. Patient cannot stay with his mother because of family issues which was not discussed during this evaluation. Patient has no suicidal or homicidal ideation. Patient does not appear to be responding to internal stimuli. Case discussed with the psychiatric social service and requested to contact the previous and mental health providers her medical providers regarding outpatient medication management. Patient has no clear evidence that he has bipolar disorder at this time. Patient previously admitted to behavioral health Hospital in 2002 and electronic medical records were not available.  HPI Elements:   Location:  Mood disorder noncompliant with medication. Quality:  Poor. Severity:  Shortness of breath and generalized weakness secondary to no treatment. Timing:  Left his place in [facility] about 4 weeks ago. Duration:  4 weeks. Context:  Unknown multiple psychosocial stressors and family issues.  Past Medical History:  Past Medical History  Diagnosis Date  . Cancer   . Arthritis   . Renal disorder   . Hypertension   . Diabetes mellitus without complication   . Asthma   . COPD (chronic obstructive pulmonary disease)   . Coronary artery disease     Past Surgical History  Procedure Laterality Date  . No past surgeries    . Insertion of dialysis catheter     Family History: No family history on file. Social History:  History  Alcohol Use No     History  Drug Use No    Social History   Social History  . Marital Status: Married    Spouse Name: N/A  . Number of Children: N/A  . Years of Education:  N/A   Social History Main Topics  . Smoking status: Never Smoker   . Smokeless tobacco: Never Used  . Alcohol Use: No  . Drug Use: No  . Sexual Activity: Not Asked   Other Topics Concern  .  None   Social History Narrative   Additional Social History:                          Allergies:   Allergies  Allergen Reactions  . Penicillins Other (See Comments)    unsure    Labs:  Results for orders placed or performed during the hospital encounter of 05/12/15 (from the past 48 hour(s))  Glucose, capillary     Status: Abnormal   Collection Time: 05/14/15 12:03 PM  Result Value Ref Range   Glucose-Capillary 110 (H) 65 - 99 mg/dL  CBC     Status: Abnormal   Collection Time: 05/14/15  4:29 PM  Result Value Ref Range   WBC 5.2 4.0 - 10.5 K/uL   RBC 3.81 (L) 4.22 - 5.81 MIL/uL   Hemoglobin 10.3 (L) 13.0 - 17.0 g/dL   HCT 32.1 (L) 39.0 - 52.0 %   MCV 84.3 78.0 - 100.0 fL   MCH 27.0 26.0 - 34.0 pg   MCHC 32.1 30.0 - 36.0 g/dL   RDW 17.8 (H) 11.5 - 15.5 %   Platelets 195 150 - 400 K/uL  Glucose, capillary     Status: None   Collection Time: 05/14/15  4:54 PM  Result Value Ref Range   Glucose-Capillary 97 65 - 99 mg/dL  Glucose, capillary     Status: None   Collection Time: 05/15/15  7:45 AM  Result Value Ref Range   Glucose-Capillary 83 65 - 99 mg/dL  Protime-INR     Status: Abnormal   Collection Time: 05/15/15  9:00 AM  Result Value Ref Range   Prothrombin Time 19.2 (H) 11.6 - 15.2 seconds   INR 1.61 (H) 0.00 - 1.49  Renal function panel     Status: Abnormal   Collection Time: 05/15/15  9:00 AM  Result Value Ref Range   Sodium 143 135 - 145 mmol/L   Potassium 3.6 3.5 - 5.1 mmol/L   Chloride 105 101 - 111 mmol/L   CO2 23 22 - 32 mmol/L   Glucose, Bld 118 (H) 65 - 99 mg/dL   BUN 68 (H) 6 - 20 mg/dL   Creatinine, Ser 11.91 (H) 0.61 - 1.24 mg/dL   Calcium 8.0 (L) 8.9 - 10.3 mg/dL   Phosphorus 7.6 (H) 2.5 - 4.6 mg/dL   Albumin 3.2 (L) 3.5 - 5.0 g/dL   GFR calc non Af Amer 4 (L) >60 mL/min   GFR calc Af Amer 5 (L) >60 mL/min    Comment: (NOTE) The eGFR has been calculated using the CKD EPI equation. This calculation has not been validated in all  clinical situations. eGFR's persistently <60 mL/min signify possible Chronic Kidney Disease.    Anion gap 15 5 - 15  Glucose, capillary     Status: None   Collection Time: 05/15/15  6:07 PM  Result Value Ref Range   Glucose-Capillary 90 65 - 99 mg/dL  Glucose, capillary     Status: None   Collection Time: 05/16/15 12:08 AM  Result Value Ref Range   Glucose-Capillary 87 65 - 99 mg/dL   Comment 1 Notify RN    Comment 2 Document in Chart   Protime-INR  Status: Abnormal   Collection Time: 05/16/15  7:20 AM  Result Value Ref Range   Prothrombin Time 20.1 (H) 11.6 - 15.2 seconds   INR 1.71 (H) 0.00 - 1.49  Glucose, capillary     Status: None   Collection Time: 05/16/15  8:15 AM  Result Value Ref Range   Glucose-Capillary 84 65 - 99 mg/dL    Vitals: Blood pressure 110/87, pulse 75, temperature 98.8 F (37.1 C), temperature source Oral, resp. rate 20, weight 77.3 kg (170 lb 6.7 oz), SpO2 98 %.  Risk to Self: Is patient at risk for suicide?: No Risk to Others:   Prior Inpatient Therapy:   Prior Outpatient Therapy:    Current Facility-Administered Medications  Medication Dose Route Frequency Provider Last Rate Last Dose  . aspirin chewable tablet 81 mg  81 mg Oral Daily Corky Sox, MD   81 mg at 05/16/15 1013  . calcium acetate (PHOSLO) capsule 1,334 mg  1,334 mg Oral TID WC Donato Heinz, MD   1,334 mg at 05/16/15 0800  . [START ON 05/17/2015] doxercalciferol (HECTOROL) injection 2 mcg  2 mcg Intravenous Q M,W,F-HD Donato Heinz, MD      . heparin injection 1,000 Units  1,000 Units Dialysis PRN Dwana Melena, MD      . heparin injection 1,000 Units  1,000 Units Dialysis PRN Dwana Melena, MD      . heparin injection 5,000 Units  5,000 Units Subcutaneous 3 times per day Corky Sox, MD   5,000 Units at 05/16/15 0021  . insulin aspart (novoLOG) injection 0-5 Units  0-5 Units Subcutaneous QHS Corky Sox, MD   0 Units at 05/13/15 2200  . insulin aspart (novoLOG) injection 0-9  Units  0-9 Units Subcutaneous TID WC Corky Sox, MD   0 Units at 05/13/15 0800  . ipratropium-albuterol (DUONEB) 0.5-2.5 (3) MG/3ML nebulizer solution 3 mL  3 mL Nebulization Q6H PRN Corky Sox, MD      . labetalol (NORMODYNE) tablet 100 mg  100 mg Oral 3 times per day Liberty Handy, MD   100 mg at 05/16/15 0021  . lidocaine (PF) (XYLOCAINE) 1 % injection 5 mL  5 mL Intradermal PRN Dwana Melena, MD      . lidocaine-prilocaine (EMLA) cream 1 application  1 application Topical PRN Dwana Melena, MD      . pentafluoroprop-tetrafluoroeth Landry Dyke) aerosol 1 application  1 application Topical PRN Dwana Melena, MD      . sodium chloride 0.9 % injection 3 mL  3 mL Intravenous Q12H Corky Sox, MD   3 mL at 05/16/15 1012  . warfarin (COUMADIN) video   Does not apply Once [employer], [facility]      . Warfarin - Pharmacist Dosing Inpatient   Does not apply [place], [facility]   1 each at 05/14/15 1800    Musculoskeletal: Strength & Muscle Tone: decreased Gait & Station: unable to stand Patient leans: N/A  Psychiatric Specialty Exam: Physical Exam as per history and physical   ROS complaints about back pain, shortness of breath and hard hearing but denied nausea and vomiting and chest pain No Fever-chills, No Headache, No changes with Vision or hearing, reports vertigo No problems swallowing food or Liquids, No Chest pain, Cough or Shortness of Breath, No Abdominal pain, No Nausea or Vommitting, Bowel movements are regular, No Blood in stool or Urine, No dysuria, No new skin rashes or bruises, No new  joints pains-aches,  No new weakness, tingling, numbness in any extremity, No recent weight gain or loss, No polyuria, polydypsia or polyphagia,   A full 10 point Review of Systems was done, except as stated above, all other Review of Systems were negative.  Blood pressure 110/87, pulse 75, temperature 98.8 F (37.1 C), temperature source Oral, resp. rate 20, weight 77.3 kg (170 lb 6.7 oz),  SpO2 98 %.Body mass index is 26.68 kg/(m^2).  General Appearance: Guarded  Eye Contact::  Fair  Speech:  Slow and Slurred  Volume:  Decreased  Mood:  Depressed  Affect:  Constricted and Depressed  Thought Process:  Coherent  Orientation:  Full (Time, Place, and Person)  Thought Content:  WDL  Suicidal Thoughts:  No  Homicidal Thoughts:  No  Memory:  Immediate;   Fair Recent;   Poor  Judgement:  Impaired  Insight:  Shallow  Psychomotor Activity:  Decreased  Concentration:  Fair  Recall:  [employer] of Knowledge:Fair  Language: Fair  Akathisia:  Negative  Handed:  Right  AIMS (if indicated):     Assets:  Communication Skills Desire for Improvement Leisure Time Resilience Social Support  ADL's:  Impaired  Cognition: Impaired,  Mild  Sleep:      Medical Decision Making: Review of Psycho-Social Stressors (1), Review or order clinical lab tests (1), Established Problem, Worsening (2), Review of Last Therapy Session (1), Review or order medicine tests (1), Review of Medication Regimen & Side Effects (2) and Review of New Medication or Change in Dosage (2)  Treatment Plan Summary: Patient presented with multiple medical problems including end-stage renal disease and on dialysis but noncompliant with the treatment since discharge from the hospital about 10 days ago. Patient family cannot provide shelter to him but supportive and communicated to him. Patient can't contract for safety while in the hospital. Daily contact with patient to assess and evaluate symptoms and progress in treatment and Medication management  Plan: Psychiatric clinical social service will contact outpatient providers and renal dialysis center regarding his outpatient medication management for behavioral health as patient was not able to remember names of the specific medications. Patient does not meet criteria for psychiatric inpatient admission. Supportive therapy provided about ongoing stressors. Appreciate  psychiatric consultation Please contact [phone] or [phone] if needs further assistance   Disposition: Patient benefit from assisted living facility because patient has no place to live and unable to secure necessary medication treatment and hemodialysis on his own since discharge from the hospital about a 10 days ago  Hendrum R. 05/16/2015 10:47 AM

## 2015-05-16 NOTE — Progress Notes (Addendum)
Patient ID: Noah Lewis, male   DOB: 01-Mar-1951, 63 y.o.   MRN: [id number]  [facility] Progress Note    Subjective:   No new complaints   Objective:   BP 110/87 mmHg  Pulse 75  Temp(Src) 98.8 F (37.1 C) (Oral)  Resp 20  Wt 77.3 kg (170 lb 6.7 oz)  SpO2 98%  Intake/Output: I/O last 3 completed shifts: In: 320 [P.O.:320] Out: 3765 [Urine:80; Other:3685]   Intake/Output this shift:    Weight change: 1.3 kg (2 lb 13.9 oz)  Physical Exam: Gen:WD AAM in NAd CVS:no rub Resp:occ rhonchi ZDG:UYQIHK Ext:+ upper ext edema, LUE AVF +T/B  Labs: BMET  Recent Labs Lab 05/12/15 2113 05/12/15 2127 05/12/15 2339 05/13/15 0755 05/14/15 1032 05/15/15 0900  NA 141 140 140 139 141 143  K 4.1 4.3 4.3 4.3 3.7 3.6  CL 105 107 106 102 103 105  CO2 17*  --  15* 22 23 23   GLUCOSE 142* 141* 89 79 152* 118*  BUN 90* 96* 91* 48* 57* 68*  CREATININE 14.78* 14.00* 14.49* 9.18* 10.56* 11.91*  ALBUMIN 3.7  --  3.6 3.4* 3.1* 3.2*  CALCIUM 8.2*  --  8.2* 8.6* 8.2* 8.0*  PHOS  --   --  8.6* 5.3*  5.4* 7.2* 7.6*   CBC  Recent Labs Lab 05/12/15 2113 05/12/15 2127 05/12/15 2339 05/14/15 1629  WBC 6.7  --  5.9 5.2  HGB 10.4* 11.6* 10.3* 10.3*  HCT 32.1* 34.0* 31.4* 32.1*  MCV 85.1  --  84.4 84.3  PLT 205  --  217 195    @IMGRELPRIORS @ Medications:    . aspirin  81 mg Oral Daily  . calcium acetate  1,334 mg Oral TID WC  . heparin  5,000 Units Subcutaneous 3 times per day  . insulin aspart  0-5 Units Subcutaneous QHS  . insulin aspart  0-9 Units Subcutaneous TID WC  . labetalol  100 mg Oral 3 times per day  . sodium chloride  3 mL Intravenous Q12H  . warfarin   Does not apply Once  . Warfarin - Pharmacist Dosing Inpatient   Does not apply q1800   Dialyzes at [facility] 77 kg (was 80kg). HD Bath 3K, 3calc, Dialyzer 200, Heparin none. AVF LUE. hectorol 2 mcg per tx- does not look like he is on ESA  Assessment/ Plan:   1. SOB- due to  noncompliance with hemodialysis and volume overload. Improved with HD yesterday and looks much better today.  off schedule so will continue with MWF for now until he has outpt HD and will adjust based on that schedule.   2. ESRD: nonadherent with HD at his home unit in [facility] and is only getting sporadic treatments. Several barriers include bipolar disorder and hearing loss. Appreciate SW/CM assistance in finding SNF placement to help with outpt HD compliance as well as psychiatric issues 1. Doing better with HD and mood and now will be going to [facility] so transportation will not be an issue moving forward 2. Await outpt HD 3. If he refuses HD again tomorrow he will not be accepted here in [place] for dialysis and will need to wait for a SNF in [place] 3. 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 calcium acetate and follow 5. Nutrition:per primary 6. Hypertension:stable 7. Vascular access- had TDC removed and using L AVF 8. Disposition- appreciate SW/CM assistance and now has room at [facility].  Awaiting outpt  HD spot.  Unclear why he is not going back to [facility] where he already has an established HD center.  If he refuses HD again while an inpatient, he will not be accepted to any units here in [place]. 9. Bipolar disorder- stable but needs to follow up with Psych as well as medications which were supposed to have been followed up by Psychiatry on 05/02/15 and still no mention or prescription for medications related to his Bipolar Disorder.  This needs to be addressed before discharge to help prevent readmission. 10. Hearing loss- would greatly benefit from audiology eval and possible hearing aids.  Etiology of hearing loss unknown.  Horatio Bertz A 05/16/2015, 8:30 AM

## 2015-05-16 NOTE — Progress Notes (Signed)
Pt refused CBG to be checked. Pt states " I ain't no diabetic"

## 2015-05-17 LAB — BASIC METABOLIC PANEL
ANION GAP: 13 (ref 5–15)
BUN: 40 mg/dL — ABNORMAL HIGH (ref 6–20)
CO2: 25 mmol/L (ref 22–32)
CREATININE: 9.14 mg/dL — AB (ref 0.61–1.24)
Calcium: 8.1 mg/dL — ABNORMAL LOW (ref 8.9–10.3)
Chloride: 98 mmol/L — ABNORMAL LOW (ref 101–111)
GFR calc Af Amer: 6 mL/min — ABNORMAL LOW (ref >60)
GFR calc non Af Amer: 5 mL/min — ABNORMAL LOW (ref >60)
GLUCOSE: 148 mg/dL — AB (ref 65–99)
Potassium: 3.8 mmol/L (ref 3.5–5.1)
Sodium: 136 mmol/L (ref 135–145)

## 2015-05-17 LAB — PROTIME-INR
INR: 1.74 — ABNORMAL HIGH (ref 0.00–1.49)
Prothrombin Time: 20.3 seconds — ABNORMAL HIGH (ref 11.6–15.2)

## 2015-05-17 LAB — GLUCOSE, CAPILLARY
GLUCOSE-CAPILLARY: 93 mg/dL (ref 65–99)
Glucose-Capillary: 92 mg/dL (ref 65–99)

## 2015-05-17 MED ORDER — DOXERCALCIFEROL 4 MCG/2ML IV SOLN
INTRAVENOUS | Status: AC
  Administered 2015-05-17: 2 ug via INTRAVENOUS
  Filled 2015-05-17: qty 2

## 2015-05-17 MED ORDER — WARFARIN SODIUM 7.5 MG PO TABS
7.5000 mg | ORAL_TABLET | Freq: Once | ORAL | Status: AC
  Administered 2015-05-17: 7.5 mg via ORAL
  Filled 2015-05-17: qty 1

## 2015-05-17 NOTE — Procedures (Signed)
Patient was seen on dialysis and the procedure was supervised. BFR 400 Via LUE AVF BP is 143/81.  Patient appears to be tolerating treatment well

## 2015-05-17 NOTE — Clinical Social Work Note (Signed)
Patient continues to have a bed at [facility] once his HD can be moved to a [place] location. Unfortunately the patient does not have a bed offer in the [facility] area. [facility] is only willing to make a bed offer if dialysis can be arranged in [place] area. CSW will continue to follow.    Liz Beach MSW, [place], [place], [phone]

## 2015-05-17 NOTE — Progress Notes (Signed)
Subjective:  Noah Lewis was seen and examined after his hemodialysis session. Unfortunately, he finished his HD session 30 minutes early due to back pain. When I asked him why he gets dialysis, he said, "It helps get rid of the poisons in my body." When I asked what would happen if he stopped getting dialysis, he said, "I would fall asleep and not wake up." He said he would be better able to complete a dialysis session if his back pain were better controlled.  Said that he had a good night's sleep with the CPAP   Objective: Vital signs in last 24 hours: Filed Vitals:   05/17/15 1027 05/17/15 1130 05/17/15 1152 05/17/15 1155  BP: 113/75   121/72  Pulse: 83 81 79 78  Temp:      TempSrc:      Resp:    16  Height:      Weight:    167 lb 8.8 oz (76 kg)  SpO2:    91%   Weight change: -9 lb 11.2 oz (-4.4 kg)  Intake/Output Summary (Last 24 hours) at 05/17/15 1413 Last data filed at 05/17/15 1155  Gross per 24 hour  Intake      0 ml  Output   2734 ml  Net  -2734 ml   Physical Exam:  General:  Sitting in chair, in no acute distress. HEENT:  Pingueculae in both sclera. Facial and upper extremity swelling improved from previous. Cardiac:  Irregularly irregular rhythm with normal rate. No murmurs. Dressing over AVF site c/d/i Pulmonary:  Moving normal volumes of air. No wheezes or crackles heard. Abdominal:  +BS. Soft, nontender, nondistended Neuro:  Alert, non-drowsy, and responding appropriately to most questions. Hard of hearing.  Lab Results: Basic Metabolic Panel:  Recent Labs Lab 05/14/15 1032 05/15/15 0900 05/17/15 0828  NA 141 143 136  K 3.7 3.6 3.8  CL 103 105 98*  CO2 23 23 25   GLUCOSE 152* 118* 148*  BUN 57* 68* 40*  CREATININE 10.56* 11.91* 9.14*  CALCIUM 8.2* 8.0* 8.1*  PHOS 7.2* 7.6*  --    CBC:  Recent Labs Lab 05/12/15 2339 05/14/15 1629  WBC 5.9 5.2  HGB 10.3* 10.3*  HCT 31.4* 32.1*  MCV 84.4 84.3  PLT 217 195   CBG:  Recent Labs Lab  05/15/15 1807 05/16/15 0008 05/16/15 0815 05/16/15 1150 05/16/15 1648 05/17/15 1252  GLUCAP 90 87 84 120* 121* 93   Coagulation:  Recent Labs Lab 05/14/15 0533 05/15/15 0900 05/16/15 0720 05/17/15 1258  LABPROT 15.7* 19.2* 20.1* 20.3*  INR 1.24 1.61* 1.71* 1.74*   \Medications: I have reviewed the patient's current medications. Scheduled Meds: . aspirin  81 mg Oral Daily  . calcium acetate  1,334 mg Oral TID WC  . diclofenac sodium  2 g Topical QID  . doxercalciferol  2 mcg Intravenous Q M,W,F-HD  . heparin  5,000 Units Subcutaneous 3 times per day  . insulin aspart  0-5 Units Subcutaneous QHS  . insulin aspart  0-9 Units Subcutaneous TID WC  . labetalol  100 mg Oral 3 times per day  . sodium chloride  3 mL Intravenous Q12H  . warfarin  7.5 mg Oral ONCE-1800  . warfarin   Does not apply Once  . Warfarin - Pharmacist Dosing Inpatient   Does not apply q1800   Continuous Infusions:  PRN Meds:.heparin, heparin, ipratropium-albuterol, lidocaine (PF), lidocaine-prilocaine, pentafluoroprop-tetrafluoroeth Assessment/Plan:  Volume Overload 2/2 ESRD and Hemodialysis Non-Adherence: He denies any shortness of breath  this morning and is in no acute distress. Had to cut HD session 0.5 hr short due to back pain. Patient has a clear understanding of the purpose of HD and that non-adherence will lead to death. - Apply Voltaren gel to right lower back before HD sessions. Bring heating packs to HD sessions. - A plan is in progress to meet his HD needs upon discharge. He has been denied by one HD center in [place]. As of now, he has a SNF bed at [facility].  Hearing Loss:  This is a significant barrier to care. 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 Was advised by Audiology to contact [facility], Nose, and Throat. Should hear from Noah Lewis, Au.D. regarding donated hearing aids on 8/25.  New Onset Atrial Fibrillation: Likely  secondary to ESRD. Started on coumadin instead of NOACs in setting of ESRD. Warfarin would be a good option if he is discharged to SNF and will have consistent access to HD with consistent INR checks. However, if he is discharged to the street, this would be a poor option and may benefit from ASA as an alternative. INR is now 1.74. - Warfarin per pharmacy recommendations - On Labetolol 100 mg TID for HTN. He has been intermittently adherent with this medication. Will consider transitioning to metoprolol.  Possible Obstructive Sleep Apnea:  Witnessed apneic event during exam on 8/23.  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 Had been adherent with CPAP as an inpatient. Will need sleep study as an outpatient in order to obtain CPAP.  Presumed Bipolar Disorder: Consult to Psychiatrist Dr. Louretta Shorten, and he identifies no clear evidence of bipolar disorder at this time. Psychiatric clinical social service will contact outpatient providers and renal dialysis center regarding his outpatient medication management for behavioral health as patient was not able to remember names of the specific medications.   Superior Vena Cava Syndrome: Ongoing issue. Overall is improved from his first admission when his PC was removed. Previous records from HD centers suggest this is due to ongoing upper extremity DVTs, although last admission, upper extremity US was unremarkable.  Type 2 Diabetes: No medications. Recent A1c of 5.7. CBGs have been stable (83-110), although has been refusing some CBGs. - CBGs ac/hs with SSI-S  HTN: Labetolol may have contributed to bradycardia to 39 in setting of afib on 8/20. Dose was decreased to current dose. - 100 mg TID  Homelessness:  Significant barrier to care. Plan for now is to be discharged to SNF - CSW following  Dispo: Disposition is deferred at this time, awaiting improvement of current medical problems.  Anticipated discharge in approximately 2-3 days.  The patient does not have a  current PCP (Provider Not In System) and does not need an [facility] hospital follow-up appointment after discharge. He needs to establish care at [facility]  The patient does have transportation limitations that hinder transportation to clinic appointments. He will need access to a HD center on discharge.  .Services Needed at time of discharge: Y = Yes, Blank = No PT: Yes  OT:   RN:   Equipment:   Other: Skilled Nursing    LOS: 4 days   Liberty Handy, MD 05/17/2015, 2:13 PM

## 2015-05-17 NOTE — Progress Notes (Signed)
Patient is refusing CPAP. RT made patient aware that if he changed his mind to call. RN at bedside.

## 2015-05-17 NOTE — Progress Notes (Signed)
MD on call notified that pt had a 5 beat run of V-tach bp 114/80 hr 81 pt was asymptomatic. Will continue to monitor. Arthor Captain LPN

## 2015-05-17 NOTE — Progress Notes (Signed)
Pt refused to allow CBG to be done stating " I don't have sugar it is fine."

## 2015-05-17 NOTE — Progress Notes (Signed)
ANTICOAGULATION CONSULT NOTE - Follow- Up  Pharmacy Consult for warfarin Indication: atrial fibrillation  Allergies  Allergen Reactions  . Penicillins Other (See Comments)    unsure    Patient Measurements: Weight: 79.7 kg Height: 5\' 7"   Vital Signs: Temp: 97 F (36.1 C) (08/24 0816) Temp Source: Oral (08/24 0816) BP: 121/72 mmHg (08/24 1155) Pulse Rate: 78 (08/24 1155)  Labs:  Recent Labs  05/14/15 1629 05/15/15 0900 05/16/15 0720 05/17/15 0828 05/17/15 1258  HGB 10.3*  --   --   --   --   HCT 32.1*  --   --   --   --   PLT 195  --   --   --   --   LABPROT  --  19.2* 20.1*  --  20.3*  INR  --  1.61* 1.71*  --  1.74*  CREATININE  --  11.91*  --  9.14*  --     Estimated Creatinine Clearance: 7.7 mL/min (by C-G formula based on Cr of 9.14).   Medical History: Past Medical History  Diagnosis Date  . Cancer   . Arthritis   . Renal disorder   . Hypertension   . Diabetes mellitus without complication   . Asthma   . COPD (chronic obstructive pulmonary disease)   . Coronary artery disease     Assessment: 64 yo M admitted 05/12/2015 presents with c/o worsening SOB. Pt had not been to HD since last admission on 05/03/15. He also c/o weakness, fatigue, and worsening back pain. Went for emergent HD d/t significant volume overload.  PMH: ESRD, SVC syndrome, DM2, HTN, COPD  Mr. Armijo was found to be in Afib and pharmacy is consulted to dose warfarin. Baseline INR = 1.36, H/H stable, Plts stable, no s/sx of bleeding noted. Warfarin predictor points = 7  INR is trending up nicely - today 1.74.  Goal of Therapy:  INR 2-3 Monitor platelets by anticoagulation protocol: Yes   Plan:  Will give warfarin 7.5 mg x1 tonight Monitor daily INR, CBC, s/sx of bleeding D/C SQ Heparin when INR >2  Horton Chin, Pharm.D., BCPS Clinical Pharmacist Pager [phone] 05/17/2015 1:38 PM

## 2015-05-17 NOTE — Progress Notes (Signed)
Hemodialysis- Pt signed off treatment with 30 minutes remaining. Dr. Marval Regal at bedside talking to patient about treatment compliance. Pt would not allow bp to be taken x2 during last hour of treatment. Post bp stable. Report given to RN.

## 2015-05-17 NOTE — Progress Notes (Signed)
Spoke with Noah Lewis in HD. She provided update that patient was denied acces at first HD center in [place], and she is awaiting callback from second center to accept him. She stated she would contact them today and call me back about his Clip status. Patient to be discharged to [facility] after he is accepted in HD center in [place].

## 2015-05-18 LAB — GLUCOSE, CAPILLARY
GLUCOSE-CAPILLARY: 78 mg/dL (ref 65–99)
GLUCOSE-CAPILLARY: 98 mg/dL (ref 65–99)

## 2015-05-18 LAB — RENAL FUNCTION PANEL
Albumin: 3.3 g/dL — ABNORMAL LOW (ref 3.5–5.0)
Anion gap: 11 (ref 5–15)
BUN: 25 mg/dL — ABNORMAL HIGH (ref 6–20)
CALCIUM: 8.3 mg/dL — AB (ref 8.9–10.3)
CO2: 28 mmol/L (ref 22–32)
Chloride: 99 mmol/L — ABNORMAL LOW (ref 101–111)
Creatinine, Ser: 6.25 mg/dL — ABNORMAL HIGH (ref 0.61–1.24)
GFR calc Af Amer: 10 mL/min — ABNORMAL LOW (ref >60)
GFR calc non Af Amer: 9 mL/min — ABNORMAL LOW (ref >60)
GLUCOSE: 94 mg/dL (ref 65–99)
POTASSIUM: 4.4 mmol/L (ref 3.5–5.1)
Phosphorus: 3.6 mg/dL (ref 2.5–4.6)
SODIUM: 138 mmol/L (ref 135–145)

## 2015-05-18 LAB — PROTIME-INR
INR: 2.01 — ABNORMAL HIGH (ref 0.00–1.49)
Prothrombin Time: 22.6 seconds — ABNORMAL HIGH (ref 11.6–15.2)

## 2015-05-18 MED ORDER — LABETALOL HCL 200 MG PO TABS
200.0000 mg | ORAL_TABLET | Freq: Three times a day (TID) | ORAL | Status: DC
  Administered 2015-05-18 – 2015-05-22 (×9): 200 mg via ORAL
  Filled 2015-05-18 (×13): qty 1

## 2015-05-18 MED ORDER — WARFARIN SODIUM 7.5 MG PO TABS
7.5000 mg | ORAL_TABLET | Freq: Once | ORAL | Status: AC
  Administered 2015-05-18: 7.5 mg via ORAL
  Filled 2015-05-18: qty 1

## 2015-05-18 MED ORDER — CHLORHEXIDINE GLUCONATE CLOTH 2 % EX PADS
6.0000 | MEDICATED_PAD | Freq: Every day | CUTANEOUS | Status: DC
  Administered 2015-05-19 – 2015-05-21 (×2): 6 via TOPICAL

## 2015-05-18 MED ORDER — MUPIROCIN 2 % EX OINT
TOPICAL_OINTMENT | Freq: Two times a day (BID) | CUTANEOUS | Status: DC
  Administered 2015-05-18 – 2015-05-23 (×8): via NASAL
  Filled 2015-05-18 (×4): qty 22

## 2015-05-18 NOTE — Procedures (Signed)
Pt refuses the use of CPAP this evening.  RT will continue to monitor pt and will placed if requested later.

## 2015-05-18 NOTE — Progress Notes (Signed)
MD on call paged about CMT text page that pt had a 8 beat run of V-tach Vital signs WNL and strip in Epic pt is asymptomatic.Arthor Captain LPN

## 2015-05-18 NOTE — Progress Notes (Signed)
Subjective:  [name] was seen and examined this morning. He had two runs of Vtach last night (5 and 8 beats). He remained asymptomatic, although he did say he feels his heart racing at times.  Other than his persistent back pain, he reported he had no other complaints. He said the heating packs were helping with his back. He completed nearly all of his HD session yesterday, but it was cut short due to his back pain. He said he would have no issues completing HD tomorrow, and for any future session, if his back pain were under control with heating packs.  Said that he had a good night's sleep with the CPAP   Objective: Vital signs in last 24 hours: Filed Vitals:   05/17/15 1152 05/17/15 1155 05/17/15 2215 05/18/15 0538  BP:  121/72 114/80 123/79  Pulse: 79 78 81 81  Temp:   98.3 F (36.8 C) 98.2 F (36.8 C)  TempSrc:   Oral Oral  Resp:  16 20 20   Height:      Weight:  167 lb 8.8 oz (76 kg)  169 lb 12.1 oz (77 kg)  SpO2:  91% 97% 98%   Weight change: 4 lb 10.1 oz (2.1 kg)  Intake/Output Summary (Last 24 hours) at 05/18/15 1311 Last data filed at 05/18/15 0900  Gross per 24 hour  Intake    480 ml  Output      0 ml  Net    480 ml   Physical Exam:  General:  Sitting up in bed, in no acute distress. HEENT:  Pingueculae in both sclera. Facial and upper extremity swelling improved  Cardiac:  Regular rate and rhythm this morning. No murmurs. Dressing over AVF site c/d/i Pulmonary:  Moving normal volumes of air. No wheezes or crackles heard. Abdominal:  +BS. Soft, nontender, nondistended Neuro:  Alert, non-drowsy, and responding appropriately to most questions. Hard of hearing.  Lab Results: Basic Metabolic Panel:  Recent Labs Lab 05/15/15 0900 05/17/15 0828 05/18/15 0705  NA 143 136 138  K 3.6 3.8 4.4  CL 105 98* 99*  CO2 23 25 28   GLUCOSE 118* 148* 94  BUN 68* 40* 25*  CREATININE 11.91* 9.14* 6.25*  CALCIUM 8.0* 8.1* 8.3*  PHOS 7.6*  --  3.6   CBC:  Recent  Labs Lab 05/12/15 2339 05/14/15 1629  WBC 5.9 5.2  HGB 10.3* 10.3*  HCT 31.4* 32.1*  MCV 84.4 84.3  PLT 217 195   CBG:  Recent Labs Lab 05/16/15 0815 05/16/15 1150 05/16/15 1648 05/17/15 1252 05/17/15 1818 05/18/15 0753  GLUCAP 84 120* 121* 93 92 78   Coagulation:  Recent Labs Lab 05/15/15 0900 05/16/15 0720 05/17/15 1258 05/18/15 0705  LABPROT 19.2* 20.1* 20.3* 22.6*  INR 1.61* 1.71* 1.74* 2.01*   \Medications: I have reviewed the patient's current medications. Scheduled Meds: . aspirin  81 mg Oral Daily  . calcium acetate  1,334 mg Oral TID WC  . diclofenac sodium  2 g Topical QID  . doxercalciferol  2 mcg Intravenous Q M,W,F-HD  . labetalol  200 mg Oral 3 times per day  . sodium chloride  3 mL Intravenous Q12H  . warfarin  7.5 mg Oral ONCE-1800  . warfarin   Does not apply Once  . Warfarin - Pharmacist Dosing Inpatient   Does not apply q1800   Continuous Infusions:  PRN Meds:.heparin, heparin, ipratropium-albuterol, lidocaine (PF), lidocaine-prilocaine, pentafluoroprop-tetrafluoroeth Assessment/Plan:  Volume Overload 2/2 ESRD and Hemodialysis Non-Adherence: He denies any  shortness of breath this morning and is in no acute distress. Patient has a clear understanding of the purpose of HD and that non-adherence will lead to death. He is hopeful he can complete his dialysis session if his back pain is more controlled - Apply Voltaren gel to right lower back before HD sessions. Bring heating packs to HD sessions. - A plan is in progress to meet his HD needs upon discharge. He has been denied by one HD center in [place]. As of now, he has a SNF bed at [facility]. - Will call [facility] at [phone] to discuss why he will be an exceptional candidate for dialysis, as long as he has a stable living situation.  Hearing Loss:  This is a significant barrier to care. 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 Was advised by Audiology to contact [facility], Nose, and Throat. Should hear from Noah Lewis, Au.D. regarding donated hearing aids today  New Onset Atrial Fibrillation: Had a couple runs of asymptomatic Vtach on 8/25, although HR sounds regular this morning. Likely secondary to ESRD. Started on coumadin instead of NOACs in setting of ESRD. Warfarin would be a good option if he is discharged to SNF and will have consistent access to HD with consistent INR checks. However, if he is discharged to the street, this would be a poor option and may benefit from ASA as an alternative. INR is now 2.0. - Warfarin per pharmacy recommendations - Increased Labetolol to 200 mg TID in setting of occasional asymptomatic Vtach  Possible Obstructive Sleep Apnea:  Witnessed apneic event during exam on 8/23.  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 Had been adherent with CPAP as an inpatient. Will need sleep study as an outpatient in order to obtain CPAP.  Presumed Bipolar Disorder: Consult to Psychiatrist Dr. Louretta Lewis, and he identifies no clear evidence of bipolar disorder. He has showed no signs of mania or depression, save for the potential suicidal ideation for his admission in early August, which resolved without psychotropics. He does not require any antidepressants or mood stabilizers.  Superior Vena Cava Syndrome: Ongoing issue. Overall is improved from his first admission when his PC was removed. Previous records from HD centers suggest this is due to ongoing upper extremity DVTs, although last admission, upper extremity US was unremarkable. Markedly improved during this hospitalization.  Type 2 Diabetes: No medications. Recent A1c of 5.7. CBGs have been stable (83-110), although has been refusing some CBGs. - CBGs BID only with SSI-S  HTN: Labetolol may have contributed to bradycardia to 39 in setting of afib on 8/20. Dose was decreased to current dose, but was increased due to [place] on 8/25. -  Labetolol 200 mg TID  Homelessness:  Significant barrier to care. Plan for now is to be discharged to SNF - CSW following  Dispo: Disposition is deferred at this time, awaiting improvement of current medical problems.  Anticipated discharge in approximately 2-3 days.  The patient does not have a current PCP (Provider Not In System) and does not need an [facility] hospital follow-up appointment after discharge. He needs to establish care at [facility]  The patient does have transportation limitations that hinder transportation to clinic appointments. He will need access to a HD center on discharge.  .Services Needed at time of discharge: Y = Yes, Blank = No PT: Yes  OT:   RN:   Equipment:   Other: Skilled Nursing  LOS: 5 days   Liberty Handy, MD 05/18/2015, 1:11 PM

## 2015-05-18 NOTE — Progress Notes (Signed)
ANTICOAGULATION CONSULT NOTE - Follow- Up  Pharmacy Consult for warfarin Indication: atrial fibrillation  Allergies  Allergen Reactions  . Penicillins Other (See Comments)    unsure    Patient Measurements: Weight: 79.7 kg Height: 5\' 7"   Vital Signs: Temp: 98.2 F (36.8 C) (08/25 0538) Temp Source: Oral (08/25 0538) BP: 123/79 mmHg (08/25 0538) Pulse Rate: 81 (08/25 0538)  Labs:  Recent Labs  05/16/15 0720 05/17/15 0828 05/17/15 1258 05/18/15 0705  LABPROT 20.1*  --  20.3* 22.6*  INR 1.71*  --  1.74* 2.01*  CREATININE  --  9.14*  --  6.25*    Estimated Creatinine Clearance: 11.3 mL/min (by C-G formula based on Cr of 6.25).   Medical History: Past Medical History  Diagnosis Date  . Cancer   . Arthritis   . Renal disorder   . Hypertension   . Diabetes mellitus without complication   . Asthma   . COPD (chronic obstructive pulmonary disease)   . Coronary artery disease     Assessment: 64 yo M admitted 05/12/2015 presents with c/o worsening SOB. Pt had not been to HD since last admission on 05/03/15. He also c/o weakness, fatigue, and worsening back pain. Went for emergent HD d/t significant volume overload.  PMH: ESRD, SVC syndrome, DM2, HTN, COPD  Noah Lewis was found to be in Afib and pharmacy is consulted to dose warfarin. Baseline INR = 1.36, H/H stable, Plts stable, no s/sx of bleeding noted. Warfarin predictor points = 7  INR is trending up nicely - today 2.  Goal of Therapy:  INR 2-3 Monitor platelets by anticoagulation protocol: Yes   Plan:  Will give warfarin 7.5 mg x1 tonight Monitor daily INR, CBC, s/sx of bleeding D/C SQ Heparin for INR >2  Noah Lewis, Pharm.D., BCPS Clinical Pharmacist Pager [phone] 05/18/2015 11:03 AM

## 2015-05-18 NOTE — Consult Note (Signed)
[facility] Face-to-Face Psychiatry Consult follow-up  Reason for Consult:  Bipolar disorder evaluation and non compliant with medication Referring Physician:  Dr. Lynnae January Patient Identification: Noah Lewis MRN:  [id number] Principal Diagnosis: Bipolar depression Diagnosis:   Patient Active Problem List   Diagnosis Date Noted  . ESRD on hemodialysis [N18.6, Z99.2] 05/13/2015  . New onset atrial fibrillation [I48.91] 05/13/2015  . Volume overload [E87.70] 04/28/2015  . Fluid overload [E87.70] 04/28/2015  . Bipolar depression [F31.30] 04/28/2015  . HTN (hypertension) [I10] 04/28/2015  . Chronic back pain [M54.9, G89.29] 04/28/2015  . Facial swelling [R22.0] 04/28/2015  . Left upper extremity swelling [M79.89]     Total Time spent with patient: 30 minutes  Subjective:   Noah Lewis is a 64 y.o. male patient admitted with non compliant with treatment.  HPI:  Noah Lewis is a 64 y.o. male seen face-to-face for psychiatric consultation and evaluation of mood disorder especially bipolar disorder and noncompliant with the medication management. Patient is known to this provider from his recent admission to the [facility] for noncompliant with hemodialysis, overloaded with fluids and shortness of breath. Patient was discharged after stabilized. Patient maternal aunt who walked into the patient's room contributed with this information. Patient was stayed with her for a few days and then left to [facility] and has no follow-up hemodialysis her medication management since last discharge from the hospital. Reportedly patient cannot go back to his place in [place] secondary to his roommates left the place and he cannot live by himself. Reportedly patient is willing to be placed in assisted living facility after medically stabilizing at this time. Patient has hearing impairment and difficult to follow through. Patient reportedly admitted with generalized weakness,  fatigue, worsening back pain. Patient usually hear better when medically stabilized based on his last hospitalization. Patient called his mother requested emergency medical services but he shortness of breath. Patient cannot stay with his mother because of family issues which was not discussed during this evaluation. Patient has no suicidal or homicidal ideation. Patient does not appear to be responding to internal stimuli. Case discussed with the psychiatric social service and requested to contact the previous and mental health providers her medical providers regarding outpatient medication management. Patient has no clear evidence that he has bipolar disorder at this time. Patient previously [facility] to behavioral [facility] in [facility] and electronic medical records were not available.  HPI Elements:   Location:  Mood disorder noncompliant with medication. Quality:  Poor. Severity:  Shortness of breath and generalized weakness secondary to no treatment. Timing:  Left his place in [facility] about 4 weeks ago. Duration:  4 weeks. Context:  Unknown multiple psychosocial stressors and family issues.  Interval history: Patient seen for psychiatric consultation follow-up today and case discussed with internal medicine resident Dr. Marijean Bravo and psychiatric social service. Patient reported that he has no previous history of acute psychiatric hospitalizations or psychiatric medication management. Patient reported his been normal when he is able to get his hemodialysis and regular basis and has a place to live. Patient denies current suicidal/homicidal ideation, intention or plans. Patient denies current symptoms of depression, anxiety, mania, psychosis and insomnia. Patient is hoping to be placed in a skilled nursing facility with arrangements of hemodialysis 3 times a week in [place].    Past Medical History:  Past Medical History  Diagnosis Date  . Cancer   . Arthritis   . Renal disorder   .  Hypertension   . Diabetes  mellitus without complication   . Asthma   . COPD (chronic obstructive pulmonary disease)   . Coronary artery disease     Past Surgical History  Procedure Laterality Date  . No past surgeries    . Insertion of dialysis catheter     Family History: No family history on file. Social History:  History  Alcohol Use No     History  Drug Use No    Social History   Social History  . Marital Status: Married    Spouse Name: N/A  . Number of Children: N/A  . Years of Education: N/A   Social History Main Topics  . Smoking status: Never Smoker   . Smokeless tobacco: Never Used  . Alcohol Use: No  . Drug Use: No  . Sexual Activity: Not Asked   Other Topics Concern  . None   Social History Narrative   Additional Social History:                          Allergies:   Allergies  Allergen Reactions  . Penicillins Other (See Comments)    unsure    Labs:  Results for orders placed or performed during the hospital encounter of 05/12/15 (from the past 48 hour(s))  Glucose, capillary     Status: Abnormal   Collection Time: 05/16/15 11:50 AM  Result Value Ref Range   Glucose-Capillary 120 (H) 65 - 99 mg/dL  Glucose, capillary     Status: Abnormal   Collection Time: 05/16/15  4:48 PM  Result Value Ref Range   Glucose-Capillary 121 (H) 65 - 99 mg/dL  Basic metabolic panel     Status: Abnormal   Collection Time: 05/17/15  8:28 AM  Result Value Ref Range   Sodium 136 135 - 145 mmol/L   Potassium 3.8 3.5 - 5.1 mmol/L   Chloride 98 (L) 101 - 111 mmol/L   CO2 25 22 - 32 mmol/L   Glucose, Bld 148 (H) 65 - 99 mg/dL   BUN 40 (H) 6 - 20 mg/dL   Creatinine, Ser 9.14 (H) 0.61 - 1.24 mg/dL   Calcium 8.1 (L) 8.9 - 10.3 mg/dL   GFR calc non Af Amer 5 (L) >60 mL/min   GFR calc Af Amer 6 (L) >60 mL/min    Comment: (NOTE) The eGFR has been calculated using the CKD EPI equation. This calculation has not been validated in all clinical  situations. eGFR's persistently <60 mL/min signify possible Chronic Kidney Disease.    Anion gap 13 5 - 15  Glucose, capillary     Status: None   Collection Time: 05/17/15 12:52 PM  Result Value Ref Range   Glucose-Capillary 93 65 - 99 mg/dL  Protime-INR     Status: Abnormal   Collection Time: 05/17/15 12:58 PM  Result Value Ref Range   Prothrombin Time 20.3 (H) 11.6 - 15.2 seconds   INR 1.74 (H) 0.00 - 1.49  Glucose, capillary     Status: None   Collection Time: 05/17/15  6:18 PM  Result Value Ref Range   Glucose-Capillary 92 65 - 99 mg/dL  Protime-INR     Status: Abnormal   Collection Time: 05/18/15  7:05 AM  Result Value Ref Range   Prothrombin Time 22.6 (H) 11.6 - 15.2 seconds   INR 2.01 (H) 0.00 - 1.49  Renal function panel     Status: Abnormal   Collection Time: 05/18/15  7:05 AM  Result Value Ref  Range   Sodium 138 135 - 145 mmol/L   Potassium 4.4 3.5 - 5.1 mmol/L   Chloride 99 (L) 101 - 111 mmol/L   CO2 28 22 - 32 mmol/L   Glucose, Bld 94 65 - 99 mg/dL   BUN 25 (H) 6 - 20 mg/dL   Creatinine, Ser 6.25 (H) 0.61 - 1.24 mg/dL   Calcium 8.3 (L) 8.9 - 10.3 mg/dL   Phosphorus 3.6 2.5 - 4.6 mg/dL   Albumin 3.3 (L) 3.5 - 5.0 g/dL   GFR calc non Af Amer 9 (L) >60 mL/min   GFR calc Af Amer 10 (L) >60 mL/min    Comment: (NOTE) The eGFR has been calculated using the CKD EPI equation. This calculation has not been validated in all clinical situations. eGFR's persistently <60 mL/min signify possible Chronic Kidney Disease.    Anion gap 11 5 - 15    Vitals: Blood pressure 123/79, pulse 81, temperature 98.2 F (36.8 C), temperature source Oral, resp. rate 20, height $RemoveBe'5\' 7"'TRBNvYkIl$  (1.702 m), weight 77 kg (169 lb 12.1 oz), SpO2 98 %.  Risk to Self: Is patient at risk for suicide?: No Risk to Others:   Prior Inpatient Therapy:   Prior Outpatient Therapy:    Current Facility-Administered Medications  Medication Dose Route Frequency Provider Last Rate Last Dose  . aspirin chewable  tablet 81 mg  81 mg Oral Daily Corky Sox, MD   81 mg at 05/16/15 1013  . calcium acetate (PHOSLO) capsule 1,334 mg  1,334 mg Oral TID WC Donato Heinz, MD   1,334 mg at 05/17/15 1700  . diclofenac sodium (VOLTAREN) 1 % transdermal gel 2 g  2 g Topical QID Liberty Handy, MD   2 g at 05/17/15 2231  . doxercalciferol (HECTOROL) injection 2 mcg  2 mcg Intravenous Q M,W,F-HD Donato Heinz, MD   2 mcg at 05/17/15 1212  . heparin injection 1,000 Units  1,000 Units Dialysis PRN Dwana Melena, MD      . heparin injection 1,000 Units  1,000 Units Dialysis PRN Dwana Melena, MD      . ipratropium-albuterol (DUONEB) 0.5-2.5 (3) MG/3ML nebulizer solution 3 mL  3 mL Nebulization Q6H PRN Corky Sox, MD      . labetalol (NORMODYNE) tablet 200 mg  200 mg Oral 3 times per day Liberty Handy, MD      . lidocaine (PF) (XYLOCAINE) 1 % injection 5 mL  5 mL Intradermal PRN Dwana Melena, MD      . lidocaine-prilocaine (EMLA) cream 1 application  1 application Topical PRN Dwana Melena, MD      . pentafluoroprop-tetrafluoroeth Landry Dyke) aerosol 1 application  1 application Topical PRN Dwana Melena, MD      . sodium chloride 0.9 % injection 3 mL  3 mL Intravenous Q12H Corky Sox, MD   3 mL at 05/17/15 2234  . warfarin (COUMADIN) tablet 7.5 mg  7.5 mg Oral ONCE-1800 Kimberly B Hammons, RPH      . warfarin (COUMADIN) video   Does not apply Once [employer], [facility]      . Warfarin - Pharmacist Dosing Inpatient   Does not apply [place], [facility]   1 each at 05/16/15 1726    Musculoskeletal: Strength & Muscle Tone: decreased Gait & Station: unable to stand Patient leans: N/A  Psychiatric Specialty Exam: Physical Exam    ROS   Blood pressure 123/79, pulse 81, temperature 98.2 F (36.8 C),  temperature source Oral, resp. rate 20, height $RemoveBe'5\' 7"'YnBZaFyzr$  (1.702 m), weight 77 kg (169 lb 12.1 oz), SpO2 98 %.Body mass index is 26.58 kg/(m^2).  General Appearance: Casual  Eye Contact::  Fair  Speech:  Clear and Coherent   Volume:  Normal  Mood:  Euthymic  A  Appropriate and Congruent  Thought Process:  Coherent  Orientation:  Full (Time, Place, and Person)  Thought Content:  WDL  Suicidal Thoughts:  No  Homicidal Thoughts:  No  Memory:  Immediate;   Fair Recent;   Poor  Judgement:  Intact  Insight:  Fair  Psychomotor Activity:  Decreased  Concentration:  Fair  Recall:  [employer] of Knowledge:Fair  Language: Fair  Akathisia:  Negative  Handed:  Right  AIMS (if indicated):     Assets:  Communication Skills Desire for Improvement Leisure Time Resilience Social Support  ADL's:  Impaired  Cognition: Impaired,  Mild  Sleep:      Medical Decision Making: Review of Psycho-Social Stressors (1), Review or order clinical lab tests (1), Established Problem, Worsening (2), Review of Last Therapy Session (1), Review or order medicine tests (1), Review of Medication Regimen & Side Effects (2) and Review of New Medication or Change in Dosage (2)  Treatment Plan Summary: Patient with multiple medical problems including end-stage renal disease and on dialysis but noncompliant with the treatment since discharge from the hospital about 10 days ago. Patient family cannot provide shelter to him but supportive and communicated to him. Patient can contract for safety while in the hospital. Daily contact with patient to assess and evaluate symptoms and progress in treatment and Medication management  Plan: Psychiatric clinical social service will contact outpatient providers and renal dialysis center regarding his outpatient medication management for behavioral health as patient was not able to remember names of the specific medications.  Patient has no safety concerns Patient does not meet criteria for psychiatric inpatient admission. Supportive therapy provided about ongoing stressors. Appreciate psychiatric consultation and we sign off at this time Please contact [phone] or [phone] if needs further  assistance   Disposition: Patient benefit from assisted living facility because patient has no place to live and unable to secure necessary medication treatment and hemodialysis on his own since discharge from the hospital about a 10 days ago.  Bexlee Bergdoll,JANARDHAHA R. 05/18/2015 11:26 AM

## 2015-05-19 DIAGNOSIS — E8770 Fluid overload, unspecified: Secondary | ICD-10-CM

## 2015-05-19 DIAGNOSIS — Z992 Dependence on renal dialysis: Secondary | ICD-10-CM

## 2015-05-19 LAB — PROTIME-INR
INR: 2.46 — ABNORMAL HIGH (ref 0.00–1.49)
Prothrombin Time: 26.4 seconds — ABNORMAL HIGH (ref 11.6–15.2)

## 2015-05-19 LAB — RENAL FUNCTION PANEL
ANION GAP: 13 (ref 5–15)
Albumin: 3.3 g/dL — ABNORMAL LOW (ref 3.5–5.0)
BUN: 40 mg/dL — ABNORMAL HIGH (ref 6–20)
CALCIUM: 8.4 mg/dL — AB (ref 8.9–10.3)
CO2: 24 mmol/L (ref 22–32)
Chloride: 97 mmol/L — ABNORMAL LOW (ref 101–111)
Creatinine, Ser: 7.93 mg/dL — ABNORMAL HIGH (ref 0.61–1.24)
GFR calc non Af Amer: 6 mL/min — ABNORMAL LOW (ref >60)
GFR, EST AFRICAN AMERICAN: 7 mL/min — AB (ref >60)
Glucose, Bld: 89 mg/dL (ref 65–99)
Phosphorus: 4.6 mg/dL (ref 2.5–4.6)
Potassium: 4.8 mmol/L (ref 3.5–5.1)
Sodium: 134 mmol/L — ABNORMAL LOW (ref 135–145)

## 2015-05-19 LAB — GLUCOSE, CAPILLARY: Glucose-Capillary: 85 mg/dL (ref 65–99)

## 2015-05-19 MED ORDER — WARFARIN SODIUM 5 MG PO TABS
5.0000 mg | ORAL_TABLET | Freq: Once | ORAL | Status: AC
  Administered 2015-05-19: 5 mg via ORAL
  Filled 2015-05-19: qty 1

## 2015-05-19 MED ORDER — POLYETHYLENE GLYCOL 3350 17 G PO PACK
17.0000 g | PACK | Freq: Every day | ORAL | Status: DC | PRN
  Administered 2015-05-19: 17 g via ORAL
  Filled 2015-05-19: qty 1

## 2015-05-19 NOTE — Progress Notes (Signed)
Patient ID: Noah Lewis, male   DOB: 04/21/1951, 64 y.o.   MRN: [id number]  [facility] Progress Note    Subjective:   No complaints   Objective:   BP 129/82 mmHg  Pulse 80  Temp(Src) 98.5 F (36.9 C) (Oral)  Resp 18  Ht 5\' 7"  (1.702 m)  Wt 77.7 kg (171 lb 4.8 oz)  BMI 26.82 kg/m2  SpO2 96%  Intake/Output: I/O last 3 completed shifts: In: 240 [P.O.:240] Out: -    Intake/Output this shift:  Total I/O In: 240 [P.O.:240] Out: -  Weight change: -1.6 kg (-3 lb 8.4 oz)  Physical Exam: Gen:WD AAM in NAD CVS:no rub Resp:occ rhonchi Abd:+BS, soft, NT/ND Ext:no edema, LUE AVF +T/B  Labs: BMET  Recent Labs Lab 05/12/15 2113  05/12/15 2339 05/13/15 0755 05/14/15 1032 05/15/15 0900 05/17/15 0828 05/18/15 0705 05/19/15 0559  NA 141  < > 140 139 141 143 136 138 134*  K 4.1  < > 4.3 4.3 3.7 3.6 3.8 4.4 4.8  CL 105  < > 106 102 103 105 98* 99* 97*  CO2 17*  --  15* 22 23 23 25 28 24   GLUCOSE 142*  < > 89 79 152* 118* 148* 94 89  BUN 90*  < > 91* 48* 57* 68* 40* 25* 40*  CREATININE 14.78*  < > 14.49* 9.18* 10.56* 11.91* 9.14* 6.25* 7.93*  ALBUMIN 3.7  --  3.6 3.4* 3.1* 3.2*  --  3.3* 3.3*  CALCIUM 8.2*  --  8.2* 8.6* 8.2* 8.0* 8.1* 8.3* 8.4*  PHOS  --   --  8.6* 5.3*  5.4* 7.2* 7.6*  --  3.6 4.6  < > = values in this interval not displayed. CBC  Recent Labs Lab 05/12/15 2113 05/12/15 2127 05/12/15 2339 05/14/15 1629  WBC 6.7  --  5.9 5.2  HGB 10.4* 11.6* 10.3* 10.3*  HCT 32.1* 34.0* 31.4* 32.1*  MCV 85.1  --  84.4 84.3  PLT 205  --  217 195    @IMGRELPRIORS @ Medications:    . aspirin  81 mg Oral Daily  . calcium acetate  1,334 mg Oral TID WC  . Chlorhexidine Gluconate Cloth  6 each Topical Q0600  . diclofenac sodium  2 g Topical QID  . doxercalciferol  2 mcg Intravenous Q M,W,F-HD  . labetalol  200 mg Oral 3 times per day  . mupirocin ointment   Nasal BID  . sodium chloride  3 mL Intravenous Q12H  . warfarin  5 mg Oral ONCE-1800   . warfarin   Does not apply Once  . Warfarin - Pharmacist Dosing Inpatient   Does not apply q1800   Dialyzes at [place] 80 kg. HD Bath 3K, 3calc, Dialyzer 200, Heparin none. Access orders to cannulate AVF since April- been refusing. hectorol 2 mcg per tx- does not look like he is on ESA  Assessment/ Plan:   1. SOB- due to noncompliance with hemodialysis and volume overload. Improved with HD   2. ESRD: nonadherent with HD at his home unit in [facility] and is only getting sporadic treatments. Several barriers include bipolar disorder and hearing loss. Appreciate SW/CM assistance in finding SNF placement to help with outpt HD compliance as well as psychiatric issues 1. Doing better with HD and mood and now will be going to [facility] so transportation will not be an issue moving forward 2. Await outpt HD schedule 3. If he refuses HD again tomorrow he will  not be accepted here in [place] for dialysis and will need to wait for a SNF in [facility] 4. Plan for HD tomorrow to get on TTS schedule which will be his likely outpt schedule 3. 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 calcium acetate and follow 5. Nutrition:per primary 6. Hypertension:stable 7. Vascular access- had TDC removed and using L AVF 8. Disposition- appreciate SW/CM assistance and now has room at [facility]. Awaiting outpt HD spot. Unclear why he is not going back to [facility] where he already has an established HD center. If he refuses HD again while an inpatient, he will not be accepted to any units here in [place]. 9. Bipolar disorder- stable but needs to follow up with Psych.  He may also benefit from medications which were supposed to have been followed up by Psychiatry on 05/02/15 and still no mention or prescription for medications related to his Bipolar Disorder. This needs to be addressed before discharge to help prevent readmission. 10. Hearing loss- would  greatly benefit from audiology eval and possible hearing aids. Etiology of hearing loss unknown.  Sidra Oldfield A 05/19/2015, 1:51 PM

## 2015-05-19 NOTE — Progress Notes (Signed)
Physical Therapy Treatment Patient Details Name: Noah Lewis MRN: [id number] DOB: 1951-06-09 Today's Date: 2015/05/31    History of Present Illness 64 Y O M with PMH of ESRD, HTN, Bipolar disorder, cardiac disease- admitted with shortness of breath  and fluid overload after missing HD     PT Comments    Pt making slow progress.  Follow Up Recommendations  SNF     Equipment Recommendations  None recommended by PT    Recommendations for Other Services       Precautions / Restrictions Precautions Precautions: Fall Restrictions Weight Bearing Restrictions: No    Mobility  Bed Mobility                  Transfers Overall transfer level: Needs assistance Equipment used: Straight cane   Sit to Stand: Supervision         General transfer comment: assist for safety  Ambulation/Gait Ambulation/Gait assistance: Min assist Ambulation Distance (Feet): 175 Feet Assistive device: Straight cane;1 person hand held assist Gait Pattern/deviations: Step-through pattern;Decreased stride length   Gait velocity interpretation: Below normal speed for age/gender General Gait Details: Assist for balance   Stairs            Wheelchair Mobility    Modified Rankin (Stroke Patients Only)       Balance   Sitting-balance support: No upper extremity supported;Feet supported Sitting balance-Leahy Scale: Good     Standing balance support: No upper extremity supported Standing balance-Leahy Scale: Fair                      Cognition Arousal/Alertness: Awake/alert Behavior During Therapy: Flat affect Overall Cognitive Status: No family/caregiver present to determine baseline cognitive functioning                      Exercises      General Comments        Pertinent Vitals/Pain Pain Assessment: Faces Faces Pain Scale: Hurts a little bit Pain Location: back Pain Descriptors / Indicators: Guarding Pain Intervention(s): Limited activity  within patient's tolerance;Repositioned    Home Living                      Prior Function            PT Goals (current goals can now be found in the care plan section) Acute Rehab PT Goals Patient Stated Goal: did not state PT Goal Formulation: Patient unable to participate in goal setting Time For Goal Achievement: 05/28/15 Potential to Achieve Goals: Fair Progress towards PT goals: Progressing toward goals    Frequency  Min 2X/week    PT Plan Current plan remains appropriate    Co-evaluation             End of Session   Activity Tolerance: Patient tolerated treatment well Patient left: with call bell/phone within reach;in bed (camera room, sitting EOB)     Time: 0347-4259 PT Time Calculation (min) (ACUTE ONLY): 10 min  Charges:  $Gait Training: 8-22 mins                    G Codes:      Zian Mohamed 05-31-2015, 1:22 PM  [employer] PT [phone]

## 2015-05-19 NOTE — Clinical Social Work Note (Addendum)
APS social worker Ernest Pine [phone] came to hospital to see the patient. CSW provided APS social worker with requested documentation.    Liz Beach MSW, [place], [place], [phone]

## 2015-05-19 NOTE — Procedures (Signed)
Pt refuses CPAP for this evening.

## 2015-05-19 NOTE — Progress Notes (Signed)
Subjective:  Noah Lewis was seen and examined this morning. While he did not use the CPAP last night, he reports he had a good night's sleep. If he is put on the schedule for HD today, he said he would absolutely go for the full duration if he is able to get the heating packs for his back.   Objective: Vital signs in last 24 hours: Filed Vitals:   05/18/15 0538 05/18/15 1318 05/18/15 2122 05/19/15 0516  BP: 123/79 128/85 127/86 129/82  Pulse: 81 71 87 80  Temp: 98.2 F (36.8 C) 98.3 F (36.8 C) 98.3 F (36.8 C) 98.5 F (36.9 C)  TempSrc: Oral Oral Oral Oral  Resp: 20 20 22 18   Height:      Weight: 169 lb 12.1 oz (77 kg)   171 lb 4.8 oz (77.7 kg)  SpO2: 98% 100% 98% 96%   Weight change: -3 lb 8.4 oz (-1.6 kg) No intake or output data in the 24 hours ending 05/19/15 0932 Physical Exam:  General:  Sitting up in bed, in no acute distress. HEENT:  Pingueculae in both sclera. Facial and upper extremity swelling improved  Cardiac:  Regular rate and rhythm this morning. No murmurs.  Pulmonary:  Moving normal volumes of air. No wheezes or crackles heard. Abdominal:  +BS. Soft, nontender, nondistended Neuro:  Alert, non-drowsy, and responding appropriately to most questions. Hard of hearing.  Lab Results: Basic Metabolic Panel:  Recent Labs Lab 05/18/15 0705 05/19/15 0559  NA 138 134*  K 4.4 4.8  CL 99* 97*  CO2 28 24  GLUCOSE 94 89  BUN 25* 40*  CREATININE 6.25* 7.93*  CALCIUM 8.3* 8.4*  PHOS 3.6 4.6   CBC:  Recent Labs Lab 05/12/15 2339 05/14/15 1629  WBC 5.9 5.2  HGB 10.3* 10.3*  HCT 31.4* 32.1*  MCV 84.4 84.3  PLT 217 195   CBG:  Recent Labs Lab 05/16/15 1648 05/17/15 1252 05/17/15 1818 05/18/15 0753 05/18/15 2121 05/19/15 0743  GLUCAP 121* 93 92 78 98 85   Coagulation:  Recent Labs Lab 05/16/15 0720 05/17/15 1258 05/18/15 0705 05/19/15 0559  LABPROT 20.1* 20.3* 22.6* 26.4*  INR 1.71* 1.74* 2.01* 2.46*   \Medications: I have reviewed  the patient's current medications. Scheduled Meds: . aspirin  81 mg Oral Daily  . calcium acetate  1,334 mg Oral TID WC  . Chlorhexidine Gluconate Cloth  6 each Topical Q0600  . diclofenac sodium  2 g Topical QID  . doxercalciferol  2 mcg Intravenous Q M,W,F-HD  . labetalol  200 mg Oral 3 times per day  . mupirocin ointment   Nasal BID  . sodium chloride  3 mL Intravenous Q12H  . warfarin  5 mg Oral ONCE-1800  . warfarin   Does not apply Once  . Warfarin - Pharmacist Dosing Inpatient   Does not apply q1800   Continuous Infusions:  PRN Meds:.heparin, heparin, ipratropium-albuterol, lidocaine (PF), lidocaine-prilocaine, pentafluoroprop-tetrafluoroeth Assessment/Plan:  Volume Overload 2/2 ESRD and Hemodialysis Non-Adherence: With proper control of his back pain, patient indicates he will be adherent with hemodialysis. I spoke with the coordinator at [name], and suggested that since his living situation will be more stable with the SNF bed, he should be adherent with dialysis. - Apply Voltaren gel to right lower back before HD sessions. Bring heating packs to HD sessions. - A plan is in progress to meet his HD needs upon discharge.  As of now, he has a SNF bed at  [place].  Hearing Loss:  This is a significant barrier to care. 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 Was advised by Audiology to contact [facility], Nose, and Throat. Should hear from [facility], Au.D. regarding donated hearing aids, however have not heard back. This may be an issue to be addressed at his SNF if we are unable to obtain them by discharge.  New Onset Atrial Fibrillation: No VTach overnight. Started on coumadin instead of NOACs in setting of ESRD. Warfarin would be a good option if he is discharged to SNF and will have consistent access to HD with consistent INR checks. However, if he is discharged to the street, this would be a poor  option and may benefit from ASA as an alternative. INR is now 2.46. - Warfarin per pharmacy recommendations - Labetolol to 200 mg TID  Possible Obstructive Sleep Apnea:  Witnessed apneic event during exam on 8/23.  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 Had been adherent with CPAP as an inpatient. Will need sleep study as an outpatient in order to obtain CPAP.  Presumed Bipolar Disorder: Consult to Psychiatrist Dr. Louretta Shorten, and he identifies no clear evidence of bipolar disorder. He has showed no signs of mania or depression, save for the potential suicidal ideation for his admission in early August, which resolved without psychotropics. He does not require any antidepressants or mood stabilizers.  Superior Vena Cava Syndrome: Ongoing issue. Overall is improved from his first admission when his PC was removed. Previous records from HD centers suggest this is due to ongoing upper extremity DVTs, although last admission, upper extremity US was unremarkable. Markedly improved during this hospitalization.  Type 2 Diabetes: No medications. Recent A1c of 5.7. CBGs have been stable (83-110), although has been refusing some CBGs. - CBGs BID only with SSI-S  HTN: Labetolol may have contributed to bradycardia to 39 in setting of afib on 8/20. Dose was decreased to current dose, but was increased due to [place] on 8/25. - Labetolol 200 mg TID  Homelessness:  Significant barrier to care. Plan for now is to be discharged to SNF - CSW following  Dispo: Disposition is deferred at this time, awaiting improvement of current medical problems.  Anticipated discharge in approximately 1-2 days.  The patient does not have a current PCP (Provider Not In System) and does not need an [facility] hospital follow-up appointment after discharge. He needs to establish care at [facility]  The patient does have transportation limitations that hinder transportation to clinic appointments. He will need access to a HD  center on discharge.  .Services Needed at time of discharge: Y = Yes, Blank = No PT: Yes  OT:   RN:   Equipment:   Other: Skilled Nursing    LOS: 6 days   Liberty Handy, MD 05/19/2015, 9:32 AM

## 2015-05-19 NOTE — Clinical Social Work Note (Signed)
CSW spoke with HD unit today in regards to patient's HD being set up in [place]. This has not been accomplished at this time as patient's non-compliance is presenting a barrier. Unfortunately, he does not have a bed in [facility] and will not be transported to [facility] for dialysis by [facility]. A discussion with patient regarding the ramifications of his HD non-compliance needs to be had as [place] facilities have stated they will NOT be offering a bed for the patient. CSW will leave report for weekend CSW.   Liz Beach MSW, [place], [place], [phone]

## 2015-05-19 NOTE — Progress Notes (Signed)
ANTICOAGULATION CONSULT NOTE - Follow- Up  Pharmacy Consult for warfarin Indication: atrial fibrillation  Allergies  Allergen Reactions  . Penicillins Other (See Comments)    unsure    Patient Measurements: Weight: 79.7 kg Height: 5\' 7"   Vital Signs: Temp: 98.5 F (36.9 C) (08/26 0516) Temp Source: Oral (08/26 0516) BP: 129/82 mmHg (08/26 0516) Pulse Rate: 80 (08/26 0516)  Labs:  Recent Labs  05/17/15 0828 05/17/15 1258 05/18/15 0705 05/19/15 0559  LABPROT  --  20.3* 22.6* 26.4*  INR  --  1.74* 2.01* 2.46*  CREATININE 9.14*  --  6.25* 7.93*    Estimated Creatinine Clearance: 8.9 mL/min (by C-G formula based on Cr of 7.93).   Medical History: Past Medical History  Diagnosis Date  . Cancer   . Arthritis   . Renal disorder   . Hypertension   . Diabetes mellitus without complication   . Asthma   . COPD (chronic obstructive pulmonary disease)   . Coronary artery disease     Assessment: 64 yo M admitted 05/12/2015 presents with c/o worsening SOB. Pt had not been to HD since last admission on 05/03/15. He also c/o weakness, fatigue, and worsening back pain. Went for emergent HD d/t significant volume overload.  PMH: ESRD, SVC syndrome, DM2, HTN, COPD  Noah Lewis was found to be in Afib and pharmacy is consulted to dose warfarin. Baseline INR = 1.36, H/H stable, Plts stable, no s/sx of bleeding noted. Warfarin predictor points = 7  INR today therapeutic up to 2>>2.46.  No new CBC today. No s/sx of bleeding noted.  Goal of Therapy:  INR 2-3 Monitor platelets by anticoagulation protocol: Yes   Plan:  - Warfarin 5 mg x1 tonight - Monitor daily INR, s/sx of bleeding   Elicia Lamp, PharmD Clinical Pharmacist Pager [phone] 05/19/2015 8:49 AM

## 2015-05-20 LAB — RENAL FUNCTION PANEL
Albumin: 3.2 g/dL — ABNORMAL LOW (ref 3.5–5.0)
Anion gap: 10 (ref 5–15)
BUN: 54 mg/dL — AB (ref 6–20)
CALCIUM: 8.7 mg/dL — AB (ref 8.9–10.3)
CHLORIDE: 98 mmol/L — AB (ref 101–111)
CO2: 27 mmol/L (ref 22–32)
CREATININE: 9.24 mg/dL — AB (ref 0.61–1.24)
GFR calc Af Amer: 6 mL/min — ABNORMAL LOW (ref >60)
GFR calc non Af Amer: 5 mL/min — ABNORMAL LOW (ref >60)
GLUCOSE: 90 mg/dL (ref 65–99)
Phosphorus: 5.1 mg/dL — ABNORMAL HIGH (ref 2.5–4.6)
Potassium: 5.1 mmol/L (ref 3.5–5.1)
SODIUM: 135 mmol/L (ref 135–145)

## 2015-05-20 LAB — CBC
HEMATOCRIT: 31.9 % — AB (ref 39.0–52.0)
Hemoglobin: 10.4 g/dL — ABNORMAL LOW (ref 13.0–17.0)
MCH: 27.4 pg (ref 26.0–34.0)
MCHC: 32.6 g/dL (ref 30.0–36.0)
MCV: 84.2 fL (ref 78.0–100.0)
Platelets: 178 10*3/uL (ref 150–400)
RBC: 3.79 MIL/uL — ABNORMAL LOW (ref 4.22–5.81)
RDW: 17.1 % — AB (ref 11.5–15.5)
WBC: 5.2 10*3/uL (ref 4.0–10.5)

## 2015-05-20 LAB — GLUCOSE, CAPILLARY
Glucose-Capillary: 97 mg/dL (ref 65–99)
Glucose-Capillary: 83 mg/dL (ref 65–99)

## 2015-05-20 LAB — PROTIME-INR
INR: 2.77 — ABNORMAL HIGH (ref 0.00–1.49)
Prothrombin Time: 28.8 seconds — ABNORMAL HIGH (ref 11.6–15.2)

## 2015-05-20 MED ORDER — WARFARIN SODIUM 5 MG PO TABS
5.0000 mg | ORAL_TABLET | Freq: Once | ORAL | Status: AC
  Administered 2015-05-20: 5 mg via ORAL
  Filled 2015-05-20: qty 1

## 2015-05-20 NOTE — Progress Notes (Signed)
Pt continuing to refuse cpap.

## 2015-05-20 NOTE — Progress Notes (Addendum)
ANTICOAGULATION CONSULT NOTE - Follow Up Consult  Pharmacy Consult for Warfarin  Indication: atrial fibrillation  Allergies  Allergen Reactions  . Penicillins Other (See Comments)    unsure    Patient Measurements: Height: 5\' 7"  (170.2 cm) Weight: 173 lb 15.1 oz (78.9 kg) IBW/kg (Calculated) : 66.1   Vital Signs: Temp: 97.7 F (36.5 C) (08/27 0503) Temp Source: Oral (08/27 0503) BP: 117/71 mmHg (08/27 0503) Pulse Rate: 79 (08/27 0503)  Labs:  Recent Labs  05/18/15 0705 05/19/15 0559 05/20/15 0517  LABPROT 22.6* 26.4* 28.8*  INR 2.01* 2.46* 2.77*  CREATININE 6.25* 7.93* 9.24*    Estimated Creatinine Clearance: 7.7 mL/min (by C-G formula based on Cr of 9.24).   Medications:  Scheduled:  . aspirin  81 mg Oral Daily  . calcium acetate  1,334 mg Oral TID WC  . Chlorhexidine Gluconate Cloth  6 each Topical Q0600  . diclofenac sodium  2 g Topical QID  . doxercalciferol  2 mcg Intravenous Q M,W,F-HD  . labetalol  200 mg Oral 3 times per day  . mupirocin ointment   Nasal BID  . sodium chloride  3 mL Intravenous Q12H  . warfarin   Does not apply Once  . Warfarin - Pharmacist Dosing Inpatient   Does not apply q1800   Infusions:   PRN: heparin, heparin, ipratropium-albuterol, lidocaine (PF), lidocaine-prilocaine, pentafluoroprop-tetrafluoroeth, polyethylene glycol  Assessment: 64 yo M admitted 05/12/2015 presents with c/o worsening SOB. Pt had not been to HD since last admission on 05/03/15. He also c/o weakness, fatigue, and worsening back pain. Went for emergent HD d/t significant volume overload.  PMH: ESRD, SVC syndrome, DM2, HTN, COPD  Mr. Brogden was found to be in Afib and pharmacy is consulted to dose warfarin. Baseline INR = 1.36, H/H stable, Plts stable, no s/sx of bleeding noted. Warfarin predictor points = 7  INR today therapeutic up to 2>>2.46>2.77. No new CBC today. No s/sx of bleeding noted.  Goal of Therapy:  INR 2-3 Monitor platelets by  anticoagulation protocol: Yes   Plan:  - Warfarin 5 mg x1 tonight - Monitor daily INR, CBC, s/sx of bleeding  Bennye Alm, PharmD Pharmacy Resident [phone]

## 2015-05-20 NOTE — Progress Notes (Signed)
Subjective:  [name] was seen and examined this morning.  He was sitting on side of bed eating breakfast and reportedly refused transport to HD because they came to get him before breakfast.  He agrees to go today after he has eaten.   Objective: Vital signs in last 24 hours: Filed Vitals:   05/19/15 0516 05/19/15 1412 05/19/15 2230 05/20/15 0503  BP: 129/82 131/86 145/88 117/71  Pulse: 80 81 89 79  Temp: 98.5 F (36.9 C) 97.7 F (36.5 C) 97.6 F (36.4 C) 97.7 F (36.5 C)  TempSrc: Oral Oral Oral Oral  Resp: 18 19 22 22   Height:      Weight: 171 lb 4.8 oz (77.7 kg)   173 lb 15.1 oz (78.9 kg)  SpO2: 96%  98% 100%   Weight change: 2 lb 10.3 oz (1.2 kg)  Intake/Output Summary (Last 24 hours) at 05/20/15 0744 Last data filed at 05/20/15 0507  Gross per 24 hour  Intake    480 ml  Output     50 ml  Net    430 ml   Physical Exam:  General:  Sitting up in bed on side of bed eating eggs, in no acute distress. HEENT:  Pingueculae in both sclera. Facial and upper extremity swelling improved  Cardiac:  Irregular, no M/R/G Pulmonary:  CTA B/L, no W/R/R Abdominal:  +BS. Soft, nontender, nondistended Ext:  LUE AVF + thrill Neuro:  Awake and alert, responding appropriately to questions. Hard of hearing.  Lab Results: Basic Metabolic Panel:  Recent Labs Lab 05/19/15 0559 05/20/15 0517  NA 134* 135  K 4.8 5.1  CL 97* 98*  CO2 24 27  GLUCOSE 89 90  BUN 40* 54*  CREATININE 7.93* 9.24*  CALCIUM 8.4* 8.7*  PHOS 4.6 5.1*   CBG:  Recent Labs Lab 05/17/15 1252 05/17/15 1818 05/18/15 0753 05/18/15 2121 05/19/15 0743 05/20/15 0750  GLUCAP 93 92 78 98 85 83   Coagulation:  Recent Labs Lab 05/17/15 1258 05/18/15 0705 05/19/15 0559 05/20/15 0517  LABPROT 20.3* 22.6* 26.4* 28.8*  INR 1.74* 2.01* 2.46* 2.77*   \Medications: I have reviewed the patient's current medications. Scheduled Meds: . aspirin  81 mg Oral Daily  . calcium acetate  1,334 mg Oral TID WC    . Chlorhexidine Gluconate Cloth  6 each Topical Q0600  . diclofenac sodium  2 g Topical QID  . doxercalciferol  2 mcg Intravenous Q M,W,F-HD  . labetalol  200 mg Oral 3 times per day  . mupirocin ointment   Nasal BID  . sodium chloride  3 mL Intravenous Q12H  . warfarin   Does not apply Once  . Warfarin - Pharmacist Dosing Inpatient   Does not apply q1800   Continuous Infusions: none PRN Meds:.heparin, heparin, ipratropium-albuterol, lidocaine (PF), lidocaine-prilocaine, pentafluoroprop-tetrafluoroeth, polyethylene glycol   Assessment/Plan:  Volume Overload 2/2 ESRD and Hemodialysis Non-Adherence: plan for TTHS schedule. Appreciate nephro assistance and patience.  Called to inpatient HD and they will try and get him to HD this afternoon since he would not go this AM until he ate breakfast. - HD today - A plan is in progress to meet his HD needs upon discharge.  As of now, he has a SNF bed at [facility].  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 Was advised by Audiology to contact [facility], Nose, and Throat. Should hear from Tonia Ghent, Au.D. regarding donated hearing aids, however have  not heard back. This may be an issue to be addressed at his SNF if we are unable to obtain them by discharge.  New Onset Atrial Fibrillation: INR therapeutic on coumadin.  Rate controlled on BB. - Warfarin per pharmacy recommendations - continue Labetolol to 200 mg TID  Possible Obstructive Sleep Apnea:  Will need sleep study as an outpatient in order to obtain CPAP. - intermittently using CPAP  Presumed Bipolar Disorder: No signs/symptoms of Bipolar.  Appreciate psych input.  He does not appear to require any antidepressants or mood stabilizers this time.  Psych social work will follow for med management.  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  Type 2 Diabetes: No medications. Recent A1c of  5.7. CBGs have been stable, although has been refusing some CBGs. - CBGs BID only with SSI-S  HTN: stable.  - continue Labetolol 200 mg TID  Homelessness:  Significant barrier to care. Plan for now is to be discharged to SNF. - CSW following  Dispo: Disposition is deferred at this time, awaiting improvement of current medical problems.  Anticipated discharge in approximately 1-2 days.  The patient does not have a current PCP (Provider Not In System) and does not need an [facility] hospital follow-up appointment after discharge. He needs to establish care at [facility]  The patient does have transportation limitations that hinder transportation to clinic appointments. He will need access to a HD center on discharge.  .Services Needed at time of discharge: Y = Yes, Blank = No PT: Yes  OT:   RN:   Equipment:   Other: Skilled Nursing    LOS: 7 days   Francesca Oman, DO 05/20/2015, 7:44 AM

## 2015-05-20 NOTE — Progress Notes (Signed)
Paged and spoke with MD Melburn Hake notifying him of patient increased creatinine.

## 2015-05-20 NOTE — Procedures (Signed)
Patient was seen on dialysis and the procedure was supervised. BFR 400 [address] L AVF BP is 117/71.  Patient appears to be tolerating treatment well

## 2015-05-21 LAB — CBC
HEMATOCRIT: 34.8 % — AB (ref 39.0–52.0)
HEMOGLOBIN: 11 g/dL — AB (ref 13.0–17.0)
MCH: 27.3 pg (ref 26.0–34.0)
MCHC: 31.6 g/dL (ref 30.0–36.0)
MCV: 86.4 fL (ref 78.0–100.0)
Platelets: 149 10*3/uL — ABNORMAL LOW (ref 150–400)
RBC: 4.03 MIL/uL — ABNORMAL LOW (ref 4.22–5.81)
RDW: 17.1 % — ABNORMAL HIGH (ref 11.5–15.5)
WBC: 5.2 10*3/uL (ref 4.0–10.5)

## 2015-05-21 LAB — GLUCOSE, CAPILLARY
GLUCOSE-CAPILLARY: 97 mg/dL (ref 65–99)
GLUCOSE-CAPILLARY: 115 mg/dL — AB (ref 65–99)
Glucose-Capillary: 102 mg/dL — ABNORMAL HIGH (ref 65–99)

## 2015-05-21 LAB — PROTIME-INR
INR: 2.63 — AB (ref 0.00–1.49)
Prothrombin Time: 27.7 seconds — ABNORMAL HIGH (ref 11.6–15.2)

## 2015-05-21 MED ORDER — WARFARIN SODIUM 5 MG PO TABS
5.0000 mg | ORAL_TABLET | Freq: Once | ORAL | Status: AC
Start: 2015-05-21 — End: 2015-05-21
  Administered 2015-05-21: 5 mg via ORAL
  Filled 2015-05-21: qty 1

## 2015-05-21 MED ORDER — CAPSAICIN 0.025 % EX CREA
TOPICAL_CREAM | Freq: Two times a day (BID) | CUTANEOUS | Status: DC
  Administered 2015-05-21 – 2015-05-22 (×3): via TOPICAL
  Filled 2015-05-21 (×2): qty 56.6

## 2015-05-21 MED ORDER — CAPSAICIN 0.025 % EX CREA: TOPICAL_CREAM | Freq: Two times a day (BID) | CUTANEOUS | Status: DC

## 2015-05-21 NOTE — Progress Notes (Signed)
Subjective:  [name] was seen and examined this morning.  Sleeping but easily arousable.  Appetite good.  Still with chronic low back pain but was able to sit through HD yesterday.  Objective: Vital signs in last 24 hours: Filed Vitals:   05/20/15 1500 05/20/15 1542 05/20/15 2149 05/21/15 0548  BP: 136/90 131/90 111/63 143/80  Pulse: 70 76 83 73  Temp:  97.5 F (36.4 C) 98.1 F (36.7 C) 98 F (36.7 C)  TempSrc:  Oral Oral   Resp:   18 18  Height:      Weight:  171 lb 4.8 oz (77.7 kg)  173 lb 1 oz (78.5 kg)  SpO2:  91% 97% 97%   Weight change: 10.6 oz (0.3 kg)  Intake/Output Summary (Last 24 hours) at 05/21/15 1122 Last data filed at 05/20/15 1810  Gross per 24 hour  Intake    240 ml  Output   1676 ml  Net  -1436 ml   Physical Exam:  General:  In bed in NAD HEENT:  Pingueculae in both sclera. Facial and upper extremity swelling improved  Cardiac:  Irregular, no M/R/G Pulmonary:  CTA B/L, no W/R/R Abdominal:  +BS. Soft, nontender, nondistended MSK: lumbar paraspinal muscle TTP Neuro:  Awake and alert, responding appropriately to questions. Hard of hearing.  Lab Results: Basic Metabolic Panel:  Recent Labs Lab 05/19/15 0559 05/20/15 0517  NA 134* 135  K 4.8 5.1  CL 97* 98*  CO2 24 27  GLUCOSE 89 90  BUN 40* 54*  CREATININE 7.93* 9.24*  CALCIUM 8.4* 8.7*  PHOS 4.6 5.1*   CBG:  Recent Labs Lab 05/18/15 2121 05/19/15 0743 05/19/15 2302 05/20/15 0750 05/20/15 2157 05/21/15 0748  GLUCAP 98 85 97 83 102* 97   Coagulation:  Recent Labs Lab 05/18/15 0705 05/19/15 0559 05/20/15 0517 05/21/15 0540  LABPROT 22.6* 26.4* 28.8* 27.7*  INR 2.01* 2.46* 2.77* 2.63*   \Medications: I have reviewed the patient's current medications. Scheduled Meds: . aspirin  81 mg Oral Daily  . calcium acetate  1,334 mg Oral TID WC  . Chlorhexidine Gluconate Cloth  6 each Topical Q0600  . diclofenac sodium  2 g Topical QID  . doxercalciferol  2 mcg Intravenous Q  M,W,F-HD  . labetalol  200 mg Oral 3 times per day  . mupirocin ointment   Nasal BID  . sodium chloride  3 mL Intravenous Q12H  . warfarin  5 mg Oral ONCE-1800  . warfarin   Does not apply Once  . Warfarin - Pharmacist Dosing Inpatient   Does not apply q1800   Continuous Infusions: none PRN Meds:.heparin, heparin, ipratropium-albuterol, lidocaine (PF), lidocaine-prilocaine, pentafluoroprop-tetrafluoroeth, polyethylene glycol   Assessment/Plan:  Volume Overload 2/2 ESRD and Hemodialysis Non-Adherence: plan for TTHS schedule. Appreciate nephro assistance and patience.   - A plan is in progress to meet his HD needs upon discharge.  As of now, he has a SNF bed at [facility].  Chronic low back pain:  I am not sure if he is using the heat packs/ice packs that are ordered prn.   It looks like he is using the Voltaren gel.  He is on capsaicin cream as outpatient for this back pain. 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 Was advised by Audiology to contact [facility], Nose, and Throat. Should hear from Tonia Ghent, Au.D. regarding donated  hearing aids, however have not heard back. This may be an issue to be addressed at his SNF if we are unable to obtain them by discharge.  New Onset Atrial Fibrillation: INR therapeutic on coumadin.  Rate controlled on BB. - Warfarin per pharmacy recommendations - continue Labetolol to 200 mg TID  Possible Obstructive Sleep Apnea:  Will need sleep study as an outpatient in order to obtain CPAP. - intermittently using CPAP  ?Bipolar Disorder: No signs/symptoms of Bipolar.  Appreciate psych input.  He does not appear to require any antidepressants or mood stabilizers this time.  Psych social work will follow for med management.  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  Type 2 Diabetes: No  medications. Recent A1c of 5.7. CBGs have been stable, although has been refusing some CBGs. - CBGs BID only with SSI-S  HTN: stable.  - continue Labetolol 200 mg TID  Homelessness:  Significant barrier to care. Plan for now is to be discharged to SNF. - CSW following  Dispo: Disposition is deferred at this time, awaiting HD/CLIP process.  Anticipated discharge in approximately 1-2 days.  The patient does not have a current PCP (Provider Not In System) and does not need an [facility] hospital follow-up appointment after discharge. He needs to establish care at [facility]  The patient does have transportation limitations that hinder transportation to clinic appointments. He will need access to a HD center on discharge.  .Services Needed at time of discharge: Y = Yes, Blank = No PT: Yes  OT:   RN:   Equipment:   Other: Skilled Nursing    LOS: 8 days   Noah Oman, DO 05/21/2015, 11:22 AM

## 2015-05-21 NOTE — Progress Notes (Signed)
Central Telemetry alerted the RN of patient having Multifocal PVC's. Vitals signs stable, patient denies any complaints of pain or discomfort. MD made aware at 1439.

## 2015-05-21 NOTE — Progress Notes (Signed)
Patient ID: Noah Lewis, male   DOB: [birth year], 64 y.o.   MRN: [id number]  [facility] Progress Note    Subjective:   Sleeping comfortably   Objective:   BP 143/80 mmHg  Pulse 73  Temp(Src) 98 F (36.7 C) (Oral)  Resp 18  Ht 5\' 7"  (1.702 m)  Wt 78.5 kg (173 lb 1 oz)  BMI 27.10 kg/m2  SpO2 97%  Intake/Output: I/O last 3 completed shifts: In: 480 [P.O.:480] Out: 1726 [Urine:50; Other:1676]   Intake/Output this shift:    Weight change: 0.3 kg (10.6 oz)  Physical Exam: Gen:WD WN AAM in NAD CVS:no rub Resp:scattered rhonchi OHY:WVPXTG Ext:no edema, LUE AVF +T/B  Labs: BMET  Recent Labs Lab 05/14/15 1032 05/15/15 0900 05/17/15 0828 05/18/15 0705 05/19/15 0559 05/20/15 0517  NA 141 143 136 138 134* 135  K 3.7 3.6 3.8 4.4 4.8 5.1  CL 103 105 98* 99* 97* 98*  CO2 23 23 25 28 24 27   GLUCOSE 152* 118* 148* 94 89 90  BUN 57* 68* 40* 25* 40* 54*  CREATININE 10.56* 11.91* 9.14* 6.25* 7.93* 9.24*  ALBUMIN 3.1* 3.2*  --  3.3* 3.3* 3.2*  CALCIUM 8.2* 8.0* 8.1* 8.3* 8.4* 8.7*  PHOS 7.2* 7.6*  --  3.6 4.6 5.1*   CBC  Recent Labs Lab 05/14/15 1629 05/20/15 1509 05/21/15 0540  WBC 5.2 5.2 5.2  HGB 10.3* 10.4* 11.0*  HCT 32.1* 31.9* 34.8*  MCV 84.3 84.2 86.4  PLT 195 178 149*    @IMGRELPRIORS @ Medications:    . aspirin  81 mg Oral Daily  . calcium acetate  1,334 mg Oral TID WC  . Chlorhexidine Gluconate Cloth  6 each Topical Q0600  . diclofenac sodium  2 g Topical QID  . doxercalciferol  2 mcg Intravenous Q M,W,F-HD  . labetalol  200 mg Oral 3 times per day  . mupirocin ointment   Nasal BID  . sodium chloride  3 mL Intravenous Q12H  . warfarin   Does not apply Once  . Warfarin - Pharmacist Dosing Inpatient   Does not apply q1800   Dialyzes at [place] 80 kg. HD Bath 3K, 3calc, Dialyzer 200, Heparin none. Access LAVF (HD cath removed this hospitalization and using AVF without issue). hectorol 2 mcg per tx- does  not look like he is on ESA  Assessment/ Plan:   1. SOB- due to noncompliance with hemodialysis and volume overload. Improved with HD  2. ESRD: nonadherent with HD at his home unit in [facility] and is only getting sporadic treatments. Several barriers include bipolar disorder and hearing loss. Appreciate SW/CM assistance in finding SNF placement to help with outpt HD compliance as well as psychiatric issues 1. Doing better with HD and mood and now will be going to [facility] so transportation will not be an issue moving forward 2. Await outpt HD schedule and location 3. 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 calcium acetate and follow now at goal 5. Nutrition:per primary 6. Hypertension:stable 7. Vascular access- had TDC removed and using L AVF 8. Disposition- appreciate SW/CM assistance and now has room at [facility]. Awaiting outpt HD spot.  9. Bipolar disorder- stable but needs to follow up with Psych. He may also benefit from medications which were supposed to have been followed up by Psychiatry on 05/02/15 and still no mention or prescription for medications related to his Bipolar Disorder. This needs to be addressed before discharge to  help prevent readmission. 10. Hearing loss- would greatly benefit from audiology eval and possible hearing aids. Etiology of hearing loss unknown.  Noah Lewis A 05/21/2015, 8:56 AM

## 2015-05-21 NOTE — Progress Notes (Signed)
ANTICOAGULATION CONSULT NOTE - Follow Up Consult  Pharmacy Consult for Warfarin Indication: atrial fibrillation  Allergies  Allergen Reactions  . Penicillins Other (See Comments)    unsure    Patient Measurements: Height: 5\' 7"  (170.2 cm) Weight: 173 lb 1 oz (78.5 kg) IBW/kg (Calculated) : 66.1  Vital Signs: Temp: 98 F (36.7 C) (08/28 0548) Temp Source: Oral (08/27 2149) BP: 143/80 mmHg (08/28 0548) Pulse Rate: 73 (08/28 0548)  Labs:  Recent Labs  05/19/15 0559 05/20/15 0517 05/20/15 1509 05/21/15 0540  HGB  --   --  10.4* 11.0*  HCT  --   --  31.9* 34.8*  PLT  --   --  178 149*  LABPROT 26.4* 28.8*  --  27.7*  INR 2.46* 2.77*  --  2.63*  CREATININE 7.93* 9.24*  --   --     Estimated Creatinine Clearance: 7.7 mL/min (by C-G formula based on Cr of 9.24).   Medications:  Scheduled:  . aspirin  81 mg Oral Daily  . calcium acetate  1,334 mg Oral TID WC  . Chlorhexidine Gluconate Cloth  6 each Topical Q0600  . diclofenac sodium  2 g Topical QID  . doxercalciferol  2 mcg Intravenous Q M,W,F-HD  . labetalol  200 mg Oral 3 times per day  . mupirocin ointment   Nasal BID  . sodium chloride  3 mL Intravenous Q12H  . warfarin   Does not apply Once  . Warfarin - Pharmacist Dosing Inpatient   Does not apply q1800   Infusions:   PRN: heparin, heparin, ipratropium-albuterol, lidocaine (PF), lidocaine-prilocaine, pentafluoroprop-tetrafluoroeth, polyethylene glycol  Assessment: 64 yo M admitted 05/12/2015 presents with c/o worsening SOB. Pt had not been to HD since last admission on 05/03/15. He also c/o weakness, fatigue, and worsening back pain. Went for emergent HD d/t significant volume overload.  PMH: ESRD, SVC syndrome, DM2, HTN, COPD  Mr. Dilone was found to be in Afib and pharmacy is consulted to dose warfarin. Baseline INR = 1.36, H/H stable, Plts stable, no s/sx of bleeding noted. Warfarin predictor points = 7  INR today therapeutic up to 2>>2.46>2.77>2.63.   INR therapeutic x 4 days.  Hgb 11, Plts dec to 149. No s/sx of bleeding noted.  Goal of Therapy:  INR 2-3 Monitor platelets by anticoagulation protocol: Yes   Plan:  - Warfarin 5 mg x1 tonight - Monitor daily INR, CBC q3days, s/sx of bleeding  Bennye Alm, PharmD Pharmacy Resident [phone]

## 2015-05-21 NOTE — Progress Notes (Signed)
Pt. Refused cpap. 

## 2015-05-22 LAB — GLUCOSE, CAPILLARY
GLUCOSE-CAPILLARY: 84 mg/dL (ref 65–99)
Glucose-Capillary: 122 mg/dL — ABNORMAL HIGH (ref 65–99)

## 2015-05-22 LAB — PROTIME-INR
INR: 2.63 — AB (ref 0.00–1.49)
PROTHROMBIN TIME: 27.7 s — AB (ref 11.6–15.2)

## 2015-05-22 MED ORDER — WARFARIN SODIUM 5 MG PO TABS
5.0000 mg | ORAL_TABLET | Freq: Every day | ORAL | Status: DC
  Administered 2015-05-22: 5 mg via ORAL
  Filled 2015-05-22: qty 1

## 2015-05-22 MED ORDER — POLYETHYLENE GLYCOL 3350 17 G PO PACK: 17.0000 g | PACK | Freq: Every day | ORAL | Status: DC | PRN

## 2015-05-22 MED ORDER — WARFARIN SODIUM 5 MG PO TABS: 5.0000 mg | ORAL_TABLET | Freq: Every day | ORAL | Status: DC

## 2015-05-22 NOTE — Clinical Social Work Note (Signed)
CSW talked with Gayla Medicus, nurse liasion with [facility] regarding patient discharging to [facility] on Tuesday, 8/30.  She asked about patient's PASRR number as he has bi-polar disorder. Patient has a 2012 PASRR, however Tammy asked if patient's biploar was included in his diagnosis at that time.  CSW will f/u regarding patient's PASRR and get back with Tammy on 05/23/15.   Phill Steck Givens, MSW, LCSW Licensed Clinical Social Worker [place] [phone]

## 2015-05-22 NOTE — Progress Notes (Signed)
05/22/2015 3:16 PM Hemodialysis Outpatient note; this patient has been accepted at the [facility] on a Tuesday, Thursday and Saturday 2nd shift schedule.The center can begin treatment on Tuesday August 30 at 11:15 AM. Thank you. Gordy Savers

## 2015-05-22 NOTE — Progress Notes (Signed)
ANTICOAGULATION CONSULT NOTE - Follow Up Consult  Pharmacy Consult for Warfarin Indication: atrial fibrillation  Allergies  Allergen Reactions  . Penicillins Other (See Comments)    unsure    Patient Measurements: Height: 5\' 7"  (170.2 cm) Weight: 169 lb 15.6 oz (77.1 kg) IBW/kg (Calculated) : 66.1  Vital Signs: Temp: 98.5 F (36.9 C) (08/29 0507) Temp Source: Oral (08/29 0507) BP: 132/67 mmHg (08/29 0507) Pulse Rate: 67 (08/29 0507)  Labs:  Recent Labs  05/20/15 0517 05/20/15 1509 05/21/15 0540 05/22/15 0337  HGB  --  10.4* 11.0*  --   HCT  --  31.9* 34.8*  --   PLT  --  178 149*  --   LABPROT 28.8*  --  27.7* 27.7*  INR 2.77*  --  2.63* 2.63*  CREATININE 9.24*  --   --   --     Estimated Creatinine Clearance: 7.7 mL/min (by C-G formula based on Cr of 9.24).   Medications:  Scheduled:  . aspirin  81 mg Oral Daily  . calcium acetate  1,334 mg Oral TID WC  . capsaicin   Topical BID  . Chlorhexidine Gluconate Cloth  6 each Topical Q0600  . doxercalciferol  2 mcg Intravenous Q M,W,F-HD  . labetalol  200 mg Oral 3 times per day  . mupirocin ointment   Nasal BID  . sodium chloride  3 mL Intravenous Q12H  . warfarin   Does not apply Once  . Warfarin - Pharmacist Dosing Inpatient   Does not apply q1800   Infusions:   PRN: heparin, heparin, ipratropium-albuterol, lidocaine (PF), lidocaine-prilocaine, pentafluoroprop-tetrafluoroeth, polyethylene glycol  Assessment: 64 yo M admitted 05/12/2015 presents with c/o worsening SOB. Pt had not been to HD since last admission on 05/03/15. He also c/o weakness, fatigue, and worsening back pain. Went for emergent HD d/t significant volume overload.  PMH: ESRD, SVC syndrome, DM2, HTN, COPD  Mr. Koerber was found to be in Afib and pharmacy is consulted to dose warfarin. Baseline INR = 1.36, H/H stable, Plts stable, no s/sx of bleeding noted. Warfarin predictor points = 7  INR today therapeutic   Goal of Therapy:  INR  2-3 Monitor platelets by anticoagulation protocol: Yes   Plan:  - Warfarin 5 mg po daily at 1800 pm - Monitor daily INR, CBC q3days, s/sx of bleeding  Thank you Anette Guarneri, PharmD [phone]

## 2015-05-22 NOTE — Progress Notes (Signed)
Subjective:  [name] was seen and examined this morning. He appeared in good spirits as he was eating his normal breakfast. He said that he was able to sit through an entire dialysis session on Saturday with his  heating packs. He was wondering when he would leave the hospital. Had some multifocal PVCs yesterday afternoon, but patient's vitals were stable and he was asymptomatic. Patient has now been consistently refusing his CPAP.   Objective: Vital signs in last 24 hours: Filed Vitals:   05/21/15 1308 05/21/15 1432 05/21/15 2115 05/22/15 0507  BP: 93/56 123/85 137/92 132/67  Pulse: 78 84 79 67  Temp: 97.7 F (36.5 C)   98.5 F (36.9 C)  TempSrc: Oral   Oral  Resp: 18  16 18   Height:      Weight:    169 lb 15.6 oz (77.1 kg)  SpO2: 95% 98% 97% 100%   Weight change: -4 lb 10.1 oz (-2.1 kg)  Intake/Output Summary (Last 24 hours) at 05/22/15 0834 Last data filed at 05/21/15 2000  Gross per 24 hour  Intake    582 ml  Output      0 ml  Net    582 ml   Physical Exam:  General:  Sitting up on edge of bed, eating breakfast, in NAD HEENT:  Pingueculae in both sclera. Facial and upper extremity swelling improved  Cardiac:  Irregularly irregular with normal rate. No murmurs rubs or gallops Pulmonary:  Clear to auscultation bilaterally without wheezes or crackles. Abdominal:  +BS. Soft, nontender, nondistended Neuro:  Awake and alert, responding appropriately to questions. Hard of hearing.  Lab Results: Basic Metabolic Panel:  Recent Labs Lab 05/19/15 0559 05/20/15 0517  NA 134* 135  K 4.8 5.1  CL 97* 98*  CO2 24 27  GLUCOSE 89 90  BUN 40* 54*  CREATININE 7.93* 9.24*  CALCIUM 8.4* 8.7*  PHOS 4.6 5.1*   CBG:  Recent Labs Lab 05/19/15 2302 05/20/15 0750 05/20/15 2157 05/21/15 0748 05/21/15 2050 05/22/15 0826  GLUCAP 97 83 102* 97 115* 84   Coagulation:  Recent Labs Lab 05/19/15 0559 05/20/15 0517 05/21/15 0540 05/22/15 0337  LABPROT 26.4* 28.8*  27.7* 27.7*  INR 2.46* 2.77* 2.63* 2.63*   \Medications: I have reviewed the patient's current medications. Scheduled Meds: . aspirin  81 mg Oral Daily  . calcium acetate  1,334 mg Oral TID WC  . capsaicin   Topical BID  . Chlorhexidine Gluconate Cloth  6 each Topical Q0600  . doxercalciferol  2 mcg Intravenous Q M,W,F-HD  . labetalol  200 mg Oral 3 times per day  . mupirocin ointment   Nasal BID  . sodium chloride  3 mL Intravenous Q12H  . warfarin   Does not apply Once  . Warfarin - Pharmacist Dosing Inpatient   Does not apply q1800   Continuous Infusions: none PRN Meds:.heparin, heparin, ipratropium-albuterol, lidocaine (PF), lidocaine-prilocaine, pentafluoroprop-tetrafluoroeth, polyethylene glycol   Assessment/Plan:  Volume Overload 2/2 ESRD and Hemodialysis Non-Adherence: plan for TTHS schedule. Appreciate nephro assistance and patience.   - A plan is in progress to meet his HD needs upon discharge.  As of now, he has a SN[facility] bed at [facility].  Chronic low back pain:  I am not sure if he is using the heat packs/ice packs that are ordered prn.   It looks like he is using the Voltaren gel.  He is on capsaicin cream as outpatient for this back pain. 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 Was advised by Audiology to contact [facility], and Throat. Should hear from [facility], [facility] regarding donated hearing aids, however have not heard back. This may be an issue to be addressed at his SNF if we are unable to obtain them by discharge.  New Onset Atrial Fibrillation: INR therapeutic on coumadin.  Rate controlled on BB. - Warfarin per pharmacy recommendations - continue Labetolol to 200 mg TID  Possible Obstructive Sleep Apnea:  Will need sleep study as an outpatient in order to obtain CPAP. - intermittently using CPAP - Discussion today on the  importance of CPAP  ?Bipolar Disorder: No signs/symptoms of Bipolar.  Appreciate psych input.  He does not appear to require any antidepressants or mood stabilizers this time.  Psych social work will follow for med management.  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  Type 2 Diabetes: No medications. Recent A1c of 5.7. CBGs have been stable, although has been refusing some CBGs. - CBGs BID only with SSI-S  HTN: stable.  - continue Labetolol 200 mg TID  Homelessness:  Significant barrier to care. Plan for now is to be discharged to SNF. - CSW following  Dispo: Disposition is deferred at this time, awaiting HD/CLIP process.  Anticipated discharge in approximately 1-2 days.  The patient does not have a current PCP (Provider Not In System) and does not need an [facility] hospital follow-up appointment after discharge. He needs to establish care at [facility]  The patient does have transportation limitations that hinder transportation to clinic appointments. He will need access to a HD center on discharge.  .Services Needed at time of discharge: Y = Yes, Blank = No PT: Yes  OT:   RN:   Equipment:   Other: Skilled Nursing    LOS: 9 days   Liberty Handy, MD 05/22/2015, 8:34 AM

## 2015-05-22 NOTE — Progress Notes (Signed)
Patient is refusing CPAP for tonight. 

## 2015-05-22 NOTE — Progress Notes (Signed)
Patient ID: Noah Lewis, male   DOB: [birth year], 64 y.o.   MRN: [id number]  [facility] Progress Note    Subjective:   No new events We discussed recent issues running dialysis --My understanding is that he cannot return to [facility] based on SNF availability  --He verbally commits to the attending every dialysis treatment, stating for the full-time, and for any challenges or difficulties that developed discussing directly with me    Objective:   BP 132/67 mmHg  Pulse 67  Temp(Src) 98.5 F (36.9 C) (Oral)  Resp 18  Ht 5\' 7"  (1.702 m)  Wt 77.1 kg (169 lb 15.6 oz)  BMI 26.62 kg/m2  SpO2 100%  Intake/Output: I/O last 3 completed shifts: In: 582 [P.O.:582] Out: -    Intake/Output this shift:  Total I/O In: 120 [P.O.:120] Out: -  Weight change: -2.1 kg (-4 lb 10.1 oz)  Physical Exam: Gen:WD WN AAM in NAD CVS:no rub Resp:scattered rhonchi DXA:JOINOM Ext:no edema, LUE AVF +T/B  Labs: BMET  Recent Labs Lab 05/17/15 0828 05/18/15 0705 05/19/15 0559 05/20/15 0517  NA 136 138 134* 135  K 3.8 4.4 4.8 5.1  CL 98* 99* 97* 98*  CO2 25 28 24 27   GLUCOSE 148* 94 89 90  BUN 40* 25* 40* 54*  CREATININE 9.14* 6.25* 7.93* 9.24*  ALBUMIN  --  3.3* 3.3* 3.2*  CALCIUM 8.1* 8.3* 8.4* 8.7*  PHOS  --  3.6 4.6 5.1*   CBC  Recent Labs Lab 05/20/15 1509 05/21/15 0540  WBC 5.2 5.2  HGB 10.4* 11.0*  HCT 31.9* 34.8*  MCV 84.2 86.4  PLT 178 149*    @IMGRELPRIORS @ Medications:    . aspirin  81 mg Oral Daily  . calcium acetate  1,334 mg Oral TID WC  . capsaicin   Topical BID  . Chlorhexidine Gluconate Cloth  6 each Topical Q0600  . doxercalciferol  2 mcg Intravenous Q M,W,F-HD  . labetalol  200 mg Oral 3 times per day  . mupirocin ointment   Nasal BID  . sodium chloride  3 mL Intravenous Q12H  . warfarin  5 mg Oral q1800  . warfarin   Does not apply Once  . Warfarin - Pharmacist Dosing Inpatient   Does not apply q1800   Dialyzes at [place] 80 kg. HD Bath 3K, 3calc, Dialyzer 200, Heparin none. Access LAVF (HD cath removed this hospitalization and using AVF without issue). hectorol 2 mcg per tx- does not look like he is on ESA  Assessment/ Plan:   1. SOB- due to noncompliance with hemodialysis and volume overload. Improved with HD  2. ESRD THS LUE AVF:  1. Will be resident at local SNF 2. Has hx/o noncompliance 3. Discussed 8/29 w/ pt; he verbally commits to adherence 4. CLIP Pt, if bed at [facility] will accept there 5. HD tomorrow,  3. 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 calcium acetate and follow now at goal 5. Nutrition:per primary 6. Hypertension:stable 7. Vascular access- had TDC removed and using L AVF 8. Disposition- appreciate SW/CM assistance and now has room at [facility]. Awaiting outpt HD spot.  9. Bipolar disorder- stable but needs to follow up with Psych. He may also benefit from medications which were supposed to have been followed up by Psychiatry on 05/02/15 and still no mention or prescription for medications related to his Bipolar Disorder. This needs to be addressed before discharge to help prevent readmission.  10. Hearing loss- would greatly benefit from audiology eval and possible hearing aids. Etiology of hearing loss unknown.  Pearson Grippe B 05/22/2015, 11:47 AM

## 2015-05-22 NOTE — Discharge Instructions (Addendum)
Noah Lewis, it was a pleasure to meet you. You were seen in the hospital because you urgently needed dialysis. I cannot emphasize enough the importance of dialysis. You need it to stay alive. Your priority is to go to your dialysis sessions. If your back starts to hurt during dialysis, you have the right to request medications or heating pads to ease the back pain so that you may finish your dialysis.  Because of an irregular heartbeat, we started a new medication, Coumadin. Your irregular heartbeat, called atrial fibrillation (A Fib), puts you at risk for strokes, which are when blood clots go to your brain. The new medication, Coumadin (also called Warfarin), will lower your chance of having a stroke. It is an effective drug, but it can also be dangerous and may increase your chance of bleeding if it isn't watched closely. If you take this drug, you have to get regular blood checks, called INRs, to make sure the Coumadin is not dangerous. If, for any reason, you are unable to get these INR blood checks, you should STOP taking the Coumadin, because it can become dangerous if you aren't getting INR blood checks. You can get these blood checks at your dialysis center. So, if you stopped going to dialysis, you should STOP taking the Coumadin. If you cannot take Coumadin, you can take a baby Aspirin daily (81 mg) as another option  Your hearing was assessed in the hospital, too. The ear doctor thinks your hearing will probably get better with hearing aids. We tried to get you some free hearing aids in the hospital, but they could not see you in time. You should get hearing aids. There are programs to get free or low cost hearing aids. For more information call Tonia G[place] at [place], and Throat at [phone].  We tried to use a CPAP in the hospital because you may have a condition called Obstructive Sleep Apnea. The CPAP was the mask we tried to use at night. Sleep Apnea is when you temporarily  stop breathing at night, which can be dangerous to your body if left untreated. To get a CPAP, you should get a sleep study. We have scheduled a sleep study evaluation at   We have scheduled you an appointment at [facility] at  on . They will follow-up on your irregular heartbeat, coumadin, and see how dialysis is going.  If you start to become short of breath, get confused, feel like you're fainting, have new weakness or tingling, certain limbs become weak, or you start to slur your speech, you should seek medical attention immediately.

## 2015-05-23 LAB — RENAL FUNCTION PANEL
Albumin: 3.1 g/dL — ABNORMAL LOW (ref 3.5–5.0)
Anion gap: 13 (ref 5–15)
BUN: 73 mg/dL — AB (ref 6–20)
CHLORIDE: 96 mmol/L — AB (ref 101–111)
CO2: 25 mmol/L (ref 22–32)
CREATININE: 9.62 mg/dL — AB (ref 0.61–1.24)
Calcium: 8.5 mg/dL — ABNORMAL LOW (ref 8.9–10.3)
GFR calc Af Amer: 6 mL/min — ABNORMAL LOW (ref >60)
GFR calc non Af Amer: 5 mL/min — ABNORMAL LOW (ref >60)
GLUCOSE: 159 mg/dL — AB (ref 65–99)
Phosphorus: 6 mg/dL — ABNORMAL HIGH (ref 2.5–4.6)
Potassium: 4.6 mmol/L (ref 3.5–5.1)
SODIUM: 134 mmol/L — AB (ref 135–145)

## 2015-05-23 LAB — CBC WITH DIFFERENTIAL/PLATELET
Basophils Absolute: 0 10*3/uL (ref 0.0–0.1)
Basophils Relative: 0 % (ref 0–1)
EOS ABS: 0.4 10*3/uL (ref 0.0–0.7)
EOS PCT: 9 % — AB (ref 0–5)
HCT: 32.6 % — ABNORMAL LOW (ref 39.0–52.0)
HEMOGLOBIN: 10.7 g/dL — AB (ref 13.0–17.0)
LYMPHS ABS: 0.8 10*3/uL (ref 0.7–4.0)
Lymphocytes Relative: 18 % (ref 12–46)
MCH: 27.7 pg (ref 26.0–34.0)
MCHC: 32.8 g/dL (ref 30.0–36.0)
MCV: 84.5 fL (ref 78.0–100.0)
MONO ABS: 0.5 10*3/uL (ref 0.1–1.0)
MONOS PCT: 12 % (ref 3–12)
NEUTROS PCT: 61 % (ref 43–77)
Neutro Abs: 2.8 10*3/uL (ref 1.7–7.7)
Platelets: 146 10*3/uL — ABNORMAL LOW (ref 150–400)
RBC: 3.86 MIL/uL — ABNORMAL LOW (ref 4.22–5.81)
RDW: 16.8 % — AB (ref 11.5–15.5)
WBC: 4.5 10*3/uL (ref 4.0–10.5)

## 2015-05-23 LAB — GLUCOSE, CAPILLARY
GLUCOSE-CAPILLARY: 68 mg/dL (ref 65–99)
Glucose-Capillary: 109 mg/dL — ABNORMAL HIGH (ref 65–99)

## 2015-05-23 MED ORDER — CALCIUM ACETATE (PHOS BINDER) 667 MG PO CAPS: 2001.0000 mg | ORAL_CAPSULE | Freq: Three times a day (TID) | ORAL | Status: DC

## 2015-05-23 MED ORDER — POLYETHYLENE GLYCOL 3350 17 G PO PACK: 17.0000 g | PACK | Freq: Every day | ORAL | Status: DC | PRN

## 2015-05-23 MED ORDER — ACETAMINOPHEN 325 MG PO TABS: 325.0000 mg | ORAL_TABLET | Freq: Four times a day (QID) | ORAL | Status: DC | PRN

## 2015-05-23 MED ORDER — MUPIROCIN 2 % EX OINT: 1.0000 "application " | TOPICAL_OINTMENT | Freq: Two times a day (BID) | CUTANEOUS | Status: DC

## 2015-05-23 MED ORDER — ASPIRIN 81 MG PO CHEW: 81.0000 mg | CHEWABLE_TABLET | Freq: Every day | ORAL | Status: DC

## 2015-05-23 MED ORDER — IPRATROPIUM-ALBUTEROL 0.5-2.5 (3) MG/3ML IN SOLN: 3.0000 mL | Freq: Four times a day (QID) | RESPIRATORY_TRACT | Status: DC | PRN

## 2015-05-23 MED ORDER — CAPSAICIN 0.025 % EX CREA: TOPICAL_CREAM | Freq: Two times a day (BID) | CUTANEOUS | Status: DC | PRN

## 2015-05-23 MED ORDER — LABETALOL HCL 200 MG PO TABS: 400.0000 mg | ORAL_TABLET | Freq: Three times a day (TID) | ORAL | Status: DC

## 2015-05-23 MED ORDER — WARFARIN SODIUM 5 MG PO TABS: 5.0000 mg | ORAL_TABLET | Freq: Every day | ORAL | Status: DC

## 2015-05-23 NOTE — Procedures (Signed)
I was present at this dialysis session. I have reviewed the session itself and made appropriate changes.   Doing well. UF goal 3L.  AVF.  No c/o from Pt. HB>10.  K ok.  Inc Phos BInders pre DC.   Pearson Grippe  MD 05/23/2015, 10:38 AM

## 2015-05-23 NOTE — Clinical Social Work Placement (Signed)
   CLINICAL SOCIAL WORK PLACEMENT  NOTE  Date:  05/23/2015  Patient Details  Name: Aris Even MRN: [id number] Date of Birth: 08-25-1951  Clinical Social Work is seeking post-discharge placement for this patient at the [place] level of care (*CSW will initial, date and re-position this form in  chart as items are completed):  Yes   Patient/family provided with [facility] Work Department's list of facilities offering this level of care within the geographic area requested by the patient (or if unable, by the patient's family).  Yes   Patient/family informed of their freedom to choose among providers that offer the needed level of care, that participate in Medicare, Medicaid or managed care program needed by the patient, have an available bed and are willing to accept the patient.  Yes   Patient/family informed of 's ownership interest in [facility] and [facility], as well as of the fact that they are under no obligation to receive care at these facilities.  PASRR submitted to EDS on       PASRR number received on       Existing PASRR number confirmed on 05/15/15     FL2 transmitted to all facilities in geographic area requested by pt/family on 05/15/15     FL2 transmitted to all facilities within larger geographic area on       Patient informed that his/her managed care company has contracts with or will negotiate with certain facilities, including the following:        Yes   Patient/family informed of bed offers received.  Patient chooses bed at [place]     Physician recommends and patient chooses bed at      Patient to be transferred to [facility] on 05/23/15.  Patient to be transferred to facility by Ambulance     Patient family notified on 05/23/15 of transfer.  Name of family member notified:  Patient to update family     PHYSICIAN Please sign FL2     Additional  Comment:    Barbette Or, [place]

## 2015-05-23 NOTE — Progress Notes (Signed)
PT Cancellation Note  Patient Details Name: Noah Lewis MRN: [id number] DOB: 1951-05-02   Cancelled Treatment:     Pt is unavailable for treatment at this time due to currently in HD. Will attempt PT tomorrow.   Lelon Mast 05/23/2015, 11:37 AM

## 2015-05-23 NOTE — Progress Notes (Signed)
Subjective:  [name] was seen and examined this morning in the HD center. He said he was looking forward to leaving the hospital. He had no new complaints. I discussed the importance of using the CPAP in the HD unit.   Objective: Vital signs in last 24 hours: Filed Vitals:   05/08/15 1730 05/08/15 1745 05/08/15 1800 05/08/15 1843  BP: 162/123 165/120 171/111 169/103  Pulse: 103 102 103 98  Temp:      TempSrc:      Resp:    16  SpO2: 98% 97% 94% 97%   Weight change:  No intake or output data in the 24 hours ending 05/23/15 1733 Physical Exam:  General:  Lying in HD bed, NAD HEENT:  Pingueculae in both sclera. Facial and upper extremity swelling improved from previous Cardiac:  Irregularly irregular with normal rate. No murmurs rubs or gallops Pulmonary:  Moving good volumes of air Abdominal:  +BS. Soft, nontender, nondistended Neuro:  Awake and alert, responding appropriately to questions. Hard of hearing.  Lab Results: Basic Metabolic Panel:  Recent Labs Lab 05/20/15 0517 05/23/15 0920  NA 135 134*  K 5.1 4.6  CL 98* 96*  CO2 27 25  GLUCOSE 90 159*  BUN 54* 73*  CREATININE 9.24* 9.62*  CALCIUM 8.7* 8.5*  PHOS 5.1* 6.0*   CBG:  Recent Labs Lab 05/20/15 2157 05/21/15 0748 05/21/15 2050 05/22/15 0826 05/22/15 2017 05/23/15 0729  GLUCAP 102* 97 115* 84 122* 68   Coagulation:  Recent Labs Lab 05/19/15 0559 05/20/15 0517 05/21/15 0540 05/22/15 0337  LABPROT 26.4* 28.8* 27.7* 27.7*  INR 2.46* 2.77* 2.63* 2.63*   \Medications: I have reviewed the patient's current medications. Scheduled Meds:  Continuous Infusions:none PRN Meds:.   Assessment/Plan:  Volume Overload 2/2 ESRD and Hemodialysis Non-Adherence: Continue TTHS schedule.  - SNF bed and HD bed in place. May be discharged today.  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 Was advised by Audiology to contact [facility], Nose, and Throat. [place], [employer] regarding donated hearing aids, but will not be able to see him before he leaves. This will be an issue to be addressed at his SNF.  New Onset Atrial Fibrillation: INR therapeutic on coumadin.  Rate controlled on BB. - Warfarin per pharmacy recommendations - continue Labetolol to 200 mg TID  Possible Obstructive Sleep Apnea:  Encouraged to obtain sleep study as outpatient for CPAP - intermittently using CPAP - Discussed the importance of CPAP  ?Bipolar Disorder: No signs/symptoms of Bipolar.  Appreciate psych input.  He does not appear to require any antidepressants or mood stabilizers this time.  Psych social work will follow for med management.  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  Type 2 Diabetes: No medications. Recent A1c of 5.7. CBGs have been stable, although has been refusing some CBGs. - CBGs BID only with SSI-S  HTN: stable.  - continue Labetolol 200 mg TID  Homelessness:  Significant barrier to care. Plan for now is to be discharged to SNF. - CSW following  Dispo: Disposition is deferred at this time, awaiting HD/CLIP process.  Anticipated discharge in approximately 1-2 days.  The patient does not have a current PCP (Provider Not In System) and does not need an [facility] hospital follow-up appointment after discharge. He needs to establish care at [facility]  The patient does have transportation limitations that hinder transportation to clinic appointments. He will need access to a HD center on discharge.  .Services Needed at time of discharge: Y = Yes, Blank = No PT: Yes  OT:   RN:   Equipment:   Other: Skilled Nursing      Liberty Handy, MD 05/23/2015, 5:33 PM

## 2015-05-23 NOTE — Progress Notes (Signed)
Report called to Guadelupe Sabin LPN at [facility].

## 2015-05-23 NOTE — Clinical Social Work Note (Signed)
Clinical Social Worker facilitated patient discharge including contacting patient and facility to confirm patient discharge plans.  Clinical information faxed to facility and family agreeable with plan.  CSW arranged ambulance transport via [facility] to [facility].  RN to call report prior to discharge.  Clinical Social Worker will sign off for now as social work intervention is no longer needed. Please consult [place] again if new need arises.  Barbette Or, [place]

## 2015-05-23 NOTE — Progress Notes (Signed)
NURSING PROGRESS NOTE  Noah Lewis [id number] Discharge Data: 05/23/2015 4:02 PM Attending Provider: Bartholomew Crews, MD HTM:[id number] NOT IN [place] to be D/C'd SNIF per MD order via EMS, all d/c paperwork given to EMS staff. IV dc'd by EMS staff.   All belongings will be returned to patient for patient to take home.  Last Documented Vital Signs:  Blood pressure 107/64, pulse 82, temperature 97.8 F (36.6 C), temperature source Oral, resp. rate 18, height 5\' 7"  (1.702 m), weight 79.8 kg (175 lb 14.8 oz), SpO2 100 %.  Hendricks Limes RN, BS, BSN

## 2015-05-24 ENCOUNTER — Non-Acute Institutional Stay (SKILLED_NURSING_FACILITY): Payer: Medicaid Other | Admitting: Adult Health

## 2015-05-24 ENCOUNTER — Encounter: Payer: Self-pay | Admitting: Adult Health

## 2015-05-24 DIAGNOSIS — N186 End stage renal disease: Secondary | ICD-10-CM | POA: Diagnosis not present

## 2015-05-24 DIAGNOSIS — G8929 Other chronic pain: Secondary | ICD-10-CM

## 2015-05-24 DIAGNOSIS — G4733 Obstructive sleep apnea (adult) (pediatric): Secondary | ICD-10-CM | POA: Diagnosis not present

## 2015-05-24 DIAGNOSIS — M549 Dorsalgia, unspecified: Secondary | ICD-10-CM | POA: Diagnosis not present

## 2015-05-24 DIAGNOSIS — I4891 Unspecified atrial fibrillation: Secondary | ICD-10-CM | POA: Diagnosis not present

## 2015-05-24 DIAGNOSIS — Z992 Dependence on renal dialysis: Secondary | ICD-10-CM

## 2015-05-24 DIAGNOSIS — J449 Chronic obstructive pulmonary disease, unspecified: Secondary | ICD-10-CM | POA: Insufficient documentation

## 2015-05-24 DIAGNOSIS — N189 Chronic kidney disease, unspecified: Secondary | ICD-10-CM | POA: Diagnosis not present

## 2015-05-24 DIAGNOSIS — I871 Compression of vein: Secondary | ICD-10-CM | POA: Insufficient documentation

## 2015-05-24 DIAGNOSIS — I129 Hypertensive chronic kidney disease with stage 1 through stage 4 chronic kidney disease, or unspecified chronic kidney disease: Secondary | ICD-10-CM | POA: Diagnosis not present

## 2015-05-24 DIAGNOSIS — E1122 Type 2 diabetes mellitus with diabetic chronic kidney disease: Secondary | ICD-10-CM | POA: Diagnosis not present

## 2015-05-24 MED ORDER — LORATADINE 10 MG PO TABS: 10.0000 mg | ORAL_TABLET | Freq: Every day | ORAL | Status: AC

## 2015-05-24 MED ORDER — LABETALOL HCL 200 MG PO TABS: 200.0000 mg | ORAL_TABLET | Freq: Three times a day (TID) | ORAL | Status: DC

## 2015-05-24 NOTE — Progress Notes (Signed)
Patient ID: Noah Lewis, male   DOB: 1951-03-07, 64 y.o.   MRN: [id number]    Facility: [name]      Allergies  Allergen Reactions  . Penicillins Other (See Comments)    unsure    Chief Complaint  Patient presents with  . Hospitalization Follow-up    HPI:  He has been hospitalized for volume overload secondary to esrd and poor adherence to dialysis; new onset afib; svc syndrome and osa. He is here for short term rehab. His desire is to go home; however; housing will be an issue for him. He is not voicing any complaints at this time.    Past Medical History  Diagnosis Date  . Cancer   . Arthritis   . Renal disorder   . Hypertension   . Diabetes mellitus without complication   . Asthma   . COPD (chronic obstructive pulmonary disease)   . Coronary artery disease     Past Surgical History  Procedure Laterality Date  . No past surgeries    . Insertion of dialysis catheter      VITAL SIGNS BP 110/70 mmHg  Pulse 82  Ht 5\' 7"  (1.702 m)  Wt 169 lb (76.658 kg)  BMI 26.46 kg/m2  Patient's Medications  New Prescriptions   No medications on file  Previous Medications   ACETAMINOPHEN (TYLENOL) 325 MG TABLET    Take 1 tablet (325 mg total) by mouth every 6 (six) hours as needed for moderate pain.   ASPIRIN 81 MG CHEWABLE TABLET    Chew 1 tablet (81 mg total) by mouth daily.   CALCIUM ACETATE (PHOSLO) 667 MG CAPSULE    Take 3 capsules (2,001 mg total) by mouth 3 (three) times daily with meals.   CAPSAICIN (ZOSTRIX) 0.025 % CREAM    Apply topically 2 (two) times daily as needed (low back pain).   IPRATROPIUM-ALBUTEROL (DUONEB) 0.5-2.5 (3) MG/3ML SOLN    Take 3 mLs by nebulization every 6 (six) hours as needed.   LABETALOL (NORMODYNE) 200 MG TABLET    Take 2 tablets (400 mg total) by mouth 3 (three) times daily.   MUPIROCIN OINTMENT (BACTROBAN) 2 %    Place 1 application into the nose 2 (two) times daily.   POLYETHYLENE GLYCOL (MIRALAX / GLYCOLAX) PACKET     Take 17 g by mouth daily as needed for mild constipation.   WARFARIN (COUMADIN) 5 MG TABLET    Take 1 tablet (5 mg total) by mouth daily at 6 PM.  Modified Medications   No medications on file  Discontinued Medications   No medications on file     SIGNIFICANT DIAGNOSTIC EXAMS  04-28-15: 2-d echo: Left ventricle: The cavity size was normal. Wall thickness was increased in a pattern of mild LVH. The estimated ejection fraction was 50%. Diffuse hypokinesis. Indeterminant diastolic function (atrial fibrillation). - Aortic valve: There was no stenosis. There was mild regurgitation. - Mitral valve: Mildly calcified annulus. There was mild regurgitation. - Left atrium: The atrium was moderately dilated. - Right ventricle: Poorly visualized. The cavity size was mildly dilated. Systolic function was mildly reduced. - Right atrium: The atrium was mildly dilated. - Tricuspid valve: Peak RV-RA gradient (S): 29 mm Hg. - Pulmonary arteries: PA peak pressure: 44 mm Hg (S). - Systemic veins: IVC measured 2.6 with < 50% respirophasic variation, suggesting RA pressure 15 mmHg.  05-08-15: lumbar spine x-ray: No acute findings. Mild spondylosis of the lumbar spine with disc disease at the L5-S1 level. Moderate  degenerative change of the hips.  05-12-15: chest x-ray: Cardiomegaly with mild interstitial pulmonary edema.    LABS REVIEWED:   04-28-15: hgb a1c 5.7 05-12-15: wbc 6.7 ;hgb 10.4; hct 32.1; mcv 85.1; plt 205; glucose 142; bun 90; creat 14.78; k+4.1; na++141; liver normal albumin 3.7 05-13-15: glucose 79; bun 48; creat 9.18; k+4.3; na++139; ca++8.6; phos 5.4; albumin 3.4 05-15-15; glucose 118; bun 68; creat 11.91; k+3.6; na++143; phos 7.6; allbumin 3.2 05-23-15: wbc 4.5 ;hgb 10.7; hct 32.6; mcv 84.5; plt 146; glucose 159; bun 73; creat 9.62; k+4.6; na++134; phos 6.0; albumin 3.1      Review of Systems  Constitutional: Negative for appetite change and fatigue.  HENT: Negative for congestion.     Respiratory: Negative for cough, chest tightness and shortness of breath.   Cardiovascular: Negative for chest pain, palpitations and leg swelling.  Gastrointestinal: Negative for nausea, abdominal pain, diarrhea and constipation.  Musculoskeletal: Positive for myalgias. Negative for arthralgias.       Has chronic back pain   Skin: Negative for pallor.  Neurological: Negative for dizziness.  Psychiatric/Behavioral: The patient is not nervous/anxious.       Physical Exam  Constitutional: No distress.  Neck: Neck supple. No JVD present. No thyromegaly present.  Cardiovascular: Normal rate, regular rhythm and intact distal pulses.   Respiratory: Effort normal. No respiratory distress. He has no wheezes.  Breath sounds diminished   GI: Soft. Bowel sounds are normal. He exhibits no distension. There is no tenderness.  Musculoskeletal: He exhibits no edema.  Able to move all extremities  Ambulates with a cane; gait is unsteady and slow   Lymphadenopathy:    He has no cervical adenopathy.  Neurological: He is alert.  Skin: Skin is warm and dry. He is not diaphoretic.  Left upper extremity a/v shunt: +thril + bruit   Psychiatric: He has a normal mood and affect.     ASSESSMENT/ PLAN:  1. New onset afib: per his cardiology note; his labetalol needs to be 200 mg three times daily; will have nursing staff check blood pressure and pulse with each dose.  Is on coumadin 5 mg every evening; while he is on coumadin therapy; he will not need asa. If a facility or dialysis is no available he will need to stop the coumadin and begin asa 81 mg daily   2. ESRD on hemodialysis: is followed by nephrology; is on hemodialysis three days per week. Will continue phoslo 667 take 3 tabs with each meal.   3. Hypertension: will continue labetalol 200 mg three times daily and will monitor   4. COPD: will continue duoneb every 6 hours as needed will start claritin 10 mg daily for allergies  5. SVC  syndrome/OSA: has not been consistent with cpap in the past; will need pulmonology consult for sleep study and cpap  6. Chronic back pain: is without change will conitnue capsaicin cream to lower back prior to dialysis and twice daily as needed; will monitor; if his pain is under control he will be able to better tolerate dialysis  7. Constipation:  Will continue miralax daily as needed   8. Diabetes: his hgb a1c is 5.7. Is presently not on medications; will check cbg twice daily and will monitor his status.    Time spent with patient  50   minutes >50% time spent counseling; reviewing medical record; tests; labs; and developing future plan of care    Ok Edwards NP [facility]  Contact [phone] Monday through Friday  8am- 5pm  After hours call [phone]

## 2015-05-29 ENCOUNTER — Encounter (HOSPITAL_COMMUNITY)
Admission: EM | Disposition: A | Discharge status: Skilled Nursing Facility | Payer: Self-pay | Source: Home / Self Care | Attending: Oncology

## 2015-05-29 ENCOUNTER — Emergency Department (HOSPITAL_BASED_OUTPATIENT_CLINIC_OR_DEPARTMENT_OTHER)
Admit: 2015-05-29 | Discharge: 2015-05-29 | Disposition: A | Discharge status: Home / Self Care | Payer: Medicaid Other | Attending: Emergency Medicine | Admitting: Emergency Medicine

## 2015-05-29 ENCOUNTER — Inpatient Hospital Stay: Admit: 2015-05-29 | Payer: Self-pay | Admitting: Vascular Surgery

## 2015-05-29 ENCOUNTER — Inpatient Hospital Stay (HOSPITAL_COMMUNITY): Payer: Medicaid Other | Admitting: Anesthesiology

## 2015-05-29 ENCOUNTER — Inpatient Hospital Stay (HOSPITAL_COMMUNITY)
Admission: EM | Admit: 2015-05-29 | Discharge: 2015-06-02 | DRG: 314 | Disposition: A | Discharge status: Skilled Nursing Facility | Payer: Medicaid Other | Attending: Oncology | Admitting: Oncology

## 2015-05-29 ENCOUNTER — Encounter (HOSPITAL_COMMUNITY): Payer: Self-pay

## 2015-05-29 DIAGNOSIS — Z9114 Patient's other noncompliance with medication regimen: Secondary | ICD-10-CM | POA: Diagnosis present

## 2015-05-29 DIAGNOSIS — I2510 Atherosclerotic heart disease of native coronary artery without angina pectoris: Secondary | ICD-10-CM | POA: Diagnosis present

## 2015-05-29 DIAGNOSIS — Z9115 Patient's noncompliance with renal dialysis: Secondary | ICD-10-CM | POA: Diagnosis present

## 2015-05-29 DIAGNOSIS — G4730 Sleep apnea, unspecified: Secondary | ICD-10-CM | POA: Diagnosis present

## 2015-05-29 DIAGNOSIS — Z7901 Long term (current) use of anticoagulants: Secondary | ICD-10-CM

## 2015-05-29 DIAGNOSIS — Z992 Dependence on renal dialysis: Secondary | ICD-10-CM | POA: Diagnosis not present

## 2015-05-29 DIAGNOSIS — Y838 Other surgical procedures as the cause of abnormal reaction of the patient, or of later complication, without mention of misadventure at the time of the procedure: Secondary | ICD-10-CM | POA: Diagnosis present

## 2015-05-29 DIAGNOSIS — T82868A Thrombosis of vascular prosthetic devices, implants and grafts, initial encounter: Secondary | ICD-10-CM | POA: Diagnosis present

## 2015-05-29 DIAGNOSIS — Z880 Allergy status to penicillin: Secondary | ICD-10-CM

## 2015-05-29 DIAGNOSIS — M7989 Other specified soft tissue disorders: Secondary | ICD-10-CM

## 2015-05-29 DIAGNOSIS — Z419 Encounter for procedure for purposes other than remedying health state, unspecified: Secondary | ICD-10-CM

## 2015-05-29 DIAGNOSIS — J45909 Unspecified asthma, uncomplicated: Secondary | ICD-10-CM | POA: Diagnosis present

## 2015-05-29 DIAGNOSIS — I4891 Unspecified atrial fibrillation: Secondary | ICD-10-CM | POA: Diagnosis present

## 2015-05-29 DIAGNOSIS — Z95828 Presence of other vascular implants and grafts: Secondary | ICD-10-CM

## 2015-05-29 DIAGNOSIS — Z22322 Carrier or suspected carrier of Methicillin resistant Staphylococcus aureus: Secondary | ICD-10-CM

## 2015-05-29 DIAGNOSIS — N186 End stage renal disease: Secondary | ICD-10-CM

## 2015-05-29 DIAGNOSIS — M79609 Pain in unspecified limb: Secondary | ICD-10-CM | POA: Diagnosis not present

## 2015-05-29 DIAGNOSIS — E119 Type 2 diabetes mellitus without complications: Secondary | ICD-10-CM | POA: Diagnosis present

## 2015-05-29 DIAGNOSIS — N2581 Secondary hyperparathyroidism of renal origin: Secondary | ICD-10-CM | POA: Diagnosis present

## 2015-05-29 DIAGNOSIS — I120 Hypertensive chronic kidney disease with stage 5 chronic kidney disease or end stage renal disease: Secondary | ICD-10-CM | POA: Diagnosis present

## 2015-05-29 DIAGNOSIS — D631 Anemia in chronic kidney disease: Secondary | ICD-10-CM | POA: Diagnosis present

## 2015-05-29 DIAGNOSIS — Z79899 Other long term (current) drug therapy: Secondary | ICD-10-CM

## 2015-05-29 DIAGNOSIS — N189 Chronic kidney disease, unspecified: Secondary | ICD-10-CM

## 2015-05-29 DIAGNOSIS — I82729 Chronic embolism and thrombosis of deep veins of unspecified upper extremity: Secondary | ICD-10-CM | POA: Diagnosis present

## 2015-05-29 DIAGNOSIS — R768 Other specified abnormal immunological findings in serum: Secondary | ICD-10-CM | POA: Diagnosis present

## 2015-05-29 DIAGNOSIS — J449 Chronic obstructive pulmonary disease, unspecified: Secondary | ICD-10-CM | POA: Diagnosis present

## 2015-05-29 DIAGNOSIS — E1122 Type 2 diabetes mellitus with diabetic chronic kidney disease: Secondary | ICD-10-CM | POA: Diagnosis present

## 2015-05-29 DIAGNOSIS — T82898A Other specified complication of vascular prosthetic devices, implants and grafts, initial encounter: Secondary | ICD-10-CM | POA: Diagnosis not present

## 2015-05-29 DIAGNOSIS — R911 Solitary pulmonary nodule: Secondary | ICD-10-CM

## 2015-05-29 HISTORY — PX: INSERTION OF DIALYSIS CATHETER: SHX1324

## 2015-05-29 LAB — CBC WITH DIFFERENTIAL/PLATELET
BASOS PCT: 0 % (ref 0–1)
Basophils Absolute: 0 10*3/uL (ref 0.0–0.1)
EOS ABS: 0.3 10*3/uL (ref 0.0–0.7)
EOS PCT: 6 % — AB (ref 0–5)
HCT: 28.6 % — ABNORMAL LOW (ref 39.0–52.0)
Hemoglobin: 9.3 g/dL — ABNORMAL LOW (ref 13.0–17.0)
Lymphocytes Relative: 15 % (ref 12–46)
Lymphs Abs: 0.8 10*3/uL (ref 0.7–4.0)
MCH: 27.6 pg (ref 26.0–34.0)
MCHC: 32.5 g/dL (ref 30.0–36.0)
MCV: 84.9 fL (ref 78.0–100.0)
MONO ABS: 0.7 10*3/uL (ref 0.1–1.0)
MONOS PCT: 11 % (ref 3–12)
Neutro Abs: 4 10*3/uL (ref 1.7–7.7)
Neutrophils Relative %: 68 % (ref 43–77)
PLATELETS: 139 10*3/uL — AB (ref 150–400)
RBC: 3.37 MIL/uL — ABNORMAL LOW (ref 4.22–5.81)
RDW: 16.2 % — AB (ref 11.5–15.5)
WBC: 5.8 10*3/uL (ref 4.0–10.5)

## 2015-05-29 LAB — BASIC METABOLIC PANEL
Anion gap: 16 — ABNORMAL HIGH (ref 5–15)
BUN: 97 mg/dL — ABNORMAL HIGH (ref 6–20)
CALCIUM: 8.6 mg/dL — AB (ref 8.9–10.3)
CO2: 23 mmol/L (ref 22–32)
CREATININE: 12.47 mg/dL — AB (ref 0.61–1.24)
Chloride: 98 mmol/L — ABNORMAL LOW (ref 101–111)
GFR calc Af Amer: 4 mL/min — ABNORMAL LOW (ref >60)
GFR calc non Af Amer: 4 mL/min — ABNORMAL LOW (ref >60)
GLUCOSE: 102 mg/dL — AB (ref 65–99)
Potassium: 5.4 mmol/L — ABNORMAL HIGH (ref 3.5–5.1)
Sodium: 137 mmol/L (ref 135–145)

## 2015-05-29 LAB — PROTIME-INR
INR: 1.88 — AB (ref 0.00–1.49)
PROTHROMBIN TIME: 21.5 s — AB (ref 11.6–15.2)

## 2015-05-29 LAB — GLUCOSE, CAPILLARY: GLUCOSE-CAPILLARY: 85 mg/dL (ref 65–99)

## 2015-05-29 SURGERY — INSERTION OF DIALYSIS CATHETER
Anesthesia: General | Site: Chest

## 2015-05-29 MED ORDER — POLYETHYLENE GLYCOL 3350 17 G PO PACK: 17.0000 g | PACK | Freq: Every day | ORAL | Status: DC | PRN

## 2015-05-29 MED ORDER — HEPARIN SODIUM (PORCINE) 1000 UNIT/ML IJ SOLN
INTRAMUSCULAR | Status: AC
  Filled 2015-05-29: qty 1

## 2015-05-29 MED ORDER — LIDOCAINE-EPINEPHRINE (PF) 1 %-1:200000 IJ SOLN
INTRAMUSCULAR | Status: AC
  Filled 2015-05-29: qty 30

## 2015-05-29 MED ORDER — IPRATROPIUM-ALBUTEROL 0.5-2.5 (3) MG/3ML IN SOLN: 3.0000 mL | Freq: Four times a day (QID) | RESPIRATORY_TRACT | Status: DC | PRN

## 2015-05-29 MED ORDER — FENTANYL CITRATE (PF) 250 MCG/5ML IJ SOLN
INTRAMUSCULAR | Status: AC
  Filled 2015-05-29: qty 5

## 2015-05-29 MED ORDER — SODIUM CHLORIDE 0.9 % IV SOLN
INTRAVENOUS | Status: DC | PRN
  Administered 2015-05-29: 23:00:00 via INTRAVENOUS

## 2015-05-29 MED ORDER — MIDAZOLAM HCL 2 MG/2ML IJ SOLN
INTRAMUSCULAR | Status: AC
  Filled 2015-05-29: qty 4

## 2015-05-29 MED ORDER — LORATADINE 10 MG PO TABS
10.0000 mg | ORAL_TABLET | Freq: Every day | ORAL | Status: DC
  Administered 2015-05-30 – 2015-06-02 (×4): 10 mg via ORAL
  Filled 2015-05-29 (×5): qty 1

## 2015-05-29 MED ORDER — CALCITRIOL 0.5 MCG PO CAPS
1.0000 ug | ORAL_CAPSULE | ORAL | Status: DC
  Administered 2015-05-30 – 2015-06-01 (×2): 1 ug via ORAL
  Filled 2015-05-29: qty 2

## 2015-05-29 MED ORDER — LABETALOL HCL 200 MG PO TABS
200.0000 mg | ORAL_TABLET | Freq: Three times a day (TID) | ORAL | Status: DC
  Administered 2015-05-31 – 2015-06-02 (×5): 200 mg via ORAL
  Filled 2015-05-29 (×7): qty 1

## 2015-05-29 MED ORDER — RENA-VITE PO TABS
1.0000 | ORAL_TABLET | Freq: Every day | ORAL | Status: DC
  Administered 2015-05-30 – 2015-06-01 (×3): 1 via ORAL
  Filled 2015-05-29 (×4): qty 1

## 2015-05-29 MED ORDER — PROPOFOL 10 MG/ML IV BOLUS
INTRAVENOUS | Status: DC | PRN
  Administered 2015-05-29: 150 mg via INTRAVENOUS

## 2015-05-29 MED ORDER — SODIUM CHLORIDE 0.9 % IV SOLN
125.0000 mg | INTRAVENOUS | Status: DC
  Administered 2015-05-30 – 2015-06-01 (×2): 125 mg via INTRAVENOUS
  Filled 2015-05-29 (×4): qty 10

## 2015-05-29 MED ORDER — LIDOCAINE HCL (CARDIAC) 20 MG/ML IV SOLN
INTRAVENOUS | Status: DC | PRN
  Administered 2015-05-29: 100 mg via INTRAVENOUS

## 2015-05-29 MED ORDER — CAPSAICIN 0.025 % EX CREA
TOPICAL_CREAM | Freq: Two times a day (BID) | CUTANEOUS | Status: DC | PRN
  Filled 2015-05-29: qty 56.6

## 2015-05-29 MED ORDER — FENTANYL CITRATE (PF) 100 MCG/2ML IJ SOLN
INTRAMUSCULAR | Status: DC | PRN
  Administered 2015-05-29: 50 ug via INTRAVENOUS

## 2015-05-29 MED ORDER — VANCOMYCIN HCL IN DEXTROSE 1-5 GM/200ML-% IV SOLN
1000.0000 mg | INTRAVENOUS | Status: AC
  Filled 2015-05-29 (×2): qty 200

## 2015-05-29 MED ORDER — ACETAMINOPHEN 325 MG PO TABS: 325.0000 mg | ORAL_TABLET | Freq: Four times a day (QID) | ORAL | Status: DC | PRN

## 2015-05-29 SURGICAL SUPPLY — 41 items
BAG DECANTER FOR FLEXI CONT (MISCELLANEOUS) ×3 IMPLANT
BIOPATCH RED 1 DISK 7.0 (GAUZE/BANDAGES/DRESSINGS) ×2 IMPLANT
BIOPATCH RED 1IN DISK 7.0MM (GAUZE/BANDAGES/DRESSINGS) ×1
BIOPATCH WHT 1IN DISK W/4.0 H (GAUZE/BANDAGES/DRESSINGS) ×2 IMPLANT
CATH CANNON HEMO 15F 50CM (CATHETERS) ×2 IMPLANT
CATH CANNON HEMO 15FR 19 (HEMODIALYSIS SUPPLIES) IMPLANT
CATH CANNON HEMO 15FR 23CM (HEMODIALYSIS SUPPLIES) IMPLANT
CATH CANNON HEMO 15FR 31CM (HEMODIALYSIS SUPPLIES) IMPLANT
CATH CANNON HEMO 15FR 32 (HEMODIALYSIS SUPPLIES) IMPLANT
CATH CANNON HEMO 15FR 32CM (HEMODIALYSIS SUPPLIES) IMPLANT
CATH SOFT-VU 4F 65 STRAIGHT (CATHETERS) IMPLANT
CATH SOFT-VU STRAIGHT 4F 65CM (CATHETERS) ×3
CHLORAPREP W/TINT 26ML (MISCELLANEOUS) ×3 IMPLANT
COVER PROBE W GEL 5X96 (DRAPES) ×4 IMPLANT
DRAPE C-ARM 42X72 X-RAY (DRAPES) ×3 IMPLANT
DRAPE CHEST BREAST 15X10 FENES (DRAPES) ×3 IMPLANT
GAUZE SPONGE 2X2 8PLY STRL LF (GAUZE/BANDAGES/DRESSINGS) ×1 IMPLANT
GAUZE SPONGE 4X4 16PLY XRAY LF (GAUZE/BANDAGES/DRESSINGS) ×3 IMPLANT
GLOVE BIO SURGEON STRL SZ7.5 (GLOVE) ×3 IMPLANT
GLOVE BIOGEL PI IND STRL 8 (GLOVE) ×1 IMPLANT
GLOVE BIOGEL PI INDICATOR 8 (GLOVE) ×2
GOWN STRL REUS W/ TWL LRG LVL3 (GOWN DISPOSABLE) ×2 IMPLANT
GOWN STRL REUS W/TWL LRG LVL3 (GOWN DISPOSABLE) ×6
KIT BASIN OR (CUSTOM PROCEDURE TRAY) ×3 IMPLANT
KIT ROOM TURNOVER OR (KITS) ×3 IMPLANT
NDL 18GX1X1/2 (RX/OR ONLY) (NEEDLE) ×1 IMPLANT
NDL HYPO 25GX1X1/2 BEV (NEEDLE) ×1 IMPLANT
NEEDLE 18GX1X1/2 (RX/OR ONLY) (NEEDLE) ×3 IMPLANT
NEEDLE 22X1 1/2 (OR ONLY) (NEEDLE) ×3 IMPLANT
NEEDLE HYPO 25GX1X1/2 BEV (NEEDLE) ×3 IMPLANT
NS IRRIG 1000ML POUR BTL (IV SOLUTION) ×3 IMPLANT
PACK SURGICAL SETUP 50X90 (CUSTOM PROCEDURE TRAY) ×3 IMPLANT
PAD ARMBOARD 7.5X6 YLW CONV (MISCELLANEOUS) ×6 IMPLANT
SPONGE GAUZE 2X2 STER 10/PKG (GAUZE/BANDAGES/DRESSINGS) ×2
SUT ETHILON 3 0 PS 1 (SUTURE) ×3 IMPLANT
SUT VICRYL 4-0 PS2 18IN ABS (SUTURE) ×3 IMPLANT
SYR 20CC LL (SYRINGE) ×6 IMPLANT
SYR 5ML LL (SYRINGE) ×6 IMPLANT
SYR CONTROL 10ML LL (SYRINGE) ×3 IMPLANT
SYRINGE 10CC LL (SYRINGE) ×3 IMPLANT
WATER STERILE IRR 1000ML POUR (IV SOLUTION) ×3 IMPLANT

## 2015-05-29 NOTE — Consult Note (Signed)
Vascular and Vein Specialist of [facility]  Patient name: Noah Lewis MRN: [id number] DOB: [birth year] Sex: male  REASON FOR CONSULT: Left arm swelling. Needs means for Dialysis.   HPI: Noah Lewis is a 64 y.o. male who was sent to the ED with a swollen left arm and has not had HD for 4 days. Vascular surgery was consulted to evaluate the arm and to place a catheter.  He is a poor historian. His AVF in the left upper arm was placed in [facility]. I do not have those records. He has had catheters placed in his neck and groin in [facility]. I do not have those records either. He tells me that the left arm has been swollen for "years." According to Dr. Joelyn Oms, he had an infiltrate in the let arm and the swelling has become significantly worse, making it difficult to cannulate his fistula.   He last ate at 10:30 AM today.  He says that he is not taking Coumadin, however this is listed on his meds.  Past Medical History  Diagnosis Date  . Cancer   . Arthritis   . Renal disorder   . Hypertension   . Diabetes mellitus without complication   . Asthma   . COPD (chronic obstructive pulmonary disease)   . Coronary artery disease     History reviewed. No pertinent family history.  SOCIAL HISTORY: Social History  Substance Use Topics  . Smoking status: Never Smoker   . Smokeless tobacco: Never Used  . Alcohol Use: No    Allergies  Allergen Reactions  . Penicillins Other (See Comments)    unsure    Current Facility-Administered Medications  Medication Dose Route Frequency Provider Last Rate Last Dose  . [START ON 05/30/2015] calcitRIOL (ROCALTROL) capsule 1 mcg  1 mcg Oral Q T,Th,Sa-HD Shelle Iron, NP      . Derrill Memo ON 05/30/2015] ferric gluconate (NULECIT) 125 mg in sodium chloride 0.9 % 100 mL IVPB  125 mg Intravenous Q T,Th,Sa-HD Shelle Iron, NP      . multivitamin (RENA-VIT) tablet 1 tablet  1 tablet Oral QHS Shelle Iron, NP      . Derrill Memo ON 05/30/2015]  vancomycin (VANCOCIN) IVPB 1000 mg/200 mL premix  1,000 mg Intravenous To OR Angelia Mould, MD       Current Outpatient Prescriptions  Medication Sig Dispense Refill  . acetaminophen (TYLENOL) 325 MG tablet Take 1 tablet (325 mg total) by mouth every 6 (six) hours as needed for moderate pain. 50 tablet 1  . calcium acetate (PHOSLO) 667 MG capsule Take 3 capsules (2,001 mg total) by mouth 3 (three) times daily with meals. 270 capsule 2  . capsaicin (ZOSTRIX) 0.025 % cream Apply topically 2 (two) times daily as needed (low back pain). 60 g 0  . ipratropium-albuterol (DUONEB) 0.5-2.5 (3) MG/3ML SOLN Take 3 mLs by nebulization every 6 (six) hours as needed. 360 mL 2  . labetalol (NORMODYNE) 200 MG tablet Take 1 tablet (200 mg total) by mouth 3 (three) times daily. 90 tablet 0  . warfarin (COUMADIN) 5 MG tablet Take 1 tablet (5 mg total) by mouth daily at 6 PM. 30 tablet 2  . loratadine (CLARITIN) 10 MG tablet Take 1 tablet (10 mg total) by mouth daily. 30 tablet 11  . polyethylene glycol (MIRALAX / GLYCOLAX) packet Take 17 g by mouth daily as needed for mild constipation. 14 each 0    REVIEW OF SYSTEMS: Valu.Nieves ] denotes positive finding; [  ]  denotes negative finding CARDIOVASCULAR:  [ ]  chest pain   [ ]  chest pressure   [ ]  palpitations   [ ]  orthopnea   [ ]  dyspnea on exertion   [ ]  claudication   [ ]  rest pain   [ ]  DVT   [ ]  phlebitis PULMONARY:   [ ]  productive cough   [ ]  asthma   [ ]  wheezing NEUROLOGIC:   [ ]  weakness  [ ]  paresthesias  [ ]  aphasia  [ ]  amaurosis  [ ]  dizziness HEMATOLOGIC:   [ ]  bleeding problems   [ ]  clotting disorders MUSCULOSKELETAL:  [ ]  joint pain   [ ]  joint swelling Valu.Nieves ] left arm swelling GASTROINTESTINAL: [ ]   blood in stool  [ ]   hematemesis GENITOURINARY:  [ ]   dysuria  [ ]   hematuria PSYCHIATRIC:  [ ]  history of major depression INTEGUMENTARY:  [ ]  rashes  [ ]  ulcers CONSTITUTIONAL:  [ ]  fever   [ ]  chills  PHYSICAL EXAM: Filed Vitals:   05/29/15  1415 05/29/15 1430 05/29/15 1500 05/29/15 1530  BP: 140/87 136/104 154/101 144/97  Pulse: 87 84 91   Temp:      TempSrc:      Resp:      SpO2: 100% 100% 100%    There is no weight on file to calculate BMI. GENERAL: The patient is a well-nourished male, in no acute distress. The vital signs are documented above. CARDIAC: There is a regular rate and rhythm.  VASCULAR: Massively swollen left arm. Pulsatile left upper arm AVF PULMONARY: There is good air exchange bilaterally without wheezing or rales. ABDOMEN: Soft and non-tender with normal pitched bowel sounds.  MUSCULOSKELETAL: There are no major deformities. NEUROLOGIC: No focal weakness or paresthesias are detected. SKIN: There are no ulcers or rashes noted. PSYCHIATRIC: The patient has a normal affect.  DATA:  Lab Results  Component Value Date   WBC 5.8 05/29/2015   HGB 9.3* 05/29/2015   HCT 28.6* 05/29/2015   MCV 84.9 05/29/2015   PLT 139* 05/29/2015   Lab Results  Component Value Date   NA 137 05/29/2015   K 5.4* 05/29/2015   CL 98* 05/29/2015   CO2 23 05/29/2015   Lab Results  Component Value Date   CREATININE 12.47* 05/29/2015   Lab Results  Component Value Date   INR 1.88* 05/29/2015   INR 2.63* 05/22/2015   INR 2.63* 05/21/2015   Lab Results  Component Value Date   HGBA1C 5.7* 04/28/2015   VENOUS DUPLEX LEFT UPPER EXTREMITY: pend.   MEDICAL ISSUES:  MASSIVE SWELLING OF LEFT UPPER EXTREMITY: He has a history of chronic swelling in that arm and has reportedly had a recent infiltrate. Will need to not use arm until swelling significantly improved. Elevate arm above the heart. He may need a fistulogram in the future to rule out a central venous stenosis.   NEEDS HEMODIALYSIS: He will need a catheter. It concerns me that he has required femoral catheters is the past and he may have some central venous occlusion. However, his work has been done in [employer] and [employer] and I do not have those records.  Will look with ultrasound and see if IJ's are patent. If unable to place Catheter in neck, may need femoral catheter. I have discussed the procedure and risks with the patient.   Deitra Mayo [facility] of [place]: [phone]

## 2015-05-29 NOTE — Progress Notes (Signed)
VASCULAR LAB PRELIMINARY  PRELIMINARY  PRELIMINARY  PRELIMINARY  Left upper extremity venous duplex completed.    Preliminary report:  Left:  No obvious evidence of DVT or superficial thrombosis.  Limited by edema and patient unable to move arm very far.  Antoniette Peake, RVT 05/29/2015, 3:54 PM

## 2015-05-29 NOTE — ED Notes (Signed)
Pt uncooperative in regards to answering questions for neuro and pain assessments.

## 2015-05-29 NOTE — H&P (Signed)
Date: 05/29/2015               Patient Name:  Noah Lewis MRN: [id number]  DOB: 26-Dec-1950 Age / Sex: 64 y.o., male   PCP: Provider Not In System         Medical Service: Internal Medicine Teaching Service         Attending Physician: Dr. Annia Belt, MD    First Contact: Dr. Charlynn Grimes Pager: [phone]  Second Contact: Dr. Naaman Plummer Pager: [phone]       After Hours (After 5p/  First Contact Pager: [phone]  weekends / holidays): Second Contact Pager: [phone]   Chief Complaint: left arm swelling  History of Present Illness: Noah Lewis is a 64 yo male with ESRD, SVC Syndrome, DM type II, HTN, and COPD, presenting from dialysis with left arm swelling.  He has an AVF in place for dialysis access.  He reports worsening swelling, warmth, and pain of the LUE for 7 months.  The fistula has continued to be used for dialysis during that time.  His last dialysis was Saturday (TTS), but he reports only receiving about 30 minutes of dialysis because the lines were clogging with clots.  He has been using his Capsaicin back cream on his LUE without relief.    He endorses occasional chills on 9/3, but denies fever, N/V, CP, SOB, or abdominal pain.  Vascular surgery was consulted in the ED, with plans to place HD catheter tonight.  Nephrology was consulted in the ED, with plans to dialyze patient after catheter placement.  Preliminary read of LUE doppler did not demonstrate DVT or thrombosis.  Study was limited by edema and patient ROM.  Of note, patient was discharged from [facility] 8/10, where he had right sided SVC syndrome, partially resolved following RUE portocath removal.  However, nephrology progress notes from that admission also denote "chronic L arm and neck swelling."   Meds: Current Facility-Administered Medications  Medication Dose Route Frequency Provider Last Rate Last Dose  . [START ON 05/30/2015] calcitRIOL (ROCALTROL) capsule 1 mcg  1 mcg Oral Q T,Th,Sa-HD Shelle Iron, NP        . Derrill Memo ON 05/30/2015] ferric gluconate (NULECIT) 125 mg in sodium chloride 0.9 % 100 mL IVPB  125 mg Intravenous Q T,Th,Sa-HD Shelle Iron, NP      . multivitamin (RENA-VIT) tablet 1 tablet  1 tablet Oral QHS Shelle Iron, NP      . Derrill Memo ON 05/30/2015] vancomycin (VANCOCIN) IVPB 1000 mg/200 mL premix  1,000 mg Intravenous To OR Angelia Mould, MD        Allergies: Allergies as of 05/29/2015 - Review Complete 05/29/2015  Allergen Reaction Noted  . Penicillins Other (See Comments) 10/18/2012   Past Medical History  Diagnosis Date  . Cancer   . Arthritis   . Renal disorder   . Hypertension   . Diabetes mellitus without complication   . Asthma   . COPD (chronic obstructive pulmonary disease)   . Coronary artery disease    Past Surgical History  Procedure Laterality Date  . No past surgeries    . Insertion of dialysis catheter     History reviewed. No pertinent family history. Social History   Social History  . Marital Status: Married    Spouse Name: N/A  . Number of Children: N/A  . Years of Education: N/A   Occupational History  . Not on file.   Social History Main Topics  . Smoking status: Never Smoker   .  Smokeless tobacco: Never Used  . Alcohol Use: No  . Drug Use: No  . Sexual Activity: Not on file   Other Topics Concern  . Not on file   Social History Narrative    Review of Systems: Pertinent items are noted in HPI.  Physical Exam: Blood pressure 162/103, pulse 91, temperature 98.5 F (36.9 C), temperature source Oral, resp. rate 22, SpO2 100 %. Physical Exam  Constitutional: He is oriented to person, place, and time and well-developed, well-nourished, and in no distress.  HENT:  Head: Normocephalic and atraumatic.  Eyes: EOM are normal.  Neck: Normal range of motion. No tracheal deviation present.  Cardiovascular: Normal rate, regular rhythm, normal heart sounds and intact distal pulses.   Pulmonary/Chest: Effort normal. No respiratory  distress. He has no wheezes.  Occasional coarse inspiratory breath sounds.  Abdominal: Soft. He exhibits no distension. There is no tenderness. There is no rebound and no guarding.  Musculoskeletal:  LUE with prominent nonpitting edema from left pectoralis to fingertips.  Shoulder and upper arm extremely sensitive to palpation.  Poorly demarcated erythema and warmth of left shoulder and axilla.  Bruit present over AVF.  No left radial pulse palpable.    Neurological: He is alert and oriented to person, place, and time.  Skin: Skin is warm and dry.     Lab results: Basic Metabolic Panel:  Recent Labs  05/29/15 1450  NA 137  K 5.4*  CL 98*  CO2 23  GLUCOSE 102*  BUN 97*  CREATININE 12.47*  CALCIUM 8.6*   Liver Function Tests: No results for input(s): AST, ALT, ALKPHOS, BILITOT, PROT, ALBUMIN in the last 72 hours. No results for input(s): LIPASE, AMYLASE in the last 72 hours. No results for input(s): AMMONIA in the last 72 hours. CBC:  Recent Labs  05/29/15 1450  WBC 5.8  NEUTROABS 4.0  HGB 9.3*  HCT 28.6*  MCV 84.9  PLT 139*   Cardiac Enzymes: No results for input(s): CKTOTAL, CKMB, CKMBINDEX, TROPONINI in the last 72 hours. BNP: No results for input(s): PROBNP in the last 72 hours. D-Dimer: No results for input(s): DDIMER in the last 72 hours. CBG: No results for input(s): GLUCAP in the last 72 hours. Hemoglobin A1C: No results for input(s): HGBA1C in the last 72 hours. Fasting Lipid Panel: No results for input(s): CHOL, HDL, LDLCALC, TRIG, CHOLHDL, LDLDIRECT in the last 72 hours. Thyroid Function Tests: No results for input(s): TSH, T4TOTAL, FREET4, T3FREE, THYROIDAB in the last 72 hours. Anemia Panel: No results for input(s): VITAMINB12, FOLATE, FERRITIN, TIBC, IRON, RETICCTPCT in the last 72 hours. Coagulation:  Recent Labs  05/29/15 1450  LABPROT 21.5*  INR 1.88*   Urine Drug Screen: Drugs of Abuse     Component Value Date/Time   LABOPIA NONE  DETECTED 10/26/2007 1400   COCAINSCRNUR NONE DETECTED 10/26/2007 1400   LABBENZ NONE DETECTED 10/26/2007 1400   AMPHETMU NONE DETECTED 10/26/2007 1400   THCU NONE DETECTED 10/26/2007 1400   LABBARB  10/26/2007 1400    NONE DETECTED        DRUG SCREEN FOR MEDICAL PURPOSES ONLY.  IF CONFIRMATION IS NEEDED FOR ANY PURPOSE, NOTIFY LAB WITHIN 5 DAYS.    Alcohol Level: No results for input(s): ETH in the last 72 hours. Urinalysis: No results for input(s): COLORURINE, LABSPEC, PHURINE, GLUCOSEU, HGBUR, BILIRUBINUR, KETONESUR, PROTEINUR, UROBILINOGEN, NITRITE, LEUKOCYTESUR in the last 72 hours.  Invalid input(s): APPERANCEUR   Imaging results:  No results found.   Assessment & Plan by  Problem: Active Problems:   End-stage renal disease needing dialysis  Mr. Soderholm is a 64 yo male with ESRD, SVC Syndrome, DM type II, HTN, and COPD, presenting from dialysis with left arm swelling.   LUE Swelling: Etiology unclear.  Doppler negative for DVT.  Thrill present overlying AVF, therefore, fistula does not seem to be occluded.  However, prominent edema of full LUE and left chest would suggest venous/lymph drainage occlusion.  Erythema is also concerning finding, though patient is afebrile and no leukocytosis.  Therefore, erythema felt to be more due to swelling than cellulitis at this time.  Given h/o right sided SVC syndrome and smoking history, there is concern for underlying lung cancer.  Patient had a CT chest in 2009, noting 2 small pulmonary nodules (RUL and LLL), with recommendation of 6 month repeat CT chest followup. No repeat CT chest is in the system. - Recommend CT chest with/without contrast to evaluate pulmonary nodules and LUE vasculature - Vascular surgery consult, appreciate rec's  ESRD (TTS): Last session Saturday, though session was short (approx 30 minutes per patient).  Nephrology consulted with plan to dialyze following HD cath placement by vascular surgery.  Patient has h/o  noncompliance with HD, though that does not seem to be the issue at this time.   - Nephrology consult - Dialysis following HD catheter placement  Atrial Fibrillation: Patient discharged 8/31 on warfarin, but patient denies taking it after discharge. INR 1.88 on admission. Will resume warfarin after OR.  Recommend discussion with patient about need for anticoagulation with warfarin. - Continue Labetolol to 200 mg TID - Resume warfarin following HD cath placement - ECG in AM  Possible Obstructive Sleep Apnea: Will need sleep study as an outpatient in order to obtain CPAP. - intermittently using CPAP  Type 2 Diabetes: No medications. Recent A1c of 5.7. CBGs stable during last admission, and only received 1 unit insulin.  HTN: stable.  - continue Labetolol 200 mg TID  FEN/GI - NPO pending HD cath placement - Renal diet following HD cath placement  DVT Ppx: warfarin  Dispo: Disposition is deferred at this time, awaiting improvement of current medical problems.   The patient does not have a current PCP (Provider Not In System) and does need an [facility] hospital follow-up appointment after discharge.  The patient does not have transportation limitations that hinder transportation to clinic appointments.  Signed: Iline Oven, MD, PhD 05/29/2015, 7:17 PM

## 2015-05-29 NOTE — ED Notes (Signed)
Rn attempted to obtain consent, pt staring at nurse saying he doesn't know if he wants to sign. Pt threatening to order his own food that he is a grown man and he will eat whenever he wants. RN informed him he cannot eat due to the scheduled surgery today.

## 2015-05-29 NOTE — ED Notes (Signed)
Ultrasound at bedside

## 2015-05-29 NOTE — ED Notes (Signed)
PTAR- pt coming from [facility] with c/o left arm swelling. Pt has dialysis graft in left arm with pain. Unable to palpate radial pulse. Pt uncooperative when asking triage questions.

## 2015-05-29 NOTE — Anesthesia Preprocedure Evaluation (Addendum)
Anesthesia Evaluation  Patient identified by MRN, date of birth, ID band Patient awake    Reviewed: Allergy & Precautions, NPO status , Patient's Chart, lab work & pertinent test results  Airway Mallampati: III  TM Distance: >3 FB Neck ROM: Full    Dental   Pulmonary asthma , sleep apnea , COPD breath sounds clear to auscultation        Cardiovascular hypertension, Pt. on medications and Pt. on home beta blockers + CAD Rhythm:Regular Rate:Normal     Neuro/Psych Depression Bipolar Disorder negative neurological ROS     GI/Hepatic negative GI ROS, Neg liver ROS,   Endo/Other  diabetes, Type 2  Renal/GU ESRFRenal disease     Musculoskeletal  (+) Arthritis -,   Abdominal   Peds  Hematology  (+) anemia ,   Anesthesia Other Findings   Reproductive/Obstetrics                            Anesthesia Physical Anesthesia Plan  ASA: III  Anesthesia Plan: General   Post-op Pain Management:    Induction: Intravenous  Airway Management Planned: LMA  Additional Equipment:   Intra-op Plan:   Post-operative Plan: Extubation in OR  Informed Consent: I have reviewed the patients History and Physical, chart, labs and discussed the procedure including the risks, benefits and alternatives for the proposed anesthesia with the patient or authorized representative who has indicated his/her understanding and acceptance.   Dental advisory given  Plan Discussed with: CRNA  Anesthesia Plan Comments:        Anesthesia Quick Evaluation

## 2015-05-29 NOTE — ED Provider Notes (Signed)
CSN: [id number]     Arrival date & time 05/29/15  1313 History   First MD Initiated Contact with Patient 05/29/15 1321     Chief Complaint  Patient presents with  . Arm Swelling     (Consider location/radiation/quality/duration/timing/severity/associated sxs/prior Treatment) HPI Comments: 64 year old male with history of noncompliance medication, end-stage renal disease on hemodialysis, noncompliance with hemodialysis, SVC syndrome, on Coumadin presents from dialysis due to needing access from worsening left arm swelling. Patient very vague historian and poor hearing. Patient denies any chest pain or shortness of breath no fevers or chills. Patient has had swelling in the arm for months however this is worse the past week. Patient unsure if DVT history. Nothing is improved the swelling in the left arm.  The history is provided by the patient.    Past Medical History  Diagnosis Date  . Cancer   . Arthritis   . Renal disorder   . Hypertension   . Diabetes mellitus without complication   . Asthma   . COPD (chronic obstructive pulmonary disease)   . Coronary artery disease    Past Surgical History  Procedure Laterality Date  . No past surgeries    . Insertion of dialysis catheter    . Insertion of dialysis catheter N/A 05/29/2015    Procedure: INSERTION OF DIALYSIS CATHETER;  Surgeon: Angelia Mould, MD;  Location: [place];  Service: Vascular;  Laterality: N/A;   History reviewed. No pertinent family history. Social History  Substance Use Topics  . Smoking status: Never Smoker   . Smokeless tobacco: Never Used  . Alcohol Use: No    Review of Systems  Constitutional: Positive for fatigue. Negative for fever and chills.  HENT: Negative for congestion.   Eyes: Negative for visual disturbance.  Respiratory: Negative for shortness of breath.   Cardiovascular: Negative for chest pain.  Gastrointestinal: Negative for vomiting and abdominal pain.  Genitourinary: Negative for  dysuria and flank pain.  Musculoskeletal: Negative for back pain, neck pain and neck stiffness.  Skin: Negative for rash.  Neurological: Negative for light-headedness and headaches.      Allergies  Penicillins  Home Medications   Prior to Admission medications   Medication Sig Start Date End Date Taking? Authorizing Provider  acetaminophen (TYLENOL) 325 MG tablet Take 1 tablet (325 mg total) by mouth every 6 (six) hours as needed for moderate pain. 05/23/15  Yes Liberty Handy, MD  calcium acetate (PHOSLO) 667 MG capsule Take 3 capsules (2,001 mg total) by mouth 3 (three) times daily with meals. 05/23/15  Yes Liberty Handy, MD  capsaicin (ZOSTRIX) 0.025 % cream Apply topically 2 (two) times daily as needed (low back pain). 05/23/15  Yes Liberty Handy, MD  ipratropium-albuterol (DUONEB) 0.5-2.5 (3) MG/3ML SOLN Take 3 mLs by nebulization every 6 (six) hours as needed. 05/23/15  Yes Liberty Handy, MD  labetalol (NORMODYNE) 200 MG tablet Take 1 tablet (200 mg total) by mouth 3 (three) times daily. 05/24/15  Yes Gerlene Fee, NP  warfarin (COUMADIN) 5 MG tablet Take 1 tablet (5 mg total) by mouth daily at 6 PM. 05/23/15  Yes Liberty Handy, MD  loratadine (CLARITIN) 10 MG tablet Take 1 tablet (10 mg total) by mouth daily. 05/24/15   Gerlene Fee, NP  polyethylene glycol (MIRALAX / GLYCOLAX) packet Take 17 g by mouth daily as needed for mild constipation. 05/23/15   Liberty Handy, MD   BP 156/97 mmHg  Pulse 94  Temp(Src) 97.5 F (36.4 C) (Oral)  Resp 22  Wt 181 lb 1 oz (82.13 kg)  SpO2 100% Physical Exam  Constitutional: He is oriented to person, place, and time. He appears well-developed and well-nourished.  HENT:  Head: Normocephalic and atraumatic.  Eyes: Conjunctivae are normal. Right eye exhibits no discharge. Left eye exhibits no discharge.  Neck: Normal range of motion. Neck supple. No tracheal deviation present.  Cardiovascular: Normal rate and regular rhythm.   Pulmonary/Chest: Effort normal  and breath sounds normal.  Abdominal: Soft. He exhibits no distension. There is no tenderness. There is no guarding.  Musculoskeletal: He exhibits edema.  Neurological: He is alert and oriented to person, place, and time.  Skin: Skin is warm. No rash noted.  Patient has significant 3+ swelling left upper extremity no drainage or sign of active infection. Warm to palpation.  Psychiatric: He has a normal mood and affect.  Nursing note and vitals reviewed.   ED Course  Procedures (including critical care time) Labs Review Labs Reviewed  BASIC METABOLIC PANEL - Abnormal; Notable for the following:    Potassium 5.4 (*)    Chloride 98 (*)    Glucose, Bld 102 (*)    BUN 97 (*)    Creatinine, Ser 12.47 (*)    Calcium 8.6 (*)    GFR calc non Af Amer 4 (*)    GFR calc Af Amer 4 (*)    Anion gap 16 (*)    All other components within normal limits  CBC WITH DIFFERENTIAL/PLATELET - Abnormal; Notable for the following:    RBC 3.37 (*)    Hemoglobin 9.3 (*)    HCT 28.6 (*)    RDW 16.2 (*)    Platelets 139 (*)    Eosinophils Relative 6 (*)    All other components within normal limits  PROTIME-INR - Abnormal; Notable for the following:    Prothrombin Time 21.5 (*)    INR 1.88 (*)    All other components within normal limits  BASIC METABOLIC PANEL - Abnormal; Notable for the following:    Potassium 6.0 (*)    Chloride 100 (*)    BUN 95 (*)    Creatinine, Ser 13.13 (*)    Calcium 8.6 (*)    GFR calc non Af Amer 3 (*)    GFR calc Af Amer 4 (*)    Anion gap 17 (*)    All other components within normal limits  CBC - Abnormal; Notable for the following:    RBC 3.42 (*)    Hemoglobin 9.1 (*)    HCT 28.9 (*)    RDW 16.2 (*)    All other components within normal limits  FERRITIN - Abnormal; Notable for the following:    Ferritin 406 (*)    All other components within normal limits  IRON AND TIBC - Abnormal; Notable for the following:    Iron 29 (*)    Saturation Ratios 10 (*)    All  other components within normal limits  RETICULOCYTES - Abnormal; Notable for the following:    RBC. 3.51 (*)    All other components within normal limits  GLUCOSE, CAPILLARY - Abnormal; Notable for the following:    Glucose-Capillary 140 (*)    All other components within normal limits  RENAL FUNCTION PANEL - Abnormal; Notable for the following:    Chloride 96 (*)    BUN 57 (*)    Creatinine, Ser 8.78 (*)    Calcium 8.4 (*)    Phosphorus 7.2 (*)  Albumin 3.2 (*)    GFR calc non Af Amer 6 (*)    GFR calc Af Amer 7 (*)    All other components within normal limits  CBC - Abnormal; Notable for the following:    RBC 3.28 (*)    Hemoglobin 8.8 (*)    HCT 28.0 (*)    RDW 16.2 (*)    Platelets 124 (*)    All other components within normal limits  GLUCOSE, CAPILLARY - Abnormal; Notable for the following:    Glucose-Capillary 141 (*)    All other components within normal limits  GLUCOSE, CAPILLARY - Abnormal; Notable for the following:    Glucose-Capillary 132 (*)    All other components within normal limits  MRSA PCR SCREENING  HIV ANTIBODY (ROUTINE TESTING)  GLUCOSE, CAPILLARY  GLUCOSE, CAPILLARY  MAGNESIUM  TECHNOLOGIST SMEAR REVIEW  GLUCOSE, CAPILLARY  GLUCOSE, CAPILLARY    Imaging Review Ct Chest W Contrast  05/31/2015   CLINICAL DATA:  Left arm, pulmonary nodule.  EXAM: CT CHEST WITH CONTRAST  TECHNIQUE: Multidetector CT imaging of the chest was performed during intravenous contrast administration.  CONTRAST:  169mL OMNIPAQUE IOHEXOL 300 MG/ML  SOLN  COMPARISON:  CT 08/15/2014  FINDINGS: Mediastinum/Nodes: Mildly enlarged lymph nodes in the LEFT axilla measuring 12 mm (image 24, series 2 close. Moderately enlarged LEFT sub pectoralis lymph node measuring 17 mm short axis. There is thickening of the LEFT pectoralis muscle and soft tissues skin thickening overall LEFT chest wall.  There is a stent in the distal LEFT subclavian vein and proximal brachiocephalic vein. No no flow  evident through this stent.  Venous collateral circulation along the RIGHT anterior chest wall associated with the RIGHT arm injection. With dilution of the RIGHT subclavian vein additionally. Collateral vasculature enters the superior vena cava of from through an internal mammary vein on the RIGHT.  No to paratracheal adenopathy. Enlarged subcarinal lymph node measures 14 mm. No central pulmonary embolism. No pericardial fluid.  Lungs/Pleura: There is diffuse ground-glass opacity within the lungs which is not changed from prior. Previous described LEFT upper lobe nodule has resolved. No new nodularity  Upper abdomen: There is a central venous line from inferior approach through the inferior vena cava with tips in the RIGHT atrium and distal SVC.  Musculoskeletal: No aggressive osseous lesion. There is fat density. For does with bridging osteophytosis of the spine.  IMPRESSION: 1. 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 2. Soft tissue swelling in the LEFT chest wall likely related to venous occlusion. There is prominent adenopathy in LEFT axilla and sub pectoralis location. Presumably this relates to venous occlusion. Cannot exclude a malignant process but is felt less likely. The adenopathy and swelling is new from 08/15/2014 3. Central venous line from a inferior approach with tip is in the RIGHT atrium and distal SVC. 4. Diffuse ground-glass opacities not changed from prior. 5. Resolution of LEFT upper lobe pulmonary nodule.  Ureteral   Electronically Signed   By: Suzy Bouchard M.D.   On: 05/31/2015 16:13   I have personally reviewed and evaluated these images and lab results as part of my medical decision-making.   EKG Interpretation None      MDM   Final diagnoses:  Left arm swelling  ESRD needing dialysis   Patient presents from nephrology for further evaluation from vascular surgery for access. Discussed with nephrology who recommended medicine admit, vascular surgery evaluated  to plan for line placement. Potassium only mildly elevated. Anemia chronic.  The patients results and plan were reviewed and discussed.   Any x-rays performed were independently reviewed by myself.   Differential diagnosis were considered with the presenting HPI.  Medications  multivitamin (RENA-VIT) tablet 1 tablet (1 tablet Oral Given 05/31/15 2119)  ferric gluconate (NULECIT) 125 mg in sodium chloride 0.9 % 100 mL IVPB (125 mg Intravenous Given 05/30/15 1006)  calcitRIOL (ROCALTROL) capsule 1 mcg (1 mcg Oral Given 05/30/15 1233)  vancomycin (VANCOCIN) IVPB 1000 mg/200 mL premix (not administered)  labetalol (NORMODYNE) tablet 200 mg (200 mg Oral Given 05/31/15 2119)  loratadine (CLARITIN) tablet 10 mg (10 mg Oral Given 05/31/15 1113)  acetaminophen (TYLENOL) tablet 325 mg (not administered)  capsaicin (ZOSTRIX) 0.025 % cream (not administered)  ipratropium-albuterol (DUONEB) 0.5-2.5 (3) MG/3ML nebulizer solution 3 mL (not administered)  polyethylene glycol (MIRALAX / GLYCOLAX) packet 17 g (not administered)  oxyCODONE-acetaminophen (PERCOCET/ROXICET) 5-325 MG per tablet 1-2 tablet (2 tablets Oral Given 05/31/15 1554)  aspirin chewable tablet 81 mg (81 mg Oral Given 05/31/15 1113)  heparin injection 5,000 Units (5,000 Units Subcutaneous Not Given 06/01/15 0514)  calcium acetate (PHOSLO) capsule 2,001 mg (2,001 mg Oral Not Given 05/31/15 1819)  iohexol (OMNIPAQUE) 300 MG/ML solution 80 mL (100 mLs Intravenous Contrast Given 05/31/15 0323)    Filed Vitals:   05/31/15 1019 05/31/15 1700 05/31/15 2117 06/01/15 0513  BP: 129/73 118/68 134/77 156/97  Pulse: 85 86 77 94  Temp: 98 F (36.7 C) 98.2 F (36.8 C) 97.9 F (36.6 C) 97.5 F (36.4 C)  TempSrc: Oral Oral Oral Oral  Resp: 20 18 20 22   Weight:   181 lb 1 oz (82.13 kg)   SpO2: 96% 96% 100% 100%    Final diagnoses:  Left arm swelling  ESRD needing dialysis    Admission/ observation were discussed with the admitting physician, patient and/or  family and they are comfortable with the plan.      Elnora Morrison, MD 06/01/15 [phone]

## 2015-05-29 NOTE — ED Notes (Signed)
Obtained informed consent for dialysis catheter placement.

## 2015-05-29 NOTE — Consult Note (Signed)
Indication for Consultation:  Management of ESRD/hemodialysis; anemia, hypertension/volume and secondary hyperparathyroidism  HPI: Noah Lewis is a 64 y.o. male. Who was sent to the ED from his outpt HD center for evaluation of significant swelling in L arm- access arm. Pt reports he has had swelling in that arm for years, he initially says swelling is worse now but then says this is his baseline. He receives HD TTS at [facility], he did not get HD Saturday because of arm swelling and pain and he refused to come to the ED for evaluation. History of HTN, DM, COPD, and noncompliance with HD, currently in a SNF for rehab. He will need a catheter for HD access and evaluation of AVF. Will arrange HD  Past Medical History  Diagnosis Date  . Cancer   . Arthritis   . Renal disorder   . Hypertension   . Diabetes mellitus without complication   . Asthma   . COPD (chronic obstructive pulmonary disease)   . Coronary artery disease    Past Surgical History  Procedure Laterality Date  . No past surgeries    . Insertion of dialysis catheter     History reviewed. No pertinent family history. Social History:  reports that he has never smoked. He has never used smokeless tobacco. He reports that he does not drink alcohol or use illicit drugs. Allergies  Allergen Reactions  . Penicillins Other (See Comments)    unsure   Prior to Admission medications   Medication Sig Start Date End Date Taking? Authorizing Provider  acetaminophen (TYLENOL) 325 MG tablet Take 1 tablet (325 mg total) by mouth every 6 (six) hours as needed for moderate pain. 05/23/15  Yes Liberty Handy, MD  calcium acetate (PHOSLO) 667 MG capsule Take 3 capsules (2,001 mg total) by mouth 3 (three) times daily with meals. 05/23/15  Yes Liberty Handy, MD  capsaicin (ZOSTRIX) 0.025 % cream Apply topically 2 (two) times daily as needed (low back pain). 05/23/15  Yes Liberty Handy, MD  ipratropium-albuterol (DUONEB) 0.5-2.5 (3) MG/3ML SOLN Take 3 mLs  by nebulization every 6 (six) hours as needed. 05/23/15  Yes Liberty Handy, MD  labetalol (NORMODYNE) 200 MG tablet Take 1 tablet (200 mg total) by mouth 3 (three) times daily. 05/24/15  Yes Gerlene Fee, NP  warfarin (COUMADIN) 5 MG tablet Take 1 tablet (5 mg total) by mouth daily at 6 PM. 05/23/15  Yes Liberty Handy, MD  loratadine (CLARITIN) 10 MG tablet Take 1 tablet (10 mg total) by mouth daily. 05/24/15   Gerlene Fee, NP  polyethylene glycol (MIRALAX / GLYCOLAX) packet Take 17 g by mouth daily as needed for mild constipation. 05/23/15   Liberty Handy, MD   No current facility-administered medications for this encounter.   Current Outpatient Prescriptions  Medication Sig Dispense Refill  . acetaminophen (TYLENOL) 325 MG tablet Take 1 tablet (325 mg total) by mouth every 6 (six) hours as needed for moderate pain. 50 tablet 1  . calcium acetate (PHOSLO) 667 MG capsule Take 3 capsules (2,001 mg total) by mouth 3 (three) times daily with meals. 270 capsule 2  . capsaicin (ZOSTRIX) 0.025 % cream Apply topically 2 (two) times daily as needed (low back pain). 60 g 0  . ipratropium-albuterol (DUONEB) 0.5-2.5 (3) MG/3ML SOLN Take 3 mLs by nebulization every 6 (six) hours as needed. 360 mL 2  . labetalol (NORMODYNE) 200 MG tablet Take 1 tablet (200 mg total) by mouth 3 (three) times daily. 90 tablet 0  .  warfarin (COUMADIN) 5 MG tablet Take 1 tablet (5 mg total) by mouth daily at 6 PM. 30 tablet 2  . loratadine (CLARITIN) 10 MG tablet Take 1 tablet (10 mg total) by mouth daily. 30 tablet 11  . polyethylene glycol (MIRALAX / GLYCOLAX) packet Take 17 g by mouth daily as needed for mild constipation. 14 each 0   Labs: Basic Metabolic Panel:  Recent Labs Lab 05/23/15 0920  NA 134*  K 4.6  CL 96*  CO2 25  GLUCOSE 159*  BUN 73*  CREATININE 9.62*  CALCIUM 8.5*  PHOS 6.0*   Liver Function Tests:  Recent Labs Lab 05/23/15 0920  ALBUMIN 3.1*   No results for input(s): LIPASE, AMYLASE in the  last 168 hours. No results for input(s): AMMONIA in the last 168 hours. CBC:  Recent Labs Lab 05/23/15 0921  WBC 4.5  NEUTROABS 2.8  HGB 10.7*  HCT 32.6*  MCV 84.5  PLT 146*   Cardiac Enzymes: No results for input(s): CKTOTAL, CKMB, CKMBINDEX, TROPONINI in the last 168 hours. CBG:  Recent Labs Lab 05/22/15 2017 05/23/15 0729 05/23/15 1510  GLUCAP 122* 68 109*   Iron Studies: No results for input(s): IRON, TIBC, TRANSFERRIN, FERRITIN in the last 72 hours. Studies/Results: No results found.  Review of Systems: Denies chest pain/sob.  Reports chronic L arm swelling/ mild pain in L arm Denies n/v/abd pain. Reports good appetite  Physical Exam: Filed Vitals:   05/29/15 1322 05/29/15 1330 05/29/15 1415 05/29/15 1430  BP: 158/126 136/84 140/87 136/104  Pulse: 84 88 87 84  Temp: 98.5 F (36.9 C)     TempSrc: Oral     Resp: 22     SpO2: 99% 100% 100% 100%     General: Well developed, well nourished, in no acute distress. Head: Normocephalic, atraumatic, sclera non-icteric, mucus membranes are moist Neck: Supple. JVD not elevated. Lungs: Scattered rhonchi Heart: RRR with S1 S2. No murmurs, rubs, or gallops appreciated. Abdomen: Soft, non-tender, non-distended with normoactive bowel sounds. No rebound/guarding. No obvious abdominal masses. M-S:  Strength and tone appear normal for age. Lower extremities: trace LE edema  Neuro: Alert and oriented X 3. Moves all extremities spontaneously. Psych:  Does not respond to all questions, uncooperative at times Dialysis Access: L AVF +b. Significant L arm edema  Dialysis Orders: [facility] 4hr 15 mins    79.5kgs   2K/@.25Ca+  L AVG   4700 u heparin venofer 100 x10- start 9.6 Calcitriol 1   Assessment/Plan: 1.   L arm swelling - VVS consulted to eval. Will need HD cath.  2.  ESRD -  [facility], labs pending 3.  Hypertension/volume  - came in under edw at 77.8 kgs 9/1 - needs lower edw 4.  Anemia  - start Fe bolus tomorrow-  tsat 24 5.  Metabolic bone disease -  phos 6.4 and pth 559- cont calcitriol and phoslo 6.  Nutrition - vitamin, renal diet. Reports good appetite 7. afib- on coumadin- INR pending 8. Bipolar- has been seen by psych previous admission 9. noncomplaince- previous getting HD in [place], recent admit to [place]- verbal commitment with MD that he will be compliant with HD  Shelle Iron, NP [facility] [phone] 05/29/2015, 2:43 PM   Pt seen, examined and agree w A/P as above.  Kelly Splinter MD pager [phone]    cell [phone] 05/30/2015, 8:31 AM

## 2015-05-29 NOTE — ED Provider Notes (Signed)
Case discussed with Dr. Heber Alta Sierra of internal medicine teaching service who agrees to accept the patient.  Delora Fuel, MD 10/02/01 [id number]

## 2015-05-29 NOTE — Progress Notes (Signed)
ANTICOAGULATION CONSULT NOTE - Initial Consult  Pharmacy Consult for coumadin Indication: atrial fibrillation  Allergies  Allergen Reactions  . Penicillins Other (See Comments)    unsure    Patient Measurements:   Heparin Dosing Weight:   Vital Signs: Temp: 97.7 F (36.5 C) (09/05 1900) Temp Source: Oral (09/05 1900) BP: 161/90 mmHg (09/05 1900) Pulse Rate: 63 (09/05 1900)  Labs:  Recent Labs  05/29/15 1450  HGB 9.3*  HCT 28.6*  PLT 139*  LABPROT 21.5*  INR 1.88*  CREATININE 12.47*    Estimated Creatinine Clearance: 5.7 mL/min (by C-G formula based on Cr of 12.47).   Medical History: Past Medical History  Diagnosis Date  . Cancer   . Arthritis   . Renal disorder   . Hypertension   . Diabetes mellitus without complication   . Asthma   . COPD (chronic obstructive pulmonary disease)   . Coronary artery disease     Medications:  Scheduled:  . [START ON 05/30/2015] calcitRIOL  1 mcg Oral Q T,Th,Sa-HD  . [START ON 05/30/2015] ferric gluconate (FERRLECIT/NULECIT) IV  125 mg Intravenous Q T,Th,Sa-HD  . labetalol  200 mg Oral TID  . [START ON 05/30/2015] loratadine  10 mg Oral Daily  . multivitamin  1 tablet Oral QHS  . [START ON 05/30/2015] vancomycin  1,000 mg Intravenous To OR   Infusions:    Assessment: 64 yo male with hx of afib will be continued on coumadin after HD catheter is placed (probably tonight or tomorrow).  Patient was on coumadin 5 mg po daily prior to admission (last dose was 05/28/15).  INR is 1.88.   Goal of Therapy:  INR 2-3 Monitor platelets by anticoagulation protocol: Yes   Plan:  - f/u to see if patient will go to OR tonight or not.  If not, will not start coumadin until tomorrow.   Aliviyah Malanga, Tsz-Yin 05/29/2015,8:48 PM

## 2015-05-29 NOTE — ED Notes (Signed)
KVO started on IV pump

## 2015-05-30 ENCOUNTER — Inpatient Hospital Stay (HOSPITAL_COMMUNITY): Payer: Medicaid Other

## 2015-05-30 ENCOUNTER — Encounter (HOSPITAL_COMMUNITY): Payer: Self-pay | Admitting: Vascular Surgery

## 2015-05-30 DIAGNOSIS — I120 Hypertensive chronic kidney disease with stage 5 chronic kidney disease or end stage renal disease: Secondary | ICD-10-CM

## 2015-05-30 DIAGNOSIS — M7989 Other specified soft tissue disorders: Secondary | ICD-10-CM | POA: Diagnosis present

## 2015-05-30 DIAGNOSIS — N186 End stage renal disease: Secondary | ICD-10-CM

## 2015-05-30 DIAGNOSIS — E1122 Type 2 diabetes mellitus with diabetic chronic kidney disease: Secondary | ICD-10-CM

## 2015-05-30 DIAGNOSIS — Z9114 Patient's other noncompliance with medication regimen: Secondary | ICD-10-CM

## 2015-05-30 DIAGNOSIS — I4891 Unspecified atrial fibrillation: Secondary | ICD-10-CM

## 2015-05-30 DIAGNOSIS — Z992 Dependence on renal dialysis: Secondary | ICD-10-CM

## 2015-05-30 LAB — RETICULOCYTES
RBC.: 3.51 MIL/uL — AB (ref 4.22–5.81)
RETIC COUNT ABSOLUTE: 28.1 10*3/uL (ref 19.0–186.0)
RETIC CT PCT: 0.8 % (ref 0.4–3.1)

## 2015-05-30 LAB — CBC
HCT: 28.9 % — ABNORMAL LOW (ref 39.0–52.0)
HEMOGLOBIN: 9.1 g/dL — AB (ref 13.0–17.0)
MCH: 26.6 pg (ref 26.0–34.0)
MCHC: 31.5 g/dL (ref 30.0–36.0)
MCV: 84.5 fL (ref 78.0–100.0)
Platelets: 154 10*3/uL (ref 150–400)
RBC: 3.42 MIL/uL — AB (ref 4.22–5.81)
RDW: 16.2 % — ABNORMAL HIGH (ref 11.5–15.5)
WBC: 5.1 10*3/uL (ref 4.0–10.5)

## 2015-05-30 LAB — BASIC METABOLIC PANEL
ANION GAP: 17 — AB (ref 5–15)
BUN: 95 mg/dL — ABNORMAL HIGH (ref 6–20)
CALCIUM: 8.6 mg/dL — AB (ref 8.9–10.3)
CHLORIDE: 100 mmol/L — AB (ref 101–111)
CO2: 22 mmol/L (ref 22–32)
Creatinine, Ser: 13.13 mg/dL — ABNORMAL HIGH (ref 0.61–1.24)
GFR calc non Af Amer: 3 mL/min — ABNORMAL LOW (ref >60)
GFR, EST AFRICAN AMERICAN: 4 mL/min — AB (ref >60)
Glucose, Bld: 85 mg/dL (ref 65–99)
Potassium: 6 mmol/L — ABNORMAL HIGH (ref 3.5–5.1)
Sodium: 139 mmol/L (ref 135–145)

## 2015-05-30 LAB — HIV ANTIBODY (ROUTINE TESTING W REFLEX): HIV Screen 4th Generation wRfx: NONREACTIVE

## 2015-05-30 LAB — GLUCOSE, CAPILLARY
GLUCOSE-CAPILLARY: 95 mg/dL (ref 65–99)
GLUCOSE-CAPILLARY: 140 mg/dL — AB (ref 65–99)
Glucose-Capillary: 92 mg/dL (ref 65–99)

## 2015-05-30 LAB — IRON AND TIBC
Iron: 29 ug/dL — ABNORMAL LOW (ref 45–182)
SATURATION RATIOS: 10 % — AB (ref 17.9–39.5)
TIBC: 290 ug/dL (ref 250–450)
UIBC: 261 ug/dL

## 2015-05-30 LAB — FERRITIN: FERRITIN: 406 ng/mL — AB (ref 24–336)

## 2015-05-30 LAB — TECHNOLOGIST SMEAR REVIEW

## 2015-05-30 LAB — MAGNESIUM: Magnesium: 2.2 mg/dL (ref 1.7–2.4)

## 2015-05-30 MED ORDER — HEPARIN SODIUM (PORCINE) 1000 UNIT/ML IJ SOLN
INTRAMUSCULAR | Status: DC | PRN
  Administered 2015-05-30: 1000 [IU] via INTRAVENOUS

## 2015-05-30 MED ORDER — LIDOCAINE HCL (PF) 1 % IJ SOLN: 5.0000 mL | INTRAMUSCULAR | Status: DC | PRN

## 2015-05-30 MED ORDER — FENTANYL CITRATE (PF) 100 MCG/2ML IJ SOLN: 25.0000 ug | INTRAMUSCULAR | Status: DC | PRN

## 2015-05-30 MED ORDER — OXYCODONE-ACETAMINOPHEN 5-325 MG PO TABS
1.0000 | ORAL_TABLET | ORAL | Status: DC | PRN
  Administered 2015-05-30 – 2015-06-01 (×4): 2 via ORAL
  Filled 2015-05-30 (×4): qty 2

## 2015-05-30 MED ORDER — HEPARIN SODIUM (PORCINE) 1000 UNIT/ML DIALYSIS: 1000.0000 [IU] | INTRAMUSCULAR | Status: DC | PRN

## 2015-05-30 MED ORDER — PROMETHAZINE HCL 25 MG/ML IJ SOLN
6.2500 mg | INTRAMUSCULAR | Status: DC | PRN
Start: 2015-05-30 — End: 2015-05-30

## 2015-05-30 MED ORDER — ASPIRIN 81 MG PO CHEW
81.0000 mg | CHEWABLE_TABLET | Freq: Every day | ORAL | Status: DC
  Administered 2015-05-30 – 2015-06-02 (×4): 81 mg via ORAL
  Filled 2015-05-30 (×5): qty 1

## 2015-05-30 MED ORDER — PENTAFLUOROPROP-TETRAFLUOROETH EX AERO: 1.0000 "application " | INHALATION_SPRAY | CUTANEOUS | Status: DC | PRN

## 2015-05-30 MED ORDER — PHENYLEPHRINE HCL 10 MG/ML IJ SOLN
INTRAMUSCULAR | Status: DC | PRN
  Administered 2015-05-29 – 2015-05-30 (×4): 80 ug via INTRAVENOUS

## 2015-05-30 MED ORDER — IOHEXOL 300 MG/ML  SOLN
INTRAMUSCULAR | Status: DC | PRN
  Administered 2015-05-30: 35 mL via INTRAVENOUS

## 2015-05-30 MED ORDER — HEPARIN SODIUM (PORCINE) 1000 UNIT/ML DIALYSIS: 4700.0000 [IU] | Freq: Once | INTRAMUSCULAR | Status: DC

## 2015-05-30 MED ORDER — ONDANSETRON HCL 4 MG/2ML IJ SOLN
INTRAMUSCULAR | Status: DC | PRN
  Administered 2015-05-30: 4 mg via INTRAVENOUS

## 2015-05-30 MED ORDER — LIDOCAINE-PRILOCAINE 2.5-2.5 % EX CREA
1.0000 "application " | TOPICAL_CREAM | CUTANEOUS | Status: DC | PRN
  Filled 2015-05-30: qty 5

## 2015-05-30 MED ORDER — 0.9 % SODIUM CHLORIDE (POUR BTL) OPTIME
TOPICAL | Status: DC | PRN
  Administered 2015-05-30: 1000 mL

## 2015-05-30 MED ORDER — SODIUM CHLORIDE 0.9 % IR SOLN
Status: DC | PRN
  Administered 2015-05-30: 200 mL

## 2015-05-30 MED ORDER — ALTEPLASE 2 MG IJ SOLR
2.0000 mg | Freq: Once | INTRAMUSCULAR | Status: DC | PRN
  Filled 2015-05-30: qty 2

## 2015-05-30 MED ORDER — SODIUM CHLORIDE 0.9 % IV SOLN: 100.0000 mL | INTRAVENOUS | Status: DC | PRN

## 2015-05-30 MED ORDER — CEFAZOLIN SODIUM-DEXTROSE 2-3 GM-% IV SOLR
INTRAVENOUS | Status: DC | PRN
  Administered 2015-05-29: 2 g via INTRAVENOUS

## 2015-05-30 MED ORDER — HEPARIN SODIUM (PORCINE) 5000 UNIT/ML IJ SOLN
5000.0000 [IU] | Freq: Three times a day (TID) | INTRAMUSCULAR | Status: DC
  Administered 2015-05-30 – 2015-05-31 (×3): 5000 [IU] via SUBCUTANEOUS
  Filled 2015-05-30 (×8): qty 1

## 2015-05-30 NOTE — Anesthesia Postprocedure Evaluation (Signed)
  Anesthesia Post-op Note  Patient: Noah Lewis  Procedure(s) Performed: Procedure(s): INSERTION OF DIALYSIS CATHETER (N/A)  Patient Location: PACU  Anesthesia Type:General  Level of Consciousness: awake and alert   Airway and Oxygen Therapy: Patient Spontanous Breathing  Post-op Pain: none  Post-op Assessment: Post-op Vital signs reviewed              Post-op Vital Signs: Reviewed  Last Vitals:  Filed Vitals:   05/30/15 0605  BP: 128/80  Pulse: 88  Temp: 36.4 C  Resp: 20    Complications: No apparent anesthesia complications

## 2015-05-30 NOTE — Progress Notes (Signed)
  [facility] Progress Note   Subjective: on HD, no complaints  Filed Vitals:   05/30/15 1100 05/30/15 1117 05/30/15 1147 05/30/15 1206  BP: 132/57 95/55 100/62 108/77  Pulse: 57 97 95 89  Temp:      TempSrc:      Resp:   16   Weight:      SpO2:   99%    Exam: WDWN, not in distress No jvd Chest occ rhonchi, no wheezing RRR no MRG noted Abd obese, soft ntnd +bs no mass or ascites Ext no LE edema L arm AVF+bruit Neuro does not respond to all questions, ? HOH, uncooperative at times  [facility]  4h 68min  79.5kg   2/2.25 bath  L arm AVG   Heparin 4700 Ven - 100 x 10 start 9/6 Calc - 1 ug tiw      Assessment: 1. L arm swelling , unable to use AVF due to swelling- seen by VVS, don't use this AVF until swelling better, may need f-gram in future 2. Hx SVC syndrome 3. New TDC in groin placed 9/5 4. COPD on duoneb at home 5. HTN cont labetalol 6. AFib on coumadin  Plan - HD today    Kelly Splinter MD  pager [phone]    cell [phone]  05/30/2015, 12:37 PM     Recent Labs Lab 05/29/15 1450 05/30/15 0516  NA 137 139  K 5.4* 6.0*  CL 98* 100*  CO2 23 22  GLUCOSE 102* 85  BUN 97* 95*  CREATININE 12.47* 13.13*  CALCIUM 8.6* 8.6*   No results for input(s): AST, ALT, ALKPHOS, BILITOT, PROT, ALBUMIN in the last 168 hours.  Recent Labs Lab 05/29/15 1450 05/30/15 0516  WBC 5.8 5.1  NEUTROABS 4.0  --   HGB 9.3* 9.1*  HCT 28.6* 28.9*  MCV 84.9 84.5  PLT 139* 154   . aspirin  81 mg Oral Daily  . calcitRIOL  1 mcg Oral Q T,Th,Sa-HD  . ferric gluconate (FERRLECIT/NULECIT) IV  125 mg Intravenous Q T,Th,Sa-HD  . heparin subcutaneous  5,000 Units Subcutaneous 3 times per day  . labetalol  200 mg Oral TID  . loratadine  10 mg Oral Daily  . multivitamin  1 tablet Oral QHS  . vancomycin  1,000 mg Intravenous To OR     acetaminophen, capsaicin, ipratropium-albuterol, oxyCODONE-acetaminophen, polyethylene glycol

## 2015-05-30 NOTE — Progress Notes (Signed)
Subjective: Patient with no complaints this morning. Reports that he does not wish to stay in the hospital and needs to be out of here by tomorrow to pick his kids up from school. Reports [employer] took his kids away and if he does not show up they will no longer let him see his kids.   Objective: Vital signs in last 24 hours: Filed Vitals:   05/30/15 0045 05/30/15 0100 05/30/15 0113 05/30/15 0605  BP: 117/87 151/94 150/84 128/80  Pulse: 98 94 79 88  Temp:  98.2 F (36.8 C) 97.7 F (36.5 C) 97.6 F (36.4 C)  TempSrc:   Oral Oral  Resp: 18 11 20 20   SpO2: 97% 97% 96% 100%   Weight change:   Intake/Output Summary (Last 24 hours) at 05/30/15 0717 Last data filed at 05/30/15 0100  Gross per 24 hour  Intake    175 ml  Output      0 ml  Net    175 ml   Physical Exam  Constitutional: He is oriented to person, place, and time and well-developed, well-nourished, and in no distress.  HENT:  Head: Normocephalic and atraumatic.  Eyes: EOM are normal.  Neck: Normal range of motion. No tracheal deviation present.  Cardiovascular: Normal rate, regular rhythm, normal heart sounds and intact distal pulses.  Pulmonary/Chest: Effort normal. No respiratory distress. He has no wheezes.  Abdominal: Soft. He exhibits no distension. There is no tenderness. There is no rebound and no guarding.  Musculoskeletal:  LUE with prominent nonpitting edema from left pectoralis to fingertips. Shoulder and upper arm extremely sensitive to palpation. Poorly demarcated erythema and warmth of left shoulder and axilla. Bruit present over AVF. No left radial pulse palpable.  Neurological: He is alert and oriented to person, place, and time.  Skin: Skin is warm and dry.   Lab Results: Basic Metabolic Panel:  Recent Labs Lab 05/23/15 0920 05/29/15 1450 05/30/15 0516  NA 134* 137 139  K 4.6 5.4* 6.0*  CL 96* 98* 100*  CO2 25 23 22   GLUCOSE 159* 102* 85  BUN 73* 97* 95*  CREATININE 9.62* 12.47* 13.13*   CALCIUM 8.5* 8.6* 8.6*  PHOS 6.0*  --   --    Liver Function Tests:  Recent Labs Lab 05/23/15 0920  ALBUMIN 3.1*   CBC:  Recent Labs Lab 05/23/15 0921 05/29/15 1450 05/30/15 0516  WBC 4.5 5.8 5.1  NEUTROABS 2.8 4.0  --   HGB 10.7* 9.3* 9.1*  HCT 32.6* 28.6* 28.9*  MCV 84.5 84.9 84.5  PLT 146* 139* 154   CBG:  Recent Labs Lab 05/23/15 0729 05/23/15 1510 05/29/15 1907 05/29/15 2303  GLUCAP 68 109* 85 95   Coagulation:  Recent Labs Lab 05/29/15 1450  LABPROT 21.5*  INR 1.88*   Urine Drug Screen: Drugs of Abuse     Component Value Date/Time   LABOPIA NONE DETECTED 10/26/2007 1400   COCAINSCRNUR NONE DETECTED 10/26/2007 1400   LABBENZ NONE DETECTED 10/26/2007 1400   AMPHETMU NONE DETECTED 10/26/2007 1400   THCU NONE DETECTED 10/26/2007 1400   LABBARB  10/26/2007 1400    NONE DETECTED        DRUG SCREEN FOR MEDICAL PURPOSES ONLY.  IF CONFIRMATION IS NEEDED FOR ANY PURPOSE, NOTIFY LAB WITHIN 5 DAYS.    Micro Results: No results found for this or any previous visit (from the past 240 hour(s)). Studies/Results: Dg Chest Port 1 View  05/30/2015   CLINICAL DATA:  64 year old male status post  dialysis catheter insertion. Move  EXAM: PORTABLE CHEST - 1 VIEW  COMPARISON:  Radiograph dated 04/1915  FINDINGS: Single-view of the chest demonstrate an area of increased density in the left lower lung field which could be atelectatic changes. Pneumonia is not excluded. There is no pneumothorax. No significant pleural effusion. Stable cardiomegaly. Left subclavian or innominate vascular stent noted.  IMPRESSION: Left lower lung field atelectasis versus pneumonia. Clinical correlation and follow-up recommended.   Electronically Signed   By: Anner Crete M.D.   On: 05/30/2015 01:31   Dg C-arm 1-60 Min-no Report  05/30/2015   CLINICAL DATA: surgery   C-ARM 1-60 MINUTES  Fluoroscopy was utilized by the requesting physician.  No radiographic  interpretation.     Medications: I have reviewed the patient's current medications. Scheduled Meds: . calcitRIOL  1 mcg Oral Q T,Th,Sa-HD  . ferric gluconate (FERRLECIT/NULECIT) IV  125 mg Intravenous Q T,Th,Sa-HD  . labetalol  200 mg Oral TID  . loratadine  10 mg Oral Daily  . multivitamin  1 tablet Oral QHS  . vancomycin  1,000 mg Intravenous To OR   Continuous Infusions:  PRN Meds:.acetaminophen, capsaicin, ipratropium-albuterol, oxyCODONE-acetaminophen, polyethylene glycol Assessment/Plan: Active Problems:   End-stage renal disease needing dialysis   ESRD needing dialysis  LUE Swelling: Etiology unclear. Doppler negative for DVT. Thrill present overlying AVF, therefore, fistula does not seem to be occluded. However, prominent edema of full LUE and left chest would suggest venous/lymph drainage occlusion. Erythema is also concerning finding, though patient is afebrile and no leukocytosis. Therefore, erythema felt to be more due to swelling than cellulitis at this time. Given h/o SVC syndrome and smoking history, there is concern for underlying lung cancer. Patient had a CT chest in 2009, noting 2 small pulmonary nodules (RUL and LLL), with recommendation of 6 month repeat CT chest followup. No repeat CT chest is in the system. Vascular placed a fem cath last night. Receiving dialysis this morning.  - Follow up with Nephrology and Vascular to decide on permanent access. Concern with fem cath as patient reporting he wants to leave and may try to leave AMA. - Will hold off on CT chest with/without contrast to evaluate pulmonary nodules and LUE vasculature at this time. More pressing issues is getting good vascular access for dialysis.  - Appreciate Vascular and Nephro consults  ESRD (TTS): Last session Saturday, though session was short (approx 30 minutes per patient). Received fem cath last night and receiving HD this morning. Patient has h/o noncompliance with HD, though that does not seem to be the  issue at this time.  - Appreciate Nephrology consult  Atrial Fibrillation: Patient discharged 8/31 on warfarin, but patient denies taking it after discharge. INR 1.88 on admission. Will stop warfarin in the setting of non-compliance and RRR on exam today. Start ASA 81 mg.  - Continue Labetolol to 200 mg TID - D/C warfarin  - Start ASA 81 mg daily.   Possible Obstructive Sleep Apnea: Will need sleep study as an outpatient in order to obtain CPAP. - intermittently using CPAP  Type 2 Diabetes: No medications. Recent A1c of 5.7. CBGs stable during last admission, and only received 1 unit insulin. - CBGs with meals  HTN: stable.  - continue Labetolol 200 mg TID  FEN/GI - Renal diet  DVT Ppx: Heparin 5000 Units SQ q8hr   Dispo: Disposition is deferred at this time, awaiting improvement of current medical problems.  Anticipated discharge in approximately 1-3 day(s).   The patient  does not have a current PCP (Provider Not In System) and does need an [facility] hospital follow-up appointment after discharge.  The patient does have transportation limitations that hinder transportation to clinic appointments.  .Services Needed at time of discharge: Y = Yes, Blank = No PT:   OT:   RN:   Equipment:   Other:     LOS: 1 day   Maryellen Pile, MD 05/30/2015, 7:17 AM   Maryellen Pile PGY1 IM Resident [phone]

## 2015-05-30 NOTE — Progress Notes (Signed)
Notified HD nurse, Renee, that patient has returned from placement of R femoral tunneled HD catheter. Dorthey Sawyer, RN

## 2015-05-30 NOTE — Progress Notes (Signed)
Pt is now agreeable to HD this am. Hemodialysis RN reports that she can not take him at this time because she had to pick up another case once he refused. Pt has been made aware that he will have to wait at his time. Dorthey Sawyer, RN

## 2015-05-30 NOTE — Evaluation (Signed)
Occupational Therapy Evaluation Patient Details Name: Noah Lewis MRN: [id number] DOB: Sep 01, 1951 Today's Date: 05/30/2015    History of Present Illness 64 yo male admitted with L arm swelling. From SNF [facility]" PMH: ESRD, SVC syndrome, DMII HTN COPD bipolar depression,    Clinical Impression   PT admitted with L arm swelling.. Pt currently with functional limitiations due to the deficits listed below (see OT problem list). Pt reports [address] with his mother but chart and wrist band indicate previous location to be [facility] SNF. Pt denies SNF as previous location. Pt will benefit from skilled OT to increase their independence and safety with adls and balance to allow discharge SNF. OT to defer further treatment back to [facility] at this time     Follow Up Recommendations  SNF    Equipment Recommendations       Recommendations for Other Services       Precautions / Restrictions Precautions Precautions: Fall      Mobility Bed Mobility Overal bed mobility: Needs Assistance Bed Mobility: Sit to Supine       Sit to supine: Min assist   General bed mobility comments: (A) for safety and sequence  Transfers Overall transfer level: Needs assistance   Transfers: Sit to/from Stand Sit to Stand: Min assist         General transfer comment: (A) for safety     Balance   Sitting-balance support: No upper extremity supported;Feet supported Sitting balance-Leahy Scale: Good                                      ADL Overall ADL's : Needs assistance/impaired Eating/Feeding: Modified independent   Grooming: Wash/dry face;Supervision/safety;Sitting               Lower Body Dressing: Set up;Bed level Lower Body Dressing Details (indicate cue type and reason): able to sit EOB and cross bil LE to don socks Toilet Transfer: Minimal assistance;Stand-pivot Toilet Transfer Details (indicate cue type and reason): pt sit<>STand to  chair and back to simulate 3n1. pt reaching for environemental supports           General ADL Comments: pt somewhat uncooperative at times. pt states "what ? I can't hear you" Pt needs visual and auditory cues to participate. pt motivated by (A) to return to supine with heat increased in the room due to patient feeling cold     Vision Additional Comments: noted to have R eye pink membrane tissue present in the corner of the eye in the R lower quadrant.   Perception     Praxis      Pertinent Vitals/Pain Pain Assessment: Faces Faces Pain Scale: Hurts little more Pain Location: answer "yeah" but gives no details and doesnt answer further questions     Hand Dominance Right   Extremity/Trunk Assessment Upper Extremity Assessment Upper Extremity Assessment: Overall WFL for tasks assessed   Lower Extremity Assessment Lower Extremity Assessment: Defer to PT evaluation   Cervical / Trunk Assessment Cervical / Trunk Assessment: Kyphotic   Communication Communication Communication: HOH   Cognition Arousal/Alertness: Awake/alert Behavior During Therapy: Flat affect Overall Cognitive Status: No family/caregiver present to determine baseline cognitive functioning                     General Comments       Exercises       Shoulder Instructions  Home Living Family/patient expects to be discharged to:: Skilled nursing facility                                        Prior Functioning/Environment Level of Independence: Needs assistance             OT Diagnosis: Generalized weakness   OT Problem List:     OT Treatment/Interventions:      OT Goals(Current goals can be found in the care plan section) Acute Rehab OT Goals Patient Stated Goal: did not state.  OT Frequency:     Barriers to D/C:            Co-evaluation              End of Session Nurse Communication: Mobility status;Precautions  Activity Tolerance: Patient  limited by fatigue ("I am sleepy") Patient left: in bed;with call bell/phone within reach;with bed alarm set   Time: 1327-1340 OT Time Calculation (min): 13 min Charges:  OT General Charges $OT Visit: 1 Procedure OT Evaluation $Initial OT Evaluation Tier I: 1 Procedure G-Codes:    Peri Maris 2015-06-12, 1:57 PM   Jeri Modena   OTR/L Pager: [phone] Office: [phone] .

## 2015-05-30 NOTE — Progress Notes (Signed)
OT Cancellation Note  Patient Details Name: Noah Lewis MRN: [id number] DOB: [birth year]   Cancelled Treatment:    Reason Eval/Treat Not Completed: Patient at procedure or test/ unavailable (HD currently) Ot to continue to follow and check back as appropriate.  Parke Poisson B 05/30/2015, 8:36 AM   Noah Lewis   OTR/L Pager: [phone] Office: [phone] .

## 2015-05-30 NOTE — Op Note (Signed)
    NAME: Noah Lewis   MRN: [id number] DOB: February 25, 1951    DATE OF OPERATION: 05/30/2015  PREOP DIAGNOSIS: Stage V chronic kidney disease  POSTOP DIAGNOSIS: Same  PROCEDURE: Ultrasound-guided placement of right femoral 50 cm tunneled dialysis catheter  SURGEON: Judeth Cornfield. Scot Dock, MD, FACS  ASSIST: none  ANESTHESIA: Gen.   EBL: minimal  INDICATIONS: [name] is a 64 y.o. male who has a massively so little left upper extremity and his fistula is not usable. He was in need of a catheter. He tells me that he has central venous occlusions and therefore is not a catheter in the neck. His previous work has been done in [employer]. Therefore I elected to place a femoral catheter.  FINDINGS: patent right femoral vein  TECHNIQUE: The patient was taken to the operating room and received a general anesthetic. Both groins were prepped and draped in usual sterile fashion. Under ultrasound guidance, the right common femoral vein was cannulated and a guidewire introduced into the inferior vena cava under fluoroscopic control. The track was dilated. The dilator and peel-away sheath were advanced over the wire. The wire was advanced into the right atrium. The 50 cm catheter was passed over the wire into the peel-away sheath into the right atrium. The peel-away sheath and wire were removed. The exit site of the cath was selected and the cath was brought through the tunnel cut the appropriate length and the distal ports were attached. Both ports withdrew easily with flushed with heparinized saline and filled with concentrated heparin. The catheter tip was positioned in the right atrium. The catheter was secured at its exit site with a 3-0 nylon suture. The femoral cannulation site was closed with a 4-0 subcuticular stitch. Sterile dressing was applied. The patient tolerated the procedure well and was transferred to the recovery room in stable condition. All needle and sponge counts were  correct.  Deitra Mayo, MD, FACS [facility]  DATE OF DICTATION:   05/30/2015

## 2015-05-30 NOTE — Progress Notes (Signed)
Pt is refusing to go to hemodialysis. He states he is not going until he "eats some food". Pt has been offered [place] sandwich, fruit cup, cereal, and/or graham crackers but reports "that is not food" and he "must have some real food before he goes on the machine or it'll make him sick". Pt educated that breakfast will not be served for several hours. Pt still refuses to go with Hemo RN. Dorthey Sawyer, RN

## 2015-05-30 NOTE — Transfer of Care (Signed)
Immediate Anesthesia Transfer of Care Note  Patient: Noah Lewis  Procedure(s) Performed: Procedure(s): INSERTION OF DIALYSIS CATHETER (N/A)  Patient Location: PACU  Anesthesia Type:General  Level of Consciousness: sedated and responds to stimulation  Airway & Oxygen Therapy: Patient Spontanous Breathing  Post-op Assessment: Report given to RN, Post -op Vital signs reviewed and stable and Patient moving all extremities  Post vital signs: Reviewed and stable  Last Vitals:  Filed Vitals:   05/29/15 1900  BP: 161/90  Pulse: 63  Temp: 36.5 C  Resp: 20    Complications: No apparent anesthesia complications

## 2015-05-30 NOTE — Anesthesia Procedure Notes (Signed)
Procedure Name: LMA Insertion Date/Time: 05/29/2015 11:46 PM Performed by: Greggory Stallion, Corra Kaine L Pre-anesthesia Checklist: Patient identified, Emergency Drugs available, Suction available, Patient being monitored and Timeout performed Patient Re-evaluated:Patient Re-evaluated prior to inductionOxygen Delivery Method: Circle system utilized Preoxygenation: Pre-oxygenation with 100% oxygen Intubation Type: IV induction Ventilation: Mask ventilation without difficulty LMA: LMA inserted LMA Size: 4.0 Number of attempts: 1 Placement Confirmation: positive ETCO2 and breath sounds checked- equal and bilateral Tube secured with: Tape Dental Injury: Teeth and Oropharynx as per pre-operative assessment

## 2015-05-30 NOTE — Evaluation (Signed)
Physical Therapy Evaluation Patient Details Name: Noah Lewis MRN: [id number] DOB: [birth year] Today's Date: 05/30/2015   History of Present Illness  64 yo male admitted with L arm swelling. [facility] PMH: ESRD, SVC syndrome, DMII HTN COPD bipolar depression,   Clinical Impression   Pt admitted with above diagnosis. Pt currently with functional limitations due to the deficits listed below (see PT Problem List).  Pt will benefit from skilled PT to increase their independence and safety with mobility to allow discharge to the venue listed below.       Follow Up Recommendations SNF    Equipment Recommendations  Cane    Recommendations for Other Services       Precautions / Restrictions Precautions Precautions: Fall Precaution Comments: uses cane limited by low back pain      Mobility  Bed Mobility Overal bed mobility: Modified Independent Bed Mobility: Sit to Supine       Sit to supine: Min assist   General bed mobility comments: (A) for safety and sequence  Transfers Overall transfer level: Needs assistance Equipment used: None Transfers: Sit to/from Stand Sit to Stand: Min guard         General transfer comment: Close guard for safety, did not need phsyical assist  Ambulation/Gait Ambulation/Gait assistance: Min guard (without physical contact) Ambulation Distance (Feet): 150 Feet Assistive device: None (occasional use of hallway rail) Gait Pattern/deviations: Step-through pattern;Antalgic Gait velocity: unmeasured, likely decr on average   General Gait Details: slow and slightly unsteady but no loss of balance  Stairs            Wheelchair Mobility    Modified Rankin (Stroke Patients Only)       Balance Overall balance assessment: Needs assistance Sitting-balance support: No upper extremity supported;Feet supported Sitting balance-Leahy Scale: Good       Standing balance-Leahy Scale: Fair                                Pertinent Vitals/Pain Pain Assessment: Faces Faces Pain Scale: Hurts little more Pain Location: Back pain with walking; took standing rest break due to back pain Pain Descriptors / Indicators: Grimacing    Home Living Family/patient expects to be discharged to:: Other (Comment) (It seems he was admitted from SNF) Living Arrangements: Alone Available Help at Discharge: Family;Available PRN/intermittently Type of Home: House Home Access: Stairs to enter   Entrance Stairs-Number of Steps: 3 Home Layout: One level   Additional Comments: Pt may be experiencing homelessness, which complicates things; during walk today, he did describe his home, and stated his mother lives close    Prior Function Level of Independence: Needs assistance         Comments: uses cane at baseline, refuses RW when offered     Hand Dominance   Dominant Hand: Right    Extremity/Trunk Assessment   Upper Extremity Assessment: Overall WFL for tasks assessed (Noted swelling LUE)           Lower Extremity Assessment: Overall WFL for tasks assessed (for very basic functional activity )      Cervical / Trunk Assessment: Kyphotic  Communication   Communication: HOH  Cognition Arousal/Alertness: Awake/alert Behavior During Therapy: WFL for tasks assessed/performed Overall Cognitive Status: Within Functional Limits for tasks assessed                      General Comments General comments (skin integrity, edema,  etc.): Described accident as a boy that left him in chronic back pain and became emotional telling me about lost scholarships; Evasive when asked about home situation questions    Exercises        Assessment/Plan    PT Assessment Patient needs continued PT services  PT Diagnosis Difficulty walking   PT Problem List Decreased activity tolerance;Decreased balance;Decreased knowledge of use of DME;Pain;Decreased coordination;Decreased mobility  PT Treatment  Interventions DME instruction;Gait training;Balance training;Cognitive remediation;Patient/family education;Modalities   PT Goals (Current goals can be found in the Care Plan section) Acute Rehab PT Goals Patient Stated Goal: did not state. PT Goal Formulation:  (pt did not participate in goal setting) Time For Goal Achievement: 06/13/15 Potential to Achieve Goals: Fair Additional Goals Additional Goal #1: Pt will score greater than 49/56 on Berg Balance assessment to demonstrate decr risk of falls    Frequency Min 2X/week   Barriers to discharge Decreased caregiver support (unclear home environment or support.  Homeless?) It seems that pt was admitted from [facility], and it stands to reason that he can dc back there to complete his rehab course; If he doesn't go back to SNF, will need more information about his home situation/plans    Co-evaluation               End of Session Equipment Utilized During Treatment: Gait belt Activity Tolerance: Patient tolerated treatment well Patient left: in chair;with call bell/phone within reach Nurse Communication: Mobility status         Time: 1445-1515 PT Time Calculation (min) (ACUTE ONLY): 30 min   Charges:   PT Evaluation $Initial PT Evaluation Tier I: 1 Procedure PT Treatments $Gait Training: 8-22 mins   PT G CodesQuin Hoop 05/30/2015, 4:45 PM  Roney [place], [place]  Acute Rehabilitation Services Pager [phone] Office [phone]

## 2015-05-31 ENCOUNTER — Inpatient Hospital Stay (HOSPITAL_COMMUNITY): Payer: Medicaid Other

## 2015-05-31 ENCOUNTER — Encounter (HOSPITAL_COMMUNITY): Payer: Self-pay | Admitting: Radiology

## 2015-05-31 DIAGNOSIS — N189 Chronic kidney disease, unspecified: Secondary | ICD-10-CM

## 2015-05-31 DIAGNOSIS — D631 Anemia in chronic kidney disease: Secondary | ICD-10-CM | POA: Diagnosis present

## 2015-05-31 LAB — GLUCOSE, CAPILLARY
GLUCOSE-CAPILLARY: 93 mg/dL (ref 65–99)
GLUCOSE-CAPILLARY: 141 mg/dL — AB (ref 65–99)
GLUCOSE-CAPILLARY: 132 mg/dL — AB (ref 65–99)

## 2015-05-31 LAB — RENAL FUNCTION PANEL
ALBUMIN: 3.2 g/dL — AB (ref 3.5–5.0)
ANION GAP: 13 (ref 5–15)
BUN: 57 mg/dL — ABNORMAL HIGH (ref 6–20)
CALCIUM: 8.4 mg/dL — AB (ref 8.9–10.3)
CO2: 26 mmol/L (ref 22–32)
Chloride: 96 mmol/L — ABNORMAL LOW (ref 101–111)
Creatinine, Ser: 8.78 mg/dL — ABNORMAL HIGH (ref 0.61–1.24)
GFR calc non Af Amer: 6 mL/min — ABNORMAL LOW (ref >60)
GFR, EST AFRICAN AMERICAN: 7 mL/min — AB (ref >60)
GLUCOSE: 91 mg/dL (ref 65–99)
PHOSPHORUS: 7.2 mg/dL — AB (ref 2.5–4.6)
POTASSIUM: 4.6 mmol/L (ref 3.5–5.1)
SODIUM: 135 mmol/L (ref 135–145)

## 2015-05-31 LAB — CBC
HCT: 28 % — ABNORMAL LOW (ref 39.0–52.0)
HEMOGLOBIN: 8.8 g/dL — AB (ref 13.0–17.0)
MCH: 26.8 pg (ref 26.0–34.0)
MCHC: 31.4 g/dL (ref 30.0–36.0)
MCV: 85.4 fL (ref 78.0–100.0)
PLATELETS: 124 10*3/uL — AB (ref 150–400)
RBC: 3.28 MIL/uL — AB (ref 4.22–5.81)
RDW: 16.2 % — ABNORMAL HIGH (ref 11.5–15.5)
WBC: 5.4 10*3/uL (ref 4.0–10.5)

## 2015-05-31 MED ORDER — IOHEXOL 300 MG/ML  SOLN
80.0000 mL | Freq: Once | INTRAMUSCULAR | Status: AC | PRN
  Administered 2015-05-31: 100 mL via INTRAVENOUS

## 2015-05-31 MED ORDER — CALCIUM ACETATE (PHOS BINDER) 667 MG PO CAPS
2001.0000 mg | ORAL_CAPSULE | Freq: Three times a day (TID) | ORAL | Status: DC
  Administered 2015-05-31 – 2015-06-02 (×4): 2001 mg via ORAL
  Filled 2015-05-31 (×6): qty 3

## 2015-05-31 NOTE — Progress Notes (Signed)
Physical Therapy Treatment Patient Details Name: Noah Lewis MRN: [id number] DOB: Feb 05, 1951 Today's Date: 05/31/2015    History of Present Illness 64 yo male admitted with L arm swelling. [facility]" PMH: ESRD, SVC syndrome, DMII HTN COPD bipolar depression,     PT Comments    Session today focused on further assessment of balance and fall risk; Noah Lewis's score on teh Berg Balance Assessment is indicative of a high fall risk (32/56); He indicated to me that he was working with PT at [facility] on his balance and his back pain; SNF remains a workable dc plan for here in the short-term; as far as long term, is a group home or ALF-type situation available to him? That kind of supervision would hopefully help with HD compliance.   Follow Up Recommendations  SNF     Equipment Recommendations  Cane    Recommendations for Other Services       Precautions / Restrictions Precautions Precautions: Fall Precaution Comments: uses cane limited by low back pain    Mobility  Bed Mobility Overal bed mobility: Modified Independent Bed Mobility: Sidelying to Sit   Sidelying to sit: Modified independent (Device/Increase time)       General bed mobility comments: Employed log roll technique  Transfers Overall transfer level: Needs assistance Equipment used: None Transfers: Sit to/from Stand Sit to Stand: Supervision         General transfer comment: noted decr control with stand to sit  Ambulation/Gait             General Gait Details: Session focused on balance today; held off on amb   Stairs            Wheelchair Mobility    Modified Rankin (Stroke Patients Only)       Balance                                    Cognition Arousal/Alertness: Awake/alert Behavior During Therapy: WFL for tasks assessed/performed Overall Cognitive Status: Within Functional Limits for tasks assessed (for simple mobility tasks)                      Exercises      General Comments General comments (skin integrity, edema, etc.): Berg Balance Assessment Score indicates significant fall risk      Pertinent Vitals/Pain Pain Assessment: Faces Faces Pain Scale: Hurts whole lot Pain Location: Back pain with incr activity Pain Descriptors / Indicators: Aching;Grimacing;Guarding Pain Intervention(s): Limited activity within patient's tolerance;Patient requesting pain meds-RN notified    Home Living                      Prior Function            PT Goals (current goals can now be found in the care plan section) Acute Rehab PT Goals Patient Stated Goal: did not state. Progress towards PT goals: Progressing toward goals    Frequency  Min 2X/week    PT Plan Current plan remains appropriate    Co-evaluation             End of Session Equipment Utilized During Treatment: Other (comment) (refused gait belt) Activity Tolerance: Patient limited by pain Patient left: in bed;with call bell/phone within reach     Time: 1531-1552 PT Time Calculation (min) (ACUTE ONLY): 21 min  Charges:  $Therapeutic Activity: 8-22 mins  G Codes:      Noah Lewis [facility] 05/31/2015, 4:37 PM  Noah Lewis, [place] Pager [phone] Office [phone]

## 2015-05-31 NOTE — Progress Notes (Signed)
Patient refused blood sugar check this evening. Educated on importance, but patient stated he only checks his blood sugar twice in a day.  Will continue to monitor.

## 2015-05-31 NOTE — Progress Notes (Signed)
Patient ID: Maliki Gignac, male   DOB: [birth year], 64 y.o.   MRN: [id number] Medicine attending: I was able to do a more complete exam of this patient this morning together with resident physician Dr. Maryellen Pile. There is extensive left upper extremity edema involving the left chest wall as well. Superficial venous distention across the left pectoral muscle. He feels firm in the left axilla without a definite lymph node mass. We reviewed his contrast CT scan with the radiologist. This has clarified a number of issues. There is a vascular stent in the left subclavian vein which may be thrombosed. 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 There is extensive collateral venous flow including dilation of the right internal mammary veins in an  attempt to get blood back to the heart. The superior vena cava is patent. Interventional radiology consultation is pending to assess whether or not left upper extremity venogram will be helpful in further defining the venous anatomy on that side and whether or not he would be a candidate for a another stent. For the time being, it does not appear that we are going to be able to use his left arm AV fistula for dialysis or use the right side for access. Potassium now normal following dialysis via right femoral dialysis catheter.

## 2015-05-31 NOTE — Progress Notes (Signed)
  [facility] Progress Note   Subjective: on HD, no complaints  Filed Vitals:   05/30/15 1732 05/30/15 2300 05/31/15 0517 05/31/15 1019  BP: 94/68 113/80 136/87 129/73  Pulse: 71 95 82 85  Temp: 98.4 F (36.9 C) 98 F (36.7 C) 97.8 F (36.6 C) 98 F (36.7 C)  TempSrc: Oral Oral Oral Oral  Resp: 19 20 20 20   Weight:      SpO2: 97% 96% 94% 96%   Exam: WDWN, not in distress No jvd Chest occ rhonchi, no wheezing RRR no MRG noted Abd obese, soft ntnd +bs no mass or ascites Ext no LE edema LUE 3+ edema throughout L arm AVF+bruit Neuro is extremely HOH, nonfocal, groggy  [facility] [place]  4h 4min  79.5kg   2/2.25 bath  L arm AVG   Heparin 4700 Ven - 100 x 10 start 9/6 Calc - 1 ug tiw      Assessment: 1. L arm swelling - unable to use AVF due to swelling. Tunneled groin cath placed by [facility] for HD access. Per primary team possible IR consult for fistulogram.  2. Hx SVC syndrome 3. COPD on duoneb at home 4. HTN on labetalol 5. AFib on coumadin  Plan - HD tomorrow using cath    Kelly Splinter MD  pager [phone]    cell [phone]  05/31/2015, 12:15 PM     Recent Labs Lab 05/29/15 1450 05/30/15 0516 05/31/15 0437  NA 137 139 135  K 5.4* 6.0* 4.6  CL 98* 100* 96*  CO2 23 22 26   GLUCOSE 102* 85 91  BUN 97* 95* 57*  CREATININE 12.47* 13.13* 8.78*  CALCIUM 8.6* 8.6* 8.4*  PHOS  --   --  7.2*    Recent Labs Lab 05/31/15 0437  ALBUMIN 3.2*    Recent Labs Lab 05/29/15 1450 05/30/15 0516 05/31/15 0437  WBC 5.8 5.1 5.4  NEUTROABS 4.0  --   --   HGB 9.3* 9.1* 8.8*  HCT 28.6* 28.9* 28.0*  MCV 84.9 84.5 85.4  PLT 139* 154 124*   . aspirin  81 mg Oral Daily  . calcitRIOL  1 mcg Oral Q T,Th,Sa-HD  . calcium acetate  2,001 mg Oral TID WC  . ferric gluconate (FERRLECIT/NULECIT) IV  125 mg Intravenous Q T,Th,Sa-HD  . heparin subcutaneous  5,000 Units Subcutaneous 3 times per day  . labetalol  200 mg Oral TID  . loratadine  10 mg Oral Daily  .  multivitamin  1 tablet Oral QHS     acetaminophen, capsaicin, ipratropium-albuterol, oxyCODONE-acetaminophen, polyethylene glycol

## 2015-05-31 NOTE — Progress Notes (Signed)
Subjective: Patient very drowsy this morning. No complaints. Continues to have swelling of left arm and chest. Denies any other symptoms.   Objective: Vital signs in last 24 hours: Filed Vitals:   05/30/15 1732 05/30/15 2300 05/31/15 0517 05/31/15 1019  BP: 94/68 113/80 136/87 129/73  Pulse: 71 95 82 85  Temp: 98.4 F (36.9 C) 98 F (36.7 C) 97.8 F (36.6 C) 98 F (36.7 C)  TempSrc: Oral Oral Oral Oral  Resp: 19 20 20 20   Weight:      SpO2: 97% 96% 94% 96%   Weight change:   Intake/Output Summary (Last 24 hours) at 05/31/15 1114 Last data filed at 05/31/15 0900  Gross per 24 hour  Intake    840 ml  Output   1984 ml  Net  -1144 ml   Physical Exam  Constitutional: He is oriented to person, place, and time and well-developed, well-nourished, and in no distress.  HENT:  Head: Normocephalic and atraumatic.  Eyes: EOM are normal.  Neck: Normal range of motion. No tracheal deviation present.  Cardiovascular: Normal rate, regular rhythm, normal heart sounds and intact distal pulses.  Pulmonary/Chest: Effort normal. No respiratory distress. He has no wheezes.  Abdominal: Soft. He exhibits no distension. There is no tenderness. There is no rebound and no guarding.  Musculoskeletal:  LUE with prominent nonpitting edema from left pectoralis to fingertips. Shoulder and upper arm sensitive to palpation. Poorly demarcated erythema and warmth of left shoulder and axilla. No bruit present over AVF. No left radial pulse palpable. Firm in the left axilla without any definite lymph node mass palpated.  Neurological: He is alert and oriented to person, place, and time.  Skin: Skin is warm and dry.   Lab Results: Basic Metabolic Panel:  Recent Labs Lab 05/30/15 0516 05/30/15 0936 05/31/15 0437  NA 139  --  135  K 6.0*  --  4.6  CL 100*  --  96*  CO2 22  --  26  GLUCOSE 85  --  91  BUN 95*  --  57*  CREATININE 13.13*  --  8.78*  CALCIUM 8.6*  --  8.4*  MG  --  2.2   --   PHOS  --   --  7.2*   Liver Function Tests:  Recent Labs Lab 05/31/15 0437  ALBUMIN 3.2*   CBC:  Recent Labs Lab 05/29/15 1450 05/30/15 0516 05/31/15 0437  WBC 5.8 5.1 5.4  NEUTROABS 4.0  --   --   HGB 9.3* 9.1* 8.8*  HCT 28.6* 28.9* 28.0*  MCV 84.9 84.5 85.4  PLT 139* 154 124*   CBG:  Recent Labs Lab 05/29/15 1907 05/29/15 2303 05/30/15 0032 05/30/15 0802 05/31/15 0747  GLUCAP 85 95 92 140* 93   Coagulation:  Recent Labs Lab 05/29/15 1450  LABPROT 21.5*  INR 1.88*   Anemia Panel:  Recent Labs Lab 05/30/15 0936  FERRITIN 406*  TIBC 290  IRON 29*  RETICCTPCT 0.8   Urine Drug Screen: Drugs of Abuse     Component Value Date/Time   LABOPIA NONE DETECTED 10/26/2007 1400   COCAINSCRNUR NONE DETECTED 10/26/2007 1400   LABBENZ NONE DETECTED 10/26/2007 1400   AMPHETMU NONE DETECTED 10/26/2007 1400   THCU NONE DETECTED 10/26/2007 1400   LABBARB  10/26/2007 1400    NONE DETECTED        DRUG SCREEN FOR MEDICAL PURPOSES ONLY.  IF CONFIRMATION IS NEEDED FOR ANY PURPOSE, NOTIFY LAB WITHIN 5 DAYS.  Micro Results: No results found for this or any previous visit (from the past 240 hour(s)). Studies/Results: Dg Chest Port 1 View  05/30/2015   CLINICAL DATA:  64 year old male status post dialysis catheter insertion. Move  EXAM: PORTABLE CHEST - 1 VIEW  COMPARISON:  Radiograph dated 04/1915  FINDINGS: Single-view of the chest demonstrate an area of increased density in the left lower lung field which could be atelectatic changes. Pneumonia is not excluded. There is no pneumothorax. No significant pleural effusion. Stable cardiomegaly. Left subclavian or innominate vascular stent noted.  IMPRESSION: Left lower lung field atelectasis versus pneumonia. Clinical correlation and follow-up recommended.   Electronically Signed   By: Anner Crete M.D.   On: 05/30/2015 01:31   Dg C-arm 1-60 Min-no Report  05/30/2015   CLINICAL DATA: surgery   C-ARM 1-60 MINUTES   Fluoroscopy was utilized by the requesting physician.  No radiographic  interpretation.    Medications: I have reviewed the patient's current medications. Scheduled Meds: . aspirin  81 mg Oral Daily  . calcitRIOL  1 mcg Oral Q T,Th,Sa-HD  . ferric gluconate (FERRLECIT/NULECIT) IV  125 mg Intravenous Q T,Th,Sa-HD  . heparin subcutaneous  5,000 Units Subcutaneous 3 times per day  . labetalol  200 mg Oral TID  . loratadine  10 mg Oral Daily  . multivitamin  1 tablet Oral QHS   Continuous Infusions:  PRN Meds:.acetaminophen, capsaicin, ipratropium-albuterol, oxyCODONE-acetaminophen, polyethylene glycol Assessment/Plan: Active Problems:   End-stage renal disease needing dialysis   ESRD needing dialysis   Left arm swelling   Anemia of chronic kidney failure  LUE Swelling: Chest CT with contrast done yesterday. Full report pending but spoke reviewed with radiologist. Vascular stent in left subclavian vein which may be thrombosed as well as thrombosed right subclavian vein. Extensive collateral venous flow with dilation of right internal mammary veins on the right. Patent SVC. No concerns for SVC syndrome. Will be unable to use AVF at this time, using tunneled groin cath placed on 9/5. Spoke with nephrology and he does not need further HD today following contrast load for CT yesterday.  - Consult IR for possible fistulogram.  - K improved from 6 to 4.6 follow dialysis yesterday - Scheduled for dialysis tomorrow  ESRD (TTS): Last session Tuesday. Received fem cath 9/5.atient has h/o noncompliance with HD, though that does not seem to be the issue at this time.  - Appreciate Nephrology consult - Dialysis tomorrow  Atrial Fibrillation: Patient discharged 8/31 on warfarin, but patient denies taking it after discharge. INR 1.88 on admission. Will stop warfarin in the setting of non-compliance and RRR on exam today. Start ASA 81 mg.  - Continue Labetolol to 200 mg TID  - D/C warfarin  - Start  ASA 81 mg daily.   Possible Obstructive Sleep Apnea: Will need sleep study as an outpatient in order to obtain CPAP. - intermittently using CPAP  Type 2 Diabetes: No medications. Recent A1c of 5.7. CBGs between 92-141. Continue to montior.  - CBGs with meals  HTN: stable.  - continue Labetolol 200 mg TID  FEN/GI - Renal diet  Anemia of Chronic Disease: 2/2 CKD.   DVT Ppx: Heparin 5000 Units SQ q8hr   Dispo: Disposition is deferred at this time, awaiting improvement of current medical problems.  Anticipated discharge in approximately 1-3 day(s).   The patient does not have a current PCP (Provider Not In System) and does need an [facility] hospital follow-up appointment after discharge.  The patient does have  transportation limitations that hinder transportation to clinic appointments.  .Services Needed at time of discharge: Y = Yes, Blank = No PT:   OT:   RN:   Equipment:   Other:     LOS: 2 days   Maryellen Pile, MD 05/31/2015, 11:14 AM

## 2015-05-31 NOTE — Clinical Documentation Improvement (Signed)
Internal Medicine Nephrology/Renal  Can the diagnosis of anemia be further specified?   Anemia of chronic disease / CKD  Other  Clinically Undetermined  Please exercise your independent, professional judgment when responding. A specific answer is not anticipated or expected.   Thank you, Mateo Flow, RN [phone] Clinical Documentation Specialist

## 2015-06-01 DIAGNOSIS — D631 Anemia in chronic kidney disease: Secondary | ICD-10-CM

## 2015-06-01 LAB — GLUCOSE, CAPILLARY: Glucose-Capillary: 85 mg/dL (ref 65–99)

## 2015-06-01 LAB — MRSA PCR SCREENING: MRSA BY PCR: POSITIVE — AB

## 2015-06-01 MED ORDER — CALCITRIOL 0.5 MCG PO CAPS
ORAL_CAPSULE | ORAL | Status: AC
  Filled 2015-06-01: qty 2

## 2015-06-01 MED ORDER — LIDOCAINE-PRILOCAINE 2.5-2.5 % EX CREA: 1.0000 "application " | TOPICAL_CREAM | CUTANEOUS | Status: DC | PRN

## 2015-06-01 MED ORDER — DARBEPOETIN ALFA 25 MCG/0.42ML IJ SOSY
PREFILLED_SYRINGE | INTRAMUSCULAR | Status: AC
  Filled 2015-06-01: qty 0.42

## 2015-06-01 MED ORDER — ALTEPLASE 2 MG IJ SOLR
2.0000 mg | Freq: Once | INTRAMUSCULAR | Status: DC | PRN
  Filled 2015-06-01: qty 2

## 2015-06-01 MED ORDER — SODIUM CHLORIDE 0.9 % IV SOLN: 100.0000 mL | INTRAVENOUS | Status: DC | PRN

## 2015-06-01 MED ORDER — HEPARIN SODIUM (PORCINE) 1000 UNIT/ML DIALYSIS: 1000.0000 [IU] | INTRAMUSCULAR | Status: DC | PRN

## 2015-06-01 MED ORDER — CHLORHEXIDINE GLUCONATE CLOTH 2 % EX PADS: 6.0000 | MEDICATED_PAD | Freq: Every day | CUTANEOUS | Status: DC

## 2015-06-01 MED ORDER — DARBEPOETIN ALFA 25 MCG/0.42ML IJ SOSY
25.0000 ug | PREFILLED_SYRINGE | INTRAMUSCULAR | Status: DC
  Administered 2015-06-01: 25 ug via INTRAVENOUS
  Filled 2015-06-01: qty 0.42

## 2015-06-01 MED ORDER — HEPARIN SODIUM (PORCINE) 1000 UNIT/ML DIALYSIS: 2500.0000 [IU] | INTRAMUSCULAR | Status: DC | PRN

## 2015-06-01 MED ORDER — PENTAFLUOROPROP-TETRAFLUOROETH EX AERO: 1.0000 "application " | INHALATION_SPRAY | CUTANEOUS | Status: DC | PRN

## 2015-06-01 MED ORDER — LIDOCAINE HCL (PF) 1 % IJ SOLN: 5.0000 mL | INTRAMUSCULAR | Status: DC | PRN

## 2015-06-01 MED ORDER — MUPIROCIN 2 % EX OINT
1.0000 "application " | TOPICAL_OINTMENT | Freq: Two times a day (BID) | CUTANEOUS | Status: DC
  Administered 2015-06-01 – 2015-06-02 (×2): 1 via NASAL
  Filled 2015-06-01 (×2): qty 22

## 2015-06-01 NOTE — Progress Notes (Signed)
Patient ID: Noah Lewis, male   DOB: 1950/09/27, 64 y.o.   MRN: [id number] Medicine attending: I examined this patient this morning together with resident physician Dr. Maryellen Pile. He is clinically stable and being dialyzed via a temporary right femoral access catheter. We are waiting for interventional radiology consultation. We need to define the venous anatomy on the left to see if the left subclavian vascular stent is in fact thrombosed which we suspect from the contrast CT scan and whether or not we have an option to replace a stent. The patient needs to remain hospitalized until we define his venous anatomy  to make decisions on appropriate vascular access going into the future.

## 2015-06-01 NOTE — Progress Notes (Signed)
Subjective: Patient with no complaints this morning. Mild tenderness on LUE. Denies any other symptoms.  Objective: Vital signs in last 24 hours: Filed Vitals:   05/31/15 1019 05/31/15 1700 05/31/15 2117 06/01/15 0513  BP: 129/73 118/68 134/77 156/97  Pulse: 85 86 77 94  Temp: 98 F (36.7 C) 98.2 F (36.8 C) 97.9 F (36.6 C) 97.5 F (36.4 C)  TempSrc: Oral Oral Oral Oral  Resp: 20 18 20 22   Weight:   181 lb 1 oz (82.13 kg)   SpO2: 96% 96% 100% 100%   Weight change: -1 lb 14.7 oz (-0.87 kg)  Intake/Output Summary (Last 24 hours) at 06/01/15 0644 Last data filed at 06/01/15 0600  Gross per 24 hour  Intake   1080 ml  Output    140 ml  Net    940 ml   Physical Exam  Constitutional: He is oriented to person, place, and time and well-developed, well-nourished, and in no distress.  HENT:  Head: Normocephalic and atraumatic.  Eyes: EOM are normal.  Neck: Normal range of motion. No tracheal deviation present.  Cardiovascular: Normal rate, regular rhythm, normal heart sounds and intact distal pulses.  Pulmonary/Chest: Effort normal. No respiratory distress. He has no wheezes.  Abdominal: Soft. He exhibits no distension. There is no tenderness. There is no rebound and no guarding.  Musculoskeletal:  LUE with prominent nonpitting edema from left pectoralis to fingertips. Shoulder and upper arm sensitive to palpation. Poorly demarcated erythema and warmth of left shoulder and axilla. No bruit present over AVF. No left radial pulse palpable. Firm in the left axilla without any definite lymph node mass palpated.  Neurological: He is alert and oriented to person, place, and time.  Skin: Skin is warm and dry.   Lab Results: Basic Metabolic Panel:  Recent Labs Lab 05/30/15 0516 05/30/15 0936 05/31/15 0437  NA 139  --  135  K 6.0*  --  4.6  CL 100*  --  96*  CO2 22  --  26  GLUCOSE 85  --  91  BUN 95*  --  57*  CREATININE 13.13*  --  8.78*  CALCIUM 8.6*  --   8.4*  MG  --  2.2  --   PHOS  --   --  7.2*   Liver Function Tests:  Recent Labs Lab 05/31/15 0437  ALBUMIN 3.2*   No results for input(s): LIPASE, AMYLASE in the last 168 hours. No results for input(s): AMMONIA in the last 168 hours. CBC:  Recent Labs Lab 05/29/15 1450 05/30/15 0516 05/31/15 0437  WBC 5.8 5.1 5.4  NEUTROABS 4.0  --   --   HGB 9.3* 9.1* 8.8*  HCT 28.6* 28.9* 28.0*  MCV 84.9 84.5 85.4  PLT 139* 154 124*   Cardiac Enzymes: No results for input(s): CKTOTAL, CKMB, CKMBINDEX, TROPONINI in the last 168 hours. BNP: No results for input(s): PROBNP in the last 168 hours. D-Dimer: No results for input(s): DDIMER in the last 168 hours. CBG:  Recent Labs Lab 05/29/15 2303 05/30/15 0032 05/30/15 0802 05/31/15 0747 05/31/15 1137 05/31/15 1630  GLUCAP 95 92 140* 93 141* 132*   Hemoglobin A1C: No results for input(s): HGBA1C in the last 168 hours. Fasting Lipid Panel: No results for input(s): CHOL, HDL, LDLCALC, TRIG, CHOLHDL, LDLDIRECT in the last 168 hours. Thyroid Function Tests: No results for input(s): TSH, T4TOTAL, FREET4, T3FREE, THYROIDAB in the last 168 hours. Coagulation:  Recent Labs Lab 05/29/15 1450  LABPROT 21.5*  INR  1.88*   Anemia Panel:  Recent Labs Lab 05/30/15 0936  FERRITIN 406*  TIBC 290  IRON 29*  RETICCTPCT 0.8   Urine Drug Screen: Drugs of Abuse     Component Value Date/Time   LABOPIA NONE DETECTED 10/26/2007 1400   COCAINSCRNUR NONE DETECTED 10/26/2007 1400   LABBENZ NONE DETECTED 10/26/2007 1400   AMPHETMU NONE DETECTED 10/26/2007 1400   THCU NONE DETECTED 10/26/2007 1400   LABBARB  10/26/2007 1400    NONE DETECTED        DRUG SCREEN FOR MEDICAL PURPOSES ONLY.  IF CONFIRMATION IS NEEDED FOR ANY PURPOSE, NOTIFY LAB WITHIN 5 DAYS.    Micro Results: No results found for this or any previous visit (from the past 240 hour(s)). Studies/Results: Ct Chest W Contrast  05/31/2015   CLINICAL DATA:  Left arm,  pulmonary nodule.  EXAM: CT CHEST WITH CONTRAST  TECHNIQUE: Multidetector CT imaging of the chest was performed during intravenous contrast administration.  CONTRAST:  161mL OMNIPAQUE IOHEXOL 300 MG/ML  SOLN  COMPARISON:  CT 08/15/2014  FINDINGS: Mediastinum/Nodes: Mildly enlarged lymph nodes in the LEFT axilla measuring 12 mm (image 24, series 2 close. Moderately enlarged LEFT sub pectoralis lymph node measuring 17 mm short axis. There is thickening of the LEFT pectoralis muscle and soft tissues skin thickening overall LEFT chest wall.  There is a stent in the distal LEFT subclavian vein and proximal brachiocephalic vein. No no flow evident through this stent.  Venous collateral circulation along the RIGHT anterior chest wall associated with the RIGHT arm injection. With dilution of the RIGHT subclavian vein additionally. Collateral vasculature enters the superior vena cava of from through an internal mammary vein on the RIGHT.  No to paratracheal adenopathy. Enlarged subcarinal lymph node measures 14 mm. No central pulmonary embolism. No pericardial fluid.  Lungs/Pleura: There is diffuse ground-glass opacity within the lungs which is not changed from prior. Previous described LEFT upper lobe nodule has resolved. No new nodularity  Upper abdomen: There is a central venous line from inferior approach through the inferior vena cava with tips in the RIGHT atrium and distal SVC.  Musculoskeletal: No aggressive osseous lesion. There is fat density. For does with bridging osteophytosis of the spine.  IMPRESSION: 1. 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 2. Soft tissue swelling in the LEFT chest wall likely related to venous occlusion. There is prominent adenopathy in LEFT axilla and sub pectoralis location. Presumably this relates to venous occlusion. Cannot exclude a malignant process but is felt less likely. The adenopathy and swelling is new from 08/15/2014 3. Central venous line from a inferior approach  with tip is in the RIGHT atrium and distal SVC. 4. Diffuse ground-glass opacities not changed from prior. 5. Resolution of LEFT upper lobe pulmonary nodule.  Ureteral   Electronically Signed   By: Suzy Bouchard M.D.   On: 05/31/2015 16:13   Medications: I have reviewed the patient's current medications. Scheduled Meds: . aspirin  81 mg Oral Daily  . calcitRIOL  1 mcg Oral Q T,Th,Sa-HD  . calcium acetate  2,001 mg Oral TID WC  . ferric gluconate (FERRLECIT/NULECIT) IV  125 mg Intravenous Q T,Th,Sa-HD  . heparin subcutaneous  5,000 Units Subcutaneous 3 times per day  . labetalol  200 mg Oral TID  . loratadine  10 mg Oral Daily  . multivitamin  1 tablet Oral QHS   Continuous Infusions:  PRN Meds:.acetaminophen, capsaicin, ipratropium-albuterol, oxyCODONE-acetaminophen, polyethylene glycol Assessment/Plan: Active Problems:   End-stage renal  disease needing dialysis   ESRD needing dialysis   Left arm swelling   Anemia of chronic kidney failure  LUE Swelling: Received dialysis through tunneled right femoral cath today. Waiting on IR evaluation to further define his venous anatomy on the left to see if we have the option to replace a stent or not. CT with thrombosis of the left and right subclavian veins. Evidence of old stent in left subclavian that is now occluded. IR says they will not be able to see him until tomorrow morning.  - Appreciate IR consult - Awaiting evaluation of venous anatomy to determine the most appropriate vascular access for him going forward - Anticipate discharge tomorrow afternoon following IR procedure  ESRD (TTS): Received fem cath 9/5.atient has h/o noncompliance with HD, though that does not seem to be the issue at this time.  - Appreciate Nephrology consult - Dialysis today  Atrial Fibrillation: Patient discharged 8/31 on warfarin, but patient denies taking it after discharge. INR 1.88 on admission. Will stop warfarin in the setting of non-compliance and  RRR on exam today. Start ASA 81 mg.  - Continue Labetolol to 200 mg TID  - D/C warfarin  - Continue ASA 81 mg daily. No evidence that 325 mg is superior than 81 mg.    Possible Obstructive Sleep Apnea: Will need sleep study as an outpatient in order to obtain CPAP. - intermittently using CPAP  Type 2 Diabetes: No medications. Recent A1c of 5.7. CBGs between 85-141. Continue to montior.  - CBGs with meals  HTN: stable.  - continue Labetolol 200 mg TID  FEN/GI - Renal diet  Anemia of Chronic Disease: 2/2 CKD.   DVT Ppx: Heparin 5000 Units SQ q8hr   Dispo: Disposition is deferred at this time, awaiting improvement of current medical problems.  Anticipated discharge in approximately 1-2 day(s).   The patient does not have a current PCP (Provider Not In System) and does need an [facility] hospital follow-up appointment after discharge.  The patient does have transportation limitations that hinder transportation to clinic appointments.  .Services Needed at time of discharge: Y = Yes, Blank = No PT:   OT:   RN:   Equipment:   Other:     LOS: 3 days   Maryellen Pile, MD 06/01/2015, 6:44 AM

## 2015-06-01 NOTE — Care Management Important Message (Signed)
Important Message  Patient Details  Name: Noah Lewis MRN: [id number] Date of Birth: 02/15/1951   Medicare Important Message Given:  Yes-second notification given    Adron Bene, RN 06/01/2015, 12:28 PM

## 2015-06-01 NOTE — Progress Notes (Addendum)
[facility] Progress Note  Assessment/Plan: 1. Left arm swelling, unable to use AVF - hx SVC.  Tunneled fem cath placed by [facility] to use from here. IR did fistulogram which showed chronically occluded subclavian vein/ stent, no plans for trying to salvage fistula. See radiology notes. He is ok for dc from renal standpoint.   2. ESRD - TTS labs pending - improved after HD Tuesday  3. Anemia - Hgb down to 8.8  10% sat ferritin 406 -Fe started; add low dose Aranesp 25 CBC today pending 4. Secondary hyperparathyroidism - continue calcitriol 5. HTN/volume - he is at his dry weight today 6. Nutrition - alb 3.2 renal diet/vit 7. Disp - d/c back to NH  8. Afib - off coumadin 9. 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 [phone]    cell [phone] 06/01/2015, 11:52 AM    Subjective:   Left arm hurts  Objective Filed Vitals:   06/01/15 0702 06/01/15 0707 06/01/15 0800 06/01/15 0830  BP: 153/95 142/95 116/86 126/90  Pulse: 90 84 80 86  Temp: 97 F (36.1 C)     TempSrc: Oral     Resp: 20     Weight: 81.3 kg (179 lb 3.7 oz)     SpO2: 100%      Physical Exam General: supine on HD, HOH breathing easily Heart: RRR Lungs:  No rales Abdomen: soft NT Extremities: tr LE edema left arm with AVF still marked edema Dialysis Access: left AVF + bruit and new right fem TDC   Dialysis Orders: [facility] 4h 47min 79.5kg 2/2.25 bath L arm AVG Heparin 4700 Ven - 100 x 10 start 9/6 Calc - 1 ug tiw  Additional Objective Labs: Lab Results  Component Value Date   INR 1.88* 05/29/2015   INR 2.63* 05/22/2015   INR 2.63* 22/10/5425    Basic Metabolic Panel:  Recent Labs Lab 05/29/15 1450 05/30/15 0516 05/31/15 0437  NA 137 139 135  K 5.4* 6.0* 4.6  CL 98* 100* 96*  CO2 23 22 26   GLUCOSE 102* 85 91  BUN 97* 95* 57*  CREATININE 12.47* 13.13* 8.78*  CALCIUM 8.6* 8.6* 8.4*  PHOS  --   --  7.2*   Liver Function Tests:  Recent Labs Lab 05/31/15 0437  ALBUMIN 3.2*   CBC:  Recent Labs Lab 05/29/15 1450 05/30/15 0516 05/31/15 0437  WBC 5.8 5.1 5.4  NEUTROABS 4.0  --   --   HGB 9.3* 9.1* 8.8*  HCT 28.6* 28.9* 28.0*  MCV 84.9 84.5 85.4  PLT 139* 154 124*  CBG:  Recent Labs Lab 05/30/15 0032 05/30/15 0802 05/31/15 0747 05/31/15 1137 05/31/15 1630  GLUCAP 92 140* 93 141* 132*   Iron Studies:   Recent Labs  05/30/15 0936  IRON 29*  TIBC 290  FERRITIN 406*    Studies/Results: Ct Chest W Contrast  05/31/2015   CLINICAL DATA:  Left arm, pulmonary nodule.  EXAM: CT CHEST WITH CONTRAST  TECHNIQUE: Multidetector CT imaging of the chest was performed during intravenous contrast administration.  CONTRAST:  122mL OMNIPAQUE IOHEXOL 300 MG/ML  SOLN  COMPARISON:  CT 08/15/2014  FINDINGS: Mediastinum/Nodes: Mildly enlarged lymph nodes in the LEFT axilla measuring 12 mm (image 24, series 2 close. Moderately enlarged LEFT sub pectoralis lymph node measuring 17 mm short axis. There is thickening of the LEFT pectoralis muscle and soft tissues skin thickening overall LEFT chest wall.  There is a stent in the distal LEFT subclavian vein and  proximal brachiocephalic vein. No no flow evident through this stent.  Venous collateral circulation along the RIGHT anterior chest wall associated with the RIGHT arm injection. With dilution of the RIGHT subclavian vein additionally. Collateral vasculature enters the superior vena cava of from through an internal mammary vein on the RIGHT.  No to paratracheal adenopathy. Enlarged subcarinal lymph node measures 14 mm. No central pulmonary embolism. No pericardial fluid.  Lungs/Pleura: There is diffuse ground-glass opacity within the lungs which is not changed from prior. Previous described LEFT upper lobe nodule has resolved. No new nodularity  Upper abdomen: There is a central venous line from inferior approach through the inferior vena cava with tips in the RIGHT atrium and distal SVC.  Musculoskeletal: No aggressive osseous  lesion. There is fat density. For does with bridging osteophytosis of the spine.  IMPRESSION: 1. 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 2. Soft tissue swelling in the LEFT chest wall likely related to venous occlusion. There is prominent adenopathy in LEFT axilla and sub pectoralis location. Presumably this relates to venous occlusion. Cannot exclude a malignant process but is felt less likely. The adenopathy and swelling is new from 08/15/2014 3. Central venous line from a inferior approach with tip is in the RIGHT atrium and distal SVC. 4. Diffuse ground-glass opacities not changed from prior. 5. Resolution of LEFT upper lobe pulmonary nodule.  Ureteral   Electronically Signed   By: Suzy Bouchard M.D.   On: 05/31/2015 16:13   Medications:   . aspirin  81 mg Oral Daily  . calcitRIOL      . calcitRIOL  1 mcg Oral Q T,Th,Sa-HD  . calcium acetate  2,001 mg Oral TID WC  . darbepoetin (ARANESP) injection - DIALYSIS  25 mcg Intravenous Q Thu-HD  . ferric gluconate (FERRLECIT/NULECIT) IV  125 mg Intravenous Q T,Th,Sa-HD  . heparin subcutaneous  5,000 Units Subcutaneous 3 times per day  . labetalol  200 mg Oral TID  . loratadine  10 mg Oral Daily  . multivitamin  1 tablet Oral QHS

## 2015-06-01 NOTE — Care Management Note (Signed)
Case Management Note  Patient Details  Name: Ellwood Steidle MRN: [id number] Date of Birth: 08-09-1951  Subjective/Objective:               CM following for progression and d/c planning.     Action/Plan: Pt is resident of [facility] and will return to that facility  Expected Discharge Date:       06/03/2015           Expected Discharge Plan:  [place]  In-House Referral:  Clinical Social Work  Discharge planning Services  NA  Post Acute Care Choice:  NA Choice offered to:  NA  DME Arranged:    DME Agency:     HH Arranged:    [place] Agency:     Status of Service:  Completed, signed off  Medicare Important Message Given:    Date Medicare IM Given:    Medicare IM give by:    Date Additional Medicare IM Given:    Additional Medicare Important Message give by:     If discussed at [place] of Stay Meetings, dates discussed:    Additional Comments:  Adron Bene, RN 06/01/2015, 12:27 PM

## 2015-06-02 ENCOUNTER — Ambulatory Visit: Payer: Self-pay | Admitting: Family Medicine

## 2015-06-02 ENCOUNTER — Inpatient Hospital Stay (HOSPITAL_COMMUNITY): Payer: Medicaid Other

## 2015-06-02 DIAGNOSIS — I82729 Chronic embolism and thrombosis of deep veins of unspecified upper extremity: Secondary | ICD-10-CM | POA: Diagnosis present

## 2015-06-02 LAB — RENAL FUNCTION PANEL
ALBUMIN: 3.4 g/dL — AB (ref 3.5–5.0)
Anion gap: 12 (ref 5–15)
BUN: 35 mg/dL — AB (ref 6–20)
CHLORIDE: 98 mmol/L — AB (ref 101–111)
CO2: 26 mmol/L (ref 22–32)
CREATININE: 7.64 mg/dL — AB (ref 0.61–1.24)
Calcium: 9.4 mg/dL (ref 8.9–10.3)
GFR calc Af Amer: 8 mL/min — ABNORMAL LOW (ref >60)
GFR, EST NON AFRICAN AMERICAN: 7 mL/min — AB (ref >60)
Glucose, Bld: 87 mg/dL (ref 65–99)
PHOSPHORUS: 4.7 mg/dL — AB (ref 2.5–4.6)
Potassium: 3.9 mmol/L (ref 3.5–5.1)
Sodium: 136 mmol/L (ref 135–145)

## 2015-06-02 LAB — CBC
HCT: 31 % — ABNORMAL LOW (ref 39.0–52.0)
Hemoglobin: 9.6 g/dL — ABNORMAL LOW (ref 13.0–17.0)
MCH: 26.7 pg (ref 26.0–34.0)
MCHC: 31 g/dL (ref 30.0–36.0)
MCV: 86.4 fL (ref 78.0–100.0)
PLATELETS: 147 10*3/uL — AB (ref 150–400)
RBC: 3.59 MIL/uL — ABNORMAL LOW (ref 4.22–5.81)
RDW: 16.1 % — AB (ref 11.5–15.5)
WBC: 5.9 10*3/uL (ref 4.0–10.5)

## 2015-06-02 LAB — HEPATITIS B SURFACE ANTIGEN: HEP B S AG: NEGATIVE

## 2015-06-02 MED ORDER — IOHEXOL 300 MG/ML  SOLN
100.0000 mL | Freq: Once | INTRAMUSCULAR | Status: DC | PRN
  Administered 2015-06-02: 24 mL via INTRAVENOUS
  Filled 2015-06-02: qty 100

## 2015-06-02 MED ORDER — DARBEPOETIN ALFA 25 MCG/0.42ML IJ SOSY: 25.0000 ug | PREFILLED_SYRINGE | INTRAMUSCULAR | Status: DC

## 2015-06-02 MED ORDER — CALCITRIOL 0.5 MCG PO CAPS: 1.0000 ug | ORAL_CAPSULE | ORAL | Status: DC

## 2015-06-02 MED ORDER — ASPIRIN 81 MG PO CHEW: 81.0000 mg | CHEWABLE_TABLET | Freq: Every day | ORAL | Status: AC

## 2015-06-02 NOTE — Progress Notes (Signed)
Subjective: Patient is sitting up in chair resting comfortably. Has no complaints today. States he is ready to leave.   Objective: Vital signs in last 24 hours: Filed Vitals:   06/01/15 1711 06/01/15 2000 06/02/15 0409 06/02/15 1000  BP: 120/79 127/80 138/88 142/82  Pulse: 97 82 87 88  Temp: 97.8 F (36.6 C) 98.5 F (36.9 C) 97.4 F (36.3 C) 98 F (36.7 C)  TempSrc: Oral Oral Oral Oral  Resp: 18 16 16 18   Weight:      SpO2: 100% 98% 98% 98%   Weight change: -1 lb 13.3 oz (-0.83 kg)  Intake/Output Summary (Last 24 hours) at 06/02/15 1408 Last data filed at 06/02/15 1100  Gross per 24 hour  Intake    480 ml  Output      0 ml  Net    480 ml   Physical Exam  Constitutional: He is oriented to person, place, and time and well-developed, well-nourished, and in no distress.  HENT:  Head: Normocephalic and atraumatic.  Eyes: EOM are normal.  Neck: Normal range of motion. No tracheal deviation present.  Cardiovascular: Normal rate, regular rhythm, normal heart sounds and intact distal pulses.  Pulmonary/Chest: Effort normal. No respiratory distress. He has no wheezes.  Abdominal: Soft. He exhibits no distension. There is no tenderness. There is no rebound and no guarding.  Musculoskeletal:  LUE with prominent nonpitting edema from left pectoralis to fingertips. Shoulder and upper arm sensitive to palpation. Poorly demarcated erythema and warmth of left shoulder and axilla. No bruit present over AVF. No left radial pulse palpable. Firm in the left axilla without any definite lymph node mass palpated.  Neurological: He is alert and oriented to person, place, and time.  Skin: Skin is warm and dry.   Lab Results: Basic Metabolic Panel:  Recent Labs Lab 05/30/15 0516 05/30/15 0936 05/31/15 0437  NA 139  --  135  K 6.0*  --  4.6  CL 100*  --  96*  CO2 22  --  26  GLUCOSE 85  --  91  BUN 95*  --  57*  CREATININE 13.13*  --  8.78*  CALCIUM 8.6*  --  8.4*  MG   --  2.2  --   PHOS  --   --  7.2*   Liver Function Tests:  Recent Labs Lab 05/31/15 0437  ALBUMIN 3.2*   CBC:  Recent Labs Lab 05/29/15 1450 05/30/15 0516 05/31/15 0437  WBC 5.8 5.1 5.4  NEUTROABS 4.0  --   --   HGB 9.3* 9.1* 8.8*  HCT 28.6* 28.9* 28.0*  MCV 84.9 84.5 85.4  PLT 139* 154 124*   CBG:  Recent Labs Lab 05/30/15 0032 05/30/15 0802 05/31/15 0747 05/31/15 1137 05/31/15 1630 06/01/15 1139  GLUCAP 92 140* 93 141* 132* 85   Coagulation:  Recent Labs Lab 05/29/15 1450  LABPROT 21.5*  INR 1.88*   Anemia Panel:  Recent Labs Lab 05/30/15 0936  FERRITIN 406*  TIBC 290  IRON 29*  RETICCTPCT 0.8   Urine Drug Screen: Drugs of Abuse     Component Value Date/Time   LABOPIA NONE DETECTED 10/26/2007 1400   COCAINSCRNUR NONE DETECTED 10/26/2007 1400   LABBENZ NONE DETECTED 10/26/2007 1400   AMPHETMU NONE DETECTED 10/26/2007 1400   THCU NONE DETECTED 10/26/2007 1400   LABBARB  10/26/2007 1400    NONE DETECTED        DRUG SCREEN FOR MEDICAL PURPOSES ONLY.  IF CONFIRMATION IS NEEDED  FOR ANY PURPOSE, NOTIFY LAB WITHIN 5 DAYS.     Micro Results: Recent Results (from the past 240 hour(s))  MRSA PCR Screening     Status: Abnormal   Collection Time: 06/01/15  5:17 AM  Result Value Ref Range Status   MRSA by PCR POSITIVE (A) NEGATIVE Final    Comment:        The GeneXpert MRSA Assay (FDA approved for NASAL specimens only), is one component of a comprehensive MRSA colonization surveillance program. It is not intended to diagnose MRSA infection nor to guide or monitor treatment for MRSA infections. RESULT CALLED TO, READ BACK BY AND VERIFIED WITH: J NARAMDAS,RN AT 7124 06/01/15 BY K BARR    Studies/Results: No results found. Medications: I have reviewed the patient's current medications. Scheduled Meds: . aspirin  81 mg Oral Daily  . calcitRIOL  1 mcg Oral Q T,Th,Sa-HD  . calcium acetate  2,001 mg Oral TID WC  . Chlorhexidine Gluconate  Cloth  6 each Topical Q0600  . darbepoetin (ARANESP) injection - DIALYSIS  25 mcg Intravenous Q Thu-HD  . ferric gluconate (FERRLECIT/NULECIT) IV  125 mg Intravenous Q T,Th,Sa-HD  . heparin subcutaneous  5,000 Units Subcutaneous 3 times per day  . labetalol  200 mg Oral TID  . loratadine  10 mg Oral Daily  . multivitamin  1 tablet Oral QHS  . mupirocin ointment  1 application Nasal BID   Continuous Infusions:  PRN Meds:.acetaminophen, capsaicin, iohexol, ipratropium-albuterol, oxyCODONE-acetaminophen, polyethylene glycol Assessment/Plan: Active Problems:   End-stage renal disease needing dialysis   ESRD needing dialysis   Left arm swelling   Anemia of chronic kidney failure  LUE Swelling: Underwent fistulogram today. Fistula is patent with engorged venous outflow and tortuosity. Occluded left subclavian stent. Unable to get consent from patient for angioplasty. Currently has tunneled right femoral cath in place. Unusable left AVF. No further work up to be done as inpatient. Ready for discharge with follow up outpatient for assessment of permanent access.  - Appreciate IR consult - Anticipate discharge to SNF today pending bed availability  ESRD (TTS): Received fem cath 9/5. Patient has h/o noncompliance with HD, though that does not seem to be the issue at this time.  - Appreciate Nephrology consult  Atrial Fibrillation: Patient discharged 8/31 on warfarin, but patient denies taking it after discharge. INR 1.88 on admission. Will stop warfarin in the setting of non-compliance and RRR on exam today. Start ASA 81 mg.  - Continue Labetolol to 200 mg TID  - Continue ASA 81 mg daily.  Possible Obstructive Sleep Apnea: Will need sleep study as an outpatient in order to obtain CPAP. - intermittently using CPAP  Type 2 Diabetes: No medications. Recent A1c of 5.7. CBGs between 85-141. Continue to montior.  - CBGs with meals  HTN: stable.  - continue Labetolol 200 mg  TID  FEN/GI - Renal diet  Anemia of Chronic Disease: 2/2 CKD. On Fe supplementation with dialysis.   DVT Ppx: Heparin 5000 Units SQ q8hr   Dispo: Ready for discharge today   The patient does not have a current PCP (Provider Not In System) and does need an [facility] hospital follow-up appointment after discharge.  The patient does have transportation limitations that hinder transportation to clinic appointments.  .Services Needed at time of discharge: Y = Yes, Blank = No PT:   OT:   RN:   Equipment:   Other:     LOS: 4 days   Maryellen Pile, MD 06/02/2015, 5:80  PM

## 2015-06-02 NOTE — Clinical Social Work Note (Signed)
Patient medically stable for discharge back to [facility] today per MD. Discharge information transmitted to facility and patient will be transported by ambulance to SNF. CSW contacted patient's mother, [name] ([phone]) to inform her of discharge.   Angeliyah Kirkey Givens, MSW, LCSW Licensed Clinical Social Worker [place] [phone]

## 2015-06-02 NOTE — Clinical Social Work Note (Signed)
Clinical Social Work Assessment  Patient Details  Name: Noah Lewis MRN: [id number] Date of Birth: 08-24-1951  Date of referral:  06/02/15               Reason for consult:  Facility Placement                Permission sought to share information with:  Family Supports (Patient from skilled [place]) Permission granted to share information::  No  Name::     Roderic Ovens  Agency::  [name]  Relationship::  Admissions Director  Contact Information:  [phone]  Housing/Transportation Living arrangements for the past 2 months:  [place] of Information:  Patient, Other (Comment Required) (Admissions Director with [employer]) Patient Interpreter Needed:  None Criminal Activity/Legal Involvement Pertinent to Current Situation/Hospitalization:  No - Comment as needed Significant Relationships:  Parents (Mother [name]) Lives with:  Facility Resident Do you feel safe going back to the place where you live?  Yes Need for family participation in patient care:  Yes (Comment)  Care giving concerns:  None expressed by patient   Social Worker assessment / plan:  CSW talked briefly with patient regarding discharge planning. Patient confirmed that he will return to [name] and was informed by CSW that he will discharge today.  Employment status:  Disabled (Comment on whether or not currently receiving Disability) Insurance information:  Medicaid [place] PT Recommendations:  [place] / Referral to community resources:  [place]  Patient/Family's Response to care:  Not discussede.  Patient/Family's Understanding of and Emotional Response to Diagnosis, Current Treatment, and Prognosis:  Discussed.  Emotional Assessment Appearance:  Appears older than stated age Attitude/Demeanor/Rapport:   (Appropriate) Affect (typically observed):  Calm Orientation:  Oriented  to Self, Oriented to Place, Oriented to  Time, Oriented to Situation Alcohol / Substance use:   (Patient does not drink or use illicit drugs) Psych involvement (Current and /or in the community):  No (Comment)  Discharge Needs  Concerns to be addressed:  Discharge Planning Concerns Readmission within the last 30 days:  Yes Current discharge risk:  None Barriers to Discharge:  No Barriers Identified   Sable Feil, LCSW 06/02/2015, 2:51 PM

## 2015-06-02 NOTE — Progress Notes (Signed)
Patient Discharge: Patient being discharged to [facility] [place]  Education: Education given to patient by day shift RN  IV: IV removed  Telemetry: N/A  Follow-up appointments: Reviewed with patient by day shift RN  Prescriptions: N/A  Transportation: Patient being transported by EMS  Belongings: Belongings in patient possession  Shelbie Hutching, Therapist, sports, BSN

## 2015-06-02 NOTE — Procedures (Signed)
Interventional Radiology Procedure Note  Procedure: LUE fistulagram performed.   Findings:    The fistula is patent, with engorged venous outflow and tortuosity, compatible with increased pressure.   Stent at the subclavian vein is occluded, causing increased venous pressures.   Attempt to consent the patient for angioplasty, attempted revascularization, with full informed consent attempted.  This failed, with inability to reliably gain informed consent from the patient, and inability to have informed consent from mother and aunt -- both of whom were informed of findings on the phone.  They deferred to the patient, and were not willing to provide consent on the phone.   .  Complications: None Recommendations:  - If patient can reliably provide informed consent regarding angioplasty, would attempt revascularization.    Signed,  Dulcy Fanny. Earleen Newport, DO

## 2015-06-02 NOTE — Discharge Summary (Signed)
Name: Noah Lewis MRN: [id number] DOB: August 03, 1951 64 y.o. PCP: Provider Not In System  Date of Admission: 05/29/2015  1:13 PM Date of Discharge: 06/02/2015 Attending Physician: Annia Belt, MD  Discharge Diagnosis: Active Problems:   End-stage renal disease needing dialysis   ESRD needing dialysis   Left arm swelling   Anemia of chronic kidney failure  Discharge Medications:   Medication List    ASK your doctor about these medications        acetaminophen 325 MG tablet  Commonly known as:  TYLENOL  Take 1 tablet (325 mg total) by mouth every 6 (six) hours as needed for moderate pain.     calcium acetate 667 MG capsule  Commonly known as:  PHOSLO  Take 3 capsules (2,001 mg total) by mouth 3 (three) times daily with meals.     capsaicin 0.025 % cream  Commonly known as:  ZOSTRIX  Apply topically 2 (two) times daily as needed (low back pain).     ipratropium-albuterol 0.5-2.5 (3) MG/3ML Soln  Commonly known as:  DUONEB  Take 3 mLs by nebulization every 6 (six) hours as needed.     labetalol 200 MG tablet  Commonly known as:  NORMODYNE  Take 1 tablet (200 mg total) by mouth 3 (three) times daily.     loratadine 10 MG tablet  Commonly known as:  CLARITIN  Take 1 tablet (10 mg total) by mouth daily.     polyethylene glycol packet  Commonly known as:  MIRALAX / GLYCOLAX  Take 17 g by mouth daily as needed for mild constipation.     warfarin 5 MG tablet  Commonly known as:  COUMADIN  Take 1 tablet (5 mg total) by mouth daily at 6 PM.        Disposition and follow-up:   NoahLaterrian Lewis was discharged from [facility] in Stable condition.  At the hospital follow up visit please address:  1.  Please address Noah Lewis dialysis access. He currently has a tunneled right femoral cath in place with a non functional left AVF. Will need further evaluation for permanent access.   2.  Labs / imaging needed at time of follow-up: none  3.   Pending labs/ test needing follow-up: none  Follow-up Appointments:   Discharge Instructions:   Consultations: Treatment Team:  Angelia Mould, MD Roney Jaffe, MD  Procedures Performed:  Dg Chest 2 View  05/12/2015   CLINICAL DATA:  Patient with worsening shortness of breath. No fever.  EXAM: CHEST  2 VIEW  COMPARISON:  Chest radiograph 04/28/2015  FINDINGS: Stable cardiomegaly. Stent graft material projects over the left upper hemi thorax. Pulmonary vascular redistribution with mild interstitial bilateral pulmonary opacities. Mid thoracic spine degenerative changes.  IMPRESSION: Cardiomegaly with mild interstitial pulmonary edema.   Electronically Signed   By: Lovey Newcomer M.D.   On: 05/12/2015 21:20   Dg Lumbar Spine Complete  05/08/2015   CLINICAL DATA:  Fall while walking with bilateral leg pain and back pain.  EXAM: LUMBAR SPINE - COMPLETE 4+ VIEW  COMPARISON:  CT 11/19/2013  FINDINGS: Vertebral body alignment and heights are within normal. There is mild spondylosis throughout the lumbar spine to include facet arthropathy over the lower lumbar spine. There is no compression fracture or subluxation. There is disc space narrowing at the L5-S1 level. There are degenerative changes of the hips. Pelvic phleboliths are present.  IMPRESSION: No acute findings.  Mild spondylosis of the lumbar spine with disc  disease at the L5-S1 level.  Moderate degenerative change of the hips.   Electronically Signed   By: Marin Olp M.D.   On: 05/08/2015 17:19   Dg Pelvis 1-2 Views  05/08/2015   CLINICAL DATA:  [place] on sidewalk with bilateral leg pain, chronic back pain  EXAM: PELVIS - 1-2 VIEW  COMPARISON:  CT abdomen pelvis of 11/19/2013  FINDINGS: There is degenerative joint disease involving the hips, left greater than right with acetabular spurring and subchondral cyst formation. No acute fracture is seen. The pelvic rami are intact. The SI joints are corticated. There is degenerative change  present in the lower lumbar spine.  IMPRESSION: Degenerative changes in the hips and lower lumbar spine. No acute fracture.   Electronically Signed   By: Ivar Drape M.D.   On: 05/08/2015 17:18   Ct Chest W Contrast  05/31/2015   CLINICAL DATA:  Left arm, pulmonary nodule.  EXAM: CT CHEST WITH CONTRAST  TECHNIQUE: Multidetector CT imaging of the chest was performed during intravenous contrast administration.  CONTRAST:  122mL OMNIPAQUE IOHEXOL 300 MG/ML  SOLN  COMPARISON:  CT 08/15/2014  FINDINGS: Mediastinum/Nodes: Mildly enlarged lymph nodes in the LEFT axilla measuring 12 mm (image 24, series 2 close. Moderately enlarged LEFT sub pectoralis lymph node measuring 17 mm short axis. There is thickening of the LEFT pectoralis muscle and soft tissues skin thickening overall LEFT chest wall.  There is a stent in the distal LEFT subclavian vein and proximal brachiocephalic vein. No no flow evident through this stent.  Venous collateral circulation along the RIGHT anterior chest wall associated with the RIGHT arm injection. With dilution of the RIGHT subclavian vein additionally. Collateral vasculature enters the superior vena cava of from through an internal mammary vein on the RIGHT.  No to paratracheal adenopathy. Enlarged subcarinal lymph node measures 14 mm. No central pulmonary embolism. No pericardial fluid.  Lungs/Pleura: There is diffuse ground-glass opacity within the lungs which is not changed from prior. Previous described LEFT upper lobe nodule has resolved. No new nodularity  Upper abdomen: There is a central venous line from inferior approach through the inferior vena cava with tips in the RIGHT atrium and distal SVC.  Musculoskeletal: No aggressive osseous lesion. There is fat density. For does with bridging osteophytosis of the spine.  IMPRESSION: 1. 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 2. Soft tissue swelling in the LEFT chest wall likely related to venous occlusion. There is prominent  adenopathy in LEFT axilla and sub pectoralis location. Presumably this relates to venous occlusion. Cannot exclude a malignant process but is felt less likely. The adenopathy and swelling is new from 08/15/2014 3. Central venous line from a inferior approach with tip is in the RIGHT atrium and distal SVC. 4. Diffuse ground-glass opacities not changed from prior. 5. Resolution of LEFT upper lobe pulmonary nodule.  Ureteral   Electronically Signed   By: Suzy Bouchard M.D.   On: 05/31/2015 16:13   Dg Chest Port 1 View  05/30/2015   CLINICAL DATA:  64 year old male status post dialysis catheter insertion. Move  EXAM: PORTABLE CHEST - 1 VIEW  COMPARISON:  Radiograph dated 04/1915  FINDINGS: Single-view of the chest demonstrate an area of increased density in the left lower lung field which could be atelectatic changes. Pneumonia is not excluded. There is no pneumothorax. No significant pleural effusion. Stable cardiomegaly. Left subclavian or innominate vascular stent noted.  IMPRESSION: Left lower lung field atelectasis versus pneumonia. Clinical correlation and follow-up  recommended.   Electronically Signed   By: Anner Crete M.D.   On: 05/30/2015 01:31   Dg C-arm 1-60 Min-no Report  05/30/2015   CLINICAL DATA: surgery   C-ARM 1-60 MINUTES  Fluoroscopy was utilized by the requesting physician.  No radiographic  interpretation.    Admission HPI: Mr. Stenzel is a 64 yo male with ESRD, SVC Syndrome, DM type II, HTN, and COPD, presenting from dialysis with left arm swelling. He has an AVF in place for dialysis access. He reports worsening swelling, warmth, and pain of the LUE for 7 months. The fistula has continued to be used for dialysis during that time. His last dialysis was Saturday (TTS), but he reports only receiving about 30 minutes of dialysis because the lines were clogging with clots. He has been using his Capsaicin back cream on his LUE without relief.   He endorses occasional chills on 9/3,  but denies fever, N/V, CP, SOB, or abdominal pain.  Vascular surgery was consulted in the ED, with plans to place HD catheter tonight. Nephrology was consulted in the ED, with plans to dialyze patient after catheter placement.  Preliminary read of LUE doppler did not demonstrate DVT or thrombosis. Study was limited by edema and patient ROM.  Of note, patient was discharged from [facility] 8/10, where he had right sided SVC syndrome, partially resolved following RUE portocath removal. However, nephrology progress notes from that admission also denote "chronic L arm and neck swelling."   Hospital Course by problem list: Active Problems:   End-stage renal disease needing dialysis   ESRD needing dialysis   Left arm swelling   Anemia of chronic kidney failure   LUE Swelling: Unable to use left AVF due to swelling. He was seen by vascular surgery and a tunneled femoral catheter was placed on 9/5. Able to receive dialysis through the new femoral catheter. CT chest with contrast was done. Evidence of old vascular stent in the left subclavian vein which has thrombosed as well as thrombosed rigth subclavian vein. Extensvie collateral venous flow with dilation of the right internal mammary veins on the right. No SVC syndrome noted. Underwent a fistulogramof his left arm by IR for further evaluation of the vasculature. It showed a chronically occluded left subclavian vein/stent. They attempted to get consent from the patient for angioplasty but failed with inability to reliably gain informed consent from the patient and inability to have informed consent from mother and aunt by phone. They deferred to the patient and were not willing to provide consent. The left arm is not longer viable for dialysis currently. Spoke with nephrology about long term access and will need to be addressed as an outpatient. He can be discharged with the tunneled femoral cath in place. He is stable for discharge back to SNF with scheduled  dialysis TTS, next session being 9/10.  ESRD (TTS): Received fem cath 9/5. Patient has h/o noncompliance with HD, though that does not seem to be the issue at this time as patient has been at [facility] since last admission. Seen by Nephrology and received 2 dialysis sessions while admitted. Next session scheduled for 9/10. At dry weight of 173 lbs. Started on iron. Continued calcitriol for secondary hypoparathyroidism.   Atrial Fibrillation: Patient discharged 8/31 on warfarin, but patient denied taking it after discharge. INR 1.88 on admission. Stopped warfarin in the setting of non-compliance and RRR on exam today. Started ASA 81 mg.Continued Labetolol to 200 mg TID.  Possible Obstructive Sleep Apnea: Will need sleep  study as an outpatient in order to obtain CPAP. Intermittently using CPAP.  Type 2 Diabetes: No medications. Recent A1c of 5.7. CBGs between 85-141 during hospitalization.  HTN: BP was stable during hospitalization. Continued Labetolol 200 mg TID.  Anemia of Chronic Disease: 2/2 CKD. Iron studies with 10% saturation and a ferritin of 406. Started on Fe supplementation.   Discharge Vitals:   BP 142/82 mmHg  Pulse 88  Temp(Src) 98 F (36.7 C) (Oral)  Resp 18  Wt 173 lb 8 oz (78.7 kg)  SpO2 98%  Discharge Labs:  Results for orders placed or performed during the hospital encounter of 05/29/15 (from the past 24 hour(s))  CBC     Status: Abnormal   Collection Time: 06/02/15  1:34 PM  Result Value Ref Range   WBC 5.9 4.0 - 10.5 K/uL   RBC 3.59 (L) 4.22 - 5.81 MIL/uL   Hemoglobin 9.6 (L) 13.0 - 17.0 g/dL   HCT 31.0 (L) 39.0 - 52.0 %   MCV 86.4 78.0 - 100.0 fL   MCH 26.7 26.0 - 34.0 pg   MCHC 31.0 30.0 - 36.0 g/dL   RDW 16.1 (H) 11.5 - 15.5 %   Platelets 147 (L) 150 - 400 K/uL    Signed: Maryellen Pile, MD 06/02/2015, 2:23 PM

## 2015-06-02 NOTE — Progress Notes (Signed)
PT Cancellation Note  Patient Details Name: Noah Lewis MRN: [id number] DOB: 12-26-1950   Cancelled Treatment:    Reason Eval/Treat Not Completed: Patient declined, no reason specified.  Attempted to see patient x2 today.  Patient in procedure, and then patient declined due to pending d/c to SNF - "I'm pulling out of here soon".  Patient to have f/u therapy at SNF.   Despina Pole 06/02/2015, 5:26 PM Carita Pian. Sanjuana Kava, [place] Pager [phone]

## 2015-06-03 LAB — COMMENT2 - HEP PANEL

## 2015-06-03 LAB — HEPATITIS C ANTIBODY (REFLEX): HCV Ab: >11 s/co ratio — ABNORMAL HIGH (ref 0.0–0.9)

## 2015-06-07 ENCOUNTER — Non-Acute Institutional Stay (SKILLED_NURSING_FACILITY): Payer: Medicaid Other | Admitting: Adult Health

## 2015-06-07 DIAGNOSIS — M549 Dorsalgia, unspecified: Secondary | ICD-10-CM

## 2015-06-07 DIAGNOSIS — G4733 Obstructive sleep apnea (adult) (pediatric): Secondary | ICD-10-CM

## 2015-06-07 DIAGNOSIS — Z992 Dependence on renal dialysis: Secondary | ICD-10-CM

## 2015-06-07 DIAGNOSIS — I4891 Unspecified atrial fibrillation: Secondary | ICD-10-CM | POA: Diagnosis not present

## 2015-06-07 DIAGNOSIS — I82722 Chronic embolism and thrombosis of deep veins of left upper extremity: Secondary | ICD-10-CM

## 2015-06-07 DIAGNOSIS — E1122 Type 2 diabetes mellitus with diabetic chronic kidney disease: Secondary | ICD-10-CM | POA: Diagnosis not present

## 2015-06-07 DIAGNOSIS — J449 Chronic obstructive pulmonary disease, unspecified: Secondary | ICD-10-CM | POA: Diagnosis not present

## 2015-06-07 DIAGNOSIS — N186 End stage renal disease: Secondary | ICD-10-CM

## 2015-06-07 DIAGNOSIS — N189 Chronic kidney disease, unspecified: Secondary | ICD-10-CM

## 2015-06-07 DIAGNOSIS — I129 Hypertensive chronic kidney disease with stage 1 through stage 4 chronic kidney disease, or unspecified chronic kidney disease: Secondary | ICD-10-CM | POA: Diagnosis not present

## 2015-06-07 DIAGNOSIS — G8929 Other chronic pain: Secondary | ICD-10-CM

## 2015-06-12 ENCOUNTER — Non-Acute Institutional Stay (SKILLED_NURSING_FACILITY): Payer: Medicaid Other | Admitting: Internal Medicine

## 2015-06-12 DIAGNOSIS — M549 Dorsalgia, unspecified: Secondary | ICD-10-CM

## 2015-06-12 DIAGNOSIS — G4733 Obstructive sleep apnea (adult) (pediatric): Secondary | ICD-10-CM | POA: Diagnosis not present

## 2015-06-12 DIAGNOSIS — J449 Chronic obstructive pulmonary disease, unspecified: Secondary | ICD-10-CM | POA: Diagnosis not present

## 2015-06-12 DIAGNOSIS — E1122 Type 2 diabetes mellitus with diabetic chronic kidney disease: Secondary | ICD-10-CM

## 2015-06-12 DIAGNOSIS — N186 End stage renal disease: Secondary | ICD-10-CM | POA: Diagnosis not present

## 2015-06-12 DIAGNOSIS — Z992 Dependence on renal dialysis: Secondary | ICD-10-CM

## 2015-06-12 DIAGNOSIS — I82722 Chronic embolism and thrombosis of deep veins of left upper extremity: Secondary | ICD-10-CM

## 2015-06-12 DIAGNOSIS — G8929 Other chronic pain: Secondary | ICD-10-CM | POA: Diagnosis not present

## 2015-06-12 DIAGNOSIS — I480 Paroxysmal atrial fibrillation: Secondary | ICD-10-CM | POA: Diagnosis not present

## 2015-06-12 DIAGNOSIS — M7989 Other specified soft tissue disorders: Secondary | ICD-10-CM

## 2015-06-14 DIAGNOSIS — R768 Other specified abnormal immunological findings in serum: Secondary | ICD-10-CM | POA: Diagnosis present

## 2015-06-29 ENCOUNTER — Institutional Professional Consult (permissible substitution): Payer: Self-pay | Admitting: Pulmonary Disease

## 2015-06-29 ENCOUNTER — Encounter: Payer: Self-pay | Admitting: Internal Medicine

## 2015-07-06 ENCOUNTER — Non-Acute Institutional Stay (SKILLED_NURSING_FACILITY): Payer: Medicaid Other | Admitting: Adult Health

## 2015-07-06 DIAGNOSIS — I82722 Chronic embolism and thrombosis of deep veins of left upper extremity: Secondary | ICD-10-CM

## 2015-07-06 DIAGNOSIS — F319 Bipolar disorder, unspecified: Secondary | ICD-10-CM

## 2015-07-06 DIAGNOSIS — Z992 Dependence on renal dialysis: Secondary | ICD-10-CM | POA: Diagnosis not present

## 2015-07-06 DIAGNOSIS — F3130 Bipolar disorder, current episode depressed, mild or moderate severity, unspecified: Secondary | ICD-10-CM | POA: Diagnosis not present

## 2015-07-06 DIAGNOSIS — J449 Chronic obstructive pulmonary disease, unspecified: Secondary | ICD-10-CM

## 2015-07-06 DIAGNOSIS — N186 End stage renal disease: Secondary | ICD-10-CM

## 2015-07-06 DIAGNOSIS — E1122 Type 2 diabetes mellitus with diabetic chronic kidney disease: Secondary | ICD-10-CM | POA: Diagnosis not present

## 2015-07-06 DIAGNOSIS — I4891 Unspecified atrial fibrillation: Secondary | ICD-10-CM | POA: Diagnosis not present

## 2015-07-13 ENCOUNTER — Institutional Professional Consult (permissible substitution): Payer: Self-pay | Admitting: Pulmonary Disease

## 2015-07-17 ENCOUNTER — Encounter: Payer: Self-pay | Admitting: Adult Health

## 2015-07-17 NOTE — Progress Notes (Signed)
Patient ID: Noah Lewis, male   DOB: May 07, 1951, 64 y.o.   MRN: [id number]    Facility: [name]      Allergies  Allergen Reactions  . Penicillins Other (See Comments)    unsure    Chief Complaint  Patient presents with  . Hospitalization Follow-up     HPI:  He was hospitalized for increased weakness at home. He had left upper extremity edema; was found to his a/v fistula with decreased flow present. He had a temporary right femoral cath placed for dialysis while he was hospitalized. He is presently on coumadin therapy. He had an issue with adherence in the past; if this becomes we will need to place him asa as suggested by his discharge summary. His goal of care a this time is for short term rehab and to be placed in assisted living.    Past Medical History  Diagnosis Date  . Cancer ([place])   . Arthritis   . Renal disorder   . Hypertension   . Diabetes mellitus without complication ([place])   . Asthma   . COPD (chronic obstructive pulmonary disease) ([place])   . Coronary artery disease     Past Surgical History  Procedure Laterality Date  . No past surgeries    . Insertion of dialysis catheter    . Insertion of dialysis catheter N/A 05/29/2015    Procedure: INSERTION OF DIALYSIS CATHETER;  Surgeon: Angelia Mould, MD;  Location: [facility] OR;  Service: Vascular;  Laterality: N/A;    VITAL SIGNS BP 142/80 mmHg  Pulse 90  Ht 5\' 7"  (1.702 m)  Wt 141 lb (63.957 kg)  BMI 22.08 kg/m2  SpO2 94%  Patient's Medications  New Prescriptions   No medications on file  Previous Medications   CALCIUM ACETATE (PHOSLO) 667 MG CAPSULE    Take 3 capsules (2,001 mg total) by mouth 3 (three) times daily with meals.   CAPSAICIN (ZOSTRIX) 0.025 % CREAM    Apply topically 2 (two) times daily as needed (low back pain).   DARBEPOETIN ALFA (ARANESP) 25 MCG/0.42ML SOSY INJECTION    Inject 0.42 mLs (25 mcg total) into the vein every Thursday with hemodialysis.   IPRATROPIUM-ALBUTEROL (DUONEB) 0.5-2.5 (3) MG/3ML SOLN    Take 3 mLs by nebulization every 6 (six) hours as needed.   LABETALOL (NORMODYNE) 200 MG TABLET    Take 1 tablet (200 mg total) by mouth 3 (three) times daily.   LORATADINE (CLARITIN) 10 MG TABLET    Take 1 tablet (10 mg total) by mouth daily.   Coumadin 5 mg  5 mg nightly    POLYETHYLENE GLYCOL (MIRALAX / GLYCOLAX) PACKET    Take 17 g by mouth daily as needed for mild constipation.  Modified Medications   No medications on file  Discontinued Medications   No medications on file     SIGNIFICANT DIAGNOSTIC EXAMS   04-28-15: 2-d echo: Left ventricle: The cavity size was normal. Wall thickness was increased in a pattern of mild LVH. The estimated ejection fraction was 50%. Diffuse hypokinesis. Indeterminant diastolic function (atrial fibrillation). - Aortic valve: There was no stenosis. There was mild regurgitation. - Mitral valve: Mildly calcified annulus. There was mild regurgitation. - Left atrium: The atrium was moderately dilated. - Right ventricle: Poorly visualized. The cavity size was mildly dilated. Systolic function was mildly reduced. - Right atrium: The atrium was mildly dilated. - Tricuspid valve: Peak RV-RA gradient (S): 29 mm Hg. - Pulmonary arteries: PA peak pressure: 44  mm Hg (S). - Systemic veins: IVC measured 2.6 with < 50% respirophasic variation, suggesting RA pressure 15 mmHg.  05-08-15: lumbar spine x-ray: No acute findings. Mild spondylosis of the lumbar spine with disc disease at the L5-S1 level. Moderate degenerative change of the hips.  05-12-15: chest x-ray: Cardiomegaly with mild interstitial pulmonary edema.   05-30-15: chest x-ray: Left lower lung field atelectasis versus pneumonia. Clinical correlation and follow-up recommended.   05-31-15: ct of chest: 1. 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 2. Soft tissue swelling in the LEFT chest wall likely related to venous occlusion. There is prominent  adenopathy in LEFT axilla and sub pectoralis location. Presumably this relates to venous occlusion. Cannot exclude a malignant process but is felt less likely. The adenopathy and swelling is new from 08/15/2014 3. Central venous line from a inferior approach with tip is in the RIGHT atrium and distal SVC. 4. Diffuse ground-glass opacities not changed from prior. 5. Resolution of LEFT upper lobe pulmonary nodule.  Ureteral   06-02-15: left upper arm shuntogram: Status post left upper extremity fistulagram demonstrating occluded stent in the subclavian vein at the level of the left clavicular head, as a cause for increased venous pressure within the left upper extremity.  LABS REVIEWED:   04-28-15: hgb a1c 5.7 05-12-15: wbc 6.7 ;hgb 10.4; hct 32.1; mcv 85.1; plt 205; glucose 142; bun 90; creat 14.78; k+4.1; na++141; liver normal albumin 3.7 05-13-15: glucose 79; bun 48; creat 9.18; k+4.3; na++139; ca++8.6; phos 5.4; albumin 3.4 05-15-15; glucose 118; bun 68; creat 11.91; k+3.6; na++143; phos 7.6; allbumin 3.2 05-23-15: wbc 4.5 ;hgb 10.7; hct 32.6; mcv 84.5; plt 146; glucose 159; bun 73; creat 9.62; k+4.6; na++134; phos 6.0; albumin 3.1  05-30-15: wbc 5.1; hgb 9.1; hct 28.9; mcv 84.5; plt 154; glucose 85 bun 95; creat 13.13; k+ 6.0; na++139; mag 2.2; ferritin 406; iron 29; tibc 209; HIV: nonreactive 06-02-15: wbc 5.9; hgb 9.6; hct 31.0; mcv 86.4; plt 147; glucose 87; bun 35; creat 7.64; k+ 3.9; na++136; phos 4.7; albumin 3.4; hep C AB>11.0; hep B Ag: neg      Review of Systems  Constitutional: Negative for appetite change and fatigue.  Respiratory: Negative for cough, chest tightness and shortness of breath.   Cardiovascular: Negative for chest pain and leg swelling.  Gastrointestinal: Negative for abdominal pain and constipation.  Musculoskeletal: Negative for myalgias and arthralgias.  Skin: Negative for pallor.  Psychiatric/Behavioral: The patient is not nervous/anxious.       Physical Exam    Vitals reviewed. Constitutional: No distress.  Overweight   Eyes: Conjunctivae are normal.  Neck: Neck supple. No JVD present. No thyromegaly present.  Cardiovascular: Normal rate, regular rhythm and intact distal pulses.   Respiratory: Effort normal and breath sounds normal. No respiratory distress. He has no wheezes.  GI: Soft. Bowel sounds are normal. He exhibits no distension. There is no tenderness.  Musculoskeletal: He exhibits no edema.  Able to move all extremities   Lymphadenopathy:    He has no cervical adenopathy.  Neurological: He is alert.  Skin: Skin is warm and dry. He is not diaphoretic.  Right femoral dialysis access   Psychiatric: He has a normal mood and affect.       ASSESSMENT/ PLAN:  1. ESRD: on hemodialysis: will continue hemodialysis three days per week ;will continue phoslo 667 mg take 3 tabs with each meals; will continue to monitor his status.   2. 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 If he has difficulty adhering to this regimen will change him to asa at that time will monitor his status.  Will continue labetalol 200 mg three times daily fo rate control   3. Hypertension: will continue labetalol 200 mg three times daily   4. Chronic low back pain: will use capsaicin cream to his lower back twice daily as needed will monitor   5. Seasonal allergies: will continue claritin 10 mg daily   6. COPD: is presently not on medication therapies. Will not make changes will monitor  7. Diabetes is presently not on medications; his hgb a1c is 5.7  8. 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 Will not make changes will monitor     Time spent with patient 50  minutes >50% time spent counseling; reviewing medical record; tests; labs; and developing future plan of care     Ok Edwards NP [facility]  Contact [phone] Monday  through Friday 8am- 5pm  After hours call [phone]

## 2015-07-22 ENCOUNTER — Encounter: Payer: Self-pay | Admitting: Adult Health

## 2015-07-22 NOTE — Progress Notes (Signed)
Patient ID: Noah Lewis, male   DOB: 09-25-1950, 63 y.o.   MRN: [id number]    Facility: [name]      Allergies  Allergen Reactions  . Penicillins Other (See Comments)    unsure    Chief Complaint  Patient presents with  . Medical Management of Chronic Issues    HPI:  He is a long term resident of this facility being seen for the management of his chronic illnesses. Overall he is doing well. He is not voicing any complaints or complaints at this time. There are no nursing concerns at this time.  His coumadin has been stopped and is taking low dose asa.    Past Medical History  Diagnosis Date  . Cancer ([place])   . Arthritis   . Renal disorder   . Hypertension   . Diabetes mellitus without complication ([place])   . Asthma   . COPD (chronic obstructive pulmonary disease) ([place])   . Coronary artery disease     Past Surgical History  Procedure Laterality Date  . No past surgeries    . Insertion of dialysis catheter    . Insertion of dialysis catheter N/A 05/29/2015    Procedure: INSERTION OF DIALYSIS CATHETER;  Surgeon: Angelia Mould, MD;  Location: [facility] OR;  Service: Vascular;  Laterality: N/A;    VITAL SIGNS BP 150/90 mmHg  Pulse 102  Ht 5\' 7"  (1.702 m)  Wt 141 lb (63.957 kg)  BMI 22.08 kg/m2  SpO2 97%  Patient's Medications  New Prescriptions   No medications on file  Previous Medications   ACETAMINOPHEN (TYLENOL) 325 MG TABLET    Take 1 tablet (325 mg total) by mouth every 6 (six) hours as needed for moderate pain.   ASPIRIN 81 MG CHEWABLE TABLET    Chew 1 tablet (81 mg total) by mouth daily.   CALCITRIOL (ROCALTROL) 0.5 MCG CAPSULE    Take 2 capsules (1 mcg total) by mouth Every Tuesday,Thursday,and Saturday with dialysis.   CALCIUM ACETATE (PHOSLO) 667 MG CAPSULE    Take 3 capsules (2,001 mg total) by mouth 3 (three) times daily with meals.   CAPSAICIN (ZOSTRIX) 0.025 % CREAM    Apply topically 2 (two) times daily as needed (low back  pain).   DARBEPOETIN ALFA (ARANESP) 25 MCG/0.42ML SOSY INJECTION    Inject 0.42 mLs (25 mcg total) into the vein every Thursday with hemodialysis.   IPRATROPIUM-ALBUTEROL (DUONEB) 0.5-2.5 (3) MG/3ML SOLN    Take 3 mLs by nebulization every 6 (six) hours as needed.   LABETALOL (NORMODYNE) 200 MG TABLET    Take 1 tablet (200 mg total) by mouth 3 (three) times daily.   LORATADINE (CLARITIN) 10 MG TABLET    Take 1 tablet (10 mg total) by mouth daily.   POLYETHYLENE GLYCOL (MIRALAX / GLYCOLAX) PACKET    Take 17 g by mouth daily as needed for mild constipation.  Modified Medications   No medications on file  Discontinued Medications   No medications on file     SIGNIFICANT DIAGNOSTIC EXAMS   04-28-15: 2-d echo: Left ventricle: The cavity size was normal. Wall thickness was increased in a pattern of mild LVH. The estimated ejection fraction was 50%. Diffuse hypokinesis. Indeterminant diastolic function (atrial fibrillation). - Aortic valve: There was no stenosis. There was mild regurgitation. - Mitral valve: Mildly calcified annulus. There was mild regurgitation. - Left atrium: The atrium was moderately dilated. - Right ventricle: Poorly visualized. The cavity size was mildly dilated. Systolic function  was mildly reduced. - Right atrium: The atrium was mildly dilated. - Tricuspid valve: Peak RV-RA gradient (S): 29 mm Hg. - Pulmonary arteries: PA peak pressure: 44 mm Hg (S). - Systemic veins: IVC measured 2.6 with < 50% respirophasic variation, suggesting RA pressure 15 mmHg.  05-08-15: lumbar spine x-ray: No acute findings. Mild spondylosis of the lumbar spine with disc disease at the L5-S1 level. Moderate degenerative change of the hips.  05-12-15: chest x-ray: Cardiomegaly with mild interstitial pulmonary edema.   05-30-15: chest x-ray: Left lower lung field atelectasis versus pneumonia. Clinical correlation and follow-up recommended.   05-31-15: ct of chest: 1. 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 2. Soft tissue swelling in the LEFT chest wall likely related to venous occlusion. There is prominent adenopathy in LEFT axilla and sub pectoralis location. Presumably this relates to venous occlusion. Cannot exclude a malignant process but is felt less likely. The adenopathy and swelling is new from 08/15/2014 3. Central venous line from a inferior approach with tip is in the RIGHT atrium and distal SVC. 4. Diffuse ground-glass opacities not changed from prior. 5. Resolution of LEFT upper lobe pulmonary nodule.  Ureteral   06-02-15: left upper arm shuntogram: Status post left upper extremity fistulagram demonstrating occluded stent in the subclavian vein at the level of the left clavicular head, as a cause for increased venous pressure within the left upper extremity.  LABS REVIEWED:   04-28-15: hgb a1c 5.7 05-12-15: wbc 6.7 ;hgb 10.4; hct 32.1; mcv 85.1; plt 205; glucose 142; bun 90; creat 14.78; k+4.1; na++141; liver normal albumin 3.7 05-13-15: glucose 79; bun 48; creat 9.18; k+4.3; na++139; ca++8.6; phos 5.4; albumin 3.4 05-15-15; glucose 118; bun 68; creat 11.91; k+3.6; na++143; phos 7.6; allbumin 3.2 05-23-15: wbc 4.5 ;hgb 10.7; hct 32.6; mcv 84.5; plt 146; glucose 159; bun 73; creat 9.62; k+4.6; na++134; phos 6.0; albumin 3.1  05-30-15: wbc 5.1; hgb 9.1; hct 28.9; mcv 84.5; plt 154; glucose 85 bun 95; creat 13.13; k+ 6.0; na++139; mag 2.2; ferritin 406; iron 29; tibc 209; HIV: nonreactive 06-02-15: wbc 5.9; hgb 9.6; hct 31.0; mcv 86.4; plt 147; glucose 87; bun 35; creat 7.64; k+ 3.9; na++136; phos 4.7; albumin 3.4; hep C AB>11.0; hep B Ag: neg     Review of Systems Constitutional: Negative for appetite change and fatigue.  Respiratory: Negative for cough, chest tightness and shortness of breath.   Cardiovascular: Negative for chest pain and leg swelling.  Gastrointestinal: Negative for abdominal pain and constipation.  Musculoskeletal: Negative for myalgias and arthralgias.    Skin: Negative for pallor.  Psychiatric/Behavioral: The patient is not nervous/anxious.   Physical Exam Vitals reviewed. Constitutional: No distress.  Overweight   Eyes: Conjunctivae are normal.  Neck: Neck supple. No JVD present. No thyromegaly present.  Cardiovascular: Normal rate, regular rhythm and intact distal pulses.   Respiratory: Effort normal and breath sounds normal. No respiratory distress. He has no wheezes.  GI: Soft. Bowel sounds are normal. He exhibits no distension. There is no tenderness.  Musculoskeletal: He exhibits no edema.  Able to move all extremities   Lymphadenopathy:    He has no cervical adenopathy.  Neurological: He is alert.  Skin: Skin is warm and dry. He is not diaphoretic.  Right femoral dialysis access   Psychiatric: He has a normal mood and affect.       ASSESSMENT/ PLAN:  1. ESRD: on hemodialysis: will continue hemodialysis three days per week ;will continue phoslo 667 mg take 3 tabs  with each meals; will continue to monitor his status.   2. 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  Will continue labetalol 200 mg three times daily fo rate control   3. Hypertension: will continue labetalol 200 mg three times daily   4. Chronic low back pain: will use capsaicin cream to his lower back twice daily as needed will monitor   5. Seasonal allergies: will continue claritin 10 mg daily   6. COPD: is presently not on medication therapies. Will not make changes will monitor  7. Diabetes is presently not on medications; his hgb a1c is 5.7  8. 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 Will not make changes will monitor   Ok Edwards NP [facility]  Contact [phone] Monday through Friday 8am- 5pm  After hours call [phone]

## 2015-07-29 ENCOUNTER — Encounter: Payer: Self-pay | Admitting: Adult Health

## 2015-07-31 ENCOUNTER — Encounter: Payer: Self-pay | Admitting: Internal Medicine

## 2015-07-31 ENCOUNTER — Non-Acute Institutional Stay (SKILLED_NURSING_FACILITY): Payer: Medicaid Other | Admitting: Internal Medicine

## 2015-07-31 DIAGNOSIS — N186 End stage renal disease: Secondary | ICD-10-CM | POA: Diagnosis not present

## 2015-07-31 DIAGNOSIS — I151 Hypertension secondary to other renal disorders: Secondary | ICD-10-CM | POA: Diagnosis not present

## 2015-07-31 DIAGNOSIS — N2889 Other specified disorders of kidney and ureter: Secondary | ICD-10-CM

## 2015-07-31 DIAGNOSIS — I4891 Unspecified atrial fibrillation: Secondary | ICD-10-CM | POA: Diagnosis not present

## 2015-07-31 DIAGNOSIS — E1122 Type 2 diabetes mellitus with diabetic chronic kidney disease: Secondary | ICD-10-CM

## 2015-07-31 NOTE — Progress Notes (Signed)
MRN: [id number] Name: Noah Lewis  Sex: male Age: 64 y.o. DOB: 27-Jun-1951  [place] #: [name] Facility/Room:130 Level Of Care: SNF Provider: Inocencio Homes D Emergency Contacts: Extended Emergency Contact Information Primary Emergency Contact: Hayes,Barbara Address: [place], [place] [address] [place] Phone: [phone] Relation: Mother  Code Status:   Allergies: Penicillins  Chief Complaint  Patient presents with  . Medical Management of Chronic Issues    HPI: Patient is 64 y.o. male with OA, HTN, DM2 COPD and CAD who is being seen to today for routine issues of AF, DM2 and HTN.  Past Medical History  Diagnosis Date  . Cancer ([place])   . Arthritis   . Renal disorder   . Hypertension   . Diabetes mellitus without complication ([place])   . Asthma   . COPD (chronic obstructive pulmonary disease) ([place])   . Coronary artery disease     Past Surgical History  Procedure Laterality Date  . No past surgeries    . Insertion of dialysis catheter    . Insertion of dialysis catheter N/A 05/29/2015    Procedure: INSERTION OF DIALYSIS CATHETER;  Surgeon: Angelia Mould, MD;  Location: [facility] OR;  Service: Vascular;  Laterality: N/A;      Medication List       This list is accurate as of: 07/31/15  9:52 PM.  Always use your most recent med list.               acetaminophen 325 MG tablet  Commonly known as:  TYLENOL  Take 1 tablet (325 mg total) by mouth every 6 (six) hours as needed for moderate pain.     aspirin 81 MG chewable tablet  Chew 1 tablet (81 mg total) by mouth daily.     calcitRIOL 0.5 MCG capsule  Commonly known as:  ROCALTROL  Take 2 capsules (1 mcg total) by mouth Every Tuesday,Thursday,and Saturday with dialysis.     calcium acetate 667 MG capsule  Commonly known as:  PHOSLO  Take 3 capsules (2,001 mg total) by mouth 3 (three) times daily with meals.     capsaicin 0.025 % cream  Commonly known as:   ZOSTRIX  Apply topically 2 (two) times daily as needed (low back pain).     Darbepoetin Alfa 25 MCG/0.42ML Sosy injection  Commonly known as:  ARANESP  Inject 0.42 mLs (25 mcg total) into the vein every Thursday with hemodialysis.     ipratropium-albuterol 0.5-2.5 (3) MG/3ML Soln  Commonly known as:  DUONEB  Take 3 mLs by nebulization every 6 (six) hours as needed.     labetalol 200 MG tablet  Commonly known as:  NORMODYNE  Take 1 tablet (200 mg total) by mouth 3 (three) times daily.     loratadine 10 MG tablet  Commonly known as:  CLARITIN  Take 1 tablet (10 mg total) by mouth daily.     polyethylene glycol packet  Commonly known as:  MIRALAX / GLYCOLAX  Take 17 g by mouth daily as needed for mild constipation.        No orders of the defined types were placed in this encounter.     There is no immunization history on file for this patient.  Social History  Substance Use Topics  . Smoking status: Never Smoker   . Smokeless tobacco: Never Used  . Alcohol Use: No    Review of Systems  DATA OBTAINED: from patient, nurse, medical record, family member GENERAL:  no fevers, fatigue, appetite changes SKIN: No itching, rash HEENT: No complaint RESPIRATORY: No cough, wheezing, SOB CARDIAC: No chest pain, palpitations, lower extremity edema  GI: No abdominal pain, No N/V/D or constipation, No heartburn or reflux  GU: No dysuria, frequency or urgency, or incontinence  MUSCULOSKELETAL: No unrelieved bone/joint pain NEUROLOGIC: No headache, dizziness  PSYCHIATRIC: No overt anxiety or sadness  Filed Vitals:   07/31/15 1536  BP: 144/78  Pulse: 102  Temp: 97.9 F (36.6 C)  Resp: 20    Physical Exam  GENERAL APPEARANCE: Alert, conversant, No acute distress  SKIN: No diaphoresis rash, or wounds HEENT: Unremarkable RESPIRATORY: Breathing is even, unlabored. Lung sounds are clear   CARDIOVASCULAR: Heart RRR no murmurs, rubs or gallops. No peripheral edema   GASTROINTESTINAL: Abdomen is soft, non-tender, not distended w/ normal bowel sounds.  GENITOURINARY: Bladder non tender, not distended  MUSCULOSKELETAL: No abnormal joints or musculature NEUROLOGIC: Cranial nerves 2-12 grossly intact. Moves all extremities PSYCHIATRIC: Mood and affect appropriate to situation, no behavioral issues  Patient Active Problem List   Diagnosis Date Noted  . Hepatitis C antibody test positive 06/14/2015  . Chronic venous embolism and thrombosis of deep veins of upper extremity ([place]) 06/02/2015  . Anemia of chronic kidney failure   . Left arm swelling   . End-stage renal disease needing dialysis ([place]) 05/29/2015  . ESRD needing dialysis ([place]) 05/29/2015  . 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 COPD, moderate ([place]) 05/24/2015  . OSA (obstructive sleep apnea) 05/24/2015  . 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 ESRD on hemodialysis ([place]) 05/13/2015  . New onset atrial fibrillation ([place]) 05/13/2015  . Volume overload 04/28/2015  . Bipolar depression ([place]) 04/28/2015  . HTN (hypertension) 04/28/2015  . Chronic back pain 04/28/2015  . Facial swelling 04/28/2015  . Left upper extremity swelling     CBC    Component Value Date/Time   WBC 5.9 06/02/2015 1334   RBC 3.59* 06/02/2015 1334   RBC 3.51* 05/30/2015 0936   HGB 9.6* 06/02/2015 1334   HCT 31.0* 06/02/2015 1334   PLT 147* 06/02/2015 1334   MCV 86.4 06/02/2015 1334   LYMPHSABS 0.8 05/29/2015 1450   MONOABS 0.7 05/29/2015 1450   EOSABS 0.3 05/29/2015 1450   BASOSABS 0.0 05/29/2015 1450    CMP     Component Value Date/Time   NA 136 06/02/2015 1334   K 3.9 06/02/2015 1334   CL 98* 06/02/2015 1334   CO2 26 06/02/2015 1334   GLUCOSE 87 06/02/2015 1334   BUN 35* 06/02/2015 1334   CREATININE 7.64* 06/02/2015 1334   CALCIUM 9.4 06/02/2015 1334   PROT 7.4 05/12/2015 2113   ALBUMIN 3.4* 06/02/2015 1334   AST 29 05/12/2015 2113    ALT 27 05/12/2015 2113   ALKPHOS 81 05/12/2015 2113   BILITOT 0.6 05/12/2015 2113   GFRNONAA 7* 06/02/2015 1334   GFRAA 8* 06/02/2015 1334    Assessment and Plan  HTN (hypertension) Controlled on labatolol 20  Mg TID; Plan - con labatolol  New onset atrial fibrillation Pt was d/c from hosp on coumadin but he stopped taking it; pt now on ASA 81 mg daily as prophylaxis  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

## 2015-07-31 NOTE — Assessment & Plan Note (Signed)
On no meds, A1c was 5.7; Plan - monitor at intervals and prn

## 2015-07-31 NOTE — Assessment & Plan Note (Signed)
Controlled on labatolol 20  Mg TID; Plan - con labatolol

## 2015-07-31 NOTE — Assessment & Plan Note (Signed)
Pt was d/c from hosp on coumadin but he stopped taking it; pt now on ASA 81 mg daily as prophylaxis

## 2015-08-01 ENCOUNTER — Non-Acute Institutional Stay (SKILLED_NURSING_FACILITY): Payer: Medicaid Other | Admitting: Adult Health

## 2015-08-01 ENCOUNTER — Encounter: Payer: Self-pay | Admitting: Adult Health

## 2015-08-01 DIAGNOSIS — Z992 Dependence on renal dialysis: Secondary | ICD-10-CM | POA: Diagnosis not present

## 2015-08-01 DIAGNOSIS — N186 End stage renal disease: Secondary | ICD-10-CM

## 2015-08-01 DIAGNOSIS — I4891 Unspecified atrial fibrillation: Secondary | ICD-10-CM

## 2015-08-01 DIAGNOSIS — N189 Chronic kidney disease, unspecified: Secondary | ICD-10-CM | POA: Diagnosis not present

## 2015-08-01 DIAGNOSIS — E1122 Type 2 diabetes mellitus with diabetic chronic kidney disease: Secondary | ICD-10-CM

## 2015-08-01 DIAGNOSIS — I129 Hypertensive chronic kidney disease with stage 1 through stage 4 chronic kidney disease, or unspecified chronic kidney disease: Secondary | ICD-10-CM

## 2015-08-01 NOTE — Progress Notes (Signed)
Patient ID: Noah Lewis, male   DOB: Mar 05, 1951, 64 y.o.   MRN: [id number]    Facility: [name]      Allergies  Allergen Reactions  . Penicillins Other (See Comments)    unsure    Chief Complaint  Patient presents with  . Discharge Note    HPI:  He is being discharged to group home in [place]. He will need a front wheel walker. He will need home health for pt/ot. He will need his prescriptions to be written and will follow up with medically with nephrology and group home physician    Past Medical History  Diagnosis Date  . Cancer ([place])   . Arthritis   . Renal disorder   . Hypertension   . Diabetes mellitus without complication ([place])   . Asthma   . COPD (chronic obstructive pulmonary disease) ([place])   . Coronary artery disease     Past Surgical History  Procedure Laterality Date  . No past surgeries    . Insertion of dialysis catheter    . Insertion of dialysis catheter N/A 05/29/2015    Procedure: INSERTION OF DIALYSIS CATHETER;  Surgeon: Angelia Mould, MD;  Location: [facility] OR;  Service: Vascular;  Laterality: N/A;    VITAL SIGNS BP 153/73 mmHg  Pulse 80  Ht 5\' 7"  (1.702 m)  Wt 141 lb (63.957 kg)  BMI 22.08 kg/m2  SpO2 93%  Patient's Medications  New Prescriptions   No medications on file  Previous Medications   ACETAMINOPHEN (TYLENOL) 325 MG TABLET    Take 1 tablet (325 mg total) by mouth every 6 (six) hours as needed for moderate pain.   ASPIRIN 81 MG CHEWABLE TABLET    Chew 1 tablet (81 mg total) by mouth daily.   CALCITRIOL (ROCALTROL) 0.5 MCG CAPSULE    Take 2 capsules (1 mcg total) by mouth Every Tuesday,Thursday,and Saturday with dialysis.   CALCIUM ACETATE (PHOSLO) 667 MG CAPSULE    Take 3 capsules (2,001 mg total) by mouth 3 (three) times daily with meals.   CAPSAICIN (ZOSTRIX) 0.025 % CREAM    Apply topically 2 (two) times daily as needed (low back pain).   DARBEPOETIN ALFA (ARANESP) 25 MCG/0.42ML SOSY INJECTION     Inject 0.42 mLs (25 mcg total) into the vein every Thursday with hemodialysis.   IPRATROPIUM-ALBUTEROL (DUONEB) 0.5-2.5 (3) MG/3ML SOLN    Take 3 mLs by nebulization every 6 (six) hours as needed.   LABETALOL (NORMODYNE) 200 MG TABLET    Take 1 tablet (200 mg total) by mouth 3 (three) times daily.   LORATADINE (CLARITIN) 10 MG TABLET    Take 1 tablet (10 mg total) by mouth daily.   POLYETHYLENE GLYCOL (MIRALAX / GLYCOLAX) PACKET    Take 17 g by mouth daily as needed for mild constipation.  Modified Medications   No medications on file  Discontinued Medications   No medications on file     SIGNIFICANT DIAGNOSTIC EXAMS  04-28-15: 2-d echo: Left ventricle: The cavity size was normal. Wall thickness was increased in a pattern of mild LVH. The estimated ejection fraction was 50%. Diffuse hypokinesis. Indeterminant diastolic function (atrial fibrillation). - Aortic valve: There was no stenosis. There was mild regurgitation. - Mitral valve: Mildly calcified annulus. There was mild regurgitation. - Left atrium: The atrium was moderately dilated. - Right ventricle: Poorly visualized. The cavity size was mildly dilated. Systolic function was mildly reduced. - Right atrium: The atrium was mildly dilated. - Tricuspid valve:  Peak RV-RA gradient (S): 29 mm Hg. - Pulmonary arteries: PA peak pressure: 44 mm Hg (S). - Systemic veins: IVC measured 2.6 with < 50% respirophasic variation, suggesting RA pressure 15 mmHg.  05-08-15: lumbar spine x-ray: No acute findings. Mild spondylosis of the lumbar spine with disc disease at the L5-S1 level. Moderate degenerative change of the hips.  05-12-15: chest x-ray: Cardiomegaly with mild interstitial pulmonary edema.   05-30-15: chest x-ray: Left lower lung field atelectasis versus pneumonia. Clinical correlation and follow-up recommended.   05-31-15: ct of chest: 1. 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 2. Soft tissue swelling in the LEFT chest wall likely  related to venous occlusion. There is prominent adenopathy in LEFT axilla and sub pectoralis location. Presumably this relates to venous occlusion. Cannot exclude a malignant process but is felt less likely. The adenopathy and swelling is new from 08/15/2014 3. Central venous line from a inferior approach with tip is in the RIGHT atrium and distal SVC. 4. Diffuse ground-glass opacities not changed from prior. 5. Resolution of LEFT upper lobe pulmonary nodule.  Ureteral   06-02-15: left upper arm shuntogram: Status post left upper extremity fistulagram demonstrating occluded stent in the subclavian vein at the level of the left clavicular head, as a cause for increased venous pressure within the left upper extremity.    LABS REVIEWED:   04-28-15: hgb a1c 5.7 05-12-15: wbc 6.7 ;hgb 10.4; hct 32.1; mcv 85.1; plt 205; glucose 142; bun 90; creat 14.78; k+4.1; na++141; liver normal albumin 3.7 05-13-15: glucose 79; bun 48; creat 9.18; k+4.3; na++139; ca++8.6; phos 5.4; albumin 3.4 05-15-15; glucose 118; bun 68; creat 11.91; k+3.6; na++143; phos 7.6; allbumin 3.2 05-23-15: wbc 4.5 ;hgb 10.7; hct 32.6; mcv 84.5; plt 146; glucose 159; bun 73; creat 9.62; k+4.6; na++134; phos 6.0; albumin 3.1  05-30-15: wbc 5.1; hgb 9.1; hct 28.9; mcv 84.5; plt 154; glucose 85 bun 95; creat 13.13; k+ 6.0; na++139; mag 2.2; ferritin 406; iron 29; tibc 209; HIV: nonreactive 06-02-15: wbc 5.9; hgb 9.6; hct 31.0; mcv 86.4; plt 147; glucose 87; bun 35; creat 7.64; k+ 3.9; na++136; phos 4.7; albumin 3.4; hep C AB>11.0; hep B Ag: neg      Review of Systems Constitutional: Negative for appetite change and fatigue.  Respiratory: Negative for cough, chest tightness and shortness of breath.   Cardiovascular: Negative for chest pain and leg swelling.  Gastrointestinal: Negative for abdominal pain and constipation.  Musculoskeletal: Negative for myalgias and arthralgias.  Skin: Negative for pallor.  Psychiatric/Behavioral: The patient  is not nervous/anxious.     Physical Exam Vitals reviewed. Constitutional: No distress.  Overweight   Eyes: Conjunctivae are normal.  Neck: Neck supple. No JVD present. No thyromegaly present.  Cardiovascular: Normal rate, regular rhythm and intact distal pulses.   Respiratory: Effort normal and breath sounds normal. No respiratory distress. He has no wheezes.  GI: Soft. Bowel sounds are normal. He exhibits no distension. There is no tenderness.  Musculoskeletal: He exhibits no edema.  Able to move all extremities   Lymphadenopathy:    He has no cervical adenopathy.  Neurological: He is alert.  Skin: Skin is warm and dry. He is not diaphoretic.  Right femoral dialysis access   Psychiatric: He has a normal mood and affect.       ASSESSMENT/ PLAN:  Will discharge to group home with home health for pt/ot to evaluate and treat as indicated for gait and balance. He will need a front wheel walker in order  for him to maintain his current level of independence with his adl's. His prescriptions have been written for a 30 day supply of medications. He will follow up medically with nephrology and will the physician at the group home.    Time spent with patient 45   minutes >50% time spent counseling; reviewing medical record; tests; labs; and developing future plan of care      Ok Edwards NP [facility]  Contact [phone] Monday through Friday 8am- 5pm  After hours call [phone]

## 2015-09-11 NOTE — Progress Notes (Signed)
Subjective:  Noah Lewis was seen and examined this morning. He had no acute complaints. Patient has been consistently refusing his CPAP.   Objective: Vital signs in last 24 hours: Filed Vitals:   05/23/15 1300 05/23/15 1314 05/23/15 1325 05/23/15 1432  BP: 92/73 102/67 109/69 107/64  Pulse: 88 84 68 82  Temp:   97.6 F (36.4 C) 97.8 F (36.6 C)  TempSrc:   Oral Oral  Resp:   18 18  Height:      Weight:   164 lb 0.4 oz (74.4 kg)   SpO2:    100%   Weight change:  No intake or output data in the 24 hours ending 09/11/15 1221 Physical Exam:  General:  Sitting up on edge of bed, eating breakfast, in NAD HEENT:  Pingueculae in both sclera. Facial and upper extremity swelling improved  Cardiac:  Irregularly irregular with normal rate. No murmurs rubs or gallops Pulmonary:  Clear to auscultation bilaterally without wheezes or crackles. Abdominal:  +BS. Soft, nontender, nondistended Neuro:  Awake and alert, responding appropriately to questions. Hard of hearing.    Continuous Infusions:none PRN Meds:.   Assessment/Plan:  Volume Overload 2/2 ESRD and Hemodialysis Non-Adherence: plan for TTHS schedule. Appreciate nephro assistance and patience.   - He has placement with [facility].  He has SNF bed at [facility].  Chronic low back pain:  I am not sure if he is using the heat packs/ice packs that are ordered prn.   It looks like he is using the Voltaren gel.  He is on capsaicin cream as outpatient for this back pain. 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 Was advised by Audiology to contact [facility], Nose, and Throat. Should hear from [facility], [facility] regarding donated hearing aids, however have not heard back.   New Onset Atrial Fibrillation: INR therapeutic on coumadin.  Rate controlled on BB. - Warfarin per pharmacy  recommendations - continue Labetolol to 200 mg TID  Possible Obstructive Sleep Apnea:  Will need sleep study as an outpatient in order to obtain CPAP. - intermittently using CPAP - Discussion today on the importance of CPAP  ?Bipolar Disorder: No signs/symptoms of Bipolar.  Appreciate psych input.  He does not appear to require any antidepressants or mood stabilizers this time.  Psychiatry does not recognize any signs of BPD during this admission.  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  Type 2 Diabetes: No medications. Recent A1c of 5.7. CBGs have been stable, although has been refusing some CBGs. - CBGs BID only with SSI-S  HTN: stable.  - continue Labetolol 200 mg TID  Homelessness:  Significant barrier to care. Plan for now is to be discharged to SNF. - CSW following  Dispo: Disposition is deferred at this time, awaiting HD/CLIP process.  Anticipated discharge in approximately 1-2 days.  The patient does not have a current PCP (Provider Not In System) and does not need an [facility] hospital follow-up appointment after discharge. He needs to establish care at [facility]  The patient does have transportation limitations that hinder transportation to clinic appointments. He will need access to a HD center on discharge.  .Services Needed at time of discharge: Y = Yes, Blank = No PT: Yes  OT:   RN:   Equipment:   Other: Skilled Nursing    LOS: 10 days  Liberty Handy, MD 05/23/15, 4:21 pm

## 2015-09-20 ENCOUNTER — Emergency Department: Payer: Medicaid Other

## 2015-09-20 ENCOUNTER — Inpatient Hospital Stay
Admission: EM | Admit: 2015-09-20 | Discharge: 2015-09-22 | DRG: 304 | Disposition: A | Discharge status: Home / Self Care | Payer: Medicaid Other | Attending: Internal Medicine | Admitting: Internal Medicine

## 2015-09-20 ENCOUNTER — Encounter: Payer: Self-pay | Admitting: Medical Oncology

## 2015-09-20 DIAGNOSIS — R0602 Shortness of breath: Secondary | ICD-10-CM

## 2015-09-20 DIAGNOSIS — N2889 Other specified disorders of kidney and ureter: Secondary | ICD-10-CM

## 2015-09-20 DIAGNOSIS — E1122 Type 2 diabetes mellitus with diabetic chronic kidney disease: Secondary | ICD-10-CM | POA: Diagnosis present

## 2015-09-20 DIAGNOSIS — N400 Enlarged prostate without lower urinary tract symptoms: Secondary | ICD-10-CM | POA: Diagnosis present

## 2015-09-20 DIAGNOSIS — I4891 Unspecified atrial fibrillation: Secondary | ICD-10-CM | POA: Diagnosis present

## 2015-09-20 DIAGNOSIS — M545 Low back pain, unspecified: Secondary | ICD-10-CM

## 2015-09-20 DIAGNOSIS — Z79891 Long term (current) use of opiate analgesic: Secondary | ICD-10-CM

## 2015-09-20 DIAGNOSIS — D631 Anemia in chronic kidney disease: Secondary | ICD-10-CM | POA: Diagnosis present

## 2015-09-20 DIAGNOSIS — Z992 Dependence on renal dialysis: Secondary | ICD-10-CM

## 2015-09-20 DIAGNOSIS — H9190 Unspecified hearing loss, unspecified ear: Secondary | ICD-10-CM | POA: Diagnosis present

## 2015-09-20 DIAGNOSIS — F319 Bipolar disorder, unspecified: Secondary | ICD-10-CM | POA: Diagnosis present

## 2015-09-20 DIAGNOSIS — Z79899 Other long term (current) drug therapy: Secondary | ICD-10-CM

## 2015-09-20 DIAGNOSIS — Z9115 Patient's noncompliance with renal dialysis: Secondary | ICD-10-CM

## 2015-09-20 DIAGNOSIS — J45909 Unspecified asthma, uncomplicated: Secondary | ICD-10-CM | POA: Diagnosis present

## 2015-09-20 DIAGNOSIS — G8929 Other chronic pain: Secondary | ICD-10-CM | POA: Diagnosis present

## 2015-09-20 DIAGNOSIS — N186 End stage renal disease: Secondary | ICD-10-CM | POA: Diagnosis present

## 2015-09-20 DIAGNOSIS — I129 Hypertensive chronic kidney disease with stage 1 through stage 4 chronic kidney disease, or unspecified chronic kidney disease: Secondary | ICD-10-CM | POA: Diagnosis present

## 2015-09-20 DIAGNOSIS — I2510 Atherosclerotic heart disease of native coronary artery without angina pectoris: Secondary | ICD-10-CM | POA: Diagnosis present

## 2015-09-20 DIAGNOSIS — Z7982 Long term (current) use of aspirin: Secondary | ICD-10-CM

## 2015-09-20 DIAGNOSIS — N2581 Secondary hyperparathyroidism of renal origin: Secondary | ICD-10-CM | POA: Diagnosis present

## 2015-09-20 DIAGNOSIS — M1990 Unspecified osteoarthritis, unspecified site: Secondary | ICD-10-CM | POA: Diagnosis present

## 2015-09-20 DIAGNOSIS — I160 Hypertensive urgency: Principal | ICD-10-CM | POA: Diagnosis present

## 2015-09-20 DIAGNOSIS — G4733 Obstructive sleep apnea (adult) (pediatric): Secondary | ICD-10-CM | POA: Diagnosis present

## 2015-09-20 DIAGNOSIS — J449 Chronic obstructive pulmonary disease, unspecified: Secondary | ICD-10-CM | POA: Diagnosis present

## 2015-09-20 DIAGNOSIS — Z880 Allergy status to penicillin: Secondary | ICD-10-CM

## 2015-09-20 LAB — CBC
HCT: 33 % — ABNORMAL LOW (ref 40.0–52.0)
HEMOGLOBIN: 10.4 g/dL — AB (ref 13.0–18.0)
MCH: 26.2 pg (ref 26.0–34.0)
MCHC: 31.6 g/dL — ABNORMAL LOW (ref 32.0–36.0)
MCV: 83 fL (ref 80.0–100.0)
PLATELETS: 180 10*3/uL (ref 150–440)
RBC: 3.97 MIL/uL — AB (ref 4.40–5.90)
RDW: 18.9 % — ABNORMAL HIGH (ref 11.5–14.5)
WBC: 6.2 10*3/uL (ref 3.8–10.6)

## 2015-09-20 LAB — BASIC METABOLIC PANEL
ANION GAP: 12 (ref 5–15)
BUN: 90 mg/dL — ABNORMAL HIGH (ref 6–20)
CALCIUM: 8.6 mg/dL — AB (ref 8.9–10.3)
CO2: 21 mmol/L — AB (ref 22–32)
CREATININE: 11.54 mg/dL — AB (ref 0.61–1.24)
Chloride: 106 mmol/L (ref 101–111)
GFR, EST AFRICAN AMERICAN: 5 mL/min — AB (ref >60)
GFR, EST NON AFRICAN AMERICAN: 4 mL/min — AB (ref >60)
Glucose, Bld: 80 mg/dL (ref 65–99)
Potassium: 4.8 mmol/L (ref 3.5–5.1)
SODIUM: 139 mmol/L (ref 135–145)

## 2015-09-20 LAB — TROPONIN I: TROPONIN I: 0.03 ng/mL (ref <0.031)

## 2015-09-20 MED ORDER — ONDANSETRON HCL 4 MG PO TABS: 4.0000 mg | ORAL_TABLET | Freq: Four times a day (QID) | ORAL | Status: DC | PRN

## 2015-09-20 MED ORDER — SODIUM CHLORIDE 0.9 % IJ SOLN
3.0000 mL | Freq: Two times a day (BID) | INTRAMUSCULAR | Status: DC
  Administered 2015-09-21 – 2015-09-22 (×4): 3 mL via INTRAVENOUS

## 2015-09-20 MED ORDER — OXYCODONE-ACETAMINOPHEN 5-325 MG PO TABS: 1.0000 | ORAL_TABLET | Freq: Four times a day (QID) | ORAL | Status: DC | PRN

## 2015-09-20 MED ORDER — MORPHINE SULFATE (PF) 2 MG/ML IV SOLN
INTRAVENOUS | Status: AC
  Filled 2015-09-20: qty 1

## 2015-09-20 MED ORDER — MORPHINE SULFATE (PF) 4 MG/ML IV SOLN
6.0000 mg | Freq: Once | INTRAVENOUS | Status: AC
  Administered 2015-09-20: 6 mg via INTRAVENOUS
  Filled 2015-09-20: qty 1

## 2015-09-20 MED ORDER — IOHEXOL 240 MG/ML SOLN
50.0000 mL | INTRAMUSCULAR | Status: AC
  Administered 2015-09-20: 50 mL via ORAL

## 2015-09-20 MED ORDER — HEPARIN SODIUM (PORCINE) 5000 UNIT/ML IJ SOLN
5000.0000 [IU] | Freq: Three times a day (TID) | INTRAMUSCULAR | Status: DC
  Administered 2015-09-21 – 2015-09-22 (×4): 5000 [IU] via SUBCUTANEOUS
  Filled 2015-09-20 (×4): qty 1

## 2015-09-20 MED ORDER — ACETAMINOPHEN 650 MG RE SUPP
650.0000 mg | Freq: Four times a day (QID) | RECTAL | Status: DC | PRN
Start: 2015-09-20 — End: 2015-09-22

## 2015-09-20 MED ORDER — OXYCODONE HCL 5 MG PO TABS
5.0000 mg | ORAL_TABLET | ORAL | Status: DC | PRN
Start: 2015-09-20 — End: 2015-09-22

## 2015-09-20 MED ORDER — MORPHINE SULFATE (PF) 2 MG/ML IV SOLN
2.0000 mg | INTRAVENOUS | Status: DC | PRN
Start: 2015-09-20 — End: 2015-09-22
  Administered 2015-09-21: 2 mg via INTRAVENOUS
  Filled 2015-09-20: qty 1

## 2015-09-20 MED ORDER — ONDANSETRON HCL 4 MG/2ML IJ SOLN
4.0000 mg | Freq: Four times a day (QID) | INTRAMUSCULAR | Status: DC | PRN
Start: 2015-09-20 — End: 2015-09-22

## 2015-09-20 MED ORDER — ACETAMINOPHEN 325 MG PO TABS: 650.0000 mg | ORAL_TABLET | Freq: Four times a day (QID) | ORAL | Status: DC | PRN

## 2015-09-20 NOTE — ED Notes (Addendum)
Pt's representative from group home would like to be called for admit/discharge and any pertinent info.  Dellis Anes [phone] cell.  Or [phone] home

## 2015-09-20 NOTE — ED Notes (Signed)
Pt was gotten up to go to the bathroom by tech.  Pt got dizzy before he made it to the restroom.  Pt was put back in bed. MD notified
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
## 2015-09-20 NOTE — H&P (Signed)
[facility] [place] NAME: Noah Lewis    MR#:  [id number]  DATE OF BIRTH:  03-16-1951   DATE OF ADMISSION:  09/20/2015  PRIMARY CARE PHYSICIAN: PROVIDER NOT IN SYSTEM   REQUESTING/REFERRING PHYSICIAN: mcShane  CHIEF COMPLAINT:   Chief Complaint  Patient presents with  . Back Pain    HISTORY OF PRESENT ILLNESS:  Noah Lewis  is a 64 y.o. male with a known history of lumbago, end-stage renal disease on hemodialysis Tuesday Thursday Saturday who is presenting with back pain. He is presenting with acute on chronic back pain, lower back, described only as "pain", nonradiating worse with some movements no relieving factors once again this is a chronic symptom of his however and his group facility he is only able to take topical agents. Of note he has missed dialysis sessions 2 secondary to his back pain. He has received 1 dose of morphine and his pain has all but resolved in the emergency department however, he is hypertensive as well as desaturated to the high 80s.  PAST MEDICAL HISTORY:   Past Medical History  Diagnosis Date  . Cancer ([place])   . Arthritis   . Renal disorder   . Hypertension   . Diabetes mellitus without complication ([place])   . Asthma   . COPD (chronic obstructive pulmonary disease) ([place])   . Coronary artery disease     PAST SURGICAL HISTORY:   Past Surgical History  Procedure Laterality Date  . No past surgeries    . Insertion of dialysis catheter    . Insertion of dialysis catheter N/A 05/29/2015    Procedure: INSERTION OF DIALYSIS CATHETER;  Surgeon: Angelia Mould, MD;  Location: [facility];  Service: Vascular;  Laterality: N/A;    SOCIAL HISTORY:   Social History  Substance Use Topics  . Smoking status: Never Smoker   . Smokeless tobacco: Never Used  . Alcohol Use: No    FAMILY HISTORY:   Family History  Problem Relation Age of Onset  . Hypertension Other   . Diabetes Other      DRUG ALLERGIES:   Allergies  Allergen Reactions  . Penicillins Other (See Comments)    Reaction:  Unknown     REVIEW OF SYSTEMS:  REVIEW OF SYSTEMS:  CONSTITUTIONAL: Denies fevers, chills, fatigue, weakness.  EYES: Denies blurred vision, double vision, or eye pain.  EARS, NOSE, THROAT: Denies tinnitus, ear pain, hearing loss.  RESPIRATORY: denies cough, shortness of breath, wheezing  CARDIOVASCULAR: Denies chest pain, palpitations, edema.  GASTROINTESTINAL: Denies nausea, vomiting, diarrhea, abdominal pain.  GENITOURINARY: Denies dysuria, hematuria.  ENDOCRINE: Denies nocturia or thyroid problems. HEMATOLOGIC AND LYMPHATIC: Denies easy bruising or bleeding.  SKIN: Denies rash or lesions.  MUSCULOSKELETAL: Denies pain in neck, shoulder, knees, hips, or further arthritic symptoms. positive back pain  NEUROLOGIC: Denies paralysis, paresthesias.  PSYCHIATRIC: Denies anxiety or depressive symptoms. Otherwise full review of systems performed by me is negative.   MEDICATIONS AT HOME:   Prior to Admission medications   Medication Sig Start Date End Date Taking? Authorizing Provider  acetaminophen (TYLENOL) 325 MG tablet Take 1 tablet (325 mg total) by mouth every 6 (six) hours as needed for moderate pain. 05/23/15   Liberty Handy, MD  aspirin 81 MG chewable tablet Chew 1 tablet (81 mg total) by mouth daily. 06/02/15   Maryellen Pile, MD  calcitRIOL (ROCALTROL) 0.5 MCG capsule Take 2 capsules (1 mcg total) by mouth Every Tuesday,Thursday,and Saturday  with dialysis. 06/02/15   Maryellen Pile, MD  calcium acetate (PHOSLO) 667 MG capsule Take 3 capsules (2,001 mg total) by mouth 3 (three) times daily with meals. 05/23/15   Liberty Handy, MD  capsaicin (ZOSTRIX) 0.025 % cream Apply topically 2 (two) times daily as needed (low back pain). 05/23/15   Liberty Handy, MD  Darbepoetin Alfa (ARANESP) 25 MCG/0.42ML SOSY injection Inject 0.42 mLs (25 mcg total) into the vein every Thursday with hemodialysis.  06/02/15   Maryellen Pile, MD  ipratropium-albuterol (DUONEB) 0.5-2.5 (3) MG/3ML SOLN Take 3 mLs by nebulization every 6 (six) hours as needed. 05/23/15   Liberty Handy, MD  labetalol (NORMODYNE) 200 MG tablet Take 1 tablet (200 mg total) by mouth 3 (three) times daily. 05/24/15   Gerlene Fee, NP  loratadine (CLARITIN) 10 MG tablet Take 1 tablet (10 mg total) by mouth daily. 05/24/15   Gerlene Fee, NP  oxyCODONE-acetaminophen (ROXICET) 5-325 MG tablet Take 1 tablet by mouth every 6 (six) hours as needed. 09/20/15   Schuyler Amor, MD  polyethylene glycol (MIRALAX / GLYCOLAX) packet Take 17 g by mouth daily as needed for mild constipation. 05/23/15   Liberty Handy, MD      VITAL SIGNS:  Blood pressure 172/124, pulse 90, temperature 97.7 F (36.5 C), temperature source Oral, resp. rate 17, height 5\' 7"  (1.702 m), weight 141 lb (63.957 kg), SpO2 95 %.  PHYSICAL EXAMINATION:  VITAL SIGNS: Filed Vitals:   09/20/15 2100 09/20/15 2108  BP:    Pulse: 86 90  Temp:  97.7 F (36.5 C)  Resp: 15 17   GENERAL:64 y.o.male currently in no acute distress.  sleeping though easily arousable HEAD: Normocephalic, atraumatic.  EYES: Pupils equal, round, reactive to light. Extraocular muscles intact. No scleral icterus.  MOUTH: Moist mucosal membrane. Dentition intact. No abscess noted.  EAR, NOSE, THROAT: Clear without exudates. No external lesions.  NECK: Supple. No thyromegaly. No nodules. No JVD.  PULMONARY: Clear to ascultation, without wheeze rails or rhonci. No use of accessory muscles, Good respiratory effort. good air entry bilaterally CHEST: Nontender to palpation.  CARDIOVASCULAR: S1 and S2. Regular rate and rhythm. No murmurs, rubs, or gallops.  Trace edema. Pedal pulses 2+ bilaterally.  GASTROINTESTINAL: Soft, nontender, nondistended. No masses. Positive bowel sounds. No hepatosplenomegaly.  MUSCULOSKELETAL: No swelling, clubbing, or edema. Range of motion full in all extremities.  NEUROLOGIC:  Cranial nerves II through XII are intact. No gross focal neurological deficits. Sensation intact. Reflexes intact.  SKIN: No ulceration, lesions, rashes, or cyanosis. Skin warm and dry. Turgor intact.  PSYCHIATRIC: Mood, affect within normal limits. The patient is awake, alert and oriented x 3. Insight, judgment intact.    LABORATORY PANEL:   CBC  Recent Labs Lab 09/20/15 1348  WBC 6.2  HGB 10.4*  HCT 33.0*  PLT 180   ------------------------------------------------------------------------------------------------------------------  Chemistries   Recent Labs Lab 09/20/15 1348  NA 139  K 4.8  CL 106  CO2 21*  GLUCOSE 80  BUN 90*  CREATININE 11.54*  CALCIUM 8.6*   ------------------------------------------------------------------------------------------------------------------  Cardiac Enzymes  Recent Labs Lab 09/20/15 1348  TROPONINI 0.03   ------------------------------------------------------------------------------------------------------------------  RADIOLOGY:  Ct Abdomen Pelvis Wo Contrast  09/20/2015  CLINICAL DATA:  Low back pain.  Hemodialysis. EXAM: CT ABDOMEN AND PELVIS WITHOUT CONTRAST TECHNIQUE: Multidetector CT imaging of the abdomen and pelvis was performed following the standard protocol without IV contrast. COMPARISON:  11/19/2013 CT abdomen/pelvis. FINDINGS: Study is motion degraded. Lower chest: Diffuse ground-glass opacity in  the visualized bilateral lung bases. Stable 4 mm solid left lower lobe pulmonary nodule (series 4/image 7). Stable tiny calcified granulomas in the right lower lobe. Mild cardiomegaly, increased. Trace bilateral pleural effusions. Right common femoral approach central venous catheter terminates over the right atrium on the scout topogram. Hepatobiliary: Normal liver with no liver mass. Normal gallbladder with no radiopaque cholelithiasis. No biliary ductal dilatation. Pancreas: Normal, with no mass or duct dilation. Spleen: Normal  size. No mass. Adrenals/Urinary Tract: Normal adrenals. No hydronephrosis. Symmetric bilateral renal atrophy. There is an exophytic 1.3 cm renal mass in the posterior interpolar right kidney (series 2/ image 23), which is new since 11/19/2013 CT and demonstrates indeterminate unenhanced CT density of 32 HU. No additional contour deforming renal masses. No nephrolithiasis. Normal caliber ureters, with no ureteral stones. Mild diffuse bladder wall thickening, decreased since 11/19/2013. Stomach/Bowel: Grossly normal stomach. Normal caliber small bowel with no small bowel wall thickening. Normal appendix. Normal large bowel with no diverticulosis, large bowel wall thickening or pericolonic fat stranding. Vascular/Lymphatic: Normal caliber abdominal aorta. No pathologically enlarged lymph nodes in the abdomen or pelvis. Reproductive: Mild prostatomegaly, decreased since 11/19/2013. Other: No pneumoperitoneum, ascites or focal fluid collection. Musculoskeletal: No aggressive appearing focal osseous lesions. Stable non expansile lytic lesion in the left iliac wing (series 2/ image 53) without aggressive imaging features. 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 Moderate degenerative changes in the visualized thoracolumbar spine, most prominent at L5-S1. IMPRESSION: 1. Diffuse ground-glass opacity at the visualized lung bases. Increased mild cardiomegaly. Trace bilateral pleural effusions. Findings suggest mild congestive heart failure/fluid overload. 2. New indeterminate exophytic 1.3 cm right renal mass, renal neoplasm not excluded. Given that the patient is on hemodialysis, recommend attempted characterization of this renal mass on a renal ultrasound. If the mass remains indeterminate on renal ultrasound, consider further evaluation with CT of the abdomen with and without intravenous contrast coordinated with hemodialysis schedule. 3. Mild prostatomegaly, decreased. Mild diffuse bladder wall  thickening, decreased, suggestive of chronic bladder outlet obstruction by the enlarged prostate. Recommend correlation with urinalysis. No hydronephrosis. 4. 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 Electronically Signed   By: Ilona Sorrel M.D.   On: 09/20/2015 19:06   Dg Chest Port 1 View  09/20/2015  CLINICAL DATA:  Low back pain and shortness of Breath EXAM: PORTABLE CHEST - 1 VIEW COMPARISON:  05/30/2015 FINDINGS: Cardiac shadow is enlarged but stable. The lungs are well aerated bilaterally. No focal confluent infiltrate is seen. No bony abnormality is noted. IMPRESSION: No active disease. Electronically Signed   By: Inez Catalina M.D.   On: 09/20/2015 21:47    EKG:   Orders placed or performed during the hospital encounter of 09/20/15  . ED EKG within 10 minutes  . ED EKG within 10 minutes  . EKG 12-Lead  . EKG 12-Lead    IMPRESSION AND PLAN:   74 year African-American gentleman history of end-stage renal disease on hemodialysis presenting with back pain  1. Hypertensive urgency: Secondary to multiple missed dialysis sessions will add when necessary hydralazine and continue with home medications 2. End-stage renal disease on hemodialysis: Consult nephrology for continuation/restart hemodialysis 3. Chronic low back pain: Back to baseline/improved 4. Venous thromboembolism prophylactic: Heparin subcutaneous    All the records are reviewed and case discussed with ED provider. Management plans discussed with the patient, family and they are in agreement.  CODE STATUS: full  TOTAL TIME TAKING CARE OF THIS  PATIENT: 35 minutes.    Hower,  Karenann Cai.D on 09/20/2015 at 10:57 PM  Between 7am to 6pm - Pager - [phone]  After 6pm: House Pager: - [phone]  [facility]  Office  [phone]  CC: Primary care physician; [place]

## 2015-09-20 NOTE — ED Notes (Signed)
Pt brought in by caregiver from the group home.  Pt states he has back pain..  Pt has missed his last 2 dialysis treatments because pt refused.  No injury to back today.  Pt placed on monitor.

## 2015-09-20 NOTE — ED Notes (Signed)
Pt here with caregiver who reports pt has been c/o lower back pain for a few days and since the pain began pt has been refusing to go to dialysis. Has missed 2 treaments.

## 2015-09-20 NOTE — ED Notes (Signed)
Patient transported to CT 

## 2015-09-21 DIAGNOSIS — I4891 Unspecified atrial fibrillation: Secondary | ICD-10-CM | POA: Diagnosis present

## 2015-09-21 DIAGNOSIS — D631 Anemia in chronic kidney disease: Secondary | ICD-10-CM | POA: Diagnosis present

## 2015-09-21 DIAGNOSIS — N400 Enlarged prostate without lower urinary tract symptoms: Secondary | ICD-10-CM | POA: Diagnosis present

## 2015-09-21 DIAGNOSIS — H9190 Unspecified hearing loss, unspecified ear: Secondary | ICD-10-CM | POA: Diagnosis present

## 2015-09-21 DIAGNOSIS — J449 Chronic obstructive pulmonary disease, unspecified: Secondary | ICD-10-CM | POA: Diagnosis present

## 2015-09-21 DIAGNOSIS — M545 Low back pain: Secondary | ICD-10-CM | POA: Diagnosis present

## 2015-09-21 DIAGNOSIS — G8929 Other chronic pain: Secondary | ICD-10-CM | POA: Diagnosis present

## 2015-09-21 DIAGNOSIS — E1122 Type 2 diabetes mellitus with diabetic chronic kidney disease: Secondary | ICD-10-CM | POA: Diagnosis present

## 2015-09-21 DIAGNOSIS — Z7982 Long term (current) use of aspirin: Secondary | ICD-10-CM | POA: Diagnosis not present

## 2015-09-21 DIAGNOSIS — I160 Hypertensive urgency: Secondary | ICD-10-CM | POA: Diagnosis present

## 2015-09-21 DIAGNOSIS — I2510 Atherosclerotic heart disease of native coronary artery without angina pectoris: Secondary | ICD-10-CM | POA: Diagnosis present

## 2015-09-21 DIAGNOSIS — Z79899 Other long term (current) drug therapy: Secondary | ICD-10-CM | POA: Diagnosis not present

## 2015-09-21 DIAGNOSIS — R0602 Shortness of breath: Secondary | ICD-10-CM | POA: Diagnosis present

## 2015-09-21 DIAGNOSIS — I129 Hypertensive chronic kidney disease with stage 1 through stage 4 chronic kidney disease, or unspecified chronic kidney disease: Secondary | ICD-10-CM | POA: Diagnosis present

## 2015-09-21 DIAGNOSIS — Z9115 Patient's noncompliance with renal dialysis: Secondary | ICD-10-CM | POA: Diagnosis not present

## 2015-09-21 DIAGNOSIS — M1990 Unspecified osteoarthritis, unspecified site: Secondary | ICD-10-CM | POA: Diagnosis present

## 2015-09-21 DIAGNOSIS — G4733 Obstructive sleep apnea (adult) (pediatric): Secondary | ICD-10-CM | POA: Diagnosis present

## 2015-09-21 DIAGNOSIS — Z79891 Long term (current) use of opiate analgesic: Secondary | ICD-10-CM | POA: Diagnosis not present

## 2015-09-21 DIAGNOSIS — Z992 Dependence on renal dialysis: Secondary | ICD-10-CM | POA: Diagnosis not present

## 2015-09-21 DIAGNOSIS — N186 End stage renal disease: Secondary | ICD-10-CM | POA: Diagnosis present

## 2015-09-21 DIAGNOSIS — J45909 Unspecified asthma, uncomplicated: Secondary | ICD-10-CM | POA: Diagnosis present

## 2015-09-21 DIAGNOSIS — Z880 Allergy status to penicillin: Secondary | ICD-10-CM | POA: Diagnosis not present

## 2015-09-21 DIAGNOSIS — F319 Bipolar disorder, unspecified: Secondary | ICD-10-CM | POA: Diagnosis present

## 2015-09-21 DIAGNOSIS — N2581 Secondary hyperparathyroidism of renal origin: Secondary | ICD-10-CM | POA: Diagnosis present

## 2015-09-21 LAB — MRSA PCR SCREENING: MRSA by PCR: NEGATIVE

## 2015-09-21 LAB — PHOSPHORUS: Phosphorus: 8.4 mg/dL — ABNORMAL HIGH (ref 2.5–4.6)

## 2015-09-21 MED ORDER — SODIUM CHLORIDE 0.9 % IV SOLN: 100.0000 mL | INTRAVENOUS | Status: DC | PRN

## 2015-09-21 MED ORDER — LIDOCAINE HCL (PF) 1 % IJ SOLN
5.0000 mL | INTRAMUSCULAR | Status: DC | PRN
  Filled 2015-09-21: qty 5

## 2015-09-21 MED ORDER — CALCITRIOL 0.25 MCG PO CAPS
1.0000 ug | ORAL_CAPSULE | ORAL | Status: DC
  Filled 2015-09-21: qty 4

## 2015-09-21 MED ORDER — LORATADINE 10 MG PO TABS
10.0000 mg | ORAL_TABLET | Freq: Every day | ORAL | Status: DC
  Administered 2015-09-21 – 2015-09-22 (×2): 10 mg via ORAL
  Filled 2015-09-21 (×2): qty 1

## 2015-09-21 MED ORDER — CALCIUM ACETATE (PHOS BINDER) 667 MG PO CAPS
2001.0000 mg | ORAL_CAPSULE | Freq: Three times a day (TID) | ORAL | Status: DC
  Administered 2015-09-21 – 2015-09-22 (×2): 2001 mg via ORAL
  Filled 2015-09-21 (×5): qty 3

## 2015-09-21 MED ORDER — ASPIRIN 81 MG PO CHEW
81.0000 mg | CHEWABLE_TABLET | Freq: Every day | ORAL | Status: DC
  Administered 2015-09-21 – 2015-09-22 (×2): 81 mg via ORAL
  Filled 2015-09-21 (×2): qty 1

## 2015-09-21 MED ORDER — METOPROLOL TARTRATE 1 MG/ML IV SOLN
5.0000 mg | Freq: Once | INTRAVENOUS | Status: AC
  Administered 2015-09-21: 5 mg via INTRAVENOUS
  Filled 2015-09-21: qty 5

## 2015-09-21 MED ORDER — LABETALOL HCL 5 MG/ML IV SOLN
10.0000 mg | Freq: Once | INTRAVENOUS | Status: AC
  Administered 2015-09-21: 10 mg via INTRAVENOUS
  Filled 2015-09-21: qty 4

## 2015-09-21 MED ORDER — LIDOCAINE-PRILOCAINE 2.5-2.5 % EX CREA: 1.0000 "application " | TOPICAL_CREAM | CUTANEOUS | Status: DC | PRN

## 2015-09-21 MED ORDER — LABETALOL HCL 200 MG PO TABS
200.0000 mg | ORAL_TABLET | Freq: Three times a day (TID) | ORAL | Status: DC
  Administered 2015-09-21 – 2015-09-22 (×5): 200 mg via ORAL
  Filled 2015-09-21 (×5): qty 1

## 2015-09-21 MED ORDER — PENTAFLUOROPROP-TETRAFLUOROETH EX AERO
1.0000 "application " | INHALATION_SPRAY | CUTANEOUS | Status: DC | PRN
  Filled 2015-09-21: qty 30

## 2015-09-21 MED ORDER — CAPSAICIN 0.025 % EX CREA
TOPICAL_CREAM | Freq: Two times a day (BID) | CUTANEOUS | Status: DC | PRN
  Filled 2015-09-21: qty 56.6

## 2015-09-21 MED ORDER — POLYETHYLENE GLYCOL 3350 17 G PO PACK: 17.0000 g | PACK | Freq: Every day | ORAL | Status: DC | PRN

## 2015-09-21 MED ORDER — ALTEPLASE 2 MG IJ SOLR: 2.0000 mg | Freq: Once | INTRAMUSCULAR | Status: DC | PRN

## 2015-09-21 MED ORDER — IPRATROPIUM-ALBUTEROL 0.5-2.5 (3) MG/3ML IN SOLN: 3.0000 mL | RESPIRATORY_TRACT | Status: DC | PRN

## 2015-09-21 MED ORDER — HEPARIN SODIUM (PORCINE) 1000 UNIT/ML DIALYSIS
1000.0000 [IU] | INTRAMUSCULAR | Status: DC | PRN
  Filled 2015-09-21: qty 1

## 2015-09-21 MED ORDER — ENALAPRIL MALEATE 10 MG PO TABS
10.0000 mg | ORAL_TABLET | Freq: Every day | ORAL | Status: DC
Start: 2015-09-21 — End: 2015-09-22
  Administered 2015-09-21 – 2015-09-22 (×2): 10 mg via ORAL
  Filled 2015-09-21 (×2): qty 1

## 2015-09-21 NOTE — Progress Notes (Signed)
Patient was admitted to the unit from ED via stretcher. Patient was on 2L of supplemental oxygen on admission. He denied pain, and N&V. Patient skin was team checked with [facility], patient has a small skin tear on his sacrum, foam dressing was applied. Patient bilateral upper extremities were significantly swollen with L>R. Patient is HOH, stated he is tired and could not complete the admission documentation.  Patient received one time dose for elevated BP in the [id number] systolic. Patient systolic BP came down to 0000000 systolic. He remained on 2-3L of supplemental oxygen.

## 2015-09-21 NOTE — Care Management Note (Signed)
Patient is active at [facility] on TTS 2nd shift 11a.m.  I have updated them on his admission and will send medical records at discharge.  Iran Sizer  Dialysis Liaison  [phone]

## 2015-09-21 NOTE — Progress Notes (Signed)
[facility] at [place] NAME: Noah Lewis    MR#:  [id number]  DATE OF BIRTH:  07/24/1951  SUBJECTIVE:  CHIEF COMPLAINT:  Pt is very lethargic today, arousable to verbal commands   REVIEW OF SYSTEMS:  CONSTITUTIONAL: No fever, fatigue or weakness.  EYES: No blurred or double vision.  EARS, NOSE, AND THROAT: No tinnitus or ear pain.  RESPIRATORY: No cough, shortness of breath, wheezing or hemoptysis.  CARDIOVASCULAR: No chest pain, orthopnea, edema.  GASTROINTESTINAL: No nausea, vomiting, diarrhea or abdominal pain.  GENITOURINARY: No dysuria, hematuria.  ENDOCRINE: No polyuria, nocturia,  HEMATOLOGY: No anemia, easy bruising or bleeding SKIN: No rash or lesion. MUSCULOSKELETAL: No joint pain or arthritis.   NEUROLOGIC: No tingling, numbness, weakness.  PSYCHIATRY: No anxiety or depression.   DRUG ALLERGIES:   Allergies  Allergen Reactions  . Penicillins Other (See Comments)    Reaction:  Unknown     VITALS:  Blood pressure 155/92, pulse 87, temperature 97.5 F (36.4 C), temperature source Oral, resp. rate 18, height 5\' 7"  (1.702 m), weight 79.062 kg (174 lb 4.8 oz), SpO2 96 %.  PHYSICAL EXAMINATION:  GENERAL:  64 y.o.-year-old patient lying in the bed with no acute distress.  EYES: Pupils equal, round, reactive to light and accommodation. No scleral icterus. Extraocular muscles intact.  HEENT: Head atraumatic, normocephalic. Oropharynx and nasopharynx clear.  NECK:  Supple, no jugular venous distention. No thyroid enlargement, no tenderness.  LUNGS: Normal breath sounds bilaterally, no wheezing, rales,rhonchi or crepitation. No use of accessory muscles of respiration.  CARDIOVASCULAR: S1, S2 normal. No murmurs, rubs, or gallops.  ABDOMEN: Soft, nontender, nondistended. Bowel sounds present. No organomegaly or mass.  EXTREMITIES: No pedal edema, cyanosis, or clubbing.  NEUROLOGIC: Cranial nerves II through XII are  intact. Muscle strength 5/5 in all extremities. Sensation intact. Gait not checked.  PSYCHIATRIC: The patient is alert and oriented x 3.  SKIN: No obvious rash, lesion, or ulcer.    LABORATORY PANEL:   CBC  Recent Labs Lab 09/20/15 1348  WBC 6.2  HGB 10.4*  HCT 33.0*  PLT 180   ------------------------------------------------------------------------------------------------------------------  Chemistries   Recent Labs Lab 09/20/15 1348  NA 139  K 4.8  CL 106  CO2 21*  GLUCOSE 80  BUN 90*  CREATININE 11.54*  CALCIUM 8.6*   ------------------------------------------------------------------------------------------------------------------  Cardiac Enzymes  Recent Labs Lab 09/20/15 1348  TROPONINI 0.03   ------------------------------------------------------------------------------------------------------------------  RADIOLOGY:  Ct Abdomen Pelvis Wo Contrast  09/20/2015  CLINICAL DATA:  Low back pain.  Hemodialysis. EXAM: CT ABDOMEN AND PELVIS WITHOUT CONTRAST TECHNIQUE: Multidetector CT imaging of the abdomen and pelvis was performed following the standard protocol without IV contrast. COMPARISON:  11/19/2013 CT abdomen/pelvis. FINDINGS: Study is motion degraded. Lower chest: Diffuse ground-glass opacity in the visualized bilateral lung bases. Stable 4 mm solid left lower lobe pulmonary nodule (series 4/image 7). Stable tiny calcified granulomas in the right lower lobe. Mild cardiomegaly, increased. Trace bilateral pleural effusions. Right common femoral approach central venous catheter terminates over the right atrium on the scout topogram. Hepatobiliary: Normal liver with no liver mass. Normal gallbladder with no radiopaque cholelithiasis. No biliary ductal dilatation. Pancreas: Normal, with no mass or duct dilation. Spleen: Normal size. No mass. Adrenals/Urinary Tract: Normal adrenals. No hydronephrosis. Symmetric bilateral renal atrophy. There is an exophytic 1.3 cm  renal mass in the posterior interpolar right kidney (series 2/ image 23), which is new since 11/19/2013 CT and demonstrates indeterminate unenhanced CT density  of 32 HU. No additional contour deforming renal masses. No nephrolithiasis. Normal caliber ureters, with no ureteral stones. Mild diffuse bladder wall thickening, decreased since 11/19/2013. Stomach/Bowel: Grossly normal stomach. Normal caliber small bowel with no small bowel wall thickening. Normal appendix. Normal large bowel with no diverticulosis, large bowel wall thickening or pericolonic fat stranding. Vascular/Lymphatic: Normal caliber abdominal aorta. No pathologically enlarged lymph nodes in the abdomen or pelvis. Reproductive: Mild prostatomegaly, decreased since 11/19/2013. Other: No pneumoperitoneum, ascites or focal fluid collection. Musculoskeletal: No aggressive appearing focal osseous lesions. Stable non expansile lytic lesion in the left iliac wing (series 2/ image 53) without aggressive imaging features. 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 Moderate degenerative changes in the visualized thoracolumbar spine, most prominent at L5-S1. IMPRESSION: 1. Diffuse ground-glass opacity at the visualized lung bases. Increased mild cardiomegaly. Trace bilateral pleural effusions. Findings suggest mild congestive heart failure/fluid overload. 2. New indeterminate exophytic 1.3 cm right renal mass, renal neoplasm not excluded. Given that the patient is on hemodialysis, recommend attempted characterization of this renal mass on a renal ultrasound. If the mass remains indeterminate on renal ultrasound, consider further evaluation with CT of the abdomen with and without intravenous contrast coordinated with hemodialysis schedule. 3. Mild prostatomegaly, decreased. Mild diffuse bladder wall thickening, decreased, suggestive of chronic bladder outlet obstruction by the enlarged prostate. Recommend correlation with urinalysis. No  hydronephrosis. 4. 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 Electronically Signed   By: Ilona Sorrel M.D.   On: 09/20/2015 19:06   Dg Chest Port 1 View  09/20/2015  CLINICAL DATA:  Low back pain and shortness of Breath EXAM: PORTABLE CHEST - 1 VIEW COMPARISON:  05/30/2015 FINDINGS: Cardiac shadow is enlarged but stable. The lungs are well aerated bilaterally. No focal confluent infiltrate is seen. No bony abnormality is noted. IMPRESSION: No active disease. Electronically Signed   By: Inez Catalina M.D.   On: 09/20/2015 21:47    EKG:   Orders placed or performed during the hospital encounter of 09/20/15  . ED EKG within 10 minutes  . ED EKG within 10 minutes  . EKG 12-Lead  . EKG 12-Lead    ASSESSMENT AND PLAN:   81 year African-American gentleman history of end-stage renal disease on hemodialysis presenting with back pain  # . Hypertensive urgency: Secondary to multiple missed dialysis  Labetolol  vasotec added to the regimen  # Renal mass - renal u/s   # . End-stage renal disease on hemodialysis: Consult nephrology for continuation/restart hemodialysis  3. Chronic low back pain: Back to baseline/improved  4. Venous thromboembolism prophylactic: Heparin subcutaneous      All the records are reviewed and case discussed with Care Management/Social Workerr. Management plans discussed with the patient, family and they are in agreement.  CODE STATUS: fc   TOTAL TIME TAKING CARE OF THIS PATIENT: 35  minutes.   POSSIBLE D/C IN 1-2 DAYS, DEPENDING ON CLINICAL CONDITION.   Nicholes Mango M.D on 09/21/2015 at 6:57 PM  Between 7am to 6pm - Pager - [phone] After 6pm go to [email or web address] - password [email or web address] [facility]  Office  [phone]  CC: Primary care physician; PROVIDER NOT IN SYSTEM

## 2015-09-21 NOTE — Progress Notes (Signed)
Pre-hd tx 

## 2015-09-21 NOTE — Progress Notes (Signed)
Bp still elevated, IV lopressor ordered per Dr. Margaretmary Eddy.  Noah Lewis

## 2015-09-21 NOTE — Consult Note (Signed)
[facility] CONSULT NOTE    Date: 09/21/2015                  Patient Name:  Noah Lewis  MRN: [id number]  DOB: 1951/07/03  Age / Sex: 64 y.o., male         PCP: PROVIDER NOT IN SYSTEM                 Service Requesting Consult: Dr. Lavetta Nielsen                 Reason for Consult: Management of ESRD            History of Present Illness: Patient is a 64 y.o. male with a PMHx of osteoarthritis, end-stage renal disease on hemodialysis in [place], hypertension, diabetes mellitus, COPD, coronary artery disease,, who was admitted to [name] on 09/20/2015 for evaluation of back pain.   Patient was very lethargic during our interview and could not provide any details regarding the history of present illness.  It appears that he presented with tach pain to the emergency department.  He apparently resides at a group facility. It also appears that he missed 2 dialysis sessions recently.  He received one dose of morphine in the emergency department.  He was found to be hypertensive and also had oxygen saturation into the 80s in the emergency department.  This morning he was noted as having an episode of emesis.  Patient has a hemanalysis access in his left upper extremity.   Medications: Outpatient medications: Prescriptions prior to admission  Medication Sig Dispense Refill Last Dose  . acetaminophen (TYLENOL) 325 MG tablet Take 1 tablet (325 mg total) by mouth every 6 (six) hours as needed for moderate pain. 50 tablet 1 Taking  . aspirin 81 MG chewable tablet Chew 1 tablet (81 mg total) by mouth daily. 30 tablet 11 Taking  . calcitRIOL (ROCALTROL) 0.5 MCG capsule Take 2 capsules (1 mcg total) by mouth Every Tuesday,Thursday,and Saturday with dialysis. 30 capsule 0 Taking  . calcium acetate (PHOSLO) 667 MG capsule Take 3 capsules (2,001 mg total) by mouth 3 (three) times daily with meals. 270 capsule 2 Taking  . capsaicin (ZOSTRIX) 0.025 % cream Apply  topically 2 (two) times daily as needed (low back pain). 60 g 0 Taking  . Darbepoetin Alfa (ARANESP) 25 MCG/0.42ML SOSY injection Inject 0.42 mLs (25 mcg total) into the vein every Thursday with hemodialysis. 14.28 mL 0 Taking  . ipratropium-albuterol (DUONEB) 0.5-2.5 (3) MG/3ML SOLN Take 3 mLs by nebulization every 6 (six) hours as needed. 360 mL 2 Taking  . labetalol (NORMODYNE) 200 MG tablet Take 1 tablet (200 mg total) by mouth 3 (three) times daily. 90 tablet 0 Taking  . loratadine (CLARITIN) 10 MG tablet Take 1 tablet (10 mg total) by mouth daily. 30 tablet 11 Taking  . polyethylene glycol (MIRALAX / GLYCOLAX) packet Take 17 g by mouth daily as needed for mild constipation. 14 each 0 Taking    Current medications: Current Facility-Administered Medications  Medication Dose Route Frequency Provider Last Rate Last Dose  . 0.9 %  sodium chloride infusion  100 mL Intravenous PRN Shanyla Marconi, MD      . 0.9 %  sodium chloride infusion  100 mL Intravenous PRN Glenwood Revoir, MD      . acetaminophen (TYLENOL) tablet 650 mg  650 mg Oral Q6H PRN Lytle Butte, MD       Or  . acetaminophen (TYLENOL) suppository  650 mg  650 mg Rectal Q6H PRN Lytle Butte, MD      . alteplase (CATHFLO ACTIVASE) injection 2 mg  2 mg Intracatheter Once PRN Xayvier Vallez, MD      . aspirin chewable tablet 81 mg  81 mg Oral Daily Lytle Butte, MD   81 mg at 09/21/15 0837  . calcitRIOL (ROCALTROL) capsule 1 mcg  1 mcg Oral Q T,Th,Sa-HD Lytle Butte, MD   1 mcg at 09/21/15 1151  . calcium acetate (PHOSLO) capsule 2,001 mg  2,001 mg Oral TID WC Lytle Butte, MD   2,001 mg at 09/21/15 0836  . capsaicin (ZOSTRIX) 0.025 % cream   Topical BID PRN Lytle Butte, MD      . heparin injection 1,000 Units  1,000 Units Dialysis PRN Wilma Michaelson, MD      . heparin injection 5,000 Units  5,000 Units Subcutaneous 3 times per day Lytle Butte, MD   5,000 Units at 09/21/15 0054  . ipratropium-albuterol (DUONEB) 0.5-2.5 (3)  MG/3ML nebulizer solution 3 mL  3 mL Nebulization Q4H PRN Lytle Butte, MD      . labetalol (NORMODYNE) tablet 200 mg  200 mg Oral TID Lytle Butte, MD   200 mg at 09/21/15 [id number]  . lidocaine (PF) (XYLOCAINE) 1 % injection 5 mL  5 mL Intradermal PRN Jamorion Gomillion, MD      . lidocaine-prilocaine (EMLA) cream 1 application  1 application Topical PRN Kandise Riehle, MD      . loratadine (CLARITIN) tablet 10 mg  10 mg Oral Daily Lytle Butte, MD   10 mg at 09/21/15 0837  . morphine 2 MG/ML injection 2 mg  2 mg Intravenous Q4H PRN Lytle Butte, MD   2 mg at 09/21/15 0525  . ondansetron (ZOFRAN) tablet 4 mg  4 mg Oral Q6H PRN Lytle Butte, MD       Or  . ondansetron [facility]) injection 4 mg  4 mg Intravenous Q6H PRN Lytle Butte, MD      . oxyCODONE (Oxy IR/ROXICODONE) immediate release tablet 5 mg  5 mg Oral Q4H PRN Lytle Butte, MD      . pentafluoroprop-tetrafluoroeth (GEBAUERS) aerosol 1 application  1 application Topical PRN Ryson Bacha, MD      . polyethylene glycol (MIRALAX / GLYCOLAX) packet 17 g  17 g Oral Daily PRN Lytle Butte, MD      . sodium chloride 0.9 % injection 3 mL  3 mL Intravenous Q12H Lytle Butte, MD   3 mL at 09/21/15 1000      Allergies: Allergies  Allergen Reactions  . Penicillins Other (See Comments)    Reaction:  Unknown       Past Medical History: Past Medical History  Diagnosis Date  . Cancer ([place])   . Arthritis   . Renal disorder   . Hypertension   . Diabetes mellitus without complication ([place])   . Asthma   . COPD (chronic obstructive pulmonary disease) ([place])   . Coronary artery disease      Past Surgical History: Past Surgical History  Procedure Laterality Date  . No past surgeries    . Insertion of dialysis catheter    . Insertion of dialysis catheter N/A 05/29/2015    Procedure: INSERTION OF DIALYSIS CATHETER;  Surgeon: Angelia Mould, MD;  Location: [place];  Service: Vascular;  Laterality: N/A;     Family History: Family  History  Problem Relation Age of Onset  . Hypertension Other   . Diabetes Other      Social History: Social History   Social History  . Marital Status: Married    Spouse Name: N/A  . Number of Children: N/A  . Years of Education: N/A   Occupational History  . Not on file.   Social History Main Topics  . Smoking status: Never Smoker   . Smokeless tobacco: Never Used  . Alcohol Use: No  . Drug Use: No  . Sexual Activity: Not on file   Other Topics Concern  . Not on file   Social History Narrative     Review of Systems: Patient unable to provide ROS due to lethargy.  Vital Signs: Blood pressure 173/122, pulse 57, temperature 97.9 F (36.6 C), temperature source Oral, resp. rate 38, height 5\' 7"  (1.702 m), weight 63.957 kg (141 lb), SpO2 99 %.  Weight trends: Filed Weights   09/20/15 1338  Weight: 63.957 kg (141 lb)    Physical Exam: General: Lethargic but arousable  Head: Normocephalic, atraumatic.  Eyes: Muddy brown sclera   Nose: Mucous membranes moist, not inflammed, nonerythematous.  Throat: Oropharynx nonerythematous, no exudate appreciated.   Neck: Supple, trachea midline.  Lungs:  Normal respiratory effort. Bilateral rales.  Heart: S1S2 no rubs  Abdomen:  BS normoactive. Soft, Nondistended, non-tender.  No masses or organomegaly.  Extremities: 1+ b/l LE edema  Neurologic: A&O X3, Motor strength is 5/5 in the all 4 extremities  Skin: No visible rashes, scars.    Lab results: Basic Metabolic Panel:  Recent Labs Lab 09/20/15 1348  NA 139  K 4.8  CL 106  CO2 21*  GLUCOSE 80  BUN 90*  CREATININE 11.54*  CALCIUM 8.6*    Liver Function Tests: No results for input(s): AST, ALT, ALKPHOS, BILITOT, PROT, ALBUMIN in the last 168 hours. No results for input(s): LIPASE, AMYLASE in the last 168 hours. No results for input(s): AMMONIA in the last 168 hours.  CBC:  Recent Labs Lab 09/20/15 1348  WBC 6.2  HGB 10.4*  HCT 33.0*  MCV 83.0  PLT  180    Cardiac Enzymes:  Recent Labs Lab 09/20/15 1348  TROPONINI 0.03    BNP: Invalid input(s): POCBNP  CBG: No results for input(s): GLUCAP in the last 168 hours.  Microbiology: Results for orders placed or performed during the hospital encounter of 09/20/15  MRSA PCR Screening     Status: None   Collection Time: 09/21/15  9:15 AM  Result Value Ref Range Status   MRSA by PCR NEGATIVE NEGATIVE Final    Comment:        The GeneXpert MRSA Assay (FDA approved for NASAL specimens only), is one component of a comprehensive MRSA colonization surveillance program. It is not intended to diagnose MRSA infection nor to guide or monitor treatment for MRSA infections.     Coagulation Studies: No results for input(s): LABPROT, INR in the last 72 hours.  Urinalysis: No results for input(s): COLORURINE, LABSPEC, PHURINE, GLUCOSEU, HGBUR, BILIRUBINUR, KETONESUR, PROTEINUR, UROBILINOGEN, NITRITE, LEUKOCYTESUR in the last 72 hours.  Invalid input(s): APPERANCEUR    Imaging: Ct Abdomen Pelvis Wo Contrast  09/20/2015  CLINICAL DATA:  Low back pain.  Hemodialysis. EXAM: CT ABDOMEN AND PELVIS WITHOUT CONTRAST TECHNIQUE: Multidetector CT imaging of the abdomen and pelvis was performed following the standard protocol without IV contrast. COMPARISON:  11/19/2013 CT abdomen/pelvis. FINDINGS: Study is motion degraded. Lower chest: Diffuse ground-glass opacity in the visualized  bilateral lung bases. Stable 4 mm solid left lower lobe pulmonary nodule (series 4/image 7). Stable tiny calcified granulomas in the right lower lobe. Mild cardiomegaly, increased. Trace bilateral pleural effusions. Right common femoral approach central venous catheter terminates over the right atrium on the scout topogram. Hepatobiliary: Normal liver with no liver mass. Normal gallbladder with no radiopaque cholelithiasis. No biliary ductal dilatation. Pancreas: Normal, with no mass or duct dilation. Spleen: Normal size.  No mass. Adrenals/Urinary Tract: Normal adrenals. No hydronephrosis. Symmetric bilateral renal atrophy. There is an exophytic 1.3 cm renal mass in the posterior interpolar right kidney (series 2/ image 23), which is new since 11/19/2013 CT and demonstrates indeterminate unenhanced CT density of 32 HU. No additional contour deforming renal masses. No nephrolithiasis. Normal caliber ureters, with no ureteral stones. Mild diffuse bladder wall thickening, decreased since 11/19/2013. Stomach/Bowel: Grossly normal stomach. Normal caliber small bowel with no small bowel wall thickening. Normal appendix. Normal large bowel with no diverticulosis, large bowel wall thickening or pericolonic fat stranding. Vascular/Lymphatic: Normal caliber abdominal aorta. No pathologically enlarged lymph nodes in the abdomen or pelvis. Reproductive: Mild prostatomegaly, decreased since 11/19/2013. Other: No pneumoperitoneum, ascites or focal fluid collection. Musculoskeletal: No aggressive appearing focal osseous lesions. Stable non expansile lytic lesion in the left iliac wing (series 2/ image 53) without aggressive imaging features. 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 Moderate degenerative changes in the visualized thoracolumbar spine, most prominent at L5-S1. IMPRESSION: 1. Diffuse ground-glass opacity at the visualized lung bases. Increased mild cardiomegaly. Trace bilateral pleural effusions. Findings suggest mild congestive heart failure/fluid overload. 2. New indeterminate exophytic 1.3 cm right renal mass, renal neoplasm not excluded. Given that the patient is on hemodialysis, recommend attempted characterization of this renal mass on a renal ultrasound. If the mass remains indeterminate on renal ultrasound, consider further evaluation with CT of the abdomen with and without intravenous contrast coordinated with hemodialysis schedule. 3. Mild prostatomegaly, decreased. Mild diffuse bladder wall thickening,  decreased, suggestive of chronic bladder outlet obstruction by the enlarged prostate. Recommend correlation with urinalysis. No hydronephrosis. 4. 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 Electronically Signed   By: Ilona Sorrel M.D.   On: 09/20/2015 19:06   Dg Chest Port 1 View  09/20/2015  CLINICAL DATA:  Low back pain and shortness of Breath EXAM: PORTABLE CHEST - 1 VIEW COMPARISON:  05/30/2015 FINDINGS: Cardiac shadow is enlarged but stable. The lungs are well aerated bilaterally. No focal confluent infiltrate is seen. No bony abnormality is noted. IMPRESSION: No active disease. Electronically Signed   By: Inez Catalina M.D.   On: 09/20/2015 21:47      Assessment & Plan: Pt is a 64 y.o. male with a PMHx of osteoarthritis, end-stage renal disease on hemodialysis in [place], hypertension, diabetes mellitus, COPD, coronary artery disease,, who was admitted to [facility] on 09/20/2015 for evaluation of back pain.   1.  End-stage renal disease on hematologist Tuesday, Thursday, Saturday.  The patient apparently missed the last 2 hemodialysis sessions however I am unable to confirm this as it is unclear to what dialysis center he goes to and he is unable to tell me when he last went to dialysis.  Therefore we will proceed with hemodialysis today.  I have prepared or murmurs.  2.  Hypertension.  Blood pressure quite elevated at 173/72.  Continue labetalol for now.  We will add losartan 100 mg by mouth daily to his medication regimen.  3.  Anemia of CKD:  Hemoglobin currently 10.4.  If hemoglobin drops below 10 consider Epogen.  4.  Secondary hyperparathyroidism.  Check intact PTH and phosphorus today.  Continue PhosLo and calcitriol at this time.

## 2015-09-21 NOTE — NC FL2 (Cosign Needed)
[place] LEVEL OF CARE SCREENING TOOL     IDENTIFICATION  Patient Name: Noah Lewis Birthdate: Jan 13, 1951 Sex: male Admission Date (Current Location): 09/20/2015  [facility] [place] Number:  Selena Lesser  ([id number]) Facility and Address:  [facility], [address] [place], [place] [address]      Provider Number: [id number]  Attending Physician Name and Address:  Nicholes Mango, MD  Relative Name and Phone Number:       Current Level of Care: Hospital Recommended Level of Care: Other (Comment) (Group Home ) Prior Approval Number:    Date Approved/Denied:   PASRR Number:  ( [id number] )  Discharge Plan: Other (Comment) (Group Home )    Current Diagnoses: Patient Active Problem List   Diagnosis Date Noted  . Hypertensive urgency 09/20/2015  . Hepatitis C antibody test positive 06/14/2015  . 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 Anemia of chronic kidney failure   . Left arm swelling   . End-stage renal disease needing dialysis ([place]) 05/29/2015  . ESRD needing dialysis ([place]) 05/29/2015  . 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 COPD, moderate ([place]) 05/24/2015  . OSA (obstructive sleep apnea) 05/24/2015  . Type II diabetes mellitus with end-stage renal disease ([place]) 05/24/2015  . ESRD on hemodialysis ([place]) 05/13/2015  . New onset atrial fibrillation ([place]) 05/13/2015  . Volume overload 04/28/2015  . Bipolar depression ([place]) 04/28/2015  . HTN (hypertension) 04/28/2015  . Chronic back pain 04/28/2015  . Facial swelling 04/28/2015  . Left upper extremity swelling     Orientation RESPIRATION BLADDER Height & Weight    Self, Time, Situation, Place  O2 (3 Liters of Oxygen ) Continent 5\' 7"  (170.2 cm) 141 lbs.  BEHAVIORAL SYMPTOMS/MOOD NEUROLOGICAL BOWEL NUTRITION STATUS   (none )  (none ) Continent Diet (Renal Diet Fluid Restrictions )   AMBULATORY STATUS COMMUNICATION OF NEEDS Skin   Independent Verbally Normal                       Personal Care Assistance Level of Assistance  Bathing, Feeding, Dressing, Total care Bathing Assistance: Independent Feeding assistance: Independent Dressing Assistance: Independent     Functional Limitations Info  Sight, Hearing, Speech Sight Info: Adequate Hearing Info: Impaired Speech Info: Adequate    SPECIAL CARE FACTORS FREQUENCY   (Dialysis )                    Contractures      Additional Factors Info  Code Status, Allergies, Isolation Precautions Code Status Info:  (Full Code. ) Allergies Info:  (Penicillins)     Isolation Precautions Info:  (MRSA Nasal Swab )     Current Medications (09/21/2015):  This is the current hospital active medication list Current Facility-Administered Medications  Medication Dose Route Frequency Provider Last Rate Last Dose  . acetaminophen (TYLENOL) tablet 650 mg  650 mg Oral Q6H PRN Lytle Butte, MD       Or  . acetaminophen (TYLENOL) suppository 650 mg  650 mg Rectal Q6H PRN Lytle Butte, MD      . aspirin chewable tablet 81 mg  81 mg Oral Daily Lytle Butte, MD   81 mg at 09/21/15 [id number]  . calcitRIOL (ROCALTROL) capsule 1 mcg  1 mcg Oral Q T,Th,Sa-HD Lytle Butte, MD   1 mcg at 09/21/15 1151  . calcium acetate (PHOSLO) capsule 2,001 mg  2,001 mg Oral TID WC Lytle Butte, MD   2,001 mg at 09/21/15 [phone]  . capsaicin (ZOSTRIX) 0.025 % cream   Topical BID PRN Lytle Butte, MD      . heparin injection 5,000 Units  5,000 Units Subcutaneous 3 times per day Lytle Butte, MD   5,000 Units at 09/21/15 0054  . ipratropium-albuterol (DUONEB) 0.5-2.5 (3) MG/3ML nebulizer solution 3 mL  3 mL Nebulization Q4H PRN Lytle Butte, MD      . labetalol (NORMODYNE) tablet 200 mg  200 mg Oral TID Lytle Butte, MD   200 mg at 09/21/15 [id number]  . loratadine (CLARITIN) tablet 10 mg  10 mg Oral Daily Lytle Butte, MD   10 mg at 09/21/15 0837   . morphine 2 MG/ML injection 2 mg  2 mg Intravenous Q4H PRN Lytle Butte, MD   2 mg at 09/21/15 0525  . ondansetron (ZOFRAN) tablet 4 mg  4 mg Oral Q6H PRN Lytle Butte, MD       Or  . ondansetron [facility]) injection 4 mg  4 mg Intravenous Q6H PRN Lytle Butte, MD      . oxyCODONE (Oxy IR/ROXICODONE) immediate release tablet 5 mg  5 mg Oral Q4H PRN Lytle Butte, MD      . polyethylene glycol (MIRALAX / GLYCOLAX) packet 17 g  17 g Oral Daily PRN Lytle Butte, MD      . sodium chloride 0.9 % injection 3 mL  3 mL Intravenous Q12H Lytle Butte, MD   3 mL at 09/21/15 1000     Discharge Medications: Please see discharge summary for a list of discharge medications.  Relevant Imaging Results:  Relevant Lab Results:   Additional Information  (Dialysis SSN: [id number])  Loralyn Freshwater, LCSW

## 2015-09-21 NOTE — Progress Notes (Signed)
Spoke with Dr. Margaretmary Eddy about elevated BP. Ordered to recheck BP in one hour since PO labetalol just given. Patient is scheduled to go to dialysis today. Wilnette Kales

## 2015-09-21 NOTE — Progress Notes (Signed)
Post hd tx 

## 2015-09-21 NOTE — Progress Notes (Signed)
Dr. Margaretmary Eddy notified of BP after lopressor, patient is in dialysis now. Wants BP recheck after dialysis. Noah Lewis

## 2015-09-21 NOTE — Clinical Social Work Note (Addendum)
Clinical Social Work Assessment  Patient Details  Name: Noah Lewis MRN: [id number] Date of Birth: [birth year]  Date of referral:  09/21/15               Reason for consult:  Discharge Planning                Permission sought to share information with:    Permission granted to share information::     Name::        Agency::    [facility] [id number]  Fax Number [phone]  Relationship::     Contact Information:     Housing/Transportation Living arrangements for the past 2 months:  Group Home ([place]) Source of Information:  Other (Comment Required), Facility ([place]) Patient Interpreter Needed:  None Criminal Activity/Legal Involvement Pertinent to Current Situation/Hospitalization:  No - Comment as needed Significant Relationships:    Lives with:  Other (Comment) ([place]) Do you feel safe going back to the place where you live?    Need for family participation in patient care:     Care giving concerns:  Patient is from a [facility].  Social Worker assessment / plan: CSW was alerted by RN that patient is from a group home. CSW contacted Dellis Anes [phone] ) to gather information about patient's living arrangements. Per Mariella Saa patient is a resident at [facility]. He reports that patient has been a resident since Nov. 8, 2016. Darnell reports that he's the manager at the facility. Per Mariella Saa patient uses a cane and little assistance for mobility. He reports does not have a guardian and makes his own decisions. Patient receives Dialysis from [facility] ([place]. [place], [place] Per Mariella Saa patient has refused his last two dialysis appointments. Mariella Saa reports that he's concerned about patient's mental health state and reports that patient has been exhibiting symptoms of depression. Per Mariella Saa patient can return to [facility] at discharge and [facility] will  provide transportation. Fax number for [facility] is [phone]  CSW went to speak to patient. Patient was asleep in bed. CSW attempted several times to wake patient up. During these attempts patient reports "I'm fine" and went back to sleep. CSW will attempt to speak to patient again at a later time.   CSW contacted [facility] ([place]. [place], [place]) and spoke to nurse [employer]. Per Cathlean Cower patient is a dialysis patient at their facility. She reports that patient's dialysis schedule is T, TH, Sat. at 11AM. Cathlean Cower reports that patient's last dialysis session was 09/15/2015 and that patient is "non-compliant". She reports that patient has a diagnosis of Bipolar- Depression but does not have any psychotropic medications listed on his medication list.   CSW discussed with attending MD (Dr. Margaretmary Eddy) and RN about the mental health concerns recievied from patient's group home manager, Mariella Saa and Dialysis nurse, Cathlean Cower. A psych. Consult will be ordered.   CSW attempted to speak to patient for second time but patient was not in the room and receiving dialysis. CSW will continue follow and assist as needed.   FL2 complete.   Employment status:  Disabled (Comment on whether or not currently receiving Disability), Retired Forensic scientist:  Medicaid In [place] PT Recommendations:  Not assessed at this time Information / Referral to community resources:     Patient/Family's Response to care:  CSW was unable to assess patient's response to care at this time. CSW  will evaluate patient at a later time. Per Mariella Saa at [facility] patient can return at discharge.   Patient/Family's Understanding of and Emotional Response to Diagnosis, Current Treatment, and Prognosis:  CSW was unable to assess patient's understanding at this time. CSW will attempt to evaluate at a later time. Per Mariella Saa, from [facility] he is concerned about patient refusing dialysis and exhibiting symptoms of  depression.   Emotional Assessment Appearance:    Attitude/Demeanor/Rapport:  Unable to Assess Affect (typically observed):  Unable to Assess Orientation:   (Unable to assess) Alcohol / Substance use:  Not Applicable Psych involvement (Current and /or in the community):  No (Comment) (Per [place] patient has a history of [place] Depression)  Discharge Needs  Concerns to be addressed:  Discharge Planning Concerns, Mental Health Concerns Readmission within the last 30 days:  No Current discharge risk:  Psychiatric Illness, Chronically ill Barriers to Discharge:  Continued Medical Work up   [employer], LCSW 09/21/2015, 3:11 PM

## 2015-09-21 NOTE — Progress Notes (Signed)
Hemodialysis treatment start 

## 2015-09-22 ENCOUNTER — Inpatient Hospital Stay: Payer: Medicaid Other

## 2015-09-22 LAB — BASIC METABOLIC PANEL
ANION GAP: 12 (ref 5–15)
BUN: 53 mg/dL — ABNORMAL HIGH (ref 6–20)
CALCIUM: 8.7 mg/dL — AB (ref 8.9–10.3)
CHLORIDE: 100 mmol/L — AB (ref 101–111)
CO2: 28 mmol/L (ref 22–32)
Creatinine, Ser: 7.67 mg/dL — ABNORMAL HIGH (ref 0.61–1.24)
GFR calc non Af Amer: 7 mL/min — ABNORMAL LOW (ref >60)
GFR, EST AFRICAN AMERICAN: 8 mL/min — AB (ref >60)
GLUCOSE: 100 mg/dL — AB (ref 65–99)
POTASSIUM: 4.3 mmol/L (ref 3.5–5.1)
Sodium: 140 mmol/L (ref 135–145)

## 2015-09-22 LAB — CBC
HEMATOCRIT: 32.3 % — AB (ref 40.0–52.0)
HEMOGLOBIN: 10.4 g/dL — AB (ref 13.0–18.0)
MCH: 26.2 pg (ref 26.0–34.0)
MCHC: 32.1 g/dL (ref 32.0–36.0)
MCV: 81.5 fL (ref 80.0–100.0)
Platelets: 158 10*3/uL (ref 150–440)
RBC: 3.97 MIL/uL — AB (ref 4.40–5.90)
RDW: 18.4 % — ABNORMAL HIGH (ref 11.5–14.5)
WBC: 7.6 10*3/uL (ref 3.8–10.6)

## 2015-09-22 LAB — HEPATITIS B SURFACE ANTIGEN: Hepatitis B Surface Ag: NEGATIVE

## 2015-09-22 LAB — PARATHYROID HORMONE, INTACT (NO CA): PTH: 197 pg/mL — ABNORMAL HIGH (ref 15–65)

## 2015-09-22 MED ORDER — OXYCODONE-ACETAMINOPHEN 5-325 MG PO TABS: 1.0000 | ORAL_TABLET | Freq: Four times a day (QID) | ORAL | Status: DC | PRN

## 2015-09-22 MED ORDER — ENALAPRIL MALEATE 5 MG PO TABS: 5.0000 mg | ORAL_TABLET | Freq: Every day | ORAL | Status: DC

## 2015-09-22 NOTE — Discharge Summary (Signed)
[facility] NAME: Noah Lewis    MR#:  [id number]  DATE OF BIRTH:  08-09-1951  DATE OF ADMISSION:  09/20/2015 ADMITTING PHYSICIAN: Lytle Butte, MD  DATE OF DISCHARGE: 09/22/2015 PRIMARY CARE PHYSICIAN: PROVIDER NOT IN SYSTEM    ADMISSION DIAGNOSIS:  SOB (shortness of breath) [R06.02] Bilateral low back pain without sciatica [M54.5]  DISCHARGE DIAGNOSIS:  Active Problems:   Hypertensive urgency  renal mass-right kidney  SECONDARY DIAGNOSIS:   Past Medical History  Diagnosis Date  . Cancer ([place])   . Arthritis   . Renal disorder   . Hypertension   . Diabetes mellitus without complication ([place])   . Asthma   . COPD (chronic obstructive pulmonary disease) ([place])   . Coronary artery disease     HOSPITAL COURSE:   64 year African-American gentleman history of end-stage renal disease on hemodialysis presenting with back pain  # . Hypertensive urgency: Secondary to multiple missed dialysis  Labetolol  vasotec added to the regimen  # Right kidney upper pole renal mass - renal u/s -shows right upper pole kidney mass, outpatient follow-up with urology as recommended, discussed with nephrologist Dr. Juleen China he is aware  # . End-stage renal disease on hemodialysis: Consult nephrology for continuation  hemodialysis  3. Chronic low back pain: Back to baseline/improved  4. Venous thromboembolism prophylactic: Heparin subcutaneous   DISCHARGE CONDITIONS:   fair  CONSULTS OBTAINED:  Treatment Team:  Anthonette Legato, MD Gonzella Lex, MD   PROCEDURES  HD   DRUG ALLERGIES:   Allergies  Allergen Reactions  . Penicillins Other (See Comments)    Reaction:  Unknown     DISCHARGE MEDICATIONS:   Current Discharge Medication List    START taking these medications   Details  enalapril (VASOTEC) 5 MG tablet Take 1 tablet (5 mg total) by mouth daily. Qty: 30 tablet, Refills: 0     oxyCODONE-acetaminophen (ROXICET) 5-325 MG tablet Take 1 tablet by mouth every 6 (six) hours as needed. Qty: 15 tablet, Refills: 0      CONTINUE these medications which have NOT CHANGED   Details  acetaminophen (TYLENOL) 325 MG tablet Take 1 tablet (325 mg total) by mouth every 6 (six) hours as needed for moderate pain. Qty: 50 tablet, Refills: 1    aspirin 81 MG chewable tablet Chew 1 tablet (81 mg total) by mouth daily. Qty: 30 tablet, Refills: 11    calcitRIOL (ROCALTROL) 0.5 MCG capsule Take 2 capsules (1 mcg total) by mouth Every Tuesday,Thursday,and Saturday with dialysis. Qty: 30 capsule, Refills: 0    calcium acetate (PHOSLO) 667 MG capsule Take 3 capsules (2,001 mg total) by mouth 3 (three) times daily with meals. Qty: 270 capsule, Refills: 2    capsaicin (ZOSTRIX) 0.025 % cream Apply topically 2 (two) times daily as needed (low back pain). Qty: 60 g, Refills: 0    Darbepoetin Alfa (ARANESP) 25 MCG/0.42ML SOSY injection Inject 0.42 mLs (25 mcg total) into the vein every Thursday with hemodialysis. Qty: 14.28 mL, Refills: 0    ipratropium-albuterol (DUONEB) 0.5-2.5 (3) MG/3ML SOLN Take 3 mLs by nebulization every 6 (six) hours as needed. Qty: 360 mL, Refills: 2    labetalol (NORMODYNE) 200 MG tablet Take 1 tablet (200 mg total) by mouth 3 (three) times daily. 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 Qty: 30 tablet, Refills: 11   Associated Diagnoses: COPD, moderate ([facility])    polyethylene glycol (MIRALAX / GLYCOLAX) packet Take 17 g by mouth daily as needed for mild constipation. Qty: 14 each, Refills: 0         DISCHARGE INSTRUCTIONS:    Activity as tolerated with walker  Continue hemodialysis as recommended by your nephrology, on Tuesday Thursday and Saturday Follow-up with primary  care physician in a day at facility Follow up with nephrology  Or Dr. Holley Raring tomorrow at dialysis center Follow up with pulmonology Dr. Mortimer Fries  in 1-2 weeks for outpatient sleep study to rule out obstructive sleep apnea Follow-up with urology as an outpatient regarding renal mass in a week   DIET:  Renal diet  DISCHARGE CONDITION:  Fair  ACTIVITY:  Activity as tolerated  OXYGEN:  Home Oxygen: Yes.     Oxygen Delivery: 2 liters/min via Patient connected to nasal cannula oxygen  DISCHARGE LOCATION:  Group home  If you experience worsening of your admission symptoms, develop shortness of breath, life threatening emergency, suicidal or homicidal thoughts you must seek medical attention immediately by calling 911 or calling your MD immediately  if symptoms less severe.  You Must read complete instructions/literature along with all the possible adverse reactions/side effects for all the Medicines you take and that have been prescribed to you. Take any new Medicines after you have completely understood and accpet all the possible adverse reactions/side effects.   Please note  You were cared for by a hospitalist during your hospital stay. If you have any questions about your discharge medications or the care you received while you were in the hospital after you are discharged, you can call the unit and asked to speak with the hospitalist on call if the hospitalist that took care of you is not available. Once you are discharged, your primary care physician will handle any further medical issues. Please note that NO REFILLS for any discharge medications will be authorized once you are discharged, as it is imperative that you return to your primary care physician (or establish a relationship with a primary care physician if you do not have one) for your aftercare needs so that they can reassess your need for medications and monitor your lab values.     Today  Chief Complaint  Patient presents  with  . Back Pain   Patient is resting comfortably. Denies any headache or shortness of breath. Very hard of hearing. Had hemodialysis yesterday. Patient is falling asleep while talking, not sure if he has obstructive sleep apnea, recommended outpatient follow-up with pulmonology to get sleep study  ROS:  CONSTITUTIONAL: Denies fevers, chills. Denies any fatigue, weakness.  EYES: Denies blurry vision, double vision, eye pain. EARS, NOSE, THROAT: Denies tinnitus, ear pain, hearing loss. RESPIRATORY: Denies cough, wheeze, shortness of breath.  CARDIOVASCULAR: Denies chest pain, palpitations, edema.  GASTROINTESTINAL: Denies nausea, vomiting, diarrhea, abdominal pain. Denies bright red blood per rectum. GENITOURINARY: Denies dysuria, hematuria. ENDOCRINE: Denies nocturia or thyroid problems. HEMATOLOGIC AND LYMPHATIC: Denies easy bruising or bleeding. SKIN: Denies rash or lesion. MUSCULOSKELETAL: Denies pain in neck, back, shoulder, knees, hips or arthritic symptoms.  NEUROLOGIC: Denies paralysis, paresthesias.  PSYCHIATRIC: Denies anxiety or depressive symptoms.   VITAL SIGNS:  Blood pressure 109/97, pulse 86, temperature 97.9 F (36.6 C), temperature source Oral, resp. rate 22, height 5\' 7"  (1.702 m), weight 79.062 kg (174 lb 4.8 oz), SpO2 98 %.  I/O:    Intake/Output Summary (Last 24 hours) at 09/22/15 1256 Last data filed at 09/22/15 1012  Gross per 24 hour  Intake      0 ml  Output    150 ml  Net   -150 ml    PHYSICAL EXAMINATION:  GENERAL:  64 y.o.-year-old patient lying in the bed with no acute distress.  EYES: Pupils equal, round, reactive to light and accommodation. No scleral icterus. Extraocular muscles intact.  HEENT: Head atraumatic, normocephalic. Oropharynx and nasopharynx clear.  NECK:  Supple, no jugular venous distention. No thyroid enlargement, no tenderness.  LUNGS: Normal breath sounds bilaterally, no wheezing, rales,rhonchi or crepitation. No use of  accessory muscles of respiration.  CARDIOVASCULAR: S1, S2 normal. No murmurs, rubs, or gallops.  ABDOMEN: Soft, non-tender, non-distended. Bowel sounds present. No organomegaly or mass.  EXTREMITIES: No pedal edema, cyanosis, or clubbing.  NEUROLOGIC: Falls asleep while talking frequently but arousable to verbal commands and answers all questions appropriately .Cranial nerves II through XII are intact. Muscle strength 5/5 in all extremities. Sensation intact. Gait not checked.  PSYCHIATRIC: The patient is alert and oriented x 3.  SKIN: No obvious rash, lesion, or ulcer.   DATA REVIEW:   CBC  Recent Labs Lab 09/22/15 0358  WBC 7.6  HGB 10.4*  HCT 32.3*  PLT 158    Chemistries   Recent Labs Lab 09/22/15 0358  NA 140  K 4.3  CL 100*  CO2 28  GLUCOSE 100*  BUN 53*  CREATININE 7.67*  CALCIUM 8.7*    Cardiac Enzymes  Recent Labs Lab 09/20/15 1348  TROPONINI 0.03    Microbiology Results  Results for orders placed or performed during the hospital encounter of 09/20/15  MRSA PCR Screening     Status: None   Collection Time: 09/21/15  9:15 AM  Result Value Ref Range Status   MRSA by PCR NEGATIVE NEGATIVE Final    Comment:        The GeneXpert MRSA Assay (FDA approved for NASAL specimens only), is one component of a comprehensive MRSA colonization surveillance program. It is not intended to diagnose MRSA infection nor to guide or monitor treatment for MRSA infections.     RADIOLOGY:  Ct Abdomen Pelvis Wo Contrast  09/20/2015  CLINICAL DATA:  Low back pain.  Hemodialysis. EXAM: CT ABDOMEN AND PELVIS WITHOUT CONTRAST TECHNIQUE: Multidetector CT imaging of the abdomen and pelvis was performed following the standard protocol without IV contrast. COMPARISON:  11/19/2013 CT abdomen/pelvis. FINDINGS: Study is motion degraded. Lower chest: Diffuse ground-glass opacity in the visualized bilateral lung bases. Stable 4 mm solid left lower lobe pulmonary nodule (series  4/image 7). Stable tiny calcified granulomas in the right lower lobe. Mild cardiomegaly, increased. Trace bilateral pleural effusions. Right common femoral approach central venous catheter terminates over the right atrium on the scout topogram. Hepatobiliary: Normal liver with no liver mass. Normal gallbladder with no radiopaque cholelithiasis. No biliary ductal dilatation. Pancreas: Normal, with no mass or duct dilation. Spleen: Normal size. No mass. Adrenals/Urinary Tract: Normal adrenals. No hydronephrosis. Symmetric bilateral renal atrophy. There is an exophytic 1.3 cm renal mass in the posterior interpolar right kidney (series 2/ image 23), which is new since 11/19/2013 CT and demonstrates indeterminate unenhanced CT density of 32 HU. No additional contour deforming renal masses. No nephrolithiasis. Normal caliber ureters, with no ureteral stones. Mild diffuse bladder wall thickening, decreased since 11/19/2013. Stomach/Bowel: Grossly normal stomach. Normal caliber small bowel with no small bowel wall thickening. Normal  appendix. Normal large bowel with no diverticulosis, large bowel wall thickening or pericolonic fat stranding. Vascular/Lymphatic: Normal caliber abdominal aorta. No pathologically enlarged lymph nodes in the abdomen or pelvis. Reproductive: Mild prostatomegaly, decreased since 11/19/2013. Other: No pneumoperitoneum, ascites or focal fluid collection. Musculoskeletal: No aggressive appearing focal osseous lesions. Stable non expansile lytic lesion in the left iliac wing (series 2/ image 53) without aggressive imaging features. 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 Moderate degenerative changes in the visualized thoracolumbar spine, most prominent at L5-S1. IMPRESSION: 1. Diffuse ground-glass opacity at the visualized lung bases. Increased mild cardiomegaly. Trace bilateral pleural effusions. Findings suggest mild congestive heart failure/fluid overload. 2. New  indeterminate exophytic 1.3 cm right renal mass, renal neoplasm not excluded. Given that the patient is on hemodialysis, recommend attempted characterization of this renal mass on a renal ultrasound. If the mass remains indeterminate on renal ultrasound, consider further evaluation with CT of the abdomen with and without intravenous contrast coordinated with hemodialysis schedule. 3. Mild prostatomegaly, decreased. Mild diffuse bladder wall thickening, decreased, suggestive of chronic bladder outlet obstruction by the enlarged prostate. Recommend correlation with urinalysis. No hydronephrosis. 4. 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 Electronically Signed   By: Ilona Sorrel M.D.   On: 09/20/2015 19:06   US Renal  09/22/2015  CLINICAL DATA:  Right renal mass identified on noncontrast CT. EXAM: RENAL / URINARY TRACT ULTRASOUND COMPLETE COMPARISON:  09/20/2015 FINDINGS: Right Kidney: Length: 8.6 cm. Increased renal echogenicity. Circumscribed hypoechoic mass is identified in the upper pole region measuring 1.6 x 1.4 x 1.8 cm, corresponding to the abnormality identified on CT exam. There is no internal blood flow by Doppler evaluation. Considerations include mildly complicated cyst or solid mass. A small cyst is identified in the medial aspect of the kidney measuring 1.0 x 1.0 x 0.8 cm. There is no hydronephrosis. Left Kidney: Length: 7.5 cm. Increased renal echogenicity. Renal parenchymal thinning is noted. No focal mass or hydronephrosis. Bladder: Bladder wall thickening is noted.  No focal mass. IMPRESSION: 1. Indeterminate upper pole mass in the right kidney, 1.8 cm maximum diameter. 2. Contrast-enhanced CT of the abdomen renal protocol is recommended for further characterization. 3. Bladder wall thickening consistent with bladder outlet obstruction. 4. No hydronephrosis. Electronically Signed   By: Nolon Nations M.D.   On: 09/22/2015 12:33   Dg  Chest Port 1 View  09/20/2015  CLINICAL DATA:  Low back pain and shortness of Breath EXAM: PORTABLE CHEST - 1 VIEW COMPARISON:  05/30/2015 FINDINGS: Cardiac shadow is enlarged but stable. The lungs are well aerated bilaterally. No focal confluent infiltrate is seen. No bony abnormality is noted. IMPRESSION: No active disease. Electronically Signed   By: Inez Catalina M.D.   On: 09/20/2015 21:47    EKG:   Orders placed or performed during the hospital encounter of 09/20/15  . ED EKG within 10 minutes  . ED EKG within 10 minutes  . EKG 12-Lead  . EKG 12-Lead      Management plans discussed with the patient, family and they are in agreement.  CODE STATUS:     Code Status Orders        Start     Ordered   09/20/15 2228  Full code   Continuous     09/20/15 2227      TOTAL TIME TAKING CARE OF THIS PATIENT: 45  minutes.    @[facility] @  on 09/22/2015 at 12:56 PM  Between  7am to 6pm - Pager - [phone]  After 6pm go to [email or web address] - password [email or web address] [facility]  Office  [phone]  CC: Primary care physician; PROVIDER NOT IN SYSTEM

## 2015-09-22 NOTE — Progress Notes (Signed)
Patient is medically stable for D/C back to [facility] today. Group Home administrator Mariella Saa will pick patient up between 5 and 5:50 pm today. Per RN Case Manager patient's oxygen is being set up and a portable tank will be dropped off in the room. CSW faxed D/C Summary, FL2 and prescription to group home. Please reconsult if future social work needs arise. CSW signing off.   Blima Rich, [place] [phone]

## 2015-09-22 NOTE — Progress Notes (Signed)
[facility]  ROUNDING NOTE   Subjective:   Hemodialysis yesterday. Tolerated treatment well. UF of even  Laying in bed. Alert to self - slow to answer questions  Objective:  Vital signs in last 24 hours:  Temp:  [97.5 F (36.4 C)-98.7 F (37.1 C)] 97.6 F (36.4 C) (12/30 0409) Pulse Rate:  [57-102] 89 (12/30 1009) Resp:  [14-26] 24 (12/30 0409) BP: (140-190)/(81-143) 140/93 mmHg (12/30 0813) SpO2:  [94 %-100 %] 94 % (12/30 0813) Weight:  [79.062 kg (174 lb 4.8 oz)] 79.062 kg (174 lb 4.8 oz) (12/29 1813)  Weight change: 15.105 kg (33 lb 4.8 oz) Filed Weights   09/20/15 1338 09/21/15 1813  Weight: 63.957 kg (141 lb) 79.062 kg (174 lb 4.8 oz)    Intake/Output: I/O last 3 completed shifts: In: -  Out: 400 [Urine:400]   Intake/Output this shift:  Total I/O In: -  Out: 150 [Urine:150]  Physical Exam: General: NAD  Head: Normocephalic, atraumatic. Moist oral mucosal membranes  Eyes: Anicteric, PERRL  Neck: Supple, trachea midline  Lungs:  Clear to auscultation  Heart: Regular rate and rhythm  Abdomen:  Soft, nontender,   Extremities: 1+ peripheral edema.  Neurologic: Nonfocal, moving all four extremities  Skin: No lesions  Access: Left arm AVF    Basic Metabolic Panel:  Recent Labs Lab 09/20/15 1348 09/21/15 1437 09/22/15 0358  NA 139  --  140  K 4.8  --  4.3  CL 106  --  100*  CO2 21*  --  28  GLUCOSE 80  --  100*  BUN 90*  --  53*  CREATININE 11.54*  --  7.67*  CALCIUM 8.6*  --  8.7*  PHOS  --  8.4*  --     Liver Function Tests: No results for input(s): AST, ALT, ALKPHOS, BILITOT, PROT, ALBUMIN in the last 168 hours. No results for input(s): LIPASE, AMYLASE in the last 168 hours. No results for input(s): AMMONIA in the last 168 hours.  CBC:  Recent Labs Lab 09/20/15 1348 09/22/15 0358  WBC 6.2 7.6  HGB 10.4* 10.4*  HCT 33.0* 32.3*  MCV 83.0 81.5  PLT 180 158    Cardiac Enzymes:  Recent Labs Lab 09/20/15 1348   TROPONINI 0.03    BNP: Invalid input(s): POCBNP  CBG: No results for input(s): GLUCAP in the last 168 hours.  Microbiology: Results for orders placed or performed during the hospital encounter of 09/20/15  MRSA PCR Screening     Status: None   Collection Time: 09/21/15  9:15 AM  Result Value Ref Range Status   MRSA by PCR NEGATIVE NEGATIVE Final    Comment:        The GeneXpert MRSA Assay (FDA approved for NASAL specimens only), is one component of a comprehensive MRSA colonization surveillance program. It is not intended to diagnose MRSA infection nor to guide or monitor treatment for MRSA infections.     Coagulation Studies: No results for input(s): LABPROT, INR in the last 72 hours.  Urinalysis: No results for input(s): COLORURINE, LABSPEC, PHURINE, GLUCOSEU, HGBUR, BILIRUBINUR, KETONESUR, PROTEINUR, UROBILINOGEN, NITRITE, LEUKOCYTESUR in the last 72 hours.  Invalid input(s): APPERANCEUR    Imaging: Ct Abdomen Pelvis Wo Contrast  09/20/2015  CLINICAL DATA:  Low back pain.  Hemodialysis. EXAM: CT ABDOMEN AND PELVIS WITHOUT CONTRAST TECHNIQUE: Multidetector CT imaging of the abdomen and pelvis was performed following the standard protocol without IV contrast. COMPARISON:  11/19/2013 CT abdomen/pelvis. FINDINGS: Study is motion degraded. Lower chest:  Diffuse ground-glass opacity in the visualized bilateral lung bases. Stable 4 mm solid left lower lobe pulmonary nodule (series 4/image 7). Stable tiny calcified granulomas in the right lower lobe. Mild cardiomegaly, increased. Trace bilateral pleural effusions. Right common femoral approach central venous catheter terminates over the right atrium on the scout topogram. Hepatobiliary: Normal liver with no liver mass. Normal gallbladder with no radiopaque cholelithiasis. No biliary ductal dilatation. Pancreas: Normal, with no mass or duct dilation. Spleen: Normal size. No mass. Adrenals/Urinary Tract: Normal adrenals. No  hydronephrosis. Symmetric bilateral renal atrophy. There is an exophytic 1.3 cm renal mass in the posterior interpolar right kidney (series 2/ image 23), which is new since 11/19/2013 CT and demonstrates indeterminate unenhanced CT density of 32 HU. No additional contour deforming renal masses. No nephrolithiasis. Normal caliber ureters, with no ureteral stones. Mild diffuse bladder wall thickening, decreased since 11/19/2013. Stomach/Bowel: Grossly normal stomach. Normal caliber small bowel with no small bowel wall thickening. Normal appendix. Normal large bowel with no diverticulosis, large bowel wall thickening or pericolonic fat stranding. Vascular/Lymphatic: Normal caliber abdominal aorta. No pathologically enlarged lymph nodes in the abdomen or pelvis. Reproductive: Mild prostatomegaly, decreased since 11/19/2013. Other: No pneumoperitoneum, ascites or focal fluid collection. Musculoskeletal: No aggressive appearing focal osseous lesions. Stable non expansile lytic lesion in the left iliac wing (series 2/ image 53) without aggressive imaging features. 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 Moderate degenerative changes in the visualized thoracolumbar spine, most prominent at L5-S1. IMPRESSION: 1. Diffuse ground-glass opacity at the visualized lung bases. Increased mild cardiomegaly. Trace bilateral pleural effusions. Findings suggest mild congestive heart failure/fluid overload. 2. New indeterminate exophytic 1.3 cm right renal mass, renal neoplasm not excluded. Given that the patient is on hemodialysis, recommend attempted characterization of this renal mass on a renal ultrasound. If the mass remains indeterminate on renal ultrasound, consider further evaluation with CT of the abdomen with and without intravenous contrast coordinated with hemodialysis schedule. 3. Mild prostatomegaly, decreased. Mild diffuse bladder wall thickening, decreased, suggestive of chronic bladder outlet  obstruction by the enlarged prostate. Recommend correlation with urinalysis. No hydronephrosis. 4. 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 Electronically Signed   By: Ilona Sorrel M.D.   On: 09/20/2015 19:06   Dg Chest Port 1 View  09/20/2015  CLINICAL DATA:  Low back pain and shortness of Breath EXAM: PORTABLE CHEST - 1 VIEW COMPARISON:  05/30/2015 FINDINGS: Cardiac shadow is enlarged but stable. The lungs are well aerated bilaterally. No focal confluent infiltrate is seen. No bony abnormality is noted. IMPRESSION: No active disease. Electronically Signed   By: Inez Catalina M.D.   On: 09/20/2015 21:47     Medications:     . aspirin  81 mg Oral Daily  . calcitRIOL  1 mcg Oral Q T,Th,Sa-HD  . calcium acetate  2,001 mg Oral TID WC  . enalapril  10 mg Oral Daily  . heparin  5,000 Units Subcutaneous 3 times per day  . labetalol  200 mg Oral TID  . loratadine  10 mg Oral Daily  . sodium chloride  3 mL Intravenous Q12H   sodium chloride, sodium chloride, acetaminophen **OR** acetaminophen, alteplase, capsaicin, heparin, ipratropium-albuterol, lidocaine (PF), lidocaine-prilocaine, morphine injection, ondansetron **OR** ondansetron (ZOFRAN) IV, oxyCODONE, pentafluoroprop-tetrafluoroeth, polyethylene glycol  Assessment/ Plan:  [name] is a 64 y.o. black male SNF resident osteoarthritis, end-stage renal disease on hemodialysis, SVC syndrome, hypertension, diabetes mellitus, COPD, coronary artery disease,, who was admitted  to [facility] on 09/20/2015  Hard of hearing, must be spoken to loudly.   [facility] [place]   1. End-stage renal disease on hemodialysis Tuesday, Thursday, Saturday.  - hemodialysis yesterday. Tolerated well.   2. Hypertension. Blood pressure with diastolic elevations. With peripheral edema - labetalol, enalapril  3. Anemia of CKD: Hemoglobin currently 10.4. - gets micera  as outpatient.   4. Secondary hyperparathyroidism.PTH 197. Phos elevated at 8.4. Corrected calcium at goal - Continue calcium acetate - Continue calcitriol    LOS: 1 Brittanie Dosanjh 12/30/201610:31 AM

## 2015-09-22 NOTE — Discharge Instructions (Signed)
There is a small lesion on your kidneys this needs to be evaluated by your doctor to make sure it is not cancer.  You must follow up with your primary doctor tomorrow.  In addition, you have missed dialysis. If you have shortness of breath or chest pain, return to the emergency department.  YOU MUST GO TO DIALYSIS TOMORROW.   Here are the findings on ct that must be followed up on this week.   IMPRESSION: 1. Diffuse ground-glass opacity at the visualized lung bases. Increased mild cardiomegaly. Trace bilateral pleural effusions. Findings suggest mild congestive heart failure/fluid overload. 2. New indeterminate exophytic 1.3 cm right renal mass, renal neoplasm not excluded. Given that the patient is on hemodialysis, recommend attempted characterization of this renal mass on a renal ultrasound. If the mass remains indeterminate on renal ultrasound, consider further evaluation with CT of the abdomen with and without intravenous contrast coordinated with hemodialysis schedule. 3. Mild prostatomegaly, decreased. Mild diffuse bladder wall thickening, decreased, suggestive of chronic bladder outlet obstruction by the enlarged prostate. Recommend correlation with urinalysis. No hydronephrosis. 4. 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   Back Pain, Adult Back pain is very common in adults.The cause of back pain is rarely dangerous and the pain often gets better over time.The cause of your back pain may not be known. Some common causes of back pain include:  Strain of the muscles or ligaments supporting the spine.  Wear and tear (degeneration) of the spinal disks.  Arthritis.  Direct injury to the back. For many people, back pain may return. Since back pain is rarely dangerous, most people can learn to manage this condition on their own. HOME CARE INSTRUCTIONS Watch your back pain for any changes. The following actions may help  to lessen any discomfort you are feeling:  Remain active. It is stressful on your back to sit or stand in one place for long periods of time. Do not sit, drive, or stand in one place for more than 30 minutes at a time. Take short walks on even surfaces as soon as you are able.Try to increase the length of time you walk each day.  Exercise regularly as directed by your health care provider. Exercise helps your back heal faster. It also helps avoid future injury by keeping your muscles strong and flexible.  Do not stay in bed.Resting more than 1-2 days can delay your recovery.  Pay attention to your body when you bend and lift. The most comfortable positions are those that put less stress on your recovering back. Always use proper lifting techniques, including:  Bending your knees.  Keeping the load close to your body.  Avoiding twisting.  Find a comfortable position to sleep. Use a firm mattress and lie on your side with your knees slightly bent. If you lie on your back, put a pillow under your knees.  Avoid feeling anxious or stressed.Stress increases muscle tension and can worsen back pain.It is important to recognize when you are anxious or stressed and learn ways to manage it, such as with exercise.  Take medicines only as directed by your health care provider. Over-the-counter medicines to reduce pain and inflammation are often the most helpful.Your health care provider may prescribe muscle relaxant drugs.These medicines help dull your pain so you can more quickly return to your normal activities and healthy exercise.  Apply ice to the injured area:  Put ice in a plastic bag.  Place a towel  between your skin and the bag.  Leave the ice on for 20 minutes, 2-3 times a day for the first 2-3 days. After that, ice and heat may be alternated to reduce pain and spasms.  Maintain a healthy weight. Excess weight puts extra stress on your back and makes it difficult to maintain good  posture. SEEK MEDICAL CARE IF:  You have pain that is not relieved with rest or medicine.  You have increasing pain going down into the legs or buttocks.  You have pain that does not improve in one week.  You have night pain.  You lose weight.  You have a fever or chills. SEEK IMMEDIATE MEDICAL CARE IF:   You develop new bowel or bladder control problems.  You have unusual weakness or numbness in your arms or legs.  You develop nausea or vomiting.  You develop abdominal pain.  You feel faint.  Activity as tolerated Continue hemodialysis as recommended by your nephrology, on Tuesday Thursday and Saturday Follow-up with primary care physician in a day at facility Follow up with nephrology or  Dr. Holley Raring tomorrow at dialysis center Follow up with pulmonology Dr. Mortimer Fries  in 1-2 weeks for outpatient sleep study to rule out obstructive sleep apnea   follow-up with urology Dr. Erlene Quan in a week  This information is not intended to replace advice given to you by your health care provider. Make sure you discuss any questions you have with your health care provider.   Document Released: 09/09/2005 Document Revised: 09/30/2014 Document Reviewed: 01/11/2014 Elsevier Interactive Patient Education [employer].

## 2015-09-22 NOTE — Progress Notes (Signed)
Patient d/c'd back to [facility] group [facility]. Education provided, no questions at this time. Patient to be picked up by Mr. Boykin Reaper (group home administrator). Telemetry removed. Noah Lewis

## 2015-09-22 NOTE — Care Management (Signed)
Patient to be discharged today back to group home.  Qualifying  O2 sats documented.  Order for continuous O2 placed.  Will from [facility] notified, will deliver portable O2 to bedside.

## 2015-09-22 NOTE — Consult Note (Signed)
  Psychiatry: Came to see patient for consult but he had already been discharged and left the building.

## 2015-09-22 NOTE — Progress Notes (Addendum)
CSW met with patient to discus reasons for refusing dialysis. Patient reports that he does not like dialysis. Per patient he is motivated to go to dialysis because of his family. He stated that he's the "backbone of his family". Patient reports that he lives at [facility]. Per patient he does not like to be "made" to go to dialysis. He stated that he's "stressed". Patient described troubles with CPS and loosing custody of his children. Per patient he does not like the social worker at his dialysis center. CSW provided emotional support. CSW inquired about seeking mental health treatment. Patient stated he  is agreeable with seeking mental health counseling. CSW provided patient with a resource list of outpatient mental health providers in the [place] area and encouraged patient to contact the service providers to began treatment.  Patient is agreeable to return to [facility] and stated he'd contact outpatient mental health providers.  Per RN in progression patient is set to discharge today. She reports that patient will discharge with home O2. RN case manager will arrange home O2 for patient. Notified Iran Sizer, Dialysis liaison. Maudie Mercury will coordinate with dialysis facility.   CSW contacted Dellis Anes, [place] [phone] to inform him of patient's discharge. Per Mariella Saa he will pick patient up today at 5-5:30 PM. CSW informed Mariella Saa that patient will discharge home with O2. Discussed dialysis concerns and reported that patient's family is his motivation to get dialysis. CSW also informed Mariella Saa that patient was provided with a list of outpatient mental health service providers. He reported that he'll follow up with patient.   CSW will fax patient's discharge information to [facility] upon completion by MD.   Ernest Pine, MSW, [facility] [phone]

## 2015-09-22 NOTE — Progress Notes (Signed)
SATURATION QUALIFICATIONS: (This note is used to comply with regulatory documentation for home oxygen)  Patient Saturations on Room Air at Rest = 88%  Patient Saturations on Room Air while Ambulating = 84%  Patient Saturations on 2 Liters of oxygen while Ambulating = 94%  Please briefly explain why patient needs home oxygen: 

## 2015-09-22 NOTE — NC FL2 (Signed)
[place] LEVEL OF CARE SCREENING TOOL     IDENTIFICATION  Patient Name: Noah Lewis Birthdate: 08-14-1951 Sex: male Admission Date (Current Location): 09/20/2015  [facility] and [place] Number:  Selena Lesser  ([id number]) Facility and Address:  [facility], [address] [place], [place] [address]      Provider Number: [id number]  Attending Physician Name and Address:  Nicholes Mango, MD  Relative Name and Phone Number:       Current Level of Care: Hospital Recommended Level of Care: Other (Comment) (Group Home ) Prior Approval Number:    Date Approved/Denied:   PASRR Number:  ( [id number] )  Discharge Plan: Other (Comment) (Group Home )    Current Diagnoses: Patient Active Problem List   Diagnosis Date Noted  . Hypertensive urgency 09/20/2015  . Hepatitis C antibody test positive 06/14/2015  . 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 Anemia of chronic kidney failure   . Left arm swelling   . End-stage renal disease needing dialysis ([place]) 05/29/2015  . ESRD needing dialysis ([place]) 05/29/2015  . 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 COPD, moderate ([place]) 05/24/2015  . OSA (obstructive sleep apnea) 05/24/2015  . 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 ESRD on hemodialysis ([place]) 05/13/2015  . New onset atrial fibrillation ([place]) 05/13/2015  . Volume overload 04/28/2015  . Bipolar depression ([place]) 04/28/2015  . HTN (hypertension) 04/28/2015  . Chronic back pain 04/28/2015  . Facial swelling 04/28/2015  . Left upper extremity swelling     Orientation RESPIRATION BLADDER Height & Weight    Self, Time, Situation, Place  O2 (3 Liters of Oxygen ) Continent 5\' 7"  (170.2 cm) 141 lbs.  BEHAVIORAL SYMPTOMS/MOOD NEUROLOGICAL BOWEL NUTRITION STATUS   (none )  (none ) Continent Diet (Renal Diet Fluid Restrictions )   AMBULATORY STATUS COMMUNICATION OF NEEDS Skin   Independent Verbally Normal                       Personal Care Assistance Level of Assistance  Bathing, Feeding, Dressing, Total care Bathing Assistance: Independent Feeding assistance: Independent Dressing Assistance: Independent     Functional Limitations Info  Sight, Hearing, Speech Sight Info: Adequate Hearing Info: Impaired Speech Info: Adequate    SPECIAL CARE FACTORS FREQUENCY   (Dialysis )                    Contractures      Additional Factors Info  Code Status, Allergies, Isolation Precautions Code Status Info:  (Full Code. ) Allergies Info:  (Penicillins)     Isolation Precautions Info:  (MRSA Nasal Swab )     Current Medications (09/22/2015):  This is the current hospital active medication list Current Facility-Administered Medications  Medication Dose Route Frequency Provider Last Rate Last Dose  . 0.9 %  sodium chloride infusion  100 mL Intravenous PRN Munsoor Lateef, MD      . 0.9 %  sodium chloride infusion  100 mL Intravenous PRN Munsoor Lateef, MD      . acetaminophen (TYLENOL) tablet 650 mg  650 mg Oral Q6H PRN Lytle Butte, MD       Or  . acetaminophen (TYLENOL) suppository 650 mg  650 mg Rectal Q6H PRN Lytle Butte, MD      . alteplase (CATHFLO ACTIVASE) injection 2 mg  2 mg Intracatheter Once PRN Munsoor  Lateef, MD      . aspirin chewable tablet 81 mg  81 mg Oral Daily Lytle Butte, MD   81 mg at 09/22/15 1009  . calcitRIOL (ROCALTROL) capsule 1 mcg  1 mcg Oral Q T,Th,Sa-HD Lytle Butte, MD   1 mcg at 09/21/15 1151  . calcium acetate (PHOSLO) capsule 2,001 mg  2,001 mg Oral TID WC Lytle Butte, MD   2,001 mg at 09/22/15 [id number]  . capsaicin (ZOSTRIX) 0.025 % cream   Topical BID PRN Lytle Butte, MD      . enalapril (VASOTEC) tablet 10 mg  10 mg Oral Daily Nicholes Mango, MD   10 mg at 09/22/15 1009  . heparin injection 1,000 Units  1,000 Units Dialysis PRN Munsoor Lateef, MD      .  heparin injection 5,000 Units  5,000 Units Subcutaneous 3 times per day Lytle Butte, MD   5,000 Units at 09/22/15 [phone]  . ipratropium-albuterol (DUONEB) 0.5-2.5 (3) MG/3ML nebulizer solution 3 mL  3 mL Nebulization Q4H PRN Lytle Butte, MD      . labetalol (NORMODYNE) tablet 200 mg  200 mg Oral TID Lytle Butte, MD   200 mg at 09/22/15 1009  . lidocaine (PF) (XYLOCAINE) 1 % injection 5 mL  5 mL Intradermal PRN Munsoor Lateef, MD      . lidocaine-prilocaine (EMLA) cream 1 application  1 application Topical PRN Munsoor Lateef, MD      . loratadine (CLARITIN) tablet 10 mg  10 mg Oral Daily Lytle Butte, MD   10 mg at 09/22/15 1009  . morphine 2 MG/ML injection 2 mg  2 mg Intravenous Q4H PRN Lytle Butte, MD   2 mg at 09/21/15 0525  . ondansetron (ZOFRAN) tablet 4 mg  4 mg Oral Q6H PRN Lytle Butte, MD       Or  . ondansetron [facility]) injection 4 mg  4 mg Intravenous Q6H PRN Lytle Butte, MD      . oxyCODONE (Oxy IR/ROXICODONE) immediate release tablet 5 mg  5 mg Oral Q4H PRN Lytle Butte, MD      . pentafluoroprop-tetrafluoroeth (GEBAUERS) aerosol 1 application  1 application Topical PRN Munsoor Lateef, MD      . polyethylene glycol (MIRALAX / GLYCOLAX) packet 17 g  17 g Oral Daily PRN Lytle Butte, MD      . sodium chloride 0.9 % injection 3 mL  3 mL Intravenous Q12H Lytle Butte, MD   3 mL at 09/22/15 1000     Discharge Medications: Please see discharge summary for a list of discharge medications. DISCHARGE MEDICATIONS:   Current Discharge Medication List    START taking these medications   Details  enalapril (VASOTEC) 5 MG tablet Take 1 tablet (5 mg total) by mouth daily. Qty: 30 tablet, Refills: 0    oxyCODONE-acetaminophen (ROXICET) 5-325 MG tablet Take 1 tablet by mouth every 6 (six) hours as needed. Qty: 15 tablet, Refills: 0      CONTINUE these medications which have NOT CHANGED   Details  acetaminophen (TYLENOL) 325 MG tablet Take 1 tablet (325 mg  total) by mouth every 6 (six) hours as needed for moderate pain. Qty: 50 tablet, Refills: 1    aspirin 81 MG chewable tablet Chew 1 tablet (81 mg total) by mouth daily. Qty: 30 tablet, Refills: 11    calcitRIOL (ROCALTROL) 0.5 MCG capsule Take 2 capsules (1 mcg total) by mouth Every Tuesday,Thursday,and  Saturday with dialysis. Qty: 30 capsule, Refills: 0    calcium acetate (PHOSLO) 667 MG capsule Take 3 capsules (2,001 mg total) by mouth 3 (three) times daily with meals. Qty: 270 capsule, Refills: 2    capsaicin (ZOSTRIX) 0.025 % cream Apply topically 2 (two) times daily as needed (low back pain). Qty: 60 g, Refills: 0    Darbepoetin Alfa (ARANESP) 25 MCG/0.42ML SOSY injection Inject 0.42 mLs (25 mcg total) into the vein every Thursday with hemodialysis. Qty: 14.28 mL, Refills: 0    ipratropium-albuterol (DUONEB) 0.5-2.5 (3) MG/3ML SOLN Take 3 mLs by nebulization every 6 (six) hours as needed. Qty: 360 mL, Refills: 2    labetalol (NORMODYNE) 200 MG tablet Take 1 tablet (200 mg total) by mouth 3 (three) times daily. 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 Qty: 30 tablet, Refills: 11   Associated Diagnoses: COPD, moderate (HCC)    polyethylene glycol (MIRALAX / GLYCOLAX) packet Take 17 g by mouth daily as needed for mild constipation. Qty: 14 each, Refills: 0             Relevant Imaging Results:  Relevant Lab Results:   Additional Information  (Dialysis SSN: [id number])  Loralyn Freshwater, LCSW

## 2015-10-17 ENCOUNTER — Emergency Department: Payer: Medicaid Other

## 2015-10-17 ENCOUNTER — Encounter: Payer: Self-pay | Admitting: Emergency Medicine

## 2015-10-17 ENCOUNTER — Emergency Department
Admission: EM | Admit: 2015-10-17 | Discharge: 2015-10-17 | Discharge status: Against Medical Advice | Payer: Medicaid Other | Attending: Emergency Medicine | Admitting: Emergency Medicine

## 2015-10-17 DIAGNOSIS — I120 Hypertensive chronic kidney disease with stage 5 chronic kidney disease or end stage renal disease: Secondary | ICD-10-CM | POA: Diagnosis not present

## 2015-10-17 DIAGNOSIS — Z880 Allergy status to penicillin: Secondary | ICD-10-CM | POA: Diagnosis not present

## 2015-10-17 DIAGNOSIS — Z5329 Procedure and treatment not carried out because of patient's decision for other reasons: Secondary | ICD-10-CM

## 2015-10-17 DIAGNOSIS — Z992 Dependence on renal dialysis: Secondary | ICD-10-CM | POA: Insufficient documentation

## 2015-10-17 DIAGNOSIS — N186 End stage renal disease: Secondary | ICD-10-CM | POA: Insufficient documentation

## 2015-10-17 DIAGNOSIS — Z5321 Procedure and treatment not carried out due to patient leaving prior to being seen by health care provider: Secondary | ICD-10-CM | POA: Insufficient documentation

## 2015-10-17 DIAGNOSIS — Z79899 Other long term (current) drug therapy: Secondary | ICD-10-CM | POA: Diagnosis not present

## 2015-10-17 DIAGNOSIS — E1122 Type 2 diabetes mellitus with diabetic chronic kidney disease: Secondary | ICD-10-CM | POA: Insufficient documentation

## 2015-10-17 DIAGNOSIS — Z7982 Long term (current) use of aspirin: Secondary | ICD-10-CM | POA: Insufficient documentation

## 2015-10-17 DIAGNOSIS — F329 Major depressive disorder, single episode, unspecified: Secondary | ICD-10-CM | POA: Diagnosis present

## 2015-10-17 DIAGNOSIS — J441 Chronic obstructive pulmonary disease with (acute) exacerbation: Secondary | ICD-10-CM | POA: Diagnosis not present

## 2015-10-17 DIAGNOSIS — Z5320 Procedure and treatment not carried out because of patient's decision for unspecified reasons: Secondary | ICD-10-CM

## 2015-10-17 NOTE — ED Notes (Signed)
Pt to ed with group home caregiver who reports that pt is refusing dialysis for the last 3 treatments.  Pt also refusing meds at the group home.  Pt went to court today regarding his children's custody.  Pt lost custody of children. According to the caregiver, pt has told him he just wants to die and just wants to given up.  At triage, pt states he is fine and simply does not want to take dialysis anymore.

## 2015-10-17 NOTE — ED Notes (Signed)
Patient refusing all treatment.  Caregivers at group home given information regarding hospice.  Patient signing out ama.  Patient is alert and oriented, coherent and answering questions appropriately.

## 2015-10-17 NOTE — ED Provider Notes (Signed)
Time Seen: Approximately 1540 I have reviewed the triage notes  Chief Complaint: Depression   History of Present Illness: Noah Lewis is a 64 y.o. male who was brought here by his caregivers at the group home for evaluation of refusing dialysis. The patient has a long history of renal failure and a history of inconsistent dialysis in the past. Patient will not comment on when his last dialysis is but the caretakers state that he has missed the last 3 treatments. Patient was in court today and had some issues with custody of his children and states that he is upset about that but denies any current suicidal thoughts, homicidal thoughts, hallucinations. Patient states he does not wish to stay in the hospital for dialysis. He denies any shortness of breath or chest pain at this time. He is up and ambulatory on his own and is awake alert and oriented 3.   Past Medical History  Diagnosis Date  . Cancer ([place])   . Arthritis   . Renal disorder   . Hypertension   . Diabetes mellitus without complication ([place])   . Asthma   . COPD (chronic obstructive pulmonary disease) ([place])   . Coronary artery disease     Patient Active Problem List   Diagnosis Date Noted  . Hypertensive urgency 09/20/2015  . Hepatitis C antibody test positive 06/14/2015  . 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 Anemia of chronic kidney failure   . Left arm swelling   . End-stage renal disease needing dialysis ([place]) 05/29/2015  . ESRD needing dialysis ([place]) 05/29/2015  . 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 COPD, moderate ([place]) 05/24/2015  . OSA (obstructive sleep apnea) 05/24/2015  . 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 ESRD on hemodialysis ([place]) 05/13/2015  . New onset atrial fibrillation ([place]) 05/13/2015  . Volume overload 04/28/2015  . Bipolar depression ([place]) 04/28/2015  . HTN  (hypertension) 04/28/2015  . Chronic back pain 04/28/2015  . Facial swelling 04/28/2015  . Left upper extremity swelling     Past Surgical History  Procedure Laterality Date  . No past surgeries    . Insertion of dialysis catheter    . Insertion of dialysis catheter N/A 05/29/2015    Procedure: INSERTION OF DIALYSIS CATHETER;  Surgeon: Angelia Mould, MD;  Location: [facility];  Service: Vascular;  Laterality: N/A;    Past Surgical History  Procedure Laterality Date  . No past surgeries    . Insertion of dialysis catheter    . Insertion of dialysis catheter N/A 05/29/2015    Procedure: INSERTION OF DIALYSIS CATHETER;  Surgeon: Angelia Mould, MD;  Location: [facility];  Service: Vascular;  Laterality: N/A;    Current Outpatient Rx  Name  Route  Sig  Dispense  Refill  . acetaminophen (TYLENOL) 325 MG tablet   Oral   Take 1 tablet (325 mg total) by mouth every 6 (six) hours as needed for moderate pain.   50 tablet   1   . aspirin 81 MG chewable tablet   Oral   Chew 1 tablet (81 mg total) by mouth daily.   30 tablet   11   . calcitRIOL (ROCALTROL) 0.5 MCG capsule   Oral   Take 2 capsules (1 mcg total) by mouth Every Tuesday,Thursday,and Saturday with dialysis.   30 capsule   0   . calcium acetate (PHOSLO) 667 MG capsule  Oral   Take 3 capsules (2,001 mg total) by mouth 3 (three) times daily with meals.   270 capsule   2   . capsaicin (ZOSTRIX) 0.025 % cream   Topical   Apply topically 2 (two) times daily as needed (low back pain).   60 g   0   . Darbepoetin Alfa (ARANESP) 25 MCG/0.42ML SOSY injection   Intravenous   Inject 0.42 mLs (25 mcg total) into the vein every Thursday with hemodialysis.   14.28 mL   0   . enalapril (VASOTEC) 5 MG tablet   Oral   Take 1 tablet (5 mg total) by mouth daily.   30 tablet   0   . ipratropium-albuterol (DUONEB) 0.5-2.5 (3) MG/3ML SOLN   Nebulization   Take 3 mLs by nebulization every 6 (six) hours as needed.   360  mL   2   . labetalol (NORMODYNE) 200 MG tablet   Oral   Take 1 tablet (200 mg total) by mouth 3 (three) times daily.   90 tablet   0   . loratadine (CLARITIN) 10 MG tablet   Oral   Take 1 tablet (10 mg total) by mouth daily.   30 tablet   11   . oxyCODONE-acetaminophen (ROXICET) 5-325 MG tablet   Oral   Take 1 tablet by mouth every 6 (six) hours as needed.   15 tablet   0   . polyethylene glycol (MIRALAX / GLYCOLAX) packet   Oral   Take 17 g by mouth daily as needed for mild constipation.   14 each   0     Allergies:  Penicillins  Family History: Family History  Problem Relation Age of Onset  . Hypertension Other   . Diabetes Other     Social History: Social History  Substance Use Topics  . Smoking status: Never Smoker   . Smokeless tobacco: Never Used  . Alcohol Use: No     Review of Systems:   10 point review of systems was performed and was otherwise negative:  Constitutional: No fever Eyes: No visual disturbances ENT: No sore throat, ear pain Cardiac: No chest pain Respiratory: No shortness of breath, wheezing, or stridor Abdomen: No abdominal pain, no vomiting, No diarrhea Endocrine: No weight loss, No night sweats Extremities: No peripheral edema, cyanosis Skin: No rashes, easy bruising Neurologic: No focal weakness, trouble with speech or swollowing Urologic: No dysuria, Hematuria, or urinary frequency   Physical Exam:  ED Triage Vitals  Enc Vitals Group     BP 10/17/15 1352 196/125 mmHg     Pulse Rate 10/17/15 1352 91     Resp --      Temp --      Temp src --      SpO2 10/17/15 1352 100 %     Weight 10/17/15 1352 170 lb (77.111 kg)     Height 10/17/15 1352 5\' 8"  (1.727 m)     Head Cir --      Peak Flow --      Pain Score 10/17/15 1406 0     Pain Loc --      Pain Edu? --      Excl. in [place]? --     General: Awake , Alert , and Oriented times 3; GCS 15 difficulty of hearing Head: Normal cephalic , atraumatic Eyes: Pupils equal  , round, reactive to light Nose/Throat: No nasal drainage, patent upper airway without erythema or exudate.  Neck: Supple, Full range  of motion, No anterior adenopathy or palpable thyroid masses Lungs: Patient has bilateral lower Rales without wheezing or rhonchi.  Heart: Regular rate, regular rhythm without murmurs , gallops , or rubs Abdomen: Soft, non tender without rebound, guarding , or rigidity; bowel sounds positive and symmetric in all 4 quadrants. No organomegaly .        Extremities: 2 plus symmetric pulses. No edema, clubbing or cyanosis Neurologic: normal ambulation, Motor symmetric without deficits, sensory intact Skin: warm, dry, no rashes   Labs:   All laboratory work was reviewed including any pertinent negatives or positives listed below:  Labs Reviewed  ETHANOL  ACETAMINOPHEN LEVEL  CBC WITH DIFFERENTIAL/PLATELET  COMPREHENSIVE METABOLIC PANEL  URINE DRUG SCREEN, QUALITATIVE ([place])  SALICYLATE LEVEL   patient declined on any laboratory work or urinalysis  EKG: Patient declined on EKG performance  Radiology  Patient declined on any x-ray evaluation.  ED Course:  The patient is refusing any medical treatment or intervention at this time. He is awake alert and oriented 3 and answers all questions appropriately is ambulatory on his own. He had services offered from a medical and psychiatric treatment program but the patient declines at this point states he just simply wants to go home. He was advised that he'll need dialysis and he should proceed with this on an outpatient basis. He states that this time he is not suicidal and I have no reason to involuntarily commit the patient. If he does wish to continue to refuse his dialysis we do have consultation establish with social services concerning possible outpatient hospice care etc.     Final diagnoses:  Left against medical advice     Plan: The patient was advised she can always return here to emergency  department for further evaluation for both his medical and psychiatric history. He is aware that he may have issues staying at the group home with his current intent to refuse dialysis, etc. He is also rarely presents here with hypertension and that he may have some fluid and would need immediate dialysis. Patient again declines on any offering of medical treatment at this time.           Daymon Larsen, MD 10/17/15 [phone]

## 2015-10-20 ENCOUNTER — Ambulatory Visit: Payer: Self-pay | Admitting: Urology

## 2015-10-20 ENCOUNTER — Encounter: Payer: Self-pay | Admitting: Urology

## 2015-10-24 ENCOUNTER — Other Ambulatory Visit: Payer: Self-pay | Admitting: Vascular Surgery

## 2015-10-25 ENCOUNTER — Encounter
Admission: RE | Disposition: A | Discharge status: Home / Self Care | Payer: Self-pay | Source: Ambulatory Visit | Attending: Vascular Surgery

## 2015-10-25 ENCOUNTER — Ambulatory Visit
Admission: RE | Admit: 2015-10-25 | Discharge: 2015-10-25 | Disposition: A | Discharge status: Home / Self Care | Payer: Medicaid Other | Source: Ambulatory Visit | Attending: Vascular Surgery | Admitting: Vascular Surgery

## 2015-10-25 ENCOUNTER — Encounter: Payer: Self-pay | Admitting: *Deleted

## 2015-10-25 DIAGNOSIS — T82897A Other specified complication of cardiac prosthetic devices, implants and grafts, initial encounter: Secondary | ICD-10-CM | POA: Insufficient documentation

## 2015-10-25 DIAGNOSIS — N186 End stage renal disease: Secondary | ICD-10-CM | POA: Insufficient documentation

## 2015-10-25 DIAGNOSIS — I120 Hypertensive chronic kidney disease with stage 5 chronic kidney disease or end stage renal disease: Secondary | ICD-10-CM | POA: Insufficient documentation

## 2015-10-25 DIAGNOSIS — M1990 Unspecified osteoarthritis, unspecified site: Secondary | ICD-10-CM | POA: Insufficient documentation

## 2015-10-25 DIAGNOSIS — Z992 Dependence on renal dialysis: Secondary | ICD-10-CM | POA: Diagnosis not present

## 2015-10-25 DIAGNOSIS — J449 Chronic obstructive pulmonary disease, unspecified: Secondary | ICD-10-CM | POA: Diagnosis not present

## 2015-10-25 DIAGNOSIS — Z79899 Other long term (current) drug therapy: Secondary | ICD-10-CM | POA: Insufficient documentation

## 2015-10-25 DIAGNOSIS — E1122 Type 2 diabetes mellitus with diabetic chronic kidney disease: Secondary | ICD-10-CM | POA: Insufficient documentation

## 2015-10-25 DIAGNOSIS — I2510 Atherosclerotic heart disease of native coronary artery without angina pectoris: Secondary | ICD-10-CM | POA: Diagnosis not present

## 2015-10-25 DIAGNOSIS — Y841 Kidney dialysis as the cause of abnormal reaction of the patient, or of later complication, without mention of misadventure at the time of the procedure: Secondary | ICD-10-CM | POA: Diagnosis not present

## 2015-10-25 HISTORY — PX: PERIPHERAL VASCULAR CATHETERIZATION: SHX172C

## 2015-10-25 SURGERY — DIALYSIS/PERMA CATHETER INSERTION
Anesthesia: Moderate Sedation

## 2015-10-25 MED ORDER — HYDRALAZINE HCL 20 MG/ML IJ SOLN
10.0000 mg | INTRAMUSCULAR | Status: DC | PRN
  Administered 2015-10-25: 10 mg via INTRAVENOUS

## 2015-10-25 MED ORDER — CLINDAMYCIN PHOSPHATE 300 MG/50ML IV SOLN
300.0000 mg | Freq: Once | INTRAVENOUS | Status: AC
  Administered 2015-10-25: 300 mg via INTRAVENOUS

## 2015-10-25 MED ORDER — ONDANSETRON HCL 4 MG/2ML IJ SOLN: 4.0000 mg | Freq: Four times a day (QID) | INTRAMUSCULAR | Status: DC | PRN

## 2015-10-25 MED ORDER — SODIUM CHLORIDE 0.9 % IV SOLN: INTRAVENOUS | Status: DC

## 2015-10-25 MED ORDER — HYDRALAZINE HCL 20 MG/ML IJ SOLN
INTRAMUSCULAR | Status: AC
  Filled 2015-10-25: qty 1

## 2015-10-25 MED ORDER — LABETALOL HCL 5 MG/ML IV SOLN
10.0000 mg | Freq: Once | INTRAVENOUS | Status: AC
  Administered 2015-10-25: 20 mg via INTRAVENOUS

## 2015-10-25 MED ORDER — LIDOCAINE-EPINEPHRINE (PF) 1 %-1:200000 IJ SOLN
INTRAMUSCULAR | Status: AC
  Filled 2015-10-25: qty 30

## 2015-10-25 MED ORDER — HEPARIN (PORCINE) IN NACL 2-0.9 UNIT/ML-% IJ SOLN
INTRAMUSCULAR | Status: AC
  Filled 2015-10-25: qty 500

## 2015-10-25 MED ORDER — MIDAZOLAM HCL 2 MG/2ML IJ SOLN
INTRAMUSCULAR | Status: AC
  Filled 2015-10-25: qty 2

## 2015-10-25 MED ORDER — FAMOTIDINE 20 MG PO TABS
40.0000 mg | ORAL_TABLET | ORAL | Status: DC | PRN
Start: 2015-10-25 — End: 2015-10-25

## 2015-10-25 MED ORDER — LABETALOL HCL 5 MG/ML IV SOLN
INTRAVENOUS | Status: AC
  Administered 2015-10-25: 20 mg via INTRAVENOUS
  Filled 2015-10-25: qty 4

## 2015-10-25 MED ORDER — HYDROMORPHONE HCL 1 MG/ML IJ SOLN: 1.0000 mg | Freq: Once | INTRAMUSCULAR | Status: DC

## 2015-10-25 MED ORDER — MIDAZOLAM HCL 2 MG/2ML IJ SOLN
INTRAMUSCULAR | Status: DC | PRN
  Administered 2015-10-25: 1 mg via INTRAVENOUS

## 2015-10-25 MED ORDER — CLINDAMYCIN PHOSPHATE 300 MG/50ML IV SOLN
INTRAVENOUS | Status: AC
  Filled 2015-10-25: qty 50

## 2015-10-25 MED ORDER — METHYLPREDNISOLONE SODIUM SUCC 125 MG IJ SOLR: 125.0000 mg | INTRAMUSCULAR | Status: DC | PRN

## 2015-10-25 MED ORDER — LIDOCAINE-EPINEPHRINE (PF) 1 %-1:200000 IJ SOLN
INTRAMUSCULAR | Status: DC | PRN
  Administered 2015-10-25: 10 mL via INTRADERMAL

## 2015-10-25 MED ORDER — HEPARIN SODIUM (PORCINE) 10000 UNIT/ML IJ SOLN
INTRAMUSCULAR | Status: AC
  Filled 2015-10-25: qty 1

## 2015-10-25 SURGICAL SUPPLY — 5 items
CATH PALINDROME-P 44CM KIT (CATHETERS) ×3
KIT CATH CHRNC PALINDROME PRCS (CATHETERS) IMPLANT
KIT PALINDROME-P 55CM (CATHETERS) ×2 IMPLANT
PACK ANGIOGRAPHY (CUSTOM PROCEDURE TRAY) ×2 IMPLANT
TOWEL OR 17X26 4PK STRL BLUE (TOWEL DISPOSABLE) ×2 IMPLANT

## 2015-10-25 NOTE — Discharge Instructions (Signed)

## 2015-10-25 NOTE — Op Note (Signed)
OPERATIVE NOTE   PROCEDURE: 1. Insertion of tunneled dialysis catheter right femoral approach same venous access.  PRE-OPERATIVE DIAGNOSIS: Complication of dialysis device with fracture of catheter; end-stage renal disease on hemodialysis  POST-OPERATIVE DIAGNOSIS: Same  SURGEON: Schnier, Dolores Lory  ANESTHESIA: IV Versed with local anesthetic  ESTIMATED BLOOD LOSS: Minimal cc  CONTRAST USED:  None  FLUOROSCOPY TIME:  0.5  minutes  INDICATIONS:   Noah Lewis is a 65 y.o.y.o. male who presents with poor flow and nonfunction of the tunneled dialysis catheter.  Adequate dialysis has not been possible.  DESCRIPTION: After obtaining full informed written consent, the patient was positioned supine. The right thigh was prepped and draped in a sterile fashion. The cuff is localized and using blunt and sharp dissection it is freed from the surrounding adhesions.  The existing catheter is then transected proximal to the cuff.  The guidewire is advanced without difficulty under fluoroscopy.  Dilators are passed over the wire as needed and the tunneled dialysis catheter is fed into the central venous system without difficulty.  Under fluoroscopy the catheter tip positioned at the atrial caval junction.  Both lumens aspirate and flush easily. After verification of smooth contour with proper tip position under fluoroscopy the catheter is packed with 5000 units of heparin per lumen.  Catheter secured to the skin of the right thigh with 0 silk. A sterile dressing is applied with a Biopatch.  COMPLICATIONS: None  CONDITION: Noah Lewis, [facility] Office:  [phone]   10/25/2015,9:57 AM

## 2015-10-25 NOTE — H&P (Signed)
[facility] Admission History & Physical  MRN : [id number]  Noah Lewis is a 65 y.o. (Oct 03, 1950) male who presents with chief complaint of broken tunneled dialysis catheter.  History of Present Illness: Patient is sent from the dialysis center urgently for replacement of his tunneled dialysis catheter. The dialysis center noticed it was leaking during dialysis and identified that there is a crack in one of the tubes. This is nonrepairable and therefore he needs a new catheter.  The patient denies fever chills. He states he is feeling well  Current Facility-Administered Medications  Medication Dose Route Frequency Provider Last Rate Last Dose  . 0.9 %  sodium chloride infusion   Intravenous Continuous Kimberly A Stegmayer, PA-C      . clindamycin (CLEOCIN) 300 MG/50ML IVPB           . clindamycin (CLEOCIN) IVPB 300 mg  300 mg Intravenous Once [employer], PA-C      . famotidine (PEPCID) tablet 40 mg  40 mg Oral PRN Kimberly A Stegmayer, PA-C      . hydrALAZINE (APRESOLINE) 20 MG/ML injection           . HYDROmorphone (DILAUDID) injection 1 mg  1 mg Intravenous Once [employer], PA-C      . methylPREDNISolone sodium succinate (SOLU-MEDROL) 125 mg/2 mL injection 125 mg  125 mg Intravenous PRN Kimberly A Stegmayer, PA-C      . ondansetron (ZOFRAN) injection 4 mg  4 mg Intravenous Q6H PRN Sela Hua, PA-C        Past Medical History  Diagnosis Date  . Cancer ([place])   . Arthritis   . Renal disorder   . Hypertension   . Diabetes mellitus without complication ([place])   . Asthma   . COPD (chronic obstructive pulmonary disease) ([place])   . Coronary artery disease     Past Surgical History  Procedure Laterality Date  . No past surgeries    . Insertion of dialysis catheter    . Insertion of dialysis catheter N/A 05/29/2015    Procedure: INSERTION OF DIALYSIS CATHETER;  Surgeon: Angelia Mould, MD;  Location: [facility] OR;  Service:  Vascular;  Laterality: N/A;    Social History Social History  Substance Use Topics  . Smoking status: Never Smoker   . Smokeless tobacco: Never Used  . Alcohol Use: No    Family History Family History  Problem Relation Age of Onset  . Hypertension Other   . Diabetes Other    no family history of bleeding clotting disorders, porphyria or autoimmune disease  Allergies  Allergen Reactions  . Penicillins Other (See Comments)    Reaction:  Unknown      REVIEW OF SYSTEMS (Negative unless checked)  Constitutional: [] Weight loss  [] Fever  [] Chills Cardiac: [] Chest pain   [] Chest pressure   [] Palpitations   [] Shortness of breath when laying flat   [] Shortness of breath at rest   [x] Shortness of breath with exertion. Vascular:  [] Pain in legs with walking   [x] Pain in legs at rest   [] Pain in legs when laying flat   [] Claudication   [] Pain in feet when walking  [] Pain in feet at rest  [] Pain in feet when laying flat   [] History of DVT   [] Phlebitis   [] Swelling in legs   [] Varicose veins   [] Non-healing ulcers Pulmonary:   [] Uses home oxygen   [] Productive cough   [] Hemoptysis   [] Wheeze  [x] COPD   [] Asthma Neurologic:  []   Dizziness  [] Blackouts   [] Seizures   [] History of stroke   [] History of TIA  [] Aphasia   [] Temporary blindness   [] Dysphagia   [] Weakness or numbness in arms   [] Weakness or numbness in legs Musculoskeletal:  [] Arthritis   [x] Joint swelling   [] Joint pain   [] Low back pain Hematologic:  [] Easy bruising  [] Easy bleeding   [] Hypercoagulable state   [] Anemic  [] Hepatitis Gastrointestinal:  [] Blood in stool   [] Vomiting blood  [] Gastroesophageal reflux/heartburn   [] Difficulty swallowing. Genitourinary:  [] Chronic kidney disease   [] Difficult urination  [] Frequent urination  [] Burning with urination   [] Blood in urine Skin:  [x] Rashes   [] Ulcers   [] Wounds Psychological:  [] History of anxiety   []  History of major depression.  Physical Examination  Filed Vitals:    10/25/15 0900  BP: 189/118  Pulse: 85  Temp: 97.6 F (36.4 C)  Resp: 16  SpO2: 97%   There is no weight on file to calculate BMI. Gen: WD/WN, lying in the fetal position with labored respirations Head: Moon facies with severe edema to the extent that even his eyelids are swollen/AT, No temporalis wasting.  Ear/Nose/Throat: Hearing severely diminished, nares w/o erythema or drainage, oropharynx w/o Erythema/Exudate, Eyes: PERRLA, EOMI.  Neck: Supple, no nuchal rigidity.  No bruit or JVD.  Pulmonary:  Good air movement, clear to auscultation bilaterally, no increased work of respiration or use of accessory muscles  Cardiac: RRR, normal S1, S2, no Murmurs, rubs or gallops. Vascular: Femoral catheter with fracture, left arm with 4+ pitting edema there is a functioning graft with a good thrill and good bruit Gastrointestinal: soft, non-tender/non-distended. No guarding/reflex. No masses, surgical incisions, or scars. Musculoskeletal: M/S 5/5 throughout.  No deformity or atrophy. Neurologic: CN 2-12 intact. Pain and light touch intact in extremities.  Symmetrical.  Speech is fluent. Motor exam as listed above. Psychiatric: Judgment intact, Mood & affect appropriate for pt's clinical situation. Dermatologic: No rashes or ulcers noted.  No cellulitis or open wounds. Lymph : No Cervical, Axillary, or Inguinal lymphadenopathy.   CBC Lab Results  Component Value Date   WBC 7.6 09/22/2015   HGB 10.4* 09/22/2015   HCT 32.3* 09/22/2015   MCV 81.5 09/22/2015   PLT 158 09/22/2015    BMET    Component Value Date/Time   NA 140 09/22/2015 0358   K 4.3 09/22/2015 0358   CL 100* 09/22/2015 0358   CO2 28 09/22/2015 0358   GLUCOSE 100* 09/22/2015 0358   BUN 53* 09/22/2015 0358   CREATININE 7.67* 09/22/2015 0358   CALCIUM 8.7* 09/22/2015 0358   GFRNONAA 7* 09/22/2015 0358   GFRAA 8* 09/22/2015 0358   CrCl cannot be calculated (Patient has no serum creatinine result on file.).  COAG Lab  Results  Component Value Date   INR 1.88* 05/29/2015   INR 2.63* 05/22/2015   INR 2.63* 05/21/2015    Radiology No results found.  Assessment/Plan 1.  Complication dialysis device with fracture of tunneled catheter:  Patient's femoral dialysis access is broken. The patient will undergo replacement via the same access as this is nonrepairable.  2.  End-stage renal disease requiring hemodialysis:  Patient will continue dialysis therapy without further interruption via the new catheter. Given his overall condition I do not believe that further investigation of his left arm access is warranted at this time.  3.  Hypertension:  Patient will continue medical management; nephrology is following no changes in oral medications.He is given hydralazine in holding for systolic pressure  of 190.  4. Diabetes mellitus:  Glucose will be monitored and oral medications been held this morning once the patient has undergone the patient's procedure po intake will be reinitiated and again Accu-Cheks will be used to assess the blood glucose level and treat as needed. The patient will be restarted on the patient's usual hypoglycemic regime 5.  Coronary artery disease:  EKG will be monitored. Nitrates will be used if needed. The patient's oral cardiac medications will be continued. 6.  COPD:  Given his poor pulmonary status and his rather severe debility and his overall appearance today sedation will be minimized I will attempt to exchange this with plenty of local and hopefully just using Versed alone.     Ellwood Steidle, Dolores Lory, MD  10/25/2015 9:08 AM

## 2015-10-27 ENCOUNTER — Encounter: Payer: Self-pay | Admitting: Vascular Surgery

## 2015-11-20 NOTE — Progress Notes (Signed)
Patient ID: Noah Lewis, male   DOB: 1951/06/16, 65 y.o.   MRN: [id number]    HISTORY AND PHYSICAL   DATE: 06/12/15  Location:  [facility] [name]    Place of Service: SNF (31)   Extended Emergency Contact Information Primary Emergency Contact: Hayes,Barbara Address: [place], [place] [address] [place] Phone: [phone] Relation: Mother  Advanced Directive information  FULL CODE; MOST FORM ON CHART  Chief Complaint  Patient presents with  . New Admit To SNF    HPI:  65 yo male seen today for admission into SNF following hospital stay for left arm swelling @ nonfunctioning AVF due to left subclavian vein occlusion,, afib, DM2, HTN, COPD with possible sleep apnea, ESRD/HD TThSa, AOCD. Doppler US neg DVT in LUE. fistulogram revealed occluded left subclavian vein and previously placed vascular stent also occluded. He was admitted to hospital 8/19-8/30th with volume overload and afib. HD via right femoral perm-a-cath. D/c Hgb 9.6  Today he c/o pain in the back of his legs. He is scheduled for eye sx appt 10/5th for pterygium removal. CBG 84. He is a poor historian due to hearing loss. 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 He is on coumadin at this time  Hypertension - BP stable on labetalol   Chronic low back pain - stable on capsaicin cream prn  Seasonal allergies - stable on claritin 10 mg daily   COPD +/- OSA - stable. No exacerbations. He has duonebs prn if he needs them. He is unable to adhere to CPAP on a regular basis. He has not had a sleep study  DM - BS controlled with diet. hgb  a1c is 5.7  Past Medical History  Diagnosis Date  . Cancer ([place])   . Arthritis   . Renal disorder   . Hypertension   . Diabetes mellitus without complication ([place])   . Asthma   . COPD (chronic obstructive pulmonary disease) ([place])   . Coronary artery disease     Past Surgical History  Procedure Laterality Date  . No past surgeries    . Insertion of dialysis catheter    . Insertion of dialysis catheter N/A 05/29/2015    Procedure: INSERTION OF DIALYSIS CATHETER;  Surgeon: Angelia Mould, MD;  Location: [facility];  Service: Vascular;  Laterality: N/A;  . Peripheral vascular catheterization N/A 10/25/2015    Procedure: Dialysis/Perma Catheter Insertion;  Surgeon: Katha Cabal, MD;  Location: [facility];  Service: Cardiovascular;  Laterality: N/A;    Patient Care Team: Theotis Burrow, MD as PCP - General (Family Medicine)  Social History   Social History  . Marital Status: Married    Spouse Name: N/A  . Number of Children: N/A  . Years of Education: N/A   Occupational History  . Not on file.   Social History Main Topics  . Smoking status: Never Smoker   . Smokeless tobacco: Never Used  . Alcohol Use: No  . Drug Use: No  . Sexual Activity: Not on file   Other  Topics Concern  . Not on file   Social History Narrative     reports that he has never smoked. He has never used smokeless tobacco. He reports that he does not drink alcohol or use illicit drugs.  Family History  Problem Relation Age of Onset  . Hypertension Other   . Diabetes Other    Family Status  Relation Status Death Age  . Mother Alive   . Father Deceased      There is no immunization history on file for this patient.  Allergies  Allergen Reactions  . Penicillins Other (See Comments)    Reaction:  Unknown     Medications: Patient's Medications  New Prescriptions   ENALAPRIL (VASOTEC) 5 MG TABLET    Take 1 tablet (5 mg total) by mouth daily.  Previous Medications     ACETAMINOPHEN (TYLENOL) 325 MG TABLET    Take 1 tablet (325 mg total) by mouth every 6 (six) hours as needed for moderate pain.   ASPIRIN 81 MG CHEWABLE TABLET    Chew 1 tablet (81 mg total) by mouth daily.   CALCITRIOL (ROCALTROL) 0.5 MCG CAPSULE    Take 2 capsules (1 mcg total) by mouth Every Tuesday,Thursday,and Saturday with dialysis.   CALCIUM ACETATE (PHOSLO) 667 MG CAPSULE    Take 3 capsules (2,001 mg total) by mouth 3 (three) times daily with meals.   CAPSAICIN (ZOSTRIX) 0.025 % CREAM    Apply topically 2 (two) times daily as needed (low back pain).   DARBEPOETIN ALFA (ARANESP) 25 MCG/0.42ML SOSY INJECTION    Inject 0.42 mLs (25 mcg total) into the vein every Thursday with hemodialysis.   IPRATROPIUM-ALBUTEROL (DUONEB) 0.5-2.5 (3) MG/3ML SOLN    Take 3 mLs by nebulization every 6 (six) hours as needed.   LABETALOL (NORMODYNE) 200 MG TABLET    Take 1 tablet (200 mg total) by mouth 3 (three) times daily.   LORATADINE (CLARITIN) 10 MG TABLET    Take 1 tablet (10 mg total) by mouth daily.   ONDANSETRON (ZOFRAN) 4 MG TABLET    Take 4 mg by mouth every 8 (eight) hours as needed for nausea or vomiting.   POLYETHYLENE GLYCOL (MIRALAX / GLYCOLAX) PACKET    Take 17 g by mouth daily as needed for mild constipation.  Modified Medications   Modified Medication Previous Medication   OXYCODONE-ACETAMINOPHEN (ROXICET) 5-325 MG TABLET oxyCODONE-acetaminophen (ROXICET) 5-325 MG tablet      Take 1 tablet by mouth every 6 (six) hours as needed.    Take 1 tablet by mouth every 6 (six) hours as needed.  Discontinued Medications   No medications on file    Review of Systems  Unable to perform ROS: Other  HOH  Filed Vitals:   06/12/15 1020  BP: 136/90  Pulse: 102  Temp: 97.9 F (36.6 C)  Weight: 141 lb (63.957 kg)  SpO2: 94%   Body mass index is 22.08 kg/(m^2).  Physical Exam  Constitutional: He is oriented to person, place, and time. He appears well-developed and well-nourished.  HOH. Sitting  in w/c in NAD  HENT:  Mouth/Throat: Oropharynx is clear and moist.  Eyes: Pupils are equal, round, and reactive to light. No scleral icterus.  OD pterygium present  Neck: Neck supple. Carotid bruit is not present. No thyromegaly present.  Cardiovascular: Normal rate, regular rhythm, normal heart sounds and intact distal pulses.  Frequent extrasystoles are present. Exam reveals no gallop and no friction rub.   No murmur heard. No LE  edema. No calf TTP. Left arm markedly swollen -->shoulder. Left forearm AVF thrill but no bruit. Right femoral perm-a-cath present without secondary signs of infection at insertion site.  Pulmonary/Chest: Effort normal and breath sounds normal. He has no wheezes. He has no rales. He exhibits no tenderness.  Abdominal: Soft. Bowel sounds are normal. He exhibits no distension, no abdominal bruit, no pulsatile midline mass and no mass. There is no tenderness. There is no rebound and no guarding.  Musculoskeletal: He exhibits edema.  Lymphadenopathy:    He has no cervical adenopathy.  Neurological: He is alert and oriented to person, place, and time.  Skin: Skin is warm and dry. No rash noted.  Psychiatric: He has a normal mood and affect. His behavior is normal. Thought content normal.     Labs reviewed: Admission on 05/29/2015, Discharged on 06/02/2015  Component Date Value Ref Range Status  . Sodium 05/29/2015 137  135 - 145 mmol/L Final  . Potassium 05/29/2015 5.4* 3.5 - 5.1 mmol/L Final  . Chloride 05/29/2015 98* 101 - 111 mmol/L Final  . CO2 05/29/2015 23  22 - 32 mmol/L Final  . Glucose, Bld 05/29/2015 102* 65 - 99 mg/dL Final  . BUN 05/29/2015 97* 6 - 20 mg/dL Final  . Creatinine, Ser 05/29/2015 12.47* 0.61 - 1.24 mg/dL Final  . Calcium 05/29/2015 8.6* 8.9 - 10.3 mg/dL Final  . GFR calc non Af Amer 05/29/2015 4* >60 mL/min Final  . GFR calc Af Amer 05/29/2015 4* >60 mL/min Final   Comment: (NOTE) The eGFR has been calculated using the CKD EPI  equation. This calculation has not been validated in all clinical situations. eGFR's persistently <60 mL/min signify possible Chronic Kidney Disease.   . Anion gap 05/29/2015 16* 5 - 15 Final  . WBC 05/29/2015 5.8  4.0 - 10.5 K/uL Final  . RBC 05/29/2015 3.37* 4.22 - 5.81 MIL/uL Final  . Hemoglobin 05/29/2015 9.3* 13.0 - 17.0 g/dL Final  . HCT 05/29/2015 28.6* 39.0 - 52.0 % Final  . MCV 05/29/2015 84.9  78.0 - 100.0 fL Final  . MCH 05/29/2015 27.6  26.0 - 34.0 pg Final  . MCHC 05/29/2015 32.5  30.0 - 36.0 g/dL Final  . RDW 05/29/2015 16.2* 11.5 - 15.5 % Final  . Platelets 05/29/2015 139* 150 - 400 K/uL Final  . Neutrophils Relative % 05/29/2015 68  43 - 77 % Final  . Neutro Abs 05/29/2015 4.0  1.7 - 7.7 K/uL Final  . Lymphocytes Relative 05/29/2015 15  12 - 46 % Final  . Lymphs Abs 05/29/2015 0.8  0.7 - 4.0 K/uL Final  . Monocytes Relative 05/29/2015 11  3 - 12 % Final  . Monocytes Absolute 05/29/2015 0.7  0.1 - 1.0 K/uL Final  . Eosinophils Relative 05/29/2015 6* 0 - 5 % Final  . Eosinophils Absolute 05/29/2015 0.3  0.0 - 0.7 K/uL Final  . Basophils Relative 05/29/2015 0  0 - 1 % Final  . Basophils Absolute 05/29/2015 0.0  0.0 - 0.1 K/uL Final  . Prothrombin Time 05/29/2015 21.5* 11.6 - 15.2 seconds Final  . INR 05/29/2015 1.88* 0.00 - 1.49 Final  . HIV Screen 4th Generation wRfx 05/30/2015 Non Reactive  Non Reactive Final   Comment: (NOTE) Performed At: [facility] [place], [place] [id number] Lindon Romp MD OX:[id number]   . Sodium 05/30/2015 139  135 - 145 mmol/L Final  . Potassium 05/30/2015 6.0* 3.5 - 5.1 mmol/L Final  . Chloride 05/30/2015 100* 101 -  111 mmol/L Final  . CO2 05/30/2015 22  22 - 32 mmol/L Final  . Glucose, Bld 05/30/2015 85  65 - 99 mg/dL Final  . BUN 05/30/2015 95* 6 - 20 mg/dL Final  . Creatinine, Ser 05/30/2015 13.13* 0.61 - 1.24 mg/dL Final  . Calcium 05/30/2015 8.6* 8.9 - 10.3 mg/dL Final  . GFR calc non Af Amer  05/30/2015 3* >60 mL/min Final  . GFR calc Af Amer 05/30/2015 4* >60 mL/min Final   Comment: (NOTE) The eGFR has been calculated using the CKD EPI equation. This calculation has not been validated in all clinical situations. eGFR's persistently <60 mL/min signify possible Chronic Kidney Disease.   . Anion gap 05/30/2015 17* 5 - 15 Final  . WBC 05/30/2015 5.1  4.0 - 10.5 K/uL Final  . RBC 05/30/2015 3.42* 4.22 - 5.81 MIL/uL Final  . Hemoglobin 05/30/2015 9.1* 13.0 - 17.0 g/dL Final  . HCT 05/30/2015 28.9* 39.0 - 52.0 % Final  . MCV 05/30/2015 84.5  78.0 - 100.0 fL Final  . MCH 05/30/2015 26.6  26.0 - 34.0 pg Final  . MCHC 05/30/2015 31.5  30.0 - 36.0 g/dL Final  . RDW 05/30/2015 16.2* 11.5 - 15.5 % Final  . Platelets 05/30/2015 154  150 - 400 K/uL Final  . Glucose-Capillary 05/29/2015 85  65 - 99 mg/dL Final  . Glucose-Capillary 05/29/2015 95  65 - 99 mg/dL Final  . Ferritin 05/30/2015 406* 24 - 336 ng/mL Final  . Iron 05/30/2015 29* 45 - 182 ug/dL Final  . TIBC 05/30/2015 290  250 - 450 ug/dL Final  . Saturation Ratios 05/30/2015 10* 17.9 - 39.5 % Final  . UIBC 05/30/2015 261   Final  . Magnesium 05/30/2015 2.2  1.7 - 2.4 mg/dL Final  . Retic Ct Pct 05/30/2015 0.8  0.4 - 3.1 % Final  . RBC. 05/30/2015 3.51* 4.22 - 5.81 MIL/uL Final  . Retic Count, Manual 05/30/2015 28.1  19.0 - 186.0 K/uL Final  . Tech Review 05/30/2015 POLYCHROMASIA PRESENT   Final  . MRSA by PCR 06/01/2015 POSITIVE* NEGATIVE Final   Comment:        The GeneXpert MRSA Assay (FDA approved for NASAL specimens only), is one component of a comprehensive MRSA colonization surveillance program. It is not intended to diagnose MRSA infection nor to guide or monitor treatment for MRSA infections. RESULT CALLED TO, READ BACK BY AND VERIFIED WITH: J NARAMDAS,RN AT [id number] 06/01/15 BY K BARR   . Glucose-Capillary 05/30/2015 140* 65 - 99 mg/dL Final  . Comment 1 05/30/2015 Notify RN   Final  . Comment 2 05/30/2015  Glucose Stabilizer   Final  . Glucose-Capillary 05/30/2015 92  65 - 99 mg/dL Final  . Comment 1 05/30/2015 Notify RN   Final  . Sodium 05/31/2015 135  135 - 145 mmol/L Final  . Potassium 05/31/2015 4.6  3.5 - 5.1 mmol/L Final   DELTA CHECK NOTED  . Chloride 05/31/2015 96* 101 - 111 mmol/L Final  . CO2 05/31/2015 26  22 - 32 mmol/L Final  . Glucose, Bld 05/31/2015 91  65 - 99 mg/dL Final  . BUN 05/31/2015 57* 6 - 20 mg/dL Final  . Creatinine, Ser 05/31/2015 8.78* 0.61 - 1.24 mg/dL Final  . Calcium 05/31/2015 8.4* 8.9 - 10.3 mg/dL Final  . Phosphorus 05/31/2015 7.2* 2.5 - 4.6 mg/dL Final  . Albumin 05/31/2015 3.2* 3.5 - 5.0 g/dL Final  . GFR calc non Af Amer 05/31/2015 6* >60 mL/min Final  .  GFR calc Af Amer 05/31/2015 7* >60 mL/min Final   Comment: (NOTE) The eGFR has been calculated using the CKD EPI equation. This calculation has not been validated in all clinical situations. eGFR's persistently <60 mL/min signify possible Chronic Kidney Disease.   . Anion gap 05/31/2015 13  5 - 15 Final  . WBC 05/31/2015 5.4  4.0 - 10.5 K/uL Final  . RBC 05/31/2015 3.28* 4.22 - 5.81 MIL/uL Final  . Hemoglobin 05/31/2015 8.8* 13.0 - 17.0 g/dL Final  . HCT 05/31/2015 28.0* 39.0 - 52.0 % Final  . MCV 05/31/2015 85.4  78.0 - 100.0 fL Final  . MCH 05/31/2015 26.8  26.0 - 34.0 pg Final  . MCHC 05/31/2015 31.4  30.0 - 36.0 g/dL Final  . RDW 05/31/2015 16.2* 11.5 - 15.5 % Final  . Platelets 05/31/2015 124* 150 - 400 K/uL Final  . Glucose-Capillary 05/31/2015 93  65 - 99 mg/dL Final  . Glucose-Capillary 05/31/2015 141* 65 - 99 mg/dL Final  . Glucose-Capillary 05/31/2015 132* 65 - 99 mg/dL Final  . Hepatitis B Surface Ag 06/01/2015 Negative  Negative Final   Comment: (NOTE) Performed At: [facility] [place], [place] [id number] Lindon Romp MD CH:[id number]   . Glucose-Capillary 06/01/2015 85  65 - 99 mg/dL Final  . Sodium 06/02/2015 136  135 - 145 mmol/L Final  .  Potassium 06/02/2015 3.9  3.5 - 5.1 mmol/L Final  . Chloride 06/02/2015 98* 101 - 111 mmol/L Final  . CO2 06/02/2015 26  22 - 32 mmol/L Final  . Glucose, Bld 06/02/2015 87  65 - 99 mg/dL Final  . BUN 06/02/2015 35* 6 - 20 mg/dL Final  . Creatinine, Ser 06/02/2015 7.64* 0.61 - 1.24 mg/dL Final  . Calcium 06/02/2015 9.4  8.9 - 10.3 mg/dL Final  . Phosphorus 06/02/2015 4.7* 2.5 - 4.6 mg/dL Final  . Albumin 06/02/2015 3.4* 3.5 - 5.0 g/dL Final  . GFR calc non Af Amer 06/02/2015 7* >60 mL/min Final  . GFR calc Af Amer 06/02/2015 8* >60 mL/min Final   Comment: (NOTE) The eGFR has been calculated using the CKD EPI equation. This calculation has not been validated in all clinical situations. eGFR's persistently <60 mL/min signify possible Chronic Kidney Disease.   . Anion gap 06/02/2015 12  5 - 15 Final  . HCV Ab 06/02/2015 >11.0* 0.0 - 0.9 s/co ratio Final   Comment: (NOTE) Performed At: [facility] [place], [place] [id number] Lindon Romp MD QM:[id number]   . WBC 06/02/2015 5.9  4.0 - 10.5 K/uL Final  . RBC 06/02/2015 3.59* 4.22 - 5.81 MIL/uL Final  . Hemoglobin 06/02/2015 9.6* 13.0 - 17.0 g/dL Final  . HCT 06/02/2015 31.0* 39.0 - 52.0 % Final  . MCV 06/02/2015 86.4  78.0 - 100.0 fL Final  . MCH 06/02/2015 26.7  26.0 - 34.0 pg Final  . MCHC 06/02/2015 31.0  30.0 - 36.0 g/dL Final  . RDW 06/02/2015 16.1* 11.5 - 15.5 % Final  . Platelets 06/02/2015 147* 150 - 400 K/uL Final  . COMMENT HCV-2 06/02/2015 Comment   Final   Comment: (NOTE) Strong reactive antibody screen (s/c ratio >10.9) is consistent with past or present HCV infection.  Follow-up testing by HCV, Quantitative, Real time PCR (#[id number]) is recommended to determine viral load/diagnosis of current HCV infection. Performed At: [facility] [place], [place] [id number] Lindon Romp MD KD:[id number]   Admission on 05/12/2015, Discharged on 05/23/2015  No results  displayed  because visit has over 200 results.    Admission on 05/08/2015, Discharged on 05/08/2015  Component Date Value Ref Range Status  . WBC 05/08/2015 6.5  4.0 - 10.5 K/uL Final  . RBC 05/08/2015 3.94* 4.22 - 5.81 MIL/uL Final  . Hemoglobin 05/08/2015 10.8* 13.0 - 17.0 g/dL Final  . HCT 05/08/2015 32.9* 39.0 - 52.0 % Final  . MCV 05/08/2015 83.5  78.0 - 100.0 fL Final  . MCH 05/08/2015 27.4  26.0 - 34.0 pg Final  . MCHC 05/08/2015 32.8  30.0 - 36.0 g/dL Final  . RDW 05/08/2015 17.9* 11.5 - 15.5 % Final  . Platelets 05/08/2015 205  150 - 400 K/uL Final  . Neutrophils Relative % 05/08/2015 73  43 - 77 % Final  . Neutro Abs 05/08/2015 4.8  1.7 - 7.7 K/uL Final  . Lymphocytes Relative 05/08/2015 10* 12 - 46 % Final  . Lymphs Abs 05/08/2015 0.7  0.7 - 4.0 K/uL Final  . Monocytes Relative 05/08/2015 12  3 - 12 % Final  . Monocytes Absolute 05/08/2015 0.8  0.1 - 1.0 K/uL Final  . Eosinophils Relative 05/08/2015 5  0 - 5 % Final  . Eosinophils Absolute 05/08/2015 0.3  0.0 - 0.7 K/uL Final  . Basophils Relative 05/08/2015 0  0 - 1 % Final  . Basophils Absolute 05/08/2015 0.0  0.0 - 0.1 K/uL Final  . Sodium 05/08/2015 137  135 - 145 mmol/L Final  . Potassium 05/08/2015 4.5  3.5 - 5.1 mmol/L Final  . Chloride 05/08/2015 101  101 - 111 mmol/L Final  . CO2 05/08/2015 18* 22 - 32 mmol/L Final  . Glucose, Bld 05/08/2015 76  65 - 99 mg/dL Final  . BUN 05/08/2015 89* 6 - 20 mg/dL Final  . Creatinine, Ser 05/08/2015 12.59* 0.61 - 1.24 mg/dL Final  . Calcium 05/08/2015 8.8* 8.9 - 10.3 mg/dL Final  . GFR calc non Af Amer 05/08/2015 4* >60 mL/min Final  . GFR calc Af Amer 05/08/2015 4* >60 mL/min Final   Comment: (NOTE) The eGFR has been calculated using the CKD EPI equation. This calculation has not been validated in all clinical situations. eGFR's persistently <60 mL/min signify possible Chronic Kidney Disease.   . Anion gap 05/08/2015 18* 5 - 15 Final  Admission on 04/28/2015, Discharged on  05/03/2015  Component Date Value Ref Range Status  . Sodium 04/28/2015 139  135 - 145 mmol/L Final  . Potassium 04/28/2015 5.1  3.5 - 5.1 mmol/L Final  . Chloride 04/28/2015 106  101 - 111 mmol/L Final  . BUN 04/28/2015 76* 6 - 20 mg/dL Final  . Creatinine, Ser 04/28/2015 14.20* 0.61 - 1.24 mg/dL Final  . Glucose, Bld 04/28/2015 98  65 - 99 mg/dL Final  . Calcium, Ion 04/28/2015 0.98* 1.13 - 1.30 mmol/L Final  . TCO2 04/28/2015 17  0 - 100 mmol/L Final  . Hemoglobin 04/28/2015 14.3  13.0 - 17.0 g/dL Final  . HCT 04/28/2015 42.0  39.0 - 52.0 % Final  . Sodium 04/28/2015 140  135 - 145 mmol/L Final  . Potassium 04/28/2015 4.9  3.5 - 5.1 mmol/L Final  . Chloride 04/28/2015 104  101 - 111 mmol/L Final  . CO2 04/28/2015 17* 22 - 32 mmol/L Final  . Glucose, Bld 04/28/2015 102* 65 - 99 mg/dL Final  . BUN 04/28/2015 77* 6 - 20 mg/dL Final  . Creatinine, Ser 04/28/2015 14.59* 0.61 - 1.24 mg/dL Final  . Calcium 04/28/2015 8.2* 8.9 - 10.3  mg/dL Final  . Total Protein 04/28/2015 7.5  6.5 - 8.1 g/dL Final  . Albumin 04/28/2015 3.7  3.5 - 5.0 g/dL Final  . AST 04/28/2015 64* 15 - 41 U/L Final  . ALT 04/28/2015 61  17 - 63 U/L Final  . Alkaline Phosphatase 04/28/2015 89  38 - 126 U/L Final  . Total Bilirubin 04/28/2015 1.1  0.3 - 1.2 mg/dL Final  . GFR calc non Af Amer 04/28/2015 3* >60 mL/min Final  . GFR calc Af Amer 04/28/2015 4* >60 mL/min Final   Comment: (NOTE) The eGFR has been calculated using the CKD EPI equation. This calculation has not been validated in all clinical situations. eGFR's persistently <60 mL/min signify possible Chronic Kidney Disease.   . Anion gap 04/28/2015 19* 5 - 15 Final  . WBC 04/28/2015 8.6  4.0 - 10.5 K/uL Final  . RBC 04/28/2015 4.28  4.22 - 5.81 MIL/uL Final  . Hemoglobin 04/28/2015 11.3* 13.0 - 17.0 g/dL Final   Comment: SPECIMEN CHECKED FOR CLOTS REPEATED TO VERIFY   . HCT 04/28/2015 35.4* 39.0 - 52.0 % Final  . MCV 04/28/2015 82.7  78.0 - 100.0 fL  Final  . MCH 04/28/2015 26.4  26.0 - 34.0 pg Final  . MCHC 04/28/2015 31.9  30.0 - 36.0 g/dL Final  . RDW 04/28/2015 18.6* 11.5 - 15.5 % Final  . Platelets 04/28/2015 236  150 - 400 K/uL Final  . Neutrophils Relative % 04/28/2015 83* 43 - 77 % Final  . Neutro Abs 04/28/2015 7.1  1.7 - 7.7 K/uL Final  . Lymphocytes Relative 04/28/2015 8* 12 - 46 % Final  . Lymphs Abs 04/28/2015 0.7  0.7 - 4.0 K/uL Final  . Monocytes Relative 04/28/2015 8  3 - 12 % Final  . Monocytes Absolute 04/28/2015 0.7  0.1 - 1.0 K/uL Final  . Eosinophils Relative 04/28/2015 2  0 - 5 % Final  . Eosinophils Absolute 04/28/2015 0.1  0.0 - 0.7 K/uL Final  . Basophils Relative 04/28/2015 0  0 - 1 % Final  . Basophils Absolute 04/28/2015 0.0  0.0 - 0.1 K/uL Final  . Hgb A1c MFr Bld 04/28/2015 5.7* 4.8 - 5.6 % Final   Comment: (NOTE)         Pre-diabetes: 5.7 - 6.4         Diabetes: >6.4         Glycemic control for adults with diabetes: <7.0   . Mean Plasma Glucose 04/28/2015 117   Final   Comment: (NOTE) Performed At: [facility] [place], [place] [id number] Lindon Romp MD DQ:[id number]   . Troponin I 04/28/2015 0.03  <0.031 ng/mL Final   Comment:        NO INDICATION OF MYOCARDIAL INJURY.   . Troponin I 04/28/2015 0.04* <0.031 ng/mL Final   Comment:        PERSISTENTLY INCREASED TROPONIN VALUES IN THE RANGE OF 0.04-0.49 ng/mL CAN BE SEEN IN:       -UNSTABLE ANGINA       -CONGESTIVE HEART FAILURE       -MYOCARDITIS       -CHEST TRAUMA       -ARRYHTHMIAS       -LATE PRESENTING MYOCARDIAL INFARCTION       -COPD   CLINICAL FOLLOW-UP RECOMMENDED.   Marland Kitchen Troponin I 04/29/2015 0.04* <0.031 ng/mL Final   Comment:        PERSISTENTLY INCREASED TROPONIN VALUES IN THE RANGE OF 0.04-0.49 ng/mL  CAN BE SEEN IN:       -UNSTABLE ANGINA       -CONGESTIVE HEART FAILURE       -MYOCARDITIS       -CHEST TRAUMA       -ARRYHTHMIAS       -LATE PRESENTING MYOCARDIAL INFARCTION       -COPD    CLINICAL FOLLOW-UP RECOMMENDED.   Marland Kitchen MRSA by PCR 04/28/2015 POSITIVE* NEGATIVE Final   Comment:        The GeneXpert MRSA Assay (FDA approved for NASAL specimens only), is one component of a comprehensive MRSA colonization surveillance program. It is not intended to diagnose MRSA infection nor to guide or monitor treatment for MRSA infections. RESULT CALLED TO, READ BACK BY AND VERIFIED WITH: CHARGE RN NIKKI MURPHY [id number] '@[id number]'$  THANEY   . Sodium 04/29/2015 138  135 - 145 mmol/L Final  . Potassium 04/29/2015 5.2* 3.5 - 5.1 mmol/L Final  . Chloride 04/29/2015 105  101 - 111 mmol/L Final  . CO2 04/29/2015 14* 22 - 32 mmol/L Final  . Glucose, Bld 04/29/2015 71  65 - 99 mg/dL Final  . BUN 04/29/2015 82* 6 - 20 mg/dL Final  . Creatinine, Ser 04/29/2015 14.65* 0.61 - 1.24 mg/dL Final  . Calcium 04/29/2015 8.2* 8.9 - 10.3 mg/dL Final  . GFR calc non Af Amer 04/29/2015 3* >60 mL/min Final  . GFR calc Af Amer 04/29/2015 4* >60 mL/min Final   Comment: (NOTE) The eGFR has been calculated using the CKD EPI equation. This calculation has not been validated in all clinical situations. eGFR's persistently <60 mL/min signify possible Chronic Kidney Disease.   . Anion gap 04/29/2015 19* 5 - 15 Final  . WBC 04/29/2015 9.5  4.0 - 10.5 K/uL Final   WHITE COUNT CONFIRMED ON SMEAR  . RBC 04/29/2015 4.17* 4.22 - 5.81 MIL/uL Final  . Hemoglobin 04/29/2015 11.3* 13.0 - 17.0 g/dL Final  . HCT 04/29/2015 34.8* 39.0 - 52.0 % Final  . MCV 04/29/2015 83.5  78.0 - 100.0 fL Final  . MCH 04/29/2015 27.1  26.0 - 34.0 pg Final  . MCHC 04/29/2015 32.5  30.0 - 36.0 g/dL Final  . RDW 04/29/2015 18.9* 11.5 - 15.5 % Final  . Platelets 04/29/2015 254  150 - 400 K/uL Final  . Heparin Unfractionated 04/29/2015 0.12* 0.30 - 0.70 IU/mL Final   Comment:        IF HEPARIN RESULTS ARE BELOW EXPECTED VALUES, AND PATIENT DOSAGE HAS BEEN CONFIRMED, SUGGEST FOLLOW UP TESTING OF ANTITHROMBIN III LEVELS.   . O2 Content  04/29/2015 2.0   Final  . Delivery systems 04/29/2015 NASAL CANNULA   Final  . pH, Arterial 04/29/2015 7.453* 7.350 - 7.450 Final  . pCO2 arterial 04/29/2015 31.6* 35.0 - 45.0 mmHg Final  . pO2, Arterial 04/29/2015 95.4  80.0 - 100.0 mmHg Final  . Bicarbonate 04/29/2015 21.7  20.0 - 24.0 mEq/L Final  . TCO2 04/29/2015 22.7  0 - 100 mmol/L Final  . Acid-base deficit 04/29/2015 1.7  0.0 - 2.0 mmol/L Final  . O2 Saturation 04/29/2015 98.2   Final  . Patient temperature 04/29/2015 98.6   Final  . Collection site 04/29/2015 RIGHT RADIAL   Final  . Drawn by 04/29/2015 [id number]   Final  . Sample type 04/29/2015 ARTERIAL DRAW   Final  . Chauncey Reading test (pass/fail) 04/29/2015 PASS  PASS Final  . Heparin Unfractionated 04/29/2015 0.14* 0.30 - 0.70 IU/mL Final   Comment:  IF HEPARIN RESULTS ARE BELOW EXPECTED VALUES, AND PATIENT DOSAGE HAS BEEN CONFIRMED, SUGGEST FOLLOW UP TESTING OF ANTITHROMBIN III LEVELS.   . WBC 04/30/2015 5.3  4.0 - 10.5 K/uL Final  . RBC 04/30/2015 4.07* 4.22 - 5.81 MIL/uL Final  . Hemoglobin 04/30/2015 11.1* 13.0 - 17.0 g/dL Final  . HCT 04/30/2015 33.9* 39.0 - 52.0 % Final  . MCV 04/30/2015 83.3  78.0 - 100.0 fL Final  . MCH 04/30/2015 27.3  26.0 - 34.0 pg Final  . MCHC 04/30/2015 32.7  30.0 - 36.0 g/dL Final  . RDW 04/30/2015 18.6* 11.5 - 15.5 % Final  . Platelets 04/30/2015 183  150 - 400 K/uL Final   Comment: REPEATED TO VERIFY PLATELET COUNT CONFIRMED BY SMEAR   . Sodium 04/30/2015 136  135 - 145 mmol/L Final  . Potassium 04/30/2015 4.9  3.5 - 5.1 mmol/L Final  . Chloride 04/30/2015 98* 101 - 111 mmol/L Final  . CO2 04/30/2015 24  22 - 32 mmol/L Final  . Glucose, Bld 04/30/2015 74  65 - 99 mg/dL Final  . BUN 04/30/2015 46* 6 - 20 mg/dL Final  . Creatinine, Ser 04/30/2015 9.17* 0.61 - 1.24 mg/dL Final   DELTA CHECK NOTED  . Calcium 04/30/2015 8.2* 8.9 - 10.3 mg/dL Final  . GFR calc non Af Amer 04/30/2015 5* >60 mL/min Final  . GFR calc Af Amer 04/30/2015  6* >60 mL/min Final   Comment: (NOTE) The eGFR has been calculated using the CKD EPI equation. This calculation has not been validated in all clinical situations. eGFR's persistently <60 mL/min signify possible Chronic Kidney Disease.   . Anion gap 04/30/2015 14  5 - 15 Final  . Heparin Unfractionated 04/30/2015 0.14* 0.30 - 0.70 IU/mL Final   Comment:        IF HEPARIN RESULTS ARE BELOW EXPECTED VALUES, AND PATIENT DOSAGE HAS BEEN CONFIRMED, SUGGEST FOLLOW UP TESTING OF ANTITHROMBIN III LEVELS.   Marland Kitchen Heparin Unfractionated 04/30/2015 0.41  0.30 - 0.70 IU/mL Final   Comment:        IF HEPARIN RESULTS ARE BELOW EXPECTED VALUES, AND PATIENT DOSAGE HAS BEEN CONFIRMED, SUGGEST FOLLOW UP TESTING OF ANTITHROMBIN III LEVELS.   Marland Kitchen Sodium 05/01/2015 135  135 - 145 mmol/L Final  . Potassium 05/01/2015 5.2* 3.5 - 5.1 mmol/L Final  . Chloride 05/01/2015 97* 101 - 111 mmol/L Final  . CO2 05/01/2015 21* 22 - 32 mmol/L Final  . Glucose, Bld 05/01/2015 77  65 - 99 mg/dL Final  . BUN 05/01/2015 58* 6 - 20 mg/dL Final  . Creatinine, Ser 05/01/2015 10.27* 0.61 - 1.24 mg/dL Final  . Calcium 05/01/2015 8.2* 8.9 - 10.3 mg/dL Final  . Phosphorus 05/01/2015 7.0* 2.5 - 4.6 mg/dL Final  . Albumin 05/01/2015 3.9  3.5 - 5.0 g/dL Final  . GFR calc non Af Amer 05/01/2015 5* >60 mL/min Final  . GFR calc Af Amer 05/01/2015 5* >60 mL/min Final   Comment: (NOTE) The eGFR has been calculated using the CKD EPI equation. This calculation has not been validated in all clinical situations. eGFR's persistently <60 mL/min signify possible Chronic Kidney Disease.   . Anion gap 05/01/2015 17* 5 - 15 Final  . WBC 05/01/2015 6.2  4.0 - 10.5 K/uL Final  . RBC 05/01/2015 4.32  4.22 - 5.81 MIL/uL Final  . Hemoglobin 05/01/2015 11.5* 13.0 - 17.0 g/dL Final  . HCT 05/01/2015 36.0* 39.0 - 52.0 % Final  . MCV 05/01/2015 83.3  78.0 - 100.0 fL  Final  . MCH 05/01/2015 26.6  26.0 - 34.0 pg Final  . MCHC 05/01/2015 31.9  30.0  - 36.0 g/dL Final  . RDW 05/01/2015 18.1* 11.5 - 15.5 % Final  . Platelets 05/01/2015 218  150 - 400 K/uL Final  . WBC 05/02/2015 6.3  4.0 - 10.5 K/uL Final  . RBC 05/02/2015 4.05* 4.22 - 5.81 MIL/uL Final  . Hemoglobin 05/02/2015 10.8* 13.0 - 17.0 g/dL Final  . HCT 05/02/2015 33.9* 39.0 - 52.0 % Final  . MCV 05/02/2015 83.7  78.0 - 100.0 fL Final  . MCH 05/02/2015 26.7  26.0 - 34.0 pg Final  . MCHC 05/02/2015 31.9  30.0 - 36.0 g/dL Final  . RDW 05/02/2015 18.2* 11.5 - 15.5 % Final  . Platelets 05/02/2015 139* 150 - 400 K/uL Final  . Sodium 05/02/2015 136  135 - 145 mmol/L Final  . Potassium 05/02/2015 3.9  3.5 - 5.1 mmol/L Final   DELTA CHECK NOTED  . Chloride 05/02/2015 98* 101 - 111 mmol/L Final  . CO2 05/02/2015 25  22 - 32 mmol/L Final  . Glucose, Bld 05/02/2015 98  65 - 99 mg/dL Final  . BUN 05/02/2015 39* 6 - 20 mg/dL Final  . Creatinine, Ser 05/02/2015 8.04* 0.61 - 1.24 mg/dL Final  . Calcium 05/02/2015 8.3* 8.9 - 10.3 mg/dL Final  . GFR calc non Af Amer 05/02/2015 6* >60 mL/min Final  . GFR calc Af Amer 05/02/2015 7* >60 mL/min Final   Comment: (NOTE) The eGFR has been calculated using the CKD EPI equation. This calculation has not been validated in all clinical situations. eGFR's persistently <60 mL/min signify possible Chronic Kidney Disease.   . Anion gap 05/02/2015 13  5 - 15 Final  . Hep B Core Total Ab 05/01/2015 Positive* Negative Final   Comment: (NOTE) **Verified by repeat analysis** Performed At: [facility] [place], [place] [id number] Lindon Romp MD VP:[id number]   . Hep B S Ab 05/01/2015 Reactive   Final   Comment: (NOTE)              Non Reactive: Inconsistent with immunity,                            less than 10 mIU/mL              Reactive:     Consistent with immunity,                            greater than 9.9 mIU/mL Performed At: [facility] [place], [place] [id number] Lindon Romp MD  XF:[id number]   . Hepatitis B Surface Ag 05/01/2015 Negative  Negative Final   Comment: (NOTE) Performed At: [facility] [place], [place] [id number] Lindon Romp MD FB:[id number]     No results found.   Assessment/Plan   ICD-9-CM ICD-10-CM   1. Left upper extremity swelling 729.81 M79.89   2. 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 ESRD on hemodialysis (HCC) 585.6 N18.6    V45.11 Z99.2   4. COPD, moderate ([place]) 496 J44.9   5. Paroxysmal atrial fibrillation (HCC) 427.31 I48.0   6. 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 Chronic back pain 724.5 M54.9  338.29 G89.29   8. OSA (obstructive sleep apnea) 327.23 G47.33     Change capsacian to apply to lower back qHS and q12hr prn pain  F/u with eye specialist in Oct 2016 for pterygium repair  Cont other meds as ordered  PT/OT/ST as ordered  HD TThSa as scheduled  Renal diet  GOAL: short term rehab and d/c home when medically appropriate. Communicated with pt and nursing.  Will follow  Milan Clare S. Perlie Gold  [facility] [address] [place], [place] [address] [phone] Cell (Monday-Friday 8 AM - 5 PM) [phone] After 5 PM and follow prompts

## 2015-11-27 ENCOUNTER — Other Ambulatory Visit: Payer: Self-pay | Admitting: Vascular Surgery

## 2015-11-28 ENCOUNTER — Institutional Professional Consult (permissible substitution): Payer: Self-pay | Admitting: Internal Medicine

## 2015-12-04 ENCOUNTER — Emergency Department: Payer: Medicaid Other

## 2015-12-04 ENCOUNTER — Inpatient Hospital Stay
Admission: EM | Admit: 2015-12-04 | Discharge: 2015-12-15 | DRG: 314 | Disposition: A | Discharge status: Skilled Nursing Facility | Payer: Medicaid Other | Attending: Internal Medicine | Admitting: Internal Medicine

## 2015-12-04 ENCOUNTER — Encounter: Payer: Self-pay | Admitting: Emergency Medicine

## 2015-12-04 ENCOUNTER — Other Ambulatory Visit: Payer: Self-pay

## 2015-12-04 DIAGNOSIS — E875 Hyperkalemia: Secondary | ICD-10-CM | POA: Diagnosis present

## 2015-12-04 DIAGNOSIS — Z86718 Personal history of other venous thrombosis and embolism: Secondary | ICD-10-CM | POA: Diagnosis not present

## 2015-12-04 DIAGNOSIS — F319 Bipolar disorder, unspecified: Secondary | ICD-10-CM | POA: Diagnosis present

## 2015-12-04 DIAGNOSIS — Z992 Dependence on renal dialysis: Secondary | ICD-10-CM | POA: Diagnosis not present

## 2015-12-04 DIAGNOSIS — R0902 Hypoxemia: Secondary | ICD-10-CM

## 2015-12-04 DIAGNOSIS — E871 Hypo-osmolality and hyponatremia: Secondary | ICD-10-CM | POA: Diagnosis present

## 2015-12-04 DIAGNOSIS — F3130 Bipolar disorder, current episode depressed, mild or moderate severity, unspecified: Secondary | ICD-10-CM | POA: Diagnosis not present

## 2015-12-04 DIAGNOSIS — A4101 Sepsis due to Methicillin susceptible Staphylococcus aureus: Secondary | ICD-10-CM | POA: Diagnosis present

## 2015-12-04 DIAGNOSIS — M1990 Unspecified osteoarthritis, unspecified site: Secondary | ICD-10-CM | POA: Diagnosis present

## 2015-12-04 DIAGNOSIS — Y838 Other surgical procedures as the cause of abnormal reaction of the patient, or of later complication, without mention of misadventure at the time of the procedure: Secondary | ICD-10-CM | POA: Diagnosis present

## 2015-12-04 DIAGNOSIS — J449 Chronic obstructive pulmonary disease, unspecified: Secondary | ICD-10-CM | POA: Diagnosis present

## 2015-12-04 DIAGNOSIS — I252 Old myocardial infarction: Secondary | ICD-10-CM | POA: Diagnosis not present

## 2015-12-04 DIAGNOSIS — Z8546 Personal history of malignant neoplasm of prostate: Secondary | ICD-10-CM

## 2015-12-04 DIAGNOSIS — M545 Low back pain: Secondary | ICD-10-CM | POA: Diagnosis present

## 2015-12-04 DIAGNOSIS — I5033 Acute on chronic diastolic (congestive) heart failure: Secondary | ICD-10-CM | POA: Diagnosis present

## 2015-12-04 DIAGNOSIS — E1122 Type 2 diabetes mellitus with diabetic chronic kidney disease: Secondary | ICD-10-CM | POA: Diagnosis present

## 2015-12-04 DIAGNOSIS — I2510 Atherosclerotic heart disease of native coronary artery without angina pectoris: Secondary | ICD-10-CM | POA: Diagnosis present

## 2015-12-04 DIAGNOSIS — Z79899 Other long term (current) drug therapy: Secondary | ICD-10-CM | POA: Diagnosis not present

## 2015-12-04 DIAGNOSIS — J45909 Unspecified asthma, uncomplicated: Secondary | ICD-10-CM | POA: Diagnosis present

## 2015-12-04 DIAGNOSIS — R609 Edema, unspecified: Secondary | ICD-10-CM

## 2015-12-04 DIAGNOSIS — I120 Hypertensive chronic kidney disease with stage 5 chronic kidney disease or end stage renal disease: Secondary | ICD-10-CM | POA: Diagnosis not present

## 2015-12-04 DIAGNOSIS — G9341 Metabolic encephalopathy: Secondary | ICD-10-CM | POA: Diagnosis present

## 2015-12-04 DIAGNOSIS — H9190 Unspecified hearing loss, unspecified ear: Secondary | ICD-10-CM | POA: Diagnosis present

## 2015-12-04 DIAGNOSIS — N179 Acute kidney failure, unspecified: Secondary | ICD-10-CM | POA: Diagnosis present

## 2015-12-04 DIAGNOSIS — I132 Hypertensive heart and chronic kidney disease with heart failure and with stage 5 chronic kidney disease, or end stage renal disease: Secondary | ICD-10-CM | POA: Diagnosis present

## 2015-12-04 DIAGNOSIS — G9340 Encephalopathy, unspecified: Secondary | ICD-10-CM | POA: Diagnosis present

## 2015-12-04 DIAGNOSIS — M549 Dorsalgia, unspecified: Secondary | ICD-10-CM

## 2015-12-04 DIAGNOSIS — R4182 Altered mental status, unspecified: Secondary | ICD-10-CM

## 2015-12-04 DIAGNOSIS — G4733 Obstructive sleep apnea (adult) (pediatric): Secondary | ICD-10-CM | POA: Diagnosis present

## 2015-12-04 DIAGNOSIS — I472 Ventricular tachycardia: Secondary | ICD-10-CM | POA: Diagnosis present

## 2015-12-04 DIAGNOSIS — Z880 Allergy status to penicillin: Secondary | ICD-10-CM | POA: Diagnosis not present

## 2015-12-04 DIAGNOSIS — T827XXA Infection and inflammatory reaction due to other cardiac and vascular devices, implants and grafts, initial encounter: Principal | ICD-10-CM | POA: Diagnosis present

## 2015-12-04 DIAGNOSIS — E785 Hyperlipidemia, unspecified: Secondary | ICD-10-CM | POA: Diagnosis present

## 2015-12-04 DIAGNOSIS — N186 End stage renal disease: Secondary | ICD-10-CM | POA: Diagnosis present

## 2015-12-04 DIAGNOSIS — D631 Anemia in chronic kidney disease: Secondary | ICD-10-CM | POA: Diagnosis present

## 2015-12-04 DIAGNOSIS — I4891 Unspecified atrial fibrillation: Secondary | ICD-10-CM

## 2015-12-04 DIAGNOSIS — I482 Chronic atrial fibrillation, unspecified: Secondary | ICD-10-CM | POA: Diagnosis present

## 2015-12-04 DIAGNOSIS — Z7982 Long term (current) use of aspirin: Secondary | ICD-10-CM | POA: Diagnosis not present

## 2015-12-04 DIAGNOSIS — Z515 Encounter for palliative care: Secondary | ICD-10-CM | POA: Diagnosis not present

## 2015-12-04 DIAGNOSIS — J9601 Acute respiratory failure with hypoxia: Secondary | ICD-10-CM | POA: Diagnosis present

## 2015-12-04 DIAGNOSIS — R0600 Dyspnea, unspecified: Secondary | ICD-10-CM

## 2015-12-04 DIAGNOSIS — I480 Paroxysmal atrial fibrillation: Secondary | ICD-10-CM | POA: Diagnosis present

## 2015-12-04 DIAGNOSIS — R7881 Bacteremia: Secondary | ICD-10-CM | POA: Diagnosis not present

## 2015-12-04 LAB — COMPREHENSIVE METABOLIC PANEL
ALBUMIN: 3.4 g/dL — AB (ref 3.5–5.0)
ALT: 24 U/L (ref 17–63)
ANION GAP: 17 — AB (ref 5–15)
AST: 33 U/L (ref 15–41)
Alkaline Phosphatase: 84 U/L (ref 38–126)
BILIRUBIN TOTAL: 0.8 mg/dL (ref 0.3–1.2)
BUN: 88 mg/dL — AB (ref 6–20)
CHLORIDE: 97 mmol/L — AB (ref 101–111)
CO2: 19 mmol/L — AB (ref 22–32)
Calcium: 8.5 mg/dL — ABNORMAL LOW (ref 8.9–10.3)
Creatinine, Ser: 11.79 mg/dL — ABNORMAL HIGH (ref 0.61–1.24)
GFR calc Af Amer: 5 mL/min — ABNORMAL LOW (ref >60)
GFR, EST NON AFRICAN AMERICAN: 4 mL/min — AB (ref >60)
GLUCOSE: 96 mg/dL (ref 65–99)
POTASSIUM: 6.1 mmol/L — AB (ref 3.5–5.1)
Sodium: 133 mmol/L — ABNORMAL LOW (ref 135–145)
Total Protein: 7.7 g/dL (ref 6.5–8.1)

## 2015-12-04 LAB — GLUCOSE, CAPILLARY: GLUCOSE-CAPILLARY: 93 mg/dL (ref 65–99)

## 2015-12-04 LAB — CBC
HEMATOCRIT: 33.8 % — AB (ref 40.0–52.0)
HEMOGLOBIN: 11.1 g/dL — AB (ref 13.0–18.0)
MCH: 28.6 pg (ref 26.0–34.0)
MCHC: 32.8 g/dL (ref 32.0–36.0)
MCV: 87 fL (ref 80.0–100.0)
Platelets: 154 10*3/uL (ref 150–440)
RBC: 3.89 MIL/uL — AB (ref 4.40–5.90)
RDW: 17.2 % — AB (ref 11.5–14.5)
WBC: 8.5 10*3/uL (ref 3.8–10.6)

## 2015-12-04 LAB — POTASSIUM: POTASSIUM: 5 mmol/L (ref 3.5–5.1)

## 2015-12-04 LAB — TROPONIN I
Troponin I: 0.05 ng/mL — ABNORMAL HIGH (ref <0.031)
Troponin I: 0.06 ng/mL — ABNORMAL HIGH (ref <0.031)

## 2015-12-04 LAB — APTT: APTT: 53 s — AB (ref 24–36)

## 2015-12-04 LAB — PROTIME-INR
INR: 1.18
PROTHROMBIN TIME: 15.2 s — AB (ref 11.4–15.0)

## 2015-12-04 MED ORDER — DILTIAZEM HCL 25 MG/5ML IV SOLN
15.0000 mg | Freq: Once | INTRAVENOUS | Status: AC
  Administered 2015-12-04: 15 mg via INTRAVENOUS
  Filled 2015-12-04: qty 5

## 2015-12-04 MED ORDER — HEPARIN SODIUM (PORCINE) 5000 UNIT/ML IJ SOLN: 5000.0000 [IU] | Freq: Three times a day (TID) | INTRAMUSCULAR | Status: DC

## 2015-12-04 MED ORDER — SODIUM POLYSTYRENE SULFONATE 15 GM/60ML PO SUSP
30.0000 g | ORAL | Status: AC
  Administered 2015-12-04: 30 g via ORAL
  Filled 2015-12-04: qty 120

## 2015-12-04 MED ORDER — INSULIN ASPART 100 UNIT/ML ~~LOC~~ SOLN
0.0000 [IU] | Freq: Every day | SUBCUTANEOUS | Status: DC
  Filled 2015-12-04: qty 10

## 2015-12-04 MED ORDER — ACETAMINOPHEN 650 MG RE SUPP: 650.0000 mg | Freq: Four times a day (QID) | RECTAL | Status: DC | PRN

## 2015-12-04 MED ORDER — POLYETHYLENE GLYCOL 3350 17 G PO PACK: 17.0000 g | PACK | Freq: Every day | ORAL | Status: DC | PRN

## 2015-12-04 MED ORDER — HEPARIN (PORCINE) IN NACL 100-0.45 UNIT/ML-% IJ SOLN
1650.0000 [IU]/h | INTRAMUSCULAR | Status: DC
  Administered 2015-12-04 – 2015-12-05 (×2): 1200 [IU]/h via INTRAVENOUS
  Administered 2015-12-06: 1500 [IU]/h via INTRAVENOUS
  Filled 2015-12-04 (×6): qty 250

## 2015-12-04 MED ORDER — HEPARIN BOLUS VIA INFUSION
4700.0000 [IU] | Freq: Once | INTRAVENOUS | Status: AC
  Administered 2015-12-04: 4700 [IU] via INTRAVENOUS
  Filled 2015-12-04: qty 4700

## 2015-12-04 MED ORDER — LABETALOL HCL 100 MG PO TABS
200.0000 mg | ORAL_TABLET | Freq: Three times a day (TID) | ORAL | Status: DC
  Administered 2015-12-05 – 2015-12-07 (×7): 200 mg via ORAL
  Filled 2015-12-04 (×2): qty 1
  Filled 2015-12-04: qty 2
  Filled 2015-12-04: qty 1
  Filled 2015-12-04 (×2): qty 2
  Filled 2015-12-04: qty 1
  Filled 2015-12-04: qty 2

## 2015-12-04 MED ORDER — SODIUM CHLORIDE 0.9% FLUSH
3.0000 mL | Freq: Two times a day (BID) | INTRAVENOUS | Status: DC
  Administered 2015-12-04 – 2015-12-14 (×17): 3 mL via INTRAVENOUS

## 2015-12-04 MED ORDER — DILTIAZEM HCL 100 MG IV SOLR
5.0000 mg/h | INTRAVENOUS | Status: AC
  Administered 2015-12-05: 10 mg/h via INTRAVENOUS
  Administered 2015-12-05: 7.5 mg/h via INTRAVENOUS
  Filled 2015-12-04 (×3): qty 100

## 2015-12-04 MED ORDER — ONDANSETRON HCL 4 MG PO TABS: 4.0000 mg | ORAL_TABLET | Freq: Four times a day (QID) | ORAL | Status: DC | PRN

## 2015-12-04 MED ORDER — ONDANSETRON HCL 4 MG/2ML IJ SOLN
4.0000 mg | Freq: Four times a day (QID) | INTRAMUSCULAR | Status: DC | PRN
  Administered 2015-12-05: 4 mg via INTRAVENOUS
  Filled 2015-12-04: qty 2

## 2015-12-04 MED ORDER — SODIUM CHLORIDE 0.9 % IV SOLN: 250.0000 mL | INTRAVENOUS | Status: DC | PRN

## 2015-12-04 MED ORDER — DILTIAZEM HCL 100 MG IV SOLR
5.0000 mg/h | Freq: Once | INTRAVENOUS | Status: AC
  Administered 2015-12-04: 5 mg/h via INTRAVENOUS
  Filled 2015-12-04: qty 100

## 2015-12-04 MED ORDER — ASPIRIN 81 MG PO CHEW
81.0000 mg | CHEWABLE_TABLET | Freq: Every day | ORAL | Status: DC
  Administered 2015-12-05 – 2015-12-07 (×3): 81 mg via ORAL
  Filled 2015-12-04 (×3): qty 1

## 2015-12-04 MED ORDER — SEVELAMER CARBONATE 800 MG PO TABS
2400.0000 mg | ORAL_TABLET | Freq: Three times a day (TID) | ORAL | Status: DC
  Administered 2015-12-05 – 2015-12-15 (×16): 2400 mg via ORAL
  Filled 2015-12-04 (×17): qty 3

## 2015-12-04 MED ORDER — INSULIN ASPART 100 UNIT/ML ~~LOC~~ SOLN
0.0000 [IU] | Freq: Three times a day (TID) | SUBCUTANEOUS | Status: DC
  Administered 2015-12-05: 1 [IU] via SUBCUTANEOUS
  Administered 2015-12-11: 2 [IU] via SUBCUTANEOUS
  Administered 2015-12-12 – 2015-12-13 (×2): 1 [IU] via SUBCUTANEOUS
  Administered 2015-12-15: 2 [IU] via SUBCUTANEOUS
  Filled 2015-12-04: qty 2
  Filled 2015-12-04: qty 1
  Filled 2015-12-04: qty 2
  Filled 2015-12-04 (×2): qty 1

## 2015-12-04 MED ORDER — INSULIN REGULAR HUMAN 100 UNIT/ML IJ SOLN: 10.0000 [IU] | INTRAMUSCULAR | Status: DC

## 2015-12-04 MED ORDER — LATANOPROST 0.005 % OP SOLN
1.0000 [drp] | Freq: Every day | OPHTHALMIC | Status: DC
  Administered 2015-12-05 – 2015-12-13 (×6): 1 [drp] via OPHTHALMIC
  Filled 2015-12-04 (×2): qty 2.5

## 2015-12-04 MED ORDER — SODIUM CHLORIDE 0.9% FLUSH: 3.0000 mL | INTRAVENOUS | Status: DC | PRN

## 2015-12-04 MED ORDER — ACETAMINOPHEN 325 MG PO TABS: 650.0000 mg | ORAL_TABLET | Freq: Four times a day (QID) | ORAL | Status: DC | PRN

## 2015-12-04 MED ORDER — IPRATROPIUM-ALBUTEROL 0.5-2.5 (3) MG/3ML IN SOLN: 3.0000 mL | Freq: Four times a day (QID) | RESPIRATORY_TRACT | Status: DC | PRN

## 2015-12-04 MED ORDER — DOCUSATE SODIUM 100 MG PO CAPS
100.0000 mg | ORAL_CAPSULE | Freq: Two times a day (BID) | ORAL | Status: DC
  Administered 2015-12-05 – 2015-12-07 (×6): 100 mg via ORAL
  Filled 2015-12-04 (×6): qty 1

## 2015-12-04 MED ORDER — INSULIN ASPART 100 UNIT/ML ~~LOC~~ SOLN
10.0000 [IU] | Freq: Once | SUBCUTANEOUS | Status: AC
  Administered 2015-12-04: 10 [IU] via INTRAVENOUS

## 2015-12-04 MED ORDER — DILTIAZEM HCL 100 MG IV SOLR
5.0000 mg/h | Freq: Once | INTRAVENOUS | Status: AC
  Administered 2015-12-04: 15 mg/h via INTRAVENOUS
  Filled 2015-12-04: qty 100

## 2015-12-04 MED ORDER — SODIUM CHLORIDE 0.9% FLUSH
3.0000 mL | Freq: Two times a day (BID) | INTRAVENOUS | Status: DC
  Administered 2015-12-04 – 2015-12-07 (×4): 3 mL via INTRAVENOUS

## 2015-12-04 MED ORDER — MORPHINE SULFATE (PF) 2 MG/ML IV SOLN: 2.0000 mg | INTRAVENOUS | Status: DC | PRN

## 2015-12-04 MED ORDER — HYDROCODONE-ACETAMINOPHEN 5-325 MG PO TABS: 1.0000 | ORAL_TABLET | ORAL | Status: DC | PRN

## 2015-12-04 MED ORDER — DEXTROSE 50 % IV SOLN
25.0000 mL | Freq: Once | INTRAVENOUS | Status: AC
  Administered 2015-12-04: 25 mL via INTRAVENOUS
  Filled 2015-12-04: qty 50

## 2015-12-04 NOTE — ED Notes (Signed)
Patient transported to X-ray 

## 2015-12-04 NOTE — ED Notes (Signed)
Pt arrived via EMS from a [facility] group home. EMS reports last dialysis two days ago (Saturday) but pt only received one hour of dialysis. EMS reports 132/78, 117 HR, 18 respirations, 90 % RA, 99% 3L.

## 2015-12-04 NOTE — ED Provider Notes (Signed)
Time Seen: Approximately 1620  I have reviewed the triage notes  Chief Complaint: Back Pain   History of Present Illness: Noah Lewis is a 65 y.o. male who has history of multiple medical problems and presents with lower back pain and some shortness of breath. Patient himself is a very poor historian. He does receive dialysis on Monday, Wednesday, Friday. He has not had his dialysis today. Patient was transported by EMS from a local group home. EMS reports his last dialysis was on Saturday but was only a brief dialysis treatment. He was noted to be on 90% pulse oximetry on room air was placed on a 3 L nasal cannula. The patient denies any chest or abdominal pain. His only physical complaint is some low back pain that he states he's had since high school. He denies any new weakness or fever. Past Medical History  Diagnosis Date  . Cancer ([place])   . Arthritis   . Renal disorder   . Hypertension   . Diabetes mellitus without complication ([place])   . Asthma   . COPD (chronic obstructive pulmonary disease) ([place])   . Coronary artery disease     Patient Active Problem List   Diagnosis Date Noted  . A-fib ([place]) 12/04/2015  . Hypertensive urgency 09/20/2015  . Chronic venous embolism and thrombosis of deep veins of upper extremity ([place]) 06/02/2015  . Anemia of chronic kidney failure   . Left arm swelling   . End-stage renal disease needing dialysis ([place]) 05/29/2015  . ESRD needing dialysis ([place]) 05/29/2015  . 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 COPD, moderate ([place]) 05/24/2015  . OSA (obstructive sleep apnea) 05/24/2015  . 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 ESRD on hemodialysis ([place]) 05/13/2015  . New onset atrial fibrillation ([place]) 05/13/2015  . Volume overload 04/28/2015  . Bipolar depression ([place]) 04/28/2015  . HTN (hypertension) 04/28/2015  . Chronic back pain 04/28/2015  . Facial swelling 04/28/2015  . Left  upper extremity swelling     Past Surgical History  Procedure Laterality Date  . No past surgeries    . Insertion of dialysis catheter    . Insertion of dialysis catheter N/A 05/29/2015    Procedure: INSERTION OF DIALYSIS CATHETER;  Surgeon: Angelia Mould, MD;  Location: [facility];  Service: Vascular;  Laterality: N/A;  . Peripheral vascular catheterization N/A 10/25/2015    Procedure: Dialysis/Perma Catheter Insertion;  Surgeon: Katha Cabal, MD;  Location: [facility];  Service: Cardiovascular;  Laterality: N/A;    Past Surgical History  Procedure Laterality Date  . No past surgeries    . Insertion of dialysis catheter    . Insertion of dialysis catheter N/A 05/29/2015    Procedure: INSERTION OF DIALYSIS CATHETER;  Surgeon: Angelia Mould, MD;  Location: [facility];  Service: Vascular;  Laterality: N/A;  . Peripheral vascular catheterization N/A 10/25/2015    Procedure: Dialysis/Perma Catheter Insertion;  Surgeon: Katha Cabal, MD;  Location: [facility];  Service: Cardiovascular;  Laterality: N/A;    Current Outpatient Rx  Name  Route  Sig  Dispense  Refill  . acetaminophen (TYLENOL) 325 MG tablet   Oral   Take 650 mg by mouth every 6 (six) hours as needed for mild pain, fever or headache.         Marland Kitchen aspirin 81 MG chewable tablet   Oral   Chew 1 tablet (81 mg total) by mouth  daily.   30 tablet   11   . b complex-vitamin c-folic acid (NEPHRO-VITE) 0.8 MG TABS tablet   Oral   Take 1 tablet by mouth 2 (two) times daily.         . capsaicin (ZOSTRIX) 0.025 % cream   Topical   Apply 1 application topically 2 (two) times daily as needed (for lower back pain).         Marland Kitchen ipratropium-albuterol (DUONEB) 0.5-2.5 (3) MG/3ML SOLN   Nebulization   Take 3 mLs by nebulization every 6 (six) hours as needed (for wheezing/shortness of breath).         . labetalol (NORMODYNE) 200 MG tablet   Oral   Take 1 tablet (200 mg total) by mouth 3 (three) times  daily.   90 tablet   0   . loratadine (CLARITIN) 10 MG tablet   Oral   Take 1 tablet (10 mg total) by mouth daily.   30 tablet   11   . polyethylene glycol (MIRALAX / GLYCOLAX) packet   Oral   Take 17 g by mouth daily as needed for mild constipation.   14 each   0   . sevelamer carbonate (RENVELA) 800 MG tablet   Oral   Take 2,400 mg by mouth 3 (three) times daily with meals.         . Travoprost, BAK Free, (TRAVATAN) 0.004 % SOLN ophthalmic solution   Both Eyes   Place 1 drop into both eyes at bedtime.         . enalapril (VASOTEC) 5 MG tablet   Oral   Take 1 tablet (5 mg total) by mouth daily. Patient not taking: Reported on 12/04/2015   30 tablet   0   . oxyCODONE-acetaminophen (ROXICET) 5-325 MG tablet   Oral   Take 1 tablet by mouth every 6 (six) hours as needed. Patient not taking: Reported on 12/04/2015   15 tablet   0     Allergies:  Penicillins  Family History: Family History  Problem Relation Age of Onset  . Hypertension Other   . Diabetes Other     Social History: Social History  Substance Use Topics  . Smoking status: Never Smoker   . Smokeless tobacco: Never Used  . Alcohol Use: No     Review of Systems:   10 point review of systems was performed and was otherwise negative: Review of systems acquired from EMS and medical record Constitutional: No fever Eyes: No visual disturbances ENT: No sore throat, ear pain Cardiac: No chest pain Respiratory: No shortness of breath, wheezing, or stridor Abdomen: No abdominal pain, no vomiting, No diarrhea Endocrine: No weight loss, No night sweats Extremities: AV fistula on the left upper extremity Skin: No rashes, easy bruising Neurologic: No focal weakness, trouble with speech or swollowing Urologic: No dysuria, Hematuria, or urinary frequency   Physical Exam:  ED Triage Vitals  Enc Vitals Group     BP 12/04/15 1354 121/96 mmHg     Pulse Rate 12/04/15 1354 53     Resp 12/04/15 1354  18     Temp 12/04/15 1354 99.1 F (37.3 C)     Temp Source 12/04/15 1354 Oral     SpO2 12/04/15 1354 94 %     Weight 12/04/15 1354 174 lb (78.926 kg)     Height 12/04/15 1354 5\' 9"  (1.753 m)     Head Cir --      Peak Flow --  Pain Score --      Pain Loc --      Pain Edu? --      Excl. in [place]? --     General: Awake , Alert , and Oriented times 2 he is able to answer some questions appropriately Head: Normal cephalic , atraumatic Eyes: Pupils equal , round, reactive to light Nose/Throat: No nasal drainage, patent upper airway without erythema or exudate.  Neck: Supple, Full range of motion, No anterior adenopathy or palpable thyroid masses Lungs: Clear to ascultation without wheezes , rhonchi, or rales Heart: Irregular rate, irregular rhythm without murmurs , gallops , or rubs Abdomen: Soft, non tender without rebound, guarding , or rigidity; bowel sounds positive and symmetric in all 4 quadrants. No organomegaly .        Extremities: 2 plus symmetric pulses. No edema, clubbing or cyanosis Neurologic: normal ambulation, Motor symmetric without deficits, sensory intact Skin: warm, dry, no rashes   Labs:   All laboratory work was reviewed including any pertinent negatives or positives listed below:  Labs Reviewed  CBC - Abnormal; Notable for the following:    RBC 3.89 (*)    Hemoglobin 11.1 (*)    HCT 33.8 (*)    RDW 17.2 (*)    All other components within normal limits  COMPREHENSIVE METABOLIC PANEL - Abnormal; Notable for the following:    Sodium 133 (*)    Potassium 6.1 (*)    Chloride 97 (*)    CO2 19 (*)    BUN 88 (*)    Creatinine, Ser 11.79 (*)    Calcium 8.5 (*)    Albumin 3.4 (*)    GFR calc non Af Amer 4 (*)    GFR calc Af Amer 5 (*)    Anion gap 17 (*)    All other components within normal limits  TROPONIN I - Abnormal; Notable for the following:    Troponin I 0.05 (*)    All other components within normal limits  POTASSIUM  TROPONIN I  TROPONIN I   BASIC METABOLIC PANEL  CBC  TSH  APTT  PROTIME-INR   reviewed the patient's laboratory work shows findings typical of renal failure especially with no recent dialysis  EKG: * ED ECG REPORT I, Daymon Larsen, the attending physician, personally viewed and interpreted this ECG.  Date: 12/04/2015 EKG Time: 1411 Rate: 131 Rhythm: Atrial fibrillation with rapid ventricular rate QRS Axis: normal Intervals: normal ST/T Wave abnormalities: Nonspecific ST-T wave abnormality Conduction Disturbances: none Narrative Interpretation: unremarkable   Radiology:       CLINICAL DATA: Shortness of breath. Chronic renal failure  EXAM: CHEST 2 VIEW  COMPARISON: September 20, 2015  FINDINGS: Dialysis catheter placed from a femoral location has its tip in the right atrium. No pneumothorax. There is no edema or consolidation. Heart is mildly enlarged with pulmonary vascularity within normal limits. No adenopathy. There is a left subclavian stent. There is degenerative change in the thoracic spine.  IMPRESSION: No edema or consolidation. Cardiomegaly is stable. Central catheter tip is in the right atrium. No pneumothorax.   Electronically Signed By: Lowella Grip III M.D. On: 12/04/2015 14:55          DG Lumbar Spine 2-3 Views (Final result) Result time: 12/04/15 14:54:37   Final result by Rad Results In Interface (12/04/15 14:54:37)   Narrative:   CLINICAL DATA: Low back pain for approximately 2 days. No known injury. Initial encounter.  EXAM: LUMBAR SPINE - 2-3 VIEW  COMPARISON: CT abdomen and pelvis 09/20/2015.  FINDINGS: Vertebral body height and alignment are maintained. Anterior endplate spurring lower thoracic spine is noted. Intervertebral disc space height is normal. Dialysis catheter from a right groin approach is noted.  IMPRESSION: No acute abnormality. Mild appearing lower thoracic spondylosis.   Electronically Signed By: Inge Rise M.D.      I personally reviewed the radiologic studies   Critical Care: * CRITICAL CARE Performed by: Daymon Larsen   Total critical care time: 37 minutes  Critical care time was exclusive of separately billable procedures and treating other patients.  Critical care was necessary to treat or prevent imminent or life-threatening deterioration.  Critical care was time spent personally by me on the following activities: development of treatment plan with patient and/or surrogate as well as nursing, discussions with consultants, evaluation of patient's response to treatment, examination of patient, obtaining history from patient or surrogate, ordering and performing treatments and interventions, ordering and review of laboratory studies, ordering and review of radiographic studies, pulse oximetry and re-evaluation of patient's condition. Initiation of antiarrhythmic medications and repeat evaluation and monitoring    ED Course:  The patient had some decrease in his atrial fibrillation which overall seems to be an incidental finding at this point. He has cardiomegaly on his chest x-ray but no obvious indications of a acute pulmonary edema. Patient's only mildly hypoxic and has done well here with supplemental oxygen therapy. Patient's back pain seems to be an old issue and there is no acute findings on x-ray evaluation. I felt was unlikely the patient had an epidural abscess or acute ischemia. He also does not appear to have cauda equina syndrome. Patient's atrial fibrillation was addressed here with again initiation of antiarrhythmic therapy and review of his records show no previous history of atrial fibrillation    Assessment:  Atrial fibrillation with rapid ventricular rate   Final Clinical Impression:  Final diagnoses:  Atrial fibrillation, unspecified type [facility])     Plan:  Inpatient management           Daymon Larsen, MD 12/04/15 2105

## 2015-12-04 NOTE — Progress Notes (Addendum)
ANTICOAGULATION CONSULT NOTE - Initial Consult  Pharmacy Consult for Heparin  Indication: atrial fibrillation  Allergies  Allergen Reactions  . Penicillins Other (See Comments)    Reaction:  Unknown     Patient Measurements: Height: 5\' 9"  (175.3 cm) Weight: 174 lb (78.926 kg) IBW/kg (Calculated) : 70.7 Heparin Dosing Weight: 78.9  Vital Signs: Temp: 99.1 F (37.3 C) (03/13 1354) Temp Source: Oral (03/13 1354) BP: 150/90 mmHg (03/13 1940) Pulse Rate: 110 (03/13 1940)  Labs:  Recent Labs  12/04/15 1544  HGB 11.1*  HCT 33.8*  PLT 154  CREATININE 11.79*  TROPONINI 0.05*    Estimated Creatinine Clearance: 6.3 mL/min (by C-G formula based on Cr of 11.79).   Medical History: Past Medical History  Diagnosis Date  . Cancer ([place])   . Arthritis   . Renal disorder   . Hypertension   . Diabetes mellitus without complication ([place])   . Asthma   . COPD (chronic obstructive pulmonary disease) ([place])   . Coronary artery disease     Medications:   (Not in a hospital admission)  Assessment: Pharmacy consulted to dose heparin in this 65 year old male admitted with new onset Afib.  No prior anti-coag noted. CrCl = 6.3 ml/min   Goal of Therapy:  Heparin level 0.3-0.7 units/ml Monitor platelets by anticoagulation protocol: Yes   Plan:  Will d/c heparin 5000 units SQ Q8H.  Give 4700 units bolus x 1 Start heparin infusion at 1200 units/hr  Will order baseline aPTT and INR. Will draw 1st HL 8 hrs after start of drip on 3/14 @ 0400.  Sonoma Firkus D 12/04/2015,7:48 PM

## 2015-12-04 NOTE — H&P (Signed)
[facility] NAME: Noah Lewis    MR#:  [id number]  DATE OF BIRTH:  1951/04/25  DATE OF ADMISSION:  12/04/2015  PRIMARY CARE PHYSICIAN: Elyse Jarvis, MD   REQUESTING/REFERRING PHYSICIAN: Dr. Marcelene Butte  CHIEF COMPLAINT:   Chief Complaint  Patient presents with  . Back Pain    HISTORY OF PRESENT ILLNESS:  Noah Lewis  is a 65 y.o. male with a known history of hypertension, diabetes, incision disease on hemodialysis and depression presents to the emergency room sent in from group home after he complained of back pain and refused dialysis in medications. History has been obtained from family at bedside, nursing staff and reviewing old records. Patient is poor historian. Other than complaining of back and lower extremity pain he is unable to give me any history. She refused dialysis earlier today and received only one hour of dialysis on Friday. He has been found to have potassium of 6.1. Chest x-ray shows no fluid overload. He does have new onset atrial fibrillation on the EKG. Cardizem 15 mg IV 1 time given with inadequate heart rate control and hospitalist was consult.  PAST MEDICAL HISTORY:   Past Medical History  Diagnosis Date  . Cancer ([place])   . Arthritis   . Renal disorder   . Hypertension   . Diabetes mellitus without complication ([place])   . Asthma   . COPD (chronic obstructive pulmonary disease) ([place])   . Coronary artery disease     PAST SURGICAL HISTORY:   Past Surgical History  Procedure Laterality Date  . No past surgeries    . Insertion of dialysis catheter    . Insertion of dialysis catheter N/A 05/29/2015    Procedure: INSERTION OF DIALYSIS CATHETER;  Surgeon: Angelia Mould, MD;  Location: [facility];  Service: Vascular;  Laterality: N/A;  . Peripheral vascular catheterization N/A 10/25/2015    Procedure: Dialysis/Perma Catheter Insertion;  Surgeon: Katha Cabal, MD;  Location: [facility];  Service: Cardiovascular;  Laterality: N/A;    SOCIAL HISTORY:   Social History  Substance Use Topics  . Smoking status: Never Smoker   . Smokeless tobacco: Never Used  . Alcohol Use: No    FAMILY HISTORY:   Family History  Problem Relation Age of Onset  . Hypertension Other   . Diabetes Other     DRUG ALLERGIES:   Allergies  Allergen Reactions  . Penicillins Other (See Comments)    Reaction:  Unknown     REVIEW OF SYSTEMS:   Review of Systems  Unable to perform ROS: mental acuity    MEDICATIONS AT HOME:   Prior to Admission medications   Medication Sig Start Date End Date Taking? Authorizing Provider  acetaminophen (TYLENOL) 325 MG tablet Take 650 mg by mouth every 6 (six) hours as needed for mild pain, fever or headache.   Yes Historical Provider, MD  aspirin 81 MG chewable tablet Chew 1 tablet (81 mg total) by mouth daily. 06/02/15  Yes Maryellen Pile, MD  b complex-vitamin c-folic acid (NEPHRO-VITE) 0.8 MG TABS tablet Take 1 tablet by mouth 2 (two) times daily.   Yes Historical Provider, MD  capsaicin (ZOSTRIX) 0.025 % cream Apply 1 application topically 2 (two) times daily as needed (for lower back pain).   Yes Historical Provider, MD  ipratropium-albuterol (DUONEB) 0.5-2.5 (3) MG/3ML SOLN Take 3 mLs by nebulization every 6 (six) hours as needed (for wheezing/shortness of breath).   Yes  Historical Provider, MD  labetalol (NORMODYNE) 200 MG tablet Take 1 tablet (200 mg total) by mouth 3 (three) times daily. 05/24/15  Yes Gerlene Fee, NP  loratadine (CLARITIN) 10 MG tablet Take 1 tablet (10 mg total) by mouth daily. 05/24/15  Yes Gerlene Fee, NP  polyethylene glycol (MIRALAX / GLYCOLAX) packet Take 17 g by mouth daily as needed for mild constipation. 05/23/15  Yes Liberty Handy, MD  sevelamer carbonate (RENVELA) 800 MG tablet Take 2,400 mg by mouth 3 (three) times daily with meals.   Yes Historical Provider, MD  Travoprost, BAK Free, (TRAVATAN)  0.004 % SOLN ophthalmic solution Place 1 drop into both eyes at bedtime.   Yes Historical Provider, MD  enalapril (VASOTEC) 5 MG tablet Take 1 tablet (5 mg total) by mouth daily. Patient not taking: Reported on 12/04/2015 09/22/15   Nicholes Mango, MD  oxyCODONE-acetaminophen (ROXICET) 5-325 MG tablet Take 1 tablet by mouth every 6 (six) hours as needed. Patient not taking: Reported on 12/04/2015 09/22/15   Nicholes Mango, MD     VITAL SIGNS:  Blood pressure 169/71, pulse 101, temperature 99.1 F (37.3 C), temperature source Oral, resp. rate 20, height 5\' 9"  (1.753 m), weight 78.926 kg (174 lb), SpO2 95 %.  PHYSICAL EXAMINATION:  Physical Exam  GENERAL:  65 y.o.-year-old patient lying in the bed with no acute distress.  EYES: Pupils equal, round, reactive to light and accommodation. No scleral icterus. Extraocular muscles intact.  HEENT: Head atraumatic, normocephalic. Oropharynx and nasopharynx clear. No oropharyngeal erythema, moist oral mucosa  NECK:  Supple, no jugular venous distention. No thyroid enlargement, no tenderness.  LUNGS: Normal breath sounds bilaterally, no wheezing, rales, rhonchi. No use of accessory muscles of respiration.  CARDIOVASCULAR: S1, S2 normal. No murmurs, rubs, or gallops. Irregular and tachycardic ABDOMEN: Soft, nontender, nondistended. Bowel sounds present. No organomegaly or mass.  EXTREMITIES: No pedal edema, cyanosis, or clubbing. + 2 pedal & radial pulses b/l.   NEUROLOGIC: Patient follows some commands. Motor strength in lower extremities 4 over 5 bilaterally. Sensations intact PSYCHIATRIC: The patient is drowsy SKIN: No obvious rash, lesion, or ulcer.   LABORATORY PANEL:   CBC  Recent Labs Lab 12/04/15 1544  WBC 8.5  HGB 11.1*  HCT 33.8*  PLT 154   ------------------------------------------------------------------------------------------------------------------  Chemistries   Recent Labs Lab 12/04/15 1544  NA 133*  K 6.1*  CL 97*  CO2 19*   GLUCOSE 96  BUN 88*  CREATININE 11.79*  CALCIUM 8.5*  AST 33  ALT 24  ALKPHOS 84  BILITOT 0.8   ------------------------------------------------------------------------------------------------------------------  Cardiac Enzymes  Recent Labs Lab 12/04/15 1544  TROPONINI 0.05*   ------------------------------------------------------------------------------------------------------------------  RADIOLOGY:  Dg Chest 2 View  12/04/2015  CLINICAL DATA:  Shortness of breath.  Chronic renal failure EXAM: CHEST  2 VIEW COMPARISON:  September 20, 2015 FINDINGS: Dialysis catheter placed from a femoral location has its tip in the right atrium. No pneumothorax. There is no edema or consolidation. Heart is mildly enlarged with pulmonary vascularity within normal limits. No adenopathy. There is a left subclavian stent. There is degenerative change in the thoracic spine. IMPRESSION: No edema or consolidation. Cardiomegaly is stable. Central catheter tip is in the right atrium. No pneumothorax. Electronically Signed   By: Lowella Grip III M.D.   On: 12/04/2015 14:55   Dg Lumbar Spine 2-3 Views  12/04/2015  CLINICAL DATA:  Low back pain for approximately 2 days. No known injury. Initial encounter. EXAM: LUMBAR SPINE - 2-3 VIEW  COMPARISON:  CT abdomen and pelvis 09/20/2015. FINDINGS: Vertebral body height and alignment are maintained. Anterior endplate spurring lower thoracic spine is noted. Intervertebral disc space height is normal. Dialysis catheter from a right groin approach is noted. IMPRESSION: No acute abnormality.  Mild appearing lower thoracic spondylosis. Electronically Signed   By: Inge Rise M.D.   On: 12/04/2015 14:54     IMPRESSION AND PLAN:   * New onset Atrial fibrillation Patient still tachycardic in spite of IV Cardizem. Will be started on Cardizem drip. Check echocardiogram. Consult cardiology. Check TSH. Could be due to hyperkalemia from missing dialysis. Start heparin  drip  * Hyperkalemia with end-stage renal disease Patient has refused dialysis today. He received only one hour dialysis on Friday. Discussed Dr. Candiss Norse regarding urgent dialysis. At this time we will give him a dose of Kayexalate, IV insulin and D50. Wait for dialysis. We'll repeat potassium in 2 hours.   * Hyperkalemia Continue home medications. Hold Vasotec.  * Severe depression Patient has been refusing dialysis in medications. [facility] consult psychiatry.  * chronic low back pain. Placed on pain medications.  * DVT prophylaxis   patient is on heparin drip   All the records are reviewed and case discussed with ED provider. Management plans discussed with the patient, family and they are in agreement.  CODE STATUS: FULL CODE  TOTAL TIME TAKING CARE OF THIS PATIENT: 40 minutes.   Hillary Bow R M.D on 12/04/2015 at 7:19 PM  Between 7am to 6pm - Pager - [phone]  After 6pm go to [email or web address] - password [email or web address]  Office  [phone]  CC: Primary care physician; Elyse Jarvis, MD  Note: This dictation was prepared with Dragon dictation along with smaller phrase technology. Any transcriptional errors that result from this process are unintentional.

## 2015-12-04 NOTE — ED Notes (Signed)
Assisted pt with using the restroom, did get a urine sample. Pt sheets and clothes were changed.

## 2015-12-04 NOTE — ED Notes (Signed)
Attempted to call report. Nurse is in another room giving medications. Asked to leave number and she will call back.

## 2015-12-04 NOTE — Progress Notes (Signed)
Subjective:  Patient is a chronic hemodialysis patient who is known to our practice from outpatient He dialyzes at the [facility] dialysis center on [address] According to outpatient notes, he missed his dialysis treatment on the 10th but did get a short treatment on Saturday He presents today with lower back pain and shortness of breath He is not able to provide good history. He lives at a local group home. In the emergency room, he was noted to have 90% sats on room air He is also noted to have a low-grade temperature of 99.1 Out patient, he would not allow administration of antibiotics  Patient was noted to have hyperkalemia. He is admitted for further evaluation and management He was treated with Kayexalate, insulin and D50 combination. Currently he is on Cardizem drip and heparin drip  Objective:  Vital signs in last 24 hours:  Temp:  [99.1 F (37.3 C)] 99.1 F (37.3 C) (03/13 1354) Pulse Rate:  [46-130] 105 (03/13 2100) Resp:  [18-28] 23 (03/13 2100) BP: (113-189)/(63-99) 135/89 mmHg (03/13 2100) SpO2:  [92 %-99 %] 94 % (03/13 2100) Weight:  [78.926 kg (174 lb)] 78.926 kg (174 lb) (03/13 1354)  Weight change:  Filed Weights   12/04/15 1354  Weight: 78.926 kg (174 lb)    Intake/Output:   No intake or output data in the 24 hours ending 12/04/15 2128   Physical Exam: General: Chronically ill-appearing, laying in the bed   HEENT Moist oral mucous membranes   Neck Supple, distended veins   Pulm/lungs Normal respiratory effort, clear anteriorly and laterally   CVS/Heart Irregular rhythm   Abdomen:  Soft, nontender   Extremities:  + Dependent edema   Neurologic: Alert, answers a few questions   Skin: No acute rashes   Access: Left arm AV fistula        Basic Metabolic Panel:   Recent Labs Lab 12/04/15 1544  NA 133*  K 6.1*  CL 97*  CO2 19*  GLUCOSE 96  BUN 88*  CREATININE 11.79*  CALCIUM 8.5*     CBC:  Recent Labs Lab 12/04/15 1544  WBC  8.5  HGB 11.1*  HCT 33.8*  MCV 87.0  PLT 154      Microbiology:  No results found for this or any previous visit (from the past 720 hour(s)).  Coagulation Studies: No results for input(s): LABPROT, INR in the last 72 hours.  Urinalysis: No results for input(s): COLORURINE, LABSPEC, PHURINE, GLUCOSEU, HGBUR, BILIRUBINUR, KETONESUR, PROTEINUR, UROBILINOGEN, NITRITE, LEUKOCYTESUR in the last 72 hours.  Invalid input(s): APPERANCEUR    Imaging: Dg Chest 2 View  12/04/2015  CLINICAL DATA:  Shortness of breath.  Chronic renal failure EXAM: CHEST  2 VIEW COMPARISON:  September 20, 2015 FINDINGS: Dialysis catheter placed from a femoral location has its tip in the right atrium. No pneumothorax. There is no edema or consolidation. Heart is mildly enlarged with pulmonary vascularity within normal limits. No adenopathy. There is a left subclavian stent. There is degenerative change in the thoracic spine. IMPRESSION: No edema or consolidation. Cardiomegaly is stable. Central catheter tip is in the right atrium. No pneumothorax. Electronically Signed   By: Lowella Grip III M.D.   On: 12/04/2015 14:55   Dg Lumbar Spine 2-3 Views  12/04/2015  CLINICAL DATA:  Low back pain for approximately 2 days. No known injury. Initial encounter. EXAM: LUMBAR SPINE - 2-3 VIEW COMPARISON:  CT abdomen and pelvis 09/20/2015. FINDINGS: Vertebral body height and alignment are maintained. Anterior endplate spurring  lower thoracic spine is noted. Intervertebral disc space height is normal. Dialysis catheter from a right groin approach is noted. IMPRESSION: No acute abnormality.  Mild appearing lower thoracic spondylosis. Electronically Signed   By: Inge Rise M.D.   On: 12/04/2015 14:54     Medications:   . sodium chloride    . heparin 1,200 Units/hr (12/04/15 2008)   . docusate sodium  100 mg Oral BID  . insulin aspart  0-5 Units Subcutaneous QHS  . [START ON 12/05/2015] insulin aspart  0-9 Units  Subcutaneous TID WC  . sodium chloride flush  3 mL Intravenous Q12H  . sodium chloride flush  3 mL Intravenous Q12H   sodium chloride, acetaminophen **OR** acetaminophen, HYDROcodone-acetaminophen, morphine injection, ondansetron **OR** ondansetron (ZOFRAN) IV, polyethylene glycol, sodium chloride flush  Assessment/ Plan:  65 y.o. male with end-stage renal disease, diabetes, hypertension [employer]/ [employer]  1. End-stage renal disease with hyperkalemia - We will get patient dialyzed early morning - He has been treated with shifting measures and Kayexalate  2. Atrial fibrillation - Currently treated with anticoagulation, Cardizem drip  3. Anemia of chronic kidney disease - Hemoglobin 11.1     LOS: 0 Maylin [place] 3/13/20179:28 PM

## 2015-12-05 ENCOUNTER — Inpatient Hospital Stay (HOSPITAL_COMMUNITY)
Admit: 2015-12-05 | Discharge: 2015-12-05 | Disposition: A | Discharge status: Home / Self Care | Payer: Medicaid Other | Attending: Internal Medicine | Admitting: Internal Medicine

## 2015-12-05 ENCOUNTER — Ambulatory Visit: Payer: Self-pay | Admitting: Urology

## 2015-12-05 ENCOUNTER — Ambulatory Visit: Admission: RE | Admit: 2015-12-05 | Payer: Medicaid Other | Source: Ambulatory Visit | Admitting: Vascular Surgery

## 2015-12-05 ENCOUNTER — Encounter: Payer: Self-pay | Admitting: Urology

## 2015-12-05 ENCOUNTER — Encounter
Admission: EM | Disposition: A | Discharge status: Skilled Nursing Facility | Payer: Self-pay | Source: Home / Self Care | Attending: Internal Medicine

## 2015-12-05 DIAGNOSIS — I4891 Unspecified atrial fibrillation: Secondary | ICD-10-CM

## 2015-12-05 DIAGNOSIS — F3130 Bipolar disorder, current episode depressed, mild or moderate severity, unspecified: Secondary | ICD-10-CM

## 2015-12-05 LAB — CBC
HEMATOCRIT: 33.4 % — AB (ref 40.0–52.0)
HEMOGLOBIN: 11 g/dL — AB (ref 13.0–18.0)
MCH: 28.4 pg (ref 26.0–34.0)
MCHC: 32.8 g/dL (ref 32.0–36.0)
MCV: 86.4 fL (ref 80.0–100.0)
Platelets: 148 10*3/uL — ABNORMAL LOW (ref 150–440)
RBC: 3.86 MIL/uL — ABNORMAL LOW (ref 4.40–5.90)
RDW: 17 % — ABNORMAL HIGH (ref 11.5–14.5)
WBC: 7.8 10*3/uL (ref 3.8–10.6)

## 2015-12-05 LAB — MRSA PCR SCREENING: MRSA BY PCR: NEGATIVE

## 2015-12-05 LAB — RENAL FUNCTION PANEL
ALBUMIN: 3.2 g/dL — AB (ref 3.5–5.0)
ANION GAP: 12 (ref 5–15)
BUN: 63 mg/dL — AB (ref 6–20)
CALCIUM: 8.1 mg/dL — AB (ref 8.9–10.3)
CO2: 26 mmol/L (ref 22–32)
Chloride: 95 mmol/L — ABNORMAL LOW (ref 101–111)
Creatinine, Ser: 8.39 mg/dL — ABNORMAL HIGH (ref 0.61–1.24)
GFR calc Af Amer: 7 mL/min — ABNORMAL LOW (ref >60)
GFR, EST NON AFRICAN AMERICAN: 6 mL/min — AB (ref >60)
GLUCOSE: 107 mg/dL — AB (ref 65–99)
PHOSPHORUS: 3.6 mg/dL (ref 2.5–4.6)
POTASSIUM: 3.8 mmol/L (ref 3.5–5.1)
SODIUM: 133 mmol/L — AB (ref 135–145)

## 2015-12-05 LAB — BASIC METABOLIC PANEL
ANION GAP: 18 — AB (ref 5–15)
BUN: 97 mg/dL — AB (ref 6–20)
CHLORIDE: 98 mmol/L — AB (ref 101–111)
CO2: 16 mmol/L — AB (ref 22–32)
Calcium: 8 mg/dL — ABNORMAL LOW (ref 8.9–10.3)
Creatinine, Ser: 12.44 mg/dL — ABNORMAL HIGH (ref 0.61–1.24)
GFR calc Af Amer: 4 mL/min — ABNORMAL LOW (ref >60)
GFR calc non Af Amer: 4 mL/min — ABNORMAL LOW (ref >60)
GLUCOSE: 126 mg/dL — AB (ref 65–99)
Potassium: 4.7 mmol/L (ref 3.5–5.1)
Sodium: 132 mmol/L — ABNORMAL LOW (ref 135–145)

## 2015-12-05 LAB — TSH: TSH: 7.083 u[IU]/mL — ABNORMAL HIGH (ref 0.350–4.500)

## 2015-12-05 LAB — ECHOCARDIOGRAM COMPLETE
Height: 69 in
WEIGHTICAEL: 2784 [oz_av]

## 2015-12-05 LAB — GLUCOSE, CAPILLARY
GLUCOSE-CAPILLARY: 89 mg/dL (ref 65–99)
Glucose-Capillary: 126 mg/dL — ABNORMAL HIGH (ref 65–99)
Glucose-Capillary: 107 mg/dL — ABNORMAL HIGH (ref 65–99)
Glucose-Capillary: 107 mg/dL — ABNORMAL HIGH (ref 65–99)
Glucose-Capillary: 98 mg/dL (ref 65–99)

## 2015-12-05 LAB — HEPARIN LEVEL (UNFRACTIONATED)
HEPARIN UNFRACTIONATED: 0.39 [IU]/mL (ref 0.30–0.70)
HEPARIN UNFRACTIONATED: 0.11 [IU]/mL — AB (ref 0.30–0.70)

## 2015-12-05 LAB — TROPONIN I: Troponin I: 0.05 ng/mL — ABNORMAL HIGH (ref <0.031)

## 2015-12-05 SURGERY — DIALYSIS/PERMA CATHETER REMOVAL
Anesthesia: Moderate Sedation

## 2015-12-05 MED ORDER — QUETIAPINE FUMARATE 100 MG PO TABS
100.0000 mg | ORAL_TABLET | Freq: Every day | ORAL | Status: DC
  Administered 2015-12-05: 100 mg via ORAL
  Filled 2015-12-05: qty 1

## 2015-12-05 MED ORDER — VANCOMYCIN HCL 1000 MG IV SOLR: 1000.0000 mg | INTRAVENOUS | Status: DC

## 2015-12-05 MED ORDER — HEPARIN BOLUS VIA INFUSION
2350.0000 [IU] | Freq: Once | INTRAVENOUS | Status: AC
  Administered 2015-12-05: 2350 [IU] via INTRAVENOUS
  Filled 2015-12-05: qty 2350

## 2015-12-05 MED ORDER — LABETALOL HCL 5 MG/ML IV SOLN
10.0000 mg | INTRAVENOUS | Status: DC | PRN
  Filled 2015-12-05 (×2): qty 4

## 2015-12-05 MED ORDER — VANCOMYCIN HCL 10 G IV SOLR
1750.0000 mg | Freq: Once | INTRAVENOUS | Status: AC
  Administered 2015-12-05: 1750 mg via INTRAVENOUS
  Filled 2015-12-05: qty 1750

## 2015-12-05 MED ORDER — VANCOMYCIN HCL 10 G IV SOLR
1750.0000 mg | Freq: Once | INTRAVENOUS | Status: DC
  Filled 2015-12-05: qty 1750

## 2015-12-05 MED ORDER — VANCOMYCIN HCL IN DEXTROSE 750-5 MG/150ML-% IV SOLN
750.0000 mg | INTRAVENOUS | Status: DC
  Administered 2015-12-06 – 2015-12-11 (×3): 750 mg via INTRAVENOUS
  Filled 2015-12-05 (×4): qty 150

## 2015-12-05 NOTE — Progress Notes (Signed)
Pharmacy Antibiotic Note  Noah Lewis is a 65 y.o. male with a h/o ESRD on HD admitted on 12/04/2015 with GPC  bacteremia.  Pharmacy has been consulted for vancomycin dosing.  Plan: Patient receiving dialysis today. Will begin vancomycin 1750 mg iv once after dialysis then 750 mg iv q HD. Will check a pre-HD vancomycin level prior to the third dialysis session. Goal vancomycin level 15-25 mcg/ml.   Height: 5\' 9"  (175.3 cm) Weight: 173 lb 15.1 oz (78.9 kg) IBW/kg (Calculated) : 70.7  Temp (24hrs), Avg:98.6 F (37 C), Min:97.8 F (36.6 C), Max:100.4 F (38 C)   Recent Labs Lab 12/04/15 1544 12/05/15 0422  WBC 8.5 7.8  CREATININE 11.79* 12.44*    Estimated Creatinine Clearance: 6 mL/min (by C-G formula based on Cr of 12.44).    Allergies  Allergen Reactions  . Penicillins Other (See Comments)    Reaction:  Unknown     Antimicrobials this admission: Vancomycin  3/14 >>    Dose adjustments this admission:   Microbiology results: 3/14 BCx: pending Blood cx from [facility] dialysis: GPC in clusters in 2/2 per MD  3/13 MRSA PCR: negative  Thank you for allowing pharmacy to be a part of this patient's care.  Ulice Dash D 12/05/2015 4:07 PM

## 2015-12-05 NOTE — Progress Notes (Signed)
ANTICOAGULATION CONSULT NOTE - Follow up [facility] for Heparin  Indication: atrial fibrillation  Allergies  Allergen Reactions  . Penicillins Other (See Comments)    Reaction:  Unknown     Patient Measurements: Height: 5\' 9"  (175.3 cm) Weight: 173 lb 15.1 oz (78.9 kg) IBW/kg (Calculated) : 70.7 Heparin Dosing Weight: 78.9  Vital Signs: Temp: 98.4 F (36.9 C) (03/14 1500) Temp Source: Axillary (03/14 1500) BP: 138/87 mmHg (03/14 1700) Pulse Rate: 83 (03/14 1700)  Labs:  Recent Labs  12/04/15 1544 12/04/15 2240 12/05/15 0422 12/05/15 1507  HGB 11.1*  --  11.0*  --   HCT 33.8*  --  33.4*  --   PLT 154  --  148*  --   APTT  --  53*  --   --   LABPROT  --  15.2*  --   --   INR  --  1.18  --   --   HEPARINUNFRC  --   --  0.39 0.11*  CREATININE 11.79*  --  12.44* 8.39*  TROPONINI 0.05* 0.06* 0.05*  --     Estimated Creatinine Clearance: 8.9 mL/min (by C-G formula based on Cr of 8.39).   Assessment: Pharmacy consulted to dose heparin in this 65 year old male admitted with new onset Afib.  No prior anti-coag noted. CrCl = 6.3 ml/min   3/13 Hgb 11.1, Plt 154 3/14 Hgb 11.0, Plt 148   Per RN, confirms heparin drip running at 12 ml/hr; no line issues or s/sx of bleeding. Drip has not been turned off  Goal of Therapy:  Heparin level 0.3-0.7 units/ml Monitor platelets by anticoagulation protocol: Yes   Plan:  Heparin level resulted low at 0.11. Confirmed with nurse that drip was not turned off. Pt currently getting HD. Will bolous 2350units and increase rate to 1450units/hr. Recheck in 8 hours.  Ramond Dial, Pharm.D Clinical Pharmacist   12/05/2015,5:24 PM

## 2015-12-05 NOTE — Care Management (Signed)
Patient is from [facility]. He is a chronic 02 through [facility] and is a chronic dialysis patient at [facility] on [facility]TS .  He is admitted with new atrial fib to icu stepdown and currently on cardizem drip and heparin drip.  It is reported that patient has declined some of her dialysis treatments.  CSW referral

## 2015-12-05 NOTE — Progress Notes (Signed)
Post HD, pt tolerated tx. Vitals remained stable throughout tx. 2073ml UF removed. Pt stable

## 2015-12-05 NOTE — Progress Notes (Signed)
ANTICOAGULATION CONSULT NOTE - Follow up [facility] for Heparin  Indication: atrial fibrillation  Allergies  Allergen Reactions  . Penicillins Other (See Comments)    Reaction:  Unknown     Patient Measurements: Height: 5\' 9"  (175.3 cm) Weight: 174 lb (78.926 kg) IBW/kg (Calculated) : 70.7 Heparin Dosing Weight: 78.9  Vital Signs: Temp: 100.4 F (38 C) (03/14 0500) Temp Source: Oral (03/14 0500) BP: 179/107 mmHg (03/14 0600) Pulse Rate: 98 (03/14 0600)  Labs:  Recent Labs  12/04/15 1544 12/04/15 2240 12/05/15 0422  HGB 11.1*  --  11.0*  HCT 33.8*  --  33.4*  PLT 154  --  148*  APTT  --  53*  --   LABPROT  --  15.2*  --   INR  --  1.18  --   HEPARINUNFRC  --   --  0.39  CREATININE 11.79*  --  12.44*  TROPONINI 0.05* 0.06* 0.05*    Estimated Creatinine Clearance: 6 mL/min (by C-G formula based on Cr of 12.44).   Assessment: Pharmacy consulted to dose heparin in this 65 year old male admitted with new onset Afib.  No prior anti-coag noted. CrCl = 6.3 ml/min   3/13 Hgb 11.1, Plt 154 3/14 Hgb 11.0, Plt 148   Per RN, confirms heparin drip running at 12 ml/hr; no line issues or s/sx of bleeding.  Goal of Therapy:  Heparin level 0.3-0.7 units/ml Monitor platelets by anticoagulation protocol: Yes   Plan:  First heparin level therapeutic at 0.39. Will continue current drip rate and recheck level in 8h - 3/14 at 1300 Hgb and Plt count are relatively stable  Rayna Sexton L 12/05/2015,6:57 AM

## 2015-12-05 NOTE — Progress Notes (Signed)
Subjective:  Patient is a chronic hemodialysis patient who is known to our practice from outpatient He dialyzes at the [facility] dialysis center on [address]  According to outpatient notes, last dialysis treatment was on Saturday Patient was noted to have hyperkalemia. He was treated with Kayexalate, insulin and D50 combination. Currently he is on Cardizem drip and heparin drip More alert today Potassium has been corrected with shifting measures   Objective:  Vital signs in last 24 hours:  Temp:  [97.8 F (36.6 C)-100.4 F (38 C)] 98.2 F (36.8 C) (03/14 0800) Pulse Rate:  [46-130] 97 (03/14 0942) Resp:  [18-30] 22 (03/14 0700) BP: (113-189)/(63-107) 181/105 mmHg (03/14 0942) SpO2:  [92 %-100 %] 100 % (03/14 0700) Weight:  [78.926 kg (174 lb)] 78.926 kg (174 lb) (03/13 1354)  Weight change:  Filed Weights   12/04/15 1354  Weight: 78.926 kg (174 lb)    Intake/Output:    Intake/Output Summary (Last 24 hours) at 12/05/15 1026 Last data filed at 12/05/15 0944  Gross per 24 hour  Intake    135 ml  Output      0 ml  Net    135 ml     Physical Exam: General: Chronically ill-appearing, laying in the bed   HEENT Moist oral mucous membranes   Neck Supple, distended veins   Pulm/lungs Normal respiratory effort, clear anteriorly and laterally   CVS/Heart Irregular rhythm   Abdomen:  Soft, nontender   Extremities:  + Dependent edema   Neurologic: Alert, answers a few questions   Skin: No acute rashes   Access: Left arm AV fistula        Basic Metabolic Panel:   Recent Labs Lab 12/04/15 1544 12/04/15 2240 12/05/15 0422  NA 133*  --  132*  K 6.1* 5.0 4.7  CL 97*  --  98*  CO2 19*  --  16*  GLUCOSE 96  --  126*  BUN 88*  --  97*  CREATININE 11.79*  --  12.44*  CALCIUM 8.5*  --  8.0*     CBC:  Recent Labs Lab 12/04/15 1544 12/05/15 0422  WBC 8.5 7.8  HGB 11.1* 11.0*  HCT 33.8* 33.4*  MCV 87.0 86.4  PLT 154 148*       Microbiology:  Recent Results (from the past 720 hour(s))  MRSA PCR Screening     Status: None   Collection Time: 12/04/15 10:40 PM  Result Value Ref Range Status   MRSA by PCR NEGATIVE NEGATIVE Final    Comment:        The GeneXpert MRSA Assay (FDA approved for NASAL specimens only), is one component of a comprehensive MRSA colonization surveillance program. It is not intended to diagnose MRSA infection nor to guide or monitor treatment for MRSA infections.     Coagulation Studies:  Recent Labs  12/04/15 2240  LABPROT 15.2*  INR 1.18    Urinalysis: No results for input(s): COLORURINE, LABSPEC, PHURINE, GLUCOSEU, HGBUR, BILIRUBINUR, KETONESUR, PROTEINUR, UROBILINOGEN, NITRITE, LEUKOCYTESUR in the last 72 hours.  Invalid input(s): APPERANCEUR    Imaging: Dg Chest 2 View  12/04/2015  CLINICAL DATA:  Shortness of breath.  Chronic renal failure EXAM: CHEST  2 VIEW COMPARISON:  September 20, 2015 FINDINGS: Dialysis catheter placed from a femoral location has its tip in the right atrium. No pneumothorax. There is no edema or consolidation. Heart is mildly enlarged with pulmonary vascularity within normal limits. No adenopathy. There is a left subclavian stent. There is  degenerative change in the thoracic spine. IMPRESSION: No edema or consolidation. Cardiomegaly is stable. Central catheter tip is in the right atrium. No pneumothorax. Electronically Signed   By: Lowella Grip III M.D.   On: 12/04/2015 14:55   Dg Lumbar Spine 2-3 Views  12/04/2015  CLINICAL DATA:  Low back pain for approximately 2 days. No known injury. Initial encounter. EXAM: LUMBAR SPINE - 2-3 VIEW COMPARISON:  CT abdomen and pelvis 09/20/2015. FINDINGS: Vertebral body height and alignment are maintained. Anterior endplate spurring lower thoracic spine is noted. Intervertebral disc space height is normal. Dialysis catheter from a right groin approach is noted. IMPRESSION: No acute abnormality.  Mild  appearing lower thoracic spondylosis. Electronically Signed   By: Inge Rise M.D.   On: 12/04/2015 14:54     Medications:   . diltiazem (CARDIZEM) infusion 10 mg/hr (12/05/15 0943)  . heparin 1,200 Units/hr (12/04/15 2008)   . aspirin  81 mg Oral Daily  . docusate sodium  100 mg Oral BID  . insulin aspart  0-5 Units Subcutaneous QHS  . insulin aspart  0-9 Units Subcutaneous TID WC  . labetalol  200 mg Oral TID  . latanoprost  1 drop Both Eyes QHS  . sevelamer carbonate  2,400 mg Oral TID WC  . sodium chloride flush  3 mL Intravenous Q12H  . sodium chloride flush  3 mL Intravenous Q12H   sodium chloride, acetaminophen **OR** acetaminophen, HYDROcodone-acetaminophen, ipratropium-albuterol, labetalol, morphine injection, ondansetron **OR** ondansetron (ZOFRAN) IV, polyethylene glycol, sodium chloride flush  Assessment/ Plan:  65 y.o. male with end-stage renal disease, diabetes, hypertension [employer]/ [employer]  1. End-stage renal disease with hyperkalemia - We will get patient dialyzed today   2. Atrial fibrillation - Currently treated with anticoagulation, Cardizem drip  3. Anemia of chronic kidney disease - Hemoglobin 11.0 - low dose procrit if Hgb decreases  4. SHPTH - monitor phos level     LOS: 1 Kaija Kovacevic 3/14/201710:26 AM

## 2015-12-05 NOTE — Progress Notes (Signed)
eLink Physician-Brief Progress Note Patient Name: Noah Lewis DOB: Jul 13, 1951 MRN: [id number]   Date of Service  12/05/2015  HPI/Events of Note  Admitted with acute on chronic renal failure Repeat K OK Was seen by nephrology earlier Plan for dialysis in AM  eICU Interventions  No eICU intervention     Intervention Category Evaluation Type: New Patient Evaluation  Simonne Maffucci 12/05/2015, 12:06 AM

## 2015-12-05 NOTE — Progress Notes (Signed)
*  PRELIMINARY RESULTS* Echocardiogram 2D Echocardiogram has been performed.  Noah Lewis 12/05/2015, 10:41 AM

## 2015-12-05 NOTE — Progress Notes (Signed)
TX started 

## 2015-12-05 NOTE — Progress Notes (Signed)
PRE HD, Pt stable for HD, does not appear to be in any distress

## 2015-12-05 NOTE — Progress Notes (Signed)
Pt is still minimally responsive. He acknowledges nurse with eye contact but is not speaking at this time. He has had no complaints of pain on my shift. Blood pressure has been better controled on my shift.   HR is remaining below 100 while still in Afib.   Report given to [facility]

## 2015-12-05 NOTE — Progress Notes (Signed)
TX ended.    

## 2015-12-05 NOTE — H&P (Signed)
[place] [facility] [place] NAME: Noah Lewis    MR#:  [id number]  DATE OF BIRTH:  10/26/1950  SUBJECTIVE:  CHIEF COMPLAINT:   Chief Complaint  Patient presents with  . Back Pain  No complaint.  REVIEW OF SYSTEMS:  CONSTITUTIONAL: No fever, fatigue or weakness.  EYES: No blurred or double vision.  EARS, NOSE, AND THROAT: No tinnitus or ear pain.  RESPIRATORY: No cough, shortness of breath, wheezing or hemoptysis.  CARDIOVASCULAR: No chest pain, orthopnea, edema.  GASTROINTESTINAL: No nausea, vomiting, diarrhea or abdominal pain.  GENITOURINARY: No dysuria, hematuria.  ENDOCRINE: No polyuria, nocturia,  HEMATOLOGY: No anemia, easy bruising or bleeding SKIN: No rash or lesion. MUSCULOSKELETAL: No joint pain or arthritis.   NEUROLOGIC: No tingling, numbness, weakness.  PSYCHIATRY: No anxiety or depression.   DRUG ALLERGIES:   Allergies  Allergen Reactions  . Penicillins Other (See Comments)    Reaction:  Unknown     VITALS:  Blood pressure 178/97, pulse 99, temperature 98.2 F (36.8 C), temperature source Oral, resp. rate 22, height 5\' 9"  (1.753 m), weight 78.926 kg (174 lb), SpO2 100 %.  PHYSICAL EXAMINATION:  GENERAL:  65 y.o.-year-old patient lying in the bed with no acute distress. EYES: Pupils equal, round, reactive to light and accommodation. No scleral icterus. Extraocular muscles intact.  HEENT: Head atraumatic, normocephalic. Oropharynx and nasopharynx clear.  NECK:  Supple, no jugular venous distention. No thyroid enlargement, no tenderness.  LUNGS: Normal breath sounds bilaterally, no wheezing, rales,rhonchi or crepitation. No use of accessory muscles of respiration.  CARDIOVASCULAR: S1, S2 normal. No murmurs, rubs, or gallops.  ABDOMEN: Soft, nontender, nondistended. Bowel sounds present. No organomegaly or mass.  EXTREMITIES: No pedal edema, cyanosis, or clubbing.  NEUROLOGIC: Cranial nerves II through XII are  intact. Muscle strength 5/5 in all extremities. Sensation intact. Gait not checked.  PSYCHIATRIC: The patient is alert and oriented x 3. But looks confused. SKIN: No obvious rash, lesion, or ulcer.    LABORATORY PANEL:   CBC  Recent Labs Lab 12/05/15 0422  WBC 7.8  HGB 11.0*  HCT 33.4*  PLT 148*   ------------------------------------------------------------------------------------------------------------------  Chemistries   Recent Labs Lab 12/04/15 1544  12/05/15 0422  NA 133*  --  132*  K 6.1*  < > 4.7  CL 97*  --  98*  CO2 19*  --  16*  GLUCOSE 96  --  126*  BUN 88*  --  97*  CREATININE 11.79*  --  12.44*  CALCIUM 8.5*  --  8.0*  AST 33  --   --   ALT 24  --   --   ALKPHOS 84  --   --   BILITOT 0.8  --   --   < > = values in this interval not displayed. ------------------------------------------------------------------------------------------------------------------  Cardiac Enzymes  Recent Labs Lab 12/05/15 0422  TROPONINI 0.05*   ------------------------------------------------------------------------------------------------------------------  RADIOLOGY:  Dg Chest 2 View  12/04/2015  CLINICAL DATA:  Shortness of breath.  Chronic renal failure EXAM: CHEST  2 VIEW COMPARISON:  September 20, 2015 FINDINGS: Dialysis catheter placed from a femoral location has its tip in the right atrium. No pneumothorax. There is no edema or consolidation. Heart is mildly enlarged with pulmonary vascularity within normal limits. No adenopathy. There is a left subclavian stent. There is degenerative change in the thoracic spine. IMPRESSION: No edema or consolidation. Cardiomegaly is stable. Central catheter tip is in the right atrium. No pneumothorax. Electronically  Signed   By: Lowella Grip III M.D.   On: 12/04/2015 14:55   Dg Lumbar Spine 2-3 Views  12/04/2015  CLINICAL DATA:  Low back pain for approximately 2 days. No known injury. Initial encounter. EXAM: LUMBAR SPINE -  2-3 VIEW COMPARISON:  CT abdomen and pelvis 09/20/2015. FINDINGS: Vertebral body height and alignment are maintained. Anterior endplate spurring lower thoracic spine is noted. Intervertebral disc space height is normal. Dialysis catheter from a right groin approach is noted. IMPRESSION: No acute abnormality.  Mild appearing lower thoracic spondylosis. Electronically Signed   By: Inge Rise M.D.   On: 12/04/2015 14:54    EKG:   Orders placed or performed during the hospital encounter of 12/04/15  . ED EKG  . ED EKG    ASSESSMENT AND PLAN:   * New onset Atrial fibrillation Try to wean off IV Cardizem drip.continue  heparin drip, f/u echocardiogram and Consult cardiology.   * Hyperkalemia with end-stage renal disease Improved with Kayexalate, IV insulin and D50. Continue HD. Hold Vasotec  * Hypertension. Continue labetalol and cardizem. Hold Vasotec.  * Severe depression f/u psychiatry.  * chronic low back pain.  pain control.  * DM2. Sliding scale.  * Hyponatremia. Adjust during HD.  All the records are reviewed and case discussed with Care Management/Social Workerr. Management plans discussed with the patient, family and they are in agreement.  CODE STATUS: full code.  TOTAL CRITICAL TIME TAKING CARE OF THIS PATIENT: 43 minutes.  Greater than 50% time was spent on coordination of care and face-to-face counseling.  POSSIBLE D/C IN 2-3 DAYS, DEPENDING ON CLINICAL CONDITION.   Demetrios Loll M.D on 12/05/2015 at 8:45 AM  Between 7am to 6pm - Pager - [phone]  After 6pm go to [email or web address] - password [email or web address] [facility] [facility]  Office  [phone]  CC: Primary care physician; Elyse Jarvis, MD

## 2015-12-05 NOTE — Consult Note (Signed)
[facility] Face-to-Face Psychiatry Consult   Reason for Consult:  Consult for this 65 year old man with multiple medical problems and reported worsening depression. Concerns about his depression and behavior Referring Physician:  Bridgett Larsson Patient Identification: Noah Lewis MRN:  [id number] Principal Diagnosis: Bipolar disorder depressed Diagnosis:   Patient Active Problem List   Diagnosis Date Noted  . A-fib ([place]) [I48.91] 12/04/2015  . Hypertensive urgency [I16.0] 09/20/2015  . 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 Anemia of chronic kidney failure [N18.9, D63.1]   . Left arm swelling [M79.89]   . End-stage renal disease needing dialysis ([place]) [N18.6] 05/29/2015  . ESRD needing dialysis ([place]) [N18.6] 05/29/2015  . Benign hypertensive renal disease with renal failure [I12.9, N18.9] 05/24/2015  . COPD, moderate ([place]) [J44.9] 05/24/2015  . OSA (obstructive sleep apnea) [G47.33] 05/24/2015  . Type II diabetes mellitus with end-stage renal disease ([place]) [C78.93, N18.6] 05/24/2015  . ESRD on hemodialysis ([place]) [N18.6, Z99.2] 05/13/2015  . New onset atrial fibrillation ([place]) [I48.91] 05/13/2015  . Volume overload [E87.70] 04/28/2015  . Bipolar depression ([place]) [F31.30] 04/28/2015  . HTN (hypertension) [I10] 04/28/2015  . Chronic back pain [M54.9, G89.29] 04/28/2015  . Facial swelling [R22.0] 04/28/2015  . Left upper extremity swelling [M79.89]     Total Time spent with patient: 45 minutes  Subjective:   Noah Lewis is a 65 y.o. male patient admitted with "I used to sell cocaine".  HPI:  Attempted patient interview. Attempted several times to get him to wake up. Patient would open his eyes for only a few seconds at a time. The sentence above was the only coherent thing he said to me and he did not follow-up on it. Chart reviewed. The only psychiatric note in the chart is from a psychiatrist at [facility] last summer and  doesn't give much detail because they didn't have much information at that time. Reviewed his medicines from his group home. Spoke with nursing. Patient in the hospital with atrial fibrillation also chronic hemodialysis. Reportedly has been noncompliant with dialysis refusing some of his dialysis treatments. Nursing reports the patient is refusing to eat anything. Nursing reports she has been told that earlier this week the patient was told that he was losing custody of his children. I find this somewhat confusing as at his age I would not expect him to have children young enough for that matter and from what I understood he was living in a group home and so unlikely to be raising any children anyway. Asian of course did not give me any information about this. He was not able to answer any questions about his mood or suicidal ideation or psychotic symptoms. According to the records from his group home he was not on any kind of psychiatric medicine.  Social history: Evidently he has been living in a group home. Prior to that there are reports that he had been homeless at times. Has an elderly mother who is listed as his only contact.  Medical history: Hemodialysis dependent. Hypertension. Atrial fibrillation. COPD. Diabetes.  Substance abuse history: Patient didn't give me any information about that and I don't have anything old and the chart about it.  Past Psychiatric History: Patient reportedly has a past diagnosis of bipolar disorder. The only known hospitalization was in 2002 and predates electronic records. In the time that I can find in his chart it doesn't look like he was taking any psychiatric medicine. Unknown therefore the details or whether he  really has bipolar disorder.  Risk to Self:   patient is currently not eating and is not cooperative with treatment underlying mental state hard to tell Risk to Others:   none Prior Inpatient Therapy:   1 known prior hospitalization records are not  available Prior Outpatient Therapy:   did not appear to be involved in any outpatient treatment recently  Past Medical History:  Past Medical History  Diagnosis Date  . Cancer ([place])   . Arthritis   . Renal disorder   . Hypertension   . Diabetes mellitus without complication ([place])   . Asthma   . COPD (chronic obstructive pulmonary disease) ([place])   . Coronary artery disease     Past Surgical History  Procedure Laterality Date  . No past surgeries    . Insertion of dialysis catheter    . Insertion of dialysis catheter N/A 05/29/2015    Procedure: INSERTION OF DIALYSIS CATHETER;  Surgeon: Angelia Mould, MD;  Location: [place];  Service: Vascular;  Laterality: N/A;  . Peripheral vascular catheterization N/A 10/25/2015    Procedure: Dialysis/Perma Catheter Insertion;  Surgeon: Katha Cabal, MD;  Location: [place] [facility];  Service: Cardiovascular;  Laterality: N/A;   Family History:  Family History  Problem Relation Age of Onset  . Hypertension Other   . Diabetes Other    Family Psychiatric  History: Patient was not able to answer any questions above. Social History:  History  Alcohol Use No     History  Drug Use No    Social History   Social History  . Marital Status: Married    Spouse Name: N/A  . Number of Children: N/A  . Years of Education: N/A   Social History Main Topics  . Smoking status: Never Smoker   . Smokeless tobacco: Never Used  . Alcohol Use: No  . Drug Use: No  . Sexual Activity: Not Asked   Other Topics Concern  . None   Social History Narrative   Additional Social History:    Allergies:   Allergies  Allergen Reactions  . Penicillins Other (See Comments)    Reaction:  Unknown     Labs:  Results for orders placed or performed during the hospital encounter of 12/04/15 (from the past 48 hour(s))  CBC     Status: Abnormal   Collection Time: 12/04/15  3:44 PM  Result Value Ref Range   WBC 8.5 3.8 - 10.6 K/uL   RBC 3.89 (L)  4.40 - 5.90 MIL/uL   Hemoglobin 11.1 (L) 13.0 - 18.0 g/dL   HCT 33.8 (L) 40.0 - 52.0 %   MCV 87.0 80.0 - 100.0 fL   MCH 28.6 26.0 - 34.0 pg   MCHC 32.8 32.0 - 36.0 g/dL   RDW 17.2 (H) 11.5 - 14.5 %   Platelets 154 150 - 440 K/uL  Comprehensive metabolic panel     Status: Abnormal   Collection Time: 12/04/15  3:44 PM  Result Value Ref Range   Sodium 133 (L) 135 - 145 mmol/L   Potassium 6.1 (H) 3.5 - 5.1 mmol/L   Chloride 97 (L) 101 - 111 mmol/L   CO2 19 (L) 22 - 32 mmol/L   Glucose, Bld 96 65 - 99 mg/dL   BUN 88 (H) 6 - 20 mg/dL   Creatinine, Ser 11.79 (H) 0.61 - 1.24 mg/dL   Calcium 8.5 (L) 8.9 - 10.3 mg/dL   Total Protein 7.7 6.5 - 8.1 g/dL   Albumin 3.4 (  L) 3.5 - 5.0 g/dL   AST 33 15 - 41 U/L   ALT 24 17 - 63 U/L   Alkaline Phosphatase 84 38 - 126 U/L   Total Bilirubin 0.8 0.3 - 1.2 mg/dL   GFR calc non Af Amer 4 (L) >60 mL/min   GFR calc Af Amer 5 (L) >60 mL/min    Comment: (NOTE) The eGFR has been calculated using the CKD EPI equation. This calculation has not been validated in all clinical situations. eGFR's persistently <60 mL/min signify possible Chronic Kidney Disease.    Anion gap 17 (H) 5 - 15  Troponin I     Status: Abnormal   Collection Time: 12/04/15  3:44 PM  Result Value Ref Range   Troponin I 0.05 (H) <0.031 ng/mL    Comment: READ BACK AND VERIFIED WITH EMMA HUNTER AT 1287 12/04/2015 BY TFK        PERSISTENTLY INCREASED TROPONIN VALUES IN THE RANGE OF 0.04-0.49 ng/mL CAN BE SEEN IN:       -UNSTABLE ANGINA       -CONGESTIVE HEART FAILURE       -MYOCARDITIS       -CHEST TRAUMA       -ARRYHTHMIAS       -LATE PRESENTING MYOCARDIAL INFARCTION       -COPD   CLINICAL FOLLOW-UP RECOMMENDED.   Glucose, capillary     Status: None   Collection Time: 12/04/15 10:36 PM  Result Value Ref Range   Glucose-Capillary 93 65 - 99 mg/dL   Comment 1 Notify RN   Potassium     Status: None   Collection Time: 12/04/15 10:40 PM  Result Value Ref Range   Potassium  5.0 3.5 - 5.1 mmol/L  Troponin I     Status: Abnormal   Collection Time: 12/04/15 10:40 PM  Result Value Ref Range   Troponin I 0.06 (H) <0.031 ng/mL    Comment: PREVIOUS RESULT CALLED EMMA HUNTER AT 1735 ON 12/04/15 BY TFK/ VAB        PERSISTENTLY INCREASED TROPONIN VALUES IN THE RANGE OF 0.04-0.49 ng/mL CAN BE SEEN IN:       -UNSTABLE ANGINA       -CONGESTIVE HEART FAILURE       -MYOCARDITIS       -CHEST TRAUMA       -ARRYHTHMIAS       -LATE PRESENTING MYOCARDIAL INFARCTION       -COPD   CLINICAL FOLLOW-UP RECOMMENDED.   TSH     Status: Abnormal   Collection Time: 12/04/15 10:40 PM  Result Value Ref Range   TSH 7.083 (H) 0.350 - 4.500 uIU/mL  APTT     Status: Abnormal   Collection Time: 12/04/15 10:40 PM  Result Value Ref Range   aPTT 53 (H) 24 - 36 seconds    Comment:        IF BASELINE aPTT IS ELEVATED, SUGGEST PATIENT RISK ASSESSMENT BE USED TO DETERMINE APPROPRIATE ANTICOAGULANT THERAPY.   Protime-INR     Status: Abnormal   Collection Time: 12/04/15 10:40 PM  Result Value Ref Range   Prothrombin Time 15.2 (H) 11.4 - 15.0 seconds   INR 1.18   MRSA PCR Screening     Status: None   Collection Time: 12/04/15 10:40 PM  Result Value Ref Range   MRSA by PCR NEGATIVE NEGATIVE    Comment:        The GeneXpert MRSA Assay (FDA approved for NASAL specimens only), is one component  of a comprehensive MRSA colonization surveillance program. It is not intended to diagnose MRSA infection nor to guide or monitor treatment for MRSA infections.   Troponin I     Status: Abnormal   Collection Time: 12/05/15  4:22 AM  Result Value Ref Range   Troponin I 0.05 (H) <0.031 ng/mL    Comment: PREVIOUS RESULT CALLED EMMA HUNTER AT 1735 ON 12/04/15 BY TFK/VAB        PERSISTENTLY INCREASED TROPONIN VALUES IN THE RANGE OF 0.04-0.49 ng/mL CAN BE SEEN IN:       -UNSTABLE ANGINA       -CONGESTIVE HEART FAILURE       -MYOCARDITIS       -CHEST TRAUMA       -ARRYHTHMIAS       -LATE  PRESENTING MYOCARDIAL INFARCTION       -COPD   CLINICAL FOLLOW-UP RECOMMENDED.   Basic metabolic panel     Status: Abnormal   Collection Time: 12/05/15  4:22 AM  Result Value Ref Range   Sodium 132 (L) 135 - 145 mmol/L   Potassium 4.7 3.5 - 5.1 mmol/L   Chloride 98 (L) 101 - 111 mmol/L   CO2 16 (L) 22 - 32 mmol/L   Glucose, Bld 126 (H) 65 - 99 mg/dL   BUN 97 (H) 6 - 20 mg/dL   Creatinine, Ser 26.01 (H) 0.61 - 1.24 mg/dL   Calcium 8.0 (L) 8.9 - 10.3 mg/dL   GFR calc non Af Amer 4 (L) >60 mL/min   GFR calc Af Amer 4 (L) >60 mL/min    Comment: (NOTE) The eGFR has been calculated using the CKD EPI equation. This calculation has not been validated in all clinical situations. eGFR's persistently <60 mL/min signify possible Chronic Kidney Disease.    Anion gap 18 (H) 5 - 15  CBC     Status: Abnormal   Collection Time: 12/05/15  4:22 AM  Result Value Ref Range   WBC 7.8 3.8 - 10.6 K/uL   RBC 3.86 (L) 4.40 - 5.90 MIL/uL   Hemoglobin 11.0 (L) 13.0 - 18.0 g/dL   HCT 81.5 (L) 19.0 - 41.4 %   MCV 86.4 80.0 - 100.0 fL   MCH 28.4 26.0 - 34.0 pg   MCHC 32.8 32.0 - 36.0 g/dL   RDW 31.5 (H) 11.2 - 84.9 %   Platelets 148 (L) 150 - 440 K/uL  Heparin level (unfractionated)     Status: None   Collection Time: 12/05/15  4:22 AM  Result Value Ref Range   Heparin Unfractionated 0.39 0.30 - 0.70 IU/mL    Comment:        IF HEPARIN RESULTS ARE BELOW EXPECTED VALUES, AND PATIENT DOSAGE HAS BEEN CONFIRMED, SUGGEST FOLLOW UP TESTING OF ANTITHROMBIN III LEVELS.   Glucose, capillary     Status: Abnormal   Collection Time: 12/05/15  7:33 AM  Result Value Ref Range   Glucose-Capillary 126 (H) 65 - 99 mg/dL  Glucose, capillary     Status: Abnormal   Collection Time: 12/05/15 12:04 PM  Result Value Ref Range   Glucose-Capillary 107 (H) 65 - 99 mg/dL  Renal function panel     Status: Abnormal   Collection Time: 12/05/15  3:07 PM  Result Value Ref Range   Sodium 133 (L) 135 - 145 mmol/L    Potassium 3.8 3.5 - 5.1 mmol/L   Chloride 95 (L) 101 - 111 mmol/L   CO2 26 22 - 32 mmol/L   Glucose,  Bld 107 (H) 65 - 99 mg/dL   BUN 63 (H) 6 - 20 mg/dL   Creatinine, Ser 8.39 (H) 0.61 - 1.24 mg/dL   Calcium 8.1 (L) 8.9 - 10.3 mg/dL   Phosphorus 3.6 2.5 - 4.6 mg/dL   Albumin 3.2 (L) 3.5 - 5.0 g/dL   GFR calc non Af Amer 6 (L) >60 mL/min   GFR calc Af Amer 7 (L) >60 mL/min    Comment: (NOTE) The eGFR has been calculated using the CKD EPI equation. This calculation has not been validated in all clinical situations. eGFR's persistently <60 mL/min signify possible Chronic Kidney Disease.    Anion gap 12 5 - 15  Heparin level (unfractionated)     Status: Abnormal   Collection Time: 12/05/15  3:07 PM  Result Value Ref Range   Heparin Unfractionated 0.11 (L) 0.30 - 0.70 IU/mL    Comment:        IF HEPARIN RESULTS ARE BELOW EXPECTED VALUES, AND PATIENT DOSAGE HAS BEEN CONFIRMED, SUGGEST FOLLOW UP TESTING OF ANTITHROMBIN III LEVELS.   Glucose, capillary     Status: None   Collection Time: 12/05/15  5:08 PM  Result Value Ref Range   Glucose-Capillary 89 65 - 99 mg/dL    Current Facility-Administered Medications  Medication Dose Route Frequency Provider Last Rate Last Dose  . 0.9 %  sodium chloride infusion  250 mL Intravenous PRN Srikar Sudini, MD      . acetaminophen (TYLENOL) tablet 650 mg  650 mg Oral Q6H PRN Hillary Bow, MD       Or  . acetaminophen (TYLENOL) suppository 650 mg  650 mg Rectal Q6H PRN Srikar Sudini, MD      . aspirin chewable tablet 81 mg  81 mg Oral Daily Hillary Bow, MD   81 mg at 12/05/15 0942  . diltiazem (CARDIZEM) 100 mg in dextrose 5 % 100 mL (1 mg/mL) infusion  5-15 mg/hr Intravenous Continuous Srikar Sudini, MD 7.5 mL/hr at 12/05/15 1207 7.5 mg/hr at 12/05/15 1207  . docusate sodium (COLACE) capsule 100 mg  100 mg Oral BID Hillary Bow, MD   100 mg at 12/05/15 0942  . heparin ADULT infusion 100 units/mL (25000 units/250 mL)  1,450 Units/hr  Intravenous Continuous Demetrios Loll, MD 12 mL/hr at 12/05/15 1350 1,200 Units/hr at 12/05/15 1350  . heparin bolus via infusion 2,350 Units  2,350 Units Intravenous Once Demetrios Loll, MD      . HYDROcodone-acetaminophen (NORCO/VICODIN) 5-325 MG per tablet 1-2 tablet  1-2 tablet Oral Q4H PRN Srikar Sudini, MD      . insulin aspart (novoLOG) injection 0-5 Units  0-5 Units Subcutaneous QHS Hillary Bow, MD   0 Units at 12/04/15 2258  . insulin aspart (novoLOG) injection 0-9 Units  0-9 Units Subcutaneous TID WC Hillary Bow, MD   1 Units at 12/05/15 0844  . ipratropium-albuterol (DUONEB) 0.5-2.5 (3) MG/3ML nebulizer solution 3 mL  3 mL Nebulization Q6H PRN Srikar Sudini, MD      . labetalol (NORMODYNE) tablet 200 mg  200 mg Oral TID Hillary Bow, MD   200 mg at 12/05/15 0942  . labetalol (NORMODYNE,TRANDATE) injection 10 mg  10 mg Intravenous Q2H PRN Harmeet Singh, MD      . latanoprost (XALATAN) 0.005 % ophthalmic solution 1 drop  1 drop Both Eyes QHS Hillary Bow, MD   1 drop at 12/05/15 0045  . morphine 2 MG/ML injection 2 mg  2 mg Intravenous Q4H PRN Hillary Bow, MD      .  ondansetron (ZOFRAN) tablet 4 mg  4 mg Oral Q6H PRN Hillary Bow, MD       Or  . ondansetron (ZOFRAN) injection 4 mg  4 mg Intravenous Q6H PRN Hillary Bow, MD   4 mg at 12/05/15 0853  . polyethylene glycol (MIRALAX / GLYCOLAX) packet 17 g  17 g Oral Daily PRN Srikar Sudini, MD      . sevelamer carbonate (RENVELA) tablet 2,400 mg  2,400 mg Oral TID WC Srikar Sudini, MD   2,400 mg at 12/05/15 1314  . sodium chloride flush (NS) 0.9 % injection 3 mL  3 mL Intravenous Q12H Hillary Bow, MD   3 mL at 12/05/15 0944  . sodium chloride flush (NS) 0.9 % injection 3 mL  3 mL Intravenous Q12H Hillary Bow, MD   3 mL at 12/05/15 0944  . sodium chloride flush (NS) 0.9 % injection 3 mL  3 mL Intravenous PRN Srikar Sudini, MD      . vancomycin (VANCOCIN) 1,750 mg in sodium chloride 0.9 % 500 mL IVPB  1,750 mg Intravenous Once Demetrios Loll, MD       . Derrill Memo ON 12/06/2015] vancomycin (VANCOCIN) IVPB 750 mg/150 ml premix  750 mg Intravenous Q M,W,F-HD Demetrios Loll, MD        Musculoskeletal: Strength & Muscle Tone: decreased Gait & Station: unable to stand Patient leans: N/A  Psychiatric Specialty Exam: Review of Systems  Unable to perform ROS: patient unresponsive    Blood pressure 152/96, pulse 79, temperature 98.4 F (36.9 C), temperature source Axillary, resp. rate 19, height '5\' 9"'$  (1.753 m), weight 78.9 kg (173 lb 15.1 oz), SpO2 97 %.Body mass index is 25.68 kg/(m^2).  General Appearance: Disheveled  Eye Contact::  None  Speech:  Garbled, Slow and Slurred  Volume:  Decreased  Mood:  Not stated  Affect:  Flat  Thought Process:  Negative  Orientation:  Negative  Thought Content:  Negative  Suicidal Thoughts:  Unknown  Homicidal Thoughts:  Unknown  Memory:  Negative  Judgement:  Negative  Insight:  Negative  Psychomotor Activity:  Decreased  Concentration:  Poor  Recall:  Poor  Fund of Knowledge:Poor  Language: Poor  Akathisia:  No  Handed:  Right  AIMS (if indicated):     Assets:  Housing  ADL's:  Impaired  Cognition: Impaired,  Severe  Sleep:      Treatment Plan Summary: Daily contact with patient to assess and evaluate symptoms and progress in treatment, Medication management and Plan ` 65 year old man with a suppose it history of bipolar disorder. Details currently lacking about past history. Currently he is unresponsive to interview. Nursing in the critical care unit believes that this is behavioral. Hard to tell from the interview. Could be delirium and dementia as well but again it's difficult to be sure. He has refused dialysis and is not eating. Not clear what the best intervention would be psychiatrically. I will go ahead and order Seroquel 100 mg at night they started on the reported history of bipolar depression. We will see if we can get him to take it. Will continue to follow up daily.  Disposition:  Daily contact with patient to assess and evaluate symptoms and progress in treatment, Medication management and Plan Seen above  Alethia Berthold, MD 12/05/2015 6:25 PM

## 2015-12-05 NOTE — Progress Notes (Signed)
Babyboy Petruccelli is a 65 y.o. male  [id number]  Primary Cardiologist: Neoma Laming Reason for Consultation: Atrial fibrillation  HPI: This is a 65 year old African-American male with past medical history of renal failure diabetes hypertension COPD coronary artery disease who presented to the emergency room with back pain and shortness of breath. Patient states that even though he's been treated for atrial fibrillation he's still feeling very short of breath. His BUN and creatinine was very high along with hyperkalemia.   Review of Systems: Patient is having orthopnea PND and palpitation but no chest pain   Past Medical History  Diagnosis Date  . Cancer ([place])   . Arthritis   . Renal disorder   . Hypertension   . Diabetes mellitus without complication ([place])   . Asthma   . COPD (chronic obstructive pulmonary disease) ([place])   . Coronary artery disease     Medications Prior to Admission  Medication Sig Dispense Refill  . acetaminophen (TYLENOL) 325 MG tablet Take 650 mg by mouth every 6 (six) hours as needed for mild pain, fever or headache.    Marland Kitchen aspirin 81 MG chewable tablet Chew 1 tablet (81 mg total) by mouth daily. 30 tablet 11  . b complex-vitamin c-folic acid (NEPHRO-VITE) 0.8 MG TABS tablet Take 1 tablet by mouth 2 (two) times daily.    . capsaicin (ZOSTRIX) 0.025 % cream Apply 1 application topically 2 (two) times daily as needed (for lower back pain).    Marland Kitchen ipratropium-albuterol (DUONEB) 0.5-2.5 (3) MG/3ML SOLN Take 3 mLs by nebulization every 6 (six) hours as needed (for wheezing/shortness of breath).    . labetalol (NORMODYNE) 200 MG tablet Take 1 tablet (200 mg total) by mouth 3 (three) times daily. 90 tablet 0  . loratadine (CLARITIN) 10 MG tablet Take 1 tablet (10 mg total) by mouth daily. 30 tablet 11  . polyethylene glycol (MIRALAX / GLYCOLAX) packet Take 17 g by mouth daily as needed for mild constipation. 14 each 0  . sevelamer carbonate (RENVELA) 800 MG tablet Take  2,400 mg by mouth 3 (three) times daily with meals.    . Travoprost, BAK Free, (TRAVATAN) 0.004 % SOLN ophthalmic solution Place 1 drop into both eyes at bedtime.    . enalapril (VASOTEC) 5 MG tablet Take 1 tablet (5 mg total) by mouth daily. (Patient not taking: Reported on 12/04/2015) 30 tablet 0  . oxyCODONE-acetaminophen (ROXICET) 5-325 MG tablet Take 1 tablet by mouth every 6 (six) hours as needed. (Patient not taking: Reported on 12/04/2015) 15 tablet 0     . aspirin  81 mg Oral Daily  . docusate sodium  100 mg Oral BID  . insulin aspart  0-5 Units Subcutaneous QHS  . insulin aspart  0-9 Units Subcutaneous TID WC  . labetalol  200 mg Oral TID  . latanoprost  1 drop Both Eyes QHS  . sevelamer carbonate  2,400 mg Oral TID WC  . sodium chloride flush  3 mL Intravenous Q12H  . sodium chloride flush  3 mL Intravenous Q12H    Infusions: . diltiazem (CARDIZEM) infusion 7.5 mg/hr (12/05/15 1207)  . heparin 1,200 Units/hr (12/04/15 2008)    Allergies  Allergen Reactions  . Penicillins Other (See Comments)    Reaction:  Unknown     Social History   Social History  . Marital Status: Married    Spouse Name: N/A  . Number of Children: N/A  . Years of Education: N/A   Occupational History  .  Not on file.   Social History Main Topics  . Smoking status: Never Smoker   . Smokeless tobacco: Never Used  . Alcohol Use: No  . Drug Use: No  . Sexual Activity: Not on file   Other Topics Concern  . Not on file   Social History Narrative    Family History  Problem Relation Age of Onset  . Hypertension Other   . Diabetes Other     PHYSICAL EXAM: Filed Vitals:   12/05/15 0800 12/05/15 0942  BP:  181/105  Pulse:  97  Temp: 98.2 F (36.8 C)   Resp:       Intake/Output Summary (Last 24 hours) at 12/05/15 1344 Last data filed at 12/05/15 0944  Gross per 24 hour  Intake    135 ml  Output      0 ml  Net    135 ml    General:  Well appearing. No respiratory  difficulty HEENT: normal Neck: supple. no JVD. Carotids 2+ bilat; no bruits. No lymphadenopathy or thryomegaly appreciated. Cor: PMI nondisplaced. Regular rate & rhythm. No rubs, gallops or murmurs. Lungs: clear Abdomen: soft, nontender, nondistended. No hepatosplenomegaly. No bruits or masses. Good bowel sounds. Extremities: no cyanosis, clubbing, rash, edema Neuro: alert & oriented x 3, cranial nerves grossly intact. moves all 4 extremities w/o difficulty. Affect pleasant.  ECG: K fibrillation with rapid ventricular response rate 1 33 bpm with nonspecific ST-T changes especially in the inferolateral leads which suggests ischemia  Results for orders placed or performed during the hospital encounter of 12/04/15 (from the past 24 hour(s))  CBC     Status: Abnormal   Collection Time: 12/04/15  3:44 PM  Result Value Ref Range   WBC 8.5 3.8 - 10.6 K/uL   RBC 3.89 (L) 4.40 - 5.90 MIL/uL   Hemoglobin 11.1 (L) 13.0 - 18.0 g/dL   HCT 33.8 (L) 40.0 - 52.0 %   MCV 87.0 80.0 - 100.0 fL   MCH 28.6 26.0 - 34.0 pg   MCHC 32.8 32.0 - 36.0 g/dL   RDW 17.2 (H) 11.5 - 14.5 %   Platelets 154 150 - 440 K/uL  Comprehensive metabolic panel     Status: Abnormal   Collection Time: 12/04/15  3:44 PM  Result Value Ref Range   Sodium 133 (L) 135 - 145 mmol/L   Potassium 6.1 (H) 3.5 - 5.1 mmol/L   Chloride 97 (L) 101 - 111 mmol/L   CO2 19 (L) 22 - 32 mmol/L   Glucose, Bld 96 65 - 99 mg/dL   BUN 88 (H) 6 - 20 mg/dL   Creatinine, Ser 11.79 (H) 0.61 - 1.24 mg/dL   Calcium 8.5 (L) 8.9 - 10.3 mg/dL   Total Protein 7.7 6.5 - 8.1 g/dL   Albumin 3.4 (L) 3.5 - 5.0 g/dL   AST 33 15 - 41 U/L   ALT 24 17 - 63 U/L   Alkaline Phosphatase 84 38 - 126 U/L   Total Bilirubin 0.8 0.3 - 1.2 mg/dL   GFR calc non Af Amer 4 (L) >60 mL/min   GFR calc Af Amer 5 (L) >60 mL/min   Anion gap 17 (H) 5 - 15  Troponin I     Status: Abnormal   Collection Time: 12/04/15  3:44 PM  Result Value Ref Range   Troponin I 0.05 (H) <0.031  ng/mL  Glucose, capillary     Status: None   Collection Time: 12/04/15 10:36 PM  Result Value Ref Range  Glucose-Capillary 93 65 - 99 mg/dL   Comment 1 Notify RN   Potassium     Status: None   Collection Time: 12/04/15 10:40 PM  Result Value Ref Range   Potassium 5.0 3.5 - 5.1 mmol/L  Troponin I     Status: Abnormal   Collection Time: 12/04/15 10:40 PM  Result Value Ref Range   Troponin I 0.06 (H) <0.031 ng/mL  TSH     Status: Abnormal   Collection Time: 12/04/15 10:40 PM  Result Value Ref Range   TSH 7.083 (H) 0.350 - 4.500 uIU/mL  APTT     Status: Abnormal   Collection Time: 12/04/15 10:40 PM  Result Value Ref Range   aPTT 53 (H) 24 - 36 seconds  Protime-INR     Status: Abnormal   Collection Time: 12/04/15 10:40 PM  Result Value Ref Range   Prothrombin Time 15.2 (H) 11.4 - 15.0 seconds   INR 1.18   MRSA PCR Screening     Status: None   Collection Time: 12/04/15 10:40 PM  Result Value Ref Range   MRSA by PCR NEGATIVE NEGATIVE  Troponin I     Status: Abnormal   Collection Time: 12/05/15  4:22 AM  Result Value Ref Range   Troponin I 0.05 (H) <0.031 ng/mL  Basic metabolic panel     Status: Abnormal   Collection Time: 12/05/15  4:22 AM  Result Value Ref Range   Sodium 132 (L) 135 - 145 mmol/L   Potassium 4.7 3.5 - 5.1 mmol/L   Chloride 98 (L) 101 - 111 mmol/L   CO2 16 (L) 22 - 32 mmol/L   Glucose, Bld 126 (H) 65 - 99 mg/dL   BUN 97 (H) 6 - 20 mg/dL   Creatinine, Ser 12.44 (H) 0.61 - 1.24 mg/dL   Calcium 8.0 (L) 8.9 - 10.3 mg/dL   GFR calc non Af Amer 4 (L) >60 mL/min   GFR calc Af Amer 4 (L) >60 mL/min   Anion gap 18 (H) 5 - 15  CBC     Status: Abnormal   Collection Time: 12/05/15  4:22 AM  Result Value Ref Range   WBC 7.8 3.8 - 10.6 K/uL   RBC 3.86 (L) 4.40 - 5.90 MIL/uL   Hemoglobin 11.0 (L) 13.0 - 18.0 g/dL   HCT 33.4 (L) 40.0 - 52.0 %   MCV 86.4 80.0 - 100.0 fL   MCH 28.4 26.0 - 34.0 pg   MCHC 32.8 32.0 - 36.0 g/dL   RDW 17.0 (H) 11.5 - 14.5 %   Platelets  148 (L) 150 - 440 K/uL  Heparin level (unfractionated)     Status: None   Collection Time: 12/05/15  4:22 AM  Result Value Ref Range   Heparin Unfractionated 0.39 0.30 - 0.70 IU/mL  Glucose, capillary     Status: Abnormal   Collection Time: 12/05/15  7:33 AM  Result Value Ref Range   Glucose-Capillary 126 (H) 65 - 99 mg/dL  Glucose, capillary     Status: Abnormal   Collection Time: 12/05/15 12:04 PM  Result Value Ref Range   Glucose-Capillary 107 (H) 65 - 99 mg/dL   Dg Chest 2 View  12/04/2015  CLINICAL DATA:  Shortness of breath.  Chronic renal failure EXAM: CHEST  2 VIEW COMPARISON:  September 20, 2015 FINDINGS: Dialysis catheter placed from a femoral location has its tip in the right atrium. No pneumothorax. There is no edema or consolidation. Heart is mildly enlarged with pulmonary vascularity within  normal limits. No adenopathy. There is a left subclavian stent. There is degenerative change in the thoracic spine. IMPRESSION: No edema or consolidation. Cardiomegaly is stable. Central catheter tip is in the right atrium. No pneumothorax. Electronically Signed   By: Lowella Grip III M.D.   On: 12/04/2015 14:55   Dg Lumbar Spine 2-3 Views  12/04/2015  CLINICAL DATA:  Low back pain for approximately 2 days. No known injury. Initial encounter. EXAM: LUMBAR SPINE - 2-3 VIEW COMPARISON:  CT abdomen and pelvis 09/20/2015. FINDINGS: Vertebral body height and alignment are maintained. Anterior endplate spurring lower thoracic spine is noted. Intervertebral disc space height is normal. Dialysis catheter from a right groin approach is noted. IMPRESSION: No acute abnormality.  Mild appearing lower thoracic spondylosis. Electronically Signed   By: Inge Rise M.D.   On: 12/04/2015 14:54     ASSESSMENT AND PLAN:Atrial fibrillation with controlled ventricular response rate on Cardizem with ventricular rate right now 80-92 bpm. Patient appears to be fluid overloaded with renal failure and  hyperkalemia is about to get dialysis. Dialysis hopefully will make his shortness of breath better. He probably also has sleep apnea and should have a sleep study. Also we will locate echocardiogram to evaluate his ejection fraction. Advise changing to by mouth Cardizem after dialysis and transferring him to telemetry. He is not a good candidate for cardioversion as probably has underlying sleep apnea and will go back to atrial fibrillation.  Yochanan Eddleman A

## 2015-12-06 ENCOUNTER — Inpatient Hospital Stay: Payer: Medicaid Other

## 2015-12-06 DIAGNOSIS — H9193 Unspecified hearing loss, bilateral: Secondary | ICD-10-CM

## 2015-12-06 DIAGNOSIS — M549 Dorsalgia, unspecified: Secondary | ICD-10-CM

## 2015-12-06 DIAGNOSIS — E1122 Type 2 diabetes mellitus with diabetic chronic kidney disease: Secondary | ICD-10-CM

## 2015-12-06 DIAGNOSIS — Z515 Encounter for palliative care: Secondary | ICD-10-CM

## 2015-12-06 DIAGNOSIS — Z794 Long term (current) use of insulin: Secondary | ICD-10-CM

## 2015-12-06 DIAGNOSIS — F1910 Other psychoactive substance abuse, uncomplicated: Secondary | ICD-10-CM

## 2015-12-06 DIAGNOSIS — B1920 Unspecified viral hepatitis C without hepatic coma: Secondary | ICD-10-CM

## 2015-12-06 DIAGNOSIS — Z9115 Patient's noncompliance with renal dialysis: Secondary | ICD-10-CM

## 2015-12-06 DIAGNOSIS — N186 End stage renal disease: Secondary | ICD-10-CM

## 2015-12-06 DIAGNOSIS — I480 Paroxysmal atrial fibrillation: Secondary | ICD-10-CM

## 2015-12-06 DIAGNOSIS — F329 Major depressive disorder, single episode, unspecified: Secondary | ICD-10-CM

## 2015-12-06 DIAGNOSIS — Z992 Dependence on renal dialysis: Secondary | ICD-10-CM

## 2015-12-06 DIAGNOSIS — Z9114 Patient's other noncompliance with medication regimen: Secondary | ICD-10-CM

## 2015-12-06 DIAGNOSIS — E785 Hyperlipidemia, unspecified: Secondary | ICD-10-CM

## 2015-12-06 DIAGNOSIS — I120 Hypertensive chronic kidney disease with stage 5 chronic kidney disease or end stage renal disease: Secondary | ICD-10-CM

## 2015-12-06 DIAGNOSIS — Z79899 Other long term (current) drug therapy: Secondary | ICD-10-CM

## 2015-12-06 DIAGNOSIS — E875 Hyperkalemia: Secondary | ICD-10-CM

## 2015-12-06 DIAGNOSIS — J449 Chronic obstructive pulmonary disease, unspecified: Secondary | ICD-10-CM

## 2015-12-06 LAB — CBC
HCT: 31.1 % — ABNORMAL LOW (ref 40.0–52.0)
HEMATOCRIT: 30.3 % — AB (ref 40.0–52.0)
HEMOGLOBIN: 10.3 g/dL — AB (ref 13.0–18.0)
Hemoglobin: 9.9 g/dL — ABNORMAL LOW (ref 13.0–18.0)
MCH: 28.5 pg (ref 26.0–34.0)
MCH: 28.3 pg (ref 26.0–34.0)
MCHC: 33 g/dL (ref 32.0–36.0)
MCHC: 32.8 g/dL (ref 32.0–36.0)
MCV: 86.4 fL (ref 80.0–100.0)
MCV: 86.2 fL (ref 80.0–100.0)
PLATELETS: 126 10*3/uL — AB (ref 150–440)
PLATELETS: 143 10*3/uL — AB (ref 150–440)
RBC: 3.6 MIL/uL — AB (ref 4.40–5.90)
RBC: 3.52 MIL/uL — ABNORMAL LOW (ref 4.40–5.90)
RDW: 17.3 % — ABNORMAL HIGH (ref 11.5–14.5)
RDW: 17.2 % — AB (ref 11.5–14.5)
WBC: 8.4 10*3/uL (ref 3.8–10.6)
WBC: 11 10*3/uL — AB (ref 3.8–10.6)

## 2015-12-06 LAB — HEPARIN LEVEL (UNFRACTIONATED)
Heparin Unfractionated: 0.3 IU/mL (ref 0.30–0.70)
Heparin Unfractionated: 0.22 IU/mL — ABNORMAL LOW (ref 0.30–0.70)
Heparin Unfractionated: 0.29 IU/mL — ABNORMAL LOW (ref 0.30–0.70)

## 2015-12-06 LAB — AMMONIA: AMMONIA: 27 umol/L (ref 9–35)

## 2015-12-06 LAB — BASIC METABOLIC PANEL
Anion gap: 16 — ABNORMAL HIGH (ref 5–15)
BUN: 69 mg/dL — AB (ref 6–20)
CALCIUM: 7.4 mg/dL — AB (ref 8.9–10.3)
CO2: 22 mmol/L (ref 22–32)
CREATININE: 9.94 mg/dL — AB (ref 0.61–1.24)
Chloride: 93 mmol/L — ABNORMAL LOW (ref 101–111)
GFR calc Af Amer: 6 mL/min — ABNORMAL LOW (ref >60)
GFR, EST NON AFRICAN AMERICAN: 5 mL/min — AB (ref >60)
GLUCOSE: 112 mg/dL — AB (ref 65–99)
Potassium: 4.7 mmol/L (ref 3.5–5.1)
SODIUM: 131 mmol/L — AB (ref 135–145)

## 2015-12-06 LAB — BLOOD GAS, ARTERIAL
ACID-BASE EXCESS: 0.5 mmol/L (ref 0.0–3.0)
Allens test (pass/fail): POSITIVE — AB
BICARBONATE: 25.4 meq/L (ref 21.0–28.0)
FIO2: 1
O2 SAT: 91.4 %
PH ART: 7.4 (ref 7.350–7.450)
Patient temperature: 37
pCO2 arterial: 41 mmHg (ref 32.0–48.0)
pO2, Arterial: 62 mmHg — ABNORMAL LOW (ref 83.0–108.0)

## 2015-12-06 LAB — GLUCOSE, CAPILLARY
GLUCOSE-CAPILLARY: 91 mg/dL (ref 65–99)
Glucose-Capillary: 99 mg/dL (ref 65–99)
Glucose-Capillary: 111 mg/dL — ABNORMAL HIGH (ref 65–99)

## 2015-12-06 LAB — PROTIME-INR
INR: 1.21
PROTHROMBIN TIME: 15.5 s — AB (ref 11.4–15.0)

## 2015-12-06 LAB — VITAMIN B12: Vitamin B-12: 1038 pg/mL — ABNORMAL HIGH (ref 180–914)

## 2015-12-06 MED ORDER — QUETIAPINE FUMARATE 25 MG PO TABS
50.0000 mg | ORAL_TABLET | Freq: Every day | ORAL | Status: DC
  Administered 2015-12-06 – 2015-12-07 (×2): 50 mg via ORAL
  Filled 2015-12-06 (×2): qty 1

## 2015-12-06 MED ORDER — HEPARIN BOLUS VIA INFUSION
1150.0000 [IU] | Freq: Once | INTRAVENOUS | Status: AC
  Administered 2015-12-07: 1150 [IU] via INTRAVENOUS
  Filled 2015-12-06: qty 1150

## 2015-12-06 MED ORDER — HEPARIN (PORCINE) IN NACL 100-0.45 UNIT/ML-% IJ SOLN
1750.0000 [IU]/h | INTRAMUSCULAR | Status: DC
  Administered 2015-12-07: 1750 [IU]/h via INTRAVENOUS
  Filled 2015-12-06 (×2): qty 250

## 2015-12-06 MED ORDER — WARFARIN SODIUM 5 MG PO TABS
7.5000 mg | ORAL_TABLET | Freq: Every day | ORAL | Status: DC
  Filled 2015-12-06: qty 1

## 2015-12-06 MED ORDER — CHLORHEXIDINE GLUCONATE 0.12 % MT SOLN
15.0000 mL | Freq: Two times a day (BID) | OROMUCOSAL | Status: DC
  Administered 2015-12-06 – 2015-12-15 (×7): 15 mL via OROMUCOSAL
  Filled 2015-12-06 (×9): qty 15

## 2015-12-06 MED ORDER — SODIUM CHLORIDE 0.9 % IV SOLN
500.0000 mg | INTRAVENOUS | Status: DC
  Administered 2015-12-06: 500 mg via INTRAVENOUS
  Filled 2015-12-06 (×2): qty 0.5

## 2015-12-06 MED ORDER — DILTIAZEM HCL ER 90 MG PO CP12
90.0000 mg | ORAL_CAPSULE | Freq: Two times a day (BID) | ORAL | Status: DC
  Administered 2015-12-06 – 2015-12-07 (×4): 90 mg via ORAL
  Filled 2015-12-06 (×9): qty 1

## 2015-12-06 MED ORDER — CETYLPYRIDINIUM CHLORIDE 0.05 % MT LIQD
7.0000 mL | Freq: Two times a day (BID) | OROMUCOSAL | Status: DC
  Administered 2015-12-06: 7 mL via OROMUCOSAL

## 2015-12-06 MED ORDER — WARFARIN - PHARMACIST DOSING INPATIENT: Freq: Every day | Status: DC

## 2015-12-06 MED ORDER — HEPARIN BOLUS VIA INFUSION
1200.0000 [IU] | Freq: Once | INTRAVENOUS | Status: AC
  Administered 2015-12-06: 1200 [IU] via INTRAVENOUS
  Filled 2015-12-06: qty 1200

## 2015-12-06 MED ORDER — EPOETIN ALFA 10000 UNIT/ML IJ SOLN
4000.0000 [IU] | INTRAMUSCULAR | Status: DC
  Administered 2015-12-06 – 2015-12-15 (×5): 4000 [IU] via INTRAVENOUS

## 2015-12-06 NOTE — Progress Notes (Signed)
Hemodialysis start 

## 2015-12-06 NOTE — Progress Notes (Signed)
Pt was having sleep apnea so I placed a non-rebreather on the pt and notified resp.  Resp came and assess pt and he decided since pt was tolerating non-rebreather and sat's were ok we would leave him on non- rebreather and if pt started desating we would reevaluate and placed on bipap for sleep apnea.

## 2015-12-06 NOTE — Consult Note (Signed)
Palliative Medicine Inpatient Consult Note   Name: Noah Lewis Date: 12/06/2015 MRN: [id number]  DOB: 1950/10/06  Referring Physician: Hillary Bow, MD  Palliative Care consult requested for this 65 y.o. male for goals of medical therapy in patient with an initial complaint of back pain.   TODAY'S DISCUSSIONS AND DECISIONS: I was not able to get the pt to interact.  I was able to talk with pts attending and with psychiatry about his old history of noncompliance episodically.  Apparently, he has phases where he refuses meds or dialysis and then he will start being compliant again.  He may be able to communicate better if we speak loudly (as he has significant hearing loss bilaterally) and also after he has several days treatment for his staph bacteremia.    HISTORY AND REVIEW OF OLD RECORDS: He resides in a [facility] (not a 'Group Home' as mentioned in some notes --there is a distinction).  He had been refusing some of his dialysis sessions (leaving early and also refusing to go) --and had also been refusing meds. This apparently developed after he was informed that he might be legally limited in terms of contact with his children.  He was found here to have a potassium of 6.1 and he also had new onset AFib with RVr requiring Cardizem IV.  An  INFECTIOUS DISEASE CONSULT is underway for 'bacteremia' but I do not have access to those results in the computer so am not sure what is turing up.  He does have positive findings for Hep C apparently.   It is not known whether the situation regarding his access to his children is an accurate story --or not.  The patient's mother has been in trying to tell him this is not accurate, but others involved with the patient have informed staff here that there is some truth to this information.  The patient has not been interacting or talking with anyone, though he did agree to go for dialysis today (and is there now).   He was seen by psychiatry (Dr.  Weber Cooks) for possible depression.  He was started on Seroquel 100 mg qhs. He got this dose for the first time last night and due to some sedation, his attending has reduced this to 50 mg  by attending physician.    I have spoken with both pts attending, Dr Darvin Neighbours, and with Dr. Weber Cooks.  I have also talked with ICU nurses and Critical Care physician, Dr Alva Garnet. Cardiology stated he is a poor candidate for cardioversion and rec to change to oral Cardizem.    The main issue is the pts capacity to make decisions to not get treatment.  He certainly has the right to reject dialysis and meds, but if this is due to a depression or cognitive decline/ impairment, then this decision comes into question to some degree.    I reviewed his old records with Dr. Weber Cooks.  These show a history of polysubstance abuse including cocaine and marijuana and alcohol.  He has had a psychiatric stay in 2012 for several days. He also had an admission to a behavioral hospital in 2002 but records of that are not available.  Some notes mention him 'leaving AMA' and some mention 'noncompliance with medications'. A few notes mention that he has had 'cancer' in the past.  One note I reviewed says he has a h/o PROSTATE CANCER (note in 2007).  A note from August 2016 mentions that he was hospitalized then due to refusal of  dialysis.  Notes state that he had been refusing dialysis and he was at risk not being able to go to a SNF in [place] if he refused it one more time.  This note also mentions that he had been seen by psychiatry but did not have a med Rxd for his 'Bipolar Disorder'.  Psychiatry note states there was no clear evidence that he had Bipolar Disorder. He had been living with roommates in [facility], but when they moved out, he had to go to live in an ALF situation as his mother is not able to care for him in the home.    He is noted to be quite severely HARD OF HEARING.  Other notes say he is 'DEAF'.  He had an audiology  eval in 2016 showing moderate hearing loss at some frequencies and severe at other frequencies (bilaterally).  Hearing aids were recommended.  He has required CPAP in prior hospital stays.   The August 2016 note mentions 'NEW onset AFib' as a diagnosis.    IMPRESSION: ESRD --with refusal of HD recently after an emotionally upsetting event ---HD cath insertion was in 05/2015.  Hyperkalemia Back Pain Depression --has been diagnosed with this before ---record from 02/06/2011 mentions he had a h/o a suicide attempt yrs ago ---notes list one overdose on Clonidine Question of bipolar disorder ----last psychiatry note prior to this stay stated no evidence he is bipolar H/O refusal of HD and h/o leaving AMA and h/o medical noncompliance Profound Hearing Loss bilaterally Apparent h/o prostate cancer CAD with MI in 1986 AFIB with RVR --apparently paroxysmal as he did this in Aug 2016 also HTN COPD/ asthma --with h/o smoking tobacco per previous records (though current notes say 'never smoker') H/O Polysubstance Abuse ---alcohol, cocaine, and marijuana Dyslipidemia Past H/O CHF (details not elaborated upon in old notes) H/O Diverticultis with bowel perforation H/O scrotal abscess --treated surgically Prior h/o elevated LFTs Suspected Sleep Apnea (as per cardiology consult by Dr. Humphrey Rolls)   REVIEW OF SYSTEMS:  Patient is not able to provide ROS due to limited ability or willingness to talk or interact  Man: Yes --his family.  SOCIAL HISTORY:  reports that he has never smoked. He has never used smokeless tobacco. He reports that he does not drink alcohol or use illicit drugs. He resides in a [facility] (not that same as a '[facility]').  He gets HD on T T Sat.  He has in the past lived with roommates in [place].  He got dialysis in [place], [place] in the past.  He has lived in shelters and has also been in SNFs and now is in the [facility].  His children are  reported to be age 4 and 35 and nursing says that DSS called and reported that he has lost partial custody of his children.  I do not know details.  He has been incarcerated in the past for sale of drugs.    LEGAL DOCUMENTS:  none  CODE STATUS: Full code  PAST MEDICAL HISTORY: Past Medical History  Diagnosis Date  . Cancer ([place])   . Arthritis   . Renal disorder   . Hypertension   . Diabetes mellitus without complication ([place])   . Asthma   . COPD (chronic obstructive pulmonary disease) ([place])   . Coronary artery disease     PAST SURGICAL HISTORY:  Past Surgical History  Procedure Laterality Date  . No past surgeries    . Insertion of  dialysis catheter    . Insertion of dialysis catheter N/A 05/29/2015    Procedure: INSERTION OF DIALYSIS CATHETER;  Surgeon: Angelia Mould, MD;  Location: [facility];  Service: Vascular;  Laterality: N/A;  . Peripheral vascular catheterization N/A 10/25/2015    Procedure: Dialysis/Perma Catheter Insertion;  Surgeon: Katha Cabal, MD;  Location: [facility];  Service: Cardiovascular;  Laterality: N/A;    ALLERGIES:  is allergic to penicillins.  MEDICATIONS:  Current Facility-Administered Medications  Medication Dose Route Frequency Provider Last Rate Last Dose  . 0.9 %  sodium chloride infusion  250 mL Intravenous PRN Srikar Sudini, MD 10 mL/hr at 12/06/15 0700 250 mL at 12/06/15 0700  . acetaminophen (TYLENOL) tablet 650 mg  650 mg Oral Q6H PRN Hillary Bow, MD       Or  . acetaminophen (TYLENOL) suppository 650 mg  650 mg Rectal Q6H PRN Srikar Sudini, MD      . antiseptic oral rinse (CPC / CETYLPYRIDINIUM CHLORIDE 0.05%) solution 7 mL  7 mL Mouth Rinse q12n4p Lance Coon, MD   7 mL at 12/06/15 1250  . aspirin chewable tablet 81 mg  81 mg Oral Daily Hillary Bow, MD   81 mg at 12/06/15 1131  . chlorhexidine (PERIDEX) 0.12 % solution 15 mL  15 mL Mouth Rinse BID Lance Coon, MD   15 mL at 12/06/15 1130  . diltiazem (CARDIZEM SR)  12 hr capsule 90 mg  90 mg Oral Q12H Wilhelmina Mcardle, MD   90 mg at 12/06/15 1250  . docusate sodium (COLACE) capsule 100 mg  100 mg Oral BID Hillary Bow, MD   100 mg at 12/06/15 1131  . epoetin alfa (EPOGEN,PROCRIT) injection 4,000 Units  4,000 Units Intravenous Q M,W,F-HD Harmeet Singh, MD      . heparin ADULT infusion 100 units/mL (25000 units/250 mL)  1,650 Units/hr Intravenous Continuous Srikar Sudini, MD 16.5 mL/hr at 12/06/15 1412 1,650 Units/hr at 12/06/15 1412  . HYDROcodone-acetaminophen (NORCO/VICODIN) 5-325 MG per tablet 1-2 tablet  1-2 tablet Oral Q4H PRN Srikar Sudini, MD      . insulin aspart (novoLOG) injection 0-5 Units  0-5 Units Subcutaneous QHS Hillary Bow, MD   0 Units at 12/04/15 2258  . insulin aspart (novoLOG) injection 0-9 Units  0-9 Units Subcutaneous TID WC Hillary Bow, MD   1 Units at 12/05/15 0844  . ipratropium-albuterol (DUONEB) 0.5-2.5 (3) MG/3ML nebulizer solution 3 mL  3 mL Nebulization Q6H PRN Srikar Sudini, MD      . labetalol (NORMODYNE) tablet 200 mg  200 mg Oral TID Hillary Bow, MD   200 mg at 12/06/15 1312  . labetalol (NORMODYNE,TRANDATE) injection 10 mg  10 mg Intravenous Q2H PRN Murlean Iba, MD   10 mg at 12/06/15 1306  . latanoprost (XALATAN) 0.005 % ophthalmic solution 1 drop  1 drop Both Eyes QHS Hillary Bow, MD   1 drop at 12/05/15 2217  . meropenem (MERREM) 500 mg in sodium chloride 0.9 % 50 mL IVPB  500 mg Intravenous Q24H Hillary Bow, MD   500 mg at 12/06/15 1251  . ondansetron (ZOFRAN) tablet 4 mg  4 mg Oral Q6H PRN Hillary Bow, MD       Or  . ondansetron (ZOFRAN) injection 4 mg  4 mg Intravenous Q6H PRN Hillary Bow, MD   4 mg at 12/05/15 0853  . polyethylene glycol (MIRALAX / GLYCOLAX) packet 17 g  17 g Oral Daily PRN Hillary Bow, MD      .  QUEtiapine (SEROQUEL) tablet 50 mg  50 mg Oral QHS Srikar Sudini, MD      . sevelamer carbonate (RENVELA) tablet 2,400 mg  2,400 mg Oral TID WC Hillary Bow, MD   2,400 mg at 12/06/15 0826   . sodium chloride flush (NS) 0.9 % injection 3 mL  3 mL Intravenous Q12H Hillary Bow, MD   3 mL at 12/05/15 0944  . sodium chloride flush (NS) 0.9 % injection 3 mL  3 mL Intravenous Q12H Hillary Bow, MD   3 mL at 12/05/15 0944  . sodium chloride flush (NS) 0.9 % injection 3 mL  3 mL Intravenous PRN Srikar Sudini, MD      . vancomycin (VANCOCIN) IVPB 750 mg/150 ml premix  750 mg Intravenous Q M,W,F-HD Demetrios Loll, MD        Vital Signs: BP 127/64 mmHg  Pulse 83  Temp(Src) 98.2 F (36.8 C) (Oral)  Resp 18  Ht 5\' 9"  (1.753 m)  Wt 77.8 kg (171 lb 8.3 oz)  BMI 25.32 kg/m2  SpO2 93% Filed Weights   12/04/15 1354 12/05/15 1350 12/06/15 0500  Weight: 78.926 kg (174 lb) 78.9 kg (173 lb 15.1 oz) 77.8 kg (171 lb 8.3 oz)    Estimated body mass index is 25.32 kg/(m^2) as calculated from the following:   Height as of this encounter: 5\' 9"  (1.753 m).   Weight as of this encounter: 77.8 kg (171 lb 8.3 oz).  PERFORMANCE STATUS (ECOG) : 4 - Bedbound  PHYSICAL EXAM: Lying in bed  Hard to arouse --but does open eyes and he looks at me but does not interact He focuses briefly then closes eyes No JVD or TM Hrt rrr no mgr Lungs decreased BS in bases Abd soft and NT Ext leg muscles are wasted   LABS: CBC:    Component Value Date/Time   WBC 11.0* 12/06/2015 1309   HGB 9.9* 12/06/2015 1309   HCT 30.3* 12/06/2015 1309   PLT 143* 12/06/2015 1309   MCV 86.2 12/06/2015 1309   NEUTROABS 4.0 05/29/2015 1450   LYMPHSABS 0.8 05/29/2015 1450   MONOABS 0.7 05/29/2015 1450   EOSABS 0.3 05/29/2015 1450   BASOSABS 0.0 05/29/2015 1450   Comprehensive Metabolic Panel:    Component Value Date/Time   NA 131* 12/06/2015 1309   K 4.7 12/06/2015 1309   CL 93* 12/06/2015 1309   CO2 22 12/06/2015 1309   BUN 69* 12/06/2015 1309   CREATININE 9.94* 12/06/2015 1309   GLUCOSE 112* 12/06/2015 1309   CALCIUM 7.4* 12/06/2015 1309   AST 33 12/04/2015 1544   ALT 24 12/04/2015 1544   ALKPHOS 84 12/04/2015  1544   BILITOT 0.8 12/04/2015 1544   PROT 7.7 12/04/2015 1544   ALBUMIN 3.2* 12/05/2015 1507    ECHO: - Left ventricle: The cavity size was normal. There was mild  concentric hypertrophy. Systolic function was normal. The  estimated ejection fraction was in the range of 50% to 55%.  Regional wall motion abnormalities cannot be excluded. The study  is not technically sufficient to allow evaluation of LV diastolic  function. - Aortic valve: There was mild regurgitation. - Mitral valve: There was mild regurgitation. - Left atrium: The atrium was mildly to moderately dilated. - Right ventricle: Systolic function was mildly reduced. - Pulmonary arteries: Systolic pressure was mild to moderately  elevated. - Inferior vena cava: The vessel was normal in size. - Rhythm is atrial fibrillation.  More than 50% of the visit was spent in counseling/coordination  of care: Yes  Time Spent: 55 minutes

## 2015-12-06 NOTE — Progress Notes (Signed)
ANTICOAGULATION CONSULT NOTE - Initial Consult  Pharmacy Consult for Coumadin Indication: atrial fibrillation  Allergies  Allergen Reactions  . Penicillins Other (See Comments)    Reaction:  Unknown     Patient Measurements: Height: 5\' 9"  (175.3 cm) Weight: 171 lb 8.3 oz (77.8 kg) IBW/kg (Calculated) : 70.7   Vital Signs: Temp: 98.1 F (36.7 C) (03/15 1512) Temp Source: Oral (03/15 1512) BP: 99/58 mmHg (03/15 1512) Pulse Rate: 62 (03/15 1500)  Labs:  Recent Labs  12/04/15 1544 12/04/15 2240  12/05/15 0422 12/05/15 1507 12/06/15 0245 12/06/15 1309  HGB 11.1*  --   --  11.0*  --  10.3* 9.9*  HCT 33.8*  --   --  33.4*  --  31.1* 30.3*  PLT 154  --   --  148*  --  126* 143*  APTT  --  53*  --   --   --   --   --   LABPROT  --  15.2*  --   --   --   --   --   INR  --  1.18  --   --   --   --   --   HEPARINUNFRC  --   --   < > 0.39 0.11* 0.30 0.22*  CREATININE 11.79*  --   --  12.44* 8.39*  --  9.94*  TROPONINI 0.05* 0.06*  --  0.05*  --   --   --   < > = values in this interval not displayed.  Estimated Creatinine Clearance: 7.5 mL/min (by C-G formula based on Cr of 9.94).   Medical History: Past Medical History  Diagnosis Date  . Cancer ([place])   . Arthritis   . Renal disorder   . Hypertension   . Diabetes mellitus without complication ([place])   . Asthma   . COPD (chronic obstructive pulmonary disease) ([place])   . Coronary artery disease     Medications:  Scheduled:  . antiseptic oral rinse  7 mL Mouth Rinse q12n4p  . aspirin  81 mg Oral Daily  . chlorhexidine  15 mL Mouth Rinse BID  . diltiazem  90 mg Oral Q12H  . docusate sodium  100 mg Oral BID  . epoetin (EPOGEN/PROCRIT) injection  4,000 Units Intravenous Q M,W,F-HD  . insulin aspart  0-5 Units Subcutaneous QHS  . insulin aspart  0-9 Units Subcutaneous TID WC  . labetalol  200 mg Oral TID  . latanoprost  1 drop Both Eyes QHS  . meropenem (MERREM) IV  500 mg Intravenous Q24H  . QUEtiapine  50 mg Oral  QHS  . sevelamer carbonate  2,400 mg Oral TID WC  . sodium chloride flush  3 mL Intravenous Q12H  . sodium chloride flush  3 mL Intravenous Q12H  . vancomycin  750 mg Intravenous Q M,W,F-HD  . warfarin  7.5 mg Oral q1800  . Warfarin - Pharmacist Dosing Inpatient   Does not apply q1800   Infusions:  . heparin 1,650 Units/hr (12/06/15 1412)    Assessment: 65 y/o M with new-onset atrial fibrillation on heparin drip to bridge to Coumadin.   Goal of Therapy:  INR 2-3  Plan:  Will begin warfarin 7.5 mg daily and f/u AM INR.   Ulice Dash D 12/06/2015,3:25 PM

## 2015-12-06 NOTE — Consult Note (Signed)
[facility] Infectious Disease     Reason for Consult: Bacteremia   Referring Physician: Guy Sandifer Date of Admission:  12/04/2015   Active Problems:   Bipolar depression ([place])   A-fib ([place])   HPI: Noah Lewis is a 65 y.o. male with ESRD on HD through L UE AV fistula admitted 3/13 after missing HD. Has been depressed, minimally responsive. Did complain of back pain Has been on meropenem and vanco.  Spoke with Dr Candiss Norse re cx with S aureus. Sensis pending  Past Medical History  Diagnosis Date  . Cancer ([place])   . Arthritis   . Renal disorder   . Hypertension   . Diabetes mellitus without complication ([place])   . Asthma   . COPD (chronic obstructive pulmonary disease) ([place])   . Coronary artery disease    Past Surgical History  Procedure Laterality Date  . No past surgeries    . Insertion of dialysis catheter    . Insertion of dialysis catheter N/A 05/29/2015    Procedure: INSERTION OF DIALYSIS CATHETER;  Surgeon: Angelia Mould, MD;  Location: [facility];  Service: Vascular;  Laterality: N/A;  . Peripheral vascular catheterization N/A 10/25/2015    Procedure: Dialysis/Perma Catheter Insertion;  Surgeon: Katha Cabal, MD;  Location: [facility];  Service: Cardiovascular;  Laterality: N/A;   Social History  Substance Use Topics  . Smoking status: Never Smoker   . Smokeless tobacco: Never Used  . Alcohol Use: No   Family History  Problem Relation Age of Onset  . Hypertension Other   . Diabetes Other     Allergies:  Allergies  Allergen Reactions  . Penicillins Other (See Comments)    Reaction:  Unknown     Current antibiotics: Antibiotics Given (last 72 hours)    Date/Time Action Medication Dose Rate   12/05/15 1846 Given   vancomycin (VANCOCIN) 1,750 mg in sodium chloride 0.9 % 500 mL IVPB 1,750 mg 250 mL/hr   12/06/15 1251 Given   meropenem (MERREM) 500 mg in sodium chloride 0.9 % 50 mL IVPB 500 mg 100 mL/hr      MEDICATIONS: . antiseptic oral  rinse  7 mL Mouth Rinse q12n4p  . aspirin  81 mg Oral Daily  . chlorhexidine  15 mL Mouth Rinse BID  . diltiazem  90 mg Oral Q12H  . docusate sodium  100 mg Oral BID  . epoetin (EPOGEN/PROCRIT) injection  4,000 Units Intravenous Q M,W,F-HD  . insulin aspart  0-5 Units Subcutaneous QHS  . insulin aspart  0-9 Units Subcutaneous TID WC  . labetalol  200 mg Oral TID  . latanoprost  1 drop Both Eyes QHS  . meropenem (MERREM) IV  500 mg Intravenous Q24H  . QUEtiapine  50 mg Oral QHS  . sevelamer carbonate  2,400 mg Oral TID WC  . sodium chloride flush  3 mL Intravenous Q12H  . sodium chloride flush  3 mL Intravenous Q12H  . vancomycin  750 mg Intravenous Q M,W,F-HD    Review of Systems - unable to obtain  OBJECTIVE: Temp:  [98.1 F (36.7 C)-98.4 F (36.9 C)] 98.2 F (36.8 C) (03/15 1200) Pulse Rate:  [37-129] 83 (03/15 1312) Resp:  [16-28] 18 (03/15 1300) BP: (101-172)/(64-126) 127/64 mmHg (03/15 1312) SpO2:  [76 %-100 %] 93 % (03/15 1300) Weight:  [77.8 kg (171 lb 8.3 oz)] 77.8 kg (171 lb 8.3 oz) (03/15 0500) Physical Exam  Constitutional: lethargic barely arousable HENT: muddy sclera Mouth/Throat: Oropharynx is clear  and dry. No oropharyngeal exudate.  Cardiovascular: Distant, No murmur heard.  Pulmonary/Chest: Effort normal and breath sounds normal. No respiratory distress. He has no wheezes.  Abdominal: Soft. Obese Bowel sounds are normal. He exhibits no distension. There is no tenderness.  Lymphadenopathy:  He has no cervical adenopathy.  Neurological: lethargic Skin: Skin is warm and dry. LUE with chronic edema, L avf site with no tenderness or erythema noted  Psychiatric: lethargic   LABS: Results for orders placed or performed during the hospital encounter of 12/04/15 (from the past 48 hour(s))  CBC     Status: Abnormal   Collection Time: 12/04/15  3:44 PM  Result Value Ref Range   WBC 8.5 3.8 - 10.6 K/uL   RBC 3.89 (L) 4.40 - 5.90 MIL/uL   Hemoglobin 11.1 (L)  13.0 - 18.0 g/dL   HCT 33.8 (L) 40.0 - 52.0 %   MCV 87.0 80.0 - 100.0 fL   MCH 28.6 26.0 - 34.0 pg   MCHC 32.8 32.0 - 36.0 g/dL   RDW 17.2 (H) 11.5 - 14.5 %   Platelets 154 150 - 440 K/uL  Comprehensive metabolic panel     Status: Abnormal   Collection Time: 12/04/15  3:44 PM  Result Value Ref Range   Sodium 133 (L) 135 - 145 mmol/L   Potassium 6.1 (H) 3.5 - 5.1 mmol/L   Chloride 97 (L) 101 - 111 mmol/L   CO2 19 (L) 22 - 32 mmol/L   Glucose, Bld 96 65 - 99 mg/dL   BUN 88 (H) 6 - 20 mg/dL   Creatinine, Ser 11.79 (H) 0.61 - 1.24 mg/dL   Calcium 8.5 (L) 8.9 - 10.3 mg/dL   Total Protein 7.7 6.5 - 8.1 g/dL   Albumin 3.4 (L) 3.5 - 5.0 g/dL   AST 33 15 - 41 U/L   ALT 24 17 - 63 U/L   Alkaline Phosphatase 84 38 - 126 U/L   Total Bilirubin 0.8 0.3 - 1.2 mg/dL   GFR calc non Af Amer 4 (L) >60 mL/min   GFR calc Af Amer 5 (L) >60 mL/min    Comment: (NOTE) The eGFR has been calculated using the CKD EPI equation. This calculation has not been validated in all clinical situations. eGFR's persistently <60 mL/min signify possible Chronic Kidney Disease.    Anion gap 17 (H) 5 - 15  Troponin I     Status: Abnormal   Collection Time: 12/04/15  3:44 PM  Result Value Ref Range   Troponin I 0.05 (H) <0.031 ng/mL    Comment: READ BACK AND VERIFIED WITH EMMA HUNTER AT 0347 12/04/2015 BY TFK        PERSISTENTLY INCREASED TROPONIN VALUES IN THE RANGE OF 0.04-0.49 ng/mL CAN BE SEEN IN:       -UNSTABLE ANGINA       -CONGESTIVE HEART FAILURE       -MYOCARDITIS       -CHEST TRAUMA       -ARRYHTHMIAS       -LATE PRESENTING MYOCARDIAL INFARCTION       -COPD   CLINICAL FOLLOW-UP RECOMMENDED.   Glucose, capillary     Status: None   Collection Time: 12/04/15 10:36 PM  Result Value Ref Range   Glucose-Capillary 93 65 - 99 mg/dL   Comment 1 Notify RN   Potassium     Status: None   Collection Time: 12/04/15 10:40 PM  Result Value Ref Range   Potassium 5.0 3.5 - 5.1 mmol/L  Troponin I      Status: Abnormal   Collection Time: 12/04/15 10:40 PM  Result Value Ref Range   Troponin I 0.06 (H) <0.031 ng/mL    Comment: PREVIOUS RESULT CALLED EMMA HUNTER AT 6203 ON 12/04/15 BY TFK/ VAB        PERSISTENTLY INCREASED TROPONIN VALUES IN THE RANGE OF 0.04-0.49 ng/mL CAN BE SEEN IN:       -UNSTABLE ANGINA       -CONGESTIVE HEART FAILURE       -MYOCARDITIS       -CHEST TRAUMA       -ARRYHTHMIAS       -LATE PRESENTING MYOCARDIAL INFARCTION       -COPD   CLINICAL FOLLOW-UP RECOMMENDED.   TSH     Status: Abnormal   Collection Time: 12/04/15 10:40 PM  Result Value Ref Range   TSH 7.083 (H) 0.350 - 4.500 uIU/mL  APTT     Status: Abnormal   Collection Time: 12/04/15 10:40 PM  Result Value Ref Range   aPTT 53 (H) 24 - 36 seconds    Comment:        IF BASELINE aPTT IS ELEVATED, SUGGEST PATIENT RISK ASSESSMENT BE USED TO DETERMINE APPROPRIATE ANTICOAGULANT THERAPY.   Protime-INR     Status: Abnormal   Collection Time: 12/04/15 10:40 PM  Result Value Ref Range   Prothrombin Time 15.2 (H) 11.4 - 15.0 seconds   INR 1.18   MRSA PCR Screening     Status: None   Collection Time: 12/04/15 10:40 PM  Result Value Ref Range   MRSA by PCR NEGATIVE NEGATIVE    Comment:        The GeneXpert MRSA Assay (FDA approved for NASAL specimens only), is one component of a comprehensive MRSA colonization surveillance program. It is not intended to diagnose MRSA infection nor to guide or monitor treatment for MRSA infections.   Troponin I     Status: Abnormal   Collection Time: 12/05/15  4:22 AM  Result Value Ref Range   Troponin I 0.05 (H) <0.031 ng/mL    Comment: PREVIOUS RESULT CALLED EMMA HUNTER AT 5597 ON 12/04/15 BY TFK/VAB        PERSISTENTLY INCREASED TROPONIN VALUES IN THE RANGE OF 0.04-0.49 ng/mL CAN BE SEEN IN:       -UNSTABLE ANGINA       -CONGESTIVE HEART FAILURE       -MYOCARDITIS       -CHEST TRAUMA       -ARRYHTHMIAS       -LATE PRESENTING MYOCARDIAL INFARCTION        -COPD   CLINICAL FOLLOW-UP RECOMMENDED.   Basic metabolic panel     Status: Abnormal   Collection Time: 12/05/15  4:22 AM  Result Value Ref Range   Sodium 132 (L) 135 - 145 mmol/L   Potassium 4.7 3.5 - 5.1 mmol/L   Chloride 98 (L) 101 - 111 mmol/L   CO2 16 (L) 22 - 32 mmol/L   Glucose, Bld 126 (H) 65 - 99 mg/dL   BUN 97 (H) 6 - 20 mg/dL   Creatinine, Ser 12.44 (H) 0.61 - 1.24 mg/dL   Calcium 8.0 (L) 8.9 - 10.3 mg/dL   GFR calc non Af Amer 4 (L) >60 mL/min   GFR calc Af Amer 4 (L) >60 mL/min    Comment: (NOTE) The eGFR has been calculated using the CKD EPI equation. This calculation has not been validated in all clinical situations. eGFR's persistently <  60 mL/min signify possible Chronic Kidney Disease.    Anion gap 18 (H) 5 - 15  CBC     Status: Abnormal   Collection Time: 12/05/15  4:22 AM  Result Value Ref Range   WBC 7.8 3.8 - 10.6 K/uL   RBC 3.86 (L) 4.40 - 5.90 MIL/uL   Hemoglobin 11.0 (L) 13.0 - 18.0 g/dL   HCT 33.4 (L) 40.0 - 52.0 %   MCV 86.4 80.0 - 100.0 fL   MCH 28.4 26.0 - 34.0 pg   MCHC 32.8 32.0 - 36.0 g/dL   RDW 17.0 (H) 11.5 - 14.5 %   Platelets 148 (L) 150 - 440 K/uL  Heparin level (unfractionated)     Status: None   Collection Time: 12/05/15  4:22 AM  Result Value Ref Range   Heparin Unfractionated 0.39 0.30 - 0.70 IU/mL    Comment:        IF HEPARIN RESULTS ARE BELOW EXPECTED VALUES, AND PATIENT DOSAGE HAS BEEN CONFIRMED, SUGGEST FOLLOW UP TESTING OF ANTITHROMBIN III LEVELS.   Glucose, capillary     Status: Abnormal   Collection Time: 12/05/15  7:33 AM  Result Value Ref Range   Glucose-Capillary 126 (H) 65 - 99 mg/dL  Glucose, capillary     Status: Abnormal   Collection Time: 12/05/15 12:04 PM  Result Value Ref Range   Glucose-Capillary 107 (H) 65 - 99 mg/dL  Renal function panel     Status: Abnormal   Collection Time: 12/05/15  3:07 PM  Result Value Ref Range   Sodium 133 (L) 135 - 145 mmol/L   Potassium 3.8 3.5 - 5.1 mmol/L   Chloride 95  (L) 101 - 111 mmol/L   CO2 26 22 - 32 mmol/L   Glucose, Bld 107 (H) 65 - 99 mg/dL   BUN 63 (H) 6 - 20 mg/dL   Creatinine, Ser 8.39 (H) 0.61 - 1.24 mg/dL   Calcium 8.1 (L) 8.9 - 10.3 mg/dL   Phosphorus 3.6 2.5 - 4.6 mg/dL   Albumin 3.2 (L) 3.5 - 5.0 g/dL   GFR calc non Af Amer 6 (L) >60 mL/min   GFR calc Af Amer 7 (L) >60 mL/min    Comment: (NOTE) The eGFR has been calculated using the CKD EPI equation. This calculation has not been validated in all clinical situations. eGFR's persistently <60 mL/min signify possible Chronic Kidney Disease.    Anion gap 12 5 - 15  Heparin level (unfractionated)     Status: Abnormal   Collection Time: 12/05/15  3:07 PM  Result Value Ref Range   Heparin Unfractionated 0.11 (L) 0.30 - 0.70 IU/mL    Comment:        IF HEPARIN RESULTS ARE BELOW EXPECTED VALUES, AND PATIENT DOSAGE HAS BEEN CONFIRMED, SUGGEST FOLLOW UP TESTING OF ANTITHROMBIN III LEVELS.   Glucose, capillary     Status: None   Collection Time: 12/05/15  5:08 PM  Result Value Ref Range   Glucose-Capillary 89 65 - 99 mg/dL  Glucose, capillary     Status: Abnormal   Collection Time: 12/05/15  9:21 PM  Result Value Ref Range   Glucose-Capillary 107 (H) 65 - 99 mg/dL  CULTURE, BLOOD (ROUTINE X 2) w Reflex to PCR ID Panel     Status: None (Preliminary result)   Collection Time: 12/05/15 10:00 PM  Result Value Ref Range   Specimen Description BLOOD RIGHT ANTECUBITAL    Special Requests BOTTLES DRAWN AEROBIC AND ANAEROBIC 5ML    Culture NO  GROWTH < 12 HOURS    Report Status PENDING   CULTURE, BLOOD (ROUTINE X 2) w Reflex to PCR ID Panel     Status: None (Preliminary result)   Collection Time: 12/05/15 10:05 PM  Result Value Ref Range   Specimen Description BLOOD BLOOD RIGHT FOREARM    Special Requests BOTTLES DRAWN AEROBIC AND ANAEROBIC 5ML    Culture NO GROWTH < 12 HOURS    Report Status PENDING   Glucose, capillary     Status: None   Collection Time: 12/05/15 10:16 PM  Result  Value Ref Range   Glucose-Capillary 98 65 - 99 mg/dL  Heparin level (unfractionated)     Status: None   Collection Time: 12/06/15  2:45 AM  Result Value Ref Range   Heparin Unfractionated 0.30 0.30 - 0.70 IU/mL    Comment:        IF HEPARIN RESULTS ARE BELOW EXPECTED VALUES, AND PATIENT DOSAGE HAS BEEN CONFIRMED, SUGGEST FOLLOW UP TESTING OF ANTITHROMBIN III LEVELS.   CBC     Status: Abnormal   Collection Time: 12/06/15  2:45 AM  Result Value Ref Range   WBC 8.4 3.8 - 10.6 K/uL   RBC 3.60 (L) 4.40 - 5.90 MIL/uL   Hemoglobin 10.3 (L) 13.0 - 18.0 g/dL   HCT 31.1 (L) 40.0 - 52.0 %   MCV 86.4 80.0 - 100.0 fL   MCH 28.5 26.0 - 34.0 pg   MCHC 33.0 32.0 - 36.0 g/dL   RDW 17.3 (H) 11.5 - 14.5 %   Platelets 126 (L) 150 - 440 K/uL  Glucose, capillary     Status: None   Collection Time: 12/06/15  8:01 AM  Result Value Ref Range   Glucose-Capillary 99 65 - 99 mg/dL  Ammonia     Status: None   Collection Time: 12/06/15  9:58 AM  Result Value Ref Range   Ammonia 27 9 - 35 umol/L  Blood gas, arterial     Status: Abnormal   Collection Time: 12/06/15 10:56 AM  Result Value Ref Range   FIO2 1.00    Delivery systems NON-REBREATHER OXYGEN MASK    pH, Arterial 7.40 7.350 - 7.450   pCO2 arterial 41 32.0 - 48.0 mmHg   pO2, Arterial 62 (L) 83.0 - 108.0 mmHg   Bicarbonate 25.4 21.0 - 28.0 mEq/L   Acid-Base Excess 0.5 0.0 - 3.0 mmol/L   O2 Saturation 91.4 %   Patient temperature 37.0    Collection site RIGHT RADIAL    Sample type ARTERIAL DRAW    Allens test (pass/fail) POSITIVE (A) PASS  Glucose, capillary     Status: Abnormal   Collection Time: 12/06/15 11:49 AM  Result Value Ref Range   Glucose-Capillary 111 (H) 65 - 99 mg/dL  Heparin level (unfractionated)     Status: Abnormal   Collection Time: 12/06/15  1:09 PM  Result Value Ref Range   Heparin Unfractionated 0.22 (L) 0.30 - 0.70 IU/mL    Comment:        IF HEPARIN RESULTS ARE BELOW EXPECTED VALUES, AND PATIENT DOSAGE HAS BEEN  CONFIRMED, SUGGEST FOLLOW UP TESTING OF ANTITHROMBIN III LEVELS.   Basic metabolic panel     Status: Abnormal   Collection Time: 12/06/15  1:09 PM  Result Value Ref Range   Sodium 131 (L) 135 - 145 mmol/L   Potassium 4.7 3.5 - 5.1 mmol/L   Chloride 93 (L) 101 - 111 mmol/L   CO2 22 22 - 32 mmol/L   Glucose,  Bld 112 (H) 65 - 99 mg/dL   BUN 69 (H) 6 - 20 mg/dL   Creatinine, Ser 9.94 (H) 0.61 - 1.24 mg/dL   Calcium 7.4 (L) 8.9 - 10.3 mg/dL   GFR calc non Af Amer 5 (L) >60 mL/min   GFR calc Af Amer 6 (L) >60 mL/min    Comment: (NOTE) The eGFR has been calculated using the CKD EPI equation. This calculation has not been validated in all clinical situations. eGFR's persistently <60 mL/min signify possible Chronic Kidney Disease.    Anion gap 16 (H) 5 - 15  CBC     Status: Abnormal   Collection Time: 12/06/15  1:09 PM  Result Value Ref Range   WBC 11.0 (H) 3.8 - 10.6 K/uL   RBC 3.52 (L) 4.40 - 5.90 MIL/uL   Hemoglobin 9.9 (L) 13.0 - 18.0 g/dL   HCT 30.3 (L) 40.0 - 52.0 %   MCV 86.2 80.0 - 100.0 fL   MCH 28.3 26.0 - 34.0 pg   MCHC 32.8 32.0 - 36.0 g/dL   RDW 17.2 (H) 11.5 - 14.5 %   Platelets 143 (L) 150 - 440 K/uL   No components found for: ESR, C REACTIVE PROTEIN MICRO: Recent Results (from the past 720 hour(s))  MRSA PCR Screening     Status: None   Collection Time: 12/04/15 10:40 PM  Result Value Ref Range Status   MRSA by PCR NEGATIVE NEGATIVE Final    Comment:        The GeneXpert MRSA Assay (FDA approved for NASAL specimens only), is one component of a comprehensive MRSA colonization surveillance program. It is not intended to diagnose MRSA infection nor to guide or monitor treatment for MRSA infections.   CULTURE, BLOOD (ROUTINE X 2) w Reflex to PCR ID Panel     Status: None (Preliminary result)   Collection Time: 12/05/15 10:00 PM  Result Value Ref Range Status   Specimen Description BLOOD RIGHT ANTECUBITAL  Final   Special Requests BOTTLES DRAWN AEROBIC AND  ANAEROBIC 5ML  Final   Culture NO GROWTH < 12 HOURS  Final   Report Status PENDING  Incomplete  CULTURE, BLOOD (ROUTINE X 2) w Reflex to PCR ID Panel     Status: None (Preliminary result)   Collection Time: 12/05/15 10:05 PM  Result Value Ref Range Status   Specimen Description BLOOD BLOOD RIGHT FOREARM  Final   Special Requests BOTTLES DRAWN AEROBIC AND ANAEROBIC 5ML  Final   Culture NO GROWTH < 12 HOURS  Final   Report Status PENDING  Incomplete    IMAGING: Dg Chest 2 View  12/04/2015  CLINICAL DATA:  Shortness of breath.  Chronic renal failure EXAM: CHEST  2 VIEW COMPARISON:  September 20, 2015 FINDINGS: Dialysis catheter placed from a femoral location has its tip in the right atrium. No pneumothorax. There is no edema or consolidation. Heart is mildly enlarged with pulmonary vascularity within normal limits. No adenopathy. There is a left subclavian stent. There is degenerative change in the thoracic spine. IMPRESSION: No edema or consolidation. Cardiomegaly is stable. Central catheter tip is in the right atrium. No pneumothorax. Electronically Signed   By: Lowella Grip III M.D.   On: 12/04/2015 14:55   Dg Lumbar Spine 2-3 Views  12/04/2015  CLINICAL DATA:  Low back pain for approximately 2 days. No known injury. Initial encounter. EXAM: LUMBAR SPINE - 2-3 VIEW COMPARISON:  CT abdomen and pelvis 09/20/2015. FINDINGS: Vertebral body height and alignment are maintained.  Anterior endplate spurring lower thoracic spine is noted. Intervertebral disc space height is normal. Dialysis catheter from a right groin approach is noted. IMPRESSION: No acute abnormality.  Mild appearing lower thoracic spondylosis. Electronically Signed   By: Inge Rise M.D.   On: 12/04/2015 14:54   Dg Chest Port 1 View  12/06/2015  CLINICAL DATA:  Hypoxia, history hypertension, diabetes mellitus, asthma, COPD, end-stage renal disease on dialysis, coronary artery disease EXAM: PORTABLE CHEST 1 VIEW COMPARISON:   Portable exam 0957 hours compared to 12/04/2015 FINDINGS: Dialysis catheter via infra diaphragmatic approach with tip projecting over RIGHT atrium. Wall stent identified at the LEFT brachiocephalic vein. Enlargement of cardiac silhouette with pulmonary vascular congestion. Mediastinal contours normal. Question mild atelectasis or infiltrate in retrocardiac LEFT lower lobe. Remaining lungs clear. No pleural effusion or pneumothorax. IMPRESSION: Enlargement of cardiac silhouette with pulmonary vascular congestion. Question mild atelectasis or infiltrate in retrocardiac LEFT lower lobe. Electronically Signed   By: Lavonia Dana M.D.   On: 12/06/2015 10:35    Assessment:   Noah Lewis is a 65 y.o. male with ESRD admitted after refusing HD and with back pain.  Now with back pain in setting of Staph aureus bacteremia (CX at HD with sensis pending).  FU bcx negative. TTE shows no vegetation. HAs been on vanco and meropenem  Recommendations Dc meropenem Cont vanco Check MRI spine given back pain (L spine at first)   Thank you very much for allowing me to participate in the care of this patient. Please call with questions.   Cheral Marker. Ola Spurr, MD

## 2015-12-06 NOTE — Progress Notes (Signed)
ANTICOAGULATION CONSULT NOTE - Follow up [facility] for Heparin  Indication: atrial fibrillation  Allergies  Allergen Reactions  . Penicillins Other (See Comments)    Reaction:  Unknown     Patient Measurements: Height: 5\' 9"  (175.3 cm) Weight: 170 lb 8 oz (77.338 kg) (upon transfer) IBW/kg (Calculated) : 70.7 Heparin Dosing Weight: 77.8 kg  Vital Signs: Temp: 98.2 F (36.8 C) (03/15 1902) Temp Source: Oral (03/15 1902) BP: 116/72 mmHg (03/15 1902) Pulse Rate: 79 (03/15 1902)  Labs:  Recent Labs  12/04/15 1544 12/04/15 2240  12/05/15 0422 12/05/15 1507 12/06/15 0245 12/06/15 1309 12/06/15 2216  HGB 11.1*  --   --  11.0*  --  10.3* 9.9*  --   HCT 33.8*  --   --  33.4*  --  31.1* 30.3*  --   PLT 154  --   --  148*  --  126* 143*  --   APTT  --  53*  --   --   --   --   --   --   LABPROT  --  15.2*  --   --   --   --  15.5*  --   INR  --  1.18  --   --   --   --  1.21  --   HEPARINUNFRC  --   --   < > 0.39 0.11* 0.30 0.22* 0.29*  CREATININE 11.79*  --   --  12.44* 8.39*  --  9.94*  --   TROPONINI 0.05* 0.06*  --  0.05*  --   --   --   --   < > = values in this interval not displayed.  Estimated Creatinine Clearance: 7.5 mL/min (by C-G formula based on Cr of 9.94).   Assessment: Pharmacy consulted to dose heparin in this 65 year old male admitted with new onset Afib.  No prior anti-coag noted.  Goal of Therapy:  Heparin level 0.3-0.7 units/ml Monitor platelets by anticoagulation protocol: Yes   Plan:  Heparin level of 0.29 is below goal so will bolus heparin 1150 units (~15 units/kg) once then increase infusion to 1750 units/hr. Will recheck another HL in 8 hours. CBC ordered with AM labs tomorrow.  [name], PharmD Clinical Pharmacist  12/06/2015 11:49 PM

## 2015-12-06 NOTE — Progress Notes (Signed)
Hemodialysis completed. 

## 2015-12-06 NOTE — Consult Note (Signed)
Palliative Care Update   Consult initiated.  Further work to do and conversations to be held.  See full note to follow after these are accomplished.  Have talked with nursing thus far.  Colleen Can, MD

## 2015-12-06 NOTE — Progress Notes (Signed)
Post hd tx 

## 2015-12-06 NOTE — Progress Notes (Signed)
  SUBJECTIVE: Pt lying in bed, pulling his oxygen mask off and won't answer my questions.    Filed Vitals:   12/06/15 0400 12/06/15 0500 12/06/15 0600 12/06/15 0700  BP: 127/78 114/70 133/79 140/88  Pulse: 93 82 84 84  Temp:      TempSrc:      Resp: 23 18 21 17   Height:      Weight:  171 lb 8.3 oz (77.8 kg)    SpO2: 96% 97% 100% 100%    Intake/Output Summary (Last 24 hours) at 12/06/15 1039 Last data filed at 12/06/15 0700  Gross per 24 hour  Intake 1170.77 ml  Output   2000 ml  Net -829.23 ml    LABS: Basic Metabolic Panel:  Recent Labs  12/05/15 0422 12/05/15 1507  NA 132* 133*  K 4.7 3.8  CL 98* 95*  CO2 16* 26  GLUCOSE 126* 107*  BUN 97* 63*  CREATININE 12.44* 8.39*  CALCIUM 8.0* 8.1*  PHOS  --  3.6   Liver Function Tests:  Recent Labs  12/04/15 1544 12/05/15 1507  AST 33  --   ALT 24  --   ALKPHOS 84  --   BILITOT 0.8  --   PROT 7.7  --   ALBUMIN 3.4* 3.2*   No results for input(s): LIPASE, AMYLASE in the last 72 hours. CBC:  Recent Labs  12/05/15 0422 12/06/15 0245  WBC 7.8 8.4  HGB 11.0* 10.3*  HCT 33.4* 31.1*  MCV 86.4 86.4  PLT 148* 126*   Cardiac Enzymes:  Recent Labs  12/04/15 1544 12/04/15 2240 12/05/15 0422  TROPONINI 0.05* 0.06* 0.05*   BNP: Invalid input(s): POCBNP D-Dimer: No results for input(s): DDIMER in the last 72 hours. Hemoglobin A1C: No results for input(s): HGBA1C in the last 72 hours. Fasting Lipid Panel: No results for input(s): CHOL, HDL, LDLCALC, TRIG, CHOLHDL, LDLDIRECT in the last 72 hours. Thyroid Function Tests:  Recent Labs  12/04/15 2240  TSH 7.083*   Anemia Panel: No results for input(s): VITAMINB12, FOLATE, FERRITIN, TIBC, IRON, RETICCTPCT in the last 72 hours.   PHYSICAL EXAM General: Chronically ill looking,  in no acute distress HEENT:  Normocephalic and atramatic Neck:  No JVD.  Lungs: Clear bilaterally to auscultation Heart: irregular rhythm without gallops or murmurs.    TELEMETRY:  Atrial fib, 83 bpm  ASSESSMENT AND PLAN: Atrial fib with controlled rate on cardizem, changing to oral dosing, anticoagulated with IV heparin. Has been dialized, potassium is normalized. Pt is not cooperative this morning. Continue current care and consider bridging to warfarin for stroke risk reduction if pt is reliable for therapy.  Active Problems:   Bipolar depression ([place])   A-fib ([place])    Daune Perch, NP 12/06/2015 10:39 AM

## 2015-12-06 NOTE — Progress Notes (Signed)
ID E note Patient off the floor and not seen. Report of GPC in bcx from HD center. Report of back pain  If cx is Staph aureus  will need MRI spine to evaluate for osteomyelitis given back pain in setting of bacteremia Will see in AM

## 2015-12-06 NOTE — Progress Notes (Signed)
ANTICOAGULATION CONSULT NOTE - Follow up [place] for Heparin  Indication: atrial fibrillation  Allergies  Allergen Reactions  . Penicillins Other (See Comments)    Reaction:  Unknown     Patient Measurements: Height: 5\' 9"  (175.3 cm) Weight: 173 lb 15.1 oz (78.9 kg) IBW/kg (Calculated) : 70.7 Heparin Dosing Weight: 78.9 kg  Vital Signs: Temp: 98.1 F (36.7 C) (03/15 0100) Temp Source: Axillary (03/15 0100) BP: 121/67 mmHg (03/15 0300) Pulse Rate: 82 (03/15 0300)  Labs:  Recent Labs  12/04/15 1544 12/04/15 2240 12/05/15 0422 12/05/15 1507 12/06/15 0245  HGB 11.1*  --  11.0*  --   --   HCT 33.8*  --  33.4*  --   --   PLT 154  --  148*  --   --   APTT  --  53*  --   --   --   LABPROT  --  15.2*  --   --   --   INR  --  1.18  --   --   --   HEPARINUNFRC  --   --  0.39 0.11* 0.30  CREATININE 11.79*  --  12.44* 8.39*  --   TROPONINI 0.05* 0.06* 0.05*  --   --     Estimated Creatinine Clearance: 8.9 mL/min (by C-G formula based on Cr of 8.39).   Assessment: Pharmacy consulted to dose heparin in this 65 year old male admitted with new onset Afib.  No prior anti-coag noted. CrCl = 6.3 ml/min   3/13 Hgb 11.1, Plt 154 3/14 Hgb 11.0, Plt 148  3/15 Hgb 10.3, Plt 126  RN confirms heparin drip running at 1450 units/hr (14.5 ml/hr); no line issues or s/sx of bleeding noted by RN.   Goal of Therapy:  Heparin level 0.3-0.7 units/ml Monitor platelets by anticoagulation protocol: Yes   Plan:  Heparin level resulted borderline therapeutic at 0.30. Will increase rate slightly to 1500 units/hr (=15 ml/hr). Recheck in 8 hours at 1300.  Will need to continue to monitor plt count trend; slowly trending down.   Pharmacy will continue to follow.    Rayna Sexton, PharmD, BCPS Clinical Pharmacist 12/06/2015 4:10 AM

## 2015-12-06 NOTE — Progress Notes (Signed)
ANTICOAGULATION CONSULT NOTE - Follow up [place] for Heparin  Indication: atrial fibrillation  Allergies  Allergen Reactions  . Penicillins Other (See Comments)    Reaction:  Unknown     Patient Measurements: Height: 5\' 9"  (175.3 cm) Weight: 171 lb 8.3 oz (77.8 kg) IBW/kg (Calculated) : 70.7 Heparin Dosing Weight: 78.9 kg  Vital Signs: BP: 127/64 mmHg (03/15 1312) Pulse Rate: 83 (03/15 1312)  Labs:  Recent Labs  12/04/15 1544 12/04/15 2240  12/05/15 0422 12/05/15 1507 12/06/15 0245 12/06/15 1309  HGB 11.1*  --   --  11.0*  --  10.3* 9.9*  HCT 33.8*  --   --  33.4*  --  31.1* 30.3*  PLT 154  --   --  148*  --  126* 143*  APTT  --  53*  --   --   --   --   --   LABPROT  --  15.2*  --   --   --   --   --   INR  --  1.18  --   --   --   --   --   HEPARINUNFRC  --   --   < > 0.39 0.11* 0.30 0.22*  CREATININE 11.79*  --   --  12.44* 8.39*  --  9.94*  TROPONINI 0.05* 0.06*  --  0.05*  --   --   --   < > = values in this interval not displayed.  Estimated Creatinine Clearance: 7.5 mL/min (by C-G formula based on Cr of 9.94).   Assessment: Pharmacy consulted to dose heparin in this 65 year old male admitted with new onset Afib.  No prior anti-coag noted.  Goal of Therapy:  Heparin level 0.3-0.7 units/ml Monitor platelets by anticoagulation protocol: Yes   Plan:  Heparin level is below goal so will bolus heparin 1200 units iv once then increase infusion to 1650 units/hr. Will recheck another HL in 8 hours.   Ulice Dash, PharmD Clinical Pharmacist  12/06/2015 1:52 PM

## 2015-12-06 NOTE — Progress Notes (Signed)
Pharmacy Antibiotic Note  Jonathen Callum is a 65 y.o. male with a h/o ESRD on HD admitted on 12/04/2015 with GPC  bacteremia and possible PNA.  Pharmacy has been consulted for vancomycin and meropenem dosing.  Plan: Will continue 750 mg iv q HD. Will check a pre-HD vancomycin level prior to the third dialysis session. Goal vancomycin level 15-25 mcg/ml.   Will begin meropenem 500 mg iv q 24 hours.   Height: 5\' 9"  (175.3 cm) Weight: 171 lb 8.3 oz (77.8 kg) IBW/kg (Calculated) : 70.7  Temp (24hrs), Avg:98.2 F (36.8 C), Min:98.1 F (36.7 C), Max:98.4 F (36.9 C)   Recent Labs Lab 12/04/15 1544 12/05/15 0422 12/05/15 1507 12/06/15 0245  WBC 8.5 7.8  --  8.4  CREATININE 11.79* 12.44* 8.39*  --     Estimated Creatinine Clearance: 8.9 mL/min (by C-G formula based on Cr of 8.39).    Allergies  Allergen Reactions  . Penicillins Other (See Comments)    Reaction:  Unknown     Antimicrobials this admission: Vancomycin  3/14 >>  Meropenem 3/15 >>   Dose adjustments this admission:   Microbiology results: 3/14 BCx: NGTD x 2 Blood cx from [place] dialysis: GPC in clusters in 2/2 per MD  3/13 MRSA PCR: negative  Thank you for allowing pharmacy to be a part of this patient's care.  Ulice Dash D 12/06/2015 11:45 AM

## 2015-12-06 NOTE — H&P (Addendum)
[facility] [place] NAME: Noah Lewis    MR#:  [id number]  DATE OF BIRTH:  [birth year]  SUBJECTIVE:  CHIEF COMPLAINT:   Chief Complaint  Patient presents with  . Back Pain  Drowzy. Has been awake on and off. Placed on NRB overnight due to hypoxia.  REVIEW OF SYSTEMS:   ROS unobtainable due to drowziness  DRUG ALLERGIES:   Allergies  Allergen Reactions  . Penicillins Other (See Comments)    Reaction:  Unknown     VITALS:  Blood pressure 140/88, pulse 84, temperature 98.1 F (36.7 C), temperature source Axillary, resp. rate 17, height 5\' 9"  (1.753 m), weight 77.8 kg (171 lb 8.3 oz), SpO2 100 %.  PHYSICAL EXAMINATION:  GENERAL:  65 y.o.-year-old patient lying in the bed , drowsy EYES: Pupils equal, round, reactive to light and accommodation. No scleral icterus. Extraocular muscles intact.  HEENT: Head atraumatic, normocephalic. Oropharynx and nasopharynx clear.  NECK:  Supple, no jugular venous distention. No thyroid enlargement, no tenderness.  LUNGS: Bilateral coarse breath sounds. CARDIOVASCULAR: S1, S2 normal. No murmurs, rubs, or gallops.  ABDOMEN: Soft, nontender, nondistended. Bowel sounds present. No organomegaly or mass.  EXTREMITIES: No pedal edema, cyanosis, or clubbing.  NEUROLOGIC: Patient is able to squeeze my fingers. Moves his feet minimally. Does not follow all commands. PSYCHIATRIC: The patient is drowsy SKIN: No obvious rash, lesion, or ulcer.    LABORATORY PANEL:   CBC  Recent Labs Lab 12/06/15 0245  WBC 8.4  HGB 10.3*  HCT 31.1*  PLT 126*   ------------------------------------------------------------------------------------------------------------------  Chemistries   Recent Labs Lab 12/04/15 1544  12/05/15 1507  NA 133*  < > 133*  K 6.1*  < > 3.8  CL 97*  < > 95*  CO2 19*  < > 26  GLUCOSE 96  < > 107*  BUN 88*  < > 63*  CREATININE 11.79*  < > 8.39*  CALCIUM 8.5*  < > 8.1*   AST 33  --   --   ALT 24  --   --   ALKPHOS 84  --   --   BILITOT 0.8  --   --   < > = values in this interval not displayed. ------------------------------------------------------------------------------------------------------------------  Cardiac Enzymes  Recent Labs Lab 12/05/15 0422  TROPONINI 0.05*   ------------------------------------------------------------------------------------------------------------------  RADIOLOGY:  Dg Chest 2 View  12/04/2015  CLINICAL DATA:  Shortness of breath.  Chronic renal failure EXAM: CHEST  2 VIEW COMPARISON:  September 20, 2015 FINDINGS: Dialysis catheter placed from a femoral location has its tip in the right atrium. No pneumothorax. There is no edema or consolidation. Heart is mildly enlarged with pulmonary vascularity within normal limits. No adenopathy. There is a left subclavian stent. There is degenerative change in the thoracic spine. IMPRESSION: No edema or consolidation. Cardiomegaly is stable. Central catheter tip is in the right atrium. No pneumothorax. Electronically Signed   By: Lowella Grip III M.D.   On: 12/04/2015 14:55   Dg Lumbar Spine 2-3 Views  12/04/2015  CLINICAL DATA:  Low back pain for approximately 2 days. No known injury. Initial encounter. EXAM: LUMBAR SPINE - 2-3 VIEW COMPARISON:  CT abdomen and pelvis 09/20/2015. FINDINGS: Vertebral body height and alignment are maintained. Anterior endplate spurring lower thoracic spine is noted. Intervertebral disc space height is normal. Dialysis catheter from a right groin approach is noted. IMPRESSION: No acute abnormality.  Mild appearing lower thoracic spondylosis. Electronically Signed  By: Inge Rise M.D.   On: 12/04/2015 14:54   Dg Chest Port 1 View  12/06/2015  CLINICAL DATA:  Hypoxia, history hypertension, diabetes mellitus, asthma, COPD, end-stage renal disease on dialysis, coronary artery disease EXAM: PORTABLE CHEST 1 VIEW COMPARISON:  Portable exam 0957 hours  compared to 12/04/2015 FINDINGS: Dialysis catheter via infra diaphragmatic approach with tip projecting over RIGHT atrium. Wall stent identified at the LEFT brachiocephalic vein. Enlargement of cardiac silhouette with pulmonary vascular congestion. Mediastinal contours normal. Question mild atelectasis or infiltrate in retrocardiac LEFT lower lobe. Remaining lungs clear. No pleural effusion or pneumothorax. IMPRESSION: Enlargement of cardiac silhouette with pulmonary vascular congestion. Question mild atelectasis or infiltrate in retrocardiac LEFT lower lobe. Electronically Signed   By: Lavonia Dana M.D.   On: 12/06/2015 10:35    EKG:   Orders placed or performed during the hospital encounter of 12/04/15  . ED EKG  . ED EKG    ASSESSMENT AND PLAN:   * Acute on chronic diastolic CHF with acute hypoxic respiratory failure Patient is being scheduled for dialysis later today. We'll need to reassess after this. Check a stat ABG and chest x-ray. Chest x-ray showed pulmonary edema.  * New onset Atrial fibrillation Appreciate cardiology input. On oral Cardizem. Heparin drip. Will add Coumadin orally  * Group positive cocci bacteremia Blood cultures drawn at the dialysis center on 12/02/2015. But cultures repeated in the hospital on 12/05/2015. On vancomycin. We will request ID consult.  * Hyperkalemia with end-stage renal disease Improved with Kayexalate, IV insulin and D50. Continue HD. Hold Vasotec  * Hypertension. Continue labetalol and cardizem. Hold Vasotec.  * Severe depression Appreciate Dr. Weber Cooks the patient  * chronic low back pain.  pain control. DC morphine  * Diabetes mellitus Sliding scale.  * DVT prophylaxis. Patient is on heparin drip.  All the records are reviewed and case discussed with Care Management/Social Workerr. Management plans discussed with the patient, family and they are in agreement.  CODE STATUS: full code.  TOTAL CRITICAL CARE TIME TAKING  CARE OF THIS PATIENT: 35 minutes.   POSSIBLE D/C IN 2-3 DAYS, DEPENDING ON CLINICAL CONDITION.   Hillary Bow R M.D on 12/06/2015 at 12:36 PM  Between 7am to 6pm - Pager - [phone]  After 6pm go to [email or web address] - password [email or web address] [facility]  Office  [phone]  CC: Primary care physician; Elyse Jarvis, MD

## 2015-12-06 NOTE — Progress Notes (Signed)
Subjective:  Patient is a chronic hemodialysis patient who is known to our practice from outpatient He dialyzes at the [facility] dialysis center on [address]  According to outpatient notes, last dialysis treatment was on Saturday He was originally admitted for back pain but found to have hyperkalemia- treated with medical measures and dialysis 2000 cc removed with HD on Tuesday   Objective:  Vital signs in last 24 hours:  Temp:  [98.1 F (36.7 C)-98.4 F (36.9 C)] 98.1 F (36.7 C) (03/15 0100) Pulse Rate:  [37-129] 84 (03/15 0700) Resp:  [17-29] 17 (03/15 0700) BP: (102-172)/(64-126) 140/88 mmHg (03/15 0700) SpO2:  [84 %-100 %] 100 % (03/15 0700) Weight:  [77.8 kg (171 lb 8.3 oz)-78.9 kg (173 lb 15.1 oz)] 77.8 kg (171 lb 8.3 oz) (03/15 0500)  Weight change: -0.026 kg (-0.9 oz) Filed Weights   12/04/15 1354 12/05/15 1350 12/06/15 0500  Weight: 78.926 kg (174 lb) 78.9 kg (173 lb 15.1 oz) 77.8 kg (171 lb 8.3 oz)    Intake/Output:    Intake/Output Summary (Last 24 hours) at 12/06/15 1008 Last data filed at 12/06/15 0700  Gross per 24 hour  Intake 1170.77 ml  Output   2000 ml  Net -829.23 ml     Physical Exam: General: Chronically ill-appearing, laying in the bed   HEENT Moist oral mucous membranes   Neck Supple,   Pulm/lungs Normal respiratory effort,  Coarse b/l  CVS/Heart Irregular rhythm   Abdomen:  Soft, nontender   Extremities:  + Dependent edema   Neurologic: Resting quietly   Skin: No acute rashes   Access: Left arm AV fistula        Basic Metabolic Panel:   Recent Labs Lab 12/04/15 1544 12/04/15 2240 12/05/15 0422 12/05/15 1507  NA 133*  --  132* 133*  K 6.1* 5.0 4.7 3.8  CL 97*  --  98* 95*  CO2 19*  --  16* 26  GLUCOSE 96  --  126* 107*  BUN 88*  --  97* 63*  CREATININE 11.79*  --  12.44* 8.39*  CALCIUM 8.5*  --  8.0* 8.1*  PHOS  --   --   --  3.6     CBC:  Recent Labs Lab 12/04/15 1544 12/05/15 0422 12/06/15 0245  WBC  8.5 7.8 8.4  HGB 11.1* 11.0* 10.3*  HCT 33.8* 33.4* 31.1*  MCV 87.0 86.4 86.4  PLT 154 148* 126*      Microbiology:  Recent Results (from the past 720 hour(s))  MRSA PCR Screening     Status: None   Collection Time: 12/04/15 10:40 PM  Result Value Ref Range Status   MRSA by PCR NEGATIVE NEGATIVE Final    Comment:        The GeneXpert MRSA Assay (FDA approved for NASAL specimens only), is one component of a comprehensive MRSA colonization surveillance program. It is not intended to diagnose MRSA infection nor to guide or monitor treatment for MRSA infections.   CULTURE, BLOOD (ROUTINE X 2) w Reflex to PCR ID Panel     Status: None (Preliminary result)   Collection Time: 12/05/15 10:00 PM  Result Value Ref Range Status   Specimen Description BLOOD RIGHT ANTECUBITAL  Final   Special Requests BOTTLES DRAWN AEROBIC AND ANAEROBIC 5ML  Final   Culture NO GROWTH < 12 HOURS  Final   Report Status PENDING  Incomplete  CULTURE, BLOOD (ROUTINE X 2) w Reflex to PCR ID Panel  Status: None (Preliminary result)   Collection Time: 12/05/15 10:05 PM  Result Value Ref Range Status   Specimen Description BLOOD BLOOD RIGHT FOREARM  Final   Special Requests BOTTLES DRAWN AEROBIC AND ANAEROBIC 5ML  Final   Culture NO GROWTH < 12 HOURS  Final   Report Status PENDING  Incomplete    Coagulation Studies:  Recent Labs  12/04/15 2240  LABPROT 15.2*  INR 1.18    Urinalysis: No results for input(s): COLORURINE, LABSPEC, PHURINE, GLUCOSEU, HGBUR, BILIRUBINUR, KETONESUR, PROTEINUR, UROBILINOGEN, NITRITE, LEUKOCYTESUR in the last 72 hours.  Invalid input(s): APPERANCEUR    Imaging: Dg Chest 2 View  12/04/2015  CLINICAL DATA:  Shortness of breath.  Chronic renal failure EXAM: CHEST  2 VIEW COMPARISON:  September 20, 2015 FINDINGS: Dialysis catheter placed from a femoral location has its tip in the right atrium. No pneumothorax. There is no edema or consolidation. Heart is mildly enlarged  with pulmonary vascularity within normal limits. No adenopathy. There is a left subclavian stent. There is degenerative change in the thoracic spine. IMPRESSION: No edema or consolidation. Cardiomegaly is stable. Central catheter tip is in the right atrium. No pneumothorax. Electronically Signed   By: Lowella Grip III M.D.   On: 12/04/2015 14:55   Dg Lumbar Spine 2-3 Views  12/04/2015  CLINICAL DATA:  Low back pain for approximately 2 days. No known injury. Initial encounter. EXAM: LUMBAR SPINE - 2-3 VIEW COMPARISON:  CT abdomen and pelvis 09/20/2015. FINDINGS: Vertebral body height and alignment are maintained. Anterior endplate spurring lower thoracic spine is noted. Intervertebral disc space height is normal. Dialysis catheter from a right groin approach is noted. IMPRESSION: No acute abnormality.  Mild appearing lower thoracic spondylosis. Electronically Signed   By: Inge Rise M.D.   On: 12/04/2015 14:54     Medications:   . diltiazem (CARDIZEM) infusion 7.5 mg/hr (12/06/15 0700)  . heparin 1,500 Units/hr (12/06/15 0827)   . antiseptic oral rinse  7 mL Mouth Rinse q12n4p  . aspirin  81 mg Oral Daily  . chlorhexidine  15 mL Mouth Rinse BID  . docusate sodium  100 mg Oral BID  . insulin aspart  0-5 Units Subcutaneous QHS  . insulin aspart  0-9 Units Subcutaneous TID WC  . labetalol  200 mg Oral TID  . latanoprost  1 drop Both Eyes QHS  . QUEtiapine  50 mg Oral QHS  . sevelamer carbonate  2,400 mg Oral TID WC  . sodium chloride flush  3 mL Intravenous Q12H  . sodium chloride flush  3 mL Intravenous Q12H  . vancomycin  750 mg Intravenous Q M,W,F-HD   sodium chloride, acetaminophen **OR** acetaminophen, HYDROcodone-acetaminophen, ipratropium-albuterol, labetalol, ondansetron **OR** ondansetron (ZOFRAN) IV, polyethylene glycol, sodium chloride flush  Assessment/ Plan:  65 y.o. male with end-stage renal disease, diabetes, hypertension [employer]/M-W-F/ [facility]  1.  End-stage renal disease with hyperkalemia - routine HD today   2. Atrial fibrillation - Currently treated with anticoagulation, Cardizem drip  3. Anemia of chronic kidney disease - Hemoglobin 10.3 - low dose procrit with HD  4. SHPTH - monitor phos level     LOS: 2 Cena Bruhn 3/15/201710:08 AM

## 2015-12-06 NOTE — Consult Note (Signed)
  Psychiatry: Once again attempted to check in with this patient with a history of mood disorder and substance abuse currently with concerns about his potentially refusing treatment. Patient was in dialysis this afternoon. I went to dialysis and attempted to speak with him but once again he either could not or would not open his eyes or speak to me. He did appear sicker today and is on a face mask of oxygen while getting dialysis.  Spoke with pale active care physician today. For some reason she was able to access notes in the chart that I am not that show some more details of his past psychiatric history, particularly emphasizing long-standing problems with substance abuse and refusal of care and lack of follow-through and cooperation. There was nothing that I saw that indicated a clear history of any manic episodes.  Given this as well as the fact that he appeared to be over sedated this morning from his Seroquel, which she did take, I will discontinue that. Seems more important to minimize sedating medication just to see if we can communicate with him. I will continue to try to follow-up daily.

## 2015-12-07 LAB — BLOOD CULTURE ID PANEL (REFLEXED)
ACINETOBACTER BAUMANNII: NOT DETECTED
CANDIDA GLABRATA: NOT DETECTED
CANDIDA TROPICALIS: NOT DETECTED
CARBAPENEM RESISTANCE: NOT DETECTED
Candida albicans: NOT DETECTED
Candida krusei: NOT DETECTED
Candida parapsilosis: NOT DETECTED
ENTEROCOCCUS SPECIES: NOT DETECTED
ESCHERICHIA COLI: NOT DETECTED
Enterobacter cloacae complex: NOT DETECTED
Enterobacteriaceae species: NOT DETECTED
HAEMOPHILUS INFLUENZAE: NOT DETECTED
Klebsiella oxytoca: NOT DETECTED
Klebsiella pneumoniae: NOT DETECTED
LISTERIA MONOCYTOGENES: NOT DETECTED
METHICILLIN RESISTANCE: NOT DETECTED
NEISSERIA MENINGITIDIS: NOT DETECTED
PROTEUS SPECIES: NOT DETECTED
Pseudomonas aeruginosa: NOT DETECTED
SERRATIA MARCESCENS: NOT DETECTED
STAPHYLOCOCCUS SPECIES: DETECTED — AB
STREPTOCOCCUS PYOGENES: NOT DETECTED
STREPTOCOCCUS SPECIES: NOT DETECTED
Staphylococcus aureus (BCID): DETECTED — AB
Streptococcus agalactiae: NOT DETECTED
Streptococcus pneumoniae: NOT DETECTED
Vancomycin resistance: NOT DETECTED

## 2015-12-07 LAB — HEPARIN LEVEL (UNFRACTIONATED)
HEPARIN UNFRACTIONATED: 2 [IU]/mL — AB (ref 0.30–0.70)
HEPARIN UNFRACTIONATED: 0.23 [IU]/mL — AB (ref 0.30–0.70)
HEPARIN UNFRACTIONATED: 0.12 [IU]/mL — AB (ref 0.30–0.70)

## 2015-12-07 LAB — GLUCOSE, CAPILLARY
GLUCOSE-CAPILLARY: 106 mg/dL — AB (ref 65–99)
GLUCOSE-CAPILLARY: 89 mg/dL (ref 65–99)
GLUCOSE-CAPILLARY: 102 mg/dL — AB (ref 65–99)
Glucose-Capillary: 96 mg/dL (ref 65–99)
Glucose-Capillary: 97 mg/dL (ref 65–99)

## 2015-12-07 LAB — HEPATITIS B SURFACE ANTIGEN: Hepatitis B Surface Ag: NEGATIVE

## 2015-12-07 LAB — PROTIME-INR
INR: 1.22
Prothrombin Time: 15.6 seconds — ABNORMAL HIGH (ref 11.4–15.0)

## 2015-12-07 LAB — CBC
HEMATOCRIT: 30.3 % — AB (ref 40.0–52.0)
HEMOGLOBIN: 10 g/dL — AB (ref 13.0–18.0)
MCH: 27.9 pg (ref 26.0–34.0)
MCHC: 32.9 g/dL (ref 32.0–36.0)
MCV: 84.8 fL (ref 80.0–100.0)
Platelets: 145 10*3/uL — ABNORMAL LOW (ref 150–440)
RBC: 3.58 MIL/uL — ABNORMAL LOW (ref 4.40–5.90)
RDW: 17.1 % — AB (ref 11.5–14.5)
WBC: 12.9 10*3/uL — ABNORMAL HIGH (ref 3.8–10.6)

## 2015-12-07 MED ORDER — NEPRO/CARBSTEADY PO LIQD
237.0000 mL | ORAL | Status: DC
  Administered 2015-12-09: 237 mL via ORAL

## 2015-12-07 MED ORDER — HEPARIN (PORCINE) IN NACL 100-0.45 UNIT/ML-% IJ SOLN
1950.0000 [IU]/h | INTRAMUSCULAR | Status: DC
  Administered 2015-12-07: 1700 [IU]/h via INTRAVENOUS
  Administered 2015-12-07: 1950 [IU]/h via INTRAVENOUS
  Filled 2015-12-07 (×3): qty 250

## 2015-12-07 MED ORDER — HEPARIN BOLUS VIA INFUSION
2300.0000 [IU] | Freq: Once | INTRAVENOUS | Status: AC
  Administered 2015-12-07: 2300 [IU] via INTRAVENOUS
  Filled 2015-12-07: qty 2300

## 2015-12-07 NOTE — Consult Note (Signed)
Pharmacy Antibiotic Note  Noah Lewis is a 65 y.o. male admitted on 12/04/2015 with bacteremia.   Lab called with [facility] results of Staph Aureus in bottle, no methicillin resistance detected. Called Dr. Darvin Neighbours about results. Patient is currently on vancomycin. Patient has a Otho allergy with unknown reaction  Plan: Recommended cefazolin if we could verify that the allergy was not severe. Per [name], he will follow up with [name] since he is on the case.  Height: 5\' 9"  (175.3 cm) Weight: 170 lb 15.1 oz (77.54 kg) IBW/kg (Calculated) : 70.7  Temp (24hrs), Avg:98.2 F (36.8 C), Min:97.6 F (36.4 C), Max:98.6 F (37 C)   Recent Labs Lab 12/04/15 1544 12/05/15 0422 12/05/15 1507 12/06/15 0245 12/06/15 1309 12/07/15 0831  WBC 8.5 7.8  --  8.4 11.0* 12.9*  CREATININE 11.79* 12.44* 8.39*  --  9.94*  --     Estimated Creatinine Clearance: 7.5 mL/min (by C-G formula based on Cr of 9.94).    Allergies  Allergen Reactions  . Penicillins Other (See Comments)    Reaction:  Unknown     Antimicrobials this admission: Vancomycin 3/14 >>  Meropenem 3/15 >> 3/16  Dose adjustments this admission:   Microbiology results: 3/14 BCx: staph aureus  Blood cx from [place] dialysis: GPC in clusters in 2/2 per MD  3/13 MRSA PCR: negative  Thank you for allowing pharmacy to be a part of this patient's care.  Ramond Dial 12/07/2015 4:06 PM

## 2015-12-07 NOTE — Progress Notes (Signed)
Heparin Drip on hold pending double-check of Heparin level.  Heparin level this am 3/16 at 0830= 2.00.   Chinita Greenland PharmD Clinical Pharmacist 12/07/2015

## 2015-12-07 NOTE — Progress Notes (Signed)
ANTICOAGULATION CONSULT NOTE - Follow up [place] for Heparin  Indication: atrial fibrillation  Allergies  Allergen Reactions  . Penicillins Other (See Comments)    Reaction:  Unknown     Patient Measurements: Height: 5\' 9"  (175.3 cm) Weight: 170 lb 15.1 oz (77.54 kg) IBW/kg (Calculated) : 70.7 Heparin Dosing Weight: 77.8 kg  Vital Signs: Temp: 98.6 F (37 C) (03/16 1100) Temp Source: Oral (03/16 1100) BP: 111/72 mmHg (03/16 1100) Pulse Rate: 79 (03/16 1100)  Labs:  Recent Labs  12/04/15 1544 12/04/15 2240  12/05/15 0422 12/05/15 1507 12/06/15 0245 12/06/15 1309 12/06/15 2216 12/07/15 0831 12/07/15 1013  HGB 11.1*  --   --  11.0*  --  10.3* 9.9*  --  10.0*  --   HCT 33.8*  --   --  33.4*  --  31.1* 30.3*  --  30.3*  --   PLT 154  --   --  148*  --  126* 143*  --  145*  --   APTT  --  53*  --   --   --   --   --   --   --   --   LABPROT  --  15.2*  --   --   --   --  15.5*  --   --  15.6*  INR  --  1.18  --   --   --   --  1.21  --   --  1.22  HEPARINUNFRC  --   --   < > 0.39 0.11* 0.30 0.22* 0.29* 2.00* 0.23*  CREATININE 11.79*  --   --  12.44* 8.39*  --  9.94*  --   --   --   TROPONINI 0.05* 0.06*  --  0.05*  --   --   --   --   --   --   < > = values in this interval not displayed.  Estimated Creatinine Clearance: 7.5 mL/min (by C-G formula based on Cr of 9.94).   Assessment: Pharmacy consulted to dose heparin in this 65 year old male admitted with new onset Afib.  No prior anti-coag noted.  Goal of Therapy:  Heparin level 0.3-0.7 units/ml Monitor platelets by anticoagulation protocol: Yes   Plan:  Heparin level of 0.29 is below goal so will bolus heparin 1150 units (~15 units/kg) once then increase infusion to 1750 units/hr. Will recheck another HL in 8 hours. CBC ordered with AM labs tomorrow.  3/16: Heparin level at 0831= 2.00? Had RN hold Heparin drip and re-checked level.  Heparin level recheck at 1013= 0.23. Suspect previous level  was an error. Heparin drip restarted at 1700 units/hr. Will recheck level in 6 hrs (a little early due to above). Patient now on Warfarin also.  Chinita Greenland PharmD Clinical Pharmacist 12/07/2015 12:24 PM

## 2015-12-07 NOTE — Progress Notes (Signed)
Pharmacy Antibiotic Note  Gregroy Lahaie is a 65 y.o. male with a h/o ESRD on HD admitted on 12/04/2015 with GPC  bacteremia and possible PNA.  Pharmacy has been consulted for vancomycin and meropenem dosing. Meropenem d/c 3/16  Plan: Will continue Vancomycin 750 mg iv q HD. Will check a pre-HD vancomycin level prior to the third dialysis session. Goal vancomycin level 15-25 mcg/ml.    ID consulted: note 3/16: Staph aureus bacteremia (CX at HD with sensis pending). FU bcx negative. TTE shows no vegetation.   Height: 5\' 9"  (175.3 cm) Weight: 170 lb 15.1 oz (77.54 kg) IBW/kg (Calculated) : 70.7  Temp (24hrs), Avg:98.1 F (36.7 C), Min:97.6 F (36.4 C), Max:98.6 F (37 C)   Recent Labs Lab 12/04/15 1544 12/05/15 0422 12/05/15 1507 12/06/15 0245 12/06/15 1309 12/07/15 0831  WBC 8.5 7.8  --  8.4 11.0* 12.9*  CREATININE 11.79* 12.44* 8.39*  --  9.94*  --     Estimated Creatinine Clearance: 7.5 mL/min (by C-G formula based on Cr of 9.94).    Allergies  Allergen Reactions  . Penicillins Other (See Comments)    Reaction:  Unknown     Antimicrobials this admission: Vancomycin  3/14 >>  Meropenem 3/15 >> 3/16   Dose adjustments this admission:   Microbiology results: 3/14 BCx: NGTD x 2 Blood cx from [facility] dialysis: GPC in clusters in 2/2 per MD  3/13 MRSA PCR: negative  Thank you for allowing pharmacy to be a part of this patient's care.  Chantea Surace A 12/07/2015 12:30 PM

## 2015-12-07 NOTE — H&P (Signed)
[facility] [place] NAME: Noah Lewis    MR#:  [id number]  DATE OF BIRTH:  10-19-1950  SUBJECTIVE:  CHIEF COMPLAINT:   Chief Complaint  Patient presents with  . Back Pain  Drowzy. Has been awake on and off. Decreased hearing Follow some commands  REVIEW OF SYSTEMS:   ROS unobtainable due to drowziness  DRUG ALLERGIES:   Allergies  Allergen Reactions  . Penicillins Other (See Comments)    Reaction:  Unknown     VITALS:  Blood pressure 111/72, pulse 79, temperature 98.6 F (37 C), temperature source Oral, resp. rate 20, height 5\' 9"  (1.753 m), weight 77.54 kg (170 lb 15.1 oz), SpO2 90 %.  PHYSICAL EXAMINATION:  GENERAL:  65 y.o.-year-old patient lying in the bed , drowsy EYES: Pupils equal, round, reactive to light and accommodation. No scleral icterus. Extraocular muscles intact.  HEENT: Head atraumatic, normocephalic. Oropharynx and nasopharynx clear.  NECK:  Supple, no jugular venous distention. No thyroid enlargement, no tenderness.  LUNGS: Bilateral coarse breath sounds. CARDIOVASCULAR: S1, S2 normal. No murmurs, rubs, or gallops.  ABDOMEN: Soft, nontender, nondistended. Bowel sounds present. No organomegaly or mass.  EXTREMITIES: No pedal edema, cyanosis, or clubbing.  NEUROLOGIC: Patient is able to squeeze my fingers. Moves his feet minimally. Does not follow all commands. PSYCHIATRIC: The patient is drowsy SKIN: No obvious rash, lesion, or ulcer.    LABORATORY PANEL:   CBC  Recent Labs Lab 12/07/15 0831  WBC 12.9*  HGB 10.0*  HCT 30.3*  PLT 145*   ------------------------------------------------------------------------------------------------------------------  Chemistries   Recent Labs Lab 12/04/15 1544  12/06/15 1309  NA 133*  < > 131*  K 6.1*  < > 4.7  CL 97*  < > 93*  CO2 19*  < > 22  GLUCOSE 96  < > 112*  BUN 88*  < > 69*  CREATININE 11.79*  < > 9.94*  CALCIUM 8.5*  < > 7.4*   AST 33  --   --   ALT 24  --   --   ALKPHOS 84  --   --   BILITOT 0.8  --   --   < > = values in this interval not displayed. ------------------------------------------------------------------------------------------------------------------  Cardiac Enzymes  Recent Labs Lab 12/05/15 0422  TROPONINI 0.05*   ------------------------------------------------------------------------------------------------------------------  RADIOLOGY:  Dg Chest Port 1 View  12/06/2015  CLINICAL DATA:  Hypoxia, history hypertension, diabetes mellitus, asthma, COPD, end-stage renal disease on dialysis, coronary artery disease EXAM: PORTABLE CHEST 1 VIEW COMPARISON:  Portable exam 0957 hours compared to 12/04/2015 FINDINGS: Dialysis catheter via infra diaphragmatic approach with tip projecting over RIGHT atrium. Wall stent identified at the LEFT brachiocephalic vein. Enlargement of cardiac silhouette with pulmonary vascular congestion. Mediastinal contours normal. Question mild atelectasis or infiltrate in retrocardiac LEFT lower lobe. Remaining lungs clear. No pleural effusion or pneumothorax. IMPRESSION: Enlargement of cardiac silhouette with pulmonary vascular congestion. Question mild atelectasis or infiltrate in retrocardiac LEFT lower lobe. Electronically Signed   By: Lavonia Dana M.D.   On: 12/06/2015 10:35    EKG:   Orders placed or performed during the hospital encounter of 12/04/15  . ED EKG  . ED EKG    ASSESSMENT AND PLAN:   * Acute on chronic diastolic CHF with acute hypoxic respiratory failure Chest x-ray showed pulmonary edema. Presently on nonrebreather.  * New onset Atrial fibrillation Appreciate cardiology input. On oral Cardizem. Heparin drip. Added Coumadin.  * Staph aureus  bacteremia - afebrile Blood cultures drawn at the dialysis center on 12/02/2015. But cultures repeated in the hospital on 12/05/2015 NGTD On vancomycin. Appreciate ID input. MRI of the lumbar spine  pending  * Hyperkalemia with end-stage renal disease. Improved with Kayexalate, IV insulin and D50. Continue HD. Hold Vasotec.  * Hypertension. Continue labetalol and cardizem. Hold Vasotec.  * Severe depression Appreciate Dr. Weber Cooks seeing the patient.  * chronic low back pain.  pain control. Discontinued morphine  * Diabetes mellitus Sliding scale.  * DVT prophylaxis. Patient is on heparin drip.  All the records are reviewed and case discussed with Care Management/Social Workerr. Management plans discussed with the patient, family and they are in agreement.  CODE STATUS: full code.  TOTAL TIME TAKING CARE OF THIS PATIENT: 35 minutes.   POSSIBLE D/C IN 3-4 DAYS, DEPENDING ON CLINICAL CONDITION.  Hillary Bow R M.D on 12/07/2015 at 2:36 PM  Between 7am to 6pm - Pager - [phone]  After 6pm go to [email or web address] - password [email or web address] [facility]  Office  [phone]  CC: Primary care physician; Elyse Jarvis, MD

## 2015-12-07 NOTE — Progress Notes (Signed)
ANTICOAGULATION CONSULT NOTE - Follow up [facility] for Heparin  Indication: atrial fibrillation  Allergies  Allergen Reactions  . Penicillins Other (See Comments)    Reaction:  Unknown     Patient Measurements: Height: 5\' 9"  (175.3 cm) Weight: 170 lb 15.1 oz (77.54 kg) IBW/kg (Calculated) : 70.7 Heparin Dosing Weight: 77.8 kg  Vital Signs: Temp: 98.2 F (36.8 C) (03/16 1953) Temp Source: Oral (03/16 1100) BP: 130/67 mmHg (03/16 1953) Pulse Rate: 66 (03/16 1953)  Labs:  Recent Labs  12/04/15 2240  12/05/15 0422 12/05/15 1507 12/06/15 0245 12/06/15 1309  12/07/15 0831 12/07/15 1013 12/07/15 1744  HGB  --   < > 11.0*  --  10.3* 9.9*  --  10.0*  --   --   HCT  --   < > 33.4*  --  31.1* 30.3*  --  30.3*  --   --   PLT  --   < > 148*  --  126* 143*  --  145*  --   --   APTT 53*  --   --   --   --   --   --   --   --   --   LABPROT 15.2*  --   --   --   --  15.5*  --   --  15.6*  --   INR 1.18  --   --   --   --  1.21  --   --  1.22  --   HEPARINUNFRC  --   < > 0.39 0.11* 0.30 0.22*  < > 2.00* 0.23* 0.12*  CREATININE  --   --  12.44* 8.39*  --  9.94*  --   --   --   --   TROPONINI 0.06*  --  0.05*  --   --   --   --   --   --   --   < > = values in this interval not displayed.  Estimated Creatinine Clearance: 7.5 mL/min (by C-G formula based on Cr of 9.94).   Assessment: Pharmacy consulted to dose heparin in this 65 year old male admitted with new onset Afib.  No prior anti-coag noted.  Goal of Therapy:  Heparin level 0.3-0.7 units/ml Monitor platelets by anticoagulation protocol: Yes   Plan:  Heparin level of 0.29 is below goal so will bolus heparin 1150 units (~15 units/kg) once then increase infusion to 1750 units/hr. Will recheck another HL in 8 hours. CBC ordered with AM labs tomorrow.  3/16: Heparin level at 0831= 2.00? Had RN hold Heparin drip and re-checked level.  Heparin level recheck at 1013= 0.23. Suspect previous level was an error.  Heparin drip restarted at 1700 units/hr. Will recheck level in 6 hrs (a little early due to above). Patient now on Warfarin also.  Heparin level =0.12 @ 1744. Probably low due to the pervious hold. Will bolus 2300 units, will increase rate to 1950 units/hr. Recheck in 6 hours.  Ramond Dial, Pharm.D Clinical Pharmacist   12/07/2015 8:03 PM

## 2015-12-07 NOTE — Progress Notes (Signed)
Subjective:  Patient is a chronic hemodialysis patient who is known to our practice from outpatient He dialyzes at the [facility] dialysis center on [address] He was originally admitted for back pain but found to have hyperkalemia- treated with medical measures and dialysis Patient did not offer any complaints Sleepy. Did not wake up   Objective:  Vital signs in last 24 hours:  Temp:  [97.6 F (36.4 C)-98.6 F (37 C)] 98.6 F (37 C) (03/16 1100) Pulse Rate:  [62-90] 79 (03/16 1100) Resp:  [13-20] 20 (03/16 1100) BP: (91-131)/(58-91) 111/72 mmHg (03/16 1100) SpO2:  [90 %-100 %] 90 % (03/16 1100) Weight:  [77.338 kg (170 lb 8 oz)-77.8 kg (171 lb 8.3 oz)] 77.54 kg (170 lb 15.1 oz) (03/16 0505)  Weight change: -1.1 kg (-2 lb 6.8 oz) Filed Weights   12/06/15 1512 12/06/15 1902 12/07/15 0505  Weight: 77.8 kg (171 lb 8.3 oz) 77.338 kg (170 lb 8 oz) 77.54 kg (170 lb 15.1 oz)    Intake/Output:    Intake/Output Summary (Last 24 hours) at 12/07/15 1215 Last data filed at 12/07/15 0741  Gross per 24 hour  Intake 204.05 ml  Output      0 ml  Net 204.05 ml     Physical Exam: General: Chronically ill-appearing, laying in the bed   HEENT Moist oral mucous membranes   Neck Supple,   Pulm/lungs Normal respiratory effort,  Coarse b/l  CVS/Heart Irregular rhythm   Abdomen:  Soft, nontender   Extremities:  + Dependent edema   Neurologic: Resting quietly   Skin: No acute rashes   Access: Left arm AV fistula . Left arm swelling noted        Basic Metabolic Panel:   Recent Labs Lab 12/04/15 1544 12/04/15 2240 12/05/15 0422 12/05/15 1507 12/06/15 1309  NA 133*  --  132* 133* 131*  K 6.1* 5.0 4.7 3.8 4.7  CL 97*  --  98* 95* 93*  CO2 19*  --  16* 26 22  GLUCOSE 96  --  126* 107* 112*  BUN 88*  --  97* 63* 69*  CREATININE 11.79*  --  12.44* 8.39* 9.94*  CALCIUM 8.5*  --  8.0* 8.1* 7.4*  PHOS  --   --   --  3.6  --      CBC:  Recent Labs Lab 12/04/15 1544  12/05/15 0422 12/06/15 0245 12/06/15 1309 12/07/15 0831  WBC 8.5 7.8 8.4 11.0* 12.9*  HGB 11.1* 11.0* 10.3* 9.9* 10.0*  HCT 33.8* 33.4* 31.1* 30.3* 30.3*  MCV 87.0 86.4 86.4 86.2 84.8  PLT 154 148* 126* 143* 145*      Microbiology:  Recent Results (from the past 720 hour(s))  MRSA PCR Screening     Status: None   Collection Time: 12/04/15 10:40 PM  Result Value Ref Range Status   MRSA by PCR NEGATIVE NEGATIVE Final    Comment:        The GeneXpert MRSA Assay (FDA approved for NASAL specimens only), is one component of a comprehensive MRSA colonization surveillance program. It is not intended to diagnose MRSA infection nor to guide or monitor treatment for MRSA infections.   CULTURE, BLOOD (ROUTINE X 2) w Reflex to PCR ID Panel     Status: None (Preliminary result)   Collection Time: 12/05/15 10:00 PM  Result Value Ref Range Status   Specimen Description BLOOD RIGHT ANTECUBITAL  Final   Special Requests BOTTLES DRAWN AEROBIC AND ANAEROBIC 5ML  Final  Culture NO GROWTH 2 DAYS  Final   Report Status PENDING  Incomplete  CULTURE, BLOOD (ROUTINE X 2) w Reflex to PCR ID Panel     Status: None (Preliminary result)   Collection Time: 12/05/15 10:05 PM  Result Value Ref Range Status   Specimen Description BLOOD BLOOD RIGHT FOREARM  Final   Special Requests BOTTLES DRAWN AEROBIC AND ANAEROBIC 5ML  Final   Culture NO GROWTH 2 DAYS  Final   Report Status PENDING  Incomplete    Coagulation Studies:  Recent Labs  12/04/15 2240 12/06/15 1309 12/07/15 1013  LABPROT 15.2* 15.5* 15.6*  INR 1.18 1.21 1.22    Urinalysis: No results for input(s): COLORURINE, LABSPEC, PHURINE, GLUCOSEU, HGBUR, BILIRUBINUR, KETONESUR, PROTEINUR, UROBILINOGEN, NITRITE, LEUKOCYTESUR in the last 72 hours.  Invalid input(s): APPERANCEUR    Imaging: Dg Chest Port 1 View  12/06/2015  CLINICAL DATA:  Hypoxia, history hypertension, diabetes mellitus, asthma, COPD, end-stage renal disease on  dialysis, coronary artery disease EXAM: PORTABLE CHEST 1 VIEW COMPARISON:  Portable exam 0957 hours compared to 12/04/2015 FINDINGS: Dialysis catheter via infra diaphragmatic approach with tip projecting over RIGHT atrium. Wall stent identified at the LEFT brachiocephalic vein. Enlargement of cardiac silhouette with pulmonary vascular congestion. Mediastinal contours normal. Question mild atelectasis or infiltrate in retrocardiac LEFT lower lobe. Remaining lungs clear. No pleural effusion or pneumothorax. IMPRESSION: Enlargement of cardiac silhouette with pulmonary vascular congestion. Question mild atelectasis or infiltrate in retrocardiac LEFT lower lobe. Electronically Signed   By: Lavonia Dana M.D.   On: 12/06/2015 10:35     Medications:   . heparin     . antiseptic oral rinse  7 mL Mouth Rinse q12n4p  . aspirin  81 mg Oral Daily  . chlorhexidine  15 mL Mouth Rinse BID  . diltiazem  90 mg Oral Q12H  . docusate sodium  100 mg Oral BID  . epoetin (EPOGEN/PROCRIT) injection  4,000 Units Intravenous Q M,W,F-HD  . insulin aspart  0-5 Units Subcutaneous QHS  . insulin aspart  0-9 Units Subcutaneous TID WC  . labetalol  200 mg Oral TID  . latanoprost  1 drop Both Eyes QHS  . meropenem (MERREM) IV  500 mg Intravenous Q24H  . QUEtiapine  50 mg Oral QHS  . sevelamer carbonate  2,400 mg Oral TID WC  . sodium chloride flush  3 mL Intravenous Q12H  . sodium chloride flush  3 mL Intravenous Q12H  . vancomycin  750 mg Intravenous Q M,W,F-HD  . warfarin  7.5 mg Oral q1800  . Warfarin - Pharmacist Dosing Inpatient   Does not apply q1800   sodium chloride, acetaminophen **OR** acetaminophen, HYDROcodone-acetaminophen, ipratropium-albuterol, labetalol, ondansetron **OR** ondansetron (ZOFRAN) IV, polyethylene glycol, sodium chloride flush  Assessment/ Plan:  65 y.o. male with end-stage renal disease, diabetes, hypertension [facility]/M-W-F/ [facility]  1. End-stage renal disease with  hyperkalemia - routine HD On Friday   2. Atrial fibrillation - Currently treated with anticoagulation, Cardizem drip  3. Anemia of chronic kidney disease - Hemoglobin 10.0 - low dose procrit with HD  4. SHPTH - monitor phos level     LOS: 3 Ulrick Methot 3/16/201712:15 PM

## 2015-12-07 NOTE — Progress Notes (Signed)
Notified Dr. Darvin Neighbours of patient's current BP.  Filed Vitals:   12/07/15 1100 12/07/15 1638  BP: 111/72 103/56  Pulse: 79 62  Temp: 98.6 F (37 C)   Resp: 20    Per MD okay to hold BP med. Will continue to monitor. Horton Finer

## 2015-12-07 NOTE — Progress Notes (Signed)
ANTICOAGULATION CONSULT NOTE- follow up [place] for Coumadin Indication: atrial fibrillation  Allergies  Allergen Reactions  . Penicillins Other (See Comments)    Reaction:  Unknown     Patient Measurements: Height: 5\' 9"  (175.3 cm) Weight: 170 lb 15.1 oz (77.54 kg) IBW/kg (Calculated) : 70.7   Vital Signs: Temp: 98.6 F (37 C) (03/16 1100) Temp Source: Oral (03/16 1100) BP: 111/72 mmHg (03/16 1100) Pulse Rate: 79 (03/16 1100)  Labs:  Recent Labs  12/04/15 1544 12/04/15 2240  12/05/15 0422 12/05/15 1507 12/06/15 0245 12/06/15 1309 12/06/15 2216 12/07/15 0831 12/07/15 1013  HGB 11.1*  --   --  11.0*  --  10.3* 9.9*  --  10.0*  --   HCT 33.8*  --   --  33.4*  --  31.1* 30.3*  --  30.3*  --   PLT 154  --   --  148*  --  126* 143*  --  145*  --   APTT  --  53*  --   --   --   --   --   --   --   --   LABPROT  --  15.2*  --   --   --   --  15.5*  --   --  15.6*  INR  --  1.18  --   --   --   --  1.21  --   --  1.22  HEPARINUNFRC  --   --   < > 0.39 0.11* 0.30 0.22* 0.29* 2.00* 0.23*  CREATININE 11.79*  --   --  12.44* 8.39*  --  9.94*  --   --   --   TROPONINI 0.05* 0.06*  --  0.05*  --   --   --   --   --   --   < > = values in this interval not displayed.  Estimated Creatinine Clearance: 7.5 mL/min (by C-G formula based on Cr of 9.94).   Medical History: Past Medical History  Diagnosis Date  . Cancer ([place])   . Arthritis   . Renal disorder   . Hypertension   . Diabetes mellitus without complication ([place])   . Asthma   . COPD (chronic obstructive pulmonary disease) ([place])   . Coronary artery disease     Medications:  Scheduled:  . antiseptic oral rinse  7 mL Mouth Rinse q12n4p  . aspirin  81 mg Oral Daily  . chlorhexidine  15 mL Mouth Rinse BID  . diltiazem  90 mg Oral Q12H  . docusate sodium  100 mg Oral BID  . epoetin (EPOGEN/PROCRIT) injection  4,000 Units Intravenous Q M,W,F-HD  . insulin aspart  0-5 Units Subcutaneous QHS  .  insulin aspart  0-9 Units Subcutaneous TID WC  . labetalol  200 mg Oral TID  . latanoprost  1 drop Both Eyes QHS  . QUEtiapine  50 mg Oral QHS  . sevelamer carbonate  2,400 mg Oral TID WC  . sodium chloride flush  3 mL Intravenous Q12H  . sodium chloride flush  3 mL Intravenous Q12H  . vancomycin  750 mg Intravenous Q M,W,F-HD  . warfarin  7.5 mg Oral q1800  . Warfarin - Pharmacist Dosing Inpatient   Does not apply q1800   Infusions:  . heparin      Assessment: 65 y/o M with new-onset atrial fibrillation on heparin drip to bridge to Warfarin.   3/15  INR 1.21  Warfarin  not charted as given 3/16  INR 1.22   Goal of Therapy:  INR 2-3  Plan:  Will continue warfarin 7.5 mg daily. Apparently not given last night 3/15.  f/u AM INR. On Heparin drip. Patient on Vancomycin/Meropenem    Teresita Fanton A 12/07/2015,12:25 PM

## 2015-12-07 NOTE — Clinical Social Work Note (Signed)
Clinical Social Work Assessment  Patient Details  Name: Noah Lewis MRN: [id number] Date of Birth: 1950/11/14  Date of referral:  12/05/15               Reason for consult:  Mental Health Concerns, Suicide Risk/Attempt, Facility Placement                Permission sought to share information with:    Permission granted to share information::     Name::        Agency::     Relationship::     Contact Information:     Housing/Transportation Living arrangements for the past 2 months:  [place] of Information:  Facility Patient Interpreter Needed:  None Criminal Activity/Legal Involvement Pertinent to Current Situation/Hospitalization:  No - Comment as needed Significant Relationships:  None Lives with:    Do you feel safe going back to the place where you live?    Need for family participation in patient care:  Yes (Comment)  Care giving concerns:  Patient was a resident at a [facility].    Social Worker assessment / plan: Holiday representative (CSW) received consult that patient is from a Retail [employer] of [place]. Per chart and RN patient is not alert and oriented. CSW contacted Armed forces logistics/support/administrative officer at group home. Per Noah Lewis patient cannot return to the group home and she has issued him a 30 day notice and per Noah Lewis the notice has expired. Noah Lewis reported that patient refused to take his TB test, refused dialysis and medications. Per Noah Lewis patient has only been at this group home since 10/27/15. Per Noah Lewis patient was getting depressed because he just lost parental rights to his children. Per Noah Lewis patient has been to several group homes and burned his bridges with his family. Per Noah Lewis patient does talk to his mother everyday. Noah Lewis also reported that patient uses oxygen at night. Noah Lewis also reported that patient is a high risk for suicide. CSW discussed case with Dr. Weber Cooks. Patient is not appropriate for group home Lewis of care at this time.  Disposition is undetermined at this time. CSW will continue to follow and assist as needed.     Employment status:  Disabled (Comment on whether or not currently receiving Disability) Insurance information:  Medicaid In [place] PT Recommendations:  Not assessed at this time Information / Referral to community resources:  [place]  Patient/Family's Response to care:  Patient was not able to complete assessment at this time.   Patient/Family's Understanding of and Emotional Response to Diagnosis, Current Treatment, and Prognosis:  Patient was not able to complete assessment at this time. Disposition undetermined at this time.   Emotional Assessment Appearance:  Appears stated age Attitude/Demeanor/Rapport:  Unable to Assess Affect (typically observed):  Unable to Assess Orientation:  Oriented to Self, Fluctuating Orientation (Suspected and/or reported Sundowners) Alcohol / Substance use:  Not Applicable Psych involvement (Current and /or in the community):  Yes (Comment)  Discharge Needs  Concerns to be addressed:  Discharge Planning Concerns Readmission within the last 30 days:  No Current discharge risk:  Chronically ill, Cognitively Impaired, Dependent with Mobility Barriers to Discharge:  Continued Medical Work up   Noah Lewis 12/07/2015, 4:52 PM

## 2015-12-07 NOTE — Care Management (Signed)
Patient is being followed by psychiatry.  It is reported that patient is not responding to staff- does not open eyes. He has a history of "not responding when he wants to.  Discussed the need to sit during his dialysis sessions during progression

## 2015-12-07 NOTE — Progress Notes (Signed)
Initial Nutrition Assessment  DOCUMENTATION CODES:      INTERVENTION:   Meals and Snacks: Cater to patient preferences Medical Food Supplement Therapy: will recommend Nepro Shake po daily, each supplement provides 425 kcal and 19 grams protein   NUTRITION DIAGNOSIS:   Inadequate oral intake related to acute illness as evidenced by meal completion < 25%.  GOAL:   Patient will meet greater than or equal to 90% of their needs  MONITOR:   PO intake, Supplement acceptance, Labs, Weight trends, I & O's  REASON FOR ASSESSMENT:    (Dialysis pt)    ASSESSMENT:    Pt admitted from family care home with new onset afib, hyperkalemia and hyponatremia. Pt with h/o ESRD on HD. Pt sleeping throughout the day per chart review and Nsg. Palliative care consulted as well as psych for pt's severe depression.  Past Medical History  Diagnosis Date  . Cancer ([place])   . Arthritis   . Renal disorder   . Hypertension   . Diabetes mellitus without complication ([place])   . Asthma   . COPD (chronic obstructive pulmonary disease) ([place])   . Coronary artery disease      Diet Order:  Diet renal/carb modified with fluid restriction Diet-HS Snack?: Nothing; Room service appropriate?: Yes; Fluid consistency:: Thin    Current Nutrition: Pt eating 0% per chart review.   Food/Nutrition-Related History: Per MST no decrease in appetite PTA.   Scheduled Medications:  . antiseptic oral rinse  7 mL Mouth Rinse q12n4p  . aspirin  81 mg Oral Daily  . chlorhexidine  15 mL Mouth Rinse BID  . diltiazem  90 mg Oral Q12H  . docusate sodium  100 mg Oral BID  . epoetin (EPOGEN/PROCRIT) injection  4,000 Units Intravenous Q M,W,F-HD  . [START ON 12/08/2015] feeding supplement (NEPRO CARB STEADY)  237 mL Oral Q24H  . insulin aspart  0-5 Units Subcutaneous QHS  . insulin aspart  0-9 Units Subcutaneous TID WC  . labetalol  200 mg Oral TID  . latanoprost  1 drop Both Eyes QHS  . QUEtiapine  50 mg Oral QHS  .  sevelamer carbonate  2,400 mg Oral TID WC  . sodium chloride flush  3 mL Intravenous Q12H  . sodium chloride flush  3 mL Intravenous Q12H  . vancomycin  750 mg Intravenous Q M,W,F-HD  . warfarin  7.5 mg Oral q1800  . Warfarin - Pharmacist Dosing Inpatient   Does not apply q1800    Continuous Medications:  . heparin 1,700 Units/hr (12/07/15 1223)     Electrolyte/Renal Profile and Glucose Profile:   Recent Labs Lab 12/05/15 0422 12/05/15 1507 12/06/15 1309  NA 132* 133* 131*  K 4.7 3.8 4.7  CL 98* 95* 93*  CO2 16* 26 22  BUN 97* 63* 69*  CREATININE 12.44* 8.39* 9.94*  CALCIUM 8.0* 8.1* 7.4*  PHOS  --  3.6  --   GLUCOSE 126* 107* 112*   Protein Profile:  Recent Labs Lab 12/04/15 1544 12/05/15 1507  ALBUMIN 3.4* 3.2*    Gastrointestinal Profile: Last BM:  12/06/2015  Dialysis Output: 2L removed yesterday   Nutrition-Focused Physical Exam Findings:  Unable to complete Nutrition-Focused physical exam at this time.    Weight Change: Per chart review, weight gain   Last BM:  12/06/2015  Height:   Ht Readings from Last 1 Encounters:  12/07/15 5\' 9"  (1.753 m)    Weight:   Wt Readings from Last 1 Encounters:  12/07/15 170 lb 15.1  oz (77.54 kg)   Wt Readings from Last 10 Encounters:  12/07/15 170 lb 15.1 oz (77.54 kg)  10/17/15 170 lb (77.111 kg)  09/21/15 174 lb 4.8 oz (79.062 kg)  08/01/15 141 lb (63.957 kg)  07/06/15 141 lb (63.957 kg)  06/12/15 141 lb (63.957 kg)  06/07/15 141 lb (63.957 kg)  06/01/15 173 lb 8 oz (78.7 kg)  05/24/15 169 lb (76.658 kg)  05/23/15 164 lb 0.4 oz (74.4 kg)    BMI:  Body mass index is 25.23 kg/(m^2).  Estimated Nutritional Needs:   Kcal:  BEE: 1550kcals, TEE: (IF 1.1-1.3)(AF 1.2) [id number]  Protein:  93-116g protein (1.2-1.5g/kg)  Fluid:  UOP+1L  EDUCATION NEEDS:   Education needs no appropriate at this time   [place], RD, LDN Pager [phone] Weekend/On-Call Pager  [phone]

## 2015-12-07 NOTE — Consult Note (Signed)
  Psychiatry: Consult note for this 65 year old man with a history of substance abuse depression probably personality disorder currently in the hospital with severe medical problems and dialysis. For the third time in 3 days I tried to interview the patient. This time spoke to him in his room. For the third time the patient essentially declined to give me any information. He did open his eyes briefly and asked me to give him his cup of water and then drank out of it but then closed his eyes again and would not answer any questions except by grunting. Didn't even answer yes or no to any important questions.  Spoke with social work. Reviewed chart. Evidently his family care home is declining to have him come back.  Really can't do a full evaluation. Hard to tell how much of this is fatigue or delirium from medical condition versus intentional noncompliance versus depression. I tried to engage him in conversation about recent stresses and that didn't help either. Can't even as noted before evaluate capacity although based on his current activity it's hard to say that he does have any capacity to make decisions.  No clear indication for any psychiatric medicine. I will continue to follow-up with patient at this point may just need placement at a skilled nursing facility. The diagnosis of a severe catatonic depression seems unlikely given the time course and his past history but can still be considered.

## 2015-12-07 NOTE — Progress Notes (Signed)
SUBJECTIVE: Patient is difficult to arouse but is wearing a mask 100% oxygen.   Filed Vitals:   12/07/15 0132 12/07/15 0505 12/07/15 0842 12/07/15 0844  BP: 121/62 111/70  110/64  Pulse: 89 89  75  Temp:  97.6 F (36.4 C)    TempSrc:      Resp: 20     Height:      Weight:  170 lb 15.1 oz (77.54 kg)    SpO2: 94% 93% 98%     Intake/Output Summary (Last 24 hours) at 12/07/15 0903 Last data filed at 12/06/15 2227  Gross per 24 hour  Intake 204.05 ml  Output      0 ml  Net 204.05 ml    LABS: Basic Metabolic Panel:  Recent Labs  12/05/15 1507 12/06/15 1309  NA 133* 131*  K 3.8 4.7  CL 95* 93*  CO2 26 22  GLUCOSE 107* 112*  BUN 63* 69*  CREATININE 8.39* 9.94*  CALCIUM 8.1* 7.4*  PHOS 3.6  --    Liver Function Tests:  Recent Labs  12/04/15 1544 12/05/15 1507  AST 33  --   ALT 24  --   ALKPHOS 84  --   BILITOT 0.8  --   PROT 7.7  --   ALBUMIN 3.4* 3.2*   No results for input(s): LIPASE, AMYLASE in the last 72 hours. CBC:  Recent Labs  12/06/15 1309 12/07/15 0831  WBC 11.0* 12.9*  HGB 9.9* 10.0*  HCT 30.3* 30.3*  MCV 86.2 84.8  PLT 143* 145*   Cardiac Enzymes:  Recent Labs  12/04/15 1544 12/04/15 2240 12/05/15 0422  TROPONINI 0.05* 0.06* 0.05*   BNP: Invalid input(s): POCBNP D-Dimer: No results for input(s): DDIMER in the last 72 hours. Hemoglobin A1C: No results for input(s): HGBA1C in the last 72 hours. Fasting Lipid Panel: No results for input(s): CHOL, HDL, LDLCALC, TRIG, CHOLHDL, LDLDIRECT in the last 72 hours. Thyroid Function Tests:  Recent Labs  12/04/15 2240  TSH 7.083*   Anemia Panel:  Recent Labs  12/06/15 0958  VITAMINB12 1038*     PHYSICAL EXAM General: Well developed, well nourished, in no acute distress HEENT:  Normocephalic and atramatic Neck:  No JVD.  Lungs: Clear bilaterally to auscultation and percussion. Heart: HRRR . Normal S1 and S2 without gallops or murmurs.  Abdomen: Bowel sounds are positive,  abdomen soft and non-tender  Msk:  Back normal, normal gait. Normal strength and tone for age. Extremities: No clubbing, cyanosis or edema.   Neuro: Alert and oriented X 3. Psych:  Good affect, responds appropriately  TELEMETRY: Monitor shows atrial fibrillation with ventricular response rate 79 bpm  ASSESSMENT AND PLAN: Atrial fibrillation with controlled ventricular response rate. Bipolar disorder. Renal failure. Patient is not reliable for stroke prophylaxis with Coumadin. Advise making the patient DO NOT RESUSCITATE. Poor prognosis.  Active Problems:   Bipolar depression ([place])   A-fib ([place])    Noah Lewis A, MD, [facility] 12/07/2015 9:03 AM

## 2015-12-08 ENCOUNTER — Inpatient Hospital Stay: Payer: Medicaid Other

## 2015-12-08 DIAGNOSIS — R7881 Bacteremia: Secondary | ICD-10-CM | POA: Insufficient documentation

## 2015-12-08 LAB — HEPARIN LEVEL (UNFRACTIONATED): Heparin Unfractionated: 0.39 IU/mL (ref 0.30–0.70)

## 2015-12-08 LAB — GLUCOSE, CAPILLARY
GLUCOSE-CAPILLARY: 84 mg/dL (ref 65–99)
GLUCOSE-CAPILLARY: 127 mg/dL — AB (ref 65–99)
GLUCOSE-CAPILLARY: 84 mg/dL (ref 65–99)
Glucose-Capillary: 77 mg/dL (ref 65–99)

## 2015-12-08 LAB — CBC
HEMATOCRIT: 29 % — AB (ref 40.0–52.0)
Hemoglobin: 9.6 g/dL — ABNORMAL LOW (ref 13.0–18.0)
MCH: 27.9 pg (ref 26.0–34.0)
MCHC: 33 g/dL (ref 32.0–36.0)
MCV: 84.5 fL (ref 80.0–100.0)
Platelets: 181 10*3/uL (ref 150–440)
RBC: 3.43 MIL/uL — ABNORMAL LOW (ref 4.40–5.90)
RDW: 17.1 % — AB (ref 11.5–14.5)
WBC: 13.6 10*3/uL — AB (ref 3.8–10.6)

## 2015-12-08 LAB — PROTIME-INR
INR: 1.18
Prothrombin Time: 15.2 seconds — ABNORMAL HIGH (ref 11.4–15.0)

## 2015-12-08 MED ORDER — DILTIAZEM HCL ER 90 MG PO CP12
90.0000 mg | ORAL_CAPSULE | Freq: Two times a day (BID) | ORAL | Status: DC
  Administered 2015-12-09: 90 mg via ORAL
  Filled 2015-12-08 (×3): qty 1

## 2015-12-08 MED ORDER — MIRTAZAPINE 15 MG PO TBDP
15.0000 mg | ORAL_TABLET | Freq: Every day | ORAL | Status: DC
  Administered 2015-12-13 – 2015-12-14 (×2): 15 mg via ORAL
  Filled 2015-12-08 (×7): qty 1

## 2015-12-08 MED ORDER — ASPIRIN EC 325 MG PO TBEC
325.0000 mg | DELAYED_RELEASE_TABLET | Freq: Every day | ORAL | Status: DC
  Administered 2015-12-12 – 2015-12-14 (×3): 325 mg via ORAL
  Filled 2015-12-08 (×6): qty 1

## 2015-12-08 MED ORDER — ASPIRIN EC 325 MG PO TBEC: 325.0000 mg | DELAYED_RELEASE_TABLET | Freq: Every day | ORAL | Status: DC

## 2015-12-08 MED ORDER — LABETALOL HCL 100 MG PO TABS
100.0000 mg | ORAL_TABLET | Freq: Two times a day (BID) | ORAL | Status: DC
  Administered 2015-12-09: 100 mg via ORAL
  Filled 2015-12-08: qty 1

## 2015-12-08 MED ORDER — LABETALOL HCL 100 MG PO TABS: 100.0000 mg | ORAL_TABLET | Freq: Two times a day (BID) | ORAL | Status: DC

## 2015-12-08 NOTE — Progress Notes (Signed)
Patient has continuous pulse oximetry hooked up to his telemetry box. Patient refuses to keep it on his finger, taking it off every time I put it back on. Will keep trying. Patient also tried to swing and the NT this AM when getting his CBG.

## 2015-12-08 NOTE — Progress Notes (Signed)
Subjective:  Patient seen and evaluated during hemodialysis today. He was found to be rather lethargic today but was arousable. Ultrafiltration target increased to 2 kg.  Objective:  Vital signs in last 24 hours:  Temp:  [98.1 F (36.7 C)-98.6 F (37 C)] 98.1 F (36.7 C) (03/17 1025) Pulse Rate:  [62-83] 83 (03/17 1230) Resp:  [14-24] 14 (03/17 1230) BP: (100-142)/(56-88) 138/78 mmHg (03/17 1230) SpO2:  [52 %-100 %] 100 % (03/17 1230) Weight:  [75.796 kg (167 lb 1.6 oz)-76.8 kg (169 lb 5 oz)] 76.8 kg (169 lb 5 oz) (03/17 1025)  Weight change: -2.004 kg (-4 lb 6.7 oz) Filed Weights   12/07/15 0505 12/08/15 0554 12/08/15 1025  Weight: 77.54 kg (170 lb 15.1 oz) 75.796 kg (167 lb 1.6 oz) 76.8 kg (169 lb 5 oz)    Intake/Output:    Intake/Output Summary (Last 24 hours) at 12/08/15 1239 Last data filed at 12/08/15 0830  Gross per 24 hour  Intake      0 ml  Output      0 ml  Net      0 ml     Physical Exam: General: Chronically ill-appearing, laying in the bed   HEENT Moist oral mucous membranes   Neck Supple  Pulm/lungs Normal respiratory effort,  Scattered rhonchi  CVS/Heart Irregular rhythm   Abdomen:  Soft, nontender, BS present  Extremities:  + Dependent edema   Neurologic: Lethargic but is arousable  Skin: No acute rashes   Access: Left arm AV fistula        Basic Metabolic Panel:   Recent Labs Lab 12/04/15 1544 12/04/15 2240 12/05/15 0422 12/05/15 1507 12/06/15 1309  NA 133*  --  132* 133* 131*  K 6.1* 5.0 4.7 3.8 4.7  CL 97*  --  98* 95* 93*  CO2 19*  --  16* 26 22  GLUCOSE 96  --  126* 107* 112*  BUN 88*  --  97* 63* 69*  CREATININE 11.79*  --  12.44* 8.39* 9.94*  CALCIUM 8.5*  --  8.0* 8.1* 7.4*  PHOS  --   --   --  3.6  --      CBC:  Recent Labs Lab 12/05/15 0422 12/06/15 0245 12/06/15 1309 12/07/15 0831 12/08/15 0252  WBC 7.8 8.4 11.0* 12.9* 13.6*  HGB 11.0* 10.3* 9.9* 10.0* 9.6*  HCT 33.4* 31.1* 30.3* 30.3* 29.0*  MCV 86.4 86.4  86.2 84.8 84.5  PLT 148* 126* 143* 145* 181      Microbiology:  Recent Results (from the past 720 hour(s))  MRSA PCR Screening     Status: None   Collection Time: 12/04/15 10:40 PM  Result Value Ref Range Status   MRSA by PCR NEGATIVE NEGATIVE Final    Comment:        The GeneXpert MRSA Assay (FDA approved for NASAL specimens only), is one component of a comprehensive MRSA colonization surveillance program. It is not intended to diagnose MRSA infection nor to guide or monitor treatment for MRSA infections.   CULTURE, BLOOD (ROUTINE X 2) w Reflex to PCR ID Panel     Status: None (Preliminary result)   Collection Time: 12/05/15 10:00 PM  Result Value Ref Range Status   Specimen Description BLOOD RIGHT ANTECUBITAL  Final   Special Requests BOTTLES DRAWN AEROBIC AND ANAEROBIC 5ML  Final   Culture  Setup Time   Final    GRAM POSITIVE COCCI ANAEROBIC BOTTLE ONLY CRITICAL RESULT CALLED TO, READ BACK BY AND  VERIFIED WITH: MELISSA MACCIA 12/07/15 1458    Culture   Final    STAPHYLOCOCCUS AUREUS SUSCEPTIBILITIES TO FOLLOW    Report Status PENDING  Incomplete  Blood Culture ID Panel (Reflexed)     Status: Abnormal   Collection Time: 12/05/15 10:00 PM  Result Value Ref Range Status   Enterococcus species NOT DETECTED NOT DETECTED Final   Vancomycin resistance NOT DETECTED NOT DETECTED Final   Listeria monocytogenes NOT DETECTED NOT DETECTED Final   Staphylococcus species DETECTED (A) NOT DETECTED Final    Comment: CRITICAL RESULT CALLED TO, READ BACK BY AND VERIFIED WITH: MELISSA MECCIA 12/07/15 1458 MLM    Staphylococcus aureus DETECTED (A) NOT DETECTED Final    Comment: CRITICAL RESULT CALLED TO, READ BACK BY AND VERIFIED WITH: MELISSA MECCIA 12/07/15 1458 MLM    Methicillin resistance NOT DETECTED NOT DETECTED Final   Streptococcus species NOT DETECTED NOT DETECTED Final   Streptococcus agalactiae NOT DETECTED NOT DETECTED Final   Streptococcus pneumoniae NOT DETECTED NOT  DETECTED Final   Streptococcus pyogenes NOT DETECTED NOT DETECTED Final   Acinetobacter baumannii NOT DETECTED NOT DETECTED Final   Enterobacteriaceae species NOT DETECTED NOT DETECTED Final   Enterobacter cloacae complex NOT DETECTED NOT DETECTED Final   Escherichia coli NOT DETECTED NOT DETECTED Final   Klebsiella oxytoca NOT DETECTED NOT DETECTED Final   Klebsiella pneumoniae NOT DETECTED NOT DETECTED Final   Proteus species NOT DETECTED NOT DETECTED Final   Serratia marcescens NOT DETECTED NOT DETECTED Final   Carbapenem resistance NOT DETECTED NOT DETECTED Final   Haemophilus influenzae NOT DETECTED NOT DETECTED Final   Neisseria meningitidis NOT DETECTED NOT DETECTED Final   Pseudomonas aeruginosa NOT DETECTED NOT DETECTED Final   Candida albicans NOT DETECTED NOT DETECTED Final   Candida glabrata NOT DETECTED NOT DETECTED Final   Candida krusei NOT DETECTED NOT DETECTED Final   Candida parapsilosis NOT DETECTED NOT DETECTED Final   Candida tropicalis NOT DETECTED NOT DETECTED Final  CULTURE, BLOOD (ROUTINE X 2) w Reflex to PCR ID Panel     Status: None (Preliminary result)   Collection Time: 12/05/15 10:05 PM  Result Value Ref Range Status   Specimen Description BLOOD BLOOD RIGHT FOREARM  Final   Special Requests BOTTLES DRAWN AEROBIC AND ANAEROBIC 5ML  Final   Culture NO GROWTH 2 DAYS  Final   Report Status PENDING  Incomplete    Coagulation Studies:  Recent Labs  12/06/15 1309 12/07/15 1013 12/08/15 0252  LABPROT 15.5* 15.6* 15.2*  INR 1.21 1.22 1.18    Urinalysis: No results for input(s): COLORURINE, LABSPEC, PHURINE, GLUCOSEU, HGBUR, BILIRUBINUR, KETONESUR, PROTEINUR, UROBILINOGEN, NITRITE, LEUKOCYTESUR in the last 72 hours.  Invalid input(s): APPERANCEUR    Imaging: No results found.   Medications:     . antiseptic oral rinse  7 mL Mouth Rinse q12n4p  . aspirin EC  325 mg Oral Daily  . chlorhexidine  15 mL Mouth Rinse BID  . diltiazem  90 mg Oral  Q12H  . docusate sodium  100 mg Oral BID  . epoetin (EPOGEN/PROCRIT) injection  4,000 Units Intravenous Q M,W,F-HD  . feeding supplement (NEPRO CARB STEADY)  237 mL Oral Q24H  . insulin aspart  0-5 Units Subcutaneous QHS  . insulin aspart  0-9 Units Subcutaneous TID WC  . labetalol  100 mg Oral BID  . latanoprost  1 drop Both Eyes QHS  . QUEtiapine  50 mg Oral QHS  . sevelamer carbonate  2,400 mg Oral TID WC  .  sodium chloride flush  3 mL Intravenous Q12H  . sodium chloride flush  3 mL Intravenous Q12H  . vancomycin  750 mg Intravenous Q M,W,F-HD   sodium chloride, acetaminophen **OR** acetaminophen, HYDROcodone-acetaminophen, ipratropium-albuterol, labetalol, ondansetron **OR** ondansetron (ZOFRAN) IV, polyethylene glycol, sodium chloride flush  Assessment/ Plan:  65 y.o. male with end-stage renal disease, diabetes, hypertension [employer]/M-W-F/ [facility]  1. End-stage renal disease with hyperkalemia - patient seen and evaluated during dialysis today.  Ultrafiltration target increased to 2 kg.   2. Atrial fibrillation - patient currently on oral diltiazem.  3. Anemia of chronic kidney disease - continue Epogen 4000 units IV with dialysis.  4. SHPTH - continue renvela at its current doseage.     LOS: 4 Sloan Galentine 3/17/201712:39 PM

## 2015-12-08 NOTE — Progress Notes (Signed)
Hassan Rowan from [facility] called to check in on patient and stated she knows the patient very well and would be happy to give any information needed to see what patient's baseline is. She stated the patient "normally cuts up a lot with [place] and is very enjoyable." She also stated he doesn't like to be stuck, but you can "sweet talk" him and he's fine. Her contact number is [phone].

## 2015-12-08 NOTE — Progress Notes (Signed)
HD Tx Termination 

## 2015-12-08 NOTE — Consult Note (Signed)
  Psychiatry: Follow-up 65 year old man with history of depression substance abuse and currently very sick with end-stage renal disease. Once again tried to talk with patient today with minimal success. He does seem to be awake. He was speaking slightly to a visitor but not having a full conversation. Not able to answer any complete questions.  Based on previous readings in the chart it appears more likely that he has depression than true bipolar disorder. Rather than the low-dose of Seroquel 50 mg at night I'm going to replace it with 15 mg of mirtazapine which may start to help with depression as well. Continue monitoring and I will see him after the weekend.

## 2015-12-08 NOTE — Progress Notes (Signed)
ANTICOAGULATION CONSULT NOTE- follow up [place] for Coumadin Indication: atrial fibrillation  Allergies  Allergen Reactions  . Penicillins Other (See Comments)    Reaction:  Unknown     Patient Measurements: Height: 5\' 9"  (175.3 cm) Weight: 167 lb 1.6 oz (75.796 kg) IBW/kg (Calculated) : 70.7   Vital Signs: Temp: 98.6 F (37 C) (03/17 0554) Temp Source: Oral (03/17 0554) BP: 121/63 mmHg (03/17 0554) Pulse Rate: 64 (03/17 0554)  Labs:  Recent Labs  12/05/15 1507  12/06/15 1309  12/07/15 0831 12/07/15 1013 12/07/15 1744 12/08/15 0252  HGB  --   < > 9.9*  --  10.0*  --   --  9.6*  HCT  --   < > 30.3*  --  30.3*  --   --  29.0*  PLT  --   < > 143*  --  145*  --   --  181  LABPROT  --   --  15.5*  --   --  15.6*  --  15.2*  INR  --   --  1.21  --   --  1.22  --  1.18  HEPARINUNFRC 0.11*  < > 0.22*  < > 2.00* 0.23* 0.12* 0.39  CREATININE 8.39*  --  9.94*  --   --   --   --   --   < > = values in this interval not displayed.  Estimated Creatinine Clearance: 7.5 mL/min (by C-G formula based on Cr of 9.94).   Medical History: Past Medical History  Diagnosis Date  . Cancer ([place])   . Arthritis   . Renal disorder   . Hypertension   . Diabetes mellitus without complication ([place])   . Asthma   . COPD (chronic obstructive pulmonary disease) ([place])   . Coronary artery disease     Medications:  Scheduled:  . antiseptic oral rinse  7 mL Mouth Rinse q12n4p  . aspirin  81 mg Oral Daily  . chlorhexidine  15 mL Mouth Rinse BID  . diltiazem  90 mg Oral Q12H  . docusate sodium  100 mg Oral BID  . epoetin (EPOGEN/PROCRIT) injection  4,000 Units Intravenous Q M,W,F-HD  . feeding supplement (NEPRO CARB STEADY)  237 mL Oral Q24H  . insulin aspart  0-5 Units Subcutaneous QHS  . insulin aspart  0-9 Units Subcutaneous TID WC  . labetalol  100 mg Oral BID  . latanoprost  1 drop Both Eyes QHS  . QUEtiapine  50 mg Oral QHS  . sevelamer carbonate  2,400 mg Oral  TID WC  . sodium chloride flush  3 mL Intravenous Q12H  . sodium chloride flush  3 mL Intravenous Q12H  . vancomycin  750 mg Intravenous Q M,W,F-HD  . warfarin  7.5 mg Oral q1800  . Warfarin - Pharmacist Dosing Inpatient   Does not apply q1800   Infusions:  . heparin 1,950 Units/hr (12/07/15 2041)    Assessment: 65 y/o M with new-onset atrial fibrillation on heparin drip to bridge to Warfarin.   3/15  INR 1.21  Warfarin not charted as given 3/16  INR 1.22  Warfarin charted as refused by pt/family   3/17  INR 1.18  Goal of Therapy:  INR 2-3  Plan:  Will continue warfarin 7.5 mg daily.  Apparently not given 3/15 or last night 3/16.  f/u AM INR. On Heparin drip. Patient on Vancomycin   Youa Deloney A 12/08/2015,9:13 AM

## 2015-12-08 NOTE — H&P (Signed)
[place] [facility] [place] NAME: Noah Lewis    MR#:  [id number]  DATE OF BIRTH:  10-14-1950  SUBJECTIVE:  CHIEF COMPLAINT:   Chief Complaint  Patient presents with  . Back Pain   More awake today. Seems confused. Follows commands. Continues to be on nonrebreather.  REVIEW OF SYSTEMS:   ROS unobtainable due to drowziness  DRUG ALLERGIES:   Allergies  Allergen Reactions  . Penicillins Other (See Comments)    Reaction:  Unknown     VITALS:  Blood pressure 121/63, pulse 64, temperature 98.6 F (37 C), temperature source Oral, resp. rate 24, height 5\' 9"  (1.753 m), weight 75.796 kg (167 lb 1.6 oz), SpO2 93 %.  PHYSICAL EXAMINATION:  GENERAL:  65 y.o.-year-old patient lying in the bed , drowsy EYES: Pupils equal, round, reactive to light and accommodation. No scleral icterus. Extraocular muscles intact.  HEENT: Head atraumatic, normocephalic. Oropharynx and nasopharynx clear.  NECK:  Supple, no jugular venous distention. No thyroid enlargement, no tenderness.  LUNGS: Bilateral Crackles and rhonchi. CARDIOVASCULAR: S1, S2 normal. No murmurs, rubs, or gallops.  ABDOMEN: Soft, nontender, nondistended. Bowel sounds present. No organomegaly or mass.  EXTREMITIES: No pedal edema, cyanosis, or clubbing.  NEUROLOGIC: Patient is able to squeeze my fingers. Moves his feet minimally. Does not follow all commands. PSYCHIATRIC: The patient is drowsy SKIN: No obvious rash, lesion, or ulcer.    LABORATORY PANEL:   CBC  Recent Labs Lab 12/08/15 0252  WBC 13.6*  HGB 9.6*  HCT 29.0*  PLT 181   ------------------------------------------------------------------------------------------------------------------  Chemistries   Recent Labs Lab 12/04/15 1544  12/06/15 1309  NA 133*  < > 131*  K 6.1*  < > 4.7  CL 97*  < > 93*  CO2 19*  < > 22  GLUCOSE 96  < > 112*  BUN 88*  < > 69*  CREATININE 11.79*  < > 9.94*  CALCIUM 8.5*  <  > 7.4*  AST 33  --   --   ALT 24  --   --   ALKPHOS 84  --   --   BILITOT 0.8  --   --   < > = values in this interval not displayed. ------------------------------------------------------------------------------------------------------------------  Cardiac Enzymes  Recent Labs Lab 12/05/15 0422  TROPONINI 0.05*   ------------------------------------------------------------------------------------------------------------------  RADIOLOGY:  Dg Chest Port 1 View  12/06/2015  CLINICAL DATA:  Hypoxia, history hypertension, diabetes mellitus, asthma, COPD, end-stage renal disease on dialysis, coronary artery disease EXAM: PORTABLE CHEST 1 VIEW COMPARISON:  Portable exam 0957 hours compared to 12/04/2015 FINDINGS: Dialysis catheter via infra diaphragmatic approach with tip projecting over RIGHT atrium. Wall stent identified at the LEFT brachiocephalic vein. Enlargement of cardiac silhouette with pulmonary vascular congestion. Mediastinal contours normal. Question mild atelectasis or infiltrate in retrocardiac LEFT lower lobe. Remaining lungs clear. No pleural effusion or pneumothorax. IMPRESSION: Enlargement of cardiac silhouette with pulmonary vascular congestion. Question mild atelectasis or infiltrate in retrocardiac LEFT lower lobe. Electronically Signed   By: Lavonia Dana M.D.   On: 12/06/2015 10:35    EKG:   Orders placed or performed during the hospital encounter of 12/04/15  . ED EKG  . ED EKG    ASSESSMENT AND PLAN:   * Acute encephalopathy Seems to be multifactorial due to depression, acute infection. Ammonia, B12, TSH levels were normal. We'll check a CT scan of the head without contrast. ABG showed normal CO2.  * Acute on chronic diastolic  CHF with acute hypoxic respiratory failure - on nonrebreather His saturations are in the 50s off the NONREBREATHER. Chest x-ray showed pulmonary edema.  Will need further fluid taken out of the dialysis.  * New onset Atrial  fibrillation On oral Cardizem.  * MSSA bacteremia - afebrile Blood cultures drawn at the dialysis center on 12/02/2015. cultures repeated in the hospital on 12/05/2015 Have MSSA. On vancomycin. Appreciate ID input. MRI of the lumbar spine pending. Has penicillin allergy.  * Hyperkalemia with end-stage renal disease. Improved with Kayexalate, IV insulin and D50 In the emergency room Continue HD.  * Hypertension. Continue labetalol and cardizem. Vasotec held due to hyperkalemia on admission. His blood pressure has been low normal and I have reduced labetalol dose.  * Severe depression Appreciate Dr. Weber Cooks seeing the patient.  * Chronic low back pain.  Pain control. Discontinued morphine.  * Diabetes mellitus Sliding scale.  * DVT prophylaxis. Patient is on heparin drip.  Also discussed case with Dr. Megan Salon of palliative care as patient has been refusing his dialysis and medications. We are trying to figure out who makes decisions for the patient at this time. Discussed with his mother over the phone, [name] at [phone]. She mentions his children are 52 and 65 years old.  All the records are reviewed and case discussed with Care Management/Social Workerr. Management plans discussed with the patient, mother and they are in agreement.  CODE STATUS: full code.  TOTAL CC TIME TAKING CARE OF THIS PATIENT: 35 minutes.   POSSIBLE D/C IN 3-4 DAYS, DEPENDING ON CLINICAL CONDITION.  Hillary Bow R M.D on 12/08/2015 at 9:57 AM  Between 7am to 6pm - Pager - [phone]  After 6pm go to [email or web address] - password [email or web address] [facility]  Office  [phone]  CC: Primary care physician; Elyse Jarvis, MD

## 2015-12-08 NOTE — Progress Notes (Signed)
Patient was able to wake up enough to eat some lunch after dialysis. Assisted patient with eating. Placed patient on 6L nasal canula while eating and oxygen saturation stayed around 85%. Placed patient back on non-rebreather after finished eating and saturation came back up to 98%. While patient is so sleepy, will keep on non-rebreather due to sleep apnea and patient being a mouth breather.

## 2015-12-08 NOTE — Progress Notes (Signed)
HD Tx Pre Assessment 

## 2015-12-08 NOTE — Progress Notes (Signed)
Post HD Tx Assessment  

## 2015-12-08 NOTE — Progress Notes (Signed)
[facility] INFECTIOUS DISEASE PROGRESS NOTE Date of Admission:  12/04/2015     ID: Noah Lewis is a 65 y.o. male with  ESRD, MSSA bacteremia and back pain Active Problems:   Bipolar depression ([place])   A-fib ([place])   Subjective: Fu bcx +, from admission. Repeat sent. A little more alert but not answering questions . Sitting up in bed, food at bedside but not eating. Does not really seem to understand me  ROS  Unable to obtain   Medications:  Antibiotics Given (last 72 hours)    Date/Time Action Medication Dose Rate   12/05/15 1846 Given   vancomycin (VANCOCIN) 1,750 mg in sodium chloride 0.9 % 500 mL IVPB 1,750 mg 250 mL/hr   12/06/15 1251 Given   meropenem (MERREM) 500 mg in sodium chloride 0.9 % 50 mL IVPB 500 mg 100 mL/hr   12/06/15 1715 Given   vancomycin (VANCOCIN) IVPB 750 mg/150 ml premix 750 mg 150 mL/hr   12/08/15 1241 Given   vancomycin (VANCOCIN) IVPB 750 mg/150 ml premix 750 mg 150 mL/hr     . antiseptic oral rinse  7 mL Mouth Rinse q12n4p  . aspirin EC  325 mg Oral Daily  . chlorhexidine  15 mL Mouth Rinse BID  . diltiazem  90 mg Oral Q12H  . docusate sodium  100 mg Oral BID  . epoetin (EPOGEN/PROCRIT) injection  4,000 Units Intravenous Q M,W,F-HD  . feeding supplement (NEPRO CARB STEADY)  237 mL Oral Q24H  . insulin aspart  0-5 Units Subcutaneous QHS  . insulin aspart  0-9 Units Subcutaneous TID WC  . labetalol  100 mg Oral BID  . latanoprost  1 drop Both Eyes QHS  . QUEtiapine  50 mg Oral QHS  . sevelamer carbonate  2,400 mg Oral TID WC  . sodium chloride flush  3 mL Intravenous Q12H  . sodium chloride flush  3 mL Intravenous Q12H  . vancomycin  750 mg Intravenous Q M,W,F-HD    Objective: Vital signs in last 24 hours: Temp:  [98.1 F (36.7 C)-98.6 F (37 C)] 98.2 F (36.8 C) (03/17 1345) Pulse Rate:  [62-102] 85 (03/17 1421) Resp:  [14-24] 18 (03/17 1421) BP: (100-153)/(56-90) 126/87 mmHg (03/17 1421) SpO2:  [52 %-100 %] 100 % (03/17  1421) Weight:  [73.9 kg (162 lb 14.7 oz)-76.8 kg (169 lb 5 oz)] 73.9 kg (162 lb 14.7 oz) (03/17 1345) Constitutional: lethargic barely arousable HENT: muddy sclera Mouth/Throat: Oropharynx is clear and dry. No oropharyngeal exudate.  Cardiovascular: Distant, No murmur heard.  Pulmonary/Chest: Effort normal and breath sounds normal. No respiratory distress. He has no wheezes.  Abdominal: Soft. Obese Bowel sounds are normal. He exhibits no distension. There is no tenderness.  Lymphadenopathy: He has no cervical adenopathy.  Neurological: lethargic Skin: Skin is warm and dry. LUE with chronic edema, L avf site with no tenderness or erythema noted Psychiatric: awake but min interactive   Lab Results  Recent Labs  12/06/15 1309 12/07/15 0831 12/08/15 0252  WBC 11.0* 12.9* 13.6*  HGB 9.9* 10.0* 9.6*  HCT 30.3* 30.3* 29.0*  NA 131*  --   --   K 4.7  --   --   CL 93*  --   --   CO2 22  --   --   BUN 69*  --   --   CREATININE 9.94*  --   --     Microbiology: Results for orders placed or performed during the hospital encounter of 12/04/15  MRSA PCR Screening     Status: None   Collection Time: 12/04/15 10:40 PM  Result Value Ref Range Status   MRSA by PCR NEGATIVE NEGATIVE Final    Comment:        The GeneXpert MRSA Assay (FDA approved for NASAL specimens only), is one component of a comprehensive MRSA colonization surveillance program. It is not intended to diagnose MRSA infection nor to guide or monitor treatment for MRSA infections.   CULTURE, BLOOD (ROUTINE X 2) w Reflex to PCR ID Panel     Status: None (Preliminary result)   Collection Time: 12/05/15 10:00 PM  Result Value Ref Range Status   Specimen Description BLOOD RIGHT ANTECUBITAL  Final   Special Requests BOTTLES DRAWN AEROBIC AND ANAEROBIC 5ML  Final   Culture  Setup Time   Final    GRAM POSITIVE COCCI ANAEROBIC BOTTLE ONLY CRITICAL RESULT CALLED TO, READ BACK BY AND VERIFIED WITH: MELISSA Story 12/07/15  1458    Culture   Final    STAPHYLOCOCCUS AUREUS SUSCEPTIBILITIES TO FOLLOW    Report Status PENDING  Incomplete  Blood Culture ID Panel (Reflexed)     Status: Abnormal   Collection Time: 12/05/15 10:00 PM  Result Value Ref Range Status   Enterococcus species NOT DETECTED NOT DETECTED Final   Vancomycin resistance NOT DETECTED NOT DETECTED Final   Listeria monocytogenes NOT DETECTED NOT DETECTED Final   Staphylococcus species DETECTED (A) NOT DETECTED Final    Comment: CRITICAL RESULT CALLED TO, READ BACK BY AND VERIFIED WITH: MELISSA MECCIA 12/07/15 1458 MLM    Staphylococcus aureus DETECTED (A) NOT DETECTED Final    Comment: CRITICAL RESULT CALLED TO, READ BACK BY AND VERIFIED WITH: MELISSA MECCIA 12/07/15 1458 MLM    Methicillin resistance NOT DETECTED NOT DETECTED Final   Streptococcus species NOT DETECTED NOT DETECTED Final   Streptococcus agalactiae NOT DETECTED NOT DETECTED Final   Streptococcus pneumoniae NOT DETECTED NOT DETECTED Final   Streptococcus pyogenes NOT DETECTED NOT DETECTED Final   Acinetobacter baumannii NOT DETECTED NOT DETECTED Final   Enterobacteriaceae species NOT DETECTED NOT DETECTED Final   Enterobacter cloacae complex NOT DETECTED NOT DETECTED Final   Escherichia coli NOT DETECTED NOT DETECTED Final   Klebsiella oxytoca NOT DETECTED NOT DETECTED Final   Klebsiella pneumoniae NOT DETECTED NOT DETECTED Final   Proteus species NOT DETECTED NOT DETECTED Final   Serratia marcescens NOT DETECTED NOT DETECTED Final   Carbapenem resistance NOT DETECTED NOT DETECTED Final   Haemophilus influenzae NOT DETECTED NOT DETECTED Final   Neisseria meningitidis NOT DETECTED NOT DETECTED Final   Pseudomonas aeruginosa NOT DETECTED NOT DETECTED Final   Candida albicans NOT DETECTED NOT DETECTED Final   Candida glabrata NOT DETECTED NOT DETECTED Final   Candida krusei NOT DETECTED NOT DETECTED Final   Candida parapsilosis NOT DETECTED NOT DETECTED Final   Candida  tropicalis NOT DETECTED NOT DETECTED Final  CULTURE, BLOOD (ROUTINE X 2) w Reflex to PCR ID Panel     Status: None (Preliminary result)   Collection Time: 12/05/15 10:05 PM  Result Value Ref Range Status   Specimen Description BLOOD BLOOD RIGHT FOREARM  Final   Special Requests BOTTLES DRAWN AEROBIC AND ANAEROBIC 5ML  Final   Culture NO GROWTH 2 DAYS  Final   Report Status PENDING  Incomplete    Studies/Results: No results found.  Assessment/Plan: Noah Lewis is a 65 y.o. male with ESRD admitted after refusing HD and with back pain and now with Staph  aureus bacteremia (CX at HD and + at admission). FU bcx sent 3/17negative. TTE shows no vegetation. Has been on vanco and meropenem  Recommendations Change to ancef  - Would try to Check MRI spine given back pain (L spine at first) when stable - although can hold on this until clinical improvement Will plan on a 6 week course of ancef at HD since high suspicion for osteomyelitis  Thank you very much for the consult. Will follow with you.  [place], [place]   12/08/2015, 3:29 PM

## 2015-12-08 NOTE — Progress Notes (Signed)
ANTICOAGULATION CONSULT NOTE - Follow up Noah Lewis for Heparin  Indication: atrial fibrillation  Allergies  Allergen Reactions  . Penicillins Other (See Comments)    Reaction:  Unknown     Patient Measurements: Height: 5\' 9"  (175.3 cm) Weight: 170 lb 15.1 oz (77.54 kg) IBW/kg (Calculated) : 70.7 Heparin Dosing Weight: 77.8 kg  Vital Signs: Temp: 98.2 F (36.8 C) (03/16 1953) Temp Source: Oral (03/16 1953) BP: 130/67 mmHg (03/16 1953) Pulse Rate: 66 (03/16 1953)  Labs:  Recent Labs  12/05/15 0422 12/05/15 1507  12/06/15 1309  12/07/15 0831 12/07/15 1013 12/07/15 1744 12/08/15 0252  HGB 11.0*  --   < > 9.9*  --  10.0*  --   --  9.6*  HCT 33.4*  --   < > 30.3*  --  30.3*  --   --  29.0*  PLT 148*  --   < > 143*  --  145*  --   --  181  LABPROT  --   --   --  15.5*  --   --  15.6*  --  15.2*  INR  --   --   --  1.21  --   --  1.22  --  1.18  HEPARINUNFRC 0.39 0.11*  < > 0.22*  < > 2.00* 0.23* 0.12* 0.39  CREATININE 12.44* 8.39*  --  9.94*  --   --   --   --   --   TROPONINI 0.05*  --   --   --   --   --   --   --   --   < > = values in this interval not displayed.  Estimated Creatinine Clearance: 7.5 mL/min (by C-G formula based on Cr of 9.94).   Assessment: Pharmacy consulted to dose heparin in this 65 year old male admitted with new onset Afib.  No prior anti-coag noted.  Goal of Therapy:  Heparin level 0.3-0.7 units/ml Monitor platelets by anticoagulation protocol: Yes   Plan:  HL of 0.39 is therapeutic at 0252 this morning. Continue current infusion rate of 1950 units/hr and order confirmatory HL in 8 hours.   [name], Pharm.D Clinical Pharmacist   12/08/2015 4:02 AM

## 2015-12-08 NOTE — Consult Note (Signed)
Pharmacy Antibiotic Note  Noah Lewis is Lewis 65 y.o. male admitted on 12/04/2015 with bacteremia.   Lab called with [facility] results of Staph Aureus in bottle, no methicillin resistance detected. Called Dr. Darvin Lewis about results. Patient is currently on vancomycin. Patient has [place] allergy with unknown reaction  Plan: Recommended cefazolin if we could verify that the allergy was not severe. Per Dr. Geanie Lewis, he will follow up with Dr. Ola Lewis since he is on the case. On Vancomycin 750mg  Q MWF with HD  3/17- per MD- follow up- ID to see again today.  Height: 5\' 9"  (175.3 cm) Weight: 169 lb 5 oz (76.8 kg) IBW/kg (Calculated) : 70.7  Temp (24hrs), Avg:98.3 F (36.8 C), Min:98.1 F (36.7 C), Max:98.6 F (37 C)   Recent Labs Lab 12/04/15 1544 12/05/15 0422 12/05/15 1507 12/06/15 0245 12/06/15 1309 12/07/15 0831 12/08/15 0252  WBC 8.5 7.8  --  8.4 11.0* 12.9* 13.6*  CREATININE 11.79* 12.44* 8.39*  --  9.94*  --   --     Estimated Creatinine Clearance: 7.5 mL/min (by C-G formula based on Cr of 9.94).    Allergies  Allergen Reactions  . Penicillins Other (See Comments)    Reaction:  Unknown     Antimicrobials this admission: Vancomycin 3/14 >>  Meropenem 3/15 >> 3/16  Dose adjustments this admission:   Microbiology results: 3/14 BCx: staph aureus -  Blood cx from [facility] dialysis: G[facility] in clusters in 2/2 per MD  3/13 MRSA PCR: negative  Thank you for allowing pharmacy to be Lewis part of this patient's care.  Noah Lewis 12/08/2015 2:22 PM

## 2015-12-08 NOTE — Care Management (Signed)
A friend of patient came to visit.  Patient was alert and engaged in conversation

## 2015-12-08 NOTE — Progress Notes (Signed)
Pre HD Tx Machine & Patient Checks 

## 2015-12-08 NOTE — Progress Notes (Signed)
After coming back from dialysis patient refused to take medications. Most medications that were scheduled this morning are bid medications and patient will get them again this evening, so doses would be too close together at this point. Only daily medication patient has missed is aspirin. Heparin drip was discontinued today. BP currently 126/87 and heart rate is 98 afib. Will continue to monitor.

## 2015-12-08 NOTE — Progress Notes (Signed)
HD Tx Initiation 

## 2015-12-08 NOTE — Progress Notes (Signed)
Patient continues to not be cooperative with oxygen and keeping the pulse ox on his finger. Night shift reported patient has been wearing 6 liters during the day and wearing the non-rebreather at night for sleep apnea. Went to the patient's room to change him to a nasal cannula and patient had taken the mask off and the pulse ox. After putting the pulse ox back on, patient's oxygen was in the 50's on room air. 6 liters only brought him up to the low 70's. Placed patient back on non-rebreather and called respiratory. After speaking with respiratory patient did come back up to 93%. Respiratory stated they would come by and check on him in a little while. Notified CCMD that patient needs to be on continuous pulse ox and asked them to call me if he is not showing a reading or it is low. Will continue to monitor.

## 2015-12-08 NOTE — Progress Notes (Addendum)
Palliative Medicine Inpatient Consult Follow Up Note   Name: Noah Lewis Date: 12/08/2015 MRN: [id number]  DOB: 1951/01/14  Referring Physician: Hillary Bow, MD  Palliative Care consult requested for this 65 y.o. male for goals of medical therapy in patient with ESRD and bacteremia.   TODAY'S DISCUSSIONS AND DECISIONS: I was not able to get any conversation out of pt.  He did follow some simple commands for me but was very drousy.  I did find him with his nonrebreather off and in his hands and I placed it back on him and asked for 02 sat reading and was told right away that it was 100%.  Earlier, his sats had been 85% on 6 LPM O2.  This is why he was placed on a nonre-breather (which he had removed).  Pt is to stay on non-rebreather due to sleep apnea and due to being a mouth breather.    He tends to refuse meds on the mornings when he is going to get dialysis. It may be that he does not take labetolol or 12 hr long-acting Cardizem on the mornings he gets dialysis as this regimen would not be unusual.  He has also missed aspirin on those mornings but that can be corrected by scheduling the aspirin for later in the day (so he won't decline it on the days of HD).  Nurses may hold BP meds on the mornings of HD.  We will see if he will become more compliant with this.  He has a bacteremia but could be switched to Cefazolin if PCN allergy is confirmed as not being severe.  Its hard to get any words out of him, so doubt we will be able to clarify this PCN allergy.  Six week course during HD would be indicated per Dr Ola Spurr.  He also recommends that pt get an MRI of his spine given his back pain (to check for osteo/ other). TTE does not show any vegetation.  He has been on a heparin drip and warfarin was almost ordered --but he was started on ASA instead due to him being noncompliant so much with medications (he would be an extremely poor candidate for warfarin).    I see records that seem to  reflect that he is not eating --much of anything. Nothing documented as PO intake for 3 days.  Only 360 ml for Mar 14. And nothing the day prior and only 154 the day prior to that.   Have ordered a Calorie Count --but these are not executed with fine detail often so we will have to see what records we get with this underway.    Social Work notes reflect that pt has no option to return to the [facility] since he refused a tB test, would refuse HD and meds, etc. He has only been at this group home since 10/27/15.  He has lived in other group homes but has reportedly 'burned some bridges' with family due to his behaviors of noncompliance. Pt's two children are young (ages 59 and 34 I believe). His closest first degree relative is his mother.    I called pts mother about two issues:  1)  He is not eating. She attributes much of this to timing of meals not catering to his dialysis schedule. And his tray has been seen by mother to be sitting out of his reach. She also says he is very picky --likes things like [employer], systems. Won't eat a restricted diet.  She says  its also mainly due to not seeing his two daughters, [place] and [place].  The story is that the two girls are being put up for adoption next month --but there is mixed info as to whether his visitation rights were removed or not.  Jerrye Bushy is the DSS contact that pts mother mentioned.  She also complains that his phone is never close to him and she can't call him.  She is aware that he isn't eating and she says it is mostly due to a deep sadness about not seeing his girls. She says that if Marlowe Kays would bring the girls by to see him, he would perk up and eat.  She agrees that if he doesn't eat and were to get a feeding tube that he would just pull it out --so that is not an answer. Liberalizing his diet is needed now --this is done in long term care facilities for pts who do not eat --despite their chronic diseases. It should be done here for pt  as well as it does no good for such a pt  to have a restrictive diet that is not eaten day after day.   Also --his glucose values are wnl so we could cut back his fingersticks to bid.  Would wait to see what they do with a liberalized diet.  2.  Code Status.  She listened and understood. But she was not ready to change his code status from Full Code to DNR.  She will think about it. She brought up that she lost one son a few years ago to an overdose and she can't think about losing another one.  I told her we would talk again about this.   3.  I mentioned possibly trying to get HCPOA status assigned to her (if pt were to have a lucid moment to complete this). She said, "Oh I CAN't DO THAT.".  She does not want to be his HCPOA / POA/ or his guardian. I told her we are going to look to her for decisions anyway since she is the closest relative and when he cannot make decisions, we will have to be calling her.  Perhaps we might mention that pts brother could be HCPOA (if we could get that worked out [employer]).    I touched base and discussed all important issues. But there is ultimately little clarity to what goals of care are to be beyond just continuing what we are doing.     IMPRESSION: ESRD --with refusal of HD recently after an emotionally upsetting event ---HD cath insertion was in 05/2015.  Hyperkalemia Back Pain Depression --has been diagnosed with this before ---record from 02/06/2011 mentions he had a h/o a suicide attempt yrs ago ---notes list one overdose on Clonidine Question of bipolar disorder ----last psychiatry note prior to this stay stated no evidence he is bipolar H/O refusal of HD and h/o leaving AMA and h/o medical noncompliance Profound Hearing Loss bilaterally Apparent h/o prostate cancer CAD with MI in 1986 AFIB with RVR --apparently paroxysmal as he did this in Aug 2016 also HTN COPD/ asthma --with h/o smoking tobacco per previous records (though current notes say  'never smoker') H/O Polysubstance Abuse ---alcohol, cocaine, and marijuana Dyslipidemia Past H/O CHF (details not elaborated upon in old notes) H/O Diverticultis with bowel perforation H/O scrotal abscess --treated surgically Prior h/o elevated LFTs Suspected Sleep Apnea (as per cardiology consult by Dr. Humphrey Rolls)   CODE STATUS: Full code --status remains unchanged after discussion  with mother on 12/08/15.    PAST MEDICAL HISTORY: Past Medical History  Diagnosis Date  . Cancer ([place])   . Arthritis   . Renal disorder   . Hypertension   . Diabetes mellitus without complication ([place])   . Asthma   . COPD (chronic obstructive pulmonary disease) ([place])   . Coronary artery disease     PAST SURGICAL HISTORY:  Past Surgical History  Procedure Laterality Date  . No past surgeries    . Insertion of dialysis catheter    . Insertion of dialysis catheter N/A 05/29/2015    Procedure: INSERTION OF DIALYSIS CATHETER;  Surgeon: Angelia Mould, MD;  Location: [facility];  Service: Vascular;  Laterality: N/A;  . Peripheral vascular catheterization N/A 10/25/2015    Procedure: Dialysis/Perma Catheter Insertion;  Surgeon: Katha Cabal, MD;  Location: [facility];  Service: Cardiovascular;  Laterality: N/A;    Vital Signs: BP 126/87 mmHg  Pulse 85  Temp(Src) 98.2 F (36.8 C) (Axillary)  Resp 18  Ht 5\' 9"  (1.753 m)  Wt 73.9 kg (162 lb 14.7 oz)  BMI 24.05 kg/m2  SpO2 100% Filed Weights   12/08/15 0554 12/08/15 1025 12/08/15 1345  Weight: 75.796 kg (167 lb 1.6 oz) 76.8 kg (169 lb 5 oz) 73.9 kg (162 lb 14.7 oz)    Estimated body mass index is 24.05 kg/(m^2) as calculated from the following:   Height as of this encounter: 5\' 9"  (1.753 m).   Weight as of this encounter: 73.9 kg (162 lb 14.7 oz).  PHYSICAL EXAM: Lying in bed with oxygen off and in his hands (but sats are 100% --I learned later after I asked this to be checked) Hard to arouse --but does open eyes and he looks at me but  does not interact He focuses briefly then closes eyes No JVD or TM Hrt rrr no mgr Lungs decreased BS in bases Abd soft and NT Ext leg muscles are wasted  LABS: CBC:    Component Value Date/Time   WBC 13.6* 12/08/2015 0252   HGB 9.6* 12/08/2015 0252   HCT 29.0* 12/08/2015 0252   PLT 181 12/08/2015 0252   MCV 84.5 12/08/2015 0252   NEUTROABS 4.0 05/29/2015 1450   LYMPHSABS 0.8 05/29/2015 1450   MONOABS 0.7 05/29/2015 1450   EOSABS 0.3 05/29/2015 1450   BASOSABS 0.0 05/29/2015 1450   Comprehensive Metabolic Panel:    Component Value Date/Time   NA 131* 12/06/2015 1309   K 4.7 12/06/2015 1309   CL 93* 12/06/2015 1309   CO2 22 12/06/2015 1309   BUN 69* 12/06/2015 1309   CREATININE 9.94* 12/06/2015 1309   GLUCOSE 112* 12/06/2015 1309   CALCIUM 7.4* 12/06/2015 1309   AST 33 12/04/2015 1544   ALT 24 12/04/2015 1544   ALKPHOS 84 12/04/2015 1544   BILITOT 0.8 12/04/2015 1544   PROT 7.7 12/04/2015 1544   ALBUMIN 3.2* 12/05/2015 1507    Time Spent:  65 min

## 2015-12-08 NOTE — Progress Notes (Cosign Needed)
SUBJECTIVE: Patient is lethargic confused and incoherent   Filed Vitals:   12/08/15 0554 12/08/15 0830 12/08/15 0835 12/08/15 0840  BP: 121/63     Pulse: 64     Temp: 98.6 F (37 C)     TempSrc: Oral     Resp: 24     Height:      Weight: 167 lb 1.6 oz (75.796 kg)     SpO2: 98% 52% 70% 93%    Intake/Output Summary (Last 24 hours) at 12/08/15 0855 Last data filed at 12/08/15 0700  Gross per 24 hour  Intake      0 ml  Output      0 ml  Net      0 ml    LABS: Basic Metabolic Panel:  Recent Labs  12/05/15 1507 12/06/15 1309  NA 133* 131*  K 3.8 4.7  CL 95* 93*  CO2 26 22  GLUCOSE 107* 112*  BUN 63* 69*  CREATININE 8.39* 9.94*  CALCIUM 8.1* 7.4*  PHOS 3.6  --    Liver Function Tests:  Recent Labs  12/05/15 1507  ALBUMIN 3.2*   No results for input(s): LIPASE, AMYLASE in the last 72 hours. CBC:  Recent Labs  12/07/15 0831 12/08/15 0252  WBC 12.9* 13.6*  HGB 10.0* 9.6*  HCT 30.3* 29.0*  MCV 84.8 84.5  PLT 145* 181   Cardiac Enzymes: No results for input(s): CKTOTAL, CKMB, CKMBINDEX, TROPONINI in the last 72 hours. BNP: Invalid input(s): POCBNP D-Dimer: No results for input(s): DDIMER in the last 72 hours. Hemoglobin A1C: No results for input(s): HGBA1C in the last 72 hours. Fasting Lipid Panel: No results for input(s): CHOL, HDL, LDLCALC, TRIG, CHOLHDL, LDLDIRECT in the last 72 hours. Thyroid Function Tests: No results for input(s): TSH, T4TOTAL, T3FREE, THYROIDAB in the last 72 hours.  Invalid input(s): FREET3 Anemia Panel:  Recent Labs  12/06/15 0958  VITAMINB12 1038*     PHYSICAL EXAM General: Well developed, well nourished, in no acute distress HEENT:  Normocephalic and atramatic Neck:  No JVD.  Lungs: Clear bilaterally to auscultation and percussion. Heart: HRRR . Normal S1 and S2 without gallops or murmurs.  Abdomen: Bowel sounds are positive, abdomen soft and non-tender  Msk:  Back normal, normal gait. Normal strength and tone  for age. Extremities: No clubbing, cyanosis or edema.   Neuro: Alert and oriented X 3. Psych:  Good affect, responds appropriately  TELEMETRY: Monitor shows Lewis. fib at 70 bpm  ASSESSMENT AND PLAN: Atrial fibrillation with controlled ventricular response rate #2 CHF #3 staph aureus sepsis and depression. We were consulted because of atrial fibrillation and patient is an controlled ventricular response rate with current medications. Patient is not Lewis candidate for anticoagulation. Prognosis appears to be poor and consideration for DO NOT RESUSCITATE should be made. Since atrial fibrillation ventricular response is well controlled with current medications will sign off.  Active Problems:   Bipolar depression ([place])   Lewis-fib ([place])    Noah Terhune A, MD, [facility] 12/08/2015 8:55 AM

## 2015-12-09 ENCOUNTER — Inpatient Hospital Stay: Payer: Medicaid Other

## 2015-12-09 DIAGNOSIS — M545 Low back pain: Secondary | ICD-10-CM

## 2015-12-09 DIAGNOSIS — R7881 Bacteremia: Secondary | ICD-10-CM

## 2015-12-09 DIAGNOSIS — M549 Dorsalgia, unspecified: Secondary | ICD-10-CM | POA: Insufficient documentation

## 2015-12-09 LAB — BLOOD GAS, ARTERIAL
Acid-Base Excess: 5 mmol/L — ABNORMAL HIGH (ref 0.0–3.0)
BICARBONATE: 29.1 meq/L — AB (ref 21.0–28.0)
FIO2: 100
O2 Saturation: 99.5 %
PH ART: 7.47 — AB (ref 7.350–7.450)
PO2 ART: 160 mmHg — AB (ref 83.0–108.0)
Patient temperature: 37
pCO2 arterial: 40 mmHg (ref 32.0–48.0)

## 2015-12-09 LAB — CBC
HCT: 29.8 % — ABNORMAL LOW (ref 40.0–52.0)
Hemoglobin: 9.8 g/dL — ABNORMAL LOW (ref 13.0–18.0)
MCH: 28.1 pg (ref 26.0–34.0)
MCHC: 32.9 g/dL (ref 32.0–36.0)
MCV: 85.4 fL (ref 80.0–100.0)
PLATELETS: 193 10*3/uL (ref 150–440)
RBC: 3.49 MIL/uL — AB (ref 4.40–5.90)
RDW: 17.1 % — ABNORMAL HIGH (ref 11.5–14.5)
WBC: 12.6 10*3/uL — AB (ref 3.8–10.6)

## 2015-12-09 LAB — GLUCOSE, CAPILLARY
GLUCOSE-CAPILLARY: 102 mg/dL — AB (ref 65–99)
GLUCOSE-CAPILLARY: 115 mg/dL — AB (ref 65–99)
Glucose-Capillary: 122 mg/dL — ABNORMAL HIGH (ref 65–99)
Glucose-Capillary: 112 mg/dL — ABNORMAL HIGH (ref 65–99)

## 2015-12-09 MED ORDER — HALOPERIDOL LACTATE 5 MG/ML IJ SOLN: 2.0000 mg | Freq: Once | INTRAMUSCULAR | Status: DC

## 2015-12-09 MED ORDER — LORAZEPAM 2 MG/ML IJ SOLN: 0.5000 mg | Freq: Every day | INTRAMUSCULAR | Status: DC | PRN

## 2015-12-09 MED ORDER — LORAZEPAM 2 MG/ML IJ SOLN
INTRAMUSCULAR | Status: AC
  Administered 2015-12-09: 0.5 mg
  Filled 2015-12-09: qty 1

## 2015-12-09 NOTE — Plan of Care (Signed)
Problem: Phase II Progression Outcomes Goal: Dyspnea controlled at rest Outcome: Not Met (add Reason) Pt refusing to wear mask

## 2015-12-09 NOTE — Consult Note (Signed)
Patient ID: Noah Lewis, male   DOB: [birth year], 65 y.o.   MRN: [id number]  Reason for consult: Possible back abscess  HPI Noah Lewis is a 65 y.o. male who is currently admitted to the medicine department who requested a surgical consultation by Dr. Ether Griffins for evaluation of a possible back abscess. Patient has blood culture proven MSSA bacteremia and a possible cuff source has been being searched for. Of note patient is also a dialysis-dependent renal failure patient. Upon consultation patient was sleeping. He is sleeping so soundly attempted myself and the nurse to awaken him this was due to hip taking off his face mask in having a desaturation reported. Patient did not cooperate with my exam however on exam there are no complaints of pain as I pressed on his back.  HPI  Past Medical History  Diagnosis Date  . Cancer ([place])   . Arthritis   . Renal disorder   . Hypertension   . Diabetes mellitus without complication ([place])   . Asthma   . COPD (chronic obstructive pulmonary disease) ([place])   . Coronary artery disease     Past Surgical History  Procedure Laterality Date  . No past surgeries    . Insertion of dialysis catheter    . Insertion of dialysis catheter N/A 05/29/2015    Procedure: INSERTION OF DIALYSIS CATHETER;  Surgeon: Angelia Mould, MD;  Location: [facility];  Service: Vascular;  Laterality: N/A;  . Peripheral vascular catheterization N/A 10/25/2015    Procedure: Dialysis/Perma Catheter Insertion;  Surgeon: Katha Cabal, MD;  Location: [facility];  Service: Cardiovascular;  Laterality: N/A;    Family History  Problem Relation Age of Onset  . Hypertension Other   . Diabetes Other     Social History Social History  Substance Use Topics  . Smoking status: Never Smoker   . Smokeless tobacco: Never Used  . Alcohol Use: No    Allergies  Allergen Reactions  . Penicillins Other (See Comments)    Reaction:  Unknown     Current  Facility-Administered Medications  Medication Dose Route Frequency Provider Last Rate Last Dose  . 0.9 %  sodium chloride infusion  250 mL Intravenous PRN Srikar Sudini, MD 10 mL/hr at 12/06/15 0700 250 mL at 12/06/15 0700  . acetaminophen (TYLENOL) tablet 650 mg  650 mg Oral Q6H PRN Hillary Bow, MD       Or  . acetaminophen (TYLENOL) suppository 650 mg  650 mg Rectal Q6H PRN Srikar Sudini, MD      . antiseptic oral rinse (CPC / CETYLPYRIDINIUM CHLORIDE 0.05%) solution 7 mL  7 mL Mouth Rinse q12n4p Lance Coon, MD   7 mL at 12/06/15 2334  . aspirin EC tablet 325 mg  325 mg Oral Daily Colleen Can, MD      . chlorhexidine (PERIDEX) 0.12 % solution 15 mL  15 mL Mouth Rinse BID Lance Coon, MD   15 mL at 12/07/15 2159  . diltiazem (CARDIZEM SR) 12 hr capsule 90 mg  90 mg Oral Q12H Colleen Can, MD   90 mg at 12/09/15 0820  . epoetin alfa (EPOGEN,PROCRIT) injection 4,000 Units  4,000 Units Intravenous Q M,W,F-HD Murlean Iba, MD   4,000 Units at 12/08/15 1201  . haloperidol lactate (HALDOL) injection 2 mg  2 mg Intramuscular Once Theodoro Grist, MD      . HYDROcodone-acetaminophen (NORCO/VICODIN) 5-325 MG per tablet 1-2 tablet  1-2 tablet Oral Q4H PRN Hillary Bow, MD      .  insulin aspart (novoLOG) injection 0-5 Units  0-5 Units Subcutaneous QHS Hillary Bow, MD   0 Units at 12/04/15 2258  . insulin aspart (novoLOG) injection 0-9 Units  0-9 Units Subcutaneous TID WC Hillary Bow, MD   1 Units at 12/05/15 0844  . ipratropium-albuterol (DUONEB) 0.5-2.5 (3) MG/3ML nebulizer solution 3 mL  3 mL Nebulization Q6H PRN Srikar Sudini, MD      . labetalol (NORMODYNE) tablet 100 mg  100 mg Oral BID Colleen Can, MD   100 mg at 12/09/15 [id number]  . labetalol (NORMODYNE,TRANDATE) injection 10 mg  10 mg Intravenous Q2H PRN Murlean Iba, MD   10 mg at 12/06/15 1306  . latanoprost (XALATAN) 0.005 % ophthalmic solution 1 drop  1 drop Both Eyes QHS Hillary Bow, MD   1 drop at 12/08/15 2142   . LORazepam (ATIVAN) injection 0.5 mg  0.5 mg Intravenous Daily PRN Theodoro Grist, MD      . mirtazapine (REMERON SOL-TAB) disintegrating tablet 15 mg  15 mg Oral QHS Gonzella Lex, MD   15 mg at 12/08/15 2142  . ondansetron (ZOFRAN) tablet 4 mg  4 mg Oral Q6H PRN Hillary Bow, MD       Or  . ondansetron (ZOFRAN) injection 4 mg  4 mg Intravenous Q6H PRN Hillary Bow, MD   4 mg at 12/05/15 0853  . polyethylene glycol (MIRALAX / GLYCOLAX) packet 17 g  17 g Oral Daily PRN Srikar Sudini, MD      . sevelamer carbonate (RENVELA) tablet 2,400 mg  2,400 mg Oral TID WC Hillary Bow, MD   2,400 mg at 12/09/15 [id number]  . sodium chloride flush (NS) 0.9 % injection 3 mL  3 mL Intravenous Q12H Srikar Sudini, MD   3 mL at 12/09/15 1000  . vancomycin (VANCOCIN) IVPB 750 mg/150 ml premix  750 mg Intravenous Q M,W,F-HD Demetrios Loll, MD   750 mg at 12/08/15 1241     Review of Systems A Complete review of systems was not possible secondary to patient's lethargy and sleepiness.  Physical Exam Blood pressure 92/61, pulse 80, temperature 98 F (36.7 C), temperature source Oral, resp. rate 14, height 5\' 9"  (1.753 m), weight 74.68 kg (164 lb 10.2 oz), SpO2 100 %. CONSTITUTIONAL: Resting in bed in no obvious distress. EARS, NOSE, MOUTH AND THROAT: The oropharynx is clear. The oral mucosa is pink and moist. Hearing is intact to voice. RESPIRATORY:  Lungs are clear. There is normal respiratory effort, CARDIOVASCULAR: Heart is regular  GI: The abdomen is soft, and nondistended.  GU: Rectal deferred.   MUSCULOSKELETAL: Normal muscle strength and tone. No cyanosis or edema.   SKIN: Turgor is good and there are no pathologic skin lesions or ulcers. Specifically his entire back was palpated. There is no evidence of raised lesions, erythema, induration, fluctuance. No palpable or visible evidence of abscess to the back NEUROLOGIC: Not assessed PSYCH:  Unable to be assessed secondary to patient's current mental  condition  Data Reviewed Labs and images reviewed. Labs do show a persistent mildly elevated leukocytosis. MRI of the back reviewed which shows a subcentimeter fluid collection lateral to L5. No obvious stranding or signs of inflammation visualized on images I have personally reviewed the patient's imaging, laboratory findings and medical records.    Assessment    MSSA bacteremia    Plan    65 year old dialysis-dependent male with MSSA bacteremia. There is a visualized cysts on MRI of the back however on exam there is no evidence  of outward infection. Patient was somnolent during my visit and thus evaluation for tenderness or pain is very difficult. It is unlikely that this subcentimeter fluid collection is the cause of his bacteremia. His vascular access for his dialysis seems like a much more likely cause. I would not recommend any intervention for this subcentimeter fluid collection at this time. Should his bacteremia not resolve and his vascular access be ruled out as the source could be possible to do an image guided aspiration of the fluid to confirm whether or not it is infected or not. Surgery will not follow at this time please call again if we can be of any further assistance.     Time spent with the patient was 30 minutes, with more than 50% of the time spent in face-to-face education, counseling and care coordination.     Clayburn Pert, MD FACS General Surgeon 12/09/2015, 1:36 PM

## 2015-12-09 NOTE — Progress Notes (Signed)
MD vaickute was notified of lethargic and not wearing mask- ABG was ordered.

## 2015-12-09 NOTE — Plan of Care (Signed)
Problem: Phase I Progression Outcomes Goal: Dyspnea controlled at rest Outcome: Not Progressing No voiced complaints.  Continues to remove nonrebreather mask. Saturations drop to 80's on removal. Continual redirection and education needed to keep oxygen in place.

## 2015-12-09 NOTE — H&P (Signed)
[facility] [place] NAME: Noah Lewis    MR#:  [id number]  DATE OF BIRTH:  Aug 09, 1951  SUBJECTIVE:  CHIEF COMPLAINT:   Chief Complaint  Patient presents with  . Back Pain   Unresponsive to verbal stimuli, not following commands, not responding to palpation of the back, remains on nonrebreather. MRI of the back revealed 8/10 mm fluid collection in the soft tissues at L5 area, no connection with the epidural space or joint. Patient was seen by surgeon, who did not recommend any intervention at present. Surgeon felt that patient's hemodialysis vascular access is likely source of infection. No review of systems possible due to altered mental status  REVIEW OF SYSTEMS:   ROS unobtainable due to poor responsiveness  DRUG ALLERGIES:   Allergies  Allergen Reactions  . Penicillins Other (See Comments)    Reaction:  Unknown     VITALS:  Blood pressure 92/61, pulse 80, temperature 98 F (36.7 C), temperature source Oral, resp. rate 14, height 5\' 9"  (1.753 m), weight 74.68 kg (164 lb 10.2 oz), SpO2 100 %.  PHYSICAL EXAMINATION:  GENERAL:  65 y.o.-year-old patient lying in the bed , snoring, poorly responsive to verbal stimuli or shaking patient is snoring EYES: Pupils equal, round, reactive to light and accommodation. No scleral icterus. Extraocular muscles intact.  HEENT: Head atraumatic, normocephalic. Oropharynx and nasopharynx clear.  NECK:  Supple, no jugular venous distention. No thyroid enlargement, no tenderness.  LUNGS: Bilateral Crackles and rhonchi, snoring, poor air entry intermittently due to obstructive symptoms. CARDIOVASCULAR: S1, S2 normal. No murmurs, rubs, or gallops.  ABDOMEN: Soft, nontender, nondistended. Bowel sounds present. No organomegaly or mass.  EXTREMITIES: No pedal edema, cyanosis, or clubbing.  NEUROLOGIC: Not moving upper or lower extremities. As poorly responsive. Does not follow all  commands. PSYCHIATRIC: The patient is drowsy SKIN: No obvious rash, lesion, or ulcer.    LABORATORY PANEL:   CBC  Recent Labs Lab 12/09/15 0627  WBC 12.6*  HGB 9.8*  HCT 29.8*  PLT 193   ------------------------------------------------------------------------------------------------------------------  Chemistries   Recent Labs Lab 12/04/15 1544  12/06/15 1309  NA 133*  < > 131*  K 6.1*  < > 4.7  CL 97*  < > 93*  CO2 19*  < > 22  GLUCOSE 96  < > 112*  BUN 88*  < > 69*  CREATININE 11.79*  < > 9.94*  CALCIUM 8.5*  < > 7.4*  AST 33  --   --   ALT 24  --   --   ALKPHOS 84  --   --   BILITOT 0.8  --   --   < > = values in this interval not displayed. ------------------------------------------------------------------------------------------------------------------  Cardiac Enzymes  Recent Labs Lab 12/05/15 0422  TROPONINI 0.05*   ------------------------------------------------------------------------------------------------------------------  RADIOLOGY:  Ct Head Wo Contrast  12/08/2015  CLINICAL DATA:  Encephalopathy, shortness of Breath EXAM: CT HEAD WITHOUT CONTRAST TECHNIQUE: Contiguous axial images were obtained from the base of the skull through the vertex without intravenous contrast. COMPARISON:  02/18/2013 FINDINGS: Brain: Mild parenchymal atrophy. Patchy areas of hypoattenuation in deep and periventricular white matter bilaterally. Negative for acute intracranial hemorrhage, mass lesion, acute infarction, midline shift, or mass-effect. Acute infarct may be inapparent on noncontrast CT. Ventricles and sulci symmetric. Vascular: No hyperdense vessel or unexpected calcification. Atherosclerotic and physiologic intracranial calcifications. Skull: Negative for fracture or focal lesion. Sinuses/Orbits: Interval opacification of mastoid air cells bilaterally. Visualized paranasal sinuses  are normally developed and well aerated. Orbits unremarkable. Other: None.  IMPRESSION: 1. Negative for bleed or other acute intracranial process. 2. Atrophy and nonspecific white matter changes. 3. Interval opacification of bilateral mastoid air cells. Electronically Signed   By: Lucrezia Europe M.D.   On: 12/08/2015 16:08   Mr Lumbar Spine Wo Contrast  12/09/2015  CLINICAL DATA:  Bacteremia. Altered mental state. Leukocytosis. Dialysis patient EXAM: MRI LUMBAR SPINE WITHOUT CONTRAST TECHNIQUE: Multiplanar, multisequence MR imaging of the lumbar spine was performed. No intravenous contrast was administered. COMPARISON:  Lumbar radiographs 12/04/2015 FINDINGS: Normal lumbar alignment. Negative for fracture or mass lesion. Conus medullaris normal and terminates at L1. 8 x 10 mm fluid collection to the left of the L5 spinous process. No associated bony abnormality. This could represent a small soft tissue abscess. This does not appear to connect with the facet or disc space. No epidural abscess. No evidence of discitis or osteomyelitis. Unfortunately, the patient cannot have intravenous contrast due to dialysis. L1-2: Mild disc degeneration and mild facet degeneration without spinal stenosis L2-3:  Negative L3-4: Mild disc bulging and facet degeneration without significant spinal stenosis L4-5: Mild disc and facet degeneration. Mild spinal stenosis. No disc protrusion. L5-S1: Moderate to advanced disc degeneration with disc space narrowing. Endplate spurring is present. There is subarticular and foraminal stenosis bilaterally of a moderate degree due to spurring. Mild facet degeneration. IMPRESSION: 8 x 10 mm fluid collection in the soft tissues to the left of the L5 spinous process. This does not appear to connect with the epidural space or facet joint. This may represent a small soft tissue abscess given the history. Alternately, this could represent a synovial cyst of the facet joint or a simple fluid collection. No evidence of discitis or osteomyelitis Lumbar degenerative change as above.  Electronically Signed   By: Franchot Gallo M.D.   On: 12/09/2015 11:12    EKG:   Orders placed or performed during the hospital encounter of 12/04/15  . ED EKG  . ED EKG    ASSESSMENT AND PLAN:   * Acute encephalopathy due to unclear etiology. The ABGs were unremarkable, no CO2 retention Seems to be multifactorial due to depression, acute infection. Ammonia, B12, TSH levels were normal.  CT scan of the head without contrast was negative for acute intracranial process.   * Acute on chronic diastolic CHF with acute hypoxic respiratory failure - on nonrebreather His saturations are in the 50s off the NONREBREATHER. Chest x-ray showed pulmonary edema.  Follow. Chest x-ray after dialysis  * New onset Atrial fibrillation On oral Cardizem, may need to change to intravenous route. If unable to take orally.  * MSSA bacteremia - afebrile Blood cultures drawn at the dialysis center on 12/02/2015. cultures repeated in the hospital on 12/05/2015 Have MSSA. On vancomycin. Appreciate ID input. MRI of the lumbar spine revealed small fluid collection, unlikely abscess per surgery. Patient's blood cultures were repeated on March 17, pending. Has penicillin allergy.  * Hyperkalemia with end-stage renal disease. Improved with Kayexalate, IV insulin and D50 In the emergency room Continue HD. Follow potassium levels closely.  * Hypotension,. Hold labetalol and cardizem. Vasotec held due to hyperkalemia on admission. Following blood pressure readings closely and restart blood pressure medications as tolerated.  * Severe depression Appreciate Dr. Weber Cooks seeing the patient. Now on Remeron  * Chronic low back pain. MRI of lumbar spine did not show any acute abnormalities except of small cyst in paraspinal area, unlikely abscess per surgery,  no intervention was recommended  Due to encephalopathy opiates will be placed on hold   * Diabetes mellitus Sliding scale due to unpredictable oral  intake.  * DVT prophylaxis. Patient is on heparin drip.  I discussed patient's case with multiple family members, patient's mother refuses to be power of attorney for medical care, however, patient's brother stepped up and is willing to take discharge, he is to sign documentation provided for him in the hospital to become power of attorney for medical care All the records are reviewed and case discussed with Care Management/Social Workerr. Management plans discussed with the patient, mother and they are in agreement.  CODE STATUS: full code.  TOTAL CC TIME TAKING CARE OF THIS PATIENT: 60 minutes.  About 20 minutes was spent on discussions with family POSSIBLE D/C IN 3-4 DAYS, DEPENDING ON CLINICAL CONDITION.  Theodoro Grist M.D on 12/09/2015 at 2:31 PM  Between 7am to 6pm - Pager - [phone]  After 6pm go to [email or web address] - password [email or web address] [facility]  Office  [phone]  CC: Primary care physician; Elyse Jarvis, MD

## 2015-12-09 NOTE — Progress Notes (Signed)
Weaned to venti mask. A fib. FS are stable. Lethargic MD Ether Griffins was notified. Takes meds whole in apple sauce. MR Spine was completed and showed a small mass. Surgery consult suggested no interventions. Pt has no further concerns at this time.

## 2015-12-09 NOTE — Progress Notes (Signed)
Subjective:  Patient completed hemodialysis yesterday. He is a bit more awake and alert today. Family members at bedside.  Objective:  Vital signs in last 24 hours:  Temp:  [98.1 F (36.7 C)-98.9 F (37.2 C)] 98.9 F (37.2 C) (03/18 0532) Pulse Rate:  [75-102] 92 (03/18 0715) Resp:  [14-20] 20 (03/18 0715) BP: (100-153)/(69-90) 115/70 mmHg (03/18 0715) SpO2:  [99 %-100 %] 100 % (03/18 0715) Weight:  [73.9 kg (162 lb 14.7 oz)-76.8 kg (169 lb 5 oz)] 74.68 kg (164 lb 10.2 oz) (03/18 0532)  Weight change: 1.004 kg (2 lb 3.4 oz) Filed Weights   12/08/15 1025 12/08/15 1345 12/09/15 0532  Weight: 76.8 kg (169 lb 5 oz) 73.9 kg (162 lb 14.7 oz) 74.68 kg (164 lb 10.2 oz)    Intake/Output:    Intake/Output Summary (Last 24 hours) at 12/09/15 1019 Last data filed at 12/09/15 0944  Gross per 24 hour  Intake    120 ml  Output   2300 ml  Net  -2180 ml     Physical Exam: General: Chronically ill-appearing, laying in the bed   HEENT Moist oral mucous membranes   Neck Supple  Pulm/lungs Normal respiratory effort,  Scattered rhonchi  CVS/Heart Irregular rhythm   Abdomen:  Soft, nontender, BS present  Extremities:  + Dependent edema   Neurologic: Awake, will follow simple commands  Skin: No acute rashes   Access: Left arm AV fistula        Basic Metabolic Panel:   Recent Labs Lab 12/04/15 1544 12/04/15 2240 12/05/15 0422 12/05/15 1507 12/06/15 1309  NA 133*  --  132* 133* 131*  K 6.1* 5.0 4.7 3.8 4.7  CL 97*  --  98* 95* 93*  CO2 19*  --  16* 26 22  GLUCOSE 96  --  126* 107* 112*  BUN 88*  --  97* 63* 69*  CREATININE 11.79*  --  12.44* 8.39* 9.94*  CALCIUM 8.5*  --  8.0* 8.1* 7.4*  PHOS  --   --   --  3.6  --      CBC:  Recent Labs Lab 12/06/15 0245 12/06/15 1309 12/07/15 0831 12/08/15 0252 12/09/15 0627  WBC 8.4 11.0* 12.9* 13.6* 12.6*  HGB 10.3* 9.9* 10.0* 9.6* 9.8*  HCT 31.1* 30.3* 30.3* 29.0* 29.8*  MCV 86.4 86.2 84.8 84.5 85.4  PLT 126* 143* 145*  181 193      Microbiology:  Recent Results (from the past 720 hour(s))  MRSA PCR Screening     Status: None   Collection Time: 12/04/15 10:40 PM  Result Value Ref Range Status   MRSA by PCR NEGATIVE NEGATIVE Final    Comment:        The GeneXpert MRSA Assay (FDA approved for NASAL specimens only), is one component of a comprehensive MRSA colonization surveillance program. It is not intended to diagnose MRSA infection nor to guide or monitor treatment for MRSA infections.   CULTURE, BLOOD (ROUTINE X 2) w Reflex to PCR ID Panel     Status: None (Preliminary result)   Collection Time: 12/05/15 10:00 PM  Result Value Ref Range Status   Specimen Description BLOOD RIGHT ANTECUBITAL  Final   Special Requests BOTTLES DRAWN AEROBIC AND ANAEROBIC 5ML  Final   Culture  Setup Time   Final    GRAM POSITIVE COCCI ANAEROBIC BOTTLE ONLY CRITICAL RESULT CALLED TO, READ BACK BY AND VERIFIED WITH: MELISSA Van Horn 12/07/15 1458    Culture STAPHYLOCOCCUS AUREUS ANAEROBIC BOTTLE ONLY  Final   Report Status PENDING  Incomplete   Organism ID, Bacteria STAPHYLOCOCCUS AUREUS  Final      Susceptibility   Staphylococcus aureus - MIC*    CIPROFLOXACIN <=0.5 SENSITIVE Sensitive     ERYTHROMYCIN 4 RESISTANT Resistant     GENTAMICIN <=0.5 SENSITIVE Sensitive     OXACILLIN <=0.25 SENSITIVE Sensitive     TETRACYCLINE <=1 SENSITIVE Sensitive     VANCOMYCIN 1 SENSITIVE Sensitive     TRIMETH/SULFA <=10 SENSITIVE Sensitive     CLINDAMYCIN RESISTANT Resistant     RIFAMPIN <=0.5 SENSITIVE Sensitive     Inducible Clindamycin Value in next row Resistant      POSITIVEINDUCIBLE CLINDAMYCIN RESISTANCE - A positive ICR test is indicative of inducible resistance to macrolides, lincosamides, and type B streptogramin.  This isolate is presumed to be resistant to Clindamycin, however, Clindamycin may still be effective in some patients.     * STAPHYLOCOCCUS AUREUS  Blood Culture ID Panel (Reflexed)     Status:  Abnormal   Collection Time: 12/05/15 10:00 PM  Result Value Ref Range Status   Enterococcus species NOT DETECTED NOT DETECTED Final   Vancomycin resistance NOT DETECTED NOT DETECTED Final   Listeria monocytogenes NOT DETECTED NOT DETECTED Final   Staphylococcus species DETECTED (A) NOT DETECTED Final    Comment: CRITICAL RESULT CALLED TO, READ BACK BY AND VERIFIED WITH: MELISSA MECCIA 12/07/15 1458 MLM    Staphylococcus aureus DETECTED (A) NOT DETECTED Final    Comment: CRITICAL RESULT CALLED TO, READ BACK BY AND VERIFIED WITH: MELISSA MECCIA 12/07/15 1458 MLM    Methicillin resistance NOT DETECTED NOT DETECTED Final   Streptococcus species NOT DETECTED NOT DETECTED Final   Streptococcus agalactiae NOT DETECTED NOT DETECTED Final   Streptococcus pneumoniae NOT DETECTED NOT DETECTED Final   Streptococcus pyogenes NOT DETECTED NOT DETECTED Final   Acinetobacter baumannii NOT DETECTED NOT DETECTED Final   Enterobacteriaceae species NOT DETECTED NOT DETECTED Final   Enterobacter cloacae complex NOT DETECTED NOT DETECTED Final   Escherichia coli NOT DETECTED NOT DETECTED Final   Klebsiella oxytoca NOT DETECTED NOT DETECTED Final   Klebsiella pneumoniae NOT DETECTED NOT DETECTED Final   Proteus species NOT DETECTED NOT DETECTED Final   Serratia marcescens NOT DETECTED NOT DETECTED Final   Carbapenem resistance NOT DETECTED NOT DETECTED Final   Haemophilus influenzae NOT DETECTED NOT DETECTED Final   Neisseria meningitidis NOT DETECTED NOT DETECTED Final   Pseudomonas aeruginosa NOT DETECTED NOT DETECTED Final   Candida albicans NOT DETECTED NOT DETECTED Final   Candida glabrata NOT DETECTED NOT DETECTED Final   Candida krusei NOT DETECTED NOT DETECTED Final   Candida parapsilosis NOT DETECTED NOT DETECTED Final   Candida tropicalis NOT DETECTED NOT DETECTED Final  CULTURE, BLOOD (ROUTINE X 2) w Reflex to PCR ID Panel     Status: None (Preliminary result)   Collection Time: 12/05/15 10:05  PM  Result Value Ref Range Status   Specimen Description BLOOD BLOOD RIGHT FOREARM  Final   Special Requests BOTTLES DRAWN AEROBIC AND ANAEROBIC 5ML  Final   Culture NO GROWTH 4 DAYS  Final   Report Status PENDING  Incomplete  CULTURE, BLOOD (ROUTINE X 2) w Reflex to PCR ID Panel     Status: None (Preliminary result)   Collection Time: 12/08/15  2:52 PM  Result Value Ref Range Status   Specimen Description BLOOD RIGHT HAND  Final   Special Requests   Final    BOTTLES DRAWN AEROBIC AND  ANAEROBIC 8CC AERO 6CC ANA   Culture NO GROWTH < 24 HOURS  Final   Report Status PENDING  Incomplete  CULTURE, BLOOD (ROUTINE X 2) w Reflex to PCR ID Panel     Status: None (Preliminary result)   Collection Time: 12/08/15  6:19 PM  Result Value Ref Range Status   Specimen Description BLOOD RIGHT WRIST  Final   Special Requests   Final    BOTTLES DRAWN AEROBIC AND ANAEROBIC 11CCAERO,8CCANA   Culture NO GROWTH < 12 HOURS  Final   Report Status PENDING  Incomplete    Coagulation Studies:  Recent Labs  12/06/15 1309 12/07/15 1013 12/08/15 0252  LABPROT 15.5* 15.6* 15.2*  INR 1.21 1.22 1.18    Urinalysis: No results for input(s): COLORURINE, LABSPEC, PHURINE, GLUCOSEU, HGBUR, BILIRUBINUR, KETONESUR, PROTEINUR, UROBILINOGEN, NITRITE, LEUKOCYTESUR in the last 72 hours.  Invalid input(s): APPERANCEUR    Imaging: Ct Head Wo Contrast  12/08/2015  CLINICAL DATA:  Encephalopathy, shortness of Breath EXAM: CT HEAD WITHOUT CONTRAST TECHNIQUE: Contiguous axial images were obtained from the base of the skull through the vertex without intravenous contrast. COMPARISON:  02/18/2013 FINDINGS: Brain: Mild parenchymal atrophy. Patchy areas of hypoattenuation in deep and periventricular white matter bilaterally. Negative for acute intracranial hemorrhage, mass lesion, acute infarction, midline shift, or mass-effect. Acute infarct may be inapparent on noncontrast CT. Ventricles and sulci symmetric. Vascular: No  hyperdense vessel or unexpected calcification. Atherosclerotic and physiologic intracranial calcifications. Skull: Negative for fracture or focal lesion. Sinuses/Orbits: Interval opacification of mastoid air cells bilaterally. Visualized paranasal sinuses are normally developed and well aerated. Orbits unremarkable. Other: None. IMPRESSION: 1. Negative for bleed or other acute intracranial process. 2. Atrophy and nonspecific white matter changes. 3. Interval opacification of bilateral mastoid air cells. Electronically Signed   By: Lucrezia Europe M.D.   On: 12/08/2015 16:08     Medications:     . antiseptic oral rinse  7 mL Mouth Rinse q12n4p  . aspirin EC  325 mg Oral Daily  . chlorhexidine  15 mL Mouth Rinse BID  . diltiazem  90 mg Oral Q12H  . epoetin (EPOGEN/PROCRIT) injection  4,000 Units Intravenous Q M,W,F-HD  . feeding supplement (NEPRO CARB STEADY)  237 mL Oral Q24H  . haloperidol lactate  2 mg Intramuscular Once  . insulin aspart  0-5 Units Subcutaneous QHS  . insulin aspart  0-9 Units Subcutaneous TID WC  . labetalol  100 mg Oral BID  . latanoprost  1 drop Both Eyes QHS  . mirtazapine  15 mg Oral QHS  . sevelamer carbonate  2,400 mg Oral TID WC  . sodium chloride flush  3 mL Intravenous Q12H  . vancomycin  750 mg Intravenous Q M,W,F-HD   sodium chloride, acetaminophen **OR** acetaminophen, HYDROcodone-acetaminophen, ipratropium-albuterol, labetalol, LORazepam, ondansetron **OR** ondansetron (ZOFRAN) IV, polyethylene glycol  Assessment/ Plan:  65 y.o. male with end-stage renal disease, diabetes, hypertension [employer]/M-W-F/ [facility]  1. End-stage renal disease with hyperkalemia - Patient had hemodialysis yesterday. No acute indication for dialysis today. We will plan for dialysis again on Monday.   2. Atrial fibrillation - patient currently on oral diltiazem.  3. Anemia of chronic kidney disease - continue Epogen 4000 units IV with dialysis, hemoglobin currently  9.8.  4. SHPTH - continue renvela at its current doseage, most recent phosphorus was 3.6.     LOS: 5 Noah Lewis 3/18/201710:19 AM

## 2015-12-09 NOTE — Progress Notes (Addendum)
Psychiatry f/u: Came to evaluate pt twice.  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  Second time pt sleeping -unable to arouse.  Will try again tomorrow

## 2015-12-09 NOTE — Progress Notes (Signed)
Nutrition Follow-up     INTERVENTION:   Meals and Snacks: Cater to patient preferences, diet already liberalized to Regular. Calorie Count in progress. Family may bring in outside food for pt if he prefers, discussed with Myra Gianotti, RN to relay to pt and family Medical Food Supplement Therapy: pt does not like Nepro per nsg; refused yesterday, did not drink today. Recommend trying Mighty Shakes and Magic Cup supplements; will send on meal trays  NUTRITION DIAGNOSIS:   Inadequate oral intake related to acute illness as evidenced by meal completion < 25%.  GOAL:   Patient will meet greater than or equal to 90% of their needs  MONITOR:   PO intake, Supplement acceptance, Labs, Weight trends, I & O's  REASON FOR ASSESSMENT:    (Dialysis pt)    ASSESSMENT:    Npted palliative care following, calorie count ordered by MD yesterday, started this AM. Pt received HD yesterday, next scheduled for Monday. Per Junious Dresser RN, pt not cooperative this AM, refusing to wear nonrebreather mask, throwing meds at RN, did not eat breakfast. Down for MRI on visit today  Diet Order:  Diet regular Room service appropriate?: Yes with Assist; Fluid consistency:: Thin   Energy Intake: calorie count in progress, diet liberalized to regular yesterday; pt refused breakfast tray this AM per Myra Gianotti. Recorded po intake 0% on 3/16, no other intake recorded   Recent Labs Lab 12/05/15 0422 12/05/15 1507 12/06/15 1309  NA 132* 133* 131*  K 4.7 3.8 4.7  CL 98* 95* 93*  CO2 16* 26 22  BUN 97* 63* 69*  CREATININE 12.44* 8.39* 9.94*  CALCIUM 8.0* 8.1* 7.4*  PHOS  --  3.6  --   GLUCOSE 126* 107* 112*    Glucose Profile:  Recent Labs  12/08/15 2113 12/09/15 0736 12/09/15 1132  GLUCAP 84 102* 115*   Meds: ss novolog, remeron, renvela  Height:   Ht Readings from Last 1 Encounters:  12/07/15 5\' 9"  (1.753 m)    Weight:   Wt Readings from Last 1 Encounters:  12/09/15 164 lb 10.2 oz (74.68 kg)     Filed Weights   12/08/15 1025 12/08/15 1345 12/09/15 0532  Weight: 169 lb 5 oz (76.8 kg) 162 lb 14.7 oz (73.9 kg) 164 lb 10.2 oz (74.68 kg)    BMI:  Body mass index is 24.3 kg/(m^2).  Estimated Nutritional Needs:   Kcal:  BEE: 1550kcals, TEE: (IF 1.1-1.3)(AF 1.2) [id number]  Protein:  93-116g protein (1.2-1.5g/kg)  Fluid:  UOP+1L  EDUCATION NEEDS:   Education needs no appropriate at this time  [place], [place], LDN [phone] Pager  [phone] Weekend/On-Call Pager

## 2015-12-10 ENCOUNTER — Inpatient Hospital Stay: Payer: Medicaid Other

## 2015-12-10 LAB — BASIC METABOLIC PANEL
Anion gap: 15 (ref 5–15)
BUN: 66 mg/dL — AB (ref 6–20)
CHLORIDE: 94 mmol/L — AB (ref 101–111)
CO2: 21 mmol/L — ABNORMAL LOW (ref 22–32)
Calcium: 7.5 mg/dL — ABNORMAL LOW (ref 8.9–10.3)
Creatinine, Ser: 9.71 mg/dL — ABNORMAL HIGH (ref 0.61–1.24)
GFR calc Af Amer: 6 mL/min — ABNORMAL LOW (ref >60)
GFR, EST NON AFRICAN AMERICAN: 5 mL/min — AB (ref >60)
GLUCOSE: 84 mg/dL (ref 65–99)
POTASSIUM: 4.6 mmol/L (ref 3.5–5.1)
Sodium: 130 mmol/L — ABNORMAL LOW (ref 135–145)

## 2015-12-10 LAB — CULTURE, BLOOD (ROUTINE X 2): CULTURE: NO GROWTH

## 2015-12-10 LAB — GLUCOSE, CAPILLARY
GLUCOSE-CAPILLARY: 118 mg/dL — AB (ref 65–99)
GLUCOSE-CAPILLARY: 106 mg/dL — AB (ref 65–99)
GLUCOSE-CAPILLARY: 89 mg/dL (ref 65–99)
Glucose-Capillary: 90 mg/dL (ref 65–99)

## 2015-12-10 LAB — CBC
HEMATOCRIT: 29.2 % — AB (ref 40.0–52.0)
Hemoglobin: 9.7 g/dL — ABNORMAL LOW (ref 13.0–18.0)
MCH: 28.7 pg (ref 26.0–34.0)
MCHC: 33.3 g/dL (ref 32.0–36.0)
MCV: 86 fL (ref 80.0–100.0)
Platelets: 199 10*3/uL (ref 150–440)
RBC: 3.39 MIL/uL — ABNORMAL LOW (ref 4.40–5.90)
RDW: 17.2 % — AB (ref 11.5–14.5)
WBC: 14.6 10*3/uL — ABNORMAL HIGH (ref 3.8–10.6)

## 2015-12-10 LAB — MAGNESIUM: Magnesium: 2.6 mg/dL — ABNORMAL HIGH (ref 1.7–2.4)

## 2015-12-10 MED ORDER — HEPARIN SODIUM (PORCINE) 5000 UNIT/ML IJ SOLN
5000.0000 [IU] | Freq: Three times a day (TID) | INTRAMUSCULAR | Status: DC
  Administered 2015-12-10 – 2015-12-14 (×7): 5000 [IU] via SUBCUTANEOUS
  Filled 2015-12-10 (×9): qty 1

## 2015-12-10 NOTE — Progress Notes (Signed)
MD [place] notified of 12 beat run of vtach. Order was to check mag stat.

## 2015-12-10 NOTE — H&P (Signed)
[facility] [place] NAME: Noah Lewis    MR#:  [id number]  DATE OF BIRTH:  03-04-1951  SUBJECTIVE:  CHIEF COMPLAINT:   Chief Complaint  Patient presents with  . Back Pain   Minimally responsive to verbal stimuli, not following commands, not responding to palpation of the back, weaned off nonrebreather, now on nasal cannulas. Not able to obtain the review of systems. MRI of the back revealed 8/10 mm fluid collection in the soft tissues at L5 area, no connection with the epidural space or joint. Patient was seen by surgeon, who did not recommend any intervention at present. Surgeon felt that patient's hemodialysis vascular access is likely source of infection. No review of systems possible due to altered mental status. Elevated but more alert today, opening his eyes with painful stimuli and shaking, nonverbal  REVIEW OF SYSTEMS:   ROS unobtainable due to poor responsiveness  DRUG ALLERGIES:   Allergies  Allergen Reactions  . Penicillins Other (See Comments)    Reaction:  Unknown     VITALS:  Blood pressure 104/59, pulse 83, temperature 97.5 F (36.4 C), temperature source Axillary, resp. rate 24, height 5\' 9"  (1.753 m), weight 74.118 kg (163 lb 6.4 oz), SpO2 95 %.  PHYSICAL EXAMINATION:  GENERAL:  65 y.o.-year-old patient lying in the bed , snoring, poorly responsive to verbal stimuli or shaking, but able to briefly open his eyes, nonverbal, snoring EYES: Pupils equal, round, reactive to light and accommodation. No scleral icterus. Extraocular muscles intact.  HEENT: Head atraumatic, normocephalic. Oropharynx and nasopharynx clear.  NECK:  Supple, no jugular venous distention. No thyroid enlargement, no tenderness.  LUNGS: Bilateral rhonchi, snoring, poor air entry intermittently due to obstructive symptoms. CARDIOVASCULAR: S1, S2 normal. No murmurs, rubs, or gallops.  ABDOMEN: Soft, nontender, nondistended. Bowel sounds  present. No organomegaly or mass.  EXTREMITIES: No pedal edema, cyanosis, or clubbing.  NEUROLOGIC: Not moving upper or lower extremities. As poorly responsive. Does not follow commands. PSYCHIATRIC: The patient is drowsy SKIN: No obvious rash, lesion, or ulcer.    LABORATORY PANEL:   CBC  Recent Labs Lab 12/10/15 0702  WBC 14.6*  HGB 9.7*  HCT 29.2*  PLT 199   ------------------------------------------------------------------------------------------------------------------  Chemistries   Recent Labs Lab 12/04/15 1544  12/10/15 0702  NA 133*  < > 130*  K 6.1*  < > 4.6  CL 97*  < > 94*  CO2 19*  < > 21*  GLUCOSE 96  < > 84  BUN 88*  < > 66*  CREATININE 11.79*  < > 9.71*  CALCIUM 8.5*  < > 7.5*  MG  --   --  2.6*  AST 33  --   --   ALT 24  --   --   ALKPHOS 84  --   --   BILITOT 0.8  --   --   < > = values in this interval not displayed. ------------------------------------------------------------------------------------------------------------------  Cardiac Enzymes  Recent Labs Lab 12/05/15 0422  TROPONINI 0.05*   ------------------------------------------------------------------------------------------------------------------  RADIOLOGY:  Mr Brain Wo Contrast  12/10/2015  CLINICAL DATA:  Altered mental status. EXAM: MRI HEAD WITHOUT CONTRAST TECHNIQUE: Multiplanar, multiecho pulse sequences of the brain and surrounding structures were obtained without intravenous contrast. COMPARISON:  CT head 12/08/2015 FINDINGS: Image quality degraded by moderate motion Moderate atrophy.  Negative for hydrocephalus Negative for acute infarct. Mild chronic microvascular ischemic change in the white matter. Brainstem intact. Cerebellum and basal ganglia intact.  Negative for hemorrhage or mass. No shift of the midline structures. Normal skull base. Minimal mucosal edema paranasal sinuses. Extensive mastoid effusion bilaterally. IMPRESSION: Image quality degraded by moderate  motion. No acute infarct.  Mild chronic microvascular ischemic changes. Bilateral mastoid sinus effusion. Electronically Signed   By: Franchot Gallo M.D.   On: 12/10/2015 15:52   Mr Lumbar Spine Wo Contrast  12/09/2015  CLINICAL DATA:  Bacteremia. Altered mental state. Leukocytosis. Dialysis patient EXAM: MRI LUMBAR SPINE WITHOUT CONTRAST TECHNIQUE: Multiplanar, multisequence MR imaging of the lumbar spine was performed. No intravenous contrast was administered. COMPARISON:  Lumbar radiographs 12/04/2015 FINDINGS: Normal lumbar alignment. Negative for fracture or mass lesion. Conus medullaris normal and terminates at L1. 8 x 10 mm fluid collection to the left of the L5 spinous process. No associated bony abnormality. This could represent a small soft tissue abscess. This does not appear to connect with the facet or disc space. No epidural abscess. No evidence of discitis or osteomyelitis. Unfortunately, the patient cannot have intravenous contrast due to dialysis. L1-2: Mild disc degeneration and mild facet degeneration without spinal stenosis L2-3:  Negative L3-4: Mild disc bulging and facet degeneration without significant spinal stenosis L4-5: Mild disc and facet degeneration. Mild spinal stenosis. No disc protrusion. L5-S1: Moderate to advanced disc degeneration with disc space narrowing. Endplate spurring is present. There is subarticular and foraminal stenosis bilaterally of a moderate degree due to spurring. Mild facet degeneration. IMPRESSION: 8 x 10 mm fluid collection in the soft tissues to the left of the L5 spinous process. This does not appear to connect with the epidural space or facet joint. This may represent a small soft tissue abscess given the history. Alternately, this could represent a synovial cyst of the facet joint or a simple fluid collection. No evidence of discitis or osteomyelitis Lumbar degenerative change as above. Electronically Signed   By: Franchot Gallo M.D.   On: 12/09/2015  11:12   Dg Chest Port 1 View  12/10/2015  CLINICAL DATA:  Dyspnea EXAM: PORTABLE CHEST 1 VIEW COMPARISON:  12/06/2015 chest radiograph. FINDINGS: Vascular stent overlies the left upper mediastinum. Stable cardiomediastinal silhouette with mild cardiomegaly. No pneumothorax. Mild hazy parahilar opacities appear slightly increased. Small left pleural effusion appears increased. Left basilar atelectasis appears increased. IMPRESSION: 1. Cardiomegaly with worsened hazy parahilar opacities, favor worsened mild congestive heart failure. 2. Increased small left pleural effusion with increased left basilar atelectasis. Electronically Signed   By: Ilona Sorrel M.D.   On: 12/10/2015 08:52    EKG:   Orders placed or performed during the hospital encounter of 12/04/15  . ED EKG  . ED EKG    ASSESSMENT AND PLAN:   * Acute encephalopathy due to unclear etiology. The ABGs were unremarkable, no CO2 retention, ammonia level is unremarkable Seems to be multifactorial due to depression, acute infection. Ammonia, B12, TSH levels were normal.  CT scan of the head without contrast was negative for acute intracranial process. Getting neurologist and awaiting for psychiatry's consultation as well. Obtaining palliative care input as well. MRI of brain showed no acute infarct, chronic microvascular ischemic changes.    * Acute on chronic diastolic CHF with acute hypoxic respiratory failure - off  Nonrebreather, on nasal cannulas, at 6 L. His saturations are in the 50s off the NONREBREATHER. Chest x-ray showed pulmonary edema.   Chest x-ray today revealed cardiomegaly, worsened perihilar opacities, likely worsening congestive heart failure, increased pleural effusion on the left. Patient is being continued on dialysis  *  New onset Atrial fibrillation Of oral Cardizem due to hypotension.  Heart rate remained stable in 80s   * MSSA bacteremia - afebrile Blood cultures drawn at the dialysis center on  12/02/2015. cultures repeated in the hospital on 12/05/2015 Have MSSA. Being continued on vancomycin intravenously. Appreciate ID input. MRI of the lumbar spine revealed small fluid collection, unlikely abscess per surgery. Patient's blood cultures were repeated on March 17, no growth so far over the past 2 days. Has penicillin allergy.  * Hyperkalemia with end-stage renal disease. Improved with Kayexalate, IV insulin and D50 In the emergency room Continue HD. Follow potassium levels closely.  * Hypotension,. Holding labetalol and cardizem. Vasotec held due to hyperkalemia on admission. Stable blood pressure readings at present and restart blood pressure medications as tolerated.  * Severe depression Appreciate Dr. Weber Cooks seeing the patient. Now on Remeron.   * Chronic low back pain. MRI of lumbar spine did not show any acute abnormalities except of small cyst in paraspinal area, unlikely abscess per surgery, no intervention was recommended  Due to encephalopathy opiates are placed on hold   * Diabetes mellitus Sliding scale due to unpredictable oral intake.  * DVT prophylaxis. Patient is on heparin subcutaneously.  I discussed patient's case with multiple family members yesterday, patient's mother refuses to be power of attorney for medical care, however, patient's brother stepped up and is willing to take discharge, he is to sign documentation provided for him in the hospital to become power of attorney for medical care All the records are reviewed and case discussed with Care Management/Social Workerr. Management plans discussed with the patient, mother and they are in agreement.  CODE STATUS: full code.  TOTAL CC TIME TAKING CARE OF THIS PATIENT: 30 minutes.   POSSIBLE D/C IN 3-4 DAYS, DEPENDING ON CLINICAL CONDITION.  Theodoro Grist M.D on 12/10/2015 at 4:43 PM  Between 7am to 6pm - Pager - [phone]  After 6pm go to [email or web address] - password [email or web address] [facility]  Office  [phone]  CC: Primary care physician; Elyse Jarvis, MD

## 2015-12-10 NOTE — Progress Notes (Signed)
Lethargic. 6 L of oxygen. A fib. FS are stable. Pt has not showed any signs of pain. Pt had a 12 beat run of vtach and mg was checked mg was 2.6. Pt has no further concerns at this time.

## 2015-12-10 NOTE — Progress Notes (Signed)
Subjective:  Patient still quite lethargic. He is arousable but not consistently following commands.  Due for HD again on Monday.  Objective:  Vital signs in last 24 hours:  Temp:  [97.5 F (36.4 C)-98.2 F (36.8 C)] 97.5 F (36.4 C) (03/19 1142) Pulse Rate:  [83-89] 83 (03/19 1142) Resp:  [20-24] 24 (03/19 0424) BP: (104-110)/(50-86) 104/59 mmHg (03/19 1142) SpO2:  [94 %-100 %] 95 % (03/19 1142) Weight:  [74.118 kg (163 lb 6.4 oz)] 74.118 kg (163 lb 6.4 oz) (03/19 0424)  Weight change: -2.682 kg (-5 lb 14.6 oz) Filed Weights   12/08/15 1345 12/09/15 0532 12/10/15 0424  Weight: 73.9 kg (162 lb 14.7 oz) 74.68 kg (164 lb 10.2 oz) 74.118 kg (163 lb 6.4 oz)    Intake/Output:    Intake/Output Summary (Last 24 hours) at 12/10/15 1659 Last data filed at 12/10/15 1401  Gross per 24 hour  Intake    240 ml  Output      0 ml  Net    240 ml     Physical Exam: General: Chronically ill-appearing, laying in the bed   HEENT Moist oral mucous membranes   Neck Supple  Pulm/lungs Normal respiratory effort,  Scattered rhonchi  CVS/Heart Irregular rhythm   Abdomen:  Soft, nontender, BS present  Extremities:  + Dependent edema   Neurologic: Difficult to arouse, not following commands.  Skin: No acute rashes   Access: Left arm AV fistula        Basic Metabolic Panel:   Recent Labs Lab 12/04/15 1544 12/04/15 2240 12/05/15 0422 12/05/15 1507 12/06/15 1309 12/10/15 0702  NA 133*  --  132* 133* 131* 130*  K 6.1* 5.0 4.7 3.8 4.7 4.6  CL 97*  --  98* 95* 93* 94*  CO2 19*  --  16* 26 22 21*  GLUCOSE 96  --  126* 107* 112* 84  BUN 88*  --  97* 63* 69* 66*  CREATININE 11.79*  --  12.44* 8.39* 9.94* 9.71*  CALCIUM 8.5*  --  8.0* 8.1* 7.4* 7.5*  MG  --   --   --   --   --  2.6*  PHOS  --   --   --  3.6  --   --      CBC:  Recent Labs Lab 12/06/15 1309 12/07/15 0831 12/08/15 0252 12/09/15 0627 12/10/15 0702  WBC 11.0* 12.9* 13.6* 12.6* 14.6*  HGB 9.9* 10.0* 9.6* 9.8*  9.7*  HCT 30.3* 30.3* 29.0* 29.8* 29.2*  MCV 86.2 84.8 84.5 85.4 86.0  PLT 143* 145* 181 193 199      Microbiology:  Recent Results (from the past 720 hour(s))  MRSA PCR Screening     Status: None   Collection Time: 12/04/15 10:40 PM  Result Value Ref Range Status   MRSA by PCR NEGATIVE NEGATIVE Final    Comment:        The GeneXpert MRSA Assay (FDA approved for NASAL specimens only), is one component of a comprehensive MRSA colonization surveillance program. It is not intended to diagnose MRSA infection nor to guide or monitor treatment for MRSA infections.   CULTURE, BLOOD (ROUTINE X 2) w Reflex to PCR ID Panel     Status: None (Preliminary result)   Collection Time: 12/05/15 10:00 PM  Result Value Ref Range Status   Specimen Description BLOOD RIGHT ANTECUBITAL  Final   Special Requests BOTTLES DRAWN AEROBIC AND ANAEROBIC 5ML  Final   Culture  Setup Time  Final    GRAM POSITIVE COCCI ANAEROBIC BOTTLE ONLY CRITICAL RESULT CALLED TO, READ BACK BY AND VERIFIED WITH: MELISSA [place]land 12/07/15 1458    Culture   Final    STAPHYLOCOCCUS AUREUS ANAEROBIC BOTTLE ONLY SUSCEPTIBILITIES TO FOLLOW    Report Status PENDING  Incomplete   Organism ID, Bacteria STAPHYLOCOCCUS AUREUS  Final      Susceptibility   Staphylococcus aureus - MIC*    CIPROFLOXACIN <=0.5 SENSITIVE Sensitive     ERYTHROMYCIN 4 RESISTANT Resistant     GENTAMICIN <=0.5 SENSITIVE Sensitive     OXACILLIN <=0.25 SENSITIVE Sensitive     TETRACYCLINE <=1 SENSITIVE Sensitive     VANCOMYCIN 1 SENSITIVE Sensitive     TRIMETH/SULFA <=10 SENSITIVE Sensitive     CLINDAMYCIN RESISTANT Resistant     RIFAMPIN <=0.5 SENSITIVE Sensitive     Inducible Clindamycin Value in next row Resistant      POSITIVEINDUCIBLE CLINDAMYCIN RESISTANCE - A positive ICR test is indicative of inducible resistance to macrolides, lincosamides, and type B streptogramin.  This isolate is presumed to be resistant to Clindamycin, however,  Clindamycin may still be effective in some patients.     * STAPHYLOCOCCUS AUREUS  Blood Culture ID Panel (Reflexed)     Status: Abnormal   Collection Time: 12/05/15 10:00 PM  Result Value Ref Range Status   Enterococcus species NOT DETECTED NOT DETECTED Final   Vancomycin resistance NOT DETECTED NOT DETECTED Final   Listeria monocytogenes NOT DETECTED NOT DETECTED Final   Staphylococcus species DETECTED (A) NOT DETECTED Final    Comment: CRITICAL RESULT CALLED TO, READ BACK BY AND VERIFIED WITH: MELISSA MECCIA 12/07/15 1458 MLM    Staphylococcus aureus DETECTED (A) NOT DETECTED Final    Comment: CRITICAL RESULT CALLED TO, READ BACK BY AND VERIFIED WITH: MELISSA MECCIA 12/07/15 1458 MLM    Methicillin resistance NOT DETECTED NOT DETECTED Final   Streptococcus species NOT DETECTED NOT DETECTED Final   Streptococcus agalactiae NOT DETECTED NOT DETECTED Final   Streptococcus pneumoniae NOT DETECTED NOT DETECTED Final   Streptococcus pyogenes NOT DETECTED NOT DETECTED Final   Acinetobacter baumannii NOT DETECTED NOT DETECTED Final   Enterobacteriaceae species NOT DETECTED NOT DETECTED Final   Enterobacter cloacae complex NOT DETECTED NOT DETECTED Final   Escherichia coli NOT DETECTED NOT DETECTED Final   Klebsiella oxytoca NOT DETECTED NOT DETECTED Final   Klebsiella pneumoniae NOT DETECTED NOT DETECTED Final   Proteus species NOT DETECTED NOT DETECTED Final   Serratia marcescens NOT DETECTED NOT DETECTED Final   Carbapenem resistance NOT DETECTED NOT DETECTED Final   Haemophilus influenzae NOT DETECTED NOT DETECTED Final   Neisseria meningitidis NOT DETECTED NOT DETECTED Final   Pseudomonas aeruginosa NOT DETECTED NOT DETECTED Final   Candida albicans NOT DETECTED NOT DETECTED Final   Candida glabrata NOT DETECTED NOT DETECTED Final   Candida krusei NOT DETECTED NOT DETECTED Final   Candida parapsilosis NOT DETECTED NOT DETECTED Final   Candida tropicalis NOT DETECTED NOT DETECTED  Final  CULTURE, BLOOD (ROUTINE X 2) w Reflex to PCR ID Panel     Status: None   Collection Time: 12/05/15 10:05 PM  Result Value Ref Range Status   Specimen Description BLOOD BLOOD RIGHT FOREARM  Final   Special Requests BOTTLES DRAWN AEROBIC AND ANAEROBIC 5ML  Final   Culture NO GROWTH 5 DAYS  Final   Report Status 12/10/2015 FINAL  Final  CULTURE, BLOOD (ROUTINE X 2) w Reflex to PCR ID Panel  Status: None (Preliminary result)   Collection Time: 12/08/15  2:52 PM  Result Value Ref Range Status   Specimen Description BLOOD RIGHT HAND  Final   Special Requests   Final    BOTTLES DRAWN AEROBIC AND ANAEROBIC 8CC AERO 6CC ANA   Culture NO GROWTH 2 DAYS  Final   Report Status PENDING  Incomplete  CULTURE, BLOOD (ROUTINE X 2) w Reflex to PCR ID Panel     Status: None (Preliminary result)   Collection Time: 12/08/15  6:19 PM  Result Value Ref Range Status   Specimen Description BLOOD RIGHT WRIST  Final   Special Requests   Final    BOTTLES DRAWN AEROBIC AND ANAEROBIC [place]   Culture NO GROWTH 2 DAYS  Final   Report Status PENDING  Incomplete    Coagulation Studies:  Recent Labs  12/08/15 0252  LABPROT 15.2*  INR 1.18    Urinalysis: No results for input(s): COLORURINE, LABSPEC, PHURINE, GLUCOSEU, HGBUR, BILIRUBINUR, KETONESUR, PROTEINUR, UROBILINOGEN, NITRITE, LEUKOCYTESUR in the last 72 hours.  Invalid input(s): APPERANCEUR    Imaging: Mr Brain Wo Contrast  12/10/2015  CLINICAL DATA:  Altered mental status. EXAM: MRI HEAD WITHOUT CONTRAST TECHNIQUE: Multiplanar, multiecho pulse sequences of the brain and surrounding structures were obtained without intravenous contrast. COMPARISON:  CT head 12/08/2015 FINDINGS: Image quality degraded by moderate motion Moderate atrophy.  Negative for hydrocephalus Negative for acute infarct. Mild chronic microvascular ischemic change in the white matter. Brainstem intact. Cerebellum and basal ganglia intact. Negative for hemorrhage or  mass. No shift of the midline structures. Normal skull base. Minimal mucosal edema paranasal sinuses. Extensive mastoid effusion bilaterally. IMPRESSION: Image quality degraded by moderate motion. No acute infarct.  Mild chronic microvascular ischemic changes. Bilateral mastoid sinus effusion. Electronically Signed   By: Franchot Gallo M.D.   On: 12/10/2015 15:52   Mr Lumbar Spine Wo Contrast  12/09/2015  CLINICAL DATA:  Bacteremia. Altered mental state. Leukocytosis. Dialysis patient EXAM: MRI LUMBAR SPINE WITHOUT CONTRAST TECHNIQUE: Multiplanar, multisequence MR imaging of the lumbar spine was performed. No intravenous contrast was administered. COMPARISON:  Lumbar radiographs 12/04/2015 FINDINGS: Normal lumbar alignment. Negative for fracture or mass lesion. Conus medullaris normal and terminates at L1. 8 x 10 mm fluid collection to the left of the L5 spinous process. No associated bony abnormality. This could represent a small soft tissue abscess. This does not appear to connect with the facet or disc space. No epidural abscess. No evidence of discitis or osteomyelitis. Unfortunately, the patient cannot have intravenous contrast due to dialysis. L1-2: Mild disc degeneration and mild facet degeneration without spinal stenosis L2-3:  Negative L3-4: Mild disc bulging and facet degeneration without significant spinal stenosis L4-5: Mild disc and facet degeneration. Mild spinal stenosis. No disc protrusion. L5-S1: Moderate to advanced disc degeneration with disc space narrowing. Endplate spurring is present. There is subarticular and foraminal stenosis bilaterally of a moderate degree due to spurring. Mild facet degeneration. IMPRESSION: 8 x 10 mm fluid collection in the soft tissues to the left of the L5 spinous process. This does not appear to connect with the epidural space or facet joint. This may represent a small soft tissue abscess given the history. Alternately, this could represent a synovial cyst of the  facet joint or a simple fluid collection. No evidence of discitis or osteomyelitis Lumbar degenerative change as above. Electronically Signed   By: Franchot Gallo M.D.   On: 12/09/2015 11:12   Dg Chest Port 1 View  12/10/2015  CLINICAL DATA:  Dyspnea EXAM: PORTABLE CHEST 1 VIEW COMPARISON:  12/06/2015 chest radiograph. FINDINGS: Vascular stent overlies the left upper mediastinum. Stable cardiomediastinal silhouette with mild cardiomegaly. No pneumothorax. Mild hazy parahilar opacities appear slightly increased. Small left pleural effusion appears increased. Left basilar atelectasis appears increased. IMPRESSION: 1. Cardiomegaly with worsened hazy parahilar opacities, favor worsened mild congestive heart failure. 2. Increased small left pleural effusion with increased left basilar atelectasis. Electronically Signed   By: Ilona Sorrel M.D.   On: 12/10/2015 08:52     Medications:     . aspirin EC  325 mg Oral Daily  . chlorhexidine  15 mL Mouth Rinse BID  . epoetin (EPOGEN/PROCRIT) injection  4,000 Units Intravenous Q M,W,F-HD  . heparin subcutaneous  5,000 Units Subcutaneous 3 times per day  . insulin aspart  0-5 Units Subcutaneous QHS  . insulin aspart  0-9 Units Subcutaneous TID WC  . latanoprost  1 drop Both Eyes QHS  . mirtazapine  15 mg Oral QHS  . sevelamer carbonate  2,400 mg Oral TID WC  . sodium chloride flush  3 mL Intravenous Q12H  . vancomycin  750 mg Intravenous Q M,W,F-HD   sodium chloride, acetaminophen **OR** acetaminophen, ipratropium-albuterol, labetalol, ondansetron **OR** ondansetron (ZOFRAN) IV, polyethylene glycol  Assessment/ Plan:  65 y.o. male with end-stage renal disease, diabetes, hypertension [employer]/M-W-F/ [facility]  1. End-stage renal disease with hyperkalemia - K 4.6, no urgent indication for HD at present, will plan for HD again tomorrow.    2. Atrial fibrillation - Pt with periods of arrythmia, off diltiazem now.    3. Anemia of chronic kidney  disease - hgb 9.8 at last check, close to goal, continue epogen.  4. SHPTH - phos well controlled, continue renvela with meals if awake enough.       LOS: 6 Briyanna Billingham 3/19/20174:59 PM

## 2015-12-11 DIAGNOSIS — G9341 Metabolic encephalopathy: Secondary | ICD-10-CM

## 2015-12-11 LAB — GLUCOSE, CAPILLARY
GLUCOSE-CAPILLARY: 92 mg/dL (ref 65–99)
Glucose-Capillary: 95 mg/dL (ref 65–99)
Glucose-Capillary: 99 mg/dL (ref 65–99)
Glucose-Capillary: 161 mg/dL — ABNORMAL HIGH (ref 65–99)

## 2015-12-11 LAB — CULTURE, BLOOD (ROUTINE X 2)

## 2015-12-11 LAB — CBC
HEMATOCRIT: 26.6 % — AB (ref 40.0–52.0)
Hemoglobin: 8.8 g/dL — ABNORMAL LOW (ref 13.0–18.0)
MCH: 28.2 pg (ref 26.0–34.0)
MCHC: 33.1 g/dL (ref 32.0–36.0)
MCV: 85.1 fL (ref 80.0–100.0)
PLATELETS: 234 10*3/uL (ref 150–440)
RBC: 3.13 MIL/uL — AB (ref 4.40–5.90)
RDW: 16.7 % — ABNORMAL HIGH (ref 11.5–14.5)
WBC: 12.3 10*3/uL — ABNORMAL HIGH (ref 3.8–10.6)

## 2015-12-11 LAB — SODIUM: Sodium: 129 mmol/L — ABNORMAL LOW (ref 135–145)

## 2015-12-11 LAB — VANCOMYCIN, RANDOM: Vancomycin Rm: 16 ug/mL

## 2015-12-11 MED ORDER — CEFAZOLIN SODIUM-DEXTROSE 2-3 GM-% IV SOLR
2.0000 g | Freq: Once | INTRAVENOUS | Status: AC
  Administered 2015-12-11: 2 g via INTRAVENOUS
  Filled 2015-12-11: qty 50

## 2015-12-11 MED ORDER — LIDOCAINE-PRILOCAINE 2.5-2.5 % EX CREA
TOPICAL_CREAM | CUTANEOUS | Status: DC | PRN
  Filled 2015-12-11: qty 5

## 2015-12-11 MED ORDER — CEFAZOLIN SODIUM-DEXTROSE 2-3 GM-% IV SOLR
2.0000 g | INTRAVENOUS | Status: DC
  Filled 2015-12-11: qty 50

## 2015-12-11 NOTE — Progress Notes (Signed)
CSW contacted Arlie Solomons, Administrator at [facility] [phone]) to discuss patient's care at their facility. Per Manuela Schwartz she reports that patient's 30 day notice was issued on Feb. 20th, 2017. She reports that the letter was issued due to patient not wanting to take his 2nd TB test. She reports that patient also refused to take his medications, refused to eat and refused hemodialysis. She reports patient also did not want to take his money to pay for his medications. She reports he demanded that she get him a $45 phone card each month out of his $48 budget and it did not allow him to have enough money for medications. She stated she is not able to take patient back.   CSW reviewed patient's chart and feels that ALF/ Group Home is not an appropriate level of care for patient at this time. CSW feels that Long-term care (LTC) SNF may be appropriate for patient at this time unless patient's prognosis positively progresses. CSW completed FL2 but was not able to speak to patient about discharge plans (LTC SNF) because he was down for hemodialysis. CSW will attempt to speak to patient when he returns to the 2A.   CSW will continue to follow and assist.  Ernest Pine, MSW, [facility] Work Department [phone]

## 2015-12-11 NOTE — Op Note (Signed)
Operative Note     Preoperative diagnosis:   1. ESRD with bacteremia  Postoperative diagnosis:  1. ESRD with bacteremia  Procedure:  Removal of right femoral Permcath  Surgeon:  Leotis Pain, MD  Anesthesia:  Local  EBL:  Minimal  Indication for the Procedure:  The patient has end-stage renal disease with a right thigh PermCath in place. He has bacteremia and this needs to be removed. He has lethargy and poor mental status and consent is obtained from his family.  Description of the Procedure:  The patient's right groin, thigh, and existing catheter were sterilely prepped and draped. The area around the catheter was anesthetized copiously with 1% lidocaine. The catheter was dissected out with curved hemostats until the cuff was freed from the surrounding fibrous sheath. The fiber sheath was transected, and the catheter was then removed in its entirety using gentle traction. Pressure was held and sterile dressings were placed. The patient tolerated the procedure well and was taken to the recovery room in stable condition.     DEW,JASON  12/11/2015, 6:02 PM

## 2015-12-11 NOTE — Progress Notes (Signed)
Subjective:  Patient seen and evaluated during hemodialysis today. Patient requests a dialysis nurse to not use hematemesis access but to use PermCath instead.  Objective:  Vital signs in last 24 hours:  Temp:  [97.5 F (36.4 C)-98.2 F (36.8 C)] 98.2 F (36.8 C) (03/20 [id number]) Pulse Rate:  [83-100] 98 (03/20 1100) Resp:  [20-24] 20 (03/20 1100) BP: (95-156)/(59-98) 156/93 mmHg (03/20 1100) SpO2:  [95 %-100 %] 100 % (03/20 1100) Weight:  [76.2 kg (167 lb 15.9 oz)-76.204 kg (168 lb)] 76.2 kg (167 lb 15.9 oz) (03/20 0937)  Weight change: 2.087 kg (4 lb 9.6 oz) Filed Weights   12/10/15 0424 12/11/15 0608 12/11/15 0937  Weight: 74.118 kg (163 lb 6.4 oz) 76.204 kg (168 lb) 76.2 kg (167 lb 15.9 oz)    Intake/Output:    Intake/Output Summary (Last 24 hours) at 12/11/15 1109 Last data filed at 12/11/15 0949  Gross per 24 hour  Intake    120 ml  Output      0 ml  Net    120 ml     Physical Exam: General: Chronically ill-appearing, laying in the bed   HEENT Moist oral mucous membranes   Neck Supple  Pulm/lungs Normal respiratory effort,  Scattered rhonchi  CVS/Heart Irregular rhythm   Abdomen:  Soft, nontender, BS present  Extremities:  + Dependent edema   Neurologic: Lethargic but arousable  Skin: No acute rashes   Access: Left arm AV fistula, right femoral dialysis catheter       Basic Metabolic Panel:   Recent Labs Lab 12/04/15 1544 12/04/15 2240 12/05/15 0422 12/05/15 1507 12/06/15 1309 12/10/15 0702 12/11/15 0726  NA 133*  --  132* 133* 131* 130* 129*  K 6.1* 5.0 4.7 3.8 4.7 4.6  --   CL 97*  --  98* 95* 93* 94*  --   CO2 19*  --  16* 26 22 21*  --   GLUCOSE 96  --  126* 107* 112* 84  --   BUN 88*  --  97* 63* 69* 66*  --   CREATININE 11.79*  --  12.44* 8.39* 9.94* 9.71*  --   CALCIUM 8.5*  --  8.0* 8.1* 7.4* 7.5*  --   MG  --   --   --   --   --  2.6*  --   PHOS  --   --   --  3.6  --   --   --      CBC:  Recent Labs Lab 12/07/15 0831  12/08/15 0252 12/09/15 0627 12/10/15 0702 12/11/15 0726  WBC 12.9* 13.6* 12.6* 14.6* 12.3*  HGB 10.0* 9.6* 9.8* 9.7* 8.8*  HCT 30.3* 29.0* 29.8* 29.2* 26.6*  MCV 84.8 84.5 85.4 86.0 85.1  PLT 145* 181 193 199 234      Microbiology:  Recent Results (from the past 720 hour(s))  MRSA PCR Screening     Status: None   Collection Time: 12/04/15 10:40 PM  Result Value Ref Range Status   MRSA by PCR NEGATIVE NEGATIVE Final    Comment:        The GeneXpert MRSA Assay (FDA approved for NASAL specimens only), is one component of a comprehensive MRSA colonization surveillance program. It is not intended to diagnose MRSA infection nor to guide or monitor treatment for MRSA infections.   CULTURE, BLOOD (ROUTINE X 2) w Reflex to PCR ID Panel     Status: None   Collection Time: 12/05/15 10:00 PM  Result  Value Ref Range Status   Specimen Description BLOOD RIGHT ANTECUBITAL  Final   Special Requests BOTTLES DRAWN AEROBIC AND ANAEROBIC 5ML  Final   Culture  Setup Time   Final    GRAM POSITIVE COCCI ANAEROBIC BOTTLE ONLY CRITICAL RESULT CALLED TO, READ BACK BY AND VERIFIED WITH: MELISSA [place] 12/07/15 1458    Culture STAPHYLOCOCCUS AUREUS ANAEROBIC BOTTLE ONLY   Final   Report Status 12/11/2015 FINAL  Final   Organism ID, Bacteria STAPHYLOCOCCUS AUREUS  Final      Susceptibility   Staphylococcus aureus - MIC*    CIPROFLOXACIN <=0.5 SENSITIVE Sensitive     ERYTHROMYCIN 4 RESISTANT Resistant     GENTAMICIN <=0.5 SENSITIVE Sensitive     OXACILLIN <=0.25 SENSITIVE Sensitive     TETRACYCLINE <=1 SENSITIVE Sensitive     VANCOMYCIN 1 SENSITIVE Sensitive     TRIMETH/SULFA <=10 SENSITIVE Sensitive     CLINDAMYCIN RESISTANT Resistant     RIFAMPIN <=0.5 SENSITIVE Sensitive     Inducible Clindamycin Value in next row Resistant      POSITIVEINDUCIBLE CLINDAMYCIN RESISTANCE - A positive ICR test is indicative of inducible resistance to macrolides, lincosamides, and type B streptogramin.  This  isolate is presumed to be resistant to Clindamycin, however, Clindamycin may still be effective in some patients.     * STAPHYLOCOCCUS AUREUS  Blood Culture ID Panel (Reflexed)     Status: Abnormal   Collection Time: 12/05/15 10:00 PM  Result Value Ref Range Status   Enterococcus species NOT DETECTED NOT DETECTED Final   Vancomycin resistance NOT DETECTED NOT DETECTED Final   Listeria monocytogenes NOT DETECTED NOT DETECTED Final   Staphylococcus species DETECTED (A) NOT DETECTED Final    Comment: CRITICAL RESULT CALLED TO, READ BACK BY AND VERIFIED WITH: MELISSA MECCIA 12/07/15 1458 MLM    Staphylococcus aureus DETECTED (A) NOT DETECTED Final    Comment: CRITICAL RESULT CALLED TO, READ BACK BY AND VERIFIED WITH: MELISSA MECCIA 12/07/15 1458 MLM    Methicillin resistance NOT DETECTED NOT DETECTED Final   Streptococcus species NOT DETECTED NOT DETECTED Final   Streptococcus agalactiae NOT DETECTED NOT DETECTED Final   Streptococcus pneumoniae NOT DETECTED NOT DETECTED Final   Streptococcus pyogenes NOT DETECTED NOT DETECTED Final   Acinetobacter baumannii NOT DETECTED NOT DETECTED Final   Enterobacteriaceae species NOT DETECTED NOT DETECTED Final   Enterobacter cloacae complex NOT DETECTED NOT DETECTED Final   Escherichia coli NOT DETECTED NOT DETECTED Final   Klebsiella oxytoca NOT DETECTED NOT DETECTED Final   Klebsiella pneumoniae NOT DETECTED NOT DETECTED Final   Proteus species NOT DETECTED NOT DETECTED Final   Serratia marcescens NOT DETECTED NOT DETECTED Final   Carbapenem resistance NOT DETECTED NOT DETECTED Final   Haemophilus influenzae NOT DETECTED NOT DETECTED Final   Neisseria meningitidis NOT DETECTED NOT DETECTED Final   Pseudomonas aeruginosa NOT DETECTED NOT DETECTED Final   Candida albicans NOT DETECTED NOT DETECTED Final   Candida glabrata NOT DETECTED NOT DETECTED Final   Candida krusei NOT DETECTED NOT DETECTED Final   Candida parapsilosis NOT DETECTED NOT  DETECTED Final   Candida tropicalis NOT DETECTED NOT DETECTED Final  CULTURE, BLOOD (ROUTINE X 2) w Reflex to PCR ID Panel     Status: None   Collection Time: 12/05/15 10:05 PM  Result Value Ref Range Status   Specimen Description BLOOD BLOOD RIGHT FOREARM  Final   Special Requests BOTTLES DRAWN AEROBIC AND ANAEROBIC 5ML  Final   Culture NO GROWTH 5  DAYS  Final   Report Status 12/10/2015 FINAL  Final  CULTURE, BLOOD (ROUTINE X 2) w Reflex to PCR ID Panel     Status: None (Preliminary result)   Collection Time: 12/08/15  2:52 PM  Result Value Ref Range Status   Specimen Description BLOOD RIGHT HAND  Final   Special Requests   Final    BOTTLES DRAWN AEROBIC AND ANAEROBIC 8CC AERO 6CC ANA   Culture NO GROWTH 3 DAYS  Final   Report Status PENDING  Incomplete  CULTURE, BLOOD (ROUTINE X 2) w Reflex to PCR ID Panel     Status: None (Preliminary result)   Collection Time: 12/08/15  6:19 PM  Result Value Ref Range Status   Specimen Description BLOOD RIGHT WRIST  Final   Special Requests   Final    BOTTLES DRAWN AEROBIC AND ANAEROBIC 11CCAERO,8CCANA   Culture NO GROWTH 3 DAYS  Final   Report Status PENDING  Incomplete    Coagulation Studies: No results for input(s): LABPROT, INR in the last 72 hours.  Urinalysis: No results for input(s): COLORURINE, LABSPEC, PHURINE, GLUCOSEU, HGBUR, BILIRUBINUR, KETONESUR, PROTEINUR, UROBILINOGEN, NITRITE, LEUKOCYTESUR in the last 72 hours.  Invalid input(s): APPERANCEUR    Imaging: Mr Brain Wo Contrast  12/10/2015  CLINICAL DATA:  Altered mental status. EXAM: MRI HEAD WITHOUT CONTRAST TECHNIQUE: Multiplanar, multiecho pulse sequences of the brain and surrounding structures were obtained without intravenous contrast. COMPARISON:  CT head 12/08/2015 FINDINGS: Image quality degraded by moderate motion Moderate atrophy.  Negative for hydrocephalus Negative for acute infarct. Mild chronic microvascular ischemic change in the white matter. Brainstem intact.  Cerebellum and basal ganglia intact. Negative for hemorrhage or mass. No shift of the midline structures. Normal skull base. Minimal mucosal edema paranasal sinuses. Extensive mastoid effusion bilaterally. IMPRESSION: Image quality degraded by moderate motion. No acute infarct.  Mild chronic microvascular ischemic changes. Bilateral mastoid sinus effusion. Electronically Signed   By: Franchot Gallo M.D.   On: 12/10/2015 15:52   Dg Chest Port 1 View  12/10/2015  CLINICAL DATA:  Dyspnea EXAM: PORTABLE CHEST 1 VIEW COMPARISON:  12/06/2015 chest radiograph. FINDINGS: Vascular stent overlies the left upper mediastinum. Stable cardiomediastinal silhouette with mild cardiomegaly. No pneumothorax. Mild hazy parahilar opacities appear slightly increased. Small left pleural effusion appears increased. Left basilar atelectasis appears increased. IMPRESSION: 1. Cardiomegaly with worsened hazy parahilar opacities, favor worsened mild congestive heart failure. 2. Increased small left pleural effusion with increased left basilar atelectasis. Electronically Signed   By: Ilona Sorrel M.D.   On: 12/10/2015 08:52     Medications:     . aspirin EC  325 mg Oral Daily  . chlorhexidine  15 mL Mouth Rinse BID  . epoetin (EPOGEN/PROCRIT) injection  4,000 Units Intravenous Q M,W,F-HD  . heparin subcutaneous  5,000 Units Subcutaneous 3 times per day  . insulin aspart  0-5 Units Subcutaneous QHS  . insulin aspart  0-9 Units Subcutaneous TID WC  . latanoprost  1 drop Both Eyes QHS  . mirtazapine  15 mg Oral QHS  . sevelamer carbonate  2,400 mg Oral TID WC  . sodium chloride flush  3 mL Intravenous Q12H  . vancomycin  750 mg Intravenous Q M,W,F-HD   sodium chloride, acetaminophen **OR** acetaminophen, ipratropium-albuterol, labetalol, ondansetron **OR** ondansetron (ZOFRAN) IV, polyethylene glycol  Assessment/ Plan:  65 y.o. male with end-stage renal disease, diabetes, hypertension [employer]/M-W-F/ [facility]  1.  End-stage renal disease with hyperkalemia - Patient seen and evaluated during hemodialysis. Tolerating  well. He requested nurse to use PermCath instead of access. We will need to get the PermCath out in the relative near future.   2. Atrial fibrillation - Was previous on diltiazem however now taken off.   3. Anemia of chronic kidney disease - Hemoglobin down to 8.8. For now continue Epogen 4000 units IV with dialysis.  4. SHPTH - Continue Renvela 2400 mg by mouth 3 times a day with meals.  5.  Altered mental status:  Still lethargic but arousable.  MRI brain reviewed.        LOS: 7 Helmuth Recupero 3/20/201711:09 AM

## 2015-12-11 NOTE — Consult Note (Signed)
Reason for Consult:Altered mental status Referring Physician: Ether Lewis  CC: Confusion  HPI: Noah Lewis is an 65 y.o. male presenting on 3/13 due to back pain and refusal of dialysis.  Patient admitted but patient developed lethargy.  Was found to have a new onset atrial fibrillation, gram positive bacteremia and multiple metabolic abnormalities.  Consult called for continued altered mental status.   Currently patient awake in the bed watching a Noah Lewis movie on his phone.    Past Medical History  Diagnosis Date  . Cancer ([place])   . Arthritis   . Renal disorder   . Hypertension   . Diabetes mellitus without complication ([place])   . Asthma   . COPD (chronic obstructive pulmonary disease) ([place])   . Coronary artery disease     Past Surgical History  Procedure Laterality Date  . No past surgeries    . Insertion of dialysis catheter    . Insertion of dialysis catheter N/A 05/29/2015    Procedure: INSERTION OF DIALYSIS CATHETER;  Surgeon: Angelia Mould, MD;  Location: [address];  Service: Vascular;  Laterality: N/A;  . Peripheral vascular catheterization N/A 10/25/2015    Procedure: Dialysis/Perma Catheter Insertion;  Surgeon: Katha Cabal, MD;  Location: [place] [facility];  Service: Cardiovascular;  Laterality: N/A;    Family History  Problem Relation Age of Onset  . Hypertension Other   . Diabetes Other     Social History:  reports that he has never smoked. He has never used smokeless tobacco. He reports that he does not drink alcohol or use illicit drugs.  Allergies  Allergen Reactions  . Penicillins Other (See Comments)    Reaction:  Unknown     Medications:  I have reviewed the patient's current medications. Prior to Admission:  Prescriptions prior to admission  Medication Sig Dispense Refill Last Dose  . acetaminophen (TYLENOL) 325 MG tablet Take 650 mg by mouth every 6 (six) hours as needed for mild pain, fever or headache.   12/03/2015 at [facility]  . aspirin  81 MG chewable tablet Chew 1 tablet (81 mg total) by mouth daily. 30 tablet 11 12/04/2015 at 0800  . b complex-vitamin c-folic acid (NEPHRO-VITE) 0.8 MG TABS tablet Take 1 tablet by mouth 2 (two) times daily.   12/04/2015 at Unknown time  . capsaicin (ZOSTRIX) 0.025 % cream Apply 1 application topically 2 (two) times daily as needed (for lower back pain).   PRN at PRN  . ipratropium-albuterol (DUONEB) 0.5-2.5 (3) MG/3ML SOLN Take 3 mLs by nebulization every 6 (six) hours as needed (for wheezing/shortness of breath).   PRN at PRN  . labetalol (NORMODYNE) 200 MG tablet Take 1 tablet (200 mg total) by mouth 3 (three) times daily. 90 tablet 0 12/04/2015 at 0800  . loratadine (CLARITIN) 10 MG tablet Take 1 tablet (10 mg total) by mouth daily. 30 tablet 11 12/04/2015 at Unknown time  . polyethylene glycol (MIRALAX / GLYCOLAX) packet Take 17 g by mouth daily as needed for mild constipation. 14 each 0 PRN at PRN  . sevelamer carbonate (RENVELA) 800 MG tablet Take 2,400 mg by mouth 3 (three) times daily with meals.   12/04/2015 at Unknown time  . Travoprost, BAK Free, (TRAVATAN) 0.004 % SOLN ophthalmic solution Place 1 drop into both eyes at bedtime.   12/03/2015 at Unknown time  . enalapril (VASOTEC) 5 MG tablet Take 1 tablet (5 mg total) by mouth daily. (Patient not taking: Reported on 12/04/2015) 30 tablet 0 10/24/2015 at  Unknown time  . oxyCODONE-acetaminophen (ROXICET) 5-325 MG tablet Take 1 tablet by mouth every 6 (six) hours as needed. (Patient not taking: Reported on 12/04/2015) 15 tablet 0 Past Week at Unknown time   Scheduled: . aspirin EC  325 mg Oral Daily  . chlorhexidine  15 mL Mouth Rinse BID  . epoetin (EPOGEN/PROCRIT) injection  4,000 Units Intravenous Q M,W,F-HD  . heparin subcutaneous  5,000 Units Subcutaneous 3 times per day  . insulin aspart  0-5 Units Subcutaneous QHS  . insulin aspart  0-9 Units Subcutaneous TID WC  . latanoprost  1 drop Both Eyes QHS  . mirtazapine  15 mg Oral QHS  .  sevelamer carbonate  2,400 mg Oral TID WC  . sodium chloride flush  3 mL Intravenous Q12H  . vancomycin  750 mg Intravenous Q M,W,F-HD    ROS: History obtained from the patient  General ROS: negative for - chills, fatigue, fever, night sweats, weight gain or weight loss Psychological ROS: negative for - behavioral disorder, hallucinations, memory difficulties, mood swings or suicidal ideation Ophthalmic ROS: negative for - blurry vision, double vision, eye pain or loss of vision ENT ROS: HOH Allergy and Immunology ROS: negative for - hives or itchy/watery eyes Hematological and Lymphatic ROS: negative for - bleeding problems, bruising or swollen lymph nodes Endocrine ROS: negative for - galactorrhea, hair pattern changes, polydipsia/polyuria or temperature intolerance Respiratory ROS: negative for - cough, hemoptysis, shortness of breath or wheezing Cardiovascular ROS: negative for - chest pain, dyspnea on exertion, edema or irregular heartbeat Gastrointestinal ROS: negative for - abdominal pain, diarrhea, hematemesis, nausea/vomiting or stool incontinence Genito-Urinary ROS: negative for - dysuria, hematuria, incontinence or urinary frequency/urgency Musculoskeletal ROS: negative for - joint swelling or muscular weakness Neurological ROS: as noted in HPI Dermatological ROS: negative for rash and skin lesion changes  Physical Examination: Blood pressure 118/76, pulse 97, temperature 98.2 F (36.8 C), temperature source Oral, resp. rate 21, height 5\' 9"  (1.753 m), weight 76.2 kg (167 lb 15.9 oz), SpO2 95 %.  HEENT-  Normocephalic, no lesions, without obvious abnormality.  Normal external eye and conjunctiva.  Normal TM's bilaterally.  Normal auditory canals and external ears. Normal external nose, mucus membranes and septum.  Normal pharynx. Cardiovascular- S1, S2 normal, pulses palpable throughout   Lungs- chest clear, no wheezing, rales, normal symmetric air entry Abdomen- soft,  non-tender; bowel sounds normal; no masses,  no organomegaly Extremities- no edema Lymph-no adenopathy palpable Musculoskeletal-no joint tenderness, deformity or swelling Skin-warm and dry, no hyperpigmentation, vitiligo, or suspicious lesions  Neurological Examination Mental Status: Alert.  Speech slurred but fluent.  Able to follow commands. Cranial Nerves: II: Discs flat bilaterally; Visual fields grossly normal, pupils equal, round, reactive to light and accommodation III,IV, VI: ptosis not present, right VIth nerve palsy V,VII: decrease in the left NLF, facial light touch sensation normal bilaterally VIII: hearing normal bilaterally IX,X: gag reflex present XI: bilateral shoulder shrug XII: midline tongue extension Motor: Moves both upper extremities against gravity but does not lift the lower extremities off the bed.   Sensory: Pinprick and light touch intact throughout, bilaterally Deep Tendon Reflexes: 2+ in the upper extremities and absent in the lower extremties Plantars: Right: downgoing   Left: downgoing Cerebellar: Normal finger-to-nose testing bilaterally Gait: not tested due to safety concerns   Laboratory Studies:   Basic Metabolic Panel:  Recent Labs Lab 12/04/15 1544 12/04/15 2240 12/05/15 0422 12/05/15 1507 12/06/15 1309 12/10/15 0702 12/11/15 0726  NA 133*  --  132* 133* 131* 130* 129*  K 6.1* 5.0 4.7 3.8 4.7 4.6  --   CL 97*  --  98* 95* 93* 94*  --   CO2 19*  --  16* 26 22 21*  --   GLUCOSE 96  --  126* 107* 112* 84  --   BUN 88*  --  97* 63* 69* 66*  --   CREATININE 11.79*  --  12.44* 8.39* 9.94* 9.71*  --   CALCIUM 8.5*  --  8.0* 8.1* 7.4* 7.5*  --   MG  --   --   --   --   --  2.6*  --   PHOS  --   --   --  3.6  --   --   --     Liver Function Tests:  Recent Labs Lab 12/04/15 1544 12/05/15 1507  AST 33  --   ALT 24  --   ALKPHOS 84  --   BILITOT 0.8  --   PROT 7.7  --   ALBUMIN 3.4* 3.2*   No results for input(s): LIPASE,  AMYLASE in the last 168 hours.  Recent Labs Lab 12/06/15 0958  AMMONIA 27    CBC:  Recent Labs Lab 12/07/15 0831 12/08/15 0252 12/09/15 0627 12/10/15 0702 12/11/15 0726  WBC 12.9* 13.6* 12.6* 14.6* 12.3*  HGB 10.0* 9.6* 9.8* 9.7* 8.8*  HCT 30.3* 29.0* 29.8* 29.2* 26.6*  MCV 84.8 84.5 85.4 86.0 85.1  PLT 145* 181 193 199 234    Cardiac Enzymes:  Recent Labs Lab 12/04/15 1544 12/04/15 2240 12/05/15 0422  TROPONINI 0.05* 0.06* 0.05*    BNP: Invalid input(s): POCBNP  CBG:  Recent Labs Lab 12/10/15 0733 12/10/15 1142 12/10/15 1650 12/10/15 2138 12/11/15 0733  GLUCAP 90 118* 106* 9 95    Microbiology: Results for orders placed or performed during the hospital encounter of 12/04/15  MRSA PCR Screening     Status: None   Collection Time: 12/04/15 10:40 PM  Result Value Ref Range Status   MRSA by PCR NEGATIVE NEGATIVE Final    Comment:        The GeneXpert MRSA Assay (FDA approved for NASAL specimens only), is one component of a comprehensive MRSA colonization surveillance program. It is not intended to diagnose MRSA infection nor to guide or monitor treatment for MRSA infections.   CULTURE, BLOOD (ROUTINE X 2) w Reflex to PCR ID Panel     Status: None   Collection Time: 12/05/15 10:00 PM  Result Value Ref Range Status   Specimen Description BLOOD RIGHT ANTECUBITAL  Final   Special Requests BOTTLES DRAWN AEROBIC AND ANAEROBIC 5ML  Final   Culture  Setup Time   Final    GRAM POSITIVE COCCI ANAEROBIC BOTTLE ONLY CRITICAL RESULT CALLED TO, READ BACK BY AND VERIFIED WITH: MELISSA Newberry 12/07/15 1458    Culture STAPHYLOCOCCUS AUREUS ANAEROBIC BOTTLE ONLY   Final   Report Status 12/11/2015 FINAL  Final   Organism ID, Bacteria STAPHYLOCOCCUS AUREUS  Final      Susceptibility   Staphylococcus aureus - MIC*    CIPROFLOXACIN <=0.5 SENSITIVE Sensitive     ERYTHROMYCIN 4 RESISTANT Resistant     GENTAMICIN <=0.5 SENSITIVE Sensitive     OXACILLIN <=0.25  SENSITIVE Sensitive     TETRACYCLINE <=1 SENSITIVE Sensitive     VANCOMYCIN 1 SENSITIVE Sensitive     TRIMETH/SULFA <=10 SENSITIVE Sensitive     CLINDAMYCIN RESISTANT Resistant     RIFAMPIN <=0.5  SENSITIVE Sensitive     Inducible Clindamycin Value in next row Resistant      POSITIVEINDUCIBLE CLINDAMYCIN RESISTANCE - A positive ICR test is indicative of inducible resistance to macrolides, lincosamides, and type B streptogramin.  This isolate is presumed to be resistant to Clindamycin, however, Clindamycin may still be effective in some patients.     * STAPHYLOCOCCUS AUREUS  Blood Culture ID Panel (Reflexed)     Status: Abnormal   Collection Time: 12/05/15 10:00 PM  Result Value Ref Range Status   Enterococcus species NOT DETECTED NOT DETECTED Final   Vancomycin resistance NOT DETECTED NOT DETECTED Final   Listeria monocytogenes NOT DETECTED NOT DETECTED Final   Staphylococcus species DETECTED (A) NOT DETECTED Final    Comment: CRITICAL RESULT CALLED TO, READ BACK BY AND VERIFIED WITH: MELISSA MECCIA 12/07/15 1458 MLM    Staphylococcus aureus DETECTED (A) NOT DETECTED Final    Comment: CRITICAL RESULT CALLED TO, READ BACK BY AND VERIFIED WITH: MELISSA MECCIA 12/07/15 1458 MLM    Methicillin resistance NOT DETECTED NOT DETECTED Final   Streptococcus species NOT DETECTED NOT DETECTED Final   Streptococcus agalactiae NOT DETECTED NOT DETECTED Final   Streptococcus pneumoniae NOT DETECTED NOT DETECTED Final   Streptococcus pyogenes NOT DETECTED NOT DETECTED Final   Acinetobacter baumannii NOT DETECTED NOT DETECTED Final   Enterobacteriaceae species NOT DETECTED NOT DETECTED Final   Enterobacter cloacae complex NOT DETECTED NOT DETECTED Final   Escherichia coli NOT DETECTED NOT DETECTED Final   Klebsiella oxytoca NOT DETECTED NOT DETECTED Final   Klebsiella pneumoniae NOT DETECTED NOT DETECTED Final   Proteus species NOT DETECTED NOT DETECTED Final   Serratia marcescens NOT DETECTED NOT  DETECTED Final   Carbapenem resistance NOT DETECTED NOT DETECTED Final   Haemophilus influenzae NOT DETECTED NOT DETECTED Final   Neisseria meningitidis NOT DETECTED NOT DETECTED Final   Pseudomonas aeruginosa NOT DETECTED NOT DETECTED Final   Candida albicans NOT DETECTED NOT DETECTED Final   Candida glabrata NOT DETECTED NOT DETECTED Final   Candida krusei NOT DETECTED NOT DETECTED Final   Candida parapsilosis NOT DETECTED NOT DETECTED Final   Candida tropicalis NOT DETECTED NOT DETECTED Final  CULTURE, BLOOD (ROUTINE X 2) w Reflex to PCR ID Panel     Status: None   Collection Time: 12/05/15 10:05 PM  Result Value Ref Range Status   Specimen Description BLOOD BLOOD RIGHT FOREARM  Final   Special Requests BOTTLES DRAWN AEROBIC AND ANAEROBIC 5ML  Final   Culture NO GROWTH 5 DAYS  Final   Report Status 12/10/2015 FINAL  Final  CULTURE, BLOOD (ROUTINE X 2) w Reflex to PCR ID Panel     Status: None (Preliminary result)   Collection Time: 12/08/15  2:52 PM  Result Value Ref Range Status   Specimen Description BLOOD RIGHT HAND  Final   Special Requests   Final    BOTTLES DRAWN AEROBIC AND ANAEROBIC 8CC AERO 6CC ANA   Culture NO GROWTH 3 DAYS  Final   Report Status PENDING  Incomplete  CULTURE, BLOOD (ROUTINE X 2) w Reflex to PCR ID Panel     Status: None (Preliminary result)   Collection Time: 12/08/15  6:19 PM  Result Value Ref Range Status   Specimen Description BLOOD RIGHT WRIST  Final   Special Requests   Final    BOTTLES DRAWN AEROBIC AND ANAEROBIC 11CCAERO,8CCANA   Culture NO GROWTH 3 DAYS  Final   Report Status PENDING  Incomplete  Coagulation Studies: No results for input(s): LABPROT, INR in the last 72 hours.  Urinalysis: No results for input(s): COLORURINE, LABSPEC, PHURINE, GLUCOSEU, HGBUR, BILIRUBINUR, KETONESUR, PROTEINUR, UROBILINOGEN, NITRITE, LEUKOCYTESUR in the last 168 hours.  Invalid input(s): APPERANCEUR  Lipid Panel:     Component Value Date/Time   CHOL  120 12/23/2007 1120   TRIG 94 12/23/2007 1120   HDL 28.3* 12/23/2007 1120   CHOLHDL 4.2 CALC 12/23/2007 1120   VLDL 19 12/23/2007 1120   LDLCALC 73 12/23/2007 1120    HgbA1C:  Lab Results  Component Value Date   HGBA1C 5.7* 04/28/2015    Urine Drug Screen:     Component Value Date/Time   LABOPIA NONE DETECTED 10/26/2007 1400   COCAINSCRNUR NONE DETECTED 10/26/2007 1400   LABBENZ NONE DETECTED 10/26/2007 1400   AMPHETMU NONE DETECTED 10/26/2007 1400   THCU NONE DETECTED 10/26/2007 1400   LABBARB  10/26/2007 1400    NONE DETECTED        DRUG SCREEN FOR MEDICAL PURPOSES ONLY.  IF CONFIRMATION IS NEEDED FOR ANY PURPOSE, NOTIFY LAB WITHIN 5 DAYS.    Alcohol Level: No results for input(s): ETH in the last 168 hours.  Other results: EKG: atrial fibrillation with RVR, rate 131.  Imaging: Mr Brain Wo Contrast  12/10/2015  CLINICAL DATA:  Altered mental status. EXAM: MRI HEAD WITHOUT CONTRAST TECHNIQUE: Multiplanar, multiecho pulse sequences of the brain and surrounding structures were obtained without intravenous contrast. COMPARISON:  CT head 12/08/2015 FINDINGS: Image quality degraded by moderate motion Moderate atrophy.  Negative for hydrocephalus Negative for acute infarct. Mild chronic microvascular ischemic change in the white matter. Brainstem intact. Cerebellum and basal ganglia intact. Negative for hemorrhage or mass. No shift of the midline structures. Normal skull base. Minimal mucosal edema paranasal sinuses. Extensive mastoid effusion bilaterally. IMPRESSION: Image quality degraded by moderate motion. No acute infarct.  Mild chronic microvascular ischemic changes. Bilateral mastoid sinus effusion. Electronically Signed   By: Franchot Gallo M.D.   On: 12/10/2015 15:52   Mr Lumbar Spine Wo Contrast  12/09/2015  CLINICAL DATA:  Bacteremia. Altered mental state. Leukocytosis. Dialysis patient EXAM: MRI LUMBAR SPINE WITHOUT CONTRAST TECHNIQUE: Multiplanar, multisequence MR  imaging of the lumbar spine was performed. No intravenous contrast was administered. COMPARISON:  Lumbar radiographs 12/04/2015 FINDINGS: Normal lumbar alignment. Negative for fracture or mass lesion. Conus medullaris normal and terminates at L1. 8 x 10 mm fluid collection to the left of the L5 spinous process. No associated bony abnormality. This could represent a small soft tissue abscess. This does not appear to connect with the facet or disc space. No epidural abscess. No evidence of discitis or osteomyelitis. Unfortunately, the patient cannot have intravenous contrast due to dialysis. L1-2: Mild disc degeneration and mild facet degeneration without spinal stenosis L2-3:  Negative L3-4: Mild disc bulging and facet degeneration without significant spinal stenosis L4-5: Mild disc and facet degeneration. Mild spinal stenosis. No disc protrusion. L5-S1: Moderate to advanced disc degeneration with disc space narrowing. Endplate spurring is present. There is subarticular and foraminal stenosis bilaterally of a moderate degree due to spurring. Mild facet degeneration. IMPRESSION: 8 x 10 mm fluid collection in the soft tissues to the left of the L5 spinous process. This does not appear to connect with the epidural space or facet joint. This may represent a small soft tissue abscess given the history. Alternately, this could represent a synovial cyst of the facet joint or a simple fluid collection. No evidence of discitis or osteomyelitis Lumbar  degenerative change as above. Electronically Signed   By: Franchot Gallo M.D.   On: 12/09/2015 11:12   Dg Chest Port 1 View  12/10/2015  CLINICAL DATA:  Dyspnea EXAM: PORTABLE CHEST 1 VIEW COMPARISON:  12/06/2015 chest radiograph. FINDINGS: Vascular stent overlies the left upper mediastinum. Stable cardiomediastinal silhouette with mild cardiomegaly. No pneumothorax. Mild hazy parahilar opacities appear slightly increased. Small left pleural effusion appears increased. Left  basilar atelectasis appears increased. IMPRESSION: 1. Cardiomegaly with worsened hazy parahilar opacities, favor worsened mild congestive heart failure. 2. Increased small left pleural effusion with increased left basilar atelectasis. Electronically Signed   By: Ilona Sorrel M.D.   On: 12/10/2015 08:52     Assessment/Plan: 65 year old male with altered mental status and new onset atrial fibrillation.  MRI of the brain personally reviewed and shows no acute changes.  Patient with multiple metabolic issues though, including hyponatremia and worse than baseline renal function.  Also with infection and persistently elevated white blood cell count.  Suspect altered mental status is a consequence of a metabolic encephalopathy.    Recommendations: 1.  Agree with continued medical management.     Alexis Goodell, MD Neurology [phone] 12/11/2015, 10:57 AM

## 2015-12-11 NOTE — Progress Notes (Signed)
Post hd tx 

## 2015-12-11 NOTE — NC FL2 (Signed)
[place] LEVEL OF CARE SCREENING TOOL     IDENTIFICATION  Patient Name: Noah Lewis Birthdate: Aug 02, 1951 Sex: male Admission Date (Current Location): 12/04/2015  [facility] and [place] Number:  Selena Lesser  ([id number]) Facility and Address:  [facility], [address], [place], [place] [address]      Provider Number: [id number]  Attending Physician Name and Address:  Theodoro Grist, MD  Relative Name and Phone Number:       Current Level of Care: Hospital Recommended Level of Care: [place] Prior Approval Number:    Date Approved/Denied:   PASRR Number:  ([id number])  Discharge Plan: SNF    Current Diagnoses: Patient Active Problem List   Diagnosis Date Noted  . Back pain   . Bacteremia   . A-fib ([place]) 12/04/2015  . Hypertensive urgency 09/20/2015  . 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 Anemia of chronic kidney failure   . Left arm swelling   . End-stage renal disease needing dialysis ([place]) 05/29/2015  . ESRD needing dialysis ([place]) 05/29/2015  . 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 COPD, moderate ([place]) 05/24/2015  . OSA (obstructive sleep apnea) 05/24/2015  . 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 ESRD on hemodialysis ([place]) 05/13/2015  . New onset atrial fibrillation ([place]) 05/13/2015  . Volume overload 04/28/2015  . Bipolar depression ([place]) 04/28/2015  . HTN (hypertension) 04/28/2015  . Chronic back pain 04/28/2015  . Facial swelling 04/28/2015  . Left upper extremity swelling     Orientation RESPIRATION BLADDER Height & Weight     Self, Time, Situation, Place  O2 (Nasal Cannula 6 (L/min)) Continent Weight: 167 lb 15.9 oz (76.2 kg) Height:  5\' 9"  (175.3 cm)  BEHAVIORAL SYMPTOMS/MOOD NEUROLOGICAL BOWEL NUTRITION STATUS   (None)  (None) Continent Diet  AMBULATORY STATUS  COMMUNICATION OF NEEDS Skin   Limited Assist Verbally Normal                       Personal Care Assistance Level of Assistance  Bathing, Feeding, Dressing Bathing Assistance: Limited assistance Feeding assistance: Independent Dressing Assistance: Limited assistance     Functional Limitations Info  Sight, Hearing, Speech Sight Info: Adequate Hearing Info: Adequate Speech Info: Adequate    SPECIAL CARE FACTORS FREQUENCY                       Contractures      Additional Factors Info  Code Status, Allergies Code Status Info:  (Full Code) Allergies Info:  (Penicillins)           Current Medications (12/11/2015):  This is the current hospital active medication list Current Facility-Administered Medications  Medication Dose Route Frequency Provider Last Rate Last Dose  . 0.9 %  sodium chloride infusion  250 mL Intravenous PRN Srikar Sudini, MD 10 mL/hr at 12/06/15 0700 250 mL at 12/06/15 0700  . acetaminophen (TYLENOL) tablet 650 mg  650 mg Oral Q6H PRN Hillary Bow, MD       Or  . acetaminophen (TYLENOL) suppository 650 mg  650 mg Rectal Q6H PRN Hillary Bow, MD      . aspirin EC tablet 325 mg  325 mg Oral Daily Colleen Can, MD   325 mg at 12/09/15 1600  . chlorhexidine (PERIDEX) 0.12 % solution 15 mL  15 mL Mouth Rinse BID Lance Coon, MD  15 mL at 12/07/15 2159  . epoetin alfa (EPOGEN,PROCRIT) injection 4,000 Units  4,000 Units Intravenous Q M,W,F-HD Murlean Iba, MD   4,000 Units at 12/08/15 1201  . heparin injection 5,000 Units  5,000 Units Subcutaneous 3 times per day Theodoro Grist, MD   5,000 Units at 12/10/15 2205  . insulin aspart (novoLOG) injection 0-5 Units  0-5 Units Subcutaneous QHS Hillary Bow, MD   0 Units at 12/04/15 2258  . insulin aspart (novoLOG) injection 0-9 Units  0-9 Units Subcutaneous TID WC Hillary Bow, MD   1 Units at 12/05/15 0844  . ipratropium-albuterol (DUONEB) 0.5-2.5 (3) MG/3ML nebulizer solution 3 mL  3 mL  Nebulization Q6H PRN Srikar Sudini, MD      . labetalol (NORMODYNE,TRANDATE) injection 10 mg  10 mg Intravenous Q2H PRN Murlean Iba, MD   10 mg at 12/06/15 1306  . latanoprost (XALATAN) 0.005 % ophthalmic solution 1 drop  1 drop Both Eyes QHS Hillary Bow, MD   1 drop at 12/09/15 2226  . mirtazapine (REMERON SOL-TAB) disintegrating tablet 15 mg  15 mg Oral QHS Gonzella Lex, MD   15 mg at 12/08/15 2142  . ondansetron (ZOFRAN) tablet 4 mg  4 mg Oral Q6H PRN Hillary Bow, MD       Or  . ondansetron (ZOFRAN) injection 4 mg  4 mg Intravenous Q6H PRN Hillary Bow, MD   4 mg at 12/05/15 0853  . polyethylene glycol (MIRALAX / GLYCOLAX) packet 17 g  17 g Oral Daily PRN Srikar Sudini, MD      . sevelamer carbonate (RENVELA) tablet 2,400 mg  2,400 mg Oral TID WC Hillary Bow, MD   2,400 mg at 12/09/15 [id number]  . sodium chloride flush (NS) 0.9 % injection 3 mL  3 mL Intravenous Q12H Srikar Sudini, MD   3 mL at 12/11/15 1000  . vancomycin (VANCOCIN) IVPB 750 mg/150 ml premix  750 mg Intravenous Q M,W,F-HD Demetrios Loll, MD   750 mg at 12/08/15 1241     Discharge Medications: Please see discharge summary for a list of discharge medications.  Relevant Imaging Results:  Relevant Lab Results:   Additional Information  [facility] [id number]; Patient is MRSA PCR + )  Xitlalic Maslin E Keyaira Clapham, LCSW

## 2015-12-11 NOTE — H&P (Addendum)
[place] [facility] [place] NAME: Noah Lewis    MR#:  [id number]  DATE OF BIRTH:  1950-10-18  SUBJECTIVE:  CHIEF COMPLAINT:   Chief Complaint  Patient presents with  . Back Pain  Seen during hemodialysis Minimally responsive to verbal stimuli, not following commands, not responding to palpation of the back, weaned off nonrebreather, now on nasal cannulas. Not able to obtain the review of systems. MRI of the back revealed 8/10 mm fluid collection in the soft tissues at L5 area, no connection with the epidural space or joint. Patient was seen by surgeon, who did not recommend any intervention at present. Surgeon felt that patient's hemodialysis vascular access is likely source of infection. Vascular surgery is consulted to remove her PermCath from right groin. No review of systems possible due to altered mental status. More alert today, opening eyes with painful stimuli, nonverbal, does not follow commands  REVIEW OF SYSTEMS:   ROS unobtainable due to poor responsiveness  DRUG ALLERGIES:   Allergies  Allergen Reactions  . Penicillins Other (See Comments)    Reaction:  Unknown     VITALS:  Blood pressure 120/81, pulse 103, temperature 97.7 F (36.5 C), temperature source Oral, resp. rate 19, height 5\' 9"  (1.753 m), weight 74.2 kg (163 lb 9.3 oz), SpO2 98 %.  PHYSICAL EXAMINATION:  GENERAL:  65 y.o.-year-old patient lying in the bed , snoring, poorly responsive to verbal stimuli or shaking, but able to briefly open his eyes, nonverbal,Answer 1 question  EYES: Pupils equal, round, reactive to light and accommodation. No scleral icterus. Extraocular muscles intact.  HEENT: Head atraumatic, normocephalic. Oropharynx and nasopharynx clear.  NECK:  Supple, no jugular venous distention. No thyroid enlargement, no tenderness.  LUNGS: Bilateral rhonchi, snoring, poor air entry intermittently due to obstructive symptoms. CARDIOVASCULAR: S1, S2  normal. No murmurs, rubs, or gallops.  ABDOMEN: Soft, nontender, nondistended. Bowel sounds present. No organomegaly or mass.  EXTREMITIES: No pedal edema, cyanosis, or clubbing.  NEUROLOGIC: Not moving upper or lower extremities. As poorly responsive. Does not follow commands. PSYCHIATRIC: The patient is drowsy SKIN: No obvious rash, lesion, or ulcer.    LABORATORY PANEL:   CBC  Recent Labs Lab 12/11/15 0726  WBC 12.3*  HGB 8.8*  HCT 26.6*  PLT 234   ------------------------------------------------------------------------------------------------------------------  Chemistries   Recent Labs Lab 12/04/15 1544  12/10/15 0702 12/11/15 0726  NA 133*  < > 130* 129*  K 6.1*  < > 4.6  --   CL 97*  < > 94*  --   CO2 19*  < > 21*  --   GLUCOSE 96  < > 84  --   BUN 88*  < > 66*  --   CREATININE 11.79*  < > 9.71*  --   CALCIUM 8.5*  < > 7.5*  --   MG  --   --  2.6*  --   AST 33  --   --   --   ALT 24  --   --   --   ALKPHOS 84  --   --   --   BILITOT 0.8  --   --   --   < > = values in this interval not displayed. ------------------------------------------------------------------------------------------------------------------  Cardiac Enzymes  Recent Labs Lab 12/05/15 0422  TROPONINI 0.05*   ------------------------------------------------------------------------------------------------------------------  RADIOLOGY:  Mr Brain Wo Contrast  12/10/2015  CLINICAL DATA:  Altered mental status. EXAM: MRI HEAD WITHOUT CONTRAST TECHNIQUE:  Multiplanar, multiecho pulse sequences of the brain and surrounding structures were obtained without intravenous contrast. COMPARISON:  CT head 12/08/2015 FINDINGS: Image quality degraded by moderate motion Moderate atrophy.  Negative for hydrocephalus Negative for acute infarct. Mild chronic microvascular ischemic change in the white matter. Brainstem intact. Cerebellum and basal ganglia intact. Negative for hemorrhage or mass. No shift of the  midline structures. Normal skull base. Minimal mucosal edema paranasal sinuses. Extensive mastoid effusion bilaterally. IMPRESSION: Image quality degraded by moderate motion. No acute infarct.  Mild chronic microvascular ischemic changes. Bilateral mastoid sinus effusion. Electronically Signed   By: Franchot Gallo M.D.   On: 12/10/2015 15:52   Dg Chest Port 1 View  12/10/2015  CLINICAL DATA:  Dyspnea EXAM: PORTABLE CHEST 1 VIEW COMPARISON:  12/06/2015 chest radiograph. FINDINGS: Vascular stent overlies the left upper mediastinum. Stable cardiomediastinal silhouette with mild cardiomegaly. No pneumothorax. Mild hazy parahilar opacities appear slightly increased. Small left pleural effusion appears increased. Left basilar atelectasis appears increased. IMPRESSION: 1. Cardiomegaly with worsened hazy parahilar opacities, favor worsened mild congestive heart failure. 2. Increased small left pleural effusion with increased left basilar atelectasis. Electronically Signed   By: Ilona Sorrel M.D.   On: 12/10/2015 08:52    EKG:   Orders placed or performed during the hospital encounter of 12/04/15  . ED EKG  . ED EKG    ASSESSMENT AND PLAN:   * Acute encephalopathy due to unclear etiology. The ABGs were unremarkable, no CO2 retention, ammonia level is unremarkable Seems to be multifactorial due to depression, acute infection. Ammonia, B12, TSH levels were normal.  CT scan of the head without contrast was negative for acute intracranial process.Appreciated . Neurology input, metabolic encephalopathy was suggested.  Awaiting for psychiatry's consultation as well. Obtaining palliative care input as well. MRI of brain showed no acute infarct, chronic microvascular ischemic changes. Some better overall, able to answer 1 question, says that he has AV fistula site is painful and as there is a does not allow hemodialysis via AV fistula in the left upper extremity.    * Acute on chronic diastolic CHF with acute  hypoxic respiratory failure - off  Nonrebreather, on nasal cannulas, at 5 L. His saturations were in the 50s off the NONREBREATHER. Chest x-ray showed pulmonary edema.   Chest x-ray March 19th revealed  cardiomegaly, worsened perihilar opacities, likely worsening congestive heart failure, increased pleural effusion on the left. Patient is being continued on dialysis  * New onset Atrial fibrillation Of oral Cardizem due to hypotension.  Heart ratefluctuated between 90s to 100   * MSSA bacteremia - afebrile Blood cultures drawn at the dialysis center on 12/02/2015. cultures repeated in the hospital on 12/05/2015 Have MSSA.[name] recommends Ancef with hemodialysis for 6 weeks since negative blood cultures 12/08/2015  MRI of the lumbar spine revealed small fluid collection, unlikely abscess per surgery. Vascular surgery is consulted to remove her PermCath further from right groin  * Hyperkalemia with end-stage renal disease. Improved with Kayexalate, IV insulin and D50 In the emergency room Continue HD. Follow potassium levels closely.  * Hypotension,.Resolved, off medications  Holding labetalol and cardizem. Vasotec held due to hyperkalemia on admission. Stable blood pressure readings at present and restart blood pressure medications as tolerated.  * Severe depression Appreciate Dr. Weber Cooks seeing the patient. Now on Remeron.   * Chronic low back pain. MRI of lumbar spine did not show any acute abnormalities except of small cyst in paraspinal area, unlikely abscess per surgery, no intervention  was recommended  Due to encephalopathy opiates are placed on hold   * Diabetes mellitus Sliding scale due to unpredictable oral intake.  * DVT prophylaxis. Patient is on heparin subcutaneously.  *Hyponatremia, likely due to fluid overload, follow after hemodialysis   * Patient is incompetent to make decisions for medical care, patient's family, patient's brother stepped up and is willing  to make decisions in regards to medical issues, patient's brother needs to be cold for all medical needs.  I discussed patient's case with multiple family members yesterday, patient's mother refuses to be power of attorney for medical care, however, patient's brother stepped up and is willing to take discharge, he is to sign documentation provided for him in the hospital to become power of attorney for medical care All the records are reviewed and case discussed with Care Management/Social Workerr. Management plans discussed with the patient, mother and they are in agreement.  CODE STATUS: full code.  TOTAL CC TIME TAKING CARE OF THIS PATIENT: 40 minutes.   POSSIBLE D/C IN 3-4 DAYS, DEPENDING ON CLINICAL CONDITION.  Theodoro Grist M.D on 12/11/2015 at 2:45 PM  Between 7am to 6pm - Pager - [phone]  After 6pm go to [email or web address] - password [email or web address] [facility]  Office  [phone]  CC: Primary care physician; Elyse Jarvis, MD

## 2015-12-11 NOTE — Progress Notes (Signed)
Patient refused for left upper arm fistula to be used for dialysis.Patient stated "you not sticking my arm,thats why I got this thing in my leg,the doctor said I don;t have to be stuck if I dont want to because its my arm.".Unsure of what doctor patient referring to, but right femoral permcath was used.Patients fistula is viable and was last used on 12/06/2015,educated patient on importance of using fistula as opposed to CVC due to risk for infection,patient still adamant about not using his fistula site.Dr.Lateef made aware during his rounds.

## 2015-12-11 NOTE — Progress Notes (Addendum)
CSW met with patient. Patient is alert to self and place. Patient could not answer any of CSW's questions approprietly. CSW inquired about patient's discharge plans. Patient's answer was "yes". CSW wrote her questions on a sheet of paper about discharge plans and patient responded "yes" to each question. Patient stated he had somewhere to go at discharge but could not tell CSW where. CSW received verbal permission to speak to patient's mother [name]) and he said "yes".   Attempted to call his mother [name]. Left voicemail. CSW reviewed MD [place]'s note (12/11/15) stating that patient's brother is willing to be patient's HCPOA. Called patient's brother- [name]. He reports he's agreeable to SNF placement for patient and gave CSW verbal permission to send patient's SNF referral to SNFs in [place]. Per [name] his mother is "up in age" and "does not understand". He stated she becomes overwhelmed and confused by questions. FL2 completed and faxedto SNFs in [place]. Awaiting bed offers. CSW will continue to follow and assist.   Ernest Pine, MSW, [facility] Work Department [phone]

## 2015-12-11 NOTE — Progress Notes (Signed)
Hemodialysis start 

## 2015-12-11 NOTE — Progress Notes (Signed)
Pre-hd tx 

## 2015-12-11 NOTE — Consult Note (Signed)
Pharmacy Antibiotic Note  Noah Lewis is a 65 y.o. male admitted on 12/04/2015 with bacteremia.  Pharmacy has been consulted for cefazolin dosing. Pt is an ESRD pt on HD MWF  Plan: Patient currently on vancomycin. Pt has a penicillin allergy, however reaction is known. After discussion with [name] decided it is ok to proceed with ancef. Cefazolin 2g AFTER HD on MWF    Height: 5\' 9"  (175.3 cm) Weight: 163 lb 9.3 oz (74.2 kg) IBW/kg (Calculated) : 70.7  Temp (24hrs), Avg:98 F (36.7 C), Min:97.7 F (36.5 C), Max:98.2 F (36.8 C)   Recent Labs Lab 12/04/15 1544 12/05/15 0422 12/05/15 1507  12/06/15 1309 12/07/15 0831 12/08/15 0252 12/09/15 0627 12/10/15 0702 12/11/15 0726  WBC 8.5 7.8  --   < > 11.0* 12.9* 13.6* 12.6* 14.6* 12.3*  CREATININE 11.79* 12.44* 8.39*  --  9.94*  --   --   --  9.71*  --   VANCORANDOM  --   --   --   --   --   --   --   --   --  16  < > = values in this interval not displayed.  Estimated Creatinine Clearance: 7.7 mL/min (by C-G formula based on Cr of 9.71).    Allergies  Allergen Reactions  . Penicillins Other (See Comments)    Reaction:  Unknown     Antimicrobials this admission: Vancomycin 3/14 >>  Meropenem 3/15 >> 3/16  Dose adjustments this admission:   Microbiology results: 3/14 BCx: staph aureus MSSA Blood cx from [facility] dialysis: GPC in clusters in 2/2 per MD  3/13 MRSA PCR: negative 3/17 Bcx- no growth 3 days  Thank you for allowing pharmacy to be a part of this patient's care.  Ramond Dial 12/11/2015 2:44 PM

## 2015-12-11 NOTE — Progress Notes (Signed)
Hemodialysis completed. 

## 2015-12-11 NOTE — Consult Note (Signed)
Pharmacy Antibiotic Note  Noah Lewis is a 65 y.o. male admitted on 12/04/2015 with bacteremia.  Pharmacy has been consulted for vancomycin dosing.  Plan: Per HD trough =16 Continue current dose of 750mg  after dialysis. Recheck trough weekly if pt still on vancomycin  Height: 5\' 9"  (175.3 cm) Weight: 168 lb (76.204 kg) IBW/kg (Calculated) : 70.7  Temp (24hrs), Avg:97.9 F (36.6 C), Min:97.5 F (36.4 C), Max:98.2 F (36.8 C)   Recent Labs Lab 12/04/15 1544 12/05/15 0422 12/05/15 1507  12/06/15 1309 12/07/15 0831 12/08/15 0252 12/09/15 0627 12/10/15 0702 12/11/15 0726  WBC 8.5 7.8  --   < > 11.0* 12.9* 13.6* 12.6* 14.6* 12.3*  CREATININE 11.79* 12.44* 8.39*  --  9.94*  --   --   --  9.71*  --   VANCORANDOM  --   --   --   --   --   --   --   --   --  16  < > = values in this interval not displayed.  Estimated Creatinine Clearance: 7.7 mL/min (by C-G formula based on Cr of 9.71).    Allergies  Allergen Reactions  . Penicillins Other (See Comments)    Reaction:  Unknown     Antimicrobials this admission: Vancomycin 3/14 >>  Meropenem 3/15 >> 3/16  Dose adjustments this admission:   Microbiology results: 3/14 BCx: staph aureus  Blood cx from Noah Lewis dialysis: GPC in clusters in 2/2 per MD  3/13 MRSA PCR: negative 3/17 Bcx- no growth 3 days  Thank you for allowing pharmacy to be a part of this patient's care.  Noah Lewis 12/11/2015 8:27 AM

## 2015-12-11 NOTE — Progress Notes (Signed)
[facility] INFECTIOUS DISEASE PROGRESS NOTE Date of Admission:  12/04/2015     ID: Trung Ardito is a 65 y.o. male with  ESRD, MSSA bacteremia and back pain Active Problems:   Bipolar depression ([place])   A-fib ([place])   Bacteremia   Back pain   Subjective: Just back from HD, more awake   ROS  Unable to obtain   Medications:  Antibiotics Given (last 72 hours)    Date/Time Action Medication Dose Rate   12/11/15 1158 Given   vancomycin (VANCOCIN) IVPB 750 mg/150 ml premix 750 mg 150 mL/hr     . aspirin EC  325 mg Oral Daily  . chlorhexidine  15 mL Mouth Rinse BID  . epoetin (EPOGEN/PROCRIT) injection  4,000 Units Intravenous Q M,W,F-HD  . heparin subcutaneous  5,000 Units Subcutaneous 3 times per day  . insulin aspart  0-5 Units Subcutaneous QHS  . insulin aspart  0-9 Units Subcutaneous TID WC  . latanoprost  1 drop Both Eyes QHS  . mirtazapine  15 mg Oral QHS  . sevelamer carbonate  2,400 mg Oral TID WC  . sodium chloride flush  3 mL Intravenous Q12H  . vancomycin  750 mg Intravenous Q M,W,F-HD    Objective: Vital signs in last 24 hours: Temp:  [97.9 F (36.6 C)-98.2 F (36.8 C)] 98 F (36.7 C) (03/20 1305) Pulse Rate:  [91-100] 93 (03/20 1305) Resp:  [15-26] 26 (03/20 1305) BP: (95-156)/(76-107) 121/98 mmHg (03/20 1305) SpO2:  [95 %-100 %] 100 % (03/20 1305) Weight:  [74.2 kg (163 lb 9.3 oz)-76.204 kg (168 lb)] 74.2 kg (163 lb 9.3 oz) (03/20 1305) Constitutional: lying in bed HENT: muddy sclera Mouth/Throat: Oropharynx is clear and dry. No oropharyngeal exudate.  Cardiovascular: Distant, No murmur heard.  Pulmonary/Chest: Effort normal and breath sounds normal. No respiratory distress. He has no wheezes.  Abdominal: Soft. Obese Bowel sounds are normal. He exhibits no distension. There is no tenderness.  Lymphadenopathy: He has no cervical adenopathy.  Neurological: lethargic Skin: Skin is warm and dry. LUE with chronic edema, L avf site with no  tenderness or erythema noted R thigh HD cath Psychiatric: awake but min interactive  Lab Results  Recent Labs  12/10/15 0702 12/11/15 0726  WBC 14.6* 12.3*  HGB 9.7* 8.8*  HCT 29.2* 26.6*  NA 130* 129*  K 4.6  --   CL 94*  --   CO2 21*  --   BUN 66*  --   CREATININE 9.71*  --     Microbiology: Results for orders placed or performed during the hospital encounter of 12/04/15  MRSA PCR Screening     Status: None   Collection Time: 12/04/15 10:40 PM  Result Value Ref Range Status   MRSA by PCR NEGATIVE NEGATIVE Final    Comment:        The GeneXpert MRSA Assay (FDA approved for NASAL specimens only), is one component of a comprehensive MRSA colonization surveillance program. It is not intended to diagnose MRSA infection nor to guide or monitor treatment for MRSA infections.   CULTURE, BLOOD (ROUTINE X 2) w Reflex to PCR ID Panel     Status: None   Collection Time: 12/05/15 10:00 PM  Result Value Ref Range Status   Specimen Description BLOOD RIGHT ANTECUBITAL  Final   Special Requests BOTTLES DRAWN AEROBIC AND ANAEROBIC 5ML  Final   Culture  Setup Time   Final    GRAM POSITIVE COCCI ANAEROBIC BOTTLE ONLY CRITICAL RESULT CALLED TO,  READ BACK BY AND VERIFIED WITH: MELISSA [place] 12/07/15 1458    Culture STAPHYLOCOCCUS AUREUS ANAEROBIC BOTTLE ONLY   Final   Report Status 12/11/2015 FINAL  Final   Organism ID, Bacteria STAPHYLOCOCCUS AUREUS  Final      Susceptibility   Staphylococcus aureus - MIC*    CIPROFLOXACIN <=0.5 SENSITIVE Sensitive     ERYTHROMYCIN 4 RESISTANT Resistant     GENTAMICIN <=0.5 SENSITIVE Sensitive     OXACILLIN <=0.25 SENSITIVE Sensitive     TETRACYCLINE <=1 SENSITIVE Sensitive     VANCOMYCIN 1 SENSITIVE Sensitive     TRIMETH/SULFA <=10 SENSITIVE Sensitive     CLINDAMYCIN RESISTANT Resistant     RIFAMPIN <=0.5 SENSITIVE Sensitive     Inducible Clindamycin Value in next row Resistant      POSITIVEINDUCIBLE CLINDAMYCIN RESISTANCE - A positive  ICR test is indicative of inducible resistance to macrolides, lincosamides, and type B streptogramin.  This isolate is presumed to be resistant to Clindamycin, however, Clindamycin may still be effective in some patients.     * STAPHYLOCOCCUS AUREUS  Blood Culture ID Panel (Reflexed)     Status: Abnormal   Collection Time: 12/05/15 10:00 PM  Result Value Ref Range Status   Enterococcus species NOT DETECTED NOT DETECTED Final   Vancomycin resistance NOT DETECTED NOT DETECTED Final   Listeria monocytogenes NOT DETECTED NOT DETECTED Final   Staphylococcus species DETECTED (A) NOT DETECTED Final    Comment: CRITICAL RESULT CALLED TO, READ BACK BY AND VERIFIED WITH: MELISSA MECCIA 12/07/15 1458 MLM    Staphylococcus aureus DETECTED (A) NOT DETECTED Final    Comment: CRITICAL RESULT CALLED TO, READ BACK BY AND VERIFIED WITH: MELISSA MECCIA 12/07/15 1458 MLM    Methicillin resistance NOT DETECTED NOT DETECTED Final   Streptococcus species NOT DETECTED NOT DETECTED Final   Streptococcus agalactiae NOT DETECTED NOT DETECTED Final   Streptococcus pneumoniae NOT DETECTED NOT DETECTED Final   Streptococcus pyogenes NOT DETECTED NOT DETECTED Final   Acinetobacter baumannii NOT DETECTED NOT DETECTED Final   Enterobacteriaceae species NOT DETECTED NOT DETECTED Final   Enterobacter cloacae complex NOT DETECTED NOT DETECTED Final   Escherichia coli NOT DETECTED NOT DETECTED Final   Klebsiella oxytoca NOT DETECTED NOT DETECTED Final   Klebsiella pneumoniae NOT DETECTED NOT DETECTED Final   Proteus species NOT DETECTED NOT DETECTED Final   Serratia marcescens NOT DETECTED NOT DETECTED Final   Carbapenem resistance NOT DETECTED NOT DETECTED Final   Haemophilus influenzae NOT DETECTED NOT DETECTED Final   Neisseria meningitidis NOT DETECTED NOT DETECTED Final   Pseudomonas aeruginosa NOT DETECTED NOT DETECTED Final   Candida albicans NOT DETECTED NOT DETECTED Final   Candida glabrata NOT DETECTED NOT  DETECTED Final   Candida krusei NOT DETECTED NOT DETECTED Final   Candida parapsilosis NOT DETECTED NOT DETECTED Final   Candida tropicalis NOT DETECTED NOT DETECTED Final  CULTURE, BLOOD (ROUTINE X 2) w Reflex to PCR ID Panel     Status: None   Collection Time: 12/05/15 10:05 PM  Result Value Ref Range Status   Specimen Description BLOOD BLOOD RIGHT FOREARM  Final   Special Requests BOTTLES DRAWN AEROBIC AND ANAEROBIC 5ML  Final   Culture NO GROWTH 5 DAYS  Final   Report Status 12/10/2015 FINAL  Final  CULTURE, BLOOD (ROUTINE X 2) w Reflex to PCR ID Panel     Status: None (Preliminary result)   Collection Time: 12/08/15  2:52 PM  Result Value Ref Range Status  Specimen Description BLOOD RIGHT HAND  Final   Special Requests   Final    BOTTLES DRAWN AEROBIC AND ANAEROBIC 8CC AERO 6CC ANA   Culture NO GROWTH 3 DAYS  Final   Report Status PENDING  Incomplete  CULTURE, BLOOD (ROUTINE X 2) w Reflex to PCR ID Panel     Status: None (Preliminary result)   Collection Time: 12/08/15  6:19 PM  Result Value Ref Range Status   Specimen Description BLOOD RIGHT WRIST  Final   Special Requests   Final    BOTTLES DRAWN AEROBIC AND ANAEROBIC 11CCAERO,8CCANA   Culture NO GROWTH 3 DAYS  Final   Report Status PENDING  Incomplete    Studies/Results: Mr Brain Wo Contrast  12/10/2015  CLINICAL DATA:  Altered mental status. EXAM: MRI HEAD WITHOUT CONTRAST TECHNIQUE: Multiplanar, multiecho pulse sequences of the brain and surrounding structures were obtained without intravenous contrast. COMPARISON:  CT head 12/08/2015 FINDINGS: Image quality degraded by moderate motion Moderate atrophy.  Negative for hydrocephalus Negative for acute infarct. Mild chronic microvascular ischemic change in the white matter. Brainstem intact. Cerebellum and basal ganglia intact. Negative for hemorrhage or mass. No shift of the midline structures. Normal skull base. Minimal mucosal edema paranasal sinuses. Extensive mastoid  effusion bilaterally. IMPRESSION: Image quality degraded by moderate motion. No acute infarct.  Mild chronic microvascular ischemic changes. Bilateral mastoid sinus effusion. Electronically Signed   By: Franchot Gallo M.D.   On: 12/10/2015 15:52   Dg Chest Port 1 View  12/10/2015  CLINICAL DATA:  Dyspnea EXAM: PORTABLE CHEST 1 VIEW COMPARISON:  12/06/2015 chest radiograph. FINDINGS: Vascular stent overlies the left upper mediastinum. Stable cardiomediastinal silhouette with mild cardiomegaly. No pneumothorax. Mild hazy parahilar opacities appear slightly increased. Small left pleural effusion appears increased. Left basilar atelectasis appears increased. IMPRESSION: 1. Cardiomegaly with worsened hazy parahilar opacities, favor worsened mild congestive heart failure. 2. Increased small left pleural effusion with increased left basilar atelectasis. Electronically Signed   By: Ilona Sorrel M.D.   On: 12/10/2015 08:52    Assessment/Plan: [place] Welden is a 65 y.o. male with ESRD admitted after refusing HD and with back pain and now with Staph aureus bacteremia (CX at HD and + at admission). FU bcx sent 3/17negative. TTE shows no vegetation. Has been on vanco and meropenem  Recommendations Continue ancef  MRI L - spine neg for osteo discitis - had a small fluid collection but does not appear to be infected Will need to have his PermaCath removed as well  Will plan on a 6 week course of ancef at HD Thank you very much for the consult. Will follow with you.  [place], [place]   12/11/2015, 1:34 PM

## 2015-12-11 NOTE — Progress Notes (Signed)
Nutrition Follow-up    INTERVENTION:   Coordination of Care: appetite appears improved with pt eating mostly 75-100% of meal trays at present, discsused with MD Megan Salon, calorie count discontinued Meals and Snacks: Cater to patient preferences Medical Food Supplement Therapy: continue Art therapist, Magic Cup supplement   NUTRITION DIAGNOSIS:   Inadequate oral intake related to acute illness as evidenced by meal completion < 25%. Improving  GOAL:   Patient will meet greater than or equal to 90% of their needs   MONITOR:   PO intake, Supplement acceptance, Labs, Weight trends, I & O's  REASON FOR ASSESSMENT:    (Dialysis pt)    ASSESSMENT:    Pt mental status improved, alert, feeding self lunch on visit today; last HD received this AM  Diet Order:  Diet regular Room service appropriate?: Yes with Assist; Fluid consistency:: Thin   Energy Intake: Calorie Count: pt with poor intake on Saturday, refused Breakfast, down for HD at [facility] but ate a little for dinner. PO intake much improved since then. Sunday pt ate eggs, bacon, grits, white bread plus coffee, juice and milk at breakfast; entire open faced [place] sandwich, fruit cup and mashed potatoes with magic cup and diet ginger ale for lunch and  an entire hamburger, fruit cup and most of potatoes at dinner. Pt ate well again this AM with 100% of pancakes, eggs, and bacon with juice. Appetite appears improved. Per tray tickets from calorie count, pt did not really like the supplements but did eat 1 magic cup.  On visit at lunch today, pt actually feeding himself. Per Junious Dresser RN, this is an improvement as pt has required feeding assistance since admission. Pt eating mashed potatoes and [place]. On visit today, pt reports he does like the [employer], he also drank some of the [facility] and reported he liked this as well   Recent Labs Lab 12/05/15 1507 12/06/15 1309 12/10/15 0702 12/11/15 0726  NA 133* 131* 130* 129*  K 3.8  4.7 4.6  --   CL 95* 93* 94*  --   CO2 26 22 21*  --   BUN 63* 69* 66*  --   CREATININE 8.39* 9.94* 9.71*  --   CALCIUM 8.1* 7.4* 7.5*  --   MG  --   --  2.6*  --   PHOS 3.6  --   --   --   GLUCOSE 107* 112* 84  --     Glucose Profile:  Recent Labs  12/10/15 1650 12/10/15 2138 12/11/15 0733  GLUCAP 106* 89 95   Meds: ss novolog, remeron, renvela  Height:   Ht Readings from Last 1 Encounters:  12/07/15 5\' 9"  (1.753 m)    Weight:   Wt Readings from Last 1 Encounters:  12/11/15 167 lb 15.9 oz (76.2 kg)   BMI:  Body mass index is 24.8 kg/(m^2).  Estimated Nutritional Needs:   Kcal:  BEE: 1550kcals, TEE: (IF 1.1-1.3)(AF 1.2) [id number]  Protein:  93-116g protein (1.2-1.5g/kg)  Fluid:  UOP+1L  EDUCATION NEEDS:   Education needs no appropriate at this time   Ball, [place], LDN [phone] Pager  [phone] Weekend/On-Call Pager

## 2015-12-11 NOTE — Consult Note (Signed)
  Psychiatry: Follow-up for this 65 year old man with a history of mood disorder currently in the hospital with multiple medical problems related to dialysis and cardiac problems. Attempted to interview the patient. I even enlisted assistance from the RN working with him to try and communicate with him. With much verbal stimuli and slight shaking weekly get the patient to open his eyes for a couple seconds and shake his head no but he wouldn't communicate anymore than that. He indicated that he had no interest in trying to talk with me. Nursing tells me that he is intermittently and partially cooperative. Most likely does have a depressive component to this. I will continue to try to follow-up with him. At least he does not appear to be actively attempting to harm himself and has been cooperative with dialysis for the most part. Hepatic failure makes it a little difficult to decide on appropriate antidepressant medicine. He seems to be tolerating the 15 mg of mirtazapine. I would prefer not to go up on the dose right now. Tried to give some supportive counseling to the patient and I will continue to follow-up as needed.

## 2015-12-11 NOTE — Consult Note (Signed)
[facility] Vascular Consult Note  MRN : [id number]  Noah Lewis is a 65 y.o. ([birth year]) male who presents with chief complaint of  Chief Complaint  Patient presents with  . Back Pain  .  History of Present Illness: I am asked by Dr. Holley Raring from nephrology to see the patient to consider removal of his PermCath. He is admitted with bacteremia. He had a PermCath placed several weeks ago in the right groin. He has a difficult access situation. He provides very little history due to his poor mental status but with positive blood cultures and a catheter in place this was felt to be the most likely source. We are asked to remove the catheter  Current Facility-Administered Medications  Medication Dose Route Frequency Provider Last Rate Last Dose  . 0.9 %  sodium chloride infusion  250 mL Intravenous PRN Srikar Sudini, MD 10 mL/hr at 12/06/15 0700 250 mL at 12/06/15 0700  . acetaminophen (TYLENOL) tablet 650 mg  650 mg Oral Q6H PRN Hillary Bow, MD       Or  . acetaminophen (TYLENOL) suppository 650 mg  650 mg Rectal Q6H PRN Hillary Bow, MD      . aspirin EC tablet 325 mg  325 mg Oral Daily Colleen Can, MD   325 mg at 12/09/15 1600  . ceFAZolin (ANCEF) IVPB 2 g/50 mL premix  2 g Intravenous Once Theodoro Grist, MD      . Derrill Memo ON 12/13/2015] ceFAZolin (ANCEF) IVPB 2 g/50 mL premix  2 g Intravenous Once per day on Mon Wed Fri Theodoro Grist, MD      . chlorhexidine (PERIDEX) 0.12 % solution 15 mL  15 mL Mouth Rinse BID Lance Coon, MD   15 mL at 12/07/15 2159  . epoetin alfa (EPOGEN,PROCRIT) injection 4,000 Units  4,000 Units Intravenous Q M,W,F-HD Murlean Iba, MD   4,000 Units at 12/11/15 1159  . heparin injection 5,000 Units  5,000 Units Subcutaneous 3 times per day Theodoro Grist, MD   5,000 Units at 12/10/15 2205  . insulin aspart (novoLOG) injection 0-5 Units  0-5 Units Subcutaneous QHS Hillary Bow, MD   0 Units at 12/04/15 2258  . insulin aspart  (novoLOG) injection 0-9 Units  0-9 Units Subcutaneous TID WC Hillary Bow, MD   1 Units at 12/05/15 0844  . ipratropium-albuterol (DUONEB) 0.5-2.5 (3) MG/3ML nebulizer solution 3 mL  3 mL Nebulization Q6H PRN Srikar Sudini, MD      . labetalol (NORMODYNE,TRANDATE) injection 10 mg  10 mg Intravenous Q2H PRN Murlean Iba, MD   10 mg at 12/06/15 1306  . latanoprost (XALATAN) 0.005 % ophthalmic solution 1 drop  1 drop Both Eyes QHS Hillary Bow, MD   1 drop at 12/09/15 2226  . lidocaine-prilocaine (EMLA) cream   Topical PRN Munsoor Lateef, MD      . mirtazapine (REMERON SOL-TAB) disintegrating tablet 15 mg  15 mg Oral QHS Gonzella Lex, MD   15 mg at 12/08/15 2142  . ondansetron (ZOFRAN) tablet 4 mg  4 mg Oral Q6H PRN Hillary Bow, MD       Or  . ondansetron (ZOFRAN) injection 4 mg  4 mg Intravenous Q6H PRN Hillary Bow, MD   4 mg at 12/05/15 0853  . polyethylene glycol (MIRALAX / GLYCOLAX) packet 17 g  17 g Oral Daily PRN Srikar Sudini, MD      . sevelamer carbonate (RENVELA) tablet 2,400 mg  2,400 mg Oral TID WC Srikar Sudini,  MD   2,400 mg at 12/09/15 0821  . sodium chloride flush (NS) 0.9 % injection 3 mL  3 mL Intravenous Q12H Hillary Bow, MD   3 mL at 12/11/15 1000    Past Medical History  Diagnosis Date  . Cancer ([place])   . Arthritis   . Renal disorder   . Hypertension   . Diabetes mellitus without complication ([place])   . Asthma   . COPD (chronic obstructive pulmonary disease) ([place])   . Coronary artery disease     Past Surgical History  Procedure Laterality Date  . No past surgeries    . Insertion of dialysis catheter    . Insertion of dialysis catheter N/A 05/29/2015    Procedure: INSERTION OF DIALYSIS CATHETER;  Surgeon: Angelia Mould, MD;  Location: [facility];  Service: Vascular;  Laterality: N/A;  . Peripheral vascular catheterization N/A 10/25/2015    Procedure: Dialysis/Perma Catheter Insertion;  Surgeon: Katha Cabal, MD;  Location: [facility];   Service: Cardiovascular;  Laterality: N/A;    Social History Social History  Substance Use Topics  . Smoking status: Never Smoker   . Smokeless tobacco: Never Used  . Alcohol Use: No  This is obtained from the previous medical record as he is unable to provide much history  Family History Family History  Problem Relation Age of Onset  . Hypertension Other   . Diabetes Other   No reported history of bleeding disorders, clotting disorders, or aneurysms but difficult to obtain this from the patient  Allergies  Allergen Reactions  . Penicillins Other (See Comments)    Reaction:  Unknown      REVIEW OF SYSTEMS (Negative unless checked)  Unable to accurately obtain a review of systems given the patient's poor mental status and unable to provide accurate history  Physical Examination  Filed Vitals:   12/11/15 1230 12/11/15 1300 12/11/15 1305 12/11/15 1352  BP: 134/88 151/96 121/98 120/81  Pulse: 96 95 93 103  Temp:   98 F (36.7 C) 97.7 F (36.5 C)  TempSrc:   Oral Oral  Resp: 24 21 26 19   Height:      Weight:   74.2 kg (163 lb 9.3 oz)   SpO2: 97% 100% 100% 98%   Body mass index is 24.15 kg/(m^2). Gen:  WD/WN, NAD Head: [place]/AT, No temporalis wasting. Prominent temp pulse not noted. Ear/Nose/Throat: Hearing grossly intact, nares w/o erythema or drainage, oropharynx w/o Erythema/Exudate Eyes: PERRLA, EOMI.  Neck: Supple, no nuchal rigidity.  No JVD.  Pulmonary:  Good air movement, equal bilaterally.  Cardiac: RRR, normal S1, S2, Vascular: PermCath in right groin without clear erythema or drainage around it. Vessel Right Left  Radial Palpable Palpable                                   Gastrointestinal: soft, non-tender/non-distended. No guarding/reflex. No masses, surgical incisions, or scars. Musculoskeletal: M/S 5/5 throughout.  Extremities without ischemic changes.  No deformity or atrophy. No edema. Neurologic: Lethargic. Appears to move all 4 extremities  but unclear if he has any deficits. Not really following commands. Psychiatric: Judgment Is poor and the patient barely converses. Difficult to gain any history from him Dermatologic: No rashes or ulcers noted.  No cellulitis or open wounds. Lymph : No Cervical, Axillary, or Inguinal lymphadenopathy.       CBC Lab Results  Component Value Date   WBC  12.3* 12/11/2015   HGB 8.8* 12/11/2015   HCT 26.6* 12/11/2015   MCV 85.1 12/11/2015   PLT 234 12/11/2015    BMET    Component Value Date/Time   NA 129* 12/11/2015 0726   K 4.6 12/10/2015 0702   CL 94* 12/10/2015 0702   CO2 21* 12/10/2015 0702   GLUCOSE 84 12/10/2015 0702   BUN 66* 12/10/2015 0702   CREATININE 9.71* 12/10/2015 0702   CALCIUM 7.5* 12/10/2015 0702   GFRNONAA 5* 12/10/2015 0702   GFRAA 6* 12/10/2015 0702   Estimated Creatinine Clearance: 7.7 mL/min (by C-G formula based on Cr of 9.71).  COAG Lab Results  Component Value Date   INR 1.18 12/08/2015   INR 1.22 12/07/2015   INR 1.21 12/06/2015    Radiology Dg Chest 2 View  12/04/2015  CLINICAL DATA:  Shortness of breath.  Chronic renal failure EXAM: CHEST  2 VIEW COMPARISON:  September 20, 2015 FINDINGS: Dialysis catheter placed from a femoral location has its tip in the right atrium. No pneumothorax. There is no edema or consolidation. Heart is mildly enlarged with pulmonary vascularity within normal limits. No adenopathy. There is a left subclavian stent. There is degenerative change in the thoracic spine. IMPRESSION: No edema or consolidation. Cardiomegaly is stable. Central catheter tip is in the right atrium. No pneumothorax. Electronically Signed   By: Lowella Grip III M.D.   On: 12/04/2015 14:55   Dg Lumbar Spine 2-3 Views  12/04/2015  CLINICAL DATA:  Low back pain for approximately 2 days. No known injury. Initial encounter. EXAM: LUMBAR SPINE - 2-3 VIEW COMPARISON:  CT abdomen and pelvis 09/20/2015. FINDINGS: Vertebral body height and alignment are  maintained. Anterior endplate spurring lower thoracic spine is noted. Intervertebral disc space height is normal. Dialysis catheter from a right groin approach is noted. IMPRESSION: No acute abnormality.  Mild appearing lower thoracic spondylosis. Electronically Signed   By: Inge Rise M.D.   On: 12/04/2015 14:54   Ct Head Wo Contrast  12/08/2015  CLINICAL DATA:  Encephalopathy, shortness of Breath EXAM: CT HEAD WITHOUT CONTRAST TECHNIQUE: Contiguous axial images were obtained from the base of the skull through the vertex without intravenous contrast. COMPARISON:  02/18/2013 FINDINGS: Brain: Mild parenchymal atrophy. Patchy areas of hypoattenuation in deep and periventricular white matter bilaterally. Negative for acute intracranial hemorrhage, mass lesion, acute infarction, midline shift, or mass-effect. Acute infarct may be inapparent on noncontrast CT. Ventricles and sulci symmetric. Vascular: No hyperdense vessel or unexpected calcification. Atherosclerotic and physiologic intracranial calcifications. Skull: Negative for fracture or focal lesion. Sinuses/Orbits: Interval opacification of mastoid air cells bilaterally. Visualized paranasal sinuses are normally developed and well aerated. Orbits unremarkable. Other: None. IMPRESSION: 1. Negative for bleed or other acute intracranial process. 2. Atrophy and nonspecific white matter changes. 3. Interval opacification of bilateral mastoid air cells. Electronically Signed   By: Lucrezia Europe M.D.   On: 12/08/2015 16:08   Mr Brain Wo Contrast  12/10/2015  CLINICAL DATA:  Altered mental status. EXAM: MRI HEAD WITHOUT CONTRAST TECHNIQUE: Multiplanar, multiecho pulse sequences of the brain and surrounding structures were obtained without intravenous contrast. COMPARISON:  CT head 12/08/2015 FINDINGS: Image quality degraded by moderate motion Moderate atrophy.  Negative for hydrocephalus Negative for acute infarct. Mild chronic microvascular ischemic change in the  white matter. Brainstem intact. Cerebellum and basal ganglia intact. Negative for hemorrhage or mass. No shift of the midline structures. Normal skull base. Minimal mucosal edema paranasal sinuses. Extensive mastoid effusion bilaterally. IMPRESSION: Image quality  degraded by moderate motion. No acute infarct.  Mild chronic microvascular ischemic changes. Bilateral mastoid sinus effusion. Electronically Signed   By: Franchot Gallo M.D.   On: 12/10/2015 15:52   Mr Lumbar Spine Wo Contrast  12/09/2015  CLINICAL DATA:  Bacteremia. Altered mental state. Leukocytosis. Dialysis patient EXAM: MRI LUMBAR SPINE WITHOUT CONTRAST TECHNIQUE: Multiplanar, multisequence MR imaging of the lumbar spine was performed. No intravenous contrast was administered. COMPARISON:  Lumbar radiographs 12/04/2015 FINDINGS: Normal lumbar alignment. Negative for fracture or mass lesion. Conus medullaris normal and terminates at L1. 8 x 10 mm fluid collection to the left of the L5 spinous process. No associated bony abnormality. This could represent a small soft tissue abscess. This does not appear to connect with the facet or disc space. No epidural abscess. No evidence of discitis or osteomyelitis. Unfortunately, the patient cannot have intravenous contrast due to dialysis. L1-2: Mild disc degeneration and mild facet degeneration without spinal stenosis L2-3:  Negative L3-4: Mild disc bulging and facet degeneration without significant spinal stenosis L4-5: Mild disc and facet degeneration. Mild spinal stenosis. No disc protrusion. L5-S1: Moderate to advanced disc degeneration with disc space narrowing. Endplate spurring is present. There is subarticular and foraminal stenosis bilaterally of a moderate degree due to spurring. Mild facet degeneration. IMPRESSION: 8 x 10 mm fluid collection in the soft tissues to the left of the L5 spinous process. This does not appear to connect with the epidural space or facet joint. This may represent a small  soft tissue abscess given the history. Alternately, this could represent a synovial cyst of the facet joint or a simple fluid collection. No evidence of discitis or osteomyelitis Lumbar degenerative change as above. Electronically Signed   By: Franchot Gallo M.D.   On: 12/09/2015 11:12   Dg Chest Port 1 View  12/10/2015  CLINICAL DATA:  Dyspnea EXAM: PORTABLE CHEST 1 VIEW COMPARISON:  12/06/2015 chest radiograph. FINDINGS: Vascular stent overlies the left upper mediastinum. Stable cardiomediastinal silhouette with mild cardiomegaly. No pneumothorax. Mild hazy parahilar opacities appear slightly increased. Small left pleural effusion appears increased. Left basilar atelectasis appears increased. IMPRESSION: 1. Cardiomegaly with worsened hazy parahilar opacities, favor worsened mild congestive heart failure. 2. Increased small left pleural effusion with increased left basilar atelectasis. Electronically Signed   By: Ilona Sorrel M.D.   On: 12/10/2015 08:52   Dg Chest Port 1 View  12/06/2015  CLINICAL DATA:  Hypoxia, history hypertension, diabetes mellitus, asthma, COPD, end-stage renal disease on dialysis, coronary artery disease EXAM: PORTABLE CHEST 1 VIEW COMPARISON:  Portable exam 0957 hours compared to 12/04/2015 FINDINGS: Dialysis catheter via infra diaphragmatic approach with tip projecting over RIGHT atrium. Wall stent identified at the LEFT brachiocephalic vein. Enlargement of cardiac silhouette with pulmonary vascular congestion. Mediastinal contours normal. Question mild atelectasis or infiltrate in retrocardiac LEFT lower lobe. Remaining lungs clear. No pleural effusion or pneumothorax. IMPRESSION: Enlargement of cardiac silhouette with pulmonary vascular congestion. Question mild atelectasis or infiltrate in retrocardiac LEFT lower lobe. Electronically Signed   By: Lavonia Dana M.D.   On: 12/06/2015 10:35      Assessment/Plan 1. Bacteremia. MSSA. On antibiotics. PermCath is the likely source but  even if it is not the primary source must be removed in the face of bacteremia. 2. ESRD. PermCath to be removed as above. 3. Complication of dialysis access. PermCath the likely source of his bacteremia.   DEW,JASON, MD  12/11/2015 5:58 PM

## 2015-12-11 NOTE — Progress Notes (Signed)
Called MD lateef about fem perm cath and its removal. Stated that it was a vascular surgery and could not be removed by RNs.

## 2015-12-11 NOTE — Progress Notes (Signed)
Telephone consent was obtained from Noah Lewis to remove permcath in groin. More alert. HD today. 6 L of oxygen. A fib. Pt has not showed any signs of pain. PO intake improved calorie count was d/c. Brother was updated of plan of care. Pt has no further concerns at this time.

## 2015-12-12 LAB — GLUCOSE, CAPILLARY
GLUCOSE-CAPILLARY: 123 mg/dL — AB (ref 65–99)
GLUCOSE-CAPILLARY: 137 mg/dL — AB (ref 65–99)
Glucose-Capillary: 92 mg/dL (ref 65–99)
Glucose-Capillary: 108 mg/dL — ABNORMAL HIGH (ref 65–99)

## 2015-12-12 LAB — POTASSIUM: Potassium: 4.1 mmol/L (ref 3.5–5.1)

## 2015-12-12 LAB — MAGNESIUM: Magnesium: 2.3 mg/dL (ref 1.7–2.4)

## 2015-12-12 MED ORDER — AMIODARONE HCL 200 MG PO TABS
400.0000 mg | ORAL_TABLET | Freq: Two times a day (BID) | ORAL | Status: DC
  Administered 2015-12-12 – 2015-12-15 (×6): 400 mg via ORAL
  Filled 2015-12-12 (×7): qty 2

## 2015-12-12 MED ORDER — METOPROLOL TARTRATE 25 MG PO TABS
25.0000 mg | ORAL_TABLET | Freq: Two times a day (BID) | ORAL | Status: DC
  Administered 2015-12-12 – 2015-12-15 (×6): 25 mg via ORAL
  Filled 2015-12-12 (×7): qty 1

## 2015-12-12 NOTE — Progress Notes (Signed)
Noah Lewis is a 65 y.o. male  OX:[id number]  Primary Cardiologist: Neoma Laming Reason for Consultation: Noah Lewis  HPI: 65 year old African-American male male with a past medical history of renal failure on dialysis COPD coronary artery disease and atrial fibrillation presented to the emergency room with confusion and shortness of breath and was found to have CHF. Echocardiogram showed ejection fraction 50-55%. He had dialysis and got better to some extent and his ventricular rate of A. fib is not improved. I signed off the patient last week but had a run of nonsustained V. tach and was called back to see the patient.   Review of Systems: Patient is complaining of rib cage chest pain   Past Medical History  Diagnosis Date  . Cancer ([place])   . Arthritis   . Renal disorder   . Hypertension   . Diabetes mellitus without complication ([place])   . Asthma   . COPD (chronic obstructive pulmonary disease) ([place])   . Coronary artery disease     Medications Prior to Admission  Medication Sig Dispense Refill  . acetaminophen (TYLENOL) 325 MG tablet Take 650 mg by mouth every 6 (six) hours as needed for mild pain, fever or headache.    Marland Kitchen aspirin 81 MG chewable tablet Chew 1 tablet (81 mg total) by mouth daily. 30 tablet 11  . b complex-vitamin c-folic acid (NEPHRO-VITE) 0.8 MG TABS tablet Take 1 tablet by mouth 2 (two) times daily.    . capsaicin (ZOSTRIX) 0.025 % cream Apply 1 application topically 2 (two) times daily as needed (for lower back pain).    Marland Kitchen ipratropium-albuterol (DUONEB) 0.5-2.5 (3) MG/3ML SOLN Take 3 mLs by nebulization every 6 (six) hours as needed (for wheezing/shortness of breath).    . labetalol (NORMODYNE) 200 MG tablet Take 1 tablet (200 mg total) by mouth 3 (three) times daily. 90 tablet 0  . loratadine (CLARITIN) 10 MG tablet Take 1 tablet (10 mg total) by mouth daily. 30 tablet 11  . polyethylene glycol (MIRALAX / GLYCOLAX) packet Take 17 g by mouth daily as needed for  mild constipation. 14 each 0  . sevelamer carbonate (RENVELA) 800 MG tablet Take 2,400 mg by mouth 3 (three) times daily with meals.    . Travoprost, BAK Free, (TRAVATAN) 0.004 % SOLN ophthalmic solution Place 1 drop into both eyes at bedtime.    . enalapril (VASOTEC) 5 MG tablet Take 1 tablet (5 mg total) by mouth daily. (Patient not taking: Reported on 12/04/2015) 30 tablet 0  . oxyCODONE-acetaminophen (ROXICET) 5-325 MG tablet Take 1 tablet by mouth every 6 (six) hours as needed. (Patient not taking: Reported on 12/04/2015) 15 tablet 0     . aspirin EC  325 mg Oral Daily  . [START ON 12/13/2015]  ceFAZolin (ANCEF) IV  2 g Intravenous Once per day on Mon Wed Fri  . chlorhexidine  15 mL Mouth Rinse BID  . epoetin (EPOGEN/PROCRIT) injection  4,000 Units Intravenous Q M,W,F-HD  . heparin subcutaneous  5,000 Units Subcutaneous 3 times per day  . insulin aspart  0-5 Units Subcutaneous QHS  . insulin aspart  0-9 Units Subcutaneous TID WC  . latanoprost  1 drop Both Eyes QHS  . metoprolol tartrate  25 mg Oral BID  . mirtazapine  15 mg Oral QHS  . sevelamer carbonate  2,400 mg Oral TID WC  . sodium chloride flush  3 mL Intravenous Q12H    Infusions:    Allergies  Allergen Reactions  .  Penicillins Other (See Comments)    Reaction:  Unknown     Social History   Social History  . Marital Status: Married    Spouse Name: N/A  . Number of Children: N/A  . Years of Education: N/A   Occupational History  . Not on file.   Social History Main Topics  . Smoking status: Never Smoker   . Smokeless tobacco: Never Used  . Alcohol Use: No  . Drug Use: No  . Sexual Activity: Not on file   Other Topics Concern  . Not on file   Social History Narrative    Family History  Problem Relation Age of Onset  . Hypertension Other   . Diabetes Other     PHYSICAL EXAM: Filed Vitals:   12/11/15 2101 12/12/15 0449  BP: 102/70 162/97  Pulse: 106 87  Temp: 97.8 F (36.6 C) 98.2 F (36.8 C)   Resp: 20 24     Intake/Output Summary (Last 24 hours) at 12/12/15 0919 Last data filed at 12/12/15 0449  Gross per 24 hour  Intake    240 ml  Output   2000 ml  Net  -1760 ml    General:  Well appearing. No respiratory difficulty HEENT: normal Neck: supple. no JVD. Carotids 2+ bilat; no bruits. No lymphadenopathy or thryomegaly appreciated. Cor: PMI nondisplaced. Regular rate & rhythm. No rubs, gallops or murmurs. Lungs: clear Abdomen: soft, nontender, nondistended. No hepatosplenomegaly. No bruits or masses. Good bowel sounds. Extremities: no cyanosis, clubbing, rash, edema Neuro: alert & oriented x 3, cranial nerves grossly intact. moves all 4 extremities w/o difficulty. Affect pleasant.  ECG: Atrial fibrillation with controlled ventricular response rate, and nonsustained ventricular tachycardia about 16 beats  Results for orders placed or performed during the hospital encounter of 12/04/15 (from the past 24 hour(s))  Glucose, capillary     Status: None   Collection Time: 12/11/15  1:49 PM  Result Value Ref Range   Glucose-Capillary 99 65 - 99 mg/dL  Glucose, capillary     Status: Abnormal   Collection Time: 12/11/15  5:01 PM  Result Value Ref Range   Glucose-Capillary 161 (H) 65 - 99 mg/dL  Glucose, capillary     Status: None   Collection Time: 12/11/15  9:02 PM  Result Value Ref Range   Glucose-Capillary 92 65 - 99 mg/dL  Magnesium     Status: None   Collection Time: 12/12/15  7:29 AM  Result Value Ref Range   Magnesium 2.3 1.7 - 2.4 mg/dL  Potassium     Status: None   Collection Time: 12/12/15  7:29 AM  Result Value Ref Range   Potassium 4.1 3.5 - 5.1 mmol/L  Glucose, capillary     Status: None   Collection Time: 12/12/15  7:40 AM  Result Value Ref Range   Glucose-Capillary 92 65 - 99 mg/dL   Comment 1 Notify RN    Mr Brain Wo Contrast  12/10/2015  CLINICAL DATA:  Altered mental status. EXAM: MRI HEAD WITHOUT CONTRAST TECHNIQUE: Multiplanar, multiecho pulse  sequences of the brain and surrounding structures were obtained without intravenous contrast. COMPARISON:  CT head 12/08/2015 FINDINGS: Image quality degraded by moderate motion Moderate atrophy.  Negative for hydrocephalus Negative for acute infarct. Mild chronic microvascular ischemic change in the white matter. Brainstem intact. Cerebellum and basal ganglia intact. Negative for hemorrhage or mass. No shift of the midline structures. Normal skull base. Minimal mucosal edema paranasal sinuses. Extensive mastoid effusion bilaterally. IMPRESSION: Image quality degraded  by moderate motion. No acute infarct.  Mild chronic microvascular ischemic changes. Bilateral mastoid sinus effusion. Electronically Signed   By: Franchot Gallo M.D.   On: 12/10/2015 15:52     ASSESSMENT AND PLAN: Nonsustained ventricular tachycardia coated to be 16 beats in a setting with atrial fibrillation and congestive heart failure. Patient is already on metoprolol advise amiodarone 400 mg milligram by mouth twice a day and wean off metoprolol.  Noah Lewis A

## 2015-12-12 NOTE — Progress Notes (Signed)
Subjective:  Patient seen and evaluated at bedside. He had dialysis yesterday and tolerated well. He is arousable today but does not speak much.  Objective:  Vital signs in last 24 hours:  Temp:  [97.8 F (36.6 C)-98.2 F (36.8 C)] 98 F (36.7 C) (03/21 1210) Pulse Rate:  [86-106] 86 (03/21 1210) Resp:  [20-24] 20 (03/21 1210) BP: (102-162)/(70-97) 116/82 mmHg (03/21 1210) SpO2:  [96 %-100 %] 100 % (03/21 1210) Weight:  [74.435 kg (164 lb 1.6 oz)] 74.435 kg (164 lb 1.6 oz) (03/21 0449)  Weight change: -0.004 kg (-0.2 oz) Filed Weights   12/11/15 0937 12/11/15 1305 12/12/15 0449  Weight: 76.2 kg (167 lb 15.9 oz) 74.2 kg (163 lb 9.3 oz) 74.435 kg (164 lb 1.6 oz)    Intake/Output:    Intake/Output Summary (Last 24 hours) at 12/12/15 1515 Last data filed at 12/12/15 0949  Gross per 24 hour  Intake    240 ml  Output      0 ml  Net    240 ml     Physical Exam: General: Chronically ill-appearing, laying in the bed   HEENT Moist oral mucous membranes   Neck Supple  Pulm/lungs Normal respiratory effort,  Scattered rhonchi  CVS/Heart Irregular rhythm   Abdomen:  Soft, nontender, BS present  Extremities:  + Dependent edema   Neurologic: Sleeping, but arousable  Skin: No acute rashes   Access: Left arm AV fistula, right femoral dialysis catheter       Basic Metabolic Panel:   Recent Labs Lab 12/06/15 1309 12/10/15 0702 12/11/15 0726 12/12/15 0729  NA 131* 130* 129*  --   K 4.7 4.6  --  4.1  CL 93* 94*  --   --   CO2 22 21*  --   --   GLUCOSE 112* 84  --   --   BUN 69* 66*  --   --   CREATININE 9.94* 9.71*  --   --   CALCIUM 7.4* 7.5*  --   --   MG  --  2.6*  --  2.3     CBC:  Recent Labs Lab 12/07/15 0831 12/08/15 0252 12/09/15 0627 12/10/15 0702 12/11/15 0726  WBC 12.9* 13.6* 12.6* 14.6* 12.3*  HGB 10.0* 9.6* 9.8* 9.7* 8.8*  HCT 30.3* 29.0* 29.8* 29.2* 26.6*  MCV 84.8 84.5 85.4 86.0 85.1  PLT 145* 181 193 199 234       Microbiology:  Recent Results (from the past 720 hour(s))  MRSA PCR Screening     Status: None   Collection Time: 12/04/15 10:40 PM  Result Value Ref Range Status   MRSA by PCR NEGATIVE NEGATIVE Final    Comment:        The GeneXpert MRSA Assay (FDA approved for NASAL specimens only), is one component of a comprehensive MRSA colonization surveillance program. It is not intended to diagnose MRSA infection nor to guide or monitor treatment for MRSA infections.   CULTURE, BLOOD (ROUTINE X 2) w Reflex to PCR ID Panel     Status: None   Collection Time: 12/05/15 10:00 PM  Result Value Ref Range Status   Specimen Description BLOOD RIGHT ANTECUBITAL  Final   Special Requests BOTTLES DRAWN AEROBIC AND ANAEROBIC 5ML  Final   Culture  Setup Time   Final    GRAM POSITIVE COCCI ANAEROBIC BOTTLE ONLY CRITICAL RESULT CALLED TO, READ BACK BY AND VERIFIED WITH: MELISSA Cove 12/07/15 1458    Culture STAPHYLOCOCCUS AUREUS ANAEROBIC BOTTLE  ONLY   Final   Report Status 12/11/2015 FINAL  Final   Organism ID, Bacteria STAPHYLOCOCCUS AUREUS  Final      Susceptibility   Staphylococcus aureus - MIC*    CIPROFLOXACIN <=0.5 SENSITIVE Sensitive     ERYTHROMYCIN 4 RESISTANT Resistant     GENTAMICIN <=0.5 SENSITIVE Sensitive     OXACILLIN <=0.25 SENSITIVE Sensitive     TETRACYCLINE <=1 SENSITIVE Sensitive     VANCOMYCIN 1 SENSITIVE Sensitive     TRIMETH/SULFA <=10 SENSITIVE Sensitive     CLINDAMYCIN RESISTANT Resistant     RIFAMPIN <=0.5 SENSITIVE Sensitive     Inducible Clindamycin Value in next row Resistant      POSITIVEINDUCIBLE CLINDAMYCIN RESISTANCE - A positive ICR test is indicative of inducible resistance to macrolides, lincosamides, and type B streptogramin.  This isolate is presumed to be resistant to Clindamycin, however, Clindamycin may still be effective in some patients.     * STAPHYLOCOCCUS AUREUS  Blood Culture ID Panel (Reflexed)     Status: Abnormal   Collection Time:  12/05/15 10:00 PM  Result Value Ref Range Status   Enterococcus species NOT DETECTED NOT DETECTED Final   Vancomycin resistance NOT DETECTED NOT DETECTED Final   Listeria monocytogenes NOT DETECTED NOT DETECTED Final   Staphylococcus species DETECTED (A) NOT DETECTED Final    Comment: CRITICAL RESULT CALLED TO, READ BACK BY AND VERIFIED WITH: MELISSA MECCIA 12/07/15 1458 MLM    Staphylococcus aureus DETECTED (A) NOT DETECTED Final    Comment: CRITICAL RESULT CALLED TO, READ BACK BY AND VERIFIED WITH: MELISSA MECCIA 12/07/15 1458 MLM    Methicillin resistance NOT DETECTED NOT DETECTED Final   Streptococcus species NOT DETECTED NOT DETECTED Final   Streptococcus agalactiae NOT DETECTED NOT DETECTED Final   Streptococcus pneumoniae NOT DETECTED NOT DETECTED Final   Streptococcus pyogenes NOT DETECTED NOT DETECTED Final   Acinetobacter baumannii NOT DETECTED NOT DETECTED Final   Enterobacteriaceae species NOT DETECTED NOT DETECTED Final   Enterobacter cloacae complex NOT DETECTED NOT DETECTED Final   Escherichia coli NOT DETECTED NOT DETECTED Final   Klebsiella oxytoca NOT DETECTED NOT DETECTED Final   Klebsiella pneumoniae NOT DETECTED NOT DETECTED Final   Proteus species NOT DETECTED NOT DETECTED Final   Serratia marcescens NOT DETECTED NOT DETECTED Final   Carbapenem resistance NOT DETECTED NOT DETECTED Final   Haemophilus influenzae NOT DETECTED NOT DETECTED Final   Neisseria meningitidis NOT DETECTED NOT DETECTED Final   Pseudomonas aeruginosa NOT DETECTED NOT DETECTED Final   Candida albicans NOT DETECTED NOT DETECTED Final   Candida glabrata NOT DETECTED NOT DETECTED Final   Candida krusei NOT DETECTED NOT DETECTED Final   Candida parapsilosis NOT DETECTED NOT DETECTED Final   Candida tropicalis NOT DETECTED NOT DETECTED Final  CULTURE, BLOOD (ROUTINE X 2) w Reflex to PCR ID Panel     Status: None   Collection Time: 12/05/15 10:05 PM  Result Value Ref Range Status   Specimen  Description BLOOD BLOOD RIGHT FOREARM  Final   Special Requests BOTTLES DRAWN AEROBIC AND ANAEROBIC 5ML  Final   Culture NO GROWTH 5 DAYS  Final   Report Status 12/10/2015 FINAL  Final  CULTURE, BLOOD (ROUTINE X 2) w Reflex to PCR ID Panel     Status: None (Preliminary result)   Collection Time: 12/08/15  2:52 PM  Result Value Ref Range Status   Specimen Description BLOOD RIGHT HAND  Final   Special Requests   Final    BOTTLES  DRAWN AEROBIC AND ANAEROBIC 8CC AERO 6CC ANA   Culture NO GROWTH 4 DAYS  Final   Report Status PENDING  Incomplete  CULTURE, BLOOD (ROUTINE X 2) w Reflex to PCR ID Panel     Status: None (Preliminary result)   Collection Time: 12/08/15  6:19 PM  Result Value Ref Range Status   Specimen Description BLOOD RIGHT WRIST  Final   Special Requests   Final    BOTTLES DRAWN AEROBIC AND ANAEROBIC 11CCAERO,8CCANA   Culture NO GROWTH 4 DAYS  Final   Report Status PENDING  Incomplete    Coagulation Studies: No results for input(s): LABPROT, INR in the last 72 hours.  Urinalysis: No results for input(s): COLORURINE, LABSPEC, PHURINE, GLUCOSEU, HGBUR, BILIRUBINUR, KETONESUR, PROTEINUR, UROBILINOGEN, NITRITE, LEUKOCYTESUR in the last 72 hours.  Invalid input(s): APPERANCEUR    Imaging: Mr Brain Wo Contrast  12/10/2015  CLINICAL DATA:  Altered mental status. EXAM: MRI HEAD WITHOUT CONTRAST TECHNIQUE: Multiplanar, multiecho pulse sequences of the brain and surrounding structures were obtained without intravenous contrast. COMPARISON:  CT head 12/08/2015 FINDINGS: Image quality degraded by moderate motion Moderate atrophy.  Negative for hydrocephalus Negative for acute infarct. Mild chronic microvascular ischemic change in the white matter. Brainstem intact. Cerebellum and basal ganglia intact. Negative for hemorrhage or mass. No shift of the midline structures. Normal skull base. Minimal mucosal edema paranasal sinuses. Extensive mastoid effusion bilaterally. IMPRESSION: Image  quality degraded by moderate motion. No acute infarct.  Mild chronic microvascular ischemic changes. Bilateral mastoid sinus effusion. Electronically Signed   By: Franchot Gallo M.D.   On: 12/10/2015 15:52     Medications:     . amiodarone  400 mg Oral BID  . aspirin EC  325 mg Oral Daily  . [START ON 12/13/2015]  ceFAZolin (ANCEF) IV  2 g Intravenous Once per day on Mon Wed Fri  . chlorhexidine  15 mL Mouth Rinse BID  . epoetin (EPOGEN/PROCRIT) injection  4,000 Units Intravenous Q M,W,F-HD  . heparin subcutaneous  5,000 Units Subcutaneous 3 times per day  . insulin aspart  0-5 Units Subcutaneous QHS  . insulin aspart  0-9 Units Subcutaneous TID WC  . latanoprost  1 drop Both Eyes QHS  . metoprolol tartrate  25 mg Oral BID  . mirtazapine  15 mg Oral QHS  . sevelamer carbonate  2,400 mg Oral TID WC  . sodium chloride flush  3 mL Intravenous Q12H   sodium chloride, acetaminophen **OR** acetaminophen, ipratropium-albuterol, labetalol, lidocaine-prilocaine, ondansetron **OR** ondansetron (ZOFRAN) IV, polyethylene glycol  Assessment/ Plan:  65 y.o. male with end-stage renal disease, diabetes, hypertension [place] [facility]  1. End-stage renal disease with hyperkalemia - Patient had hemodialysis yesterday. No acute indication for dialysis today. We will plan for dialysis again tomorrow. Potassium normal at 4.1 at the moment.   2. Atrial fibrillation - Patient transitioned to metoprolol. Was on diltiazem previously.  3. Anemia of chronic kidney disease - Hemoglobin down to 8.8. Continue Epogen 4000 units IV with dialysis.  4. SHPTH - Continue Renvela 2400 mg by mouth 3 times a day with meals.  Continue to periodically monitor phosphorus.  5.  Altered mental status:  Still lethargic but arousable.  MRI brain reviewed.   Psychiatry and neurology have been following.     LOS: [place] 3/21/20173:15 PM

## 2015-12-12 NOTE — Clinical Documentation Improvement (Signed)
Cardiology Infectious Disease  Conflicting documentation in regards to Staph Aureus Sepsis (Cardiology 3/17 progress note) and Staph Aureus Bacteremia (ID notes). Please clarify in next progress note which condition you are treating as they are different per Coding Guidelines.    Sepsis ruled in and was present or evolving on admission - source?  Sepsis ruled out  Other  Clinically Undetermined  Supporting Information:  Positive blood cultures drawn at HD; follow up blood cultures drawn on 3/17 were negative  Removal of right femoral Permacath performed on 12/11/15  Has organ failure with ARF, Acute on Chronic Diastolic CHF  Has been treated with IV Vancomycin and Meropenem  Please exercise your independent, professional judgment when responding. A specific answer is not anticipated or expected.  Thank You, Zoila Shutter RN, BSN, [place] [phone]; Cell: [phone]

## 2015-12-12 NOTE — Progress Notes (Signed)
CCMD notified RN of 16 beat run of non-sustained VT. Notified MD; orders for STAT potassium and magnesium labs placed. Nursing staff will continue to monitor. Earleen Reaper, RN

## 2015-12-12 NOTE — Progress Notes (Signed)
CSW arranged patient's personal belonging pick up with Dawayne Patricia with [facility] [employer] [facility] for patient's brother- [name] to pick up his personal belongings. CSW contacted patient's brother [name] and informed him of above.   CSW spoke to [place] with [facility] and informed her that patient has accepted bed offer.  CSW will continue to follow assist.  Ernest Pine, MSW, [place] Work Department [phone]

## 2015-12-12 NOTE — Consult Note (Signed)
[facility] Face-to-Face Psychiatry Consult   Reason for Consult:  Consult for this 65 year old man with multiple medical problems and reported worsening depression. Concerns about his depression and behavior Referring Physician:  Bridgett Larsson Patient Identification: Noah Lewis MRN:  [id number] Principal Diagnosis: Bipolar disorder depressed Diagnosis:   Patient Active Problem List   Diagnosis Date Noted  . Back pain [M54.9]   . Bacteremia [R78.81]   . A-fib ([place]) [I48.91] 12/04/2015  . Hypertensive urgency [I16.0] 09/20/2015  . 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 Anemia of chronic kidney failure [N18.9, D63.1]   . Left arm swelling [M79.89]   . End-stage renal disease needing dialysis ([place]) [N18.6] 05/29/2015  . ESRD needing dialysis ([place]) [N18.6] 05/29/2015  . Benign hypertensive renal disease with renal failure [I12.9, N18.9] 05/24/2015  . COPD, moderate ([place]) [J44.9] 05/24/2015  . OSA (obstructive sleep apnea) [G47.33] 05/24/2015  . 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 ESRD on hemodialysis ([place]) [N18.6, Z99.2] 05/13/2015  . New onset atrial fibrillation ([place]) [I48.91] 05/13/2015  . Volume overload [E87.70] 04/28/2015  . Bipolar depression ([place]) [F31.30] 04/28/2015  . HTN (hypertension) [I10] 04/28/2015  . Chronic back pain [M54.9, G89.29] 04/28/2015  . Facial swelling [R22.0] 04/28/2015  . Left upper extremity swelling [M79.89]     Total Time spent with patient: 30 minutes  Subjective:   Noah Lewis is a 65 y.o. male patient admitted with "I used to sell cocaine".  Update as of Tuesday, March 21. Once again came by to try and establish some rapport with the patient. Found him in bed appearing to be asleep but occasionally at least slightly arousable. With multiple efforts I got him to grunt at me but he did not say anything intelligible. Would not respond to any  questions. On the other hand it does look like he's eating some food and medically it looks like he is stabilizing. I tried to make it clear to the patient in case he was listening that I was wanting to be of some help to him with his mood. Couldn't establish any other contact. No change to medicine therefore.  HPI:  Attempted patient interview. Attempted several times to get him to wake up. Patient would open his eyes for only a few seconds at a time. The sentence above was the only coherent thing he said to me and he did not follow-up on it. Chart reviewed. The only psychiatric note in the chart is from a psychiatrist at [facility] behavioral last summer and doesn't give much detail because they didn't have much information at that time. Reviewed his medicines from his group home. Spoke with nursing. Patient in the hospital with atrial fibrillation also chronic hemodialysis. Reportedly has been noncompliant with dialysis refusing some of his dialysis treatments. Nursing reports the patient is refusing to eat anything. Nursing reports she has been told that earlier this week the patient was told that he was losing custody of his children. I find this somewhat confusing as at his age I would not expect him to have children young enough for that matter and from what I understood he was living in a group home and so unlikely to be raising any children anyway. Asian of course did not give me any information about this. He was not able to answer any questions about his mood or suicidal ideation or psychotic symptoms. According to the records from his group home he was not  on any kind of psychiatric medicine.  Social history: Evidently he has been living in a group home. Prior to that there are reports that he had been homeless at times. Has an elderly mother who is listed as his only contact.  Medical history: Hemodialysis dependent. Hypertension. Atrial fibrillation. COPD. Diabetes.  Substance abuse history: Patient  didn't give me any information about that and I don't have anything old and the chart about it.  Past Psychiatric History: Patient reportedly has a past diagnosis of bipolar disorder. The only known hospitalization was in 2002 and predates electronic records. In the time that I can find in his chart it doesn't look like he was taking any psychiatric medicine. Unknown therefore the details or whether he really has bipolar disorder.  Risk to Self: Is patient at risk for suicide?: No patient is currently not eating and is not cooperative with treatment underlying mental state hard to tell Risk to Others:   none Prior Inpatient Therapy:   1 known prior hospitalization records are not available Prior Outpatient Therapy:   did not appear to be involved in any outpatient treatment recently  Past Medical History:  Past Medical History  Diagnosis Date  . Cancer ([place])   . Arthritis   . Renal disorder   . Hypertension   . Diabetes mellitus without complication ([place])   . Asthma   . COPD (chronic obstructive pulmonary disease) ([place])   . Coronary artery disease     Past Surgical History  Procedure Laterality Date  . No past surgeries    . Insertion of dialysis catheter    . Insertion of dialysis catheter N/A 05/29/2015    Procedure: INSERTION OF DIALYSIS CATHETER;  Surgeon: Angelia Mould, MD;  Location: [place];  Service: Vascular;  Laterality: N/A;  . Peripheral vascular catheterization N/A 10/25/2015    Procedure: Dialysis/Perma Catheter Insertion;  Surgeon: Katha Cabal, MD;  Location: [facility];  Service: Cardiovascular;  Laterality: N/A;   Family History:  Family History  Problem Relation Age of Onset  . Hypertension Other   . Diabetes Other    Family Psychiatric  History: Patient was not able to answer any questions above. Social History:  History  Alcohol Use No     History  Drug Use No    Social History   Social History  . Marital Status: Married    Spouse  Name: N/A  . Number of Children: N/A  . Years of Education: N/A   Social History Main Topics  . Smoking status: Never Smoker   . Smokeless tobacco: Never Used  . Alcohol Use: No  . Drug Use: No  . Sexual Activity: Not Asked   Other Topics Concern  . None   Social History Narrative   Additional Social History:    Allergies:   Allergies  Allergen Reactions  . Penicillins Other (See Comments)    Reaction:  Unknown     Labs:  Results for orders placed or performed during the hospital encounter of 12/04/15 (from the past 48 hour(s))  Glucose, capillary     Status: Abnormal   Collection Time: 12/10/15  4:50 PM  Result Value Ref Range   Glucose-Capillary 106 (H) 65 - 99 mg/dL  Glucose, capillary     Status: None   Collection Time: 12/10/15  9:38 PM  Result Value Ref Range   Glucose-Capillary 89 65 - 99 mg/dL  Vancomycin, random     Status: None   Collection Time: 12/11/15  7:26 AM  Result Value Ref Range   Vancomycin Rm 16 ug/mL    Comment:        Random Vancomycin therapeutic range is dependent on dosage and time of specimen collection. A peak range is 20.0-40.0 ug/mL A trough range is 5.0-15.0 ug/mL          Sodium     Status: Abnormal   Collection Time: 12/11/15  7:26 AM  Result Value Ref Range   Sodium 129 (L) 135 - 145 mmol/L  CBC     Status: Abnormal   Collection Time: 12/11/15  7:26 AM  Result Value Ref Range   WBC 12.3 (H) 3.8 - 10.6 K/uL   RBC 3.13 (L) 4.40 - 5.90 MIL/uL   Hemoglobin 8.8 (L) 13.0 - 18.0 g/dL   HCT 26.6 (L) 40.0 - 52.0 %   MCV 85.1 80.0 - 100.0 fL   MCH 28.2 26.0 - 34.0 pg   MCHC 33.1 32.0 - 36.0 g/dL   RDW 16.7 (H) 11.5 - 14.5 %   Platelets 234 150 - 440 K/uL  Glucose, capillary     Status: None   Collection Time: 12/11/15  7:33 AM  Result Value Ref Range   Glucose-Capillary 95 65 - 99 mg/dL  Glucose, capillary     Status: None   Collection Time: 12/11/15  1:49 PM  Result Value Ref Range   Glucose-Capillary 99 65 - 99 mg/dL   Glucose, capillary     Status: Abnormal   Collection Time: 12/11/15  5:01 PM  Result Value Ref Range   Glucose-Capillary 161 (H) 65 - 99 mg/dL  Glucose, capillary     Status: None   Collection Time: 12/11/15  9:02 PM  Result Value Ref Range   Glucose-Capillary 92 65 - 99 mg/dL  Magnesium     Status: None   Collection Time: 12/12/15  7:29 AM  Result Value Ref Range   Magnesium 2.3 1.7 - 2.4 mg/dL  Potassium     Status: None   Collection Time: 12/12/15  7:29 AM  Result Value Ref Range   Potassium 4.1 3.5 - 5.1 mmol/L  Glucose, capillary     Status: None   Collection Time: 12/12/15  7:40 AM  Result Value Ref Range   Glucose-Capillary 92 65 - 99 mg/dL   Comment 1 Notify RN   Glucose, capillary     Status: Abnormal   Collection Time: 12/12/15 11:50 AM  Result Value Ref Range   Glucose-Capillary 108 (H) 65 - 99 mg/dL   Comment 1 Notify RN     Current Facility-Administered Medications  Medication Dose Route Frequency Provider Last Rate Last Dose  . 0.9 %  sodium chloride infusion  250 mL Intravenous PRN Srikar Sudini, MD 10 mL/hr at 12/06/15 0700 250 mL at 12/06/15 0700  . acetaminophen (TYLENOL) tablet 650 mg  650 mg Oral Q6H PRN Hillary Bow, MD       Or  . acetaminophen (TYLENOL) suppository 650 mg  650 mg Rectal Q6H PRN Srikar Sudini, MD      . amiodarone (PACERONE) tablet 400 mg  400 mg Oral BID Dionisio David, MD   400 mg at 12/12/15 1157  . aspirin EC tablet 325 mg  325 mg Oral Daily Colleen Can, MD   325 mg at 12/09/15 1600  . [START ON 12/13/2015] ceFAZolin (ANCEF) IVPB 2 g/50 mL premix  2 g Intravenous Once per day on Mon Wed Fri Theodoro Grist, MD      .  chlorhexidine (PERIDEX) 0.12 % solution 15 mL  15 mL Mouth Rinse BID Lance Coon, MD   15 mL at 12/07/15 2159  . epoetin alfa (EPOGEN,PROCRIT) injection 4,000 Units  4,000 Units Intravenous Q M,W,F-HD Murlean Iba, MD   4,000 Units at 12/11/15 1159  . heparin injection 5,000 Units  5,000 Units Subcutaneous 3  times per day Theodoro Grist, MD   5,000 Units at 12/12/15 0514  . insulin aspart (novoLOG) injection 0-5 Units  0-5 Units Subcutaneous QHS Hillary Bow, MD   0 Units at 12/04/15 2258  . insulin aspart (novoLOG) injection 0-9 Units  0-9 Units Subcutaneous TID WC Hillary Bow, MD   2 Units at 12/11/15 1700  . ipratropium-albuterol (DUONEB) 0.5-2.5 (3) MG/3ML nebulizer solution 3 mL  3 mL Nebulization Q6H PRN Srikar Sudini, MD      . labetalol (NORMODYNE,TRANDATE) injection 10 mg  10 mg Intravenous Q2H PRN Murlean Iba, MD   10 mg at 12/06/15 1306  . latanoprost (XALATAN) 0.005 % ophthalmic solution 1 drop  1 drop Both Eyes QHS Hillary Bow, MD   1 drop at 12/09/15 2226  . lidocaine-prilocaine (EMLA) cream   Topical PRN Munsoor Lateef, MD      . metoprolol tartrate (LOPRESSOR) tablet 25 mg  25 mg Oral BID Theodoro Grist, MD   25 mg at 12/12/15 0906  . mirtazapine (REMERON SOL-TAB) disintegrating tablet 15 mg  15 mg Oral QHS Gonzella Lex, MD   15 mg at 12/08/15 2142  . ondansetron (ZOFRAN) tablet 4 mg  4 mg Oral Q6H PRN Hillary Bow, MD       Or  . ondansetron (ZOFRAN) injection 4 mg  4 mg Intravenous Q6H PRN Hillary Bow, MD   4 mg at 12/05/15 0853  . polyethylene glycol (MIRALAX / GLYCOLAX) packet 17 g  17 g Oral Daily PRN Srikar Sudini, MD      . sevelamer carbonate (RENVELA) tablet 2,400 mg  2,400 mg Oral TID WC Srikar Sudini, MD   2,400 mg at 12/12/15 1157  . sodium chloride flush (NS) 0.9 % injection 3 mL  3 mL Intravenous Q12H Hillary Bow, MD   3 mL at 12/12/15 0912    Musculoskeletal: Strength & Muscle Tone: decreased Gait & Station: unable to stand Patient leans: N/A  Psychiatric Specialty Exam: Review of Systems  Unable to perform ROS: patient unresponsive    Blood pressure 116/82, pulse 86, temperature 98 F (36.7 C), temperature source Oral, resp. rate 20, height 5\' 9"  (1.753 m), weight 74.435 kg (164 lb 1.6 oz), SpO2 100 %.Body mass index is 24.22 kg/(m^2).  General  Appearance: Disheveled  Eye Contact::  None  Speech:  Garbled, Slow and Slurred  Volume:  Decreased  Mood:  Not stated  Affect:  Flat  Thought Process:  Negative  Orientation:  Negative  Thought Content:  Negative  Suicidal Thoughts:  Unknown  Homicidal Thoughts:  Unknown  Memory:  Negative  Judgement:  Negative  Insight:  Negative  Psychomotor Activity:  Decreased  Concentration:  Poor  Recall:  Poor  Fund of Knowledge:Poor  Language: Poor  Akathisia:  No  Handed:  Right  AIMS (if indicated):     Assets:  Housing  ADL's:  Impaired  Cognition: Impaired,  Severe  Sleep:      Treatment Plan Summary: Daily contact with patient to assess and evaluate symptoms and progress in treatment, Medication management and Plan Patient with a history of depression and substance abuse and personality disorder. Partially  cooperative or at least not oppositional to medical treatment. Noncooperative with psychiatric interview. I am told he will speak a little bit more with other staff but not very much. Continue on the low dose of mirtazapine. No increase in dosage because of hepatic disease. Continue trying to contact daily and establish some rapport. As long as he is hemodynamically stable and eating some no indication to try and escalate the intensity of treatment.  Disposition: Daily contact with patient to assess and evaluate symptoms and progress in treatment, Medication management and Plan Seen above  Alethia Berthold, MD 12/12/2015 3:39 PM

## 2015-12-12 NOTE — H&P (Signed)
[facility] [place] NAME: Noah Lewis    MR#:  [id number]  DATE OF BIRTH:  Nov 16, 1950  SUBJECTIVE:  CHIEF COMPLAINT:   Chief Complaint  Patient presents with  . Back Pain  Seen during hemodialysis Minimally responsive to verbal stimuli, not following commands, not responding to palpation of the back, weaned off nonrebreather, now on nasal cannulas. Not able to obtain the review of systems. MRI of the back revealed 8/10 mm fluid collection in the soft tissues at L5 area, no connection with the epidural space or joint. Patient was seen by surgeon, who did not recommend any intervention at present. Surgeon felt that patient's hemodialysis vascular access is likely source of infection. Vascular surgery is consulted to remove her PermCath from right groin. No review of systems possible due to altered mental status, although patient is more alert today, opening eyes with painful stimuli, nonverbal, does not follow commands  REVIEW OF SYSTEMS:   ROS unobtainable due to altered mental status  DRUG ALLERGIES:   Allergies  Allergen Reactions  . Penicillins Other (See Comments)    Reaction:  Unknown     VITALS:  Blood pressure 116/82, pulse 86, temperature 98 F (36.7 C), temperature source Oral, resp. rate 20, height 5\' 9"  (1.753 m), weight 74.435 kg (164 lb 1.6 oz), SpO2 100 %.  PHYSICAL EXAMINATION:  GENERAL:  65 y.o.-year-old patient lying in the bed , looking around, shaking or nodding head, inappropriately to questions, nonverbal, overall poorly responsive EYES: Pupils equal, round, reactive to light and accommodation. No scleral icterus. Extraocular muscles intact.  HEENT: Head atraumatic, normocephalic. Oropharynx and nasopharynx clear.  NECK:  Supple, no jugular venous distention. No thyroid enlargement, no tenderness.  LUNGS: Bilateral rhonchi, snoring, poor air entry intermittently due to obstructive symptoms. CARDIOVASCULAR:  S1, S2 normal. No murmurs, rubs, or gallops.  ABDOMEN: Soft, nontender, nondistended. Bowel sounds present. No organomegaly or mass.  EXTREMITIES: No pedal edema, cyanosis, or clubbing.  NEUROLOGIC: Not moving upper or lower extremities. As poorly responsive. Does not follow commands. PSYCHIATRIC: The patient is drowsy SKIN: No obvious rash, lesion, or ulcer.    LABORATORY PANEL:   CBC  Recent Labs Lab 12/11/15 0726  WBC 12.3*  HGB 8.8*  HCT 26.6*  PLT 234   ------------------------------------------------------------------------------------------------------------------  Chemistries   Recent Labs Lab 12/10/15 0702 12/11/15 0726 12/12/15 0729  NA 130* 129*  --   K 4.6  --  4.1  CL 94*  --   --   CO2 21*  --   --   GLUCOSE 84  --   --   BUN 66*  --   --   CREATININE 9.71*  --   --   CALCIUM 7.5*  --   --   MG 2.6*  --  2.3   ------------------------------------------------------------------------------------------------------------------  Cardiac Enzymes No results for input(s): TROPONINI in the last 168 hours. ------------------------------------------------------------------------------------------------------------------  RADIOLOGY:  Mr Brain Wo Contrast  12/10/2015  CLINICAL DATA:  Altered mental status. EXAM: MRI HEAD WITHOUT CONTRAST TECHNIQUE: Multiplanar, multiecho pulse sequences of the brain and surrounding structures were obtained without intravenous contrast. COMPARISON:  CT head 12/08/2015 FINDINGS: Image quality degraded by moderate motion Moderate atrophy.  Negative for hydrocephalus Negative for acute infarct. Mild chronic microvascular ischemic change in the white matter. Brainstem intact. Cerebellum and basal ganglia intact. Negative for hemorrhage or mass. No shift of the midline structures. Normal skull base. Minimal mucosal edema paranasal sinuses. Extensive mastoid effusion  bilaterally. IMPRESSION: Image quality degraded by moderate motion. No acute  infarct.  Mild chronic microvascular ischemic changes. Bilateral mastoid sinus effusion. Electronically Signed   By: Franchot Gallo M.D.   On: 12/10/2015 15:52    EKG:   Orders placed or performed during the hospital encounter of 12/04/15  . ED EKG  . ED EKG    ASSESSMENT AND PLAN:   * Acute encephalopathy due to unclear etiology. The ABGs were unremarkable, no CO2 retention, ammonia level is unremarkable Seems to be multifactorial due to depression, acute infection. Ammonia, B12, TSH levels were normal.  CT scan of the head without contrast was negative for acute intracranial process.Appreciated . Neurology input, metabolic encephalopathy was suggested.  Awaiting for psychiatry's ,  palliative care input as well. MRI of brain showed no acute infarct, chronic microvascular ischemic changes. Some better overall, more alert, however, still not communicating.    * Acute on chronic diastolic CHF with acute hypoxic respiratory failure - off  Nonrebreather, on nasal cannulas, at 5 L. His saturations were in the 50s off the NONREBREATHER. Chest x-ray showed pulmonary edema.   Chest x-ray March 19th revealed  cardiomegaly, worsened perihilar opacities, likely worsening congestive heart failure, increased pleural effusion on the left. Patient is being continued on dialysis, stable, oxygen saturation 99 -100% on 5 L  * New onset Atrial fibrillation Of oral Cardizem due to hypotension.  Heart ratefluctuated between 90s to 100   * MSSA bacteremia - afebrile Blood cultures drawn at the dialysis center on 12/02/2015. cultures repeated in the hospital on 12/05/2015 Have MSSA.[name] recommends Ancef with hemodialysis for 6 weeks since negative blood cultures 12/08/2015  MRI of the lumbar spine revealed small fluid collection, unlikely abscess per surgery. Vascular surgery is consulted to remove PermCath from right groin, removed 20th of March  * Hyperkalemia with end-stage renal  disease. Improved with Kayexalate, IV insulin and D50 In the emergency room Continue HD. Follow potassium levels closely.  * Hypotension,.Resolved, off medications  Holding labetalol and cardizem. Vasotec held due to hyperkalemia on admission. Stable blood pressure readings at present and restart blood pressure medications as tolerated.  * Severe depression Appreciate Dr. Weber Cooks seeing the patient. Now on Remeron.   * Chronic low back pain. MRI of lumbar spine did not show any acute abnormalities except of small cyst in paraspinal area, unlikely abscess per surgery, no intervention was recommended  Due to encephalopathy opiates are placed on hold   * Diabetes mellitus Sliding scale due to unpredictable oral intake.  * DVT prophylaxis. Patient is on heparin subcutaneously.  *Hyponatremia, likely due to fluid overload, follow after hemodialysis   * Patient is incompetent to make decisions for medical care, patient's family, patient's brother stepped up and is willing to make decisions in regards to medical issues, patient's brother needs to be cold for all medical needs.  I discussed patient's case with multiple family members yesterday, patient's mother refuses to be power of attorney for medical care, however, patient's brother stepped up and is willing to take discharge, he is to sign documentation provided for him in the hospital to become power of attorney for medical care    All the records are reviewed and case discussed with Care Management/Social Workerr. Management plans discussed with the patient, mother and they are in agreement.  CODE STATUS: full code.  TOTAL CC TIME TAKING CARE OF THIS PATIENT: 35 minutes.   POSSIBLE D/C IN 3-4 DAYS, DEPENDING ON CLINICAL CONDITION.  [email or web address]  on 12/12/2015 at 3:03 PM  Between 7am to 6pm - Pager - [phone]  After 6pm go to [email or web address] - password [email or web address] [facility]  Office   [phone]  CC: Primary care physician; Elyse Jarvis, MD

## 2015-12-12 NOTE — Progress Notes (Addendum)
CSW presented bed offers to patient's brother- [name].  He preferred SNFs in [place] so that patient can be closer to him. Chose bed offer at [facility] and [facility]. CSW contacted Lewis Moccasin- Admissions at [facility] left a voicemail. Awaiting phone call back to confirm patient's bed. CSW also contacted Iran Sizer- Dialysis Coordinator to inform her to discuss patient's discharge plans. Left voicemail. Awaiting call back.   CSW received a phone call back Iran Sizer- Dialysis Coordinator. She reports that she will begin working on switching patient's dialysis location and chair time to [place], [place] to be close to his SNF. CSW will continue to follow and assist.  Ernest Pine, MSW, [place] Work Department [phone]

## 2015-12-13 LAB — CULTURE, BLOOD (ROUTINE X 2)
CULTURE: NO GROWTH
Culture: NO GROWTH

## 2015-12-13 LAB — GLUCOSE, CAPILLARY
GLUCOSE-CAPILLARY: 149 mg/dL — AB (ref 65–99)
GLUCOSE-CAPILLARY: 120 mg/dL — AB (ref 65–99)
Glucose-Capillary: 97 mg/dL (ref 65–99)
Glucose-Capillary: 86 mg/dL (ref 65–99)

## 2015-12-13 MED ORDER — CEFAZOLIN SODIUM-DEXTROSE 2-4 GM/100ML-% IV SOLN
2.0000 g | INTRAVENOUS | Status: DC
  Administered 2015-12-13: 2 g via INTRAVENOUS
  Filled 2015-12-13 (×2): qty 100

## 2015-12-13 NOTE — Progress Notes (Signed)
CSW spoke to Iran Sizer- Dialysis Coordinator to follow up on patient's dialysis chair being switched to [facility] near [employer]. Provided her [place]'s address. CSW awaiting new Dialysis Chair and location. CSW will continue to follow and assist.   Ernest Pine, MSW, [place] Work Department [phone]

## 2015-12-13 NOTE — Progress Notes (Signed)
PRE HD   

## 2015-12-13 NOTE — Progress Notes (Signed)
  SUBJECTIVE: Pt resting in bed. He is alert but not answering questions appropriately. Unable to tell year, president and wont tell me his name. HE does not make eye contact. Does not appear to be in any discomfort.   Filed Vitals:   12/13/15 1248 12/13/15 1254 12/13/15 1332 12/13/15 1340  BP: 134/95 121/95 139/81   Pulse: 98 95 96   Temp: 97.9 F (36.6 C)  98.1 F (36.7 C)   TempSrc: Oral  Oral   Resp: 13 15 18    Height:    5\' 9"  (1.753 m)  Weight:    162 lb 14.7 oz (73.9 kg)  SpO2:   99%     Intake/Output Summary (Last 24 hours) at 12/13/15 1412 Last data filed at 12/13/15 1248  Gross per 24 hour  Intake      0 ml  Output   1500 ml  Net  -1500 ml    LABS: Basic Metabolic Panel:  Recent Labs  12/11/15 0726 12/12/15 0729  NA 129*  --   K  --  4.1  MG  --  2.3   Liver Function Tests: No results for input(s): AST, ALT, ALKPHOS, BILITOT, PROT, ALBUMIN in the last 72 hours. No results for input(s): LIPASE, AMYLASE in the last 72 hours. CBC:  Recent Labs  12/11/15 0726  WBC 12.3*  HGB 8.8*  HCT 26.6*  MCV 85.1  PLT 234   Cardiac Enzymes: No results for input(s): CKTOTAL, CKMB, CKMBINDEX, TROPONINI in the last 72 hours. BNP: Invalid input(s): POCBNP D-Dimer: No results for input(s): DDIMER in the last 72 hours. Hemoglobin A1C: No results for input(s): HGBA1C in the last 72 hours. Fasting Lipid Panel: No results for input(s): CHOL, HDL, LDLCALC, TRIG, CHOLHDL, LDLDIRECT in the last 72 hours. Thyroid Function Tests: No results for input(s): TSH, T4TOTAL, T3FREE, THYROIDAB in the last 72 hours.  Invalid input(s): FREET3 Anemia Panel: No results for input(s): VITAMINB12, FOLATE, FERRITIN, TIBC, IRON, RETICCTPCT in the last 72 hours.   PHYSICAL EXAM General: Well developed, well nourished, in no acute distress HEENT:  Normocephalic and atramatic Neck:  No JVD.  Lungs: Clear bilaterally to auscultation and percussion. Heart: HRRR . Normal S1 and S2  without gallops or murmurs.  Abdomen: Bowel sounds are positive, abdomen soft and non-tender  Msk:  Back normal, normal gait. Normal strength and tone for age. Extremities: No clubbing, cyanosis or edema.   Neuro: Alert and oriented X 3. Psych:  Good affect, responds appropriately  TELEMETRY: Atrial fibrillation 89 bpm, Occ PVCs and rare couplets  ASSESSMENT AND PLAN: Atrial fibrillation rate controlled with amiodarone. NSVT of 16 beats yesterday, but none since. Over night heart rate at low as the 40's while sleeping, 70's and 80's while awake. Pt not safe for anticoagulation. Is on full strength aspirin.  Continue current care.  Active Problems:   Bipolar depression ([place])   A-fib ([place])   Bacteremia   Back pain    Daune Perch, NP 12/13/2015 2:12 PM

## 2015-12-13 NOTE — Progress Notes (Signed)
TX started 

## 2015-12-13 NOTE — Progress Notes (Addendum)
Palliative Medicine Inpatient Consult Follow Up Note   Name: Noah Lewis Date: 12/13/2015 MRN: [id number]  DOB: 1951/07/22  Referring Physician: Hillary Bow, MD  Palliative Care consult requested for this 65 y.o. male for goals of medical therapy in patient with ESRD,  MSSA BACTEREMIA--with POSSIBLE sepsis due to tachycardia (Afib RVR), and Depression.    HISTORY AND REVIEW OF OLD RECORDS: He resides in a [facility] (not a 'Group Home' as mentioned in some notes --there is a distinction). He had been refusing some of his dialysis sessions (leaving early and also refusing to go) --and had also been refusing meds. This apparently developed after he was informed that he might be legally limited in terms of contact with his children. He was found here to have a potassium of 6.1 and he also had new onset AFib with RVr requiring Cardizem IV. An INFECTIOUS DISEASE CONSULT is underway for 'bacteremia' but I do not have access to those results in the computer so am not sure what is turing up. He does have positive findings for Hep C apparently.     He was seen by psychiatry (Dr. Weber Cooks) for possible depression. He was started on Seroquel 100 mg qhs. He got this dose for the first time last night and due to some sedation, his attending has reduced this to 50 mg by attending physician.   I have spoken with both pts attending, Dr Darvin Neighbours, and with Dr. Weber Cooks. I have also talked with ICU nurses and Critical Care physician, Dr Alva Garnet. Cardiology stated he is a poor candidate for cardioversion and rec to change to oral Cardizem.   The main issue is the pts capacity to make decisions to not get treatment. He certainly has the right to reject dialysis and meds, but if this is due to a depression or cognitive decline/ impairment, then this decision comes into question to some degree.   I reviewed his old records with Dr. Weber Cooks. These show a history of polysubstance abuse including  cocaine and marijuana and alcohol. He has had a psychiatric stay in 2012 for several days. He also had an admission to a behavioral hospital in 2002 but records of that are not available. Some notes mention him 'leaving AMA' and some mention 'noncompliance with medications'. A few notes mention that he has had 'cancer' in the past. One note I reviewed says he has a h/o PROSTATE CANCER (note in 2007).  A note from August 2016 mentions that he was hospitalized then due to refusal of dialysis. Notes state that he had been refusing dialysis and he was at risk not being able to go to a SNF in [place] if he refused it one more time. This note also mentions that he had been seen by psychiatry but did not have a med Rxd for his 'Bipolar Disorder'. Psychiatry note states there was no clear evidence that he had Bipolar Disorder. He had been living with roommates in [facility], but when they moved out, he had to go to live in an ALF situation as his mother is not able to care for him in the home.   He is noted to be quite severely HARD OF HEARING. Other notes say he is 'DEAF'. He had an audiology eval in 2016 showing moderate hearing loss at some frequencies and severe at other frequencies (bilaterally). Hearing aids were recommended. He has required CPAP in prior hospital stays.   The August 2016 note mentions 'NEW onset AFib' as a diagnosis.  UPDATE SINCE 12/06/15: Blood cultures grew MSSA.  An ID consult recommended spine imaging to look for source.  MRI showed cysts and a surgical consult from Dr. Clayburn Pert was obtained on 3/18.  His opinion was that this was a subcentimeter fluid collection and there was also no tenderness at this area and he did not feel this was likely to be the source of the bacteremia.  He felt that it was far more likely to be the vascular access for dialysis.  A neurology consult by Dr. Alexis Goodell recommended continued medical management for his metabolic  encephalopathy likely associated with his illnesses including infection, hyponatremia, and worsened renal function.  Pt has demonstrated that he has not wanted to talk to consultants --including Dr Weber Cooks, psychiatrist.  Andris Flurry was removed from left thigh on 3/20.  Pt started talking a bit more with staff on 3/21.     Apparently a brother ([name]) had been somewhat involved but has removed himself from being involved due to some disagreement with pt's mother (per social work notes).  Pt is being switched from [facility] dialysis to another location and a dialysis chair has to be secured.   Pt has been seen by cardiology for the AFib and med mgmnt is to continue --but he is not felt to be safe for anticoagulation due to a long history of periods of medical noncompliance (see above details).   Records are odd regarding oral intake. A recent calorie count that I had ordered was stopped early because it showed that pt was eating 75% or MORE of his meals.  But only 120-240 mls are being documented as consumed daily.  That does not fit with the calorie count data from 3/17 -3/19.    Nonetheless, he is eating more and starting to talk or interact some. And he is going to dialysis.  It appears he is going to go to SNF with long term care.  Agree with social worker that ALF / Group Home is not appropriate at this time for pt.   Mother is still the decision maker at this time, given that pt does not interact or answer questions reliably enough to demonstrate capacity at this time.  --------------------------------------------------------------------------------------------------    IMPRESSION: ESRD --with initial  refusal of HD recently after an emotionally upsetting event where his visits by his children were cut off by DSS.  ---HD cath insertion was in 05/2015.  Hyperkalemia Back Pain Depression --has been diagnosed with this before ---record from 02/06/2011 mentions he had a h/o a suicide attempt  yrs ago ---notes list one overdose on Clonidine Question of bipolar disorder ----last psychiatry note prior to this stay stated no evidence he is bipolar H/O refusal of HD and h/o leaving AMA and h/o medical noncompliance Profound Hearing Loss bilaterally Apparent h/o prostate cancer CAD with MI in 1986 AFIB with RVR --apparently paroxysmal as he did this in Aug 2016 also HTN COPD/ asthma --with h/o smoking tobacco per previous records (though current notes say 'never smoker') H/O Polysubstance Abuse ---alcohol, cocaine, and marijuana Dyslipidemia Past H/O CHF (details not elaborated upon in old notes) H/O Diverticultis with bowel perforation H/O scrotal abscess --treated surgically Prior h/o elevated LFTs Suspected Sleep Apnea (as per cardiology consult by Dr. Humphrey Rolls)   CODE STATUS: Full code   PAST MEDICAL HISTORY: Past Medical History  Diagnosis Date  . Cancer ([place])   . Arthritis   . Renal disorder   . Hypertension   . Diabetes mellitus without complication ([place])   .  Asthma   . COPD (chronic obstructive pulmonary disease) ([place])   . Coronary artery disease     PAST SURGICAL HISTORY:  Past Surgical History  Procedure Laterality Date  . No past surgeries    . Insertion of dialysis catheter    . Insertion of dialysis catheter N/A 05/29/2015    Procedure: INSERTION OF DIALYSIS CATHETER;  Surgeon: Angelia Mould, MD;  Location: [facility];  Service: Vascular;  Laterality: N/A;  . Peripheral vascular catheterization N/A 10/25/2015    Procedure: Dialysis/Perma Catheter Insertion;  Surgeon: Katha Cabal, MD;  Location: [facility];  Service: Cardiovascular;  Laterality: N/A;    Vital Signs: BP 139/81 mmHg  Pulse 96  Temp(Src) 98.1 F (36.7 C) (Oral)  Resp 18  Ht 5\' 9"  (1.753 m)  Wt 73.9 kg (162 lb 14.7 oz)  BMI 24.05 kg/m2  SpO2 99% Filed Weights   12/13/15 0433 12/13/15 0918 12/13/15 1340  Weight: 75.116 kg (165 lb 9.6 oz) 76.3 kg (168 lb 3.4 oz) 73.9  kg (162 lb 14.7 oz)    Estimated body mass index is 24.05 kg/(m^2) as calculated from the following:   Height as of this encounter: 5\' 9"  (1.753 m).   Weight as of this encounter: 73.9 kg (162 lb 14.7 oz).  PHYSICAL EXAM: Lying on side in bed --sleeping Wakens but does not interact   LABS: CBC:    Component Value Date/Time   WBC 12.3* 12/11/2015 0726   HGB 8.8* 12/11/2015 0726   HCT 26.6* 12/11/2015 0726   PLT 234 12/11/2015 0726   MCV 85.1 12/11/2015 0726   NEUTROABS 4.0 05/29/2015 1450   LYMPHSABS 0.8 05/29/2015 1450   MONOABS 0.7 05/29/2015 1450   EOSABS 0.3 05/29/2015 1450   BASOSABS 0.0 05/29/2015 1450   Comprehensive Metabolic Panel:    Component Value Date/Time   NA 129* 12/11/2015 0726   K 4.1 12/12/2015 0729   CL 94* 12/10/2015 0702   CO2 21* 12/10/2015 0702   BUN 66* 12/10/2015 0702   CREATININE 9.71* 12/10/2015 0702   GLUCOSE 84 12/10/2015 0702   CALCIUM 7.5* 12/10/2015 0702   AST 33 12/04/2015 1544   ALT 24 12/04/2015 1544   ALKPHOS 84 12/04/2015 1544   BILITOT 0.8 12/04/2015 1544   PROT 7.7 12/04/2015 1544   ALBUMIN 3.2* 12/05/2015 1507     Time Spent:  25 min

## 2015-12-13 NOTE — Progress Notes (Signed)
Subjective:  Patient completed hemodialysis earlier today. He is arousable but maintains very short conversation.   Objective:  Vital signs in last 24 hours:  Temp:  [97.6 F (36.4 C)-98.1 F (36.7 C)] 98.1 F (36.7 C) (03/22 1332) Pulse Rate:  [80-98] 96 (03/22 1332) Resp:  [13-29] 18 (03/22 1332) BP: (120-160)/(71-106) 139/81 mmHg (03/22 1332) SpO2:  [85 %-100 %] 99 % (03/22 1332) Weight:  [73.9 kg (162 lb 14.7 oz)-76.3 kg (168 lb 3.4 oz)] 73.9 kg (162 lb 14.7 oz) (03/22 1340)  Weight change: -1.084 kg (-2 lb 6.3 oz) Filed Weights   12/13/15 0433 12/13/15 0918 12/13/15 1340  Weight: 75.116 kg (165 lb 9.6 oz) 76.3 kg (168 lb 3.4 oz) 73.9 kg (162 lb 14.7 oz)    Intake/Output:    Intake/Output Summary (Last 24 hours) at 12/13/15 1432 Last data filed at 12/13/15 1248  Gross per 24 hour  Intake      0 ml  Output   1500 ml  Net  -1500 ml     Physical Exam: General: Chronically ill-appearing, laying in the bed   HEENT Moist oral mucous membranes   Neck Supple  Pulm/lungs Normal respiratory effort,  Scattered rhonchi  CVS/Heart Irregular rhythm   Abdomen:  Soft, nontender, BS present  Extremities:  + Dependent edema   Neurologic: Awake, follows simple commands, speaks very few words  Skin: No acute rashes   Access: Left arm AV fistula       Basic Metabolic Panel:   Recent Labs Lab 12/10/15 0702 12/11/15 0726 12/12/15 0729  NA 130* 129*  --   K 4.6  --  4.1  CL 94*  --   --   CO2 21*  --   --   GLUCOSE 84  --   --   BUN 66*  --   --   CREATININE 9.71*  --   --   CALCIUM 7.5*  --   --   MG 2.6*  --  2.3     CBC:  Recent Labs Lab 12/07/15 0831 12/08/15 0252 12/09/15 0627 12/10/15 0702 12/11/15 0726  WBC 12.9* 13.6* 12.6* 14.6* 12.3*  HGB 10.0* 9.6* 9.8* 9.7* 8.8*  HCT 30.3* 29.0* 29.8* 29.2* 26.6*  MCV 84.8 84.5 85.4 86.0 85.1  PLT 145* 181 193 199 234      Microbiology:  Recent Results (from the past 720 hour(s))  MRSA PCR Screening      Status: None   Collection Time: 12/04/15 10:40 PM  Result Value Ref Range Status   MRSA by PCR NEGATIVE NEGATIVE Final    Comment:        The GeneXpert MRSA Assay (FDA approved for NASAL specimens only), is one component of a comprehensive MRSA colonization surveillance program. It is not intended to diagnose MRSA infection nor to guide or monitor treatment for MRSA infections.   CULTURE, BLOOD (ROUTINE X 2) w Reflex to PCR ID Panel     Status: None   Collection Time: 12/05/15 10:00 PM  Result Value Ref Range Status   Specimen Description BLOOD RIGHT ANTECUBITAL  Final   Special Requests BOTTLES DRAWN AEROBIC AND ANAEROBIC 5ML  Final   Culture  Setup Time   Final    GRAM POSITIVE COCCI ANAEROBIC BOTTLE ONLY CRITICAL RESULT CALLED TO, READ BACK BY AND VERIFIED WITH: MELISSA El Refugio 12/07/15 1458    Culture STAPHYLOCOCCUS AUREUS ANAEROBIC BOTTLE ONLY   Final   Report Status 12/11/2015 FINAL  Final   Organism ID,  Bacteria STAPHYLOCOCCUS AUREUS  Final      Susceptibility   Staphylococcus aureus - MIC*    CIPROFLOXACIN <=0.5 SENSITIVE Sensitive     ERYTHROMYCIN 4 RESISTANT Resistant     GENTAMICIN <=0.5 SENSITIVE Sensitive     OXACILLIN <=0.25 SENSITIVE Sensitive     TETRACYCLINE <=1 SENSITIVE Sensitive     VANCOMYCIN 1 SENSITIVE Sensitive     TRIMETH/SULFA <=10 SENSITIVE Sensitive     CLINDAMYCIN RESISTANT Resistant     RIFAMPIN <=0.5 SENSITIVE Sensitive     Inducible Clindamycin Value in next row Resistant      POSITIVEINDUCIBLE CLINDAMYCIN RESISTANCE - A positive ICR test is indicative of inducible resistance to macrolides, lincosamides, and type B streptogramin.  This isolate is presumed to be resistant to Clindamycin, however, Clindamycin may still be effective in some patients.     * STAPHYLOCOCCUS AUREUS  Blood Culture ID Panel (Reflexed)     Status: Abnormal   Collection Time: 12/05/15 10:00 PM  Result Value Ref Range Status   Enterococcus species NOT DETECTED NOT  DETECTED Final   Vancomycin resistance NOT DETECTED NOT DETECTED Final   Listeria monocytogenes NOT DETECTED NOT DETECTED Final   Staphylococcus species DETECTED (A) NOT DETECTED Final    Comment: CRITICAL RESULT CALLED TO, READ BACK BY AND VERIFIED WITH: MELISSA MECCIA 12/07/15 1458 MLM    Staphylococcus aureus DETECTED (A) NOT DETECTED Final    Comment: CRITICAL RESULT CALLED TO, READ BACK BY AND VERIFIED WITH: MELISSA MECCIA 12/07/15 1458 MLM    Methicillin resistance NOT DETECTED NOT DETECTED Final   Streptococcus species NOT DETECTED NOT DETECTED Final   Streptococcus agalactiae NOT DETECTED NOT DETECTED Final   Streptococcus pneumoniae NOT DETECTED NOT DETECTED Final   Streptococcus pyogenes NOT DETECTED NOT DETECTED Final   Acinetobacter baumannii NOT DETECTED NOT DETECTED Final   Enterobacteriaceae species NOT DETECTED NOT DETECTED Final   Enterobacter cloacae complex NOT DETECTED NOT DETECTED Final   Escherichia coli NOT DETECTED NOT DETECTED Final   Klebsiella oxytoca NOT DETECTED NOT DETECTED Final   Klebsiella pneumoniae NOT DETECTED NOT DETECTED Final   Proteus species NOT DETECTED NOT DETECTED Final   Serratia marcescens NOT DETECTED NOT DETECTED Final   Carbapenem resistance NOT DETECTED NOT DETECTED Final   Haemophilus influenzae NOT DETECTED NOT DETECTED Final   Neisseria meningitidis NOT DETECTED NOT DETECTED Final   Pseudomonas aeruginosa NOT DETECTED NOT DETECTED Final   Candida albicans NOT DETECTED NOT DETECTED Final   Candida glabrata NOT DETECTED NOT DETECTED Final   Candida krusei NOT DETECTED NOT DETECTED Final   Candida parapsilosis NOT DETECTED NOT DETECTED Final   Candida tropicalis NOT DETECTED NOT DETECTED Final  CULTURE, BLOOD (ROUTINE X 2) w Reflex to PCR ID Panel     Status: None   Collection Time: 12/05/15 10:05 PM  Result Value Ref Range Status   Specimen Description BLOOD BLOOD RIGHT FOREARM  Final   Special Requests BOTTLES DRAWN AEROBIC AND  ANAEROBIC 5ML  Final   Culture NO GROWTH 5 DAYS  Final   Report Status 12/10/2015 FINAL  Final  CULTURE, BLOOD (ROUTINE X 2) w Reflex to PCR ID Panel     Status: None   Collection Time: 12/08/15  2:52 PM  Result Value Ref Range Status   Specimen Description BLOOD RIGHT HAND  Final   Special Requests   Final    BOTTLES DRAWN AEROBIC AND ANAEROBIC 8CC AERO 6CC ANA   Culture NO GROWTH 5 DAYS  Final  Report Status 12/13/2015 FINAL  Final  CULTURE, BLOOD (ROUTINE X 2) w Reflex to PCR ID Panel     Status: None   Collection Time: 12/08/15  6:19 PM  Result Value Ref Range Status   Specimen Description BLOOD RIGHT WRIST  Final   Special Requests   Final    BOTTLES DRAWN AEROBIC AND ANAEROBIC [place]   Culture NO GROWTH 5 DAYS  Final   Report Status 12/13/2015 FINAL  Final    Coagulation Studies: No results for input(s): LABPROT, INR in the last 72 hours.  Urinalysis: No results for input(s): COLORURINE, LABSPEC, PHURINE, GLUCOSEU, HGBUR, BILIRUBINUR, KETONESUR, PROTEINUR, UROBILINOGEN, NITRITE, LEUKOCYTESUR in the last 72 hours.  Invalid input(s): APPERANCEUR    Imaging: No results found.   Medications:     . amiodarone  400 mg Oral BID  . aspirin EC  325 mg Oral Daily  .  ceFAZolin (ANCEF) IV  2 g Intravenous Q M,W,F-2000  . chlorhexidine  15 mL Mouth Rinse BID  . epoetin (EPOGEN/PROCRIT) injection  4,000 Units Intravenous Q M,W,F-HD  . heparin subcutaneous  5,000 Units Subcutaneous 3 times per day  . insulin aspart  0-5 Units Subcutaneous QHS  . insulin aspart  0-9 Units Subcutaneous TID WC  . latanoprost  1 drop Both Eyes QHS  . metoprolol tartrate  25 mg Oral BID  . mirtazapine  15 mg Oral QHS  . sevelamer carbonate  2,400 mg Oral TID WC  . sodium chloride flush  3 mL Intravenous Q12H   sodium chloride, acetaminophen **OR** acetaminophen, ipratropium-albuterol, labetalol, lidocaine-prilocaine, ondansetron **OR** ondansetron (ZOFRAN) IV, polyethylene  glycol  Assessment/ Plan:  65 y.o. male with end-stage renal disease, diabetes, hypertension [place] [facility]/ [facility]  1. End-stage renal disease with hyperkalemia - Patient completed hemodialysis today.  He tolerated this quite well. Next dialysis treatment on Friday.  2. Atrial fibrillation - Continue metoprolol for rate control as well as amiodarone.  3. Anemia of chronic kidney disease - ontinue Epogen with dialysis.  4. SHPTH - Continue Renvela 2400 mg by mouth 3 times a day with meals or phosphorous control.  Periodically monitor bone mineral metabolism parameters.  5.  Altered mental status:  Patient's mental status has not improved significantly since admission. He's been seen by neurology and psychiatry.    LOS: 9 Denson Niccoli 3/22/20172:32 PM

## 2015-12-13 NOTE — Progress Notes (Signed)
CSW spoke to patient's mother. She reports that they retrieved patient's clothing from the [facility] but "it's not all of his stuff". CSW informed patient's mother that she would have to inquire about patient's clothes with the two group homes he's recently left. She reports that patient's brother is no longer helping with patient's care. CSW explained that patient would be transported via EMS at discharge and his personal belongings will be sent with him via EMS. Inquired about assistance with activating his debit card. CSW informed her that a family member would have to assist patient with activating his debit card.   Spoke to patient's brother. He reports that he got into an argument with patient's mother and has somewhat withdrew himself form assisting patient. Reported that he can be called but patient's mother will make major decisions. CSW explained to him about patient's personal belongings. He reported he picked patient's personal belongings up yesterday. He stated he understood that CSW cannot help patient activate his debt card and stated he'll see if family can assist him with it. CSW will continue to follow and assist.  Ernest Pine, MSW, [place] Work Department [phone]

## 2015-12-13 NOTE — H&P (Signed)
[facility] NAME: Noah Lewis    MR#:  [id number]  DATE OF BIRTH:  [birth year]  SUBJECTIVE:  CHIEF COMPLAINT:   Chief Complaint  Patient presents with  . Back Pain  Admitted for SOB, missing HD, refusing treatment.  Bcx from [facility] grew staph aureus. Permcath removed.  Patient more awake than I saw him 4 days back. Nothing acute overnight    REVIEW OF SYSTEMS:   ROS unobtainable due to altered mental status  DRUG ALLERGIES:   Allergies  Allergen Reactions  . Penicillins Other (See Comments)    Reaction:  Unknown     VITALS:  Blood pressure 139/81, pulse 96, temperature 98.1 F (36.7 C), temperature source Oral, resp. rate 18, height 5\' 9"  (1.753 m), weight 73.9 kg (162 lb 14.7 oz), SpO2 99 %.  PHYSICAL EXAMINATION:  GENERAL:  65 y.o.-year-old patient lying in the bed , looking around, shaking or nodding head, overall poorly responsive eyes: Pupils equal, round, reactive to light and accommodation. No scleral icterus. Extraocular muscles intact.  HEENT: Head atraumatic, normocephalic. Oropharynx and nasopharynx clear.  NECK:  Supple, no jugular venous distention. No thyroid enlargement, no tenderness.  LUNGS: Bilateral rhonchi, snoring, poor air entry intermittently due to obstructive symptoms. CARDIOVASCULAR: S1, S2 normal. No murmurs, rubs, or gallops.  ABDOMEN: Soft, nontender, nondistended. Bowel sounds present. No organomegaly or mass.  EXTREMITIES: No pedal edema, cyanosis, or clubbing.  NEUROLOGIC: Not moving upper or lower extremities.Does not follow commands. PSYCHIATRIC: The patient is drowsy SKIN: No obvious rash, lesion, or ulcer.    LABORATORY PANEL:   CBC  Recent Labs Lab 12/11/15 0726  WBC 12.3*  HGB 8.8*  HCT 26.6*  PLT 234   ------------------------------------------------------------------------------------------------------------------  Chemistries   Recent Labs Lab  12/10/15 0702 12/11/15 0726 12/12/15 0729  NA 130* 129*  --   K 4.6  --  4.1  CL 94*  --   --   CO2 21*  --   --   GLUCOSE 84  --   --   BUN 66*  --   --   CREATININE 9.71*  --   --   CALCIUM 7.5*  --   --   MG 2.6*  --  2.3   ------------------------------------------------------------------------------------------------------------------  Cardiac Enzymes No results for input(s): TROPONINI in the last 168 hours. ------------------------------------------------------------------------------------------------------------------  RADIOLOGY:  No results found.  EKG:   Orders placed or performed during the hospital encounter of 12/04/15  . ED EKG  . ED EKG    ASSESSMENT AND PLAN:   * Acute encephalopathy Seems to be multifactorial due to depression And acute infection. Ammonia, B12, TSH levels were normal.  CT scan of the head without contrast was negative for acute intracranial process.Appreciated . Neurology input, metabolic encephalopathy was suggested.  MRI of brain showed no acute infarct, chronic microvascular ischemic changes. Some better overall, more alert, however, still not communicating.   * Acute on chronic diastolic CHF with acute hypoxic respiratory failure - off  Nonrebreather, on nasal cannulas, at 5 L. Chest x-ray showed pulmonary edema.  Continue dialysis  * New onset Atrial fibrillation On Lopressor  * MSSA bacteremia Blood cultures drawn at the dialysis center on 12/02/2015. cultures repeated in the hospital on 12/05/2015 Have MSSA. [name] recommends Ancef with hemodialysis for 6 weeks since negative blood cultures 12/08/2015 Until 01/19/2016 MRI of the lumbar spine revealed small fluid collection, unlikely abscess per surgery.  PermCath removed  *  Hyperkalemia with end-stage renal disease. Resolved  * Hypotension,.Resolved, off medications  Holding labetalol and cardizem. Vasotec held due to hyperkalemia on admission. Stable blood  pressure readings at present and restart blood pressure medications as tolerated.  * Severe depression on Remeron.   * Chronic low back pain. MRI of lumbar spine did not show any acute abnormalities except for a small cyst in paraspinal area, unlikely abscess per surgery, no intervention was recommended. Due to encephalopathy opiates are placed on hold  * Diabetes mellitus Sliding scale due to unpredictable oral intake.  * DVT prophylaxis. Patient is on heparin subcutaneously.  *Hyponatremia, likely due to fluid overload  * Patient is incompetent to make decisions for medical care Brother is healthcare power of attorney. Mother refused.  All the records are reviewed and case discussed with Care Management/Social Workerr. Management plans discussed with the patient, mother and they are in agreement.  CODE STATUS: full code.  TOTAL CC TIME TAKING CARE OF THIS PATIENT: 35 minutes.   POSSIBLE D/C IN 3-4 DAYS, DEPENDING ON CLINICAL CONDITION.  Hillary Bow R M.D on 12/13/2015 at 2:38 PM  Between 7am to 6pm - Pager - [phone]  After 6pm go to [email or web address] - password [email or web address]  Office  [phone]  CC: Primary care physician; Elyse Jarvis, MD

## 2015-12-13 NOTE — Progress Notes (Signed)
Post HD  

## 2015-12-13 NOTE — Consult Note (Signed)
Pharmacy Antibiotic Note  Noah Lewis is a 65 y.o. male admitted on 12/04/2015 with bacteremia.  Pharmacy has been consulted for cefazolin dosing. Pt is an ESRD pt on HD MWF  Plan: Continue Cefazolin 2g AFTER HD on MWF- pt will need tx for 6 weeks from neg blood cultures per Dr. Clayton Bibles- 28 Apr    Height: 5\' 9"  (175.3 cm) Weight: 168 lb 3.4 oz (76.3 kg) IBW/kg (Calculated) : 70.7  Temp (24hrs), Avg:97.8 F (36.6 C), Min:97.6 F (36.4 C), Max:98 F (36.7 C)   Recent Labs Lab 12/06/15 1309 12/07/15 0831 12/08/15 0252 12/09/15 0627 12/10/15 0702 12/11/15 0726  WBC 11.0* 12.9* 13.6* 12.6* 14.6* 12.3*  CREATININE 9.94*  --   --   --  9.71*  --   VANCORANDOM  --   --   --   --   --  16    Estimated Creatinine Clearance: 7.7 mL/min (by C-G formula based on Cr of 9.71).    Allergies  Allergen Reactions  . Penicillins Other (See Comments)    Reaction:  Unknown     Antimicrobials this admission: Vancomycin 3/14 >> 3/20 Meropenem 3/15 >> 3/16 Cefazolin 3/20>>  Dose adjustments this admission:   Microbiology results: 3/14 BCx: staph aureus MSSA Blood cx from [facility]: GPC in clusters in 2/2 per MD  3/13 MRSA PCR: negative 3/17 Bcx- no growth 3 days  Thank you for allowing pharmacy to be a part of this patient's care.  Ramond Dial 12/13/2015 11:44 AM

## 2015-12-13 NOTE — Consult Note (Signed)
  Psychiatry: Follow-up visit on Wednesday the 22nd. Patient had dialysis today. As usual he is refusing to speak to me. I spoke his name loudly multiple times and he did not open his eyes. Vital signs appear stable. He is apparently cooperative with dialysis. No change to current medication treatment. I will follow-up as needed.

## 2015-12-13 NOTE — Progress Notes (Signed)
Tx ended    

## 2015-12-14 ENCOUNTER — Inpatient Hospital Stay: Payer: Medicaid Other

## 2015-12-14 LAB — CBC
HCT: 25.9 % — ABNORMAL LOW (ref 40.0–52.0)
HEMOGLOBIN: 8.5 g/dL — AB (ref 13.0–18.0)
MCH: 28.2 pg (ref 26.0–34.0)
MCHC: 32.8 g/dL (ref 32.0–36.0)
MCV: 86 fL (ref 80.0–100.0)
PLATELETS: 297 10*3/uL (ref 150–440)
RBC: 3.01 MIL/uL — ABNORMAL LOW (ref 4.40–5.90)
RDW: 17.3 % — ABNORMAL HIGH (ref 11.5–14.5)
WBC: 11.9 10*3/uL — AB (ref 3.8–10.6)

## 2015-12-14 LAB — GLUCOSE, CAPILLARY
GLUCOSE-CAPILLARY: 102 mg/dL — AB (ref 65–99)
GLUCOSE-CAPILLARY: 111 mg/dL — AB (ref 65–99)
Glucose-Capillary: 87 mg/dL (ref 65–99)

## 2015-12-14 MED ORDER — APIXABAN 5 MG PO TABS: 5.0000 mg | ORAL_TABLET | Freq: Two times a day (BID) | ORAL | Status: DC

## 2015-12-14 MED ORDER — APIXABAN 5 MG PO TABS
10.0000 mg | ORAL_TABLET | Freq: Two times a day (BID) | ORAL | Status: DC
  Administered 2015-12-14 – 2015-12-15 (×2): 10 mg via ORAL
  Filled 2015-12-14 (×2): qty 2

## 2015-12-14 MED ORDER — CEFAZOLIN SODIUM-DEXTROSE 2-4 GM/100ML-% IV SOLN: 2.0000 g | INTRAVENOUS | Status: DC

## 2015-12-14 MED ORDER — AMIODARONE HCL 400 MG PO TABS: 400.0000 mg | ORAL_TABLET | Freq: Every day | ORAL | Status: AC

## 2015-12-14 MED ORDER — METOPROLOL TARTRATE 25 MG PO TABS: 25.0000 mg | ORAL_TABLET | Freq: Two times a day (BID) | ORAL | Status: DC

## 2015-12-14 MED ORDER — MIRTAZAPINE 15 MG PO TBDP: 15.0000 mg | ORAL_TABLET | Freq: Every day | ORAL | Status: DC

## 2015-12-14 NOTE — Progress Notes (Signed)
Palliative Care Update  In reviewing my two notes on patient, I have noticed that I did not reference my conversation that I had with pts mother by phone last week (after I entered note on 3/15).  I did talk with her about pt's condition, behaviors, depression, and the situation associated with his two children being put up for adoption soon and having their visits to him stopped.  I also brought up code status. She listened but said she did not feel comfortable making a decision about code status for him. She seemed to think this should be his decision, but of course, pt has not had capacity to make decisions rationally recently.   Pt remains full code until he can be talked to about this subject matter or until his mother finds that he is in such a dire situation that she might feel more comfortable making a decision about code status for him.   Colleen Can, MD

## 2015-12-14 NOTE — Discharge Instructions (Signed)
°  DIET:  Renal diet  DISCHARGE CONDITION:  Stable  ACTIVITY:  Activity as tolerated  OXYGEN:  Home Oxygen: Yes.     Oxygen Delivery: 2 liters/min via Patient connected to nasal cannula oxygen  DISCHARGE LOCATION:  nursing home   If you experience worsening of your admission symptoms, develop shortness of breath, life threatening emergency, suicidal or homicidal thoughts you must seek medical attention immediately by calling 911 or calling your MD immediately  if symptoms less severe.  You Must read complete instructions/literature along with all the possible adverse reactions/side effects for all the Medicines you take and that have been prescribed to you. Take any new Medicines after you have completely understood and accpet all the possible adverse reactions/side effects.   Please note  You were cared for by a hospitalist during your hospital stay. If you have any questions about your discharge medications or the care you received while you were in the hospital after you are discharged, you can call the unit and asked to speak with the hospitalist on call if the hospitalist that took care of you is not available. Once you are discharged, your primary care physician will handle any further medical issues. Please note that NO REFILLS for any discharge medications will be authorized once you are discharged, as it is imperative that you return to your primary care physician (or establish a relationship with a primary care physician if you do not have one) for your aftercare needs so that they can reassess your need for medications and monitor your lab values.   Cefazolin IV at hemodialysis till 01/19/2016.

## 2015-12-14 NOTE — Consult Note (Signed)
  Psychiatry: Follow-up for this 65 year old man with past history of possible bipolar disorder also on dialysis multiple medical problems. I have been following regularly with him and have been frustrated in attempts to establish any communication. Today when I came to see the patient he was sitting up and appeared to of been eating. He looked like he closed his eyes when I came into the room although when I talked to him he eventually did open his eyes and made a couple of grunting responses to me. He even asked me at one point how I was doing. He then went back to ignoring me and didn't answer any more detail questions. Nonetheless this is a better conversation than I have ever had with him yet. Seems to possibly be a little bit stronger today.  Patient seems more awake more physically active and stronger. It's a small improvement but it's better than nothing. Continue current medications and I will continue to try and follow-up as best I can.

## 2015-12-14 NOTE — Progress Notes (Signed)
ANTICOAGULATION CONSULT NOTE - Initial Consult  Pharmacy Consult for apixaban  Indication: DVT  Allergies  Allergen Reactions  . Penicillins Other (See Comments)    Reaction:  Unknown    Patient Measurements: Height: 5\' 9"  (175.3 cm) Weight: 160 lb 12.8 oz (72.938 kg) IBW/kg (Calculated) : 70.7  Vital Signs: Temp: 97.8 F (36.6 C) (03/23 1127) Temp Source: Oral (03/23 1127) BP: 126/80 mmHg (03/23 1127) Pulse Rate: 84 (03/23 1127)  Labs:  Recent Labs  12/14/15 0649  HGB 8.5*  HCT 25.9*  PLT 297    Estimated Creatinine Clearance: 7.7 mL/min (by C-G formula based on Cr of 9.71).  Medical History: Past Medical History  Diagnosis Date  . Cancer ([place])   . Arthritis   . Renal disorder   . Hypertension   . Diabetes mellitus without complication ([place])   . Asthma   . COPD (chronic obstructive pulmonary disease) ([place])   . Coronary artery disease    Assessment: Pharmacy consulted to dose and monitor apixaban in this 65 year old male who is on HD and developed a LUE brachial vein DVT.   Baseline labs: Hgb 8.5, Plt 297  Goal of Therapy:  Monitor platelets by anticoagulation protocol: Yes   Plan:  Discontinued subcutaneous heparin TID Apixaban 10 mg PO BID x 7 days followed by apixaban 5 mg PO BID as there is not a recommended dose reduction for renal function in DVT treatment.   Lenis Noon, PharmD 12/14/2015,7:02 PM

## 2015-12-14 NOTE — Discharge Summary (Addendum)
[place] [facility] [place] NAME: Noah Lewis    MR#:  [id number]  DATE OF BIRTH:  25-Jul-1951  DATE OF ADMISSION:  12/04/2015 ADMITTING PHYSICIAN: Hillary Bow, MD  DATE OF DISCHARGE: 12/15/2015  PRIMARY CARE PHYSICIAN: Elyse Jarvis, MD   ADMISSION DIAGNOSIS:  Atrial fibrillation, unspecified type ([place]) [I48.91]  DISCHARGE DIAGNOSIS:  Active Problems:   Bipolar depression ([place])   A-fib (HCC)   Bacteremia   Back pain   SECONDARY DIAGNOSIS:   Past Medical History  Diagnosis Date  . Cancer ([place])   . Arthritis   . Renal disorder   . Hypertension   . Diabetes mellitus without complication ([place])   . Asthma   . COPD (chronic obstructive pulmonary disease) ([place])   . Coronary artery disease      ADMITTING HISTORY  Noah Lewis is a 65 y.o. male with a known history of hypertension, diabetes, incision disease on hemodialysis and depression presents to the emergency room sent in from group home after he complained of back pain and refused dialysis in medications. History has been obtained from family at bedside, nursing staff and reviewing old records. Patient is poor historian. Other than complaining of back and lower extremity pain he is unable to give me any history. She refused dialysis earlier today and received only one hour of dialysis on Friday. He has been found to have potassium of 6.1. Chest x-ray shows no fluid overload. He does have new onset atrial fibrillation on the EKG. Cardizem 15 mg IV 1 time given with inadequate heart rate control and hospitalist was consult.  HOSPITAL COURSE:   * Acute encephalopathy Seems to be multifactorial due to depression And acute infection. Ammonia, B12, TSH levels were normal. CT scan of the head without contrast was negative for acute intracranial process.Appreciated . Neurology input, metabolic encephalopathy was suggested. MRI of brain showed no acute infarct, chronic  microvascular ischemic changes. Improved. Likely due to depression  * Acute on chronic diastolic CHF with acute hypoxic respiratory failure - off Nonrebreather, on nasal cannulas, at 2 L. Chest x-ray showed improved pulmonary edema.  Continue dialysis as OP  * New onset Atrial fibrillation On Lopressor  * MSSA bacteremia Blood cultures drawn at the dialysis center on 12/02/2015. cultures repeated in the hospital on 12/05/2015 Have MSSA. [name] recommends Ancef with hemodialysis for 6 weeks since negative blood cultures 12/08/2015 Until 01/19/2016 MRI of the lumbar spine revealed small fluid collection, unlikely abscess per surgery.  PermCath removed  * Hyperkalemia with end-stage renal disease. Resolved Continue HD as OP  * Hypotension,.Resolved Now on Metoprolol and cardizem  * Severe depression on Remeron.   * Chronic low back pain. MRI of lumbar spine did not show any acute abnormalities except for a small cyst in paraspinal area, unlikely abscess per surgery, no intervention was recommended. Due to encephalopathy opiates have been discontinued  * Diabetes mellitus Sliding scale due to unpredictable oral intake.  * DVT prophylaxis. Patient is on heparin subcutaneously.  *Hyponatremia, likely due to fluid overload  * Patient is incompetent to make decisions for medical care Brother is healthcare power of attorney. Mother refused.  Stable for discharge to SNF  CONSULTS OBTAINED:  Treatment Team:  Demetrios Loll, MD Murlean Iba, MD Dionisio David, MD Gonzella Lex, MD Adrian Prows, MD Alexis Goodell, MD Katha Cabal, MD  DRUG ALLERGIES:   Allergies  Allergen Reactions  . Penicillins Other (See Comments)  Reaction:  Unknown     DISCHARGE MEDICATIONS:   Current Discharge Medication List    START taking these medications   Details  amiodarone (PACERONE) 400 MG tablet Take 1 tablet (400 mg total) by mouth daily. Qty: 30 tablet,  Refills: 0    apixaban (ELIQUIS) 5 MG TABS tablet Take 1 tablet (5 mg total) by mouth 2 (two) times daily. 10mg  BID for 6 days and then 5 mg BID - Total 3 months    ceFAZolin (ANCEF) 2-4 GM/100ML-% IVPB Inject 100 mLs (2 g total) into the vein every Monday, Wednesday, and Friday at 8 PM.    metoprolol tartrate (LOPRESSOR) 25 MG tablet Take 1 tablet (25 mg total) by mouth 2 (two) times daily.    mirtazapine (REMERON SOL-TAB) 15 MG disintegrating tablet Take 1 tablet (15 mg total) by mouth at bedtime.      CONTINUE these medications which have NOT CHANGED   Details  acetaminophen (TYLENOL) 325 MG tablet Take 650 mg by mouth every 6 (six) hours as needed for mild pain, fever or headache.    aspirin 81 MG chewable tablet Chew 1 tablet (81 mg total) by mouth daily. Qty: 30 tablet, Refills: 11    b complex-vitamin c-folic acid (NEPHRO-VITE) 0.8 MG TABS tablet Take 1 tablet by mouth 2 (two) times daily.    capsaicin (ZOSTRIX) 0.025 % cream Apply 1 application topically 2 (two) times daily as needed (for lower back pain).    ipratropium-albuterol (DUONEB) 0.5-2.5 (3) MG/3ML SOLN Take 3 mLs by nebulization every 6 (six) hours as needed (for wheezing/shortness of breath).    loratadine (CLARITIN) 10 MG tablet Take 1 tablet (10 mg total) by mouth daily. Qty: 30 tablet, Refills: 11   Associated Diagnoses: COPD, moderate (HCC)    polyethylene glycol (MIRALAX / GLYCOLAX) packet Take 17 g by mouth daily as needed for mild constipation. Qty: 14 each, Refills: 0    sevelamer carbonate (RENVELA) 800 MG tablet Take 2,400 mg by mouth 3 (three) times daily with meals.    Travoprost, BAK Free, (TRAVATAN) 0.004 % SOLN ophthalmic solution Place 1 drop into both eyes at bedtime.      STOP taking these medications     labetalol (NORMODYNE) 200 MG tablet      enalapril (VASOTEC) 5 MG tablet      oxyCODONE-acetaminophen (ROXICET) 5-325 MG tablet         Today   VITAL SIGNS:  Blood pressure  142/81, pulse 91, temperature 98.1 F (36.7 C), temperature source Oral, resp. rate 16, height 5\' 9"  (1.753 m), weight 72.938 kg (160 lb 12.8 oz), SpO2 98 %.  I/O:    Intake/Output Summary (Last 24 hours) at 12/15/15 0740 Last data filed at 12/15/15 0002  Gross per 24 hour  Intake    480 ml  Output      0 ml  Net    480 ml    PHYSICAL EXAMINATION:  Physical Exam  GENERAL:  65 y.o.-year-old patient lying in the bed with no acute distress.  LUNGS: Normal breath sounds bilaterally, no wheezing, rales,rhonchi or crepitation. No use of accessory muscles of respiration.  CARDIOVASCULAR: S1, S2 normal. No murmurs, rubs, or gallops.  ABDOMEN: Soft, non-tender, non-distended. Bowel sounds present. No organomegaly or mass.  NEUROLOGIC: Motor strength 5/5 upper and lower extremities PSYCHIATRIC: The patient is alert and awake. Flat affect SKIN: No obvious rash, lesion, or ulcer.  LUE fistula  DATA REVIEW:   CBC  Recent Labs Lab 12/14/15  0649  WBC 11.9*  HGB 8.5*  HCT 25.9*  PLT 297    Chemistries   Recent Labs Lab 12/10/15 0702 12/11/15 0726 12/12/15 0729  NA 130* 129*  --   K 4.6  --  4.1  CL 94*  --   --   CO2 21*  --   --   GLUCOSE 84  --   --   BUN 66*  --   --   CREATININE 9.71*  --   --   CALCIUM 7.5*  --   --   MG 2.6*  --  2.3    Cardiac Enzymes No results for input(s): TROPONINI in the last 168 hours.  Microbiology Results  Results for orders placed or performed during the hospital encounter of 12/04/15  MRSA PCR Screening     Status: None   Collection Time: 12/04/15 10:40 PM  Result Value Ref Range Status   MRSA by PCR NEGATIVE NEGATIVE Final    Comment:        The GeneXpert MRSA Assay (FDA approved for NASAL specimens only), is one component of a comprehensive MRSA colonization surveillance program. It is not intended to diagnose MRSA infection nor to guide or monitor treatment for MRSA infections.   CULTURE, BLOOD (ROUTINE X 2) w Reflex to  PCR ID Panel     Status: None   Collection Time: 12/05/15 10:00 PM  Result Value Ref Range Status   Specimen Description BLOOD RIGHT ANTECUBITAL  Final   Special Requests BOTTLES DRAWN AEROBIC AND ANAEROBIC 5ML  Final   Culture  Setup Time   Final    GRAM POSITIVE COCCI ANAEROBIC BOTTLE ONLY CRITICAL RESULT CALLED TO, READ BACK BY AND VERIFIED WITH: MELISSA Quinlan 12/07/15 1458    Culture STAPHYLOCOCCUS AUREUS ANAEROBIC BOTTLE ONLY   Final   Report Status 12/11/2015 FINAL  Final   Organism ID, Bacteria STAPHYLOCOCCUS AUREUS  Final      Susceptibility   Staphylococcus aureus - MIC*    CIPROFLOXACIN <=0.5 SENSITIVE Sensitive     ERYTHROMYCIN 4 RESISTANT Resistant     GENTAMICIN <=0.5 SENSITIVE Sensitive     OXACILLIN <=0.25 SENSITIVE Sensitive     TETRACYCLINE <=1 SENSITIVE Sensitive     VANCOMYCIN 1 SENSITIVE Sensitive     TRIMETH/SULFA <=10 SENSITIVE Sensitive     CLINDAMYCIN RESISTANT Resistant     RIFAMPIN <=0.5 SENSITIVE Sensitive     Inducible Clindamycin Value in next row Resistant      POSITIVEINDUCIBLE CLINDAMYCIN RESISTANCE - A positive ICR test is indicative of inducible resistance to macrolides, lincosamides, and type B streptogramin.  This isolate is presumed to be resistant to Clindamycin, however, Clindamycin may still be effective in some patients.     * STAPHYLOCOCCUS AUREUS  Blood Culture ID Panel (Reflexed)     Status: Abnormal   Collection Time: 12/05/15 10:00 PM  Result Value Ref Range Status   Enterococcus species NOT DETECTED NOT DETECTED Final   Vancomycin resistance NOT DETECTED NOT DETECTED Final   Listeria monocytogenes NOT DETECTED NOT DETECTED Final   Staphylococcus species DETECTED (A) NOT DETECTED Final    Comment: CRITICAL RESULT CALLED TO, READ BACK BY AND VERIFIED WITH: MELISSA MECCIA 12/07/15 1458 MLM    Staphylococcus aureus DETECTED (A) NOT DETECTED Final    Comment: CRITICAL RESULT CALLED TO, READ BACK BY AND VERIFIED WITH: MELISSA MECCIA  12/07/15 1458 MLM    Methicillin resistance NOT DETECTED NOT DETECTED Final   Streptococcus species NOT DETECTED NOT DETECTED  Final   Streptococcus agalactiae NOT DETECTED NOT DETECTED Final   Streptococcus pneumoniae NOT DETECTED NOT DETECTED Final   Streptococcus pyogenes NOT DETECTED NOT DETECTED Final   Acinetobacter baumannii NOT DETECTED NOT DETECTED Final   Enterobacteriaceae species NOT DETECTED NOT DETECTED Final   Enterobacter cloacae complex NOT DETECTED NOT DETECTED Final   Escherichia coli NOT DETECTED NOT DETECTED Final   Klebsiella oxytoca NOT DETECTED NOT DETECTED Final   Klebsiella pneumoniae NOT DETECTED NOT DETECTED Final   Proteus species NOT DETECTED NOT DETECTED Final   Serratia marcescens NOT DETECTED NOT DETECTED Final   Carbapenem resistance NOT DETECTED NOT DETECTED Final   Haemophilus influenzae NOT DETECTED NOT DETECTED Final   Neisseria meningitidis NOT DETECTED NOT DETECTED Final   Pseudomonas aeruginosa NOT DETECTED NOT DETECTED Final   Candida albicans NOT DETECTED NOT DETECTED Final   Candida glabrata NOT DETECTED NOT DETECTED Final   Candida krusei NOT DETECTED NOT DETECTED Final   Candida parapsilosis NOT DETECTED NOT DETECTED Final   Candida tropicalis NOT DETECTED NOT DETECTED Final  CULTURE, BLOOD (ROUTINE X 2) w Reflex to PCR ID Panel     Status: None   Collection Time: 12/05/15 10:05 PM  Result Value Ref Range Status   Specimen Description BLOOD BLOOD RIGHT FOREARM  Final   Special Requests BOTTLES DRAWN AEROBIC AND ANAEROBIC 5ML  Final   Culture NO GROWTH 5 DAYS  Final   Report Status 12/10/2015 FINAL  Final  CULTURE, BLOOD (ROUTINE X 2) w Reflex to PCR ID Panel     Status: None   Collection Time: 12/08/15  2:52 PM  Result Value Ref Range Status   Specimen Description BLOOD RIGHT HAND  Final   Special Requests   Final    BOTTLES DRAWN AEROBIC AND ANAEROBIC 8CC AERO 6CC ANA   Culture NO GROWTH 5 DAYS  Final   Report Status 12/13/2015 FINAL   Final  CULTURE, BLOOD (ROUTINE X 2) w Reflex to PCR ID Panel     Status: None   Collection Time: 12/08/15  6:19 PM  Result Value Ref Range Status   Specimen Description BLOOD RIGHT WRIST  Final   Special Requests   Final    BOTTLES DRAWN AEROBIC AND ANAEROBIC 11CCAERO,8CCANA   Culture NO GROWTH 5 DAYS  Final   Report Status 12/13/2015 FINAL  Final    RADIOLOGY:  No results found.  Follow up with PCP in 1 week.  Management plans discussed with the patient, family and they are in agreement.  CODE STATUS:     Code Status Orders        Start     Ordered   12/04/15 1914  Full code   Continuous     12/04/15 1914    Code Status History    Date Active Date Inactive Code Status Order ID Comments User Context   09/20/2015 10:27 PM 09/22/2015  8:56 PM Full Code [id number]  Lytle Butte, MD ED   05/29/2015  8:45 PM 06/02/2015 10:34 PM Full Code [id number]  Vickii Chafe, MD Inpatient   05/13/2015  6:10 AM 05/23/2015  7:21 PM Full Code [id number]  Corky Sox, MD Inpatient   04/28/2015 11:51 AM 05/03/2015  5:40 PM Full Code [id number]  [name], MD Inpatient      TOTAL TIME TAKING CARE OF THIS PATIENT ON DAY OF DISCHARGE: more than 30 minutes.   Hillary Bow R M.D on 12/14/2015 at 1:16 PM  Between 7am to  6pm - Pager - [phone]  After 6pm go to [email or web address] - password [email or web address] [facility]  Office  [phone]  CC: Primary care physician; Elyse Jarvis, MD  Note: This dictation was prepared with Dragon dictation along with smaller phrase technology. Any transcriptional errors that result from this process are unintentional.

## 2015-12-14 NOTE — Progress Notes (Signed)
[facility] at [place] NAME: Noah Lewis    MR#:  [id number]  DATE OF BIRTH:  Jul 13, 1951  SUBJECTIVE:  CHIEF COMPLAINT:   Chief Complaint  Patient presents with  . Back Pain  Admitted for SOB, missing HD, refusing treatment.  [facility]cx from [facility] grew staph aureus. Permcath removed.  Answering some questions. Good appetite.    REVIEW OF SYSTEMS:   ROS unobtainable due to altered mental status  DRUG ALLERGIES:   Allergies  Allergen Reactions  . Penicillins Other (See Comments)    Reaction:  Unknown     VITALS:  Blood pressure 126/80, pulse 84, temperature 97.8 F (36.6 C), temperature source Oral, resp. rate 19, height 5\' 9"  (1.753 m), weight 72.938 kg (160 lb 12.8 oz), SpO2 99 %.  PHYSICAL EXAMINATION:  GENERAL:  65 y.o.-year-old patient lying in the bed , looking around, shaking or nodding head, overall poorly responsive eyes: Pupils equal, round, reactive to light and accommodation. No scleral icterus. Extraocular muscles intact.  HEENT: Head atraumatic, normocephalic. Oropharynx and nasopharynx clear.  NECK:  Supple, no jugular venous distention. No thyroid enlargement, no tenderness.  LUNGS: Bilateral rhonchi, snoring, poor air entry intermittently due to obstructive symptoms. CARDIOVASCULAR: S1, S2 normal. No murmurs, rubs, or gallops.  ABDOMEN: Soft, nontender, nondistended. Bowel sounds present. No organomegaly or mass.  EXTREMITIES: No pedal edema, cyanosis, or clubbing.  NEUROLOGIC: Not moving upper or lower extremities.Does not follow commands. PSYCHIATRIC: The patient is drowsy SKIN: No obvious rash, lesion, or ulcer.    LABORATORY PANEL:   CBC  Recent Labs Lab 12/14/15 0649  WBC 11.9*  HGB 8.5*  HCT 25.9*  PLT 297   ------------------------------------------------------------------------------------------------------------------  Chemistries   Recent Labs Lab 12/10/15 0702 12/11/15 0726  12/12/15 0729  NA 130* 129*  --   K 4.6  --  4.1  CL 94*  --   --   CO2 21*  --   --   GLUCOSE 84  --   --   BUN 66*  --   --   CREATININE 9.71*  --   --   CALCIUM 7.5*  --   --   MG 2.6*  --  2.3   ------------------------------------------------------------------------------------------------------------------  Cardiac Enzymes No results for input(s): TROPONINI in the last 168 hours. ------------------------------------------------------------------------------------------------------------------  RADIOLOGY:  US Venous Img Upper Uni Left  12/14/2015  CLINICAL DATA:  Left arm swelling EXAM: LEFT UPPER EXTREMITY VENOUS DUPLEX ULTRASOUND TECHNIQUE: Doppler venous assessment of the left upper extremity deep venous system was performed, including characterization of spectral flow, compressibility, and phasicity. COMPARISON:  None. FINDINGS: The left jugular, axillary, and subclavian veins are compressible and patent. There is nonocclusive thrombus within the brachial vein adjacent to the shoulder that is partially compressible. The mid and distal brachial vein are compressible and patent. Radial and ulnar veins are patent. Doppler analysis demonstrates a phasic venous waveform. IMPRESSION: The study is positive for nonocclusive DVT in a short segment of the brachial vein adjacent to the left shoulder. Electronically Signed   By: Marybelle Killings M.D.   On: 12/14/2015 15:53    EKG:   Orders placed or performed during the hospital encounter of 12/04/15  . ED EKG  . ED EKG    ASSESSMENT AND PLAN:   * LUE brachial vein non occlusive DVT Start Eliquis  * Acute encephalopathy Seems to be multifactorial due to depression And acute infection. Ammonia, B12, TSH levels were normal.  CT scan  of the head without contrast was negative for acute intracranial process.Appreciated . Neurology input, metabolic encephalopathy was suggested.  MRI of brain showed no acute infarct, chronic microvascular  ischemic changes. Some better overall, more alert  * Acute on chronic diastolic CHF with acute hypoxic respiratory failure - off  Nonrebreather, on nasal cannulas, at 2 L. Chest x-ray showed improved pulmonary edema.  Continue dialysis  * New onset Atrial fibrillation On Lopressor and amiodarone  * MSSA bacteremia Blood cultures drawn at the dialysis center on 12/02/2015. cultures repeated in the hospital on 12/05/2015 Have MSSA. [name] recommends Ancef with hemodialysis for 6 weeks since negative blood cultures 12/08/2015 Until 01/19/2016 MRI of the lumbar spine revealed small fluid collection, unlikely abscess per surgery.  PermCath removed  * Hyperkalemia with end-stage renal disease. Resolved  * Hypotension,.Resolved, off medications  Holding labetalol and cardizem. Vasotec held due to hyperkalemia on admission. Stable blood pressure readings at present and restart blood pressure medications as tolerated.  * Severe depression on Remeron.   * Chronic low back pain. MRI of lumbar spine did not show any acute abnormalities except for a small cyst in paraspinal area, unlikely abscess per surgery, no intervention was recommended. Due to encephalopathy opiates are placed on hold  * Diabetes mellitus Sliding scale due to unpredictable oral intake.  * DVT prophylaxis. Patient is on heparin subcutaneously.  *Hyponatremia, likely due to fluid overload  * Patient is incompetent to make decisions for medical care Brother is healthcare power of attorney. Mother refused.  All the records are reviewed and case discussed with Care Management/Social Workerr. Management plans discussed with the patient, mother and they are in agreement.  CODE STATUS: full code.  TOTAL CC TIME TAKING CARE OF THIS PATIENT: 35 minutes.   POSSIBLE D/C TOMORROW DEPENDING ON CLINICAL CONDITION.  Hillary Bow R M.D on 12/14/2015 at 4:24 PM  Between 7am to 6pm - Pager - [phone]  After 6pm  go to [email or web address] - password [email or web address] [facility]  Office  [phone]  CC: Primary care physician; Elyse Jarvis, MD

## 2015-12-14 NOTE — Evaluation (Addendum)
Physical Therapy Evaluation Patient Details Name: Noah Lewis MRN: [id number] DOB: Apr 06, 1951 Today's Date: 12/14/2015   History of Present Illness  Pt is a 65 y.o. male presenting with back pain and refusing dialysis meds.  Pt admitted with new onset a-fib and hyperkalemia.  Pt s/p removal of permcath by [facility] 12/11/15 d/t infection concerns.  CT and MRI of head negative.  MRI of L-spine showed 8x10 mm fluid collection left of L5 spinous process.  PMH includes htn, DM, CA, COPD, CAD, ESRD on HD, chronic LBP.  Clinical Impression  Prior to admission, pt reports being independent ambulating with SPC.  Pt lives at a group home.  Currently pt is SBA supine to sit (increased time and effort with HOB elevated) and min assist to transfer to chair; deferred ambulation d/t SOB with activity.  Pt declined use of RW for transfer and required hand placement on bed rail and chair in order to complete transfer.  Pt would benefit from skilled PT to address noted impairments and functional limitations.  Recommend pt discharge to STR when medically appropriate.     Follow Up Recommendations SNF    Equipment Recommendations   (pt refusing use of RW (wants to use his SPC instead))    Recommendations for Other Services       Precautions / Restrictions Precautions Precautions: Fall Precaution Comments: L UE AV fistula Restrictions Weight Bearing Restrictions: No      Mobility  Bed Mobility Overal bed mobility: Needs Assistance Bed Mobility: Supine to Sit     Supine to sit: Supervision;HOB elevated     General bed mobility comments: increased effort and time to perform on own  Transfers Overall transfer level: Needs assistance Equipment used: None Transfers: Sit to/from [facility] Sit to Stand: Min assist Stand pivot transfers: Min assist       General transfer comment: pt refused use of RW; 2 attempts to stand (pt unsteady first time standing and sat back down); pt  required use of bed rail and chair for hand hold during transfer to steady  Ambulation/Gait             General Gait Details: Deferred d/t SOB with transfer to chair  Stairs            Wheelchair Mobility    Modified Rankin (Stroke Patients Only)       Balance Overall balance assessment: Needs assistance Sitting-balance support: Bilateral upper extremity supported;Feet supported Sitting balance-Leahy Scale: Good     Standing balance support: Single extremity supported (on bedrail) Standing balance-Leahy Scale: Fair                               Pertinent Vitals/Pain Pain Assessment: 0-10 Pain Score: 3  Pain Location: L proximal UE Pain Descriptors / Indicators: Sore Pain Intervention(s): Limited activity within patient's tolerance;Monitored during session;Repositioned  Vitals stable and WFL throughout treatment session on 2 L/min via nasal cannula.    Home Living Family/patient expects to be discharged to:: Group home                      Prior Function Level of Independence: Independent with assistive device(s)         Comments: Pt uses cane at baseline.  Pt uses supplemental O2 during the day and night baseline.     Hand Dominance        Extremity/Trunk Assessment   Upper Extremity Assessment:  Generalized weakness           Lower Extremity Assessment: Generalized weakness         Communication   Communication: HOH  Cognition Arousal/Alertness: Awake/alert Behavior During Therapy: WFL for tasks assessed/performed Overall Cognitive Status: No family/caregiver present to determine baseline cognitive functioning (Pt oriented to person and place)                      General Comments   Nursing cleared pt for participation in physical therapy.  Pt agreeable to PT session.   Exercises        Assessment/Plan    PT Assessment Patient needs continued PT services  PT Diagnosis Difficulty  walking;Generalized weakness   PT Problem List Decreased strength;Decreased activity tolerance;Decreased balance;Decreased mobility;Decreased safety awareness  PT Treatment Interventions DME instruction;Gait training;Functional mobility training;Therapeutic activities;Therapeutic exercise;Balance training;Patient/family education   PT Goals (Current goals can be found in the Care Plan section) Acute Rehab PT Goals Patient Stated Goal: to be able to walk with [facility] again PT Goal Formulation: With patient Time For Goal Achievement: 12/28/15 Potential to Achieve Goals: Fair    Frequency Min 2X/week   Barriers to discharge Decreased caregiver support      Co-evaluation               End of Session Equipment Utilized During Treatment: Gait belt;Oxygen (2 L/min via nasal cannula) Activity Tolerance: Patient limited by fatigue Patient left: in chair;with call bell/phone within reach;with chair alarm set Nurse Communication: Mobility status;Precautions         Time: 1050-1120 PT Time Calculation (min) (ACUTE ONLY): 30 min   Charges:   PT Evaluation $PT Eval Low Complexity: 1 Procedure     PT G CodesLeitha Bleak 01/03/2016, 12:22 PM Leitha Bleak, PT [phone]  Addendum:  Pt required verbal and written instructions during session d/t HOH.  Pt instructed to not get up on his own and to call nursing first and wait for them to come prior to attempting to get up (these instructions written down for pt and pt verbalized understanding). Leitha Bleak, [place]

## 2015-12-14 NOTE — Progress Notes (Signed)
Clinical Social Worker was informed that patient will be medically ready to discharge to [facility]. Patient and his mother are in a agreement with plan. CSW called Magda Paganini in admissions at [facility] to confirm that patient's bed is ready. Provided patient's room number 303 B and number to call for report [phone] . All discharge information faxed to [facility] via [place].    Call to patient's Mother- [name] left message to inform her patient would discharge to [facility]. RN will call report and patient will discharge to [facility] via [facility].  Ernest Pine, MSW, [place] Social Work Department [phone]

## 2015-12-14 NOTE — Progress Notes (Addendum)
CSW left voicemail for Ebony Hail in admissions at [facility] to inform her that patient may be stable for discharge today. CSW spoke to [facility]- Dialysis Liaison she reports that she has not confirmed patient's chair time at this moment but she'll work on it today.   CSW inquired if patient has been sitting up in the chair for his dialysis treatments. Informed that he has not been sitting up in the chair for treatments. CSW requested PT evaluation to determine if patient is able to sit up. Per Nurse Tech patient is able to sit up on his own and typically eats his meals sitting up on the side of the bed. It's also noted that patient is able to get up to bedside commode with minimal assist. PT stated that patient is able to sit in a chair for dialysis.  CSW is still awaiting new dialysis chair time for Dialysis coordinator- Iran Sizer.  CSW will continue to follow and assist.   Ernest Pine, MSW, [place] Work Department [phone]

## 2015-12-14 NOTE — Clinical Social Work Placement (Signed)
   CLINICAL SOCIAL WORK PLACEMENT  NOTE  Date:  12/14/2015  Patient Details  Name: Noah Lewis MRN: [id number] Date of Birth: [birth year]  Clinical Social Work is seeking post-discharge placement for this patient at the [place] level of care (*CSW will initial, date and re-position this form in  chart as items are completed):  Yes   Patient/family provided with [place] Work Department's list of facilities offering this level of care within the geographic area requested by the patient (or if unable, by the patient's family).  Yes   Patient/family informed of their freedom to choose among providers that offer the needed level of care, that participate in Medicare, Medicaid or managed care program needed by the patient, have an available bed and are willing to accept the patient.  Yes   Patient/family informed of 's ownership interest in [facility] and [facility], as well as of the fact that they are under no obligation to receive care at these facilities.  PASRR submitted to EDS on       PASRR number received on       Existing PASRR number confirmed on 12/14/15     FL2 transmitted to all facilities in geographic area requested by pt/family on 12/14/15     FL2 transmitted to all facilities within larger geographic area on 12/14/15     Patient informed that his/her managed care company has contracts with or will negotiate with certain facilities, including the following:        Yes   Patient/family informed of bed offers received.  Patient chooses bed at  [facility])     Physician recommends and patient chooses bed at      Patient to be transferred to  [facility]) on 12/14/15.  Patient to be transferred to facility by  [facility])     Patient family notified on 12/14/15 of transfer.  Name of family member notified:   (Mother- [name] )     PHYSICIAN       Additional Comment:     _______________________________________________ Baldemar Lenis, LCSW 12/14/2015, 12:09 PM

## 2015-12-14 NOTE — Progress Notes (Signed)
Subjective:  Patient had hemodialysis yesterday. He is resting comfortably in bed. No significant change in mental status. He is arousable but not very interactive.   Objective:  Vital signs in last 24 hours:  Temp:  [97.7 F (36.5 C)-98.1 F (36.7 C)] 98.1 F (36.7 C) (03/23 0411) Pulse Rate:  [70-98] 70 (03/23 0411) Resp:  [13-29] 18 (03/23 0411) BP: (118-160)/(70-106) 127/78 mmHg (03/23 0411) SpO2:  [85 %-99 %] 99 % (03/23 0411) Weight:  [72.938 kg (160 lb 12.8 oz)-76.3 kg (168 lb 3.4 oz)] 72.938 kg (160 lb 12.8 oz) (03/23 0451)  Weight change: 1.184 kg (2 lb 9.8 oz) Filed Weights   12/13/15 0918 12/13/15 1340 12/14/15 0451  Weight: 76.3 kg (168 lb 3.4 oz) 73.9 kg (162 lb 14.7 oz) 72.938 kg (160 lb 12.8 oz)    Intake/Output:    Intake/Output Summary (Last 24 hours) at 12/14/15 0849 Last data filed at 12/13/15 1630  Gross per 24 hour  Intake      0 ml  Output   1500 ml  Net  -1500 ml     Physical Exam: General: Chronically ill-appearing, laying in the bed   HEENT Moist oral mucous membranes   Neck Supple  Pulm/lungs Normal respiratory effort,  Scattered rhonchi  CVS/Heart Irregular rhythm   Abdomen:  Soft, nontender, BS present  Extremities:  + Dependent edema   Neurologic:  lethargic, arousable, will follow a few simple commands   Skin: No acute rashes   Access: Left arm AV fistula       Basic Metabolic Panel:   Recent Labs Lab 12/10/15 0702 12/11/15 0726 12/12/15 0729  NA 130* 129*  --   K 4.6  --  4.1  CL 94*  --   --   CO2 21*  --   --   GLUCOSE 84  --   --   BUN 66*  --   --   CREATININE 9.71*  --   --   CALCIUM 7.5*  --   --   MG 2.6*  --  2.3     CBC:  Recent Labs Lab 12/08/15 0252 12/09/15 0627 12/10/15 0702 12/11/15 0726 12/14/15 0649  WBC 13.6* 12.6* 14.6* 12.3* 11.9*  HGB 9.6* 9.8* 9.7* 8.8* 8.5*  HCT 29.0* 29.8* 29.2* 26.6* 25.9*  MCV 84.5 85.4 86.0 85.1 86.0  PLT 181 193 199 234 297      Microbiology:  Recent  Results (from the past 720 hour(s))  MRSA PCR Screening     Status: None   Collection Time: 12/04/15 10:40 PM  Result Value Ref Range Status   MRSA by PCR NEGATIVE NEGATIVE Final    Comment:        The GeneXpert MRSA Assay (FDA approved for NASAL specimens only), is one component of a comprehensive MRSA colonization surveillance program. It is not intended to diagnose MRSA infection nor to guide or monitor treatment for MRSA infections.   CULTURE, BLOOD (ROUTINE X 2) w Reflex to PCR ID Panel     Status: None   Collection Time: 12/05/15 10:00 PM  Result Value Ref Range Status   Specimen Description BLOOD RIGHT ANTECUBITAL  Final   Special Requests BOTTLES DRAWN AEROBIC AND ANAEROBIC 5ML  Final   Culture  Setup Time   Final    GRAM POSITIVE COCCI ANAEROBIC BOTTLE ONLY CRITICAL RESULT CALLED TO, READ BACK BY AND VERIFIED WITH: MELISSA Kennedy 12/07/15 1458    Culture STAPHYLOCOCCUS AUREUS ANAEROBIC BOTTLE ONLY   Final  Report Status 12/11/2015 FINAL  Final   Organism ID, Bacteria STAPHYLOCOCCUS AUREUS  Final      Susceptibility   Staphylococcus aureus - MIC*    CIPROFLOXACIN <=0.5 SENSITIVE Sensitive     ERYTHROMYCIN 4 RESISTANT Resistant     GENTAMICIN <=0.5 SENSITIVE Sensitive     OXACILLIN <=0.25 SENSITIVE Sensitive     TETRACYCLINE <=1 SENSITIVE Sensitive     VANCOMYCIN 1 SENSITIVE Sensitive     TRIMETH/SULFA <=10 SENSITIVE Sensitive     CLINDAMYCIN RESISTANT Resistant     RIFAMPIN <=0.5 SENSITIVE Sensitive     Inducible Clindamycin Value in next row Resistant      POSITIVEINDUCIBLE CLINDAMYCIN RESISTANCE - A positive ICR test is indicative of inducible resistance to macrolides, lincosamides, and type B streptogramin.  This isolate is presumed to be resistant to Clindamycin, however, Clindamycin may still be effective in some patients.     * STAPHYLOCOCCUS AUREUS  Blood Culture ID Panel (Reflexed)     Status: Abnormal   Collection Time: 12/05/15 10:00 PM  Result Value  Ref Range Status   Enterococcus species NOT DETECTED NOT DETECTED Final   Vancomycin resistance NOT DETECTED NOT DETECTED Final   Listeria monocytogenes NOT DETECTED NOT DETECTED Final   Staphylococcus species DETECTED (A) NOT DETECTED Final    Comment: CRITICAL RESULT CALLED TO, READ BACK BY AND VERIFIED WITH: MELISSA MECCIA 12/07/15 1458 MLM    Staphylococcus aureus DETECTED (A) NOT DETECTED Final    Comment: CRITICAL RESULT CALLED TO, READ BACK BY AND VERIFIED WITH: MELISSA MECCIA 12/07/15 1458 MLM    Methicillin resistance NOT DETECTED NOT DETECTED Final   Streptococcus species NOT DETECTED NOT DETECTED Final   Streptococcus agalactiae NOT DETECTED NOT DETECTED Final   Streptococcus pneumoniae NOT DETECTED NOT DETECTED Final   Streptococcus pyogenes NOT DETECTED NOT DETECTED Final   Acinetobacter baumannii NOT DETECTED NOT DETECTED Final   Enterobacteriaceae species NOT DETECTED NOT DETECTED Final   Enterobacter cloacae complex NOT DETECTED NOT DETECTED Final   Escherichia coli NOT DETECTED NOT DETECTED Final   Klebsiella oxytoca NOT DETECTED NOT DETECTED Final   Klebsiella pneumoniae NOT DETECTED NOT DETECTED Final   Proteus species NOT DETECTED NOT DETECTED Final   Serratia marcescens NOT DETECTED NOT DETECTED Final   Carbapenem resistance NOT DETECTED NOT DETECTED Final   Haemophilus influenzae NOT DETECTED NOT DETECTED Final   Neisseria meningitidis NOT DETECTED NOT DETECTED Final   Pseudomonas aeruginosa NOT DETECTED NOT DETECTED Final   Candida albicans NOT DETECTED NOT DETECTED Final   Candida glabrata NOT DETECTED NOT DETECTED Final   Candida krusei NOT DETECTED NOT DETECTED Final   Candida parapsilosis NOT DETECTED NOT DETECTED Final   Candida tropicalis NOT DETECTED NOT DETECTED Final  CULTURE, BLOOD (ROUTINE X 2) w Reflex to PCR ID Panel     Status: None   Collection Time: 12/05/15 10:05 PM  Result Value Ref Range Status   Specimen Description BLOOD BLOOD RIGHT  FOREARM  Final   Special Requests BOTTLES DRAWN AEROBIC AND ANAEROBIC 5ML  Final   Culture NO GROWTH 5 DAYS  Final   Report Status 12/10/2015 FINAL  Final  CULTURE, BLOOD (ROUTINE X 2) w Reflex to PCR ID Panel     Status: None   Collection Time: 12/08/15  2:52 PM  Result Value Ref Range Status   Specimen Description BLOOD RIGHT HAND  Final   Special Requests   Final    BOTTLES DRAWN AEROBIC AND ANAEROBIC 8CC AERO 6CC ANA  Culture NO GROWTH 5 DAYS  Final   Report Status 12/13/2015 FINAL  Final  CULTURE, BLOOD (ROUTINE X 2) w Reflex to PCR ID Panel     Status: None   Collection Time: 12/08/15  6:19 PM  Result Value Ref Range Status   Specimen Description BLOOD RIGHT WRIST  Final   Special Requests   Final    BOTTLES DRAWN AEROBIC AND ANAEROBIC [place]   Culture NO GROWTH 5 DAYS  Final   Report Status 12/13/2015 FINAL  Final    Coagulation Studies: No results for input(s): LABPROT, INR in the last 72 hours.  Urinalysis: No results for input(s): COLORURINE, LABSPEC, PHURINE, GLUCOSEU, HGBUR, BILIRUBINUR, KETONESUR, PROTEINUR, UROBILINOGEN, NITRITE, LEUKOCYTESUR in the last 72 hours.  Invalid input(s): APPERANCEUR    Imaging: No results found.   Medications:     . amiodarone  400 mg Oral BID  . aspirin EC  325 mg Oral Daily  .  ceFAZolin (ANCEF) IV  2 g Intravenous Q M,W,F-2000  . chlorhexidine  15 mL Mouth Rinse BID  . epoetin (EPOGEN/PROCRIT) injection  4,000 Units Intravenous Q M,W,F-HD  . heparin subcutaneous  5,000 Units Subcutaneous 3 times per day  . insulin aspart  0-5 Units Subcutaneous QHS  . insulin aspart  0-9 Units Subcutaneous TID WC  . latanoprost  1 drop Both Eyes QHS  . metoprolol tartrate  25 mg Oral BID  . mirtazapine  15 mg Oral QHS  . sevelamer carbonate  2,400 mg Oral TID WC  . sodium chloride flush  3 mL Intravenous Q12H   sodium chloride, acetaminophen **OR** acetaminophen, ipratropium-albuterol, labetalol, lidocaine-prilocaine,  ondansetron **OR** ondansetron (ZOFRAN) IV, polyethylene glycol  Assessment/ Plan:  65 y.o. male with end-stage renal disease, diabetes, hypertension Noah Lewis  1. End-stage renal disease with hyperkalemia - Patient had hemodialysis yesterday. No acute indication for dialysis today. We will plan for dialysis again tomorrow.  2. Atrial fibrillation - Continue metoprolol for rate control as well as amiodarone.  3. Anemia of chronic kidney disease - Hemoglobin currently 8.5. Continue Epogen with dialysis.  4. SHPTH - Continue Renvela 2400 mg by mouth 3 times a day with meals or phosphorous control.  Periodically monitor bone mineral metabolism parameters.  5.  Altered mental status:  Mental status appears to be stable at the moment. He is arousable and will follow a few simple commands but is not very conversant at all. Psychiatry and neurology have evaluated him.    LOS: [place], Kasten Leveque 3/23/20178:49 AM

## 2015-12-14 NOTE — Progress Notes (Signed)
SUBJECTIVE: Patient is less short of breath today   Filed Vitals:   12/13/15 1340 12/13/15 2035 12/14/15 0411 12/14/15 0451  BP:  118/70 127/78   Pulse:  86 70   Temp:  97.7 F (36.5 C) 98.1 F (36.7 C)   TempSrc:  Oral Oral   Resp:  19 18   Height: 5\' 9"  (1.753 m)     Weight: 162 lb 14.7 oz (73.9 kg)   160 lb 12.8 oz (72.938 kg)  SpO2:  93% 99%     Intake/Output Summary (Last 24 hours) at 12/14/15 0847 Last data filed at 12/13/15 1630  Gross per 24 hour  Intake      0 ml  Output   1500 ml  Net  -1500 ml    LABS: Basic Metabolic Panel:  Recent Labs  12/12/15 0729  K 4.1  MG 2.3   Liver Function Tests: No results for input(s): AST, ALT, ALKPHOS, BILITOT, PROT, ALBUMIN in the last 72 hours. No results for input(s): LIPASE, AMYLASE in the last 72 hours. CBC:  Recent Labs  12/14/15 0649  WBC 11.9*  HGB 8.5*  HCT 25.9*  MCV 86.0  PLT 297   Cardiac Enzymes: No results for input(s): CKTOTAL, CKMB, CKMBINDEX, TROPONINI in the last 72 hours. BNP: Invalid input(s): POCBNP D-Dimer: No results for input(s): DDIMER in the last 72 hours. Hemoglobin A1C: No results for input(s): HGBA1C in the last 72 hours. Fasting Lipid Panel: No results for input(s): CHOL, HDL, LDLCALC, TRIG, CHOLHDL, LDLDIRECT in the last 72 hours. Thyroid Function Tests: No results for input(s): TSH, T4TOTAL, T3FREE, THYROIDAB in the last 72 hours.  Invalid input(s): FREET3 Anemia Panel: No results for input(s): VITAMINB12, FOLATE, FERRITIN, TIBC, IRON, RETICCTPCT in the last 72 hours.   PHYSICAL EXAM General: Well developed, well nourished, in no acute distress HEENT:  Normocephalic and atramatic Neck:  No JVD.  Lungs: Clear bilaterally to auscultation and percussion. Heart: HRRR . Normal S1 and S2 without gallops or murmurs.  Abdomen: Bowel sounds are positive, abdomen soft and non-tender  Msk:  Back normal, normal gait. Normal strength and tone for age. Extremities: No clubbing,  cyanosis or edema.   Neuro: Alert and oriented X 3. Psych:  Good affect, responds appropriately  TELEMETRY:Monitor shows sinus rhythm no evidence of V. tach  ASSESSMENT AND PLAN: Status post V. tach with left radical ejection fraction [id number] and diastolic dysfunction heart failure. Patient is right now feeling much better advise tapering off on amiodarone to 400 mg by mouth daily after few days. May go home with follow-up in the office.  Active Problems:   Bipolar depression ([place])   A-fib ([place])   Bacteremia   Back pain    Noah Lewis A, MD, [facility] 12/14/2015 8:47 AM

## 2015-12-14 NOTE — Progress Notes (Signed)
CSW was looking to discharge patient to [facility] today. CSW awaiting new dialysis chair time for a [place] location and dis not receive it. CSW informed RN, MD Sudini, and Ebony Hail in admissions at [facility] that patient will not discharge today due to not having a dialysis chair time. CSW will follow up on dialysis chair time tomorrow morning with Iran Sizer- Dialysis Liasion. CSW will continue to follow and assist.  Ernest Pine, MSW, [place] Work Department [phone]

## 2015-12-15 LAB — CBC WITH DIFFERENTIAL/PLATELET
BASOS ABS: 0.1 10*3/uL (ref 0–0.1)
BASOS PCT: 1 %
EOS PCT: 2 %
Eosinophils Absolute: 0.3 10*3/uL (ref 0–0.7)
HCT: 26 % — ABNORMAL LOW (ref 40.0–52.0)
Hemoglobin: 8.5 g/dL — ABNORMAL LOW (ref 13.0–18.0)
Lymphocytes Relative: 7 %
Lymphs Abs: 0.9 10*3/uL — ABNORMAL LOW (ref 1.0–3.6)
MCH: 27.5 pg (ref 26.0–34.0)
MCHC: 32.6 g/dL (ref 32.0–36.0)
MCV: 84.4 fL (ref 80.0–100.0)
MONO ABS: 1.2 10*3/uL — AB (ref 0.2–1.0)
Monocytes Relative: 10 %
Neutro Abs: 9.9 10*3/uL — ABNORMAL HIGH (ref 1.4–6.5)
Neutrophils Relative %: 80 %
PLATELETS: 364 10*3/uL (ref 150–440)
RBC: 3.08 MIL/uL — ABNORMAL LOW (ref 4.40–5.90)
RDW: 17.4 % — AB (ref 11.5–14.5)
WBC: 12.3 10*3/uL — ABNORMAL HIGH (ref 3.8–10.6)

## 2015-12-15 LAB — RENAL FUNCTION PANEL
ANION GAP: 13 (ref 5–15)
Albumin: 2.5 g/dL — ABNORMAL LOW (ref 3.5–5.0)
BUN: 87 mg/dL — AB (ref 6–20)
CHLORIDE: 96 mmol/L — AB (ref 101–111)
CO2: 23 mmol/L (ref 22–32)
Calcium: 7.5 mg/dL — ABNORMAL LOW (ref 8.9–10.3)
Creatinine, Ser: 9.23 mg/dL — ABNORMAL HIGH (ref 0.61–1.24)
GFR calc Af Amer: 6 mL/min — ABNORMAL LOW (ref >60)
GFR, EST NON AFRICAN AMERICAN: 5 mL/min — AB (ref >60)
Glucose, Bld: 105 mg/dL — ABNORMAL HIGH (ref 65–99)
POTASSIUM: 5.7 mmol/L — AB (ref 3.5–5.1)
Phosphorus: 6.7 mg/dL — ABNORMAL HIGH (ref 2.5–4.6)
Sodium: 132 mmol/L — ABNORMAL LOW (ref 135–145)

## 2015-12-15 LAB — GLUCOSE, CAPILLARY
GLUCOSE-CAPILLARY: 151 mg/dL — AB (ref 65–99)
Glucose-Capillary: 93 mg/dL (ref 65–99)
Glucose-Capillary: 78 mg/dL (ref 65–99)

## 2015-12-15 NOTE — Progress Notes (Addendum)
CSW was informed by Dialysis Liasion- Iran Sizer that she was not able to get patient a dialysis chair time in [place] due to patient's "mental status". Reports that patient can return to his original dialysis center ([facility] MWF @2 :30 PM). Contacted [facility] and [facility] to see if bed offer still stands. Magda Paganini with [facility] requested CSW to resend referral. CSW sent it via [place]. Per Helene Kelp at [facility] she's able to accept patient today. CSW called patient's mother [name] and explained above. Presented bed offers in [place]. , [place]. She requested [facility]. CSW is awaiting [facility] response to referral.   [facility] has extended bed offer. Reported they'll call CSW to give room and report. CSW will continue to follow and assist.   Ernest Pine, MSW, [place] Work Department [phone]

## 2015-12-15 NOTE — Progress Notes (Signed)
Discharged via [facility].  Don at [facility] made aware of pending arrival.

## 2015-12-15 NOTE — Progress Notes (Signed)
Subjective:  Patient is slightly more interactive today. He is due for dialysis today. It appears that he will be placed in a nursing home here in [facility].   Objective:  Vital signs in last 24 hours:  Temp:  [97.6 F (36.4 C)-98.3 F (36.8 C)] 98.3 F (36.8 C) (03/24 1610) Pulse Rate:  [69-91] 69 (03/24 1113) Resp:  [16-20] 20 (03/24 1113) BP: (119-142)/(79-83) 119/83 mmHg (03/24 1113) SpO2:  [97 %-98 %] 97 % (03/24 1113) Weight:  [77.202 kg (170 lb 3.2 oz)] 77.202 kg (170 lb 3.2 oz) (03/24 1610)  Weight change:  Filed Weights   12/13/15 1340 12/14/15 0451 12/15/15 1610  Weight: 73.9 kg (162 lb 14.7 oz) 72.938 kg (160 lb 12.8 oz) 77.202 kg (170 lb 3.2 oz)    Intake/Output:    Intake/Output Summary (Last 24 hours) at 12/15/15 1635 Last data filed at 12/15/15 1130  Gross per 24 hour  Intake    600 ml  Output      0 ml  Net    600 ml     Physical Exam: General: Chronically ill-appearing, laying in the bed   HEENT Moist oral mucous membranes   Neck Supple  Pulm/lungs Normal respiratory effort,  Scattered rhonchi  CVS/Heart Irregular rhythm   Abdomen:  Soft, nontender, BS present  Extremities:  + Dependent edema   Neurologic:  lethargic, arousable, will follow a few simple commands   Skin: No acute rashes   Access: Left arm AV fistula       Basic Metabolic Panel:   Recent Labs Lab 12/10/15 0702 12/11/15 0726 12/12/15 0729  NA 130* 129*  --   K 4.6  --  4.1  CL 94*  --   --   CO2 21*  --   --   GLUCOSE 84  --   --   BUN 66*  --   --   CREATININE 9.71*  --   --   CALCIUM 7.5*  --   --   MG 2.6*  --  2.3     CBC:  Recent Labs Lab 12/09/15 0627 12/10/15 0702 12/11/15 0726 12/14/15 0649 12/15/15 1620  WBC 12.6* 14.6* 12.3* 11.9* 12.3*  NEUTROABS  --   --   --   --  9.9*  HGB 9.8* 9.7* 8.8* 8.5* 8.5*  HCT 29.8* 29.2* 26.6* 25.9* 26.0*  MCV 85.4 86.0 85.1 86.0 84.4  PLT 193 199 234 297 364      Microbiology:  Recent Results  (from the past 720 hour(s))  MRSA PCR Screening     Status: None   Collection Time: 12/04/15 10:40 PM  Result Value Ref Range Status   MRSA by PCR NEGATIVE NEGATIVE Final    Comment:        The GeneXpert MRSA Assay (FDA approved for NASAL specimens only), is one component of a comprehensive MRSA colonization surveillance program. It is not intended to diagnose MRSA infection nor to guide or monitor treatment for MRSA infections.   CULTURE, BLOOD (ROUTINE X 2) w Reflex to PCR ID Panel     Status: None   Collection Time: 12/05/15 10:00 PM  Result Value Ref Range Status   Specimen Description BLOOD RIGHT ANTECUBITAL  Final   Special Requests BOTTLES DRAWN AEROBIC AND ANAEROBIC 5ML  Final   Culture  Setup Time   Final    GRAM POSITIVE COCCI ANAEROBIC BOTTLE ONLY CRITICAL RESULT CALLED TO, READ BACK BY AND VERIFIED WITH: MELISSA MACCIA 12/07/15 1458  Culture STAPHYLOCOCCUS AUREUS ANAEROBIC BOTTLE ONLY   Final   Report Status 12/11/2015 FINAL  Final   Organism ID, Bacteria STAPHYLOCOCCUS AUREUS  Final      Susceptibility   Staphylococcus aureus - MIC*    CIPROFLOXACIN <=0.5 SENSITIVE Sensitive     ERYTHROMYCIN 4 RESISTANT Resistant     GENTAMICIN <=0.5 SENSITIVE Sensitive     OXACILLIN <=0.25 SENSITIVE Sensitive     TETRACYCLINE <=1 SENSITIVE Sensitive     VANCOMYCIN 1 SENSITIVE Sensitive     TRIMETH/SULFA <=10 SENSITIVE Sensitive     CLINDAMYCIN RESISTANT Resistant     RIFAMPIN <=0.5 SENSITIVE Sensitive     Inducible Clindamycin Value in next row Resistant      POSITIVEINDUCIBLE CLINDAMYCIN RESISTANCE - A positive ICR test is indicative of inducible resistance to macrolides, lincosamides, and type B streptogramin.  This isolate is presumed to be resistant to Clindamycin, however, Clindamycin may still be effective in some patients.     * STAPHYLOCOCCUS AUREUS  Blood Culture ID Panel (Reflexed)     Status: Abnormal   Collection Time: 12/05/15 10:00 PM  Result Value Ref Range  Status   Enterococcus species NOT DETECTED NOT DETECTED Final   Vancomycin resistance NOT DETECTED NOT DETECTED Final   Listeria monocytogenes NOT DETECTED NOT DETECTED Final   Staphylococcus species DETECTED (A) NOT DETECTED Final    Comment: CRITICAL RESULT CALLED TO, READ BACK BY AND VERIFIED WITH: MELISSA MECCIA 12/07/15 1458 MLM    Staphylococcus aureus DETECTED (A) NOT DETECTED Final    Comment: CRITICAL RESULT CALLED TO, READ BACK BY AND VERIFIED WITH: MELISSA MECCIA 12/07/15 1458 MLM    Methicillin resistance NOT DETECTED NOT DETECTED Final   Streptococcus species NOT DETECTED NOT DETECTED Final   Streptococcus agalactiae NOT DETECTED NOT DETECTED Final   Streptococcus pneumoniae NOT DETECTED NOT DETECTED Final   Streptococcus pyogenes NOT DETECTED NOT DETECTED Final   Acinetobacter baumannii NOT DETECTED NOT DETECTED Final   Enterobacteriaceae species NOT DETECTED NOT DETECTED Final   Enterobacter cloacae complex NOT DETECTED NOT DETECTED Final   Escherichia coli NOT DETECTED NOT DETECTED Final   Klebsiella oxytoca NOT DETECTED NOT DETECTED Final   Klebsiella pneumoniae NOT DETECTED NOT DETECTED Final   Proteus species NOT DETECTED NOT DETECTED Final   Serratia marcescens NOT DETECTED NOT DETECTED Final   Carbapenem resistance NOT DETECTED NOT DETECTED Final   Haemophilus influenzae NOT DETECTED NOT DETECTED Final   Neisseria meningitidis NOT DETECTED NOT DETECTED Final   Pseudomonas aeruginosa NOT DETECTED NOT DETECTED Final   Candida albicans NOT DETECTED NOT DETECTED Final   Candida glabrata NOT DETECTED NOT DETECTED Final   Candida krusei NOT DETECTED NOT DETECTED Final   Candida parapsilosis NOT DETECTED NOT DETECTED Final   Candida tropicalis NOT DETECTED NOT DETECTED Final  CULTURE, BLOOD (ROUTINE X 2) w Reflex to PCR ID Panel     Status: None   Collection Time: 12/05/15 10:05 PM  Result Value Ref Range Status   Specimen Description BLOOD BLOOD RIGHT FOREARM  Final    Special Requests BOTTLES DRAWN AEROBIC AND ANAEROBIC 5ML  Final   Culture NO GROWTH 5 DAYS  Final   Report Status 12/10/2015 FINAL  Final  CULTURE, BLOOD (ROUTINE X 2) w Reflex to PCR ID Panel     Status: None   Collection Time: 12/08/15  2:52 PM  Result Value Ref Range Status   Specimen Description BLOOD RIGHT HAND  Final   Special Requests   Final  BOTTLES DRAWN AEROBIC AND ANAEROBIC 8CC AERO 6CC ANA   Culture NO GROWTH 5 DAYS  Final   Report Status 12/13/2015 FINAL  Final  CULTURE, BLOOD (ROUTINE X 2) w Reflex to PCR ID Panel     Status: None   Collection Time: 12/08/15  6:19 PM  Result Value Ref Range Status   Specimen Description BLOOD RIGHT WRIST  Final   Special Requests   Final    BOTTLES DRAWN AEROBIC AND ANAEROBIC [place]   Culture NO GROWTH 5 DAYS  Final   Report Status 12/13/2015 FINAL  Final    Coagulation Studies: No results for input(s): LABPROT, INR in the last 72 hours.  Urinalysis: No results for input(s): COLORURINE, LABSPEC, PHURINE, GLUCOSEU, HGBUR, BILIRUBINUR, KETONESUR, PROTEINUR, UROBILINOGEN, NITRITE, LEUKOCYTESUR in the last 72 hours.  Invalid input(s): APPERANCEUR    Imaging: US Venous Img Upper Uni Left  12/14/2015  CLINICAL DATA:  Left arm swelling EXAM: LEFT UPPER EXTREMITY VENOUS DUPLEX ULTRASOUND TECHNIQUE: Doppler venous assessment of the left upper extremity deep venous system was performed, including characterization of spectral flow, compressibility, and phasicity. COMPARISON:  None. FINDINGS: The left jugular, axillary, and subclavian veins are compressible and patent. There is nonocclusive thrombus within the brachial vein adjacent to the shoulder that is partially compressible. The mid and distal brachial vein are compressible and patent. Radial and ulnar veins are patent. Doppler analysis demonstrates a phasic venous waveform. 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 Electronically Signed   By: Marybelle Killings M.D.   On: 12/14/2015 15:53     Medications:     . amiodarone  400 mg Oral BID  . apixaban  10 mg Oral BID   Followed by  . [START ON 12/21/2015] apixaban  5 mg Oral BID  . aspirin EC  325 mg Oral Daily  .  ceFAZolin (ANCEF) IV  2 g Intravenous Q M,W,F-2000  . chlorhexidine  15 mL Mouth Rinse BID  . epoetin (EPOGEN/PROCRIT) injection  4,000 Units Intravenous Q M,W,F-HD  . insulin aspart  0-5 Units Subcutaneous QHS  . insulin aspart  0-9 Units Subcutaneous TID WC  . latanoprost  1 drop Both Eyes QHS  . metoprolol tartrate  25 mg Oral BID  . mirtazapine  15 mg Oral QHS  . sevelamer carbonate  2,400 mg Oral TID WC  . sodium chloride flush  3 mL Intravenous Q12H   sodium chloride, acetaminophen **OR** acetaminophen, ipratropium-albuterol, labetalol, lidocaine-prilocaine, ondansetron **OR** ondansetron (ZOFRAN) IV, polyethylene glycol  Assessment/ Plan:  65 y.o. male with end-stage renal disease, diabetes, hypertension [facility]/M-W-F/ [facility]  1. End-stage renal disease with hyperkalemia - patient due for hemodialysis today.  Orders have been prepared.  2. Atrial fibrillation - heart rate 97.  Continue amiodarone, anticoagulation, and metoprolol.  3. Anemia of chronic kidney disease - Hemoglobin currently 8.5. Continue Epogen with dialysis.  4. SHPTH - patient to be continued on Renvela 2400 mg by mouth 3 times a day with meals.  5.  Altered mental status:  Patient has had extensive workup for this.  It is felt that metabolic encephalopathy is playing the largest role. He's had CT scan of the head and metabolic evaluation including ammonia, B12, and TSH levels.  Neurology consultation was also obtained.  Question if this now represents his new baseline.    LOS: [place] 3/24/20174:35 PM

## 2015-12-15 NOTE — Care Management Note (Signed)
I have been working on placement for patient since Tuesday 12/12/15.  Patient was active at [facility] prior to admission and a request was placed for a change in outpatient dialysis centers.  New request was for [facility] based on the location from the SNF he is planning to discharge to when the time occurred.  Referral made in [employer] on 12/13/15 for placement.  All current medical records where sent at that time.  [facility] required that they have a copy of patients [id number] from [place] before clinic would accept patient.  These records where not sent from [facility] until this morning causing our 1st delay in discharge.  Now this afternoon I am getting kick back from the new clinic in regards to patients current ability to be able to treat in their center due to his current medical and mental issues.  I have sent updated records through [employer] for Clinic Manager and Medical Director to review and I am in contact with Dr Holley Raring to assist with conversations with doctors in regards to this change in treating centers.  Iran Sizer  Dialysis Liaison  [phone]

## 2015-12-15 NOTE — Progress Notes (Signed)
Pt to be discharged to [facility] this evening. He is currently at dialysis. Report call to facility. Pt will discharge on return after supper. Transport by ems.

## 2015-12-15 NOTE — Progress Notes (Signed)
CSW contacted Iran Sizer- Dialysis Liaison. She reports that she's still working on getting patient at dialysis chair in [place] and will let CSW know when she's confirmed one. CSW will continue to follow and assist.  Ernest Pine, MSW, [place] Work Department [phone]

## 2015-12-15 NOTE — Clinical Social Work Placement (Signed)
   CLINICAL SOCIAL WORK PLACEMENT  NOTE  Date:  12/15/2015  Patient Details  Name: Noah Lewis MRN: [id number] Date of Birth: [birth year]  Clinical Social Work is seeking post-discharge placement for this patient at the [place] level of care (*CSW will initial, date and re-position this form in  chart as items are completed):  Yes   Patient/family provided with [facility] Work Department's list of facilities offering this level of care within the geographic area requested by the patient (or if unable, by the patient's family).  Yes   Patient/family informed of their freedom to choose among providers that offer the needed level of care, that participate in Medicare, Medicaid or managed care program needed by the patient, have an available bed and are willing to accept the patient.  Yes   Patient/family informed of [facility]'s ownership interest in [facility] and [facility], as well as of the fact that they are under no obligation to receive care at these facilities.  PASRR submitted to EDS on       PASRR number received on       Existing PASRR number confirmed on 12/14/15     FL2 transmitted to all facilities in geographic area requested by pt/family on 12/14/15     FL2 transmitted to all facilities within larger geographic area on 12/14/15     Patient informed that his/her managed care company has contracts with or will negotiate with certain facilities, including the following:        Yes   Patient/family informed of bed offers received.  Patient chooses bed at  [facility])     Physician recommends and patient chooses bed at      Patient to be transferred to  [facility]) on 12/15/15.  Patient to be transferred to facility by  [facility])     Patient family notified on 12/15/15 of transfer.  Name of family member notified:   (Mother- Noah Lewis )     PHYSICIAN       Additional Comment:     _______________________________________________ Baldemar Lenis, LCSW 12/15/2015, 2:29 PM

## 2015-12-15 NOTE — Progress Notes (Signed)
Returned from hemodialysis.  Right arm swollen with dressing CDI.  Bruit/trill noted.  Charlie, Hemodialysis nurse reports arm swollen and reported to Dr Holley Raring earlier.  [place] snack tray and meds given at this time.

## 2015-12-15 NOTE — Progress Notes (Signed)
SUBJECTIVE: Patient is feeeling less short of breat   Filed Vitals:   12/14/15 0451 12/14/15 1127 12/14/15 1945 12/15/15 0800  BP:  126/80 142/81 138/79  Pulse:  84 91 90  Temp:  97.8 F (36.6 C) 98.1 F (36.7 C) 98.2 F (36.8 C)  TempSrc:  Oral Oral Oral  Resp:  19 16 18   Height:      Weight: 160 lb 12.8 oz (72.938 kg)     SpO2:  99% 98% 98%    Intake/Output Summary (Last 24 hours) at 12/15/15 1006 Last data filed at 12/15/15 0806  Gross per 24 hour  Intake    240 ml  Output      0 ml  Net    240 ml    LABS: Basic Metabolic Panel: No results for input(s): NA, K, CL, CO2, GLUCOSE, BUN, CREATININE, CALCIUM, MG, PHOS in the last 72 hours. Liver Function Tests: No results for input(s): AST, ALT, ALKPHOS, BILITOT, PROT, ALBUMIN in the last 72 hours. No results for input(s): LIPASE, AMYLASE in the last 72 hours. CBC:  Recent Labs  12/14/15 0649  WBC 11.9*  HGB 8.5*  HCT 25.9*  MCV 86.0  PLT 297   Cardiac Enzymes: No results for input(s): CKTOTAL, CKMB, CKMBINDEX, TROPONINI in the last 72 hours. BNP: Invalid input(s): POCBNP D-Dimer: No results for input(s): DDIMER in the last 72 hours. Hemoglobin A1C: No results for input(s): HGBA1C in the last 72 hours. Fasting Lipid Panel: No results for input(s): CHOL, HDL, LDLCALC, TRIG, CHOLHDL, LDLDIRECT in the last 72 hours. Thyroid Function Tests: No results for input(s): TSH, T4TOTAL, T3FREE, THYROIDAB in the last 72 hours.  Invalid input(s): FREET3 Anemia Panel: No results for input(s): VITAMINB12, FOLATE, FERRITIN, TIBC, IRON, RETICCTPCT in the last 72 hours.   PHYSICAL EXAM General: Well developed, well nourished, in no acute distress HEENT:  Normocephalic and atramatic Neck:  No JVD.  Lungs: Clear bilaterally to auscultation and percussion. Heart: HRRR . Normal S1 and S2 without gallops or murmurs.  Abdomen: Bowel sounds are positive, abdomen soft and non-tender  Msk:  Back normal, normal gait. Normal  strength and tone for age. Extremities: No clubbing, cyanosis or edema.   Neuro: Alert and oriented X 3. Psych:  Good affect, responds appropriately  TELEMETRY: NSR  ASSESSMENT AND PLAN: Patient is going to nursing home today. No further episodes of VTach, may go there on amiodrone 400 qd. Active Problems:   Bipolar depression ([place])   A-fib ([place])   Bacteremia   Back pain    Carolyne Whitsel A, MD, [facility] 12/15/2015 10:06 AM

## 2016-02-20 ENCOUNTER — Other Ambulatory Visit: Payer: Self-pay | Admitting: Vascular Surgery

## 2016-02-22 ENCOUNTER — Encounter
Admission: RE | Disposition: A | Discharge status: Skilled Nursing Facility | Payer: Self-pay | Source: Ambulatory Visit | Attending: Vascular Surgery

## 2016-02-22 ENCOUNTER — Ambulatory Visit
Admission: RE | Admit: 2016-02-22 | Discharge: 2016-02-22 | Disposition: A | Discharge status: Skilled Nursing Facility | Payer: Medicaid Other | Source: Ambulatory Visit | Attending: Vascular Surgery | Admitting: Vascular Surgery

## 2016-02-22 DIAGNOSIS — I120 Hypertensive chronic kidney disease with stage 5 chronic kidney disease or end stage renal disease: Secondary | ICD-10-CM | POA: Insufficient documentation

## 2016-02-22 DIAGNOSIS — Z8249 Family history of ischemic heart disease and other diseases of the circulatory system: Secondary | ICD-10-CM | POA: Diagnosis not present

## 2016-02-22 DIAGNOSIS — J449 Chronic obstructive pulmonary disease, unspecified: Secondary | ICD-10-CM | POA: Diagnosis not present

## 2016-02-22 DIAGNOSIS — J45909 Unspecified asthma, uncomplicated: Secondary | ICD-10-CM | POA: Insufficient documentation

## 2016-02-22 DIAGNOSIS — Z880 Allergy status to penicillin: Secondary | ICD-10-CM | POA: Diagnosis not present

## 2016-02-22 DIAGNOSIS — M1990 Unspecified osteoarthritis, unspecified site: Secondary | ICD-10-CM | POA: Diagnosis not present

## 2016-02-22 DIAGNOSIS — Z992 Dependence on renal dialysis: Secondary | ICD-10-CM | POA: Insufficient documentation

## 2016-02-22 DIAGNOSIS — Z833 Family history of diabetes mellitus: Secondary | ICD-10-CM | POA: Diagnosis not present

## 2016-02-22 DIAGNOSIS — E875 Hyperkalemia: Secondary | ICD-10-CM | POA: Insufficient documentation

## 2016-02-22 DIAGNOSIS — E1122 Type 2 diabetes mellitus with diabetic chronic kidney disease: Secondary | ICD-10-CM | POA: Diagnosis not present

## 2016-02-22 DIAGNOSIS — I2510 Atherosclerotic heart disease of native coronary artery without angina pectoris: Secondary | ICD-10-CM | POA: Insufficient documentation

## 2016-02-22 DIAGNOSIS — N186 End stage renal disease: Secondary | ICD-10-CM | POA: Insufficient documentation

## 2016-02-22 DIAGNOSIS — Y832 Surgical operation with anastomosis, bypass or graft as the cause of abnormal reaction of the patient, or of later complication, without mention of misadventure at the time of the procedure: Secondary | ICD-10-CM | POA: Insufficient documentation

## 2016-02-22 DIAGNOSIS — T82898A Other specified complication of vascular prosthetic devices, implants and grafts, initial encounter: Secondary | ICD-10-CM | POA: Diagnosis not present

## 2016-02-22 HISTORY — PX: PERIPHERAL VASCULAR CATHETERIZATION: SHX172C

## 2016-02-22 LAB — POTASSIUM (ARMC VASCULAR LAB ONLY): POTASSIUM (ARMC VASCULAR LAB): 6.5 — AB (ref 3.5–5.1)

## 2016-02-22 SURGERY — A/V SHUNTOGRAM/FISTULAGRAM
Anesthesia: Moderate Sedation | Laterality: Left

## 2016-02-22 MED ORDER — ALUM & MAG HYDROXIDE-SIMETH 200-200-20 MG/5ML PO SUSP: 15.0000 mL | ORAL | Status: DC | PRN

## 2016-02-22 MED ORDER — HEPARIN SODIUM (PORCINE) 1000 UNIT/ML IJ SOLN
INTRAMUSCULAR | Status: DC | PRN
  Administered 2016-02-22: 4000 [IU] via INTRAVENOUS

## 2016-02-22 MED ORDER — LIDOCAINE-EPINEPHRINE (PF) 1 %-1:200000 IJ SOLN
INTRAMUSCULAR | Status: AC
  Filled 2016-02-22: qty 30

## 2016-02-22 MED ORDER — LIDOCAINE-EPINEPHRINE (PF) 1 %-1:200000 IJ SOLN
INTRAMUSCULAR | Status: DC | PRN
  Administered 2016-02-22: 10 mL via INTRADERMAL

## 2016-02-22 MED ORDER — CLINDAMYCIN PHOSPHATE 300 MG/50ML IV SOLN: 300.0000 mg | Freq: Once | INTRAVENOUS | Status: DC

## 2016-02-22 MED ORDER — ONDANSETRON HCL 4 MG/2ML IJ SOLN: 4.0000 mg | Freq: Four times a day (QID) | INTRAMUSCULAR | Status: DC | PRN

## 2016-02-22 MED ORDER — SODIUM CHLORIDE 0.9 % IV SOLN: 500.0000 mL | Freq: Once | INTRAVENOUS | Status: DC | PRN

## 2016-02-22 MED ORDER — IOPAMIDOL (ISOVUE-300) INJECTION 61%
INTRAVENOUS | Status: DC | PRN
  Administered 2016-02-22: 25 mL via INTRA_ARTERIAL

## 2016-02-22 MED ORDER — OXYCODONE-ACETAMINOPHEN 5-325 MG PO TABS: 1.0000 | ORAL_TABLET | ORAL | Status: DC | PRN

## 2016-02-22 MED ORDER — ACETAMINOPHEN 325 MG PO TABS: 325.0000 mg | ORAL_TABLET | ORAL | Status: DC | PRN

## 2016-02-22 MED ORDER — FENTANYL CITRATE (PF) 100 MCG/2ML IJ SOLN
INTRAMUSCULAR | Status: DC | PRN
  Administered 2016-02-22: 50 ug via INTRAVENOUS

## 2016-02-22 MED ORDER — METHYLPREDNISOLONE SODIUM SUCC 125 MG IJ SOLR: 125.0000 mg | INTRAMUSCULAR | Status: DC | PRN

## 2016-02-22 MED ORDER — FAMOTIDINE 20 MG PO TABS: 40.0000 mg | ORAL_TABLET | ORAL | Status: DC | PRN

## 2016-02-22 MED ORDER — HYDROMORPHONE HCL 1 MG/ML IJ SOLN: 1.0000 mg | Freq: Once | INTRAMUSCULAR | Status: DC

## 2016-02-22 MED ORDER — HEPARIN SODIUM (PORCINE) 10000 UNIT/ML IJ SOLN
INTRAMUSCULAR | Status: AC
  Filled 2016-02-22: qty 1

## 2016-02-22 MED ORDER — SODIUM CHLORIDE 0.9 % IV SOLN
INTRAVENOUS | Status: DC
  Administered 2016-02-22: 11:00:00 via INTRAVENOUS

## 2016-02-22 MED ORDER — PHENOL 1.4 % MT LIQD: 1.0000 | OROMUCOSAL | Status: DC | PRN

## 2016-02-22 MED ORDER — LABETALOL HCL 5 MG/ML IV SOLN: 10.0000 mg | INTRAVENOUS | Status: DC | PRN

## 2016-02-22 MED ORDER — MORPHINE SULFATE (PF) 4 MG/ML IV SOLN: 2.0000 mg | INTRAVENOUS | Status: DC | PRN

## 2016-02-22 MED ORDER — MIDAZOLAM HCL 5 MG/5ML IJ SOLN
INTRAMUSCULAR | Status: AC
  Filled 2016-02-22: qty 5

## 2016-02-22 MED ORDER — MIDAZOLAM HCL 2 MG/2ML IJ SOLN
INTRAMUSCULAR | Status: DC | PRN
  Administered 2016-02-22: 1 mg via INTRAVENOUS

## 2016-02-22 MED ORDER — HEPARIN SODIUM (PORCINE) 1000 UNIT/ML IJ SOLN
INTRAMUSCULAR | Status: AC
  Filled 2016-02-22: qty 1

## 2016-02-22 MED ORDER — CLINDAMYCIN PHOSPHATE 300 MG/50ML IV SOLN
INTRAVENOUS | Status: AC
  Administered 2016-02-22: 11:00:00
  Filled 2016-02-22: qty 50

## 2016-02-22 MED ORDER — METOPROLOL TARTRATE 5 MG/5ML IV SOLN: 2.0000 mg | INTRAVENOUS | Status: DC | PRN

## 2016-02-22 MED ORDER — GUAIFENESIN-DM 100-10 MG/5ML PO SYRP: 15.0000 mL | ORAL_SOLUTION | ORAL | Status: DC | PRN

## 2016-02-22 MED ORDER — HEPARIN (PORCINE) IN NACL 2-0.9 UNIT/ML-% IJ SOLN
INTRAMUSCULAR | Status: AC
  Filled 2016-02-22: qty 1000

## 2016-02-22 MED ORDER — ACETAMINOPHEN 325 MG RE SUPP: 325.0000 mg | RECTAL | Status: DC | PRN

## 2016-02-22 MED ORDER — FENTANYL CITRATE (PF) 100 MCG/2ML IJ SOLN
INTRAMUSCULAR | Status: AC
  Filled 2016-02-22: qty 2

## 2016-02-22 MED ORDER — HYDRALAZINE HCL 20 MG/ML IJ SOLN: 5.0000 mg | INTRAMUSCULAR | Status: DC | PRN

## 2016-02-22 SURGICAL SUPPLY — 18 items
ADH SKN CLS APL DERMABOND .7 (GAUZE/BANDAGES/DRESSINGS) ×1
CANNULA 5F STIFF (CANNULA) ×3 IMPLANT
CATH KA2 5FR 65CM (CATHETERS) ×2 IMPLANT
CATH PALINDROME RT-P 15FX55CM (CATHETERS) ×2 IMPLANT
CATH PALINDROME-P 44CM KIT (CATHETERS) ×3
DERMABOND ADVANCED (GAUZE/BANDAGES/DRESSINGS) ×2
DERMABOND ADVANCED .7 DNX12 (GAUZE/BANDAGES/DRESSINGS) IMPLANT
DRAPE BRACHIAL (DRAPES) ×3 IMPLANT
GLIDEWIRE ADV .035X180CM (WIRE) ×2 IMPLANT
GOWN STRL XL  DISP (MISCELLANEOUS) ×2 IMPLANT
GUIDEWIRE SUPER STIFF .035X180 (WIRE) ×2 IMPLANT
KIT CATH CHRNC PALINDROME PRCS (CATHETERS) IMPLANT
PACK ANGIOGRAPHY (CUSTOM PROCEDURE TRAY) ×3 IMPLANT
PREP CHG 10.5 TEAL (MISCELLANEOUS) ×2 IMPLANT
SHEATH ANL2 6FRX45 HC (SHEATH) ×2 IMPLANT
SHEATH BRITE TIP 6FRX5.5 (SHEATH) ×3 IMPLANT
SUT MNCRL AB 4-0 PS2 18 (SUTURE) ×4 IMPLANT
TOWEL OR 17X26 4PK STRL BLUE (TOWEL DISPOSABLE) ×3 IMPLANT

## 2016-02-22 NOTE — H&P (Signed)
  [facility] History & Physical Update  The patient was interviewed and re-examined.  The patient's previous History and Physical has been reviewed and is unchanged.  There is no change in the plan of care. We plan to proceed with the scheduled procedure.  Jake Goodson, MD  02/22/2016, 8:28 AM

## 2016-02-22 NOTE — Op Note (Signed)
OPERATIVE NOTE    PRE-OPERATIVE DIAGNOSIS: 1. ESRD 2. Left innominate vein occlusion POST-OPERATIVE DIAGNOSIS: same as above with highly stenotic superior vena cava as well  PROCEDURE: 1. Ultrasound guidance for vascular access to the right femoral  Vein 2. Catheter placement into superior vena cava from right femoral approach 3. Superior venacavogram 4. Fluoroscopic guidance for placement of catheter 5. Placement of a 44 cm tip to cuff tunneled hemodialysis catheter via the right femoral vein  SURGEON: DEW,JASON, MD  ANESTHESIA:  Local/MCS  ESTIMATED BLOOD LOSS: 100 cc (including fistulagram as well)  CONTRAST: 10 cc  FINDING(S): 1.  Patent right femoral vein, SVC highly stenotic, unable to cross left innominate vein occlusion  SPECIMEN(S):  None  INDICATIONS:   Patient is a 65 y.o.male who presents with end-stage renal disease and a left innominate vein occlusion that was found on a fistulogram but could not be crossed..   The patient needs long term dialysis access for their ESRD, and a Permcath is necessary.  Risks and benefits are discussed and informed consent is obtained.    DESCRIPTION: After obtaining full informed written consent, the patient was brought back to the vascular suited. The patient's right groin was sterilely prepped and draped and a sterile surgical field was created.  The right femoral vein was visualized with ultrasound and found to be patent. It was then accessed under direct ultrasound guidance and a permanent image was recorded. A wire was placed. I then placed a 6 French sheath in the right groin and used a Kumpe catheter and an advantage wire to track through the atrium and into the superior vena cava. Selective imaging was performed with a Kumpe catheter in the superior vena cava which showed severe vena cava to be highly stenotic and the left innominate vein to be occluded. I attempted to cross the occlusion with the advantage wire and the Kumpe  catheter without any success, similar to attempts to cross this from the arm approach. It was clear at this point that the catheter was going to be necessary particularly given his very high potassium and need for dialysis immediately. I then replaced an Amplatz Super Stiff wire. After skin nick and dilatation, the peel-away sheath was placed over the wire. I then turned my attention to an area about 5 cm inferior and lateral to the access incision and a small counterincision was created.  I tunneled from the counter  incision to the access site. Using fluoroscopic guidance, a 31 centimeterer tip to cuff tunneled hemodialysis catheter was selected, and tunneled from the counter  incision to the access site. It was then placed through the peel-away sheath and the peel-away sheath was removed. Using fluoroscopic guidance the catheter tips were parked in the retrohepatic vena cava just below the right atrium. The appropriate distal connectors were placed. It withdrew blood well and flushed easily with heparinized saline and a concentrated heparin solution was then placed. It was secured to the leg  with 2 Prolene sutures. The access incision was closed single 4-0 Monocryl. A 4-0 Monocryl pursestring suture was placed around the exit site. Sterile dressings were placed. The patient tolerated the procedure well and was taken to the recovery room in stable condition.  COMPLICATIONS: None  CONDITION: Stable    DEW,JASON 02/22/2016 12:57 PM

## 2016-02-22 NOTE — H&P (Signed)
[facility] Admission History & Physical  MRN : [id number]  Noah Lewis is a 65 y.o. (24-Mar-1951) male who presents with chief complaint of No chief complaint on file. Marland Kitchen  History of Present Illness: Patient is referred from his dialysis access center for evaluation of his dialysis access. He has been using this left arm fistula for some time now, but his flow rates are poor and his potassium has been high. He has some fluid retention and swelling. He has no fevers or chills or any signs of systemic infection. He reports no current chest pain or shortness of breath at rest. For further evaluation of his access, a fistulogram has been requested. He presents today for the performance of this.  Current Facility-Administered Medications  Medication Dose Route Frequency Provider Last Rate Last Dose  . 0.9 %  sodium chloride infusion   Intravenous Continuous Kimberly A Stegmayer, PA-C 10 mL/hr at 02/22/16 1059    . clindamycin (CLEOCIN) 300 MG/50ML IVPB           . clindamycin (CLEOCIN) IVPB 300 mg  300 mg Intravenous Once [employer], PA-C      . famotidine (PEPCID) tablet 40 mg  40 mg Oral PRN Janalyn Harder Stegmayer, PA-C      . HYDROmorphone (DILAUDID) injection 1 mg  1 mg Intravenous Once [employer], PA-C      . methylPREDNISolone sodium succinate (SOLU-MEDROL) 125 mg/2 mL injection 125 mg  125 mg Intravenous PRN Kimberly A Stegmayer, PA-C      . ondansetron (ZOFRAN) injection 4 mg  4 mg Intravenous Q6H PRN Sela Hua, PA-C        Past Medical History  Diagnosis Date  . Cancer ([place])   . Arthritis   . Renal disorder   . Hypertension   . Diabetes mellitus without complication ([place])   . Asthma   . COPD (chronic obstructive pulmonary disease) ([place])   . Coronary artery disease     Past Surgical History  Procedure Laterality Date  . No past surgeries    . Insertion of dialysis catheter    . Insertion of dialysis catheter N/A  05/29/2015    Procedure: INSERTION OF DIALYSIS CATHETER;  Surgeon: Angelia Mould, MD;  Location: [facility];  Service: Vascular;  Laterality: N/A;  . Peripheral vascular catheterization N/A 10/25/2015    Procedure: Dialysis/Perma Catheter Insertion;  Surgeon: Katha Cabal, MD;  Location: [facility];  Service: Cardiovascular;  Laterality: N/A;    Social History Social History  Substance Use Topics  . Smoking status: Never Smoker   . Smokeless tobacco: Never Used  . Alcohol Use: No  No IV drug use.  Family History Family History  Problem Relation Age of Onset  . Hypertension Other   . Diabetes Other   No bleeding disorders, clotting disorders, autoimmune diseases, or aneurysms  Allergies  Allergen Reactions  . Penicillins Other (See Comments)    Reaction:  Unknown      REVIEW OF SYSTEMS (Negative unless checked)  Constitutional: [] Weight loss  [] Fever  [] Chills Cardiac: [] Chest pain   [] Chest pressure   [x] Palpitations   [] Shortness of breath when laying flat   [] Shortness of breath at rest   [] Shortness of breath with exertion. Vascular:  [] Pain in legs with walking   [] Pain in legs at rest   [] Pain in legs when laying flat   [] Claudication   [] Pain in feet when walking  [] Pain in feet  at rest  [] Pain in feet when laying flat   [] History of DVT   [] Phlebitis   [x] Swelling in legs   [] Varicose veins   [] Non-healing ulcers Pulmonary:   [] Uses home oxygen   [] Productive cough   [] Hemoptysis   [] Wheeze  [] COPD   [] Asthma Neurologic:  [] Dizziness  [] Blackouts   [] Seizures   [] History of stroke   [] History of TIA  [] Aphasia   [] Temporary blindness   [] Dysphagia   [] Weakness or numbness in arms   [] Weakness or numbness in legs Musculoskeletal:  [] Arthritis   [] Joint swelling   [] Joint pain   [] Low back pain Hematologic:  [] Easy bruising  [] Easy bleeding   [] Hypercoagulable state   [] Anemic  [] Hepatitis Gastrointestinal:  [] Blood in stool   [] Vomiting blood   [] Gastroesophageal reflux/heartburn   [] Difficulty swallowing. Genitourinary:  [x] Chronic kidney disease   [] Difficult urination  [] Frequent urination  [] Burning with urination   [] Blood in urine Skin:  [] Rashes   [] Ulcers   [] Wounds Psychological:  [] History of anxiety   []  History of major depression.  Physical Examination  Filed Vitals:   02/22/16 1023  BP: 132/102  Pulse: 73  Temp: 97.9 F (36.6 C)  TempSrc: Oral  Resp: 19  Height: 5' 7.5" (1.715 m)  Weight: 83.462 kg (184 lb)  SpO2: 100%   Body mass index is 28.38 kg/(m^2). Gen: WD/WN, NAD Head: [place]/AT, No temporalis wasting. Prominent temp pulse not noted. Ear/Nose/Throat: Hearing grossly intact, nares w/o erythema or drainage, oropharynx w/o Erythema/Exudate,  Eyes: PERRLA, EOMI.  Neck: Supple, no nuchal rigidity.  No JVD.  Pulmonary:  Good air movement, equal bilaterally, no use of accessory muscles.  Cardiac: RRR, normal S1, S2 Vascular: thrill and bruit in left arm access Vessel Right Left  Radial Palpable Palpable                                   Gastrointestinal: soft, non-tender/non-distended. No guarding/reflex.  Musculoskeletal: M/S 5/5 throughout.  Extremities without ischemic changes.  No deformity or atrophy.  Neurologic: CN 2-12 intact. Pain and light touch intact in extremities.  Symmetrical.  Speech is fluent. Motor exam as listed above. Psychiatric: Judgment intact, Mood & affect appropriate for pt's clinical situation. Dermatologic: No rashes or ulcers noted.  No cellulitis or open wounds. Lymph : No Cervical, Axillary, or Inguinal lymphadenopathy.      CBC Lab Results  Component Value Date   WBC 12.3* 12/15/2015   HGB 8.5* 12/15/2015   HCT 26.0* 12/15/2015   MCV 84.4 12/15/2015   PLT 364 12/15/2015    BMET    Component Value Date/Time   NA 132* 12/15/2015 1620   K 5.7* 12/15/2015 1620   CL 96* 12/15/2015 1620   CO2 23 12/15/2015 1620   GLUCOSE 105* 12/15/2015 1620   BUN 87*  12/15/2015 1620   CREATININE 9.23* 12/15/2015 1620   CALCIUM 7.5* 12/15/2015 1620   GFRNONAA 5* 12/15/2015 1620   GFRAA 6* 12/15/2015 1620   CrCl cannot be calculated (Patient has no serum creatinine result on file.).  COAG Lab Results  Component Value Date   INR 1.18 12/08/2015   INR 1.22 12/07/2015   INR 1.21 12/06/2015    Radiology No results found.    Assessment/Plan 1. Dysfunction of dialysis access.  4 fistulogram today for further evaluation. 2. End-stage renal disease. Needs better access. 3. Diabetes mellitus. Stable on outpatient medications. 4. Hypertension. Stable on  outpatient medications. 5. Hyperkalemia. Likely secondary to poor dialysis with poorly functioning fistula.   Maris Abascal, MD  02/22/2016 11:02 AM

## 2016-02-22 NOTE — Discharge Instructions (Signed)

## 2016-02-22 NOTE — Op Note (Signed)
[facility]    OPERATIVE NOTE   PROCEDURE: 1.   Left brachiocephalic arteriovenous fistula cannulation under ultrasound guidance 2.   Left arm fistulagram including central venogram   PRE-OPERATIVE DIAGNOSIS: 1. ESRD 2. Poorly functional left arm AVF  POST-OPERATIVE DIAGNOSIS: same as above   SURGEON: Leotis Pain, MD  ANESTHESIA: local with MCS  ESTIMATED BLOOD LOSS: 100 cc (including catheter placement and venogram)  FINDING(S): 1. Left innominate vein occlusion  SPECIMEN(S):  None  CONTRAST: 15  FLUORO TIME: 17.4 minutes including venogram and catheter placement as well  MODERATE CONSCIOUS SEDATION TIME: Approximately 60 minutes with 1 mg of Versed and 50 mcg of Fentanyl including venogram catheter placement as well  INDICATIONS: Noah Lewis is a 65 y.o. male who presents with malfunctioning  left brachiocephalic arteriovenous fistula. This is markedly aneurysmal and has prolonged bleeding and his clearances are very poor with a high potassium. The patient is scheduled for  left arm fistulagram.  The patient is aware the risks include but are not limited to: bleeding, infection, thrombosis of the cannulated access, and possible anaphylactic reaction to the contrast.  The patient is aware of the risks of the procedure and elects to proceed forward.  DESCRIPTION: After full informed written consent was obtained, the patient was brought back to the angiography suite and placed supine upon the angiography table.  The patient was connected to monitoring equipment. Moderate conscious sedation was administered with a face to face encounter with the patient throughout the procedure with my supervision of the RN administering medicines and monitoring the patient's vital signs and mental status throughout from the start of the procedure until the patient was taken to the recovery room. The  left arm was prepped and draped in the standard fashion for a  percutaneous access intervention.  Under ultrasound guidance, the  left brachiocephalic arteriovenous fistula was cannulated with a micropuncture needle under direct ultrasound guidance and a permanent image was performed.  The microwire was advanced into the fistula and the needle was exchanged for the a microsheath.  I then upsized to a 6 Fr Sheath and imaging was performed.  Hand injections were completed to image the access including the central venous system. This demonstrated an extremely tortuous cephalic vein with occlusion of the left innominate vein and the location of her previously placed stent.  Based on the images, this patient will need this area opened or a catheter placement. I then gave the patient 4000 units of intravenous heparin. The cephalic vein was markedly tortuous and I initially used just a Kumpe catheter and a 6 French sheath but could not gain any purchase to try to get across the occlusion. I then exchanged for a 6 [place] Ansell sheath to gain some purchase and used to 65 cm Kumpe catheter. 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 Even using the back end of the wire provided no ability to cross the occlusion from the fistula (left arm) approach. After over 10 minutes of fluoroscopy and multiple attempts it was clear this was not going to be successful from the arm. I elected to perform a venogram from the femoral approach and see if we could cross the occluded stent that way, and if not a catheter would be placed. This procedure will be dictated separately.  A 4-0 Monocryl purse-string suture was sewn around the sheath.  The sheath was removed while tying down the suture.  A sterile bandage was applied to the puncture site.  COMPLICATIONS: None  CONDITION: Stable   DEW,JASON  02/22/2016 12:51 PM

## 2016-02-26 ENCOUNTER — Encounter: Payer: Self-pay | Admitting: Vascular Surgery

## 2016-03-16 ENCOUNTER — Emergency Department
Admission: EM | Admit: 2016-03-16 | Discharge: 2016-03-17 | Disposition: A | Discharge status: Home / Self Care | Payer: Medicaid Other | Attending: Emergency Medicine | Admitting: Emergency Medicine

## 2016-03-16 ENCOUNTER — Other Ambulatory Visit: Payer: Self-pay

## 2016-03-16 DIAGNOSIS — Z7982 Long term (current) use of aspirin: Secondary | ICD-10-CM | POA: Diagnosis not present

## 2016-03-16 DIAGNOSIS — Y828 Other medical devices associated with adverse incidents: Secondary | ICD-10-CM | POA: Insufficient documentation

## 2016-03-16 DIAGNOSIS — T82898A Other specified complication of vascular prosthetic devices, implants and grafts, initial encounter: Secondary | ICD-10-CM | POA: Insufficient documentation

## 2016-03-16 DIAGNOSIS — R1111 Vomiting without nausea: Secondary | ICD-10-CM | POA: Diagnosis not present

## 2016-03-16 DIAGNOSIS — E1122 Type 2 diabetes mellitus with diabetic chronic kidney disease: Secondary | ICD-10-CM | POA: Diagnosis not present

## 2016-03-16 DIAGNOSIS — I4891 Unspecified atrial fibrillation: Secondary | ICD-10-CM | POA: Diagnosis not present

## 2016-03-16 DIAGNOSIS — Z86718 Personal history of other venous thrombosis and embolism: Secondary | ICD-10-CM | POA: Diagnosis not present

## 2016-03-16 DIAGNOSIS — Z992 Dependence on renal dialysis: Secondary | ICD-10-CM | POA: Diagnosis not present

## 2016-03-16 DIAGNOSIS — R109 Unspecified abdominal pain: Secondary | ICD-10-CM | POA: Diagnosis present

## 2016-03-16 DIAGNOSIS — N186 End stage renal disease: Secondary | ICD-10-CM | POA: Diagnosis not present

## 2016-03-16 DIAGNOSIS — R197 Diarrhea, unspecified: Secondary | ICD-10-CM | POA: Insufficient documentation

## 2016-03-16 DIAGNOSIS — I120 Hypertensive chronic kidney disease with stage 5 chronic kidney disease or end stage renal disease: Secondary | ICD-10-CM | POA: Insufficient documentation

## 2016-03-16 DIAGNOSIS — I2510 Atherosclerotic heart disease of native coronary artery without angina pectoris: Secondary | ICD-10-CM | POA: Diagnosis not present

## 2016-03-16 DIAGNOSIS — Z859 Personal history of malignant neoplasm, unspecified: Secondary | ICD-10-CM | POA: Diagnosis not present

## 2016-03-16 DIAGNOSIS — Z79899 Other long term (current) drug therapy: Secondary | ICD-10-CM | POA: Diagnosis not present

## 2016-03-16 DIAGNOSIS — T829XXA Unspecified complication of cardiac and vascular prosthetic device, implant and graft, initial encounter: Secondary | ICD-10-CM

## 2016-03-16 DIAGNOSIS — J449 Chronic obstructive pulmonary disease, unspecified: Secondary | ICD-10-CM | POA: Diagnosis not present

## 2016-03-16 DIAGNOSIS — J45909 Unspecified asthma, uncomplicated: Secondary | ICD-10-CM | POA: Diagnosis not present

## 2016-03-16 DIAGNOSIS — M1990 Unspecified osteoarthritis, unspecified site: Secondary | ICD-10-CM | POA: Diagnosis not present

## 2016-03-16 NOTE — ED Provider Notes (Signed)
[facility] Emergency Department Provider Note  ____________________________________________  Time seen: Approximately 11:35 PM  I have reviewed the triage vital signs and the nursing notes.   HISTORY  Chief Complaint Emesis; Diarrhea; Abdominal Pain; and Bleeding/Bruising    HPI Noah Lewis is a 65 y.o. male who arrives by EMS as emergency traffic for acute onset of severe bleeding from his right femoral vein.  He had dialysis access placed by Dr. Lucky Cowboy about 3 weeks ago in the right groin due to a nonfunctional left arm fistula.He was at home tonight and stated that he was not feeling well with general malaise, some abdominal cramping that was mild, nausea, and feeling like he needed to go to the bathroom.  He went to the bathroom and somehow managed to catch the dialysis catheter on something and you get from his groin.  There is a large volume of blood on the floor when EMS arrived and shortly after they arrived he vomited and had diarrhea and had a near syncopal episode.  After he vomited and had diarrhea he stated that he felt better although he continues to say that he feels poorly without any specific symptom complaints.  The wound was reportedly bleeding heavily when EMS arrived and they apply constant pressure until arrival in the emergency department.  Patient denies chest pain, shortness of breath, fever/chills, or abdominal pain, though he did have some abdominal pain earlier.  Past Medical History  Diagnosis Date  . Cancer ([place])   . Arthritis   . Renal disorder   . Hypertension   . Diabetes mellitus without complication ([place])   . Asthma   . COPD (chronic obstructive pulmonary disease) ([place])   . Coronary artery disease     Patient Active Problem List   Diagnosis Date Noted  . Back pain   . Bacteremia   . A-fib ([place]) 12/04/2015  . Hypertensive urgency 09/20/2015  . 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 Anemia of chronic kidney failure   . Left arm swelling   . End-stage renal disease needing dialysis ([place]) 05/29/2015  . ESRD needing dialysis ([place]) 05/29/2015  . 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 COPD, moderate ([place]) 05/24/2015  . OSA (obstructive sleep apnea) 05/24/2015  . 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 ESRD on hemodialysis ([place]) 05/13/2015  . New onset atrial fibrillation ([place]) 05/13/2015  . Volume overload 04/28/2015  . Bipolar depression ([place]) 04/28/2015  . HTN (hypertension) 04/28/2015  . Chronic back pain 04/28/2015  . Facial swelling 04/28/2015  . Left upper extremity swelling     Past Surgical History  Procedure Laterality Date  . No past surgeries    . Insertion of dialysis catheter    . Insertion of dialysis catheter N/A 05/29/2015    Procedure: INSERTION OF DIALYSIS CATHETER;  Surgeon: Angelia Mould, MD;  Location: [facility];  Service: Vascular;  Laterality: N/A;  . Peripheral vascular catheterization N/A 10/25/2015    Procedure: Dialysis/Perma Catheter Insertion;  Surgeon: Katha Cabal, MD;  Location: [facility];  Service: Cardiovascular;  Laterality: N/A;  . Peripheral vascular catheterization Left 02/22/2016    Procedure: A/V Shuntogram/Fistulagram;  Surgeon: Algernon Huxley, MD;  Location: [facility];  Service: Cardiovascular;  Laterality: Left;    Current Outpatient Rx  Name  Route  Sig  Dispense  Refill  . acetaminophen (TYLENOL) 325  MG tablet   Oral   Take 650 mg by mouth every 6 (six) hours as needed for mild pain, fever or headache.         Marland Kitchen amiodarone (PACERONE) 400 MG tablet   Oral   Take 1 tablet (400 mg total) by mouth daily.   30 tablet   0   . apixaban (ELIQUIS) 5 MG TABS tablet   Oral   Take 5 mg by mouth 2 (two) times daily.         Marland Kitchen aspirin 81 MG chewable tablet   Oral   Chew 1 tablet (81 mg total) by mouth daily.   30  tablet   11   . b complex-vitamin c-folic acid (NEPHRO-VITE) 0.8 MG TABS tablet   Oral   Take 1 tablet by mouth 2 (two) times daily.         . capsaicin (ZOSTRIX) 0.025 % cream   Topical   Apply 1 application topically 2 (two) times daily as needed (for lower back pain).         Marland Kitchen gabapentin (NEURONTIN) 100 MG capsule   Oral   Take 200 mg by mouth at bedtime.          Marland Kitchen ipratropium-albuterol (DUONEB) 0.5-2.5 (3) MG/3ML SOLN   Nebulization   Take 3 mLs by nebulization every 6 (six) hours as needed (for wheezing/shortness of breath).         . lamoTRIgine (LAMICTAL) 25 MG tablet   Oral   Take 50 mg by mouth daily.         Marland Kitchen loratadine (CLARITIN) 10 MG tablet   Oral   Take 1 tablet (10 mg total) by mouth daily.   30 tablet   11   . metoprolol tartrate (LOPRESSOR) 25 MG tablet   Oral   Take 1 tablet (25 mg total) by mouth 2 (two) times daily.         . mirtazapine (REMERON) 15 MG tablet   Oral   Take 15 mg by mouth at bedtime.         . polyethylene glycol (MIRALAX / GLYCOLAX) packet   Oral   Take 17 g by mouth daily.         . sevelamer carbonate (RENVELA) 800 MG tablet   Oral   Take 2,400 mg by mouth 3 (three) times daily with meals.         . Travoprost, BAK Free, (TRAVATAN) 0.004 % SOLN ophthalmic solution   Both Eyes   Place 1 drop into both eyes at bedtime.           Allergies Penicillins  Family History  Problem Relation Age of Onset  . Hypertension Other   . Diabetes Other     Social History Social History  Substance Use Topics  . Smoking status: Never Smoker   . Smokeless tobacco: Never Used  . Alcohol Use: No    Review of Systems Constitutional: No fever/chills Eyes: No visual changes. ENT: No sore throat. Cardiovascular: Denies chest pain. Respiratory: Denies shortness of breath. Gastrointestinal: +abdominal pain.  Emesis x 1, diarrhea x 1.  Genitourinary: Negative for dysuria. Musculoskeletal: Negative for back  pain. Skin: Negative for rash.  Bleeding from right groin Neurological: Negative for headaches, focal weakness or numbness.  10-point ROS otherwise negative.  ____________________________________________   PHYSICAL EXAM:  ED Triage Vitals  Enc Vitals Group     BP 03/16/16 2335 128/105 mmHg     Pulse Rate 03/16/16 2335  72     Resp 03/16/16 2335 20     Temp --      Temp Source 03/16/16 2335 Oral     SpO2 03/16/16 2335 95 %     Weight 03/16/16 2335 186 lb (84.369 kg)     Height 03/16/16 2335 5\' 7"  (1.702 m)     Head Cir --      Peak Flow --      Pain Score --      Pain Loc --      Pain Edu? --      Excl. in [place]? --     Constitutional: Alert but ill-appearing.  Currently, no acute distress Eyes: Conjunctivae are normal. PERRL. EOMI. Head: Atraumatic. Nose: No congestion/rhinnorhea. Mouth/Throat: Mucous membranes are moist.  Oropharynx non-erythematous. Neck: No stridor.  No meningeal signs.   Cardiovascular: Normal rate, regular rhythm. Good peripheral circulation. Grossly normal heart sounds.  There is a wound in the location of his right femoral vein which is not currently actively bleeding.  There is no hematoma or induration at the site. Respiratory: Normal respiratory effort.  No retractions. Lungs CTAB. Gastrointestinal: Soft and nontender. No distention.  Musculoskeletal: No lower extremity tenderness nor edema. No gross deformities of extremities. Neurologic:  Normal speech and language. No gross focal neurologic deficits are appreciated.  Skin:  Skin is warm, dry and intact. No rash noted.   ____________________________________________   LABS (all labs ordered are listed, but only abnormal results are displayed)  Labs Reviewed  CBC - Abnormal; Notable for the following:    RBC 3.67 (*)    Hemoglobin 10.7 (*)    HCT 33.1 (*)    RDW 17.7 (*)    All other components within normal limits  COMPREHENSIVE METABOLIC PANEL - Abnormal; Notable for the following:     CO2 21 (*)    Glucose, Bld 117 (*)    BUN 88 (*)    Creatinine, Ser 14.43 (*)    Calcium 8.5 (*)    Alkaline Phosphatase 127 (*)    Total Bilirubin 0.1 (*)    GFR calc non Af Amer 3 (*)    GFR calc Af Amer 4 (*)    Anion gap 16 (*)    All other components within normal limits  PROTIME-INR - Abnormal; Notable for the following:    Prothrombin Time 16.9 (*)    All other components within normal limits  LIPASE, BLOOD  LACTIC ACID, PLASMA  APTT  TYPE AND SCREEN   ____________________________________________  EKG  ED ECG REPORT I, Malcolm Hetz, the attending physician, personally viewed and interpreted this ECG.  Date: 03/17/2016 EKG Time: 23:37 Rate: 77 Rhythm: Atrial fibrillation QRS Axis: normal Intervals: normal ST/T Wave abnormalities: Inverted T waves in leads V5 and V6 Conduction Disturbances: none Narrative Interpretation: Non-specific ST segment / T-wave changes, but no evidence of acute ischemia.   ____________________________________________  RADIOLOGY   Dg Abd Acute W/chest  03/17/2016  CLINICAL DATA:  65 year old male with abdominal pain, nausea and vomiting EXAM: DG ABDOMEN ACUTE W/ 1V CHEST COMPARISON:  Chest radiograph dated 12/10/2015 and abdominal CT dated 09/20/2015 FINDINGS: The lungs are clear. There is no pleural effusion or pneumothorax. Top-normal cardiac silhouette. Stable appearing left upper mediastinal vascular stent, likely in the innominate vein noted. There is no bowel dilatation or evidence of obstruction. No free air, radiopaque calculi, or foreign object. There is degenerative changes of the spine. No acute osseous pathology. IMPRESSION: No acute cardiopulmonary process. No bowel  obstruction. Electronically Signed   By: Anner Crete M.D.   On: 03/17/2016 01:46    ____________________________________________   PROCEDURES  Procedure(s) performed:   Procedures   ____________________________________________   INITIAL IMPRESSION  / ASSESSMENT AND PLAN / ED COURSE  Pertinent labs & imaging results that were available during my care of the patient were reviewed by me and considered in my medical decision making (see chart for details).  11:45 PM We are applying a pressure dressing to the right groin and I will evaluate probably given the patient was feeling ill before he ripped out his right femoral line.  His blood pressure is stable currently.  He says his last dialysis day was Thursday, which means he must not have gone today as he was scheduled.  I anticipate that I will have to contact vascular surgery given the loss of his right femoral line.  ----------------------------------------- 3:57 AM on 03/17/2016 -----------------------------------------  I spoke by phone with Dr. Delana Meyer right after the patient's arrival and he stated that it would be fine for the patient to follow-up first thing in the morning on Monday for a procedure to establish access.  He felt that there is no indication for admission for axis at this time given that the patient's labs are reassuring.  It has been stable throughout his stay in the emergency department.  He did become hypoxemic at one point but after I spoke with him he stated that he uses oxygen all the time at home and especially at night.  He is going back to [facility] where he states that he does have oxygen.  He said he was fine at this time I would prefer to go back home rather than stay in the hospital.  I have no criteria by which I continues to admit him given the yard he has oxygen at home so I will proceed with discharge and written instructions about returning for vascular access on Monday.  Dr. Delana Meyer has the patient's name and will follow-up with scheduling.  ____________________________________________  FINAL CLINICAL IMPRESSION(S) / ED DIAGNOSES  Final diagnoses:  Complication of vascular access for dialysis, initial encounter [facility])     MEDICATIONS GIVEN  DURING THIS VISIT:  Medications - No data to display   NEW OUTPATIENT MEDICATIONS STARTED DURING THIS VISIT:  New Prescriptions   No medications on file      Note:  This document was prepared using Dragon voice recognition software and may include unintentional dictation errors.   Hinda Kehr, MD 03/17/16 [phone]

## 2016-03-16 NOTE — ED Notes (Signed)
Pt from [facility], got up to use restroom for diarrhea and vomiting, pulled right femoral dialysis access out of groin. Ems states pt with significant amount of bleeding from right femoral. Ems arrives with pressure to right groin, bleeding stopped at this time. Pt with emesis noted to face.

## 2016-03-17 ENCOUNTER — Other Ambulatory Visit: Payer: Self-pay | Admitting: Vascular Surgery

## 2016-03-17 ENCOUNTER — Emergency Department: Payer: Medicaid Other

## 2016-03-17 LAB — COMPREHENSIVE METABOLIC PANEL
ALT: 34 U/L (ref 17–63)
AST: 33 U/L (ref 15–41)
Albumin: 3.7 g/dL (ref 3.5–5.0)
Alkaline Phosphatase: 127 U/L — ABNORMAL HIGH (ref 38–126)
Anion gap: 16 — ABNORMAL HIGH (ref 5–15)
BUN: 88 mg/dL — AB (ref 6–20)
CHLORIDE: 104 mmol/L (ref 101–111)
CO2: 21 mmol/L — AB (ref 22–32)
CREATININE: 14.43 mg/dL — AB (ref 0.61–1.24)
Calcium: 8.5 mg/dL — ABNORMAL LOW (ref 8.9–10.3)
GFR calc Af Amer: 4 mL/min — ABNORMAL LOW (ref >60)
GFR calc non Af Amer: 3 mL/min — ABNORMAL LOW (ref >60)
Glucose, Bld: 117 mg/dL — ABNORMAL HIGH (ref 65–99)
Potassium: 4.8 mmol/L (ref 3.5–5.1)
SODIUM: 141 mmol/L (ref 135–145)
Total Bilirubin: 0.1 mg/dL — ABNORMAL LOW (ref 0.3–1.2)
Total Protein: 7.4 g/dL (ref 6.5–8.1)

## 2016-03-17 LAB — CBC
HEMATOCRIT: 33.1 % — AB (ref 40.0–52.0)
HEMOGLOBIN: 10.7 g/dL — AB (ref 13.0–18.0)
MCH: 29.2 pg (ref 26.0–34.0)
MCHC: 32.5 g/dL (ref 32.0–36.0)
MCV: 90.1 fL (ref 80.0–100.0)
Platelets: 217 10*3/uL (ref 150–440)
RBC: 3.67 MIL/uL — AB (ref 4.40–5.90)
RDW: 17.7 % — ABNORMAL HIGH (ref 11.5–14.5)
WBC: 8.2 10*3/uL (ref 3.8–10.6)

## 2016-03-17 LAB — TYPE AND SCREEN
ABO/RH(D): O POS
Antibody Screen: NEGATIVE

## 2016-03-17 LAB — LACTIC ACID, PLASMA: Lactic Acid, Venous: 1.7 mmol/L (ref 0.5–2.0)

## 2016-03-17 LAB — PROTIME-INR
INR: 1.36
Prothrombin Time: 16.9 seconds — ABNORMAL HIGH (ref 11.4–15.0)

## 2016-03-17 LAB — APTT: APTT: 36 s (ref 24–36)

## 2016-03-17 LAB — LIPASE, BLOOD: Lipase: 39 U/L (ref 11–51)

## 2016-03-17 NOTE — ED Notes (Signed)
Pt requesting oxygen for sleeping. Pt placed on oxygen at 2lpm via [place] per request while sleeping.

## 2016-03-17 NOTE — ED Notes (Signed)
Pt sipping on po fluids.  

## 2016-03-17 NOTE — Discharge Instructions (Signed)
The bleeding from your right femoral line stopped with pressure applied by the paramedics.  Keep a bandage on the wound and have it changed twice daily.  We spoke with the vascular surgeon, Dr. Delana Meyer, who recommended that you do not eat or drink anything after midnight on Sunday and you will be contacted for a procedure to establish a new dialysis line on Monday.  Please call or have someone call for you to the number provided for Dr. Delana Meyer [phone]) on Monday morning to find out what time you should come to the [facility] mall for your procedure.  Again, do not eat or drink anything after midnight on Sunday.

## 2016-03-17 NOTE — ED Notes (Signed)
Pt with controlled bleeding to right groin, coban and pressure dressing in place. Pt with previous dialysis access/shunt to left upper arm. Pt states "that one don't work no more."

## 2016-03-17 NOTE — ED Notes (Signed)
Pt repositioned in bed for comfort.

## 2016-03-17 NOTE — ED Notes (Signed)
Report to phyliss, lpn at [facility].

## 2016-03-17 NOTE — ED Notes (Signed)
Pt sleeping, resps unlabored.  

## 2016-03-17 NOTE — ED Notes (Signed)
Pt placed on room air to monitor pox while sleeping per md requset.

## 2016-03-17 NOTE — ED Notes (Signed)
Pt cleansed of dried blood, diarrhea and emesis. Pt placed in gown, warm blankets provided. Call bell at right side. Pt denies further needs at this time.

## 2016-03-17 NOTE — ED Notes (Signed)
Pt sleeping. 

## 2016-03-18 ENCOUNTER — Other Ambulatory Visit: Payer: Self-pay | Admitting: Vascular Surgery

## 2016-03-19 ENCOUNTER — Encounter: Payer: Self-pay | Admitting: *Deleted

## 2016-03-19 ENCOUNTER — Encounter
Admission: RE | Disposition: A | Discharge status: Home / Self Care | Payer: Self-pay | Source: Ambulatory Visit | Attending: Vascular Surgery

## 2016-03-19 ENCOUNTER — Ambulatory Visit
Admission: RE | Admit: 2016-03-19 | Discharge: 2016-03-19 | Disposition: A | Discharge status: Home / Self Care | Payer: Medicaid Other | Source: Ambulatory Visit | Attending: Vascular Surgery | Admitting: Vascular Surgery

## 2016-03-19 DIAGNOSIS — Z7982 Long term (current) use of aspirin: Secondary | ICD-10-CM | POA: Diagnosis not present

## 2016-03-19 DIAGNOSIS — Z8249 Family history of ischemic heart disease and other diseases of the circulatory system: Secondary | ICD-10-CM | POA: Insufficient documentation

## 2016-03-19 DIAGNOSIS — M545 Low back pain: Secondary | ICD-10-CM | POA: Insufficient documentation

## 2016-03-19 DIAGNOSIS — Z8619 Personal history of other infectious and parasitic diseases: Secondary | ICD-10-CM | POA: Insufficient documentation

## 2016-03-19 DIAGNOSIS — D631 Anemia in chronic kidney disease: Secondary | ICD-10-CM | POA: Insufficient documentation

## 2016-03-19 DIAGNOSIS — I82729 Chronic embolism and thrombosis of deep veins of unspecified upper extremity: Secondary | ICD-10-CM | POA: Insufficient documentation

## 2016-03-19 DIAGNOSIS — I2510 Atherosclerotic heart disease of native coronary artery without angina pectoris: Secondary | ICD-10-CM | POA: Diagnosis not present

## 2016-03-19 DIAGNOSIS — Z9981 Dependence on supplemental oxygen: Secondary | ICD-10-CM | POA: Diagnosis not present

## 2016-03-19 DIAGNOSIS — R197 Diarrhea, unspecified: Secondary | ICD-10-CM | POA: Insufficient documentation

## 2016-03-19 DIAGNOSIS — I160 Hypertensive urgency: Secondary | ICD-10-CM | POA: Diagnosis not present

## 2016-03-19 DIAGNOSIS — J449 Chronic obstructive pulmonary disease, unspecified: Secondary | ICD-10-CM | POA: Diagnosis not present

## 2016-03-19 DIAGNOSIS — T82858A Stenosis of vascular prosthetic devices, implants and grafts, initial encounter: Secondary | ICD-10-CM | POA: Diagnosis not present

## 2016-03-19 DIAGNOSIS — Z886 Allergy status to analgesic agent status: Secondary | ICD-10-CM | POA: Insufficient documentation

## 2016-03-19 DIAGNOSIS — Z992 Dependence on renal dialysis: Secondary | ICD-10-CM | POA: Diagnosis not present

## 2016-03-19 DIAGNOSIS — M1990 Unspecified osteoarthritis, unspecified site: Secondary | ICD-10-CM | POA: Insufficient documentation

## 2016-03-19 DIAGNOSIS — E1122 Type 2 diabetes mellitus with diabetic chronic kidney disease: Secondary | ICD-10-CM | POA: Insufficient documentation

## 2016-03-19 DIAGNOSIS — R1110 Vomiting, unspecified: Secondary | ICD-10-CM | POA: Diagnosis not present

## 2016-03-19 DIAGNOSIS — Z833 Family history of diabetes mellitus: Secondary | ICD-10-CM | POA: Insufficient documentation

## 2016-03-19 DIAGNOSIS — I120 Hypertensive chronic kidney disease with stage 5 chronic kidney disease or end stage renal disease: Secondary | ICD-10-CM | POA: Diagnosis not present

## 2016-03-19 DIAGNOSIS — J45909 Unspecified asthma, uncomplicated: Secondary | ICD-10-CM | POA: Diagnosis not present

## 2016-03-19 DIAGNOSIS — Y832 Surgical operation with anastomosis, bypass or graft as the cause of abnormal reaction of the patient, or of later complication, without mention of misadventure at the time of the procedure: Secondary | ICD-10-CM | POA: Insufficient documentation

## 2016-03-19 DIAGNOSIS — G4733 Obstructive sleep apnea (adult) (pediatric): Secondary | ICD-10-CM | POA: Insufficient documentation

## 2016-03-19 DIAGNOSIS — Z859 Personal history of malignant neoplasm, unspecified: Secondary | ICD-10-CM | POA: Diagnosis not present

## 2016-03-19 DIAGNOSIS — N186 End stage renal disease: Secondary | ICD-10-CM | POA: Insufficient documentation

## 2016-03-19 DIAGNOSIS — I4891 Unspecified atrial fibrillation: Secondary | ICD-10-CM | POA: Diagnosis not present

## 2016-03-19 HISTORY — PX: PERIPHERAL VASCULAR CATHETERIZATION: SHX172C

## 2016-03-19 SURGERY — DIALYSIS/PERMA CATHETER INSERTION
Anesthesia: Moderate Sedation | Laterality: Right

## 2016-03-19 MED ORDER — SODIUM CHLORIDE 0.9 % IV SOLN: INTRAVENOUS | Status: DC

## 2016-03-19 MED ORDER — HEPARIN SODIUM (PORCINE) 10000 UNIT/ML IJ SOLN
INTRAMUSCULAR | Status: AC
  Filled 2016-03-19: qty 1

## 2016-03-19 MED ORDER — FENTANYL CITRATE (PF) 100 MCG/2ML IJ SOLN
INTRAMUSCULAR | Status: AC
  Filled 2016-03-19: qty 2

## 2016-03-19 MED ORDER — HYDRALAZINE HCL 20 MG/ML IJ SOLN
INTRAMUSCULAR | Status: AC
  Filled 2016-03-19: qty 1

## 2016-03-19 MED ORDER — IOPAMIDOL (ISOVUE-300) INJECTION 61%
INTRAVENOUS | Status: DC | PRN
  Administered 2016-03-19: 10 mL via INTRA_ARTERIAL

## 2016-03-19 MED ORDER — FAMOTIDINE 20 MG PO TABS: 40.0000 mg | ORAL_TABLET | ORAL | Status: DC | PRN

## 2016-03-19 MED ORDER — LIDOCAINE-EPINEPHRINE (PF) 1 %-1:200000 IJ SOLN
INTRAMUSCULAR | Status: AC
  Filled 2016-03-19: qty 30

## 2016-03-19 MED ORDER — METHYLPREDNISOLONE SODIUM SUCC 125 MG IJ SOLR: 125.0000 mg | INTRAMUSCULAR | Status: DC | PRN

## 2016-03-19 MED ORDER — LIDOCAINE HCL (PF) 1 % IJ SOLN
INTRAMUSCULAR | Status: AC
  Filled 2016-03-19: qty 30

## 2016-03-19 MED ORDER — HEPARIN (PORCINE) IN NACL 2-0.9 UNIT/ML-% IJ SOLN
INTRAMUSCULAR | Status: AC
  Filled 2016-03-19: qty 500

## 2016-03-19 MED ORDER — MIDAZOLAM HCL 5 MG/5ML IJ SOLN
INTRAMUSCULAR | Status: AC
  Filled 2016-03-19: qty 5

## 2016-03-19 MED ORDER — CLINDAMYCIN PHOSPHATE 300 MG/50ML IV SOLN
300.0000 mg | Freq: Once | INTRAVENOUS | Status: AC
  Administered 2016-03-19: 300 mg via INTRAVENOUS

## 2016-03-19 MED ORDER — HYDROMORPHONE HCL 1 MG/ML IJ SOLN: 1.0000 mg | Freq: Once | INTRAMUSCULAR | Status: DC

## 2016-03-19 MED ORDER — ONDANSETRON HCL 4 MG/2ML IJ SOLN: 4.0000 mg | Freq: Four times a day (QID) | INTRAMUSCULAR | Status: DC | PRN

## 2016-03-19 MED ORDER — HYDRALAZINE HCL 20 MG/ML IJ SOLN
INTRAMUSCULAR | Status: DC | PRN
  Administered 2016-03-19: 20 mg via INTRAVENOUS

## 2016-03-19 MED ORDER — CLINDAMYCIN PHOSPHATE 300 MG/50ML IV SOLN
INTRAVENOUS | Status: AC
  Filled 2016-03-19: qty 50

## 2016-03-19 SURGICAL SUPPLY — 12 items
ADH SKN CLS APL DERMABOND .7 (GAUZE/BANDAGES/DRESSINGS) ×1
CANNULA 5F STIFF (CANNULA) ×2 IMPLANT
CATH PALINDROME-P 44CM KIT (CATHETERS) ×3
DERMABOND ADVANCED (GAUZE/BANDAGES/DRESSINGS) ×2
DERMABOND ADVANCED .7 DNX12 (GAUZE/BANDAGES/DRESSINGS) IMPLANT
GUIDEWIRE SUPER STIFF .035X180 (WIRE) ×2 IMPLANT
KIT CATH 64X44X15FR RVRS (CATHETERS) IMPLANT
KIT CATH CHRNC PALINDROME PRCS (CATHETERS) IMPLANT
PACK ANGIOGRAPHY (CUSTOM PROCEDURE TRAY) ×2 IMPLANT
SUT MNCRL 4-0 (SUTURE) ×3
SUT MNCRL 4-0 27XMFL (SUTURE) ×1
SUTURE MNCRL 4-0 27XMF (SUTURE) IMPLANT

## 2016-03-19 NOTE — Op Note (Signed)
OPERATIVE NOTE   PROCEDURE: 1. Insertion of tunneled dialysis catheter left femoral approach with ultrasound and fluoroscopic guidance. 2. Contrast injection left iliac veins and IVC  PRE-OPERATIVE DIAGNOSIS: Complication of dialysis device; end stage renal disease; superior vena cava syndrome  POST-OPERATIVE DIAGNOSIS: Same, stricture vein  SURGEON: Laree Garron, Dolores Lory.  ANESTHESIA: Conscious sedation was not administered.   ESTIMATED BLOOD LOSS: Minimal cc  CONTRAST USED:  None  FLUOROSCOPY TIME:  0.4 minutes  CONTRAST:  10 cc  INDICATIONS:   Noah Lewis a 65 y.o. y.o. male who presents with no dialysis access. He pulled his femoral catheter out this weekend. 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 He is therefore undergoing placement of a femoral catheter..  DESCRIPTION: After obtaining full informed written consent, the patient was positioned supine. The left femoral was prepped and draped in a sterile fashion. Ultrasound was placed in a sterile sleeve. Ultrasound was utilized to identify the left femoral vein which is noted to be echolucent and compressible indicating patency. Images recorded for the permanent record. Under real-time visualization a Seldinger needle is inserted into the vein and the guidewires advanced without difficulty. Small counterincision was made at the wire insertion site. Dilators are passed over the wire and the tunneled dialysis catheter is fed into the central venous system.  However the catheter would not track over the J-wire suggesting a venous stricture. Attempts at exchanging the wire for an Amplatz wire and then manipulating the catheter were also unsuccessful. Therefore the wire was removed and angiography was performed through the catheter. This did show a moderate stricture of the iliac vein. Given this information the wire and catheter were then manipulated across this area and positioned with the catheter tip at the atrial  caval junction  Under fluoroscopy the catheter tip positioned at the atrial caval junction. The catheter is then approximated to the thigh and an exit site selected. 1% lidocaine is infiltrated in soft tissues at this level small incision is made and the tunneling device is then passed from the exit site to the thigh counterincision. Catheter is then connected to the tunneling device and the catheter was pulled subcutaneously. It is then transected and the hub assembly connected without difficulty. Both lumens aspirate and flush easily. After verification of smooth contour with proper tip position under fluoroscopy the catheter is packed with 5000 units of heparin per lumen.  Unfortunately during the suturing of the catheter he suture needle punctured the catheter and therefore the wire was reintroduced and a new catheter was advanced over the wire through the peel-away sheath peel-away sheath was removed and the catheter tip was positioned at the atrial caval junction. The catheter was then packed with 5000 units of heparin per lumen and secured to the skin of the thigh with 0 silk 0 silk was also placed around the exit site and a pursestring suture.   A sterile dressing is applied with a Biopatch.  COMPLICATIONS: None  CONDITION: Good  Ausha Sieh, Dolores Lory [facility]. Office:  [phone]   03/19/2016,3:16 PM

## 2016-03-19 NOTE — Discharge Instructions (Signed)
Tunneled Catheter Insertion °Catheters are thin, flexible tubes that are inserted into a vein to provide access to the bloodstream. A tunneled catheter is used when a person's bloodstream needs to be accessed many times over a long period, usually longer than 30 days. The catheter provides a painless method of drawing blood, giving blood products, removing waste products from the blood (hemodialysis), and giving medicines. Tunneled catheters can be placed in different parts of the body depending on how they will be used. These catheters are secure and easy to access. A part of the catheter is tunneled under the skin. This is done to decrease the risk of infection.  °There are various types of tunneled catheters. The specific one used will depend on your needs. The catheter can be used right after insertion. °LET YOUR HEALTH CARE PROVIDER KNOW ABOUT:  °· Any allergies you have. °· All medicines you are taking, including vitamins, herbs, eyedrops, and over-the-counter medicines and creams.   °· Previous problems you or members of your family have had with the use of anesthetics.   °· Any blood disorders you have had. °· Possibility of pregnancy, if this applies.   °· Other health problems you have. Also, let your health care provider know if you have a pacemaker. °RISKS AND COMPLICATIONS °Generally, tunneled catheter insertion is a safe procedure. However, as with any surgical procedure, complications can occur. Possible complications include: °· Damage to the blood vessel.   °· Bruising or bleeding at the site of puncture.   °· Introduction of the catheter into an artery instead of a vein.   °· Skin infection at the site of catheter insertion.   °· Bloodstream infection, especially if your white blood cell count is low.   °· Developing a kink in the catheter so it does not work properly.   °· Developing a hole or crack in the catheter.   °· Blockage of the catheter.   °· Getting air in the catheter. °· Blood clots  around the catheter or in the vein near the catheter. °· Disturbance in the normal heart rhythm (rare). This is usually temporary.   °· A collapsed lung during insertion (rare).   °BEFORE THE PROCEDURE  °· You may need to have blood tests done before the day of the procedure.   °· Do not eat or drink anything for at least 8 hours before the procedure or as directed by your health care provider.   °· Ask your health care provider about changing or stopping your regular medicines. °· Avoid wearing jewelry the day of the procedure.   °· Make plans to have someone drive you home after the procedure. You should not drive immediately after the procedure.   °PROCEDURE °· You will be asked to lie on your back. °· A regular intravenous (IV) access tube may be put into a vein in your hand or arm. During the procedure, medicine can flow directly into your body through the IV tube. °· Small monitors will be put on your body. They are used to check your heart, blood pressure, and oxygen level. °· The catheter site is usually shaved, cleaned, and covered with a sterile drape. °· You will be given medicine to numb the area where the catheter will be placed (local anesthetic). You may also be given a medicine to help you relax (sedative). °· The health care provider will then make a small incision in the skin, usually in the lower neck. Another small incision is made a little lower, usually on the shoulder or upper chest. Ultrasonography may be used so that the health   care provider can see the vein and can properly guide the catheter placement. °· X-ray equipment may also be used to help ensure that the catheter is inserted safely and is placed where it will function most effectively. This equipment allows the health care provider to watch the catheter on a live display while guiding it into place. °· Generally, a small guidewire is put into the vein first. A tunnel is created under the skin. The health care provider guides the  movement of the guidewire into a larger vein closer to the heart. °· The catheter is pulled through the tunnel and then moved into the larger vein. The cuff on the catheter is located in the tunnel part. The cuff helps to anchor the catheter in place over time. Stitches are used to keep the catheter in place when first put in. °· An X-ray may be done to make sure the catheter is in the right place. °AFTER THE PROCEDURE  °· You may stay in a recovery area until the sedation has worn off. °· Your heart rate, blood pressure and oxygen level will be monitored. °· You may have some pain and swelling in the neck or shoulder. You will likely be given medicine to control this. °· If this was done as an outpatient procedure, you may be able to go home the same day. °  °This information is not intended to replace advice given to you by your health care provider. Make sure you discuss any questions you have with your health care provider. °  °Document Released: 09/29/2007 Document Revised: 09/30/2014 Document Reviewed: 07/22/2012 °Elsevier Interactive Patient Education ©2016 [employer] ° °

## 2016-03-19 NOTE — H&P (Signed)
[facility] History & Physical Update  The patient was interviewed and re-examined.  The patient's previous History and Physical has been reviewed and is unchanged.  There is no change in the plan of care. We plan to proceed with the scheduled procedure.  Captola Teschner, Dolores Lory, MD  03/19/2016, 1:52 PM

## 2016-03-20 ENCOUNTER — Emergency Department
Admission: EM | Admit: 2016-03-20 | Discharge: 2016-03-20 | Disposition: A | Discharge status: Home / Self Care | Payer: Medicaid Other | Attending: Emergency Medicine | Admitting: Emergency Medicine

## 2016-03-20 ENCOUNTER — Emergency Department: Payer: Medicaid Other

## 2016-03-20 DIAGNOSIS — Z86718 Personal history of other venous thrombosis and embolism: Secondary | ICD-10-CM | POA: Insufficient documentation

## 2016-03-20 DIAGNOSIS — Z7982 Long term (current) use of aspirin: Secondary | ICD-10-CM | POA: Diagnosis not present

## 2016-03-20 DIAGNOSIS — M1990 Unspecified osteoarthritis, unspecified site: Secondary | ICD-10-CM | POA: Insufficient documentation

## 2016-03-20 DIAGNOSIS — N186 End stage renal disease: Secondary | ICD-10-CM | POA: Insufficient documentation

## 2016-03-20 DIAGNOSIS — I120 Hypertensive chronic kidney disease with stage 5 chronic kidney disease or end stage renal disease: Secondary | ICD-10-CM | POA: Insufficient documentation

## 2016-03-20 DIAGNOSIS — J45909 Unspecified asthma, uncomplicated: Secondary | ICD-10-CM | POA: Diagnosis not present

## 2016-03-20 DIAGNOSIS — Z992 Dependence on renal dialysis: Secondary | ICD-10-CM | POA: Diagnosis not present

## 2016-03-20 DIAGNOSIS — I4891 Unspecified atrial fibrillation: Secondary | ICD-10-CM | POA: Insufficient documentation

## 2016-03-20 DIAGNOSIS — F319 Bipolar disorder, unspecified: Secondary | ICD-10-CM | POA: Diagnosis not present

## 2016-03-20 DIAGNOSIS — Z859 Personal history of malignant neoplasm, unspecified: Secondary | ICD-10-CM | POA: Insufficient documentation

## 2016-03-20 DIAGNOSIS — I2510 Atherosclerotic heart disease of native coronary artery without angina pectoris: Secondary | ICD-10-CM | POA: Insufficient documentation

## 2016-03-20 DIAGNOSIS — R58 Hemorrhage, not elsewhere classified: Secondary | ICD-10-CM

## 2016-03-20 DIAGNOSIS — E1122 Type 2 diabetes mellitus with diabetic chronic kidney disease: Secondary | ICD-10-CM | POA: Diagnosis not present

## 2016-03-20 DIAGNOSIS — T82838A Hemorrhage of vascular prosthetic devices, implants and grafts, initial encounter: Secondary | ICD-10-CM | POA: Insufficient documentation

## 2016-03-20 DIAGNOSIS — Z452 Encounter for adjustment and management of vascular access device: Secondary | ICD-10-CM | POA: Diagnosis present

## 2016-03-20 DIAGNOSIS — Y69 Unspecified misadventure during surgical and medical care: Secondary | ICD-10-CM | POA: Diagnosis not present

## 2016-03-20 DIAGNOSIS — J449 Chronic obstructive pulmonary disease, unspecified: Secondary | ICD-10-CM | POA: Insufficient documentation

## 2016-03-20 DIAGNOSIS — I10 Essential (primary) hypertension: Secondary | ICD-10-CM

## 2016-03-20 DIAGNOSIS — N19 Unspecified kidney failure: Secondary | ICD-10-CM

## 2016-03-20 MED ORDER — CLONIDINE HCL 0.1 MG PO TABS
0.1000 mg | ORAL_TABLET | Freq: Once | ORAL | Status: AC
  Administered 2016-03-20: 0.1 mg via ORAL
  Filled 2016-03-20: qty 1

## 2016-03-20 NOTE — ED Notes (Addendum)
Surgicel, gauze, and ice pack applied to pt's left upper thigh where catheter is bleeding from. Pt uncooperative to lift leg to allow this RN to wrap in coban. Dr. Beather Arbour notified.

## 2016-03-20 NOTE — Discharge Instructions (Signed)
You were seen for post operative incisional bleeding around your permacath site. Bleeding was controlled with Surgicel and direct pressure. You may remove the compressive dressing this afternoon. You refused blood draw to check your potassium and are leaving [facility]. I am concerned that your potassium level may be too high because you have missed your previous 2 dialysis appointments. We discussed that high potassium could cause a fatal heart rhythm. You understand this risk and refused blood draw. Do not miss your next dialysis appointment on Thursday. Return to the ER for recurrent or worsening symptoms, persistent vomiting, difficulty breathing or other concerns.  End-Stage Kidney Disease The kidneys are two organs that lie on either side of the spine between the middle of the back and the front of the abdomen. The kidneys:   Remove wastes and extra water from the blood.   Produce important hormones. These help keep bones strong, regulate blood pressure, and help create red blood cells.   Balance the fluids and chemicals in the blood and tissues. End-stage kidney disease occurs when the kidneys are so damaged that they cannot do their job. When the kidneys cannot do their job, life-threatening problems occur. The body cannot stay clean and strong without the help of the kidneys. In end-stage kidney disease, the kidneys cannot get better.You need a new kidney or treatments to do some of the work healthy kidneys do in order to stay alive. CAUSES  End-stage kidney disease usually occurs when a long-lasting (chronic) kidney disease gets worse. It may also occur after the kidneys are suddenly damaged (acute kidney injury).  SYMPTOMS   Swelling (edema) of the legs, ankles, or feet.   Tiredness (lethargy).   Nausea or vomiting.   Confusion.   Problems with urination, such as:   Decreased urine production.   Frequent urination, especially at night.   Frequent  accidents in children who are potty trained.   Muscle twitches and cramps.   Persistent itchiness.   Loss of appetite.   Headaches.   Abnormally dark or light skin.   Numbness in the hands or feet.   Easy bruising.   Frequent hiccups.   Menstruation stops. DIAGNOSIS  Your health care provider will measure your blood pressure and take some tests. These may include:   Urine tests.   Blood tests.   Imaging tests, such as:   An ultrasound exam.   Computed tomography (CT).  A kidney biopsy. TREATMENT  There are two treatments for end-stage kidney disease:   A procedure that removes toxic wastes from the body (dialysis).   Receiving a new kidney (kidney transplant). Both of these treatments have serious risks and consequences. Your health care provider will help you determine which treatment is best for you based on your health, age, and other factors. In addition to having dialysis or a kidney transplant, you may need to take medicines to control high blood pressure (hypertension) and cholesterol and to decrease phosphorus levels in your blood.  HOME CARE INSTRUCTIONS  Follow your prescribed diet.   Take medicines only as directed by your health care provider.   Do not take any new medicines (prescription, over-the-counter, or nutritional supplements) unless approved by your health care provider. Many medicines can worsen your kidney damage or need to have the dose adjusted.   Keep all follow-up visits as directed by your health care provider. MAKE SURE YOU:  Understand these instructions.  Will watch your condition.  Will get help right away if you are  not doing well or get worse.   This information is not intended to replace advice given to you by your health care provider. Make sure you discuss any questions you have with your health care provider.   Document Released: 11/30/2003 Document Revised: 09/30/2014 Document Reviewed:  05/08/2012 Elsevier Interactive Patient Education 2016 [employer].  Hemodialysis Hemodialysis is a way of removing wastes, salt, and extra water from your blood. It is done when your kidneys cannot keep the blood clean. During hemodialysis, your blood travels outside of your body to a machine. A filter in the machine cleans the blood. Hemodialysis is usually done 3 times a week. Visits last 3-5 hours. BEFORE THE PROCEDURE An opening must be made to allow blood to be taken from your body and put back into your body (access). It is made weeks or months before you start hemodialysis. It is usually made in your arm. If you need hemodialysis right away, a thin, flexible tube (catheter) will be placed in your neck, chest, or groin. This type of access is usually temporary. PROCEDURE Hemodialysis is done while you are sitting or leaning back. During hemodialysis you may do any activity as long as you stay sitting or leaning back. Many people sleep, watch television, or read. If you have side effects or feel uncomfortable, tell your doctor. Your doctor may make changes to help you feel more comfortable. Your hemodialysis visits may look like this:  You will be weighed and your temperature will be taken. Your blood pressure and pulse will be checked.  The skin around your access will be cleaned.  Two needles will be put into the access. They will be connected to a plastic tube. The needles will be taped to your skin so that they do not move. If you have a temporary access, your catheter will be connected to a plastic tube.  Your blood will go through the tube to the machine. The machine will clean your blood. Then your blood will go back to your body. Your blood pressure and pulse will be checked a few times.  Once the procedure is complete, the needles will be removed. A bandage (dressing) will be placed over the access. If your access is a catheter, it will be disconnected. AFTER THE  PROCEDURE  You will be weighed.  Your blood will be tested. This is usually done once a month.  You may have side effects. These include:  Dizziness.  Muscle cramps.  Feeling sick to your stomach (nausea).  Headaches.  Feeling tired. You usually feel normal the next day.  Itchiness. Your doctor may give medicine to help with this.   Achy or jittery legs. You may feel like kicking your legs. This can cause sleeping problems.   Allergic reaction. MAKE SURE YOU:  Understand these instructions.  Will watch your condition.  Will get help right away if you are not doing well or get worse.   This information is not intended to replace advice given to you by your health care provider. Make sure you discuss any questions you have with your health care provider.   Document Released: 08/22/2008 Document Revised: 01/04/2013 Document Reviewed: 10/26/2012 Elsevier Interactive Patient Education 2016 [employer].  Hypertension Hypertension, commonly called high blood pressure, is when the force of blood pumping through your arteries is too strong. Your arteries are the blood vessels that carry blood from your heart throughout your body. A blood pressure reading consists of a higher number over a lower number, such  as 110/72. The higher number (systolic) is the pressure inside your arteries when your heart pumps. The lower number (diastolic) is the pressure inside your arteries when your heart relaxes. Ideally you want your blood pressure below 120/80. Hypertension forces your heart to work harder to pump blood. Your arteries may become narrow or stiff. Having untreated or uncontrolled hypertension can cause heart attack, stroke, kidney disease, and other problems. RISK FACTORS Some risk factors for high blood pressure are controllable. Others are not.  Risk factors you cannot control include:   Race. You may be at higher risk if you are African American.  Age. Risk increases with  age.  Gender. Men are at higher risk than women before age 19 years. After age 78, women are at higher risk than men. Risk factors you can control include:  Not getting enough exercise or physical activity.  Being overweight.  Getting too much fat, sugar, calories, or salt in your diet.  Drinking too much alcohol. SIGNS AND SYMPTOMS Hypertension does not usually cause signs or symptoms. Extremely high blood pressure (hypertensive crisis) may cause headache, anxiety, shortness of breath, and nosebleed. DIAGNOSIS To check if you have hypertension, your health care provider will measure your blood pressure while you are seated, with your arm held at the level of your heart. It should be measured at least twice using the same arm. Certain conditions can cause a difference in blood pressure between your right and left arms. A blood pressure reading that is higher than normal on one occasion does not mean that you need treatment. If it is not clear whether you have high blood pressure, you may be asked to return on a different day to have your blood pressure checked again. Or, you may be asked to monitor your blood pressure at home for 1 or more weeks. TREATMENT Treating high blood pressure includes making lifestyle changes and possibly taking medicine. Living a healthy lifestyle can help lower high blood pressure. You may need to change some of your habits. Lifestyle changes may include:  Following the DASH diet. This diet is high in fruits, vegetables, and whole grains. It is low in salt, red meat, and added sugars.  Keep your sodium intake below 2,300 mg per day.  Getting at least 30-45 minutes of aerobic exercise at least 4 times per week.  Losing weight if necessary.  Not smoking.  Limiting alcoholic beverages.  Learning ways to reduce stress. Your health care provider may prescribe medicine if lifestyle changes are not enough to get your blood pressure under control, and if one of  the following is true:  You are 74-64 years of age and your systolic blood pressure is above 140.  You are 57 years of age or older, and your systolic blood pressure is above 150.  Your diastolic blood pressure is above 90.  You have diabetes, and your systolic blood pressure is over [id number] or your diastolic blood pressure is over 90.  You have kidney disease and your blood pressure is above 140/90.  You have heart disease and your blood pressure is above 140/90. Your personal target blood pressure may vary depending on your medical conditions, your age, and other factors. HOME CARE INSTRUCTIONS  Have your blood pressure rechecked as directed by your health care provider.   Take medicines only as directed by your health care provider. Follow the directions carefully. Blood pressure medicines must be taken as prescribed. The medicine does not work as well when you skip doses.  Skipping doses also puts you at risk for problems.  Do not smoke.   Monitor your blood pressure at home as directed by your health care provider. SEEK MEDICAL CARE IF:   You think you are having a reaction to medicines taken.  You have recurrent headaches or feel dizzy.  You have swelling in your ankles.  You have trouble with your vision. SEEK IMMEDIATE MEDICAL CARE IF:  You develop a severe headache or confusion.  You have unusual weakness, numbness, or feel faint.  You have severe chest or abdominal pain.  You vomit repeatedly.  You have trouble breathing. MAKE SURE YOU:   Understand these instructions.  Will watch your condition.  Will get help right away if you are not doing well or get worse.   This information is not intended to replace advice given to you by your health care provider. Make sure you discuss any questions you have with your health care provider.   Document Released: 09/09/2005 Document Revised: 01/24/2015 Document Reviewed: 07/02/2013 Elsevier Interactive Patient  Education [employer].

## 2016-03-20 NOTE — ED Notes (Signed)
Dr. Sung at bedside.  

## 2016-03-20 NOTE — ED Notes (Signed)
Icepack taken off pt's leg.

## 2016-03-20 NOTE — ED Notes (Addendum)
Gauze changed, sandbag reapplied to pt to apply pressure. Pt asking when he can go home.

## 2016-03-20 NOTE — ED Notes (Signed)
Sandbag applied to pt's leg in attempt to apply more pressure and stop the bleeding.

## 2016-03-20 NOTE — ED Notes (Signed)
Pt refusing blood draw.

## 2016-03-20 NOTE — ED Notes (Addendum)
Pt presents to ED via [facility] from [facility] for bleeding from his fistula site on L upper thigh. Bleeding began tonight per EMS. Pt pulled out his old catheter on Saturday and a new one was placed this morning. Pt refused dialysis treatment Saturday. Pt was 97% on 2L nasal cannula with EMS and BP was 67/103. EMS states pt's eyes are swollen.

## 2016-03-20 NOTE — ED Notes (Signed)
Dr. Beather Arbour re-assessed site, sandbag taken off leg. Warm blanket provided to pt.

## 2016-03-20 NOTE — ED Provider Notes (Signed)
[facility] Emergency Department Provider Note   ____________________________________________  Time seen: Approximately 12:49 AM  I have reviewed the triage vital signs and the nursing notes.   HISTORY  Chief Complaint Bleeding catheter site   HPI Noah Lewis is a 65 y.o. male who presents to the ED from nursing home via EMS with a chief complaint of bleeding from dialysis catheter. Patient recently accidentally pulled out his permacath over the weekend; had it replaced to his left thigh yesterday. States "they had a hard time controlling the bleeding" after permacath insertion. Awoke tonight feeling something wet on his leg which was noted to be bleeding from the catheter site. Patient has missed his last 2 dialysis appointments. Presents with hypertension and coarse breathing. Denies fever, chills, chest pain, shortness of breath, abdominal pain, nausea, vomiting, diarrhea. Nothing makes his symptoms better or worse.   Past Medical History  Diagnosis Date  . Cancer ([place])   . Arthritis   . Renal disorder   . Hypertension   . Diabetes mellitus without complication ([place])   . Asthma   . COPD (chronic obstructive pulmonary disease) ([place])   . Coronary artery disease     Patient Active Problem List   Diagnosis Date Noted  . Back pain   . Bacteremia   . A-fib ([place]) 12/04/2015  . Hypertensive urgency 09/20/2015  . 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 Anemia of chronic kidney failure   . Left arm swelling   . End-stage renal disease needing dialysis ([place]) 05/29/2015  . ESRD needing dialysis ([place]) 05/29/2015  . 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 COPD, moderate ([place]) 05/24/2015  . OSA (obstructive sleep apnea) 05/24/2015  . 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 ESRD on hemodialysis ([place]) 05/13/2015  . New onset atrial  fibrillation ([place]) 05/13/2015  . Volume overload 04/28/2015  . Bipolar depression ([place]) 04/28/2015  . HTN (hypertension) 04/28/2015  . Chronic back pain 04/28/2015  . Facial swelling 04/28/2015  . Left upper extremity swelling     Past Surgical History  Procedure Laterality Date  . No past surgeries    . Insertion of dialysis catheter    . Insertion of dialysis catheter N/A 05/29/2015    Procedure: INSERTION OF DIALYSIS CATHETER;  Surgeon: Angelia Mould, MD;  Location: [place];  Service: Vascular;  Laterality: N/A;  . Peripheral vascular catheterization N/A 10/25/2015    Procedure: Dialysis/Perma Catheter Insertion;  Surgeon: Katha Cabal, MD;  Location: [place] [facility];  Service: Cardiovascular;  Laterality: N/A;  . Peripheral vascular catheterization Left 02/22/2016    Procedure: A/V Shuntogram/Fistulagram;  Surgeon: Algernon Huxley, MD;  Location: [place] [facility];  Service: Cardiovascular;  Laterality: Left;    Current Outpatient Rx  Name  Route  Sig  Dispense  Refill  . acetaminophen (TYLENOL) 325 MG tablet   Oral   Take 650 mg by mouth every 6 (six) hours as needed for mild pain, fever or headache.         Marland Kitchen amiodarone (PACERONE) 400 MG tablet   Oral   Take 1 tablet (400 mg total) by mouth daily.   30 tablet   0   . apixaban (ELIQUIS) 5 MG TABS tablet   Oral   Take 5 mg by mouth 2 (two) times daily.         Marland Kitchen aspirin 81 MG chewable  tablet   Oral   Chew 1 tablet (81 mg total) by mouth daily.   30 tablet   11   . b complex-vitamin c-folic acid (NEPHRO-VITE) 0.8 MG TABS tablet   Oral   Take 1 tablet by mouth 2 (two) times daily.         . capsaicin (ZOSTRIX) 0.025 % cream   Topical   Apply 1 application topically 2 (two) times daily as needed (for lower back pain).         Marland Kitchen gabapentin (NEURONTIN) 100 MG capsule   Oral   Take 200 mg by mouth at bedtime.          Marland Kitchen ipratropium-albuterol (DUONEB) 0.5-2.5 (3) MG/3ML SOLN   Nebulization    Take 3 mLs by nebulization every 6 (six) hours as needed (for wheezing/shortness of breath).         . lamoTRIgine (LAMICTAL) 25 MG tablet   Oral   Take 50 mg by mouth daily.         Marland Kitchen loratadine (CLARITIN) 10 MG tablet   Oral   Take 1 tablet (10 mg total) by mouth daily.   30 tablet   11   . metoprolol tartrate (LOPRESSOR) 25 MG tablet   Oral   Take 1 tablet (25 mg total) by mouth 2 (two) times daily.         . mirtazapine (REMERON) 15 MG tablet   Oral   Take 15 mg by mouth at bedtime.         . polyethylene glycol (MIRALAX / GLYCOLAX) packet   Oral   Take 17 g by mouth daily.         . sevelamer carbonate (RENVELA) 800 MG tablet   Oral   Take 2,400 mg by mouth 3 (three) times daily with meals.         . Travoprost, BAK Free, (TRAVATAN) 0.004 % SOLN ophthalmic solution   Both Eyes   Place 1 drop into both eyes at bedtime.           Allergies Penicillins  Family History  Problem Relation Age of Onset  . Hypertension Other   . Diabetes Other     Social History Social History  Substance Use Topics  . Smoking status: Never Smoker   . Smokeless tobacco: Never Used  . Alcohol Use: No    Review of Systems  Constitutional: No fever/chills. Eyes: No visual changes. ENT: No sore throat. Cardiovascular: Denies chest pain. Respiratory: Denies shortness of breath. Gastrointestinal: No abdominal pain.  No nausea, no vomiting.  No diarrhea.  No constipation. Genitourinary: Negative for dysuria. Musculoskeletal: Positive for bleeding from permacath site. Negative for back pain. Skin: Negative for rash. Neurological: Negative for headaches, focal weakness or numbness.  10-point ROS otherwise negative.  ____________________________________________   PHYSICAL EXAM:  VITAL SIGNS: ED Triage Vitals  Enc Vitals Group     BP 03/20/16 0033 167/114 mmHg     Pulse Rate 03/20/16 0033 89     Resp 03/20/16 0033 20     Temp 03/20/16 0033 97.8 F (36.6 C)       Temp Source 03/20/16 0033 Oral     SpO2 --      Weight 03/20/16 0033 196 lb 3.4 oz (89 kg)     Height --      Head Cir --      Peak Flow --      Pain Score 03/20/16 0034 0     Pain  Loc --      Pain Edu? --      Excl. in [place]? --     Constitutional: Alert and oriented. Chronically ill appearing and in no acute distress. Eyes: Conjunctivae are normal. PERRL. EOMI. Head: Atraumatic. Nose: No congestion/rhinnorhea. Mouth/Throat: Mucous membranes are moist.  Oropharynx non-erythematous. Neck: No stridor.   Cardiovascular: Normal rate, regular rhythm. Grossly normal heart sounds.  Good peripheral circulation. Respiratory: Normal respiratory effort.  No retractions. Lungs with rhonchi bilaterally. Gastrointestinal: Soft and nontender. No distention. No abdominal bruits. No CVA tenderness. Musculoskeletal:  Left thigh: Small amount of venous oozing from medial aspect of permacath insertion site. Neurologic:  Normal speech and language. No gross focal neurologic deficits are appreciated. No gait instability. Skin:  Skin is warm, dry and intact. No rash noted. Psychiatric: Mood and affect are normal. Speech and behavior are normal.  ____________________________________________   LABS (all labs ordered are listed, but only abnormal results are displayed)  Labs Reviewed  CBC WITH DIFFERENTIAL/PLATELET  BASIC METABOLIC PANEL  PROTIME-INR   ____________________________________________  EKG  ED ECG REPORT I, SUNG,JADE J, the attending physician, personally viewed and interpreted this ECG.   Date: 03/20/2016  EKG Time: 0038  Rate: 92  Rhythm: atrial fibrillation, rate 92  Axis: RAD  Intervals:none  ST&T Change: Nonspecific  ____________________________________________  RADIOLOGY  Portable chest x-ray (viewed by me, interpreted per Dr. Jimmye Norman): Mild worsening edema. ____________________________________________   PROCEDURES  Procedure(s) performed: None  Critical  Care performed: No  ____________________________________________   INITIAL IMPRESSION / ASSESSMENT AND PLAN / ED COURSE  Pertinent labs & imaging results that were available during my care of the patient were reviewed by me and considered in my medical decision making (see chart for details).  65 year old male who presents with bleeding from permacath insertion site. Appears to be venous oozing from insertion site. Will place surgicel, compressive dressing and ice pack. Will check screening lab work and chest x-ray as patient has missed 2 dialysis appointments  ----------------------------------------- 2:54 AM on 03/20/2016 -----------------------------------------  Patient asleep. Reexamined site which is nonbleeding. Surgicel remains. Will reapply sand bag and observed for a total of 3 hours. Have had several discussions with patient who refuses blood draw. He understands my concern for hyperkalemia given that he has missed 2 dialysis appointments. He also understands that hyperkalemia may cause a fatal cardiac arrhythmia. Knowing this, he continues to refuse lab draw.  ----------------------------------------- 4:31 AM on 03/20/2016 -----------------------------------------  Rechecked permacath. No bleeding. Discussed again with patient my concern for hyperkalemia and he continues to refuse lab draw. Blood pressure elevating, we will administer clonidine. I have stressed to the patient that he must go to dialysis on Thursday as scheduled as well as following up with vascular surgery in 1-2 days. Strict return precautions given. Patient verbalizes understanding and agrees with plan of care. ____________________________________________   FINAL CLINICAL IMPRESSION(S) / ED DIAGNOSES  Final diagnoses:  Bleeding  Essential hypertension  Renal failure      NEW MEDICATIONS STARTED DURING THIS VISIT:  New Prescriptions   No medications on file     Note:  This document was prepared  using Dragon voice recognition software and may include unintentional dictation errors.    Paulette Blanch, MD 03/20/16 [phone]

## 2016-04-22 ENCOUNTER — Ambulatory Visit
Admission: RE | Admit: 2016-04-22 | Discharge: 2016-04-22 | Disposition: A | Discharge status: Skilled Nursing Facility | Payer: Medicaid Other | Source: Ambulatory Visit | Attending: Vascular Surgery | Admitting: Vascular Surgery

## 2016-04-22 ENCOUNTER — Encounter
Admission: RE | Disposition: A | Discharge status: Skilled Nursing Facility | Payer: Self-pay | Source: Ambulatory Visit | Attending: Vascular Surgery

## 2016-04-22 DIAGNOSIS — Z8249 Family history of ischemic heart disease and other diseases of the circulatory system: Secondary | ICD-10-CM | POA: Insufficient documentation

## 2016-04-22 DIAGNOSIS — I120 Hypertensive chronic kidney disease with stage 5 chronic kidney disease or end stage renal disease: Secondary | ICD-10-CM | POA: Insufficient documentation

## 2016-04-22 DIAGNOSIS — Z992 Dependence on renal dialysis: Secondary | ICD-10-CM | POA: Insufficient documentation

## 2016-04-22 DIAGNOSIS — E1122 Type 2 diabetes mellitus with diabetic chronic kidney disease: Secondary | ICD-10-CM | POA: Diagnosis not present

## 2016-04-22 DIAGNOSIS — Z880 Allergy status to penicillin: Secondary | ICD-10-CM | POA: Insufficient documentation

## 2016-04-22 DIAGNOSIS — Z859 Personal history of malignant neoplasm, unspecified: Secondary | ICD-10-CM | POA: Insufficient documentation

## 2016-04-22 DIAGNOSIS — N186 End stage renal disease: Secondary | ICD-10-CM | POA: Insufficient documentation

## 2016-04-22 DIAGNOSIS — Z833 Family history of diabetes mellitus: Secondary | ICD-10-CM | POA: Insufficient documentation

## 2016-04-22 DIAGNOSIS — I2510 Atherosclerotic heart disease of native coronary artery without angina pectoris: Secondary | ICD-10-CM | POA: Insufficient documentation

## 2016-04-22 DIAGNOSIS — Z9889 Other specified postprocedural states: Secondary | ICD-10-CM | POA: Diagnosis not present

## 2016-04-22 DIAGNOSIS — Z452 Encounter for adjustment and management of vascular access device: Secondary | ICD-10-CM | POA: Insufficient documentation

## 2016-04-22 DIAGNOSIS — J449 Chronic obstructive pulmonary disease, unspecified: Secondary | ICD-10-CM | POA: Insufficient documentation

## 2016-04-22 DIAGNOSIS — M1990 Unspecified osteoarthritis, unspecified site: Secondary | ICD-10-CM | POA: Insufficient documentation

## 2016-04-22 HISTORY — PX: PERIPHERAL VASCULAR CATHETERIZATION: SHX172C

## 2016-04-22 SURGERY — DIALYSIS/PERMA CATHETER REMOVAL
Anesthesia: Moderate Sedation

## 2016-04-22 MED ORDER — LIDOCAINE-EPINEPHRINE (PF) 1 %-1:200000 IJ SOLN
INTRAMUSCULAR | Status: AC
  Filled 2016-04-22: qty 30

## 2016-04-22 SURGICAL SUPPLY — 3 items
FORCEPS HALSTEAD CVD 5IN STRL (INSTRUMENTS) ×2 IMPLANT
TOWEL OR 17X26 4PK STRL BLUE (TOWEL DISPOSABLE) ×2 IMPLANT
TRAY LACERAT/PLASTIC (MISCELLANEOUS) ×2 IMPLANT

## 2016-04-22 NOTE — Progress Notes (Signed)
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 Patient has had no medications today, last dose of Eliquis was the 19th.

## 2016-04-22 NOTE — H&P (Signed)
[facility] Admission History & Physical  MRN : [id number]  Noah Lewis is a 65 y.o. (04-06-1951) male who presents with chief complaint of No chief complaint on file. Marland Kitchen  History of Present Illness: Patient is here today for elective removal of his PermCath. He has end-stage renal disease. His dialysis access is now functional, and his catheter can be removed. He has no other complaints today.  No current facility-administered medications for this encounter.     Past Medical History:  Diagnosis Date  . Arthritis   . Asthma   . Cancer ([place])   . COPD (chronic obstructive pulmonary disease) ([place])   . Coronary artery disease   . Diabetes mellitus without complication ([place])   . Hypertension   . Renal disorder     Past Surgical History:  Procedure Laterality Date  . INSERTION OF DIALYSIS CATHETER    . INSERTION OF DIALYSIS CATHETER N/A 05/29/2015   Procedure: INSERTION OF DIALYSIS CATHETER;  Surgeon: Angelia Mould, MD;  Location: [place];  Service: Vascular;  Laterality: N/A;  . NO PAST SURGERIES    . PERIPHERAL VASCULAR CATHETERIZATION N/A 10/25/2015   Procedure: Dialysis/Perma Catheter Insertion;  Surgeon: Katha Cabal, MD;  Location: [place] [facility];  Service: Cardiovascular;  Laterality: N/A;  . PERIPHERAL VASCULAR CATHETERIZATION Left 02/22/2016   Procedure: A/V Shuntogram/Fistulagram;  Surgeon: Algernon Huxley, MD;  Location: [place] [facility];  Service: Cardiovascular;  Laterality: Left;  . PERIPHERAL VASCULAR CATHETERIZATION Right 03/19/2016   Procedure: Dialysis/Perma Catheter Insertion;  Surgeon: Katha Cabal, MD;  Location: [place] [facility];  Service: Cardiovascular;  Laterality: Right;    Social History Social History  Substance Use Topics  . Smoking status: Never Smoker  . Smokeless tobacco: Never Used  . Alcohol use No    Family History Family History  Problem Relation Age of Onset  . Hypertension Other   .  Diabetes Other   No family history of bleeding disorders, clotting disorders, or autoimmune diseases  Allergies  Allergen Reactions  . Penicillins Other (See Comments)    Reaction:  Unknown      REVIEW OF SYSTEMS (Negative unless checked)  Constitutional: [] Weight loss  [] Fever  [] Chills Cardiac: [] Chest pain   [] Chest pressure   [] Palpitations   [] Shortness of breath when laying flat   [] Shortness of breath at rest   [] Shortness of breath with exertion. Vascular:  [] Pain in legs with walking   [] Pain in legs at rest   [] Pain in legs when laying flat   [] Claudication   [] Pain in feet when walking  [] Pain in feet at rest  [] Pain in feet when laying flat   [] History of DVT   [] Phlebitis   [] Swelling in legs   [] Varicose veins   [] Non-healing ulcers Pulmonary:   [] Uses home oxygen   [] Productive cough   [] Hemoptysis   [] Wheeze  [] COPD   [] Asthma Neurologic:  [] Dizziness  [] Blackouts   [] Seizures   [] History of stroke   [] History of TIA  [] Aphasia   [] Temporary blindness   [] Dysphagia   [] Weakness or numbness in arms   [] Weakness or numbness in legs Musculoskeletal:  [] Arthritis   [] Joint swelling   [] Joint pain   [] Low back pain Hematologic:  [] Easy bruising  [] Easy bleeding   [] Hypercoagulable state   [] Anemic  [] Hepatitis Gastrointestinal:  [] Blood in stool   [] Vomiting blood  [] Gastroesophageal reflux/heartburn   [] Difficulty swallowing. Genitourinary:  [x] Chronic kidney disease   [] Difficult  urination  [] Frequent urination  [] Burning with urination   [] Blood in urine Skin:  [] Rashes   [] Ulcers   [] Wounds Psychological:  [] History of anxiety   []  History of major depression.  Physical Examination  Vitals:   04/22/16 1051  BP: (!) 145/109  Pulse: 78  Resp: 13  Temp: 97.7 F (36.5 C)  TempSrc: Oral  SpO2: 100%  Weight: 88.9 kg (196 lb)  Height: 5\' 7"  (1.702 m)   Body mass index is 30.7 kg/m. Gen: WD/WN, NAD Head: [place]/AT, No temporalis wasting. Prominent temp pulse not  noted. Ear/Nose/Throat: Hearing grossly intact, nares w/o erythema or drainage, oropharynx w/o Erythema/Exudate,  Eyes: PERRLA, EOMI.  Neck: Supple, no nuchal rigidity.  No JVD.  Pulmonary:  Good air movement, no use of accessory muscles.  Cardiac: RRR, normal S1, S2 Vascular: Thrill present and left arm AV access Vessel Right Left  Radial Palpable Palpable                                   Gastrointestinal: soft, non-tender/non-distended. No guarding/reflex.  Musculoskeletal: M/S 5/5 throughout.  Extremities without ischemic changes.  No deformity or atrophy.  Neurologic: CN 2-12 intact. Pain and light touch intact in extremities.  Symmetrical.  Speech is fluent. Motor exam as listed above. Psychiatric: Judgment intact, Mood & affect appropriate for pt's clinical situation. Dermatologic: No rashes or ulcers noted.  No cellulitis or open wounds. Lymph : No Cervical, Axillary, or Inguinal lymphadenopathy.    CBC Lab Results  Component Value Date   WBC 8.2 03/16/2016   HGB 10.7 (L) 03/16/2016   HCT 33.1 (L) 03/16/2016   MCV 90.1 03/16/2016   PLT 217 03/16/2016    BMET    Component Value Date/Time   NA 141 03/16/2016 2350   K 4.8 03/16/2016 2350   CL 104 03/16/2016 2350   CO2 21 (L) 03/16/2016 2350   GLUCOSE 117 (H) 03/16/2016 2350   BUN 88 (H) 03/16/2016 2350   CREATININE 14.43 (H) 03/16/2016 2350   CALCIUM 8.5 (L) 03/16/2016 2350   GFRNONAA 3 (L) 03/16/2016 2350   GFRAA 4 (L) 03/16/2016 2350   CrCl cannot be calculated (Patient's most recent lab result is older than the maximum 21 days allowed.).  COAG Lab Results  Component Value Date   INR 1.36 03/16/2016   INR 1.18 12/08/2015   INR 1.22 12/07/2015    Radiology No results found.    Assessment/Plan 1. End-stage renal disease with functional dialysis access. Okay to remove his PermCath today. Risks and benefits discussed. 2. Hypertension. Stable. No change in outpatient medications. 3. Diabetes.  Stable. No change in outpatient medications.   Jarquis Walker, MD  04/22/2016 11:24 AM

## 2016-04-22 NOTE — Op Note (Signed)
Operative Note     Preoperative diagnosis:   1. ESRD with functional permanent access  Postoperative diagnosis:  1. ESRD with functional permanent access  Procedure:  Removal of left femoral Permcath  Surgeon:  Leotis Pain, MD  Anesthesia:  Local  EBL:  Minimal  Indication for the Procedure:  The patient has a functional permanent dialysis access and no longer needs their permcath.  This can be removed.  Risks and benefits are discussed and informed consent is obtained.  Description of the Procedure:  The patient's left groin and existing catheter were sterilely prepped and draped. The area around the catheter was anesthetized copiously with 1% lidocaine. The catheter was dissected out with curved hemostats until the cuff was freed from the surrounding fibrous sheath. The fiber sheath was transected, and the catheter was then removed in its entirety using gentle traction. Pressure was held and sterile dressings were placed. The patient tolerated the procedure well and was taken to the recovery room in stable condition.     [name]  04/22/2016, 11:59 AM

## 2016-04-23 ENCOUNTER — Encounter: Payer: Self-pay | Admitting: Vascular Surgery

## 2016-04-23 MED ORDER — LIDOCAINE-EPINEPHRINE (PF) 1 %-1:200000 IJ SOLN
INTRAMUSCULAR | Status: DC | PRN
  Administered 2016-04-22: 10 mL via INTRADERMAL

## 2016-05-02 ENCOUNTER — Encounter: Payer: Self-pay | Admitting: Internal Medicine

## 2016-05-02 ENCOUNTER — Non-Acute Institutional Stay (SKILLED_NURSING_FACILITY): Payer: Medicaid Other | Admitting: Adult Health

## 2016-05-02 ENCOUNTER — Encounter: Payer: Self-pay | Admitting: Adult Health

## 2016-05-02 DIAGNOSIS — J449 Chronic obstructive pulmonary disease, unspecified: Secondary | ICD-10-CM | POA: Diagnosis not present

## 2016-05-02 DIAGNOSIS — G4733 Obstructive sleep apnea (adult) (pediatric): Secondary | ICD-10-CM | POA: Diagnosis not present

## 2016-05-02 DIAGNOSIS — N186 End stage renal disease: Secondary | ICD-10-CM | POA: Diagnosis not present

## 2016-05-02 DIAGNOSIS — N185 Chronic kidney disease, stage 5: Secondary | ICD-10-CM | POA: Diagnosis not present

## 2016-05-02 DIAGNOSIS — D631 Anemia in chronic kidney disease: Secondary | ICD-10-CM

## 2016-05-02 DIAGNOSIS — G8929 Other chronic pain: Secondary | ICD-10-CM | POA: Diagnosis not present

## 2016-05-02 DIAGNOSIS — E1122 Type 2 diabetes mellitus with diabetic chronic kidney disease: Secondary | ICD-10-CM

## 2016-05-02 DIAGNOSIS — Z992 Dependence on renal dialysis: Secondary | ICD-10-CM | POA: Diagnosis not present

## 2016-05-02 DIAGNOSIS — M549 Dorsalgia, unspecified: Secondary | ICD-10-CM

## 2016-05-02 DIAGNOSIS — I82722 Chronic embolism and thrombosis of deep veins of left upper extremity: Secondary | ICD-10-CM | POA: Diagnosis not present

## 2016-05-02 NOTE — Progress Notes (Signed)
Patient ID: Noah Lewis, male   DOB: June 18, 1951, 65 y.o.   MRN: [id number]    HISTORY AND PHYSICAL   DATE: 05/02/16  Location:    [place] Room Number: 215 B Place of Service: SNF (31)   Extended Emergency Contact Information Primary Emergency Contact: Hayes,Barbara Address: [place], [place] [address] [place] Phone: [phone] Relation: Mother Secondary Emergency Contact: Hayes,[name] Address: [address]          [place], [place] [address] [place] Phone: [phone] Relation: Brother  Advanced Directive information Does patient have an advance directive?: Yes, Type of Advance Directive: Out of facility DNR (pink MOST or yellow form), Does patient want to make changes to advanced directive?: No - Patient declined  Chief Complaint  Patient presents with  . New Admit To SNF    HPI:  65 yo male seen today as a new admission into SNF from another SNF. He has a hx ESRD/HD,  Afib, HTN, OSA, COPD, DM, AOCD, CAD, bipolar/depression, chronic back pain.  He had permcath removed on 7/31st. He will get HD via left arm AVF. He refused HD today per nursing.   Lab Results  Component Value Date   HGBA1C 5.7 (H) 04/28/2015     Past Medical History:  Diagnosis Date  . Arthritis   . Asthma   . Cancer ([place])   . COPD (chronic obstructive pulmonary disease) ([place])   . Coronary artery disease   . Diabetes mellitus without complication ([place])   . Hypertension   . Renal disorder     Past Surgical History:  Procedure Laterality Date  . INSERTION OF DIALYSIS CATHETER    . INSERTION OF DIALYSIS CATHETER N/A 05/29/2015   Procedure: INSERTION OF DIALYSIS CATHETER;  Surgeon: Angelia Mould, MD;  Location: [place];  Service: Vascular;  Laterality: N/A;  . NO PAST SURGERIES    . PERIPHERAL VASCULAR CATHETERIZATION N/A 10/25/2015   Procedure: Dialysis/Perma Catheter Insertion;  Surgeon: Katha Cabal, MD;  Location: [place] [facility];  Service: Cardiovascular;  Laterality: N/A;  . PERIPHERAL VASCULAR CATHETERIZATION Left 02/22/2016   Procedure: A/V Shuntogram/Fistulagram;  Surgeon: Algernon Huxley, MD;  Location: [place] [facility];  Service: Cardiovascular;  Laterality: Left;  . PERIPHERAL VASCULAR CATHETERIZATION Right 03/19/2016   Procedure: Dialysis/Perma Catheter Insertion;  Surgeon: Katha Cabal, MD;  Location: [facility];  Service: Cardiovascular;  Laterality: Right;  . PERIPHERAL VASCULAR CATHETERIZATION N/A 04/22/2016   Procedure: Dialysis/Perma Catheter Removal;  Surgeon: Algernon Huxley, MD;  Location: [facility];  Service: Cardiovascular;  Laterality: N/A;    Patient Care Team: Theotis Burrow, MD as PCP - General (Family Medicine)  Social History   Social History  . Marital status: Legally Separated    Spouse name: N/A  . Number of children: N/A  . Years of education: N/A   Occupational History  . Not on file.   Social History Main Topics  . Smoking status: Never Smoker  . Smokeless tobacco: Never Used  . Alcohol use No  . Drug use: No  . Sexual activity: Not on file   Other Topics Concern  . Not on file   Social History Narrative  . No narrative on file     reports that he has never smoked. He has never used smokeless tobacco. He reports that he does not  drink alcohol or use drugs.  Family History  Problem Relation Age of Onset  . Hypertension Other   . Diabetes Other    Family Status  Relation Status  . Mother Alive  . Father Deceased  . Other     Immunization History  Administered Date(s) Administered  . PPD Test 05/01/2016    Allergies  Allergen Reactions  . Penicillins Other (See Comments)    Reaction:  Unknown     Medications: Patient's Medications  New Prescriptions   No medications on file  Previous Medications   ACETAMINOPHEN (TYLENOL) 325 MG TABLET    Take 650 mg by mouth every 6 (six) hours  as needed for mild pain, fever or headache.   AMIODARONE (PACERONE) 400 MG TABLET    Take 1 tablet (400 mg total) by mouth daily.   APIXABAN (ELIQUIS) 5 MG TABS TABLET    Take 5 mg by mouth 2 (two) times daily.   ASPIRIN 81 MG CHEWABLE TABLET    Chew 1 tablet (81 mg total) by mouth daily.   B COMPLEX-VITAMIN C-FOLIC ACID (NEPHRO-VITE) 0.8 MG TABS TABLET    Take 1 tablet by mouth 2 (two) times daily.   CAPSAICIN (ZOSTRIX) 0.025 % CREAM    Apply 1 application topically 2 (two) times daily as needed (for lower back pain).   EMOLLIENT (EUCERIN) LOTION    Apply topically. Apply to dry areas two times daily   IPRATROPIUM-ALBUTEROL (DUONEB) 0.5-2.5 (3) MG/3ML SOLN    Take 3 mLs by nebulization every 6 (six) hours as needed (for wheezing/shortness of breath).   LAMOTRIGINE (LAMICTAL) 100 MG TABLET    Take 100 mg by mouth daily.   LORATADINE (CLARITIN) 10 MG TABLET    Take 1 tablet (10 mg total) by mouth daily.   METOPROLOL TARTRATE (LOPRESSOR) 25 MG TABLET    Take 1 tablet (25 mg total) by mouth 2 (two) times daily.   MIRTAZAPINE (REMERON) 15 MG TABLET    Take 15 mg by mouth at bedtime.   POLYETHYLENE GLYCOL (MIRALAX / GLYCOLAX) PACKET    Take 17 g by mouth daily as needed for mild constipation.    SEVELAMER CARBONATE (RENVELA) 800 MG TABLET    Take 2,400 mg by mouth 3 (three) times daily with meals.   TRAVOPROST, BAK FREE, (TRAVATAN) 0.004 % SOLN OPHTHALMIC SOLUTION    Place 1 drop into both eyes at bedtime.  Modified Medications   No medications on file  Discontinued Medications   GABAPENTIN (NEURONTIN) 100 MG CAPSULE    Take 200 mg by mouth at bedtime.     Review of Systems  Vitals:   05/02/16 1143  BP: (!) 150/80  Pulse: 100  Resp: (!) 22  Temp: 97.7 F (36.5 C)  TempSrc: Oral  SpO2: 97%  Weight: 191 lb (86.6 kg)  Height: _0  (1.702 m)   Body mass index is 29.91 kg/m.  Physical Exam   Labs reviewed: Admission on 03/16/2016, Discharged on 03/17/2016  Component Date Value Ref  Range Status  . WBC 03/17/2016 8.2  3.8 - 10.6 K/uL Final  . RBC 03/17/2016 3.67* 4.40 - 5.90 MIL/uL Final  . Hemoglobin 03/17/2016 10.7* 13.0 - 18.0 g/dL Final  . HCT 03/17/2016 33.1* 40.0 - 52.0 % Final  . MCV 03/17/2016 90.1  80.0 - 100.0 fL Final  . MCH 03/17/2016 29.2  26.0 - 34.0 pg Final  . MCHC 03/17/2016 32.5  32.0 - 36.0 g/dL Final  . RDW 03/17/2016 17.7* 11.5 -  14.5 % Final  . Platelets 03/17/2016 217  150 - 440 K/uL Final  . ABO/RH(D) 03/17/2016 O POS   Final  . Antibody Screen 03/17/2016 NEG   Final  . Sample Expiration 03/17/2016 03/19/2016   Final  . Sodium 03/17/2016 141  135 - 145 mmol/L Final  . Potassium 03/17/2016 4.8  3.5 - 5.1 mmol/L Final  . Chloride 03/17/2016 104  101 - 111 mmol/L Final  . CO2 03/17/2016 21* 22 - 32 mmol/L Final  . Glucose, Bld 03/17/2016 117* 65 - 99 mg/dL Final  . BUN 03/17/2016 88* 6 - 20 mg/dL Final  . Creatinine, Ser 03/17/2016 14.43* 0.61 - 1.24 mg/dL Final  . Calcium 03/17/2016 8.5* 8.9 - 10.3 mg/dL Final  . Total Protein 03/17/2016 7.4  6.5 - 8.1 g/dL Final  . Albumin 03/17/2016 3.7  3.5 - 5.0 g/dL Final  . AST 03/17/2016 33  15 - 41 U/L Final  . ALT 03/17/2016 34  17 - 63 U/L Final  . Alkaline Phosphatase 03/17/2016 127* 38 - 126 U/L Final  . Total Bilirubin 03/17/2016 0.1* 0.3 - 1.2 mg/dL Final  . GFR calc non Af Amer 03/17/2016 3* >60 mL/min Final  . GFR calc Af Amer 03/17/2016 4* >60 mL/min Final   Comment: (NOTE) The eGFR has been calculated using the CKD EPI equation. This calculation has not been validated in all clinical situations. eGFR's persistently <60 mL/min signify possible Chronic Kidney Disease.   . Anion gap 03/17/2016 16* 5 - 15 Final  . Lipase 03/17/2016 39  11 - 51 U/L Final  . Lactic Acid, Venous 03/17/2016 1.7  0.5 - 2.0 mmol/L Final  . Prothrombin Time 03/17/2016 16.9* 11.4 - 15.0 seconds Final  . INR 03/17/2016 1.36   Final  . aPTT 03/17/2016 36  24 - 36 seconds Final  Admission on 02/22/2016,  Discharged on 02/22/2016  Component Date Value Ref Range Status  . Potassium ([facility] vascular lab) 02/22/2016 6.5* 3.5 - 5.1 Final   Comment: CRITICAL RESULT CALLED TO, READ BACK BY AND VERIFIED WITH:  CALLED TO RN ANGIE SHAMBLY 1058, 02/22/2016, LS     No results found.   Assessment/Plan    Moyinoluwa Dawe S. Perlie Gold  [facility] [address] [place], [place] [address] [phone] Cell (Monday-Friday 8 AM - 5 PM) [phone] After 5 PM and follow prompts

## 2016-05-02 NOTE — Progress Notes (Signed)
This encounter was created in error - please disregard.

## 2016-05-02 NOTE — Progress Notes (Signed)
Patient ID: Noah Lewis, male   DOB: [birth year], 65 y.o.   MRN: [id number]   Location:   [place] Room Number: 215-B Place of Service:  SNF (31)   CODE STATUS: Full Code until otherwise determine  Allergies  Allergen Reactions  . Penicillins Other (See Comments)    Reaction:  Unknown     Chief Complaint  Patient presents with  . Hospitalization Follow-up    Follow up    HPI:    Past Medical History:  Diagnosis Date  . Arthritis   . Asthma   . Cancer ([place])   . COPD (chronic obstructive pulmonary disease) ([place])   . Coronary artery disease   . Diabetes mellitus without complication ([place])   . Hypertension   . Renal disorder     Past Surgical History:  Procedure Laterality Date  . INSERTION OF DIALYSIS CATHETER    . INSERTION OF DIALYSIS CATHETER N/A 05/29/2015   Procedure: INSERTION OF DIALYSIS CATHETER;  Surgeon: Angelia Mould, MD;  Location: [facility];  Service: Vascular;  Laterality: N/A;  . NO PAST SURGERIES    . PERIPHERAL VASCULAR CATHETERIZATION N/A 10/25/2015   Procedure: Dialysis/Perma Catheter Insertion;  Surgeon: Katha Cabal, MD;  Location: [place] [facility];  Service: Cardiovascular;  Laterality: N/A;  . PERIPHERAL VASCULAR CATHETERIZATION Left 02/22/2016   Procedure: A/V Shuntogram/Fistulagram;  Surgeon: Algernon Huxley, MD;  Location: [facility];  Service: Cardiovascular;  Laterality: Left;  . PERIPHERAL VASCULAR CATHETERIZATION Right 03/19/2016   Procedure: Dialysis/Perma Catheter Insertion;  Surgeon: Katha Cabal, MD;  Location: [facility];  Service: Cardiovascular;  Laterality: Right;  . PERIPHERAL VASCULAR CATHETERIZATION N/A 04/22/2016   Procedure: Dialysis/Perma Catheter Removal;  Surgeon: Algernon Huxley, MD;  Location: [facility];  Service: Cardiovascular;  Laterality: N/A;    Social History   Social History  . Marital status: Legally Separated    Spouse name: N/A  . Number of children: N/A    . Years of education: N/A   Occupational History  . Not on file.   Social History Main Topics  . Smoking status: Never Smoker  . Smokeless tobacco: Never Used  . Alcohol use No  . Drug use: No  . Sexual activity: Not on file   Other Topics Concern  . Not on file   Social History Narrative  . No narrative on file   Family History  Problem Relation Age of Onset  . Hypertension Other   . Diabetes Other       VITAL SIGNS BP (!) 150/80   Pulse 100   Ht 5\' 7"  (1.702 m)   Wt 191 lb (86.6 kg)   BMI 29.91 kg/m   Patient's Medications  New Prescriptions   No medications on file  Previous Medications   ACETAMINOPHEN (TYLENOL) 325 MG TABLET    Take 650 mg by mouth every 6 (six) hours as needed for mild pain, fever or headache.   AMIODARONE (PACERONE) 400 MG TABLET    Take 1 tablet (400 mg total) by mouth daily.   APIXABAN (ELIQUIS) 5 MG TABS TABLET    Take 5 mg by mouth 2 (two) times daily.   ASPIRIN 81 MG CHEWABLE TABLET    Chew 1 tablet (81 mg total) by mouth daily.   B COMPLEX-VITAMIN C-FOLIC ACID (NEPHRO-VITE) 0.8 MG TABS TABLET    Take 1 tablet by mouth 2 (two) times daily.   CAPSAICIN (ZOSTRIX) 0.025 % CREAM  Apply 1 application topically 2 (two) times daily as needed (for lower back pain).   IPRATROPIUM-ALBUTEROL (DUONEB) 0.5-2.5 (3) MG/3ML SOLN    Take 3 mLs by nebulization every 6 (six) hours as needed (for wheezing/shortness of breath).   LORATADINE (CLARITIN) 10 MG TABLET    Take 1 tablet (10 mg total) by mouth daily.   METOPROLOL TARTRATE (LOPRESSOR) 25 MG TABLET    Take 1 tablet (25 mg total) by mouth 2 (two) times daily.   MIRTAZAPINE (REMERON) 15 MG TABLET    Take 15 mg by mouth at bedtime.   POLYETHYLENE GLYCOL (MIRALAX / GLYCOLAX) PACKET    Take 17 g by mouth daily as needed for mild constipation.    SEVELAMER (RENAGEL) 800 MG TABLET    Take 2,400 mg by mouth 3 (three) times daily with meals.   TRAVOPROST, BAK FREE, (TRAVATAN) 0.004 % SOLN OPHTHALMIC  SOLUTION    Place 1 drop into both eyes at bedtime.  Modified Medications   No medications on file  Discontinued Medications   EMOLLIENT (EUCERIN) LOTION    Apply topically. Apply to dry areas two times daily   LAMOTRIGINE (LAMICTAL) 100 MG TABLET    Take 100 mg by mouth daily.   SEVELAMER CARBONATE (RENVELA) 800 MG TABLET    Take 2,400 mg by mouth 3 (three) times daily with meals.     SIGNIFICANT DIAGNOSTIC EXAMS  04-28-15: 2-d echo: Left ventricle: The cavity size was normal. Wall thickness was increased in a pattern of mild LVH. The estimated ejection fraction was 50%. Diffuse hypokinesis. Indeterminant diastolic function (atrial fibrillation). - Aortic valve: There was no stenosis. There was mild regurgitation. - Mitral valve: Mildly calcified annulus. There was mild regurgitation. - Left atrium: The atrium was moderately dilated. - Right ventricle: Poorly visualized. The cavity size was mildly dilated. Systolic function was mildly reduced. - Right atrium: The atrium was mildly dilated. - Tricuspid valve: Peak RV-RA gradient (S): 29 mm Hg. - Pulmonary arteries: PA peak pressure: 44 mm Hg (S). - Systemic veins: IVC measured 2.6 with < 50% respirophasic variation, suggesting RA pressure 15 mmHg.  05-08-15: lumbar spine x-ray: No acute findings. Mild spondylosis of the lumbar spine with disc disease at the L5-S1 level. Moderate degenerative change of the hips.  05-12-15: chest x-ray: Cardiomegaly with mild interstitial pulmonary edema.   05-30-15: chest x-ray: Left lower lung field atelectasis versus pneumonia. Clinical correlation and follow-up recommended.   05-31-15: ct of chest: 1. 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 2. Soft tissue swelling in the LEFT chest wall likely related to venous occlusion. There is prominent adenopathy in LEFT axilla and sub pectoralis location. Presumably this relates to venous occlusion. Cannot exclude a malignant process but is felt less likely.  The adenopathy and swelling is new from 08/15/2014 3. Central venous line from a inferior approach with tip is in the RIGHT atrium and distal SVC. 4. Diffuse ground-glass opacities not changed from prior. 5. Resolution of LEFT upper lobe pulmonary nodule.  Ureteral   06-02-15: left upper arm shuntogram: Status post left upper extremity fistulagram demonstrating occluded stent in the subclavian vein at the level of the left clavicular head, as a cause for increased venous pressure within the left upper extremity.    LABS REVIEWED:   04-28-15: hgb a1c 5.7 05-12-15: wbc 6.7 ;hgb 10.4; hct 32.1; mcv 85.1; plt 205; glucose 142; bun 90; creat 14.78; k+4.1; na++141; liver normal albumin 3.7 05-13-15: glucose 79; bun 48; creat 9.18; k+4.3;  na++139; ca++8.6; phos 5.4; albumin 3.4 05-15-15; glucose 118; bun 68; creat 11.91; k+3.6; na++143; phos 7.6; allbumin 3.2 05-23-15: wbc 4.5 ;hgb 10.7; hct 32.6; mcv 84.5; plt 146; glucose 159; bun 73; creat 9.62; k+4.6; na++134; phos 6.0; albumin 3.1  05-30-15: wbc 5.1; hgb 9.1; hct 28.9; mcv 84.5; plt 154; glucose 85 bun 95; creat 13.13; k+ 6.0; na++139; mag 2.2; ferritin 406; iron 29; tibc 209; HIV: nonreactive 06-02-15: wbc 5.9; hgb 9.6; hct 31.0; mcv 86.4; plt 147; glucose 87; bun 35; creat 7.64; k+ 3.9; na++136; phos 4.7; albumin 3.4; hep C AB>11.0; hep B Ag: neg      Review of Systems Constitutional: Negative for appetite change and fatigue.  Respiratory: Negative for cough, chest tightness and shortness of breath.   Cardiovascular: Negative for chest pain and leg swelling.  Gastrointestinal: Negative for abdominal pain and constipation.  Musculoskeletal: Negative for myalgias and arthralgias.  Skin: Negative for pallor.  Psychiatric/Behavioral: The patient is not nervous/anxious.     Physical Exam Vitals reviewed. Constitutional: No distress.  Overweight   Eyes: Conjunctivae are normal.  Neck: Neck supple. No JVD present. No thyromegaly present.   Cardiovascular: Normal rate, regular rhythm and intact distal pulses.   Respiratory: Effort normal and breath sounds normal. No respiratory distress. He has no wheezes.  GI: Soft. Bowel sounds are normal. He exhibits no distension. There is no tenderness.  Musculoskeletal: He exhibits no edema.  Able to move all extremities   Lymphadenopathy:    He has no cervical adenopathy.  Neurological: He is alert.  Skin: Skin is warm and dry. He is not diaphoretic.  Right femoral dialysis access   Psychiatric: He has a normal mood and affect.       ASSESSMENT/ PLAN:   1. ESRD: on hemodialysis: will continue hemodialysis three days per week ;will continue renagel 2400 mg  with each meals; will continue to monitor his status.   2. 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 Hypertension: will continue lopressor 25 mg twice daily  will monitor   4. Chronic low back pain: will use capsaicin cream to his lower back twice daily as needed will monitor   5. Seasonal allergies: will continue claritin 10 mg daily   6. COPD: will continue duoneb every 6 hours as needed . Will not make changes will monitor  7. Diabetes is presently not on medications; his hgb a1c is 5.7  8. 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 Will not make changes will monitor  9. Depression: will continue remeron 15 mg nightly   10. Constipation: will continue miralax daily as needed   11. Afib: will continue eliquis 5 mg twice daily and will continue amiodarone 400 mg daily for rate control     MD is aware of resident's narcotic use and is in agreement with current plan of care. We will attempt to wean resident as apropriate   Ok Edwards NP [facility]  Contact [phone] Monday through Friday 8am- 5pm  After hours call [phone]

## 2016-05-03 LAB — LIPID PANEL
CHOLESTEROL: 101 mg/dL (ref 0–200)
HDL: 51 mg/dL (ref 35–70)
LDL CALC: 35 mg/dL
Triglycerides: 77 mg/dL (ref 40–160)

## 2016-05-03 LAB — BASIC METABOLIC PANEL
BUN: 98 mg/dL — AB (ref 4–21)
Creatinine: 15.8 mg/dL — AB (ref 0.6–1.3)
Glucose: 140 mg/dL
Potassium: 5 mmol/L (ref 3.4–5.3)
Sodium: 141 mmol/L (ref 137–147)

## 2016-05-03 LAB — TSH: TSH: 16.21 u[IU]/mL — AB (ref 0.41–5.90)

## 2016-05-03 LAB — CBC AND DIFFERENTIAL
HEMATOCRIT: 33 % — AB (ref 41–53)
Hemoglobin: 10.3 g/dL — AB (ref 13.5–17.5)
PLATELETS: 227 10*3/uL (ref 150–399)
WBC: 6.9 10^3/mL

## 2016-05-08 ENCOUNTER — Encounter: Payer: Self-pay | Admitting: Adult Health

## 2016-05-08 ENCOUNTER — Non-Acute Institutional Stay (SKILLED_NURSING_FACILITY): Payer: Medicaid Other | Admitting: Adult Health

## 2016-05-08 DIAGNOSIS — E032 Hypothyroidism due to medicaments and other exogenous substances: Secondary | ICD-10-CM

## 2016-05-08 DIAGNOSIS — E038 Other specified hypothyroidism: Secondary | ICD-10-CM

## 2016-05-08 DIAGNOSIS — I482 Chronic atrial fibrillation, unspecified: Secondary | ICD-10-CM

## 2016-05-08 DIAGNOSIS — T462X1A Poisoning by other antidysrhythmic drugs, accidental (unintentional), initial encounter: Secondary | ICD-10-CM

## 2016-05-08 NOTE — Progress Notes (Signed)
Patient ID: Noah Lewis, male   DOB: 1951-03-19, 65 y.o.   MRN: [id number]   Location:   [facility] Room Number: 215-B Place of Service:  SNF (31)   CODE STATUS: Full Code  Allergies  Allergen Reactions  . Penicillins Other (See Comments)    Reaction:  Unknown     Chief Complaint  Patient presents with  . Acute Visit    Lab review    HPI:  He is on long term amiodarone therapy for his afib. This medication has caused his tsh to elevate at 16.21. He will require medication to treat his thyroid disease. He is not voicing any complaints at this time.    Past Medical History:  Diagnosis Date  . Arthritis   . Asthma   . Cancer ([place])   . COPD (chronic obstructive pulmonary disease) ([place])   . Coronary artery disease   . Diabetes mellitus without complication ([place])   . Hypertension   . Renal disorder     Past Surgical History:  Procedure Laterality Date  . INSERTION OF DIALYSIS CATHETER    . INSERTION OF DIALYSIS CATHETER N/A 05/29/2015   Procedure: INSERTION OF DIALYSIS CATHETER;  Surgeon: Angelia Mould, MD;  Location: [facility];  Service: Vascular;  Laterality: N/A;  . NO PAST SURGERIES    . PERIPHERAL VASCULAR CATHETERIZATION N/A 10/25/2015   Procedure: Dialysis/Perma Catheter Insertion;  Surgeon: Katha Cabal, MD;  Location: [facility];  Service: Cardiovascular;  Laterality: N/A;  . PERIPHERAL VASCULAR CATHETERIZATION Left 02/22/2016   Procedure: A/V Shuntogram/Fistulagram;  Surgeon: Algernon Huxley, MD;  Location: [facility];  Service: Cardiovascular;  Laterality: Left;  . PERIPHERAL VASCULAR CATHETERIZATION Right 03/19/2016   Procedure: Dialysis/Perma Catheter Insertion;  Surgeon: Katha Cabal, MD;  Location: [facility];  Service: Cardiovascular;  Laterality: Right;  . PERIPHERAL VASCULAR CATHETERIZATION N/A 04/22/2016   Procedure: Dialysis/Perma Catheter Removal;  Surgeon: Algernon Huxley, MD;  Location: [facility]  [facility];  Service: Cardiovascular;  Laterality: N/A;    Social History   Social History  . Marital status: Legally Separated    Spouse name: N/A  . Number of children: N/A  . Years of education: N/A   Occupational History  . Not on file.   Social History Main Topics  . Smoking status: Never Smoker  . Smokeless tobacco: Never Used  . Alcohol use No  . Drug use: No  . Sexual activity: Not on file   Other Topics Concern  . Not on file   Social History Narrative  . No narrative on file   Family History  Problem Relation Age of Onset  . Hypertension Other   . Diabetes Other       VITAL SIGNS BP (!) 146/80   Pulse 84   Temp 97.6 F (36.4 C) (Oral)   Resp (!) 22   Ht 5\' 7"  (1.702 m)   Wt 191 lb (86.6 kg)   SpO2 97%   BMI 29.91 kg/m   Patient's Medications  New Prescriptions   No medications on file  Previous Medications   ACETAMINOPHEN (TYLENOL) 325 MG TABLET    Take 650 mg by mouth every 6 (six) hours as needed for mild pain, fever or headache.   AMIODARONE (PACERONE) 400 MG TABLET    Take 1 tablet (400 mg total) by mouth daily.   APIXABAN (ELIQUIS) 5 MG TABS TABLET    Take 1-2 mg by mouth 2 (two) times  daily.    ASPIRIN 81 MG CHEWABLE TABLET    Chew 1 tablet (81 mg total) by mouth daily.   B COMPLEX-VITAMIN C-FOLIC ACID (NEPHRO-VITE) 0.8 MG TABS TABLET    Take 1 tablet by mouth 2 (two) times daily.   CAPSAICIN (ZOSTRIX) 0.025 % CREAM    Apply 1 application topically 2 (two) times daily as needed (for lower back pain).   IPRATROPIUM-ALBUTEROL (DUONEB) 0.5-2.5 (3) MG/3ML SOLN    Take 3 mLs by nebulization every 6 (six) hours as needed (for wheezing/shortness of breath).   LAMOTRIGINE (LAMICTAL) 25 MG TABLET    Take 50 mg by mouth daily.   LORATADINE (CLARITIN) 10 MG TABLET    Take 1 tablet (10 mg total) by mouth daily.   METOPROLOL TARTRATE (LOPRESSOR) 25 MG TABLET    Take 1 tablet (25 mg total) by mouth 2 (two) times daily.   MIRTAZAPINE (REMERON) 15 MG TABLET     Take 15 mg by mouth at bedtime.   POLYETHYLENE GLYCOL (MIRALAX / GLYCOLAX) PACKET    Take 17 g by mouth daily as needed for mild constipation.    SEVELAMER (RENAGEL) 800 MG TABLET    Take 2,400 mg by mouth 3 (three) times daily with meals.   SKIN PROTECTANTS, MISC. (EUCERIN) CREAM    Apply 1 application topically 2 (two) times daily.   TRAVOPROST, BAK FREE, (TRAVATAN) 0.004 % SOLN OPHTHALMIC SOLUTION    Place 1 drop into both eyes at bedtime.  Modified Medications   No medications on file  Discontinued Medications   No medications on file     SIGNIFICANT DIAGNOSTIC EXAMS  04-28-15: 2-d echo: Left ventricle: The cavity size was normal. Wall thickness was increased in a pattern of mild LVH. The estimated ejection fraction was 50%. Diffuse hypokinesis. Indeterminant diastolic function (atrial fibrillation). - Aortic valve: There was no stenosis. There was mild regurgitation. - Mitral valve: Mildly calcified annulus. There was mild regurgitation. - Left atrium: The atrium was moderately dilated. - Right ventricle: Poorly visualized. The cavity size was mildly dilated. Systolic function was mildly reduced. - Right atrium: The atrium was mildly dilated. - Tricuspid valve: Peak RV-RA gradient (S): 29 mm Hg. - Pulmonary arteries: PA peak pressure: 44 mm Hg (S). - Systemic veins: IVC measured 2.6 with < 50% respirophasic variation, suggesting RA pressure 15 mmHg.  05-08-15: lumbar spine x-ray: No acute findings. Mild spondylosis of the lumbar spine with disc disease at the L5-S1 level. Moderate degenerative change of the hips.  05-12-15: chest x-ray: Cardiomegaly with mild interstitial pulmonary edema.   05-30-15: chest x-ray: Left lower lung field atelectasis versus pneumonia. Clinical correlation and follow-up recommended.   05-31-15: ct of chest: 1. 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 2. Soft tissue swelling in the LEFT chest wall likely related to venous occlusion. There is  prominent adenopathy in LEFT axilla and sub pectoralis location. Presumably this relates to venous occlusion. Cannot exclude a malignant process but is felt less likely. The adenopathy and swelling is new from 08/15/2014 3. Central venous line from a inferior approach with tip is in the RIGHT atrium and distal SVC. 4. Diffuse ground-glass opacities not changed from prior. 5. Resolution of LEFT upper lobe pulmonary nodule.  Ureteral   06-02-15: left upper arm shuntogram: Status post left upper extremity fistulagram demonstrating occluded stent in the subclavian vein at the level of the left clavicular head, as a cause for increased venous pressure within the left upper extremity.  LABS REVIEWED:   05-30-15: wbc 5.1; hgb 9.1; hct 28.9; mcv 84.5; plt 154; glucose 85 bun 95; creat 13.13; k+ 6.0; na++139; mag 2.2; ferritin 406; iron 29; tibc 209; HIV: nonreactive 06-02-15: wbc 5.9; hgb 9.6; hct 31.0; mcv 86.4; plt 147; glucose 87; bun 35; creat 7.64; k+ 3.9; na++136; phos 4.7; albumin 3.4; hep C AB>11.0; hep B Ag: neg 05-03-16: wbc 6.9; hgb 10.3; ht 33.2; mcv 90.7 plt 227; glucose 140; bun 98.3; creat 15.76; k+ 5.0; na++ 141; liver normal albumin 3.9; chol 101; ldl 35; trig 77; hdl 51; tsh 16.21; mag 2.4; phos 7.0      Review of Systems Constitutional: Negative for appetite change and fatigue.  Respiratory: Negative for cough, chest tightness and shortness of breath.   Cardiovascular: Negative for chest pain and leg swelling.  Gastrointestinal: Negative for abdominal pain and constipation.  Musculoskeletal: Negative for myalgias and arthralgias.  Skin: Negative for pallor.  Psychiatric/Behavioral: The patient is not nervous/anxious.     Physical Exam Vitals reviewed. Constitutional: No distress.  Overweight   Eyes: Conjunctivae are normal.  Neck: Neck supple. No JVD present. No thyromegaly present.  Cardiovascular: Normal rate, regular rhythm and intact distal pulses.   Respiratory:  Effort normal and breath sounds normal. No respiratory distress. He has no wheezes.  GI: Soft. Bowel sounds are normal. He exhibits no distension. There is no tenderness.  Musculoskeletal: He exhibits no edema.  Able to move all extremities   Lymphadenopathy:    He has no cervical adenopathy.  Neurological: He is alert.  Skin: Skin is warm and dry. He is not diaphoretic.  Right femoral dialysis access   Psychiatric: He has a normal mood and affect.       ASSESSMENT/ PLAN:  1. Afib: will continue eliquis 5 mg twice daily and will continue amiodarone 400 mg daily for rate control   2. 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     MD is aware of resident's narcotic use and is in agreement with current plan of care. We will attempt to wean resident as apropriate   Ok Edwards NP [facility]  Contact [phone] Monday through Friday 8am- 5pm  After hours call [phone]

## 2016-05-20 ENCOUNTER — Encounter: Payer: Self-pay | Admitting: Internal Medicine

## 2016-05-20 ENCOUNTER — Non-Acute Institutional Stay (SKILLED_NURSING_FACILITY): Payer: Medicaid Other | Admitting: Internal Medicine

## 2016-05-20 DIAGNOSIS — Z992 Dependence on renal dialysis: Secondary | ICD-10-CM | POA: Diagnosis not present

## 2016-05-20 DIAGNOSIS — R0681 Apnea, not elsewhere classified: Secondary | ICD-10-CM

## 2016-05-20 DIAGNOSIS — N186 End stage renal disease: Secondary | ICD-10-CM | POA: Diagnosis not present

## 2016-05-20 DIAGNOSIS — J449 Chronic obstructive pulmonary disease, unspecified: Secondary | ICD-10-CM | POA: Diagnosis not present

## 2016-05-20 DIAGNOSIS — I480 Paroxysmal atrial fibrillation: Secondary | ICD-10-CM | POA: Diagnosis not present

## 2016-05-20 DIAGNOSIS — G4719 Other hypersomnia: Secondary | ICD-10-CM

## 2016-05-20 NOTE — Progress Notes (Signed)
Patient ID: Noah Lewis, male   DOB: 09/25/1950, 65 y.o.   MRN: [id number]    DATE: 05/20/2016  Location:    [place] Room Number: 215 B Place of Service: SNF (31)   Extended Emergency Contact Information Primary Emergency Contact: Hayes,Barbara Address: [place], [place] [address] [place] Phone: [phone] Relation: Mother Secondary Emergency Contact: Hayes,[name] Address: [address]          [place], [place] [address] [place] Phone: [phone] Relation: Brother  Advanced Directive information Does patient have an advance directive?: Yes, Type of Advance Directive: Out of facility DNR (pink MOST or yellow form), Does patient want to make changes to advanced directive?: No - Patient declined  Chief Complaint  Patient presents with  . Acute Visit    Sleep apnea    HPI:  65 yo male long term resident seen today for witnessed apnea. Denies formal dx of sleep apnea. Nursing reports intermittent witnessed apnea lasting several seconds while sleeping. He notes excessive daytime sleepiness. He missed HD over last weekend. He has severe back pain. No recent falls. he has hx COPD/asthma and ESRD/HD. Pt denies new SOB or CP. No f/c.  Past Medical History:  Diagnosis Date  . Arthritis   . Asthma   . Cancer ([place])   . COPD (chronic obstructive pulmonary disease) ([place])   . Coronary artery disease   . Diabetes mellitus without complication ([place])   . Hypertension   . Renal disorder     Past Surgical History:  Procedure Laterality Date  . INSERTION OF DIALYSIS CATHETER    . INSERTION OF DIALYSIS CATHETER N/A 05/29/2015   Procedure: INSERTION OF DIALYSIS CATHETER;  Surgeon: Angelia Mould, MD;  Location: [facility];  Service: Vascular;  Laterality: N/A;  . NO PAST SURGERIES    . PERIPHERAL VASCULAR CATHETERIZATION N/A 10/25/2015   Procedure: Dialysis/Perma Catheter Insertion;  Surgeon: Katha Cabal, MD;  Location: [facility];  Service: Cardiovascular;  Laterality: N/A;  . PERIPHERAL VASCULAR CATHETERIZATION Left 02/22/2016   Procedure: A/V Shuntogram/Fistulagram;  Surgeon: Algernon Huxley, MD;  Location: [facility];  Service: Cardiovascular;  Laterality: Left;  . PERIPHERAL VASCULAR CATHETERIZATION Right 03/19/2016   Procedure: Dialysis/Perma Catheter Insertion;  Surgeon: Katha Cabal, MD;  Location: [facility];  Service: Cardiovascular;  Laterality: Right;  . PERIPHERAL VASCULAR CATHETERIZATION N/A 04/22/2016   Procedure: Dialysis/Perma Catheter Removal;  Surgeon: Algernon Huxley, MD;  Location: [facility];  Service: Cardiovascular;  Laterality: N/A;    Patient Care Team: Theotis Burrow, MD as PCP - General (Family Medicine)  Social History   Social History  . Marital status: Legally Separated    Spouse name: N/A  . Number of children: N/A  . Years of education: N/A   Occupational History  . Not on file.   Social History Main Topics  . Smoking status: Never Smoker  . Smokeless tobacco: Never Used  . Alcohol use No  . Drug use: No  . Sexual activity: Not on file   Other Topics Concern  . Not on file   Social History Narrative  . No narrative on file     reports that he has never smoked. He has never used smokeless tobacco. He reports that he does not drink alcohol or use drugs.  Family History  Problem Relation  Age of Onset  . Hypertension Other   . Diabetes Other    Family Status  Relation Status  . Mother Alive  . Father Deceased  . Other     Immunization History  Administered Date(s) Administered  . PPD Test 05/01/2016, 05/01/2016, 05/01/2016    Allergies  Allergen Reactions  . Penicillins Other (See Comments)    Reaction:  Unknown     Medications: Patient's Medications  New Prescriptions   No medications on file  Previous Medications   ACETAMINOPHEN (TYLENOL) 325 MG TABLET    Take 650 mg by  mouth every 6 (six) hours as needed for mild pain, fever or headache.   AMIODARONE (PACERONE) 400 MG TABLET    Take 1 tablet (400 mg total) by mouth daily.   APIXABAN (ELIQUIS) 5 MG TABS TABLET    Take 5 mg by mouth 2 (two) times daily.    ASPIRIN 81 MG CHEWABLE TABLET    Chew 1 tablet (81 mg total) by mouth daily.   B COMPLEX-VITAMIN C-FOLIC ACID (NEPHRO-VITE) 0.8 MG TABS TABLET    Take 1 tablet by mouth 2 (two) times daily.   CAPSAICIN (ZOSTRIX) 0.025 % CREAM    Apply 1 application topically 2 (two) times daily as needed (for lower back pain).   IPRATROPIUM-ALBUTEROL (DUONEB) 0.5-2.5 (3) MG/3ML SOLN    Take 3 mLs by nebulization every 6 (six) hours as needed (for wheezing/shortness of breath).   LAMOTRIGINE (LAMICTAL) 25 MG TABLET    Take 50 mg by mouth daily.   LEVOTHYROXINE (SYNTHROID, LEVOTHROID) 75 MCG TABLET    Take 75 mcg by mouth daily before breakfast.    LORATADINE (CLARITIN) 10 MG TABLET    Take 1 tablet (10 mg total) by mouth daily.   METOPROLOL TARTRATE (LOPRESSOR) 25 MG TABLET    Take 1 tablet (25 mg total) by mouth 2 (two) times daily.   MIRTAZAPINE (REMERON) 15 MG TABLET    Take 15 mg by mouth at bedtime.   POLYETHYLENE GLYCOL (MIRALAX / GLYCOLAX) PACKET    Take 17 g by mouth daily as needed for mild constipation.    SEVELAMER (RENAGEL) 800 MG TABLET    Take 2,400 mg by mouth 3 (three) times daily with meals.   TRAVOPROST, BAK FREE, (TRAVATAN) 0.004 % SOLN OPHTHALMIC SOLUTION    Place 1 drop into both eyes at bedtime.  Modified Medications   No medications on file  Discontinued Medications   SKIN PROTECTANTS, MISC. (EUCERIN) CREAM    Apply 1 application topically 2 (two) times daily.    Review of Systems  Musculoskeletal: Positive for arthralgias.  Psychiatric/Behavioral: Positive for sleep disturbance.  All other systems reviewed and are negative.  Vitals:   05/20/16 1142  BP: (!) 146/80  Pulse: 84  Resp: (!) 22  Temp: 97.6 F (36.4 C)  TempSrc: Oral  Weight: 198  lb (89.8 kg)  Height: _0  (1.702 m)      Physical Exam  Constitutional: He is oriented to person, place, and time. He appears well-developed.  Sitting in w/c in NAD  Cardiovascular: Normal rate, regular rhythm and intact distal pulses.  Exam reveals no gallop and no friction rub.   Murmur (1/6 SEM) heard. +1 pitting LE edema. No calf TTP  Pulmonary/Chest:  Reduced BS at base b/l.   Musculoskeletal: He exhibits edema and tenderness.  Neurological: He is alert and oriented to person, place, and time.  Skin: Skin is warm and dry. No rash noted.  Psychiatric: He  has a normal mood and affect. His behavior is normal. Thought content normal.     Labs reviewed: Nursing Home on 05/20/2016  Component Date Value Ref Range Status  . Hemoglobin 05/03/2016 10.3* 13.5 - 17.5 g/dL Final  . HCT 05/03/2016 33* 41 - 53 % Final  . Platelets 05/03/2016 227  150 - 399 K/L Final  . WBC 05/03/2016 6.9  10^3/mL Final  . Glucose 05/03/2016 140  mg/dL Final  . BUN 05/03/2016 98* 4 - 21 mg/dL Final  . Creatinine 05/03/2016 15.8* 0.6 - 1.3 mg/dL Final  . Potassium 05/03/2016 5.0  3.4 - 5.3 mmol/L Final  . Sodium 05/03/2016 141  137 - 147 mmol/L Final  . Triglycerides 05/03/2016 77  40 - 160 mg/dL Final  . Cholesterol 05/03/2016 101  0 - 200 mg/dL Final  . HDL 05/03/2016 51  35 - 70 mg/dL Final  . LDL Cholesterol 05/03/2016 35  mg/dL Final  . TSH 05/03/2016 16.21* 0.41 - 5.90 uIU/mL Final  Admission on 03/16/2016, Discharged on 03/17/2016  Component Date Value Ref Range Status  . WBC 03/17/2016 8.2  3.8 - 10.6 K/uL Final  . RBC 03/17/2016 3.67* 4.40 - 5.90 MIL/uL Final  . Hemoglobin 03/17/2016 10.7* 13.0 - 18.0 g/dL Final  . HCT 03/17/2016 33.1* 40.0 - 52.0 % Final  . MCV 03/17/2016 90.1  80.0 - 100.0 fL Final  . MCH 03/17/2016 29.2  26.0 - 34.0 pg Final  . MCHC 03/17/2016 32.5  32.0 - 36.0 g/dL Final  . RDW 03/17/2016 17.7* 11.5 - 14.5 % Final  . Platelets 03/17/2016 217  150 - 440 K/uL Final    . ABO/RH(D) 03/17/2016 O POS   Final  . Antibody Screen 03/17/2016 NEG   Final  . Sample Expiration 03/17/2016 03/19/2016   Final  . Sodium 03/17/2016 141  135 - 145 mmol/L Final  . Potassium 03/17/2016 4.8  3.5 - 5.1 mmol/L Final  . Chloride 03/17/2016 104  101 - 111 mmol/L Final  . CO2 03/17/2016 21* 22 - 32 mmol/L Final  . Glucose, Bld 03/17/2016 117* 65 - 99 mg/dL Final  . BUN 03/17/2016 88* 6 - 20 mg/dL Final  . Creatinine, Ser 03/17/2016 14.43* 0.61 - 1.24 mg/dL Final  . Calcium 03/17/2016 8.5* 8.9 - 10.3 mg/dL Final  . Total Protein 03/17/2016 7.4  6.5 - 8.1 g/dL Final  . Albumin 03/17/2016 3.7  3.5 - 5.0 g/dL Final  . AST 03/17/2016 33  15 - 41 U/L Final  . ALT 03/17/2016 34  17 - 63 U/L Final  . Alkaline Phosphatase 03/17/2016 127* 38 - 126 U/L Final  . Total Bilirubin 03/17/2016 0.1* 0.3 - 1.2 mg/dL Final  . GFR calc non Af Amer 03/17/2016 3* >60 mL/min Final  . GFR calc Af Amer 03/17/2016 4* >60 mL/min Final   Comment: (NOTE) The eGFR has been calculated using the CKD EPI equation. This calculation has not been validated in all clinical situations. eGFR's persistently <60 mL/min signify possible Chronic Kidney Disease.   . Anion gap 03/17/2016 16* 5 - 15 Final  . Lipase 03/17/2016 39  11 - 51 U/L Final  . Lactic Acid, Venous 03/17/2016 1.7  0.5 - 2.0 mmol/L Final  . Prothrombin Time 03/17/2016 16.9* 11.4 - 15.0 seconds Final  . INR 03/17/2016 1.36   Final  . aPTT 03/17/2016 36  24 - 36 seconds Final  Admission on 02/22/2016, Discharged on 02/22/2016  Component Date Value Ref Range Status  . Potassium [facility]  vascular lab) 02/22/2016 6.5* 3.5 - 5.1 Final   Comment: CRITICAL RESULT CALLED TO, READ BACK BY AND VERIFIED WITH:  CALLED TO RN ANGIE SHAMBLY 1058, 02/22/2016, LS      Assessment/Plan     ICD-9-CM ICD-10-CM   1. Apnea 786.03 R06.81    witnessed  2. Excessive daytime sleepiness 780.54 G47.19   3. PAF (paroxysmal atrial fibrillation) (HCC) 427.31 I48.0   4.  COPD, moderate (HCC) 496 J44.9   5. ESRD on hemodialysis ([place]) - noncompliant 585.6 N18.6    V45.11 Z99.2    Get sleep study, split night to further assess sleep apnea  cont other meds as ordered  F/u with specialists as scheduled  Cont HD as scheduled  Will follow  Olukemi Panchal S. Perlie Gold  [facility] and Adult Medicine [address] [place], [place] [address] [phone] Cell (Monday-Friday 8 AM - 5 PM) [phone] After 5 PM and follow prompts

## 2016-05-27 ENCOUNTER — Encounter (HOSPITAL_COMMUNITY): Payer: Self-pay | Admitting: Neurology

## 2016-05-27 ENCOUNTER — Emergency Department (HOSPITAL_COMMUNITY): Payer: Medicaid Other

## 2016-05-27 ENCOUNTER — Non-Acute Institutional Stay (HOSPITAL_COMMUNITY)
Admission: EM | Admit: 2016-05-27 | Discharge: 2016-05-27 | Disposition: A | Discharge status: Home / Self Care | Payer: Medicaid Other | Attending: Emergency Medicine | Admitting: Emergency Medicine

## 2016-05-27 DIAGNOSIS — Z7901 Long term (current) use of anticoagulants: Secondary | ICD-10-CM | POA: Insufficient documentation

## 2016-05-27 DIAGNOSIS — I482 Chronic atrial fibrillation: Secondary | ICD-10-CM | POA: Diagnosis not present

## 2016-05-27 DIAGNOSIS — G8929 Other chronic pain: Secondary | ICD-10-CM | POA: Insufficient documentation

## 2016-05-27 DIAGNOSIS — I2510 Atherosclerotic heart disease of native coronary artery without angina pectoris: Secondary | ICD-10-CM | POA: Diagnosis not present

## 2016-05-27 DIAGNOSIS — I120 Hypertensive chronic kidney disease with stage 5 chronic kidney disease or end stage renal disease: Secondary | ICD-10-CM | POA: Insufficient documentation

## 2016-05-27 DIAGNOSIS — Z880 Allergy status to penicillin: Secondary | ICD-10-CM | POA: Diagnosis not present

## 2016-05-27 DIAGNOSIS — J449 Chronic obstructive pulmonary disease, unspecified: Secondary | ICD-10-CM | POA: Diagnosis not present

## 2016-05-27 DIAGNOSIS — Z992 Dependence on renal dialysis: Secondary | ICD-10-CM | POA: Diagnosis not present

## 2016-05-27 DIAGNOSIS — J810 Acute pulmonary edema: Secondary | ICD-10-CM | POA: Diagnosis not present

## 2016-05-27 DIAGNOSIS — Z9115 Patient's noncompliance with renal dialysis: Secondary | ICD-10-CM | POA: Insufficient documentation

## 2016-05-27 DIAGNOSIS — M1990 Unspecified osteoarthritis, unspecified site: Secondary | ICD-10-CM | POA: Insufficient documentation

## 2016-05-27 DIAGNOSIS — J9690 Respiratory failure, unspecified, unspecified whether with hypoxia or hypercapnia: Secondary | ICD-10-CM | POA: Diagnosis not present

## 2016-05-27 DIAGNOSIS — F1290 Cannabis use, unspecified, uncomplicated: Secondary | ICD-10-CM | POA: Diagnosis not present

## 2016-05-27 DIAGNOSIS — Z7982 Long term (current) use of aspirin: Secondary | ICD-10-CM | POA: Insufficient documentation

## 2016-05-27 DIAGNOSIS — E1122 Type 2 diabetes mellitus with diabetic chronic kidney disease: Secondary | ICD-10-CM | POA: Diagnosis not present

## 2016-05-27 DIAGNOSIS — Z86718 Personal history of other venous thrombosis and embolism: Secondary | ICD-10-CM | POA: Insufficient documentation

## 2016-05-27 DIAGNOSIS — F319 Bipolar disorder, unspecified: Secondary | ICD-10-CM | POA: Diagnosis not present

## 2016-05-27 DIAGNOSIS — N186 End stage renal disease: Secondary | ICD-10-CM | POA: Insufficient documentation

## 2016-05-27 DIAGNOSIS — G4733 Obstructive sleep apnea (adult) (pediatric): Secondary | ICD-10-CM | POA: Diagnosis not present

## 2016-05-27 DIAGNOSIS — R0602 Shortness of breath: Secondary | ICD-10-CM | POA: Diagnosis present

## 2016-05-27 DIAGNOSIS — J811 Chronic pulmonary edema: Secondary | ICD-10-CM | POA: Diagnosis present

## 2016-05-27 LAB — I-STAT CHEM 8, ED
BUN: 58 mg/dL — AB (ref 6–20)
CHLORIDE: 99 mmol/L — AB (ref 101–111)
CREATININE: 16.5 mg/dL — AB (ref 0.61–1.24)
Calcium, Ion: 0.84 mmol/L — CL (ref 1.15–1.40)
GLUCOSE: 85 mg/dL (ref 65–99)
HCT: 35 % — ABNORMAL LOW (ref 39.0–52.0)
Hemoglobin: 11.9 g/dL — ABNORMAL LOW (ref 13.0–17.0)
POTASSIUM: 4.6 mmol/L (ref 3.5–5.1)
Sodium: 139 mmol/L (ref 135–145)
TCO2: 27 mmol/L (ref 0–100)

## 2016-05-27 MED ORDER — PENTAFLUOROPROP-TETRAFLUOROETH EX AERO: 1.0000 "application " | INHALATION_SPRAY | CUTANEOUS | Status: DC | PRN

## 2016-05-27 MED ORDER — SODIUM CHLORIDE 0.9 % IV SOLN
100.0000 mL | INTRAVENOUS | Status: DC | PRN
Start: 2016-05-27 — End: 2016-05-27

## 2016-05-27 MED ORDER — LIDOCAINE HCL (PF) 1 % IJ SOLN: 5.0000 mL | INTRAMUSCULAR | Status: DC | PRN

## 2016-05-27 MED ORDER — HEPARIN SODIUM (PORCINE) 1000 UNIT/ML DIALYSIS: 5000.0000 [IU] | Freq: Once | INTRAMUSCULAR | Status: DC

## 2016-05-27 MED ORDER — ALTEPLASE 2 MG IJ SOLR: 2.0000 mg | Freq: Once | INTRAMUSCULAR | Status: DC | PRN

## 2016-05-27 MED ORDER — SODIUM CHLORIDE 0.9 % IV SOLN: 100.0000 mL | INTRAVENOUS | Status: DC | PRN

## 2016-05-27 MED ORDER — HEPARIN SODIUM (PORCINE) 1000 UNIT/ML DIALYSIS: 1000.0000 [IU] | INTRAMUSCULAR | Status: DC | PRN

## 2016-05-27 MED ORDER — LIDOCAINE-PRILOCAINE 2.5-2.5 % EX CREA
1.0000 | TOPICAL_CREAM | CUTANEOUS | Status: DC | PRN
Start: 2016-05-27 — End: 2016-05-27

## 2016-05-27 MED ORDER — LIDOCAINE-PRILOCAINE 2.5-2.5 % EX CREA: 1.0000 "application " | TOPICAL_CREAM | CUTANEOUS | Status: DC | PRN

## 2016-05-27 MED ORDER — HEPARIN SODIUM (PORCINE) 1000 UNIT/ML DIALYSIS
1000.0000 [IU] | INTRAMUSCULAR | Status: DC | PRN
Start: 2016-05-27 — End: 2016-05-27

## 2016-05-27 NOTE — ED Notes (Signed)
Pt off bipap on 4 L [place], 100% but was SOB and sounded congested. Placed back on bipap. Pt asked to make phone call, attempt to call his mother but no answer.

## 2016-05-27 NOTE — Consult Note (Signed)
Renal Service Consult Note [place] [facility] 05/27/2016 [place] Requesting Physician:  Dr Winfred Leeds  Reason for Consult:  SOB , ESRD pt HPI: The patient is a 65 y.o. year-old presenting with SOB onset last night.  Missed HD on Sat , last HD was 4 days ago.  Fire dept found pt hypoxic and dyspneic, placed CPAP, now on BIpap in ED.  Feeling better.  Denies fevers, chest pain or prod cough recently.  No abd pain, n/v/d.    On HD x 5 years, was [facility] then transferred to [place] in 2016.  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  Has LUA AVF, no cath at this time, gets access procedures done by Dr Lucky Cowboy in [place]. Currently lives in [place].     ROS  denies CP  no joint pain   no HA  no blurry vision  no rash  no diarrhea  no nausea/ vomiting  no dysuria  no difficulty voiding  no change in urine color    Past Medical History  Past Medical History:  Diagnosis Date  . Arthritis   . Asthma   . Cancer ([place])   . COPD (chronic obstructive pulmonary disease) ([place])   . Coronary artery disease   . Diabetes mellitus without complication ([place])   . Hypertension   . Renal disorder    Past Surgical History  Past Surgical History:  Procedure Laterality Date  . INSERTION OF DIALYSIS CATHETER    . INSERTION OF DIALYSIS CATHETER N/A 05/29/2015   Procedure: INSERTION OF DIALYSIS CATHETER;  Surgeon: Angelia Mould, MD;  Location: [facility];  Service: Vascular;  Laterality: N/A;  . NO PAST SURGERIES    . PERIPHERAL VASCULAR CATHETERIZATION N/A 10/25/2015   Procedure: Dialysis/Perma Catheter Insertion;  Surgeon: Katha Cabal, MD;  Location: [facility];  Service: Cardiovascular;  Laterality: N/A;  . PERIPHERAL VASCULAR CATHETERIZATION Left 02/22/2016   Procedure: A/V Shuntogram/Fistulagram;  Surgeon: Algernon Huxley, MD;  Location: [facility];  Service: Cardiovascular;  Laterality: Left;  .  PERIPHERAL VASCULAR CATHETERIZATION Right 03/19/2016   Procedure: Dialysis/Perma Catheter Insertion;  Surgeon: Katha Cabal, MD;  Location: [facility];  Service: Cardiovascular;  Laterality: Right;  . PERIPHERAL VASCULAR CATHETERIZATION N/A 04/22/2016   Procedure: Dialysis/Perma Catheter Removal;  Surgeon: Algernon Huxley, MD;  Location: [facility];  Service: Cardiovascular;  Laterality: N/A;   Family History  Family History  Problem Relation Age of Onset  . Hypertension Other   . Diabetes Other    Social History  reports that he has never smoked. He has never used smokeless tobacco. He reports that he does not drink alcohol or use drugs. Allergies  Allergies  Allergen Reactions  . Penicillins Other (See Comments)    Reaction:  Unknown    Home medications Prior to Admission medications   Medication Sig Start Date End Date Taking? Authorizing Provider  acetaminophen (TYLENOL) 325 MG tablet Take 650 mg by mouth every 6 (six) hours as needed for mild pain, fever or headache.    Historical Provider, MD  amiodarone (PACERONE) 400 MG tablet Take 1 tablet (400 mg total) by mouth daily. 12/14/15   Hillary Bow, MD  apixaban (ELIQUIS) 5 MG TABS tablet Take 1-2 mg by mouth 2 (two) times daily.     Historical Provider, MD  aspirin 81 MG chewable tablet Chew 1 tablet (81 mg total)  by mouth daily. 06/02/15   Maryellen Pile, MD  b complex-vitamin c-folic acid (NEPHRO-VITE) 0.8 MG TABS tablet Take 1 tablet by mouth 2 (two) times daily.    Historical Provider, MD  capsaicin (ZOSTRIX) 0.025 % cream Apply 1 application topically 2 (two) times daily as needed (for lower back pain).    Historical Provider, MD  ipratropium-albuterol (DUONEB) 0.5-2.5 (3) MG/3ML SOLN Take 3 mLs by nebulization every 6 (six) hours as needed (for wheezing/shortness of breath).    Historical Provider, MD  lamoTRIgine (LAMICTAL) 25 MG tablet Take 50 mg by mouth daily.    Historical Provider, MD  levothyroxine  (SYNTHROID, LEVOTHROID) 50 MCG tablet Take 50 mcg by mouth daily before breakfast.    Historical Provider, MD  loratadine (CLARITIN) 10 MG tablet Take 1 tablet (10 mg total) by mouth daily. 05/24/15   Gerlene Fee, NP  metoprolol tartrate (LOPRESSOR) 25 MG tablet Take 1 tablet (25 mg total) by mouth 2 (two) times daily. 12/14/15   Srikar Sudini, MD  mirtazapine (REMERON) 15 MG tablet Take 15 mg by mouth at bedtime.    Historical Provider, MD  polyethylene glycol (MIRALAX / GLYCOLAX) packet Take 17 g by mouth daily as needed for mild constipation.     Historical Provider, MD  sevelamer (RENAGEL) 800 MG tablet Take 2,400 mg by mouth 3 (three) times daily with meals.    Historical Provider, MD  Skin Protectants, Misc. (EUCERIN) cream Apply 1 application topically 2 (two) times daily.    Historical Provider, MD  Travoprost, BAK Free, (TRAVATAN) 0.004 % SOLN ophthalmic solution Place 1 drop into both eyes at bedtime.    Historical Provider, MD   Liver Function Tests No results for input(s): AST, ALT, ALKPHOS, BILITOT, PROT, ALBUMIN in the last 168 hours. No results for input(s): LIPASE, AMYLASE in the last 168 hours. CBC  Recent Labs Lab 05/27/16 1117  HGB 11.9*  HCT [id number]*   Basic Metabolic Panel  Recent Labs Lab 05/27/16 1117  NA 139  K 4.6  CL 99*  GLUCOSE 85  BUN 58*  CREATININE 16.50*   Iron/TIBC/Ferritin/ %Sat    Component Value Date/Time   IRON 29 (L) 05/30/2015 0936   TIBC 290 05/30/2015 0936   FERRITIN 406 (H) 05/30/2015 0936   IRONPCTSAT 10 (L) 05/30/2015 0936    Vitals:   05/27/16 1055 05/27/16 1100 05/27/16 1110 05/27/16 1115  BP:   150/90 (!) 155/120  Pulse: 79  84 84  Resp: (!) 28  18 19   SpO2: 100% 100% 100% 100%   Exam Gen alert, on bipap, not in distress No rash, cyanosis or gangrene Sclera anicteric, throat not seen  No jvd or bruits, +facial and neck swelling Chest clear bilat no wheezing RRR no RG, +2/6 sem Abd soft ntnd no mass or ascites +bs ,  rounded obesity GU normal male MS no joint effusions or deformity Ext no LE edema / no  wounds or ulcers Neuro is alert, Ox 3 , nf LUA AVF two areas of eschar distal AVF, tender but no drainage / erythema/ fluctuance L arm gen'd edema w chronic skin darkening lower arm    Dialysis: [facility]   2/2 bath   84kg  Hep 5000  LUA AVF   Assessment: 1.  Resp distress/ pulm edema - HD asap upstairs 2.  ESRD usual HD TTS 3.  Vol overload 4.  Hx SVC stenosis/ L innom occlusion/ chron facial edema 5.  L AVF wounds - has eschar  distal AVF x 2 6.  Bipolar d/o - on remeron/ Lamictal 7.  Chron afib - amio and apixaban    Plan - as above  Kelly Splinter MD [facility] pager [phone]    cell [phone] 05/27/2016, 11:27 AM

## 2016-05-27 NOTE — ED Provider Notes (Addendum)
[facility] DEPT Provider Note   CSN: [id number] Arrival date & time: 05/27/16  1054     History   Chief Complaint Chief Complaint  Patient presents with  . Shortness of Breath    HPI Noah Lewis is a 65 y.o. male.L5 caveat acute situation complains of shortness of breath gradual onset last night. Denies other complaint. Treated by EMS with cpap Prior to arrival. His last hemodialysis session was 4 days ago. Denies chest pain denies fever denies cough   HPI  Past Medical History:  Diagnosis Date  . Arthritis   . Asthma   . Cancer ([place])   . COPD (chronic obstructive pulmonary disease) ([place])   . Coronary artery disease   . Diabetes mellitus without complication ([place])   . Hypertension   . Renal disorder   End-stage renal disease  Patient Active Problem List   Diagnosis Date Noted  . Back pain   . Bacteremia   . Chronic a-fib ([place]) 12/04/2015  . Hypertensive urgency 09/20/2015  . 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 Anemia of chronic kidney failure   . Left arm swelling   . End-stage renal disease needing dialysis ([place]) 05/29/2015  . 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 COPD, moderate ([place]) 05/24/2015  . OSA (obstructive sleep apnea) 05/24/2015  . 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 ESRD on hemodialysis ([place]) 05/13/2015  . Volume overload 04/28/2015  . Bipolar depression ([place]) 04/28/2015  . Benign hypertension with end-stage renal disease ([place]) 04/28/2015  . Chronic back pain 04/28/2015  . Facial swelling 04/28/2015  . Left upper extremity swelling     Past Surgical History:  Procedure Laterality Date  . INSERTION OF DIALYSIS CATHETER    . INSERTION OF DIALYSIS CATHETER N/A 05/29/2015   Procedure: INSERTION OF DIALYSIS CATHETER;  Surgeon: Angelia Mould, MD;  Location: [facility];  Service: Vascular;  Laterality: N/A;  . NO PAST  SURGERIES    . PERIPHERAL VASCULAR CATHETERIZATION N/A 10/25/2015   Procedure: Dialysis/Perma Catheter Insertion;  Surgeon: Katha Cabal, MD;  Location: [facility];  Service: Cardiovascular;  Laterality: N/A;  . PERIPHERAL VASCULAR CATHETERIZATION Left 02/22/2016   Procedure: A/V Shuntogram/Fistulagram;  Surgeon: Algernon Huxley, MD;  Location: [facility];  Service: Cardiovascular;  Laterality: Left;  . PERIPHERAL VASCULAR CATHETERIZATION Right 03/19/2016   Procedure: Dialysis/Perma Catheter Insertion;  Surgeon: Katha Cabal, MD;  Location: [facility];  Service: Cardiovascular;  Laterality: Right;  . PERIPHERAL VASCULAR CATHETERIZATION N/A 04/22/2016   Procedure: Dialysis/Perma Catheter Removal;  Surgeon: Algernon Huxley, MD;  Location: [facility];  Service: Cardiovascular;  Laterality: N/A;       Home Medications    Prior to Admission medications   Medication Sig Start Date End Date Taking? Authorizing Provider  acetaminophen (TYLENOL) 325 MG tablet Take 650 mg by mouth every 6 (six) hours as needed for mild pain, fever or headache.    Historical Provider, MD  amiodarone (PACERONE) 400 MG tablet Take 1 tablet (400 mg total) by mouth daily. 12/14/15   Hillary Bow, MD  apixaban (ELIQUIS) 5 MG TABS tablet Take 1-2 mg by mouth 2 (two) times daily.     Historical Provider, MD  aspirin 81 MG chewable tablet Chew 1 tablet (81 mg total) by mouth daily. 06/02/15   Maryellen Pile, MD  b complex-vitamin c-folic acid (NEPHRO-VITE) 0.8 MG  TABS tablet Take 1 tablet by mouth 2 (two) times daily.    Historical Provider, MD  capsaicin (ZOSTRIX) 0.025 % cream Apply 1 application topically 2 (two) times daily as needed (for lower back pain).    Historical Provider, MD  ipratropium-albuterol (DUONEB) 0.5-2.5 (3) MG/3ML SOLN Take 3 mLs by nebulization every 6 (six) hours as needed (for wheezing/shortness of breath).    Historical Provider, MD  lamoTRIgine (LAMICTAL) 25 MG  tablet Take 50 mg by mouth daily.    Historical Provider, MD  levothyroxine (SYNTHROID, LEVOTHROID) 50 MCG tablet Take 50 mcg by mouth daily before breakfast.    Historical Provider, MD  loratadine (CLARITIN) 10 MG tablet Take 1 tablet (10 mg total) by mouth daily. 05/24/15   Gerlene Fee, NP  metoprolol tartrate (LOPRESSOR) 25 MG tablet Take 1 tablet (25 mg total) by mouth 2 (two) times daily. 12/14/15   Srikar Sudini, MD  mirtazapine (REMERON) 15 MG tablet Take 15 mg by mouth at bedtime.    Historical Provider, MD  polyethylene glycol (MIRALAX / GLYCOLAX) packet Take 17 g by mouth daily as needed for mild constipation.     Historical Provider, MD  sevelamer (RENAGEL) 800 MG tablet Take 2,400 mg by mouth 3 (three) times daily with meals.    Historical Provider, MD  Skin Protectants, Misc. (EUCERIN) cream Apply 1 application topically 2 (two) times daily.    Historical Provider, MD  Travoprost, BAK Free, (TRAVATAN) 0.004 % SOLN ophthalmic solution Place 1 drop into both eyes at bedtime.    Historical Provider, MD    Family History Family History  Problem Relation Age of Onset  . Hypertension Other   . Diabetes Other     Social History Social History  Substance Use Topics  . Smoking status: Never Smoker  . Smokeless tobacco: Never Used  . Alcohol use No   Positive marijuana use  Allergies   Penicillins   Review of Systems Review of Systems  Unable to perform ROS: Acuity of condition  Respiratory: Positive for shortness of breath.   Allergic/Immunologic: Positive for immunocompromised state.       Diabetic, hemodialysis patient     Physical Exam Updated Vital Signs BP 150/90 (BP Location: Right Arm)   Pulse 84   Resp 18   SpO2 100%   Physical Exam  Constitutional: He appears well-developed and well-nourished. He appears distressed.  Moderate respiratory distress  HENT:  Head: Normocephalic and atraumatic.  Eyelid swollen  Eyes: Conjunctivae are normal. Pupils are  equal, round, and reactive to light.  Neck: Neck supple. JVD present. No tracheal deviation present. No thyromegaly present.  Cardiovascular: Normal rate.   No murmur heard. Irregularly irregular  Pulmonary/Chest: He is in respiratory distress. He has rales.  Moderate respiratory distress. Diffuse rales  Abdominal: Soft. Bowel sounds are normal. He exhibits no distension. There is no tenderness.  Obese  Musculoskeletal: Normal range of motion. He exhibits no edema or tenderness.  Dialysis fistula at left upper extremity, with good thrill  Neurological: He is alert. Coordination normal.  Skin: Skin is warm and dry. No rash noted.  Psychiatric: He has a normal mood and affect.  Nursing note and vitals reviewed.    ED Treatments / Results  Labs (all labs ordered are listed, but only abnormal results are displayed) Labs Reviewed  I-STAT CHEM 8, ED - Abnormal; Notable for the following:       Result Value   Chloride 99 (*)    BUN 58 (*)  Creatinine, Ser 16.50 (*)    Calcium, Ion 0.84 (*)    Hemoglobin 11.9 (*)    HCT 35.0 (*)    All other components within normal limits    EKG  EKG Interpretation  Date/Time:  Monday May 27 2016 11:01:29 EDT Ventricular Rate:  81 PR Interval:    QRS Duration: 86 QT Interval:  401 QTC Calculation: 466 R Axis:   57 Text Interpretation:  Atrial fibrillation Borderline low voltage, extremity leads Abnormal T, consider ischemia, diffuse leads No significant change since last tracing Confirmed by Winfred Leeds  MD, Austin Pongratz ([id number]) on 05/27/2016 11:28:57 AM       Radiology No results found.  Procedures Procedures (including critical care time)  Medications Ordered in ED Medications - No data to display BIPAP placed on pt on arrival here Dr Jonnie Finner from nephrology service consulted for emergent dialysis will see patient in ED Initial Impression / Assessment and Plan / ED Course  I have reviewed the triage vital signs and the nursing  notes.  Pertinent labs & imaging results that were available during my care of the patient were reviewed by me and considered in my medical decision making (see chart for details).  Clinical Course    Chest x-ray viewed by me. Dr.Schertz unsalted and arranged for emergent hemodialysis Results for orders placed or performed during the hospital encounter of 05/27/16  I-Stat Chem 8, ED  Result Value Ref Range   Sodium 139 135 - 145 mmol/L   Potassium 4.6 3.5 - 5.1 mmol/L   Chloride 99 (L) 101 - 111 mmol/L   BUN 58 (H) 6 - 20 mg/dL   Creatinine, Ser 16.50 (H) 0.61 - 1.24 mg/dL   Glucose, Bld 85 65 - 99 mg/dL   Calcium, Ion 0.84 (LL) 1.15 - 1.40 mmol/L   TCO2 27 0 - 100 mmol/L   Hemoglobin 11.9 (L) 13.0 - 17.0 g/dL   HCT 35.0 (L) 39.0 - 52.0 %   Dg Chest Portable 1 View  Result Date: 05/27/2016 CLINICAL DATA:  Shortness of breath since last evening. Missed recent dialysis. EXAM: PORTABLE CHEST 1 VIEW COMPARISON:  03/20/2016 FINDINGS: The heart is enlarged but stable. Stable prominent mediastinal and hilar contours. Moderate interstitial pulmonary edema but no pleural effusions. IMPRESSION: Stable cardiac enlargement and moderate interstitial pulmonary edema. Electronically Signed   By: Marijo Sanes M.D.   On: 05/27/2016 11:31   Final Clinical Impressions(s) / ED Diagnoses  Diagnosis #1 acute pulmonary edema #2 respiratory failure #3 noncompliance with hemodialysis Final diagnoses:  None   CRITICAL CARE Performed by: Orlie Dakin Total critical care time: 30 minutes Critical care time was exclusive of separately billable procedures and treating other patients. Critical care was necessary to treat or prevent imminent or life-threatening deterioration. Critical care was time spent personally by me on the following activities: development of treatment plan with patient and/or surrogate as well as nursing, discussions with consultants, evaluation of patient's response to treatment,  examination of patient, obtaining history from patient or surrogate, ordering and performing treatments and interventions, ordering and review of laboratory studies, ordering and review of radiographic studies, pulse oximetry and re-evaluation of patient's condition. New Prescriptions New Prescriptions   No medications on file     Orlie Dakin, MD 05/27/16 [place], MD 05/27/16 1655

## 2016-05-27 NOTE — Procedures (Signed)
See consult note as well.  TTS dialysis patient admitted for SOB and pulm edema.  On bipap and feeling better. No CP.  On HD now, 3.7 kg off and breathing is better, back to baseline.  OK for dc home after HD today. Should go to usual HD tomorrow.     I was present at this dialysis session, have reviewed the session itself and made  appropriate changes Kelly Splinter MD [place] pager [phone]    cell [phone] 05/27/2016, 4:36 PM

## 2016-05-27 NOTE — Progress Notes (Signed)
Patient taken off of bipap and placed on 3L nasal cannula.  Currently tolerating well attempting to make phone calls.  Will continue to monitor.

## 2016-05-27 NOTE — ED Notes (Signed)
Renal MD at bedside.

## 2016-05-27 NOTE — Progress Notes (Signed)
Patient's LUA AVF swollen, painful and oozing blood, Marty PA at bedside to examine.     1605 Patient ended treatment early 11 mins AMA, AVF oozing blood, painful and swollen, Md aware.  3.7L removed.  Patient discharged to nursing home via ambulance service.  All belongings with patient.  Patient alert and oriented, VSS.

## 2016-05-27 NOTE — ED Triage Notes (Signed)
Per ems- pt comes from [facility] c/o SOB. Is dialysis patient and missed on Saturday because he didn't have any clean clothes. Fire dept reports 87% RA. Pt was SOB, rales, BP 187/137. Placed on on 4 L [place] and improvement to 100%. Placed on CPAP. BP 172/122 after CPAP. Pt denied any pain, CBG 124. Pt is a x 4.

## 2016-05-27 NOTE — Progress Notes (Signed)
Patient brought in by EMS on EMS CPAP machine.  Patient taken off of EMS machine and placed on hospital's bipap per MD.  Patient currently tolerating well.  Will continue to monitor.

## 2016-06-03 DIAGNOSIS — T462X1A Poisoning by other antidysrhythmic drugs, accidental (unintentional), initial encounter: Secondary | ICD-10-CM

## 2016-06-03 DIAGNOSIS — E032 Hypothyroidism due to medicaments and other exogenous substances: Secondary | ICD-10-CM | POA: Insufficient documentation

## 2016-06-11 ENCOUNTER — Encounter: Payer: Self-pay | Admitting: Internal Medicine

## 2016-06-11 ENCOUNTER — Non-Acute Institutional Stay (SKILLED_NURSING_FACILITY): Payer: Medicaid Other | Admitting: Internal Medicine

## 2016-06-11 DIAGNOSIS — R569 Unspecified convulsions: Secondary | ICD-10-CM

## 2016-06-11 DIAGNOSIS — N186 End stage renal disease: Secondary | ICD-10-CM

## 2016-06-11 NOTE — Progress Notes (Signed)
He does not have any complaintsPatient ID: Noah Lewis, male   DOB: 06-10-1951, 65 y.o.   MRN: [id number]   Location:  [facility] Room Number: 215-B Place of Service:  SNF (31) Provider:  Granville Lewis, PA-C  Elyse Jarvis, MD  Patient Care Team: Theotis Burrow, MD as PCP - General (Family Medicine)  Extended Emergency Contact Information Primary Emergency Contact: Hayes,Barbara Address: [place], [place] [address] [place] Phone: [phone] Relation: Mother Secondary Emergency Contact: Hayes,[name] Address: [address]          [place], [place] [address] [place] Phone: [phone] Relation: Brother Goals of care: Advanced Directive information Advanced Directives 05/20/2016  Does patient have an advance directive? Yes  Type of Advance Directive Out of facility DNR (pink MOST or yellow form)  Does patient want to make changes to advanced directive? No - Patient declined  Copy of advanced directive(s) in chart? Yes  Would patient like information on creating an advanced directive? -     Chief Complaint  Patient presents with  . Acute Visit    Possible seizure    HPI:  Pt is a 65 y.o. male seen today for an acute visit for Possible seizure activity last night.  Apparently patient had a short episode burs eyes rolled back any had decreased responsiveness family did go to call EMS however by the time they return to the room patient apparently was back at his baseline and he refused to go to the hospital for evaluation.  I do not see previous listed history of seizures and he has no complaints today.  He does have end-stage renal disease and is on dialysis   Past Medical History:  Diagnosis Date  . Arthritis   . Asthma   . Cancer ([place])   . COPD (chronic obstructive pulmonary disease) ([place])   . Coronary artery disease   . Diabetes mellitus without complication  ([place])   . Hypertension   . Renal disorder    Past Surgical History:  Procedure Laterality Date  . INSERTION OF DIALYSIS CATHETER    . INSERTION OF DIALYSIS CATHETER N/A 05/29/2015   Procedure: INSERTION OF DIALYSIS CATHETER;  Surgeon: Angelia Mould, MD;  Location: [facility];  Service: Vascular;  Laterality: N/A;  . NO PAST SURGERIES    . PERIPHERAL VASCULAR CATHETERIZATION N/A 10/25/2015   Procedure: Dialysis/Perma Catheter Insertion;  Surgeon: Katha Cabal, MD;  Location: [facility];  Service: Cardiovascular;  Laterality: N/A;  . PERIPHERAL VASCULAR CATHETERIZATION Left 02/22/2016   Procedure: A/V Shuntogram/Fistulagram;  Surgeon: Algernon Huxley, MD;  Location: [facility];  Service: Cardiovascular;  Laterality: Left;  . PERIPHERAL VASCULAR CATHETERIZATION Right 03/19/2016   Procedure: Dialysis/Perma Catheter Insertion;  Surgeon: Katha Cabal, MD;  Location: [facility];  Service: Cardiovascular;  Laterality: Right;  . PERIPHERAL VASCULAR CATHETERIZATION N/A 04/22/2016   Procedure: Dialysis/Perma Catheter Removal;  Surgeon: Algernon Huxley, MD;  Location: [facility];  Service: Cardiovascular;  Laterality: N/A;    Allergies  Allergen Reactions  . Penicillins Other (See Comments)    Reaction:  Unknown       Medication List       Accurate as of 06/11/16 11:47 AM. Always use your most recent med list.          acetaminophen 325 MG tablet Commonly  known as:  TYLENOL Take 650 mg by mouth every 6 (six) hours as needed for mild pain, fever or headache.   amiodarone 400 MG tablet Commonly known as:  PACERONE Take 1 tablet (400 mg total) by mouth daily.   aspirin 81 MG chewable tablet Chew 1 tablet (81 mg total) by mouth daily.   b complex-vitamin c-folic acid 0.8 MG Tabs tablet Take 1 tablet by mouth 2 (two) times daily.   capsaicin 0.025 % cream Commonly known as:  ZOSTRIX Apply 1 application topically 2 (two) times daily as needed (for  lower back pain).   ELIQUIS 5 MG Tabs tablet Generic drug:  apixaban Take 1-2 mg by mouth 2 (two) times daily.   eucerin cream Apply 1 application topically 2 (two) times daily.   ipratropium-albuterol 0.5-2.5 (3) MG/3ML Soln Commonly known as:  DUONEB Take 3 mLs by nebulization every 6 (six) hours as needed (for wheezing/shortness of breath).   lamoTRIgine 25 MG tablet Commonly known as:  LAMICTAL Take 50 mg by mouth daily.   levothyroxine 50 MCG tablet Commonly known as:  SYNTHROID, LEVOTHROID Take 50 mcg by mouth daily before breakfast.   loratadine 10 MG tablet Commonly known as:  CLARITIN Take 1 tablet (10 mg total) by mouth daily.   metoprolol tartrate 25 MG tablet Commonly known as:  LOPRESSOR Take 1 tablet (25 mg total) by mouth 2 (two) times daily.   mirtazapine 15 MG tablet Commonly known as:  REMERON Take 15 mg by mouth at bedtime.   polyethylene glycol packet Commonly known as:  MIRALAX / GLYCOLAX Take 17 g by mouth daily as needed for mild constipation.   sevelamer 800 MG tablet Commonly known as:  RENAGEL Take 2,400 mg by mouth 3 (three) times daily with meals.   Travoprost (BAK Free) 0.004 % Soln ophthalmic solution Commonly known as:  TRAVATAN Place 1 drop into both eyes at bedtime.       Review of Systems   Constitutional: Negative for appetite change and fatigue.  Respiratory: Negative for cough, chest tightness and shortness of breath.   Cardiovascular: Negative for chest pain and leg swelling.  Gastrointestinal: Negative for abdominal pain and constipation.  Musculoskeletal: Negative for myalgias and arthralgias.  Skin: Negative for pallor.  Neurologic is not complaining of headache dizziness syncopal-type feelings or further question seizure activity Psychiatric/Behavioral: The patient is not nervous/anxious. He does not speak much   Immunization History  Administered Date(s) Administered  . PPD Test 05/01/2016, 05/01/2016,  05/01/2016   Pertinent  Health Maintenance Due  Topic Date Due  . FOOT EXAM  07/08/1961  . OPHTHALMOLOGY EXAM  07/08/1961  . URINE MICROALBUMIN  07/08/1961  . COLONOSCOPY  07/08/2001  . HEMOGLOBIN A1C  10/29/2015  . INFLUENZA VACCINE  06/19/2016 (Originally 04/23/2016)   No flowsheet data found. Functional Status Survey:    Vitals:   06/11/16 1145  BP: 130/70  Pulse: 81  Resp: 18  Temp: 98.1 F (36.7 C)  TempSrc: Oral  SpO2: 97%  Weight: 200 lb 6 oz (90.9 kg)  Height: 5\' 7"  (1.702 m)   Body mass index is 31.38 kg/m. Physical Exam   Constitutional: No distress.  Overweight   Eyes: Conjunctivae are normal.  Neck: Neck supple. No JVD present. No thyromegaly present.  Cardiovascular: Normal rate, regular rhythm with an occasional irregular beat a.   Respiratory: Effort normal and breath sounds normal. No respiratory distress. He has no wheezes.  GI: Soft. Bowel sounds are normal. He exhibits no  distension. There is no tenderness.  Musculoskeletal: He exhibits no edema.  Neurologic cannot appreciate any lateralizing findings cranial nerves appear to be grossly intact  Able to move all extremities   Lymphadenopathy:    He has no cervical adenopathy.  Neurological: He is alert.  Skin: Skin is warm and dry. He is not diaphoretic.   shunt with  bruit on left upper arm  Psychiatric: He has a normal mood and affect.      Labs reviewed:  Recent Labs  09/21/15 1437  12/05/15 1507  12/10/15 0702  12/12/15 0729 12/15/15 1620 03/16/16 2350 05/03/16 05/27/16 1117  NA  --   < > 133*  < > 130*  < >  --  132* 141 141 139  K  --   < > 3.8  < > 4.6  --  4.1 5.7* 4.8 5.0 4.6  CL  --   < > 95*  < > 94*  --   --  96* 104  --  99*  CO2  --   < > 26  < > 21*  --   --  23 21*  --   --   GLUCOSE  --   < > 107*  < > 84  --   --  105* 117*  --  85  BUN  --   < > 63*  < > 66*  --   --  87* 88* 98* 58*  CREATININE  --   < > 8.39*  < > 9.71*  --   --  9.23* 14.43* 15.8* 16.50*    CALCIUM  --   < > 8.1*  < > 7.5*  --   --  7.5* 8.5*  --   --   MG  --   --   --   --  2.6*  --  2.3  --   --   --   --   PHOS 8.4*  --  3.6  --   --   --   --  6.7*  --   --   --   < > = values in this interval not displayed.  Recent Labs  12/04/15 1544 12/05/15 1507 12/15/15 1620 03/16/16 2350  AST 33  --   --  33  ALT 24  --   --  34  ALKPHOS 84  --   --  127*  BILITOT 0.8  --   --  0.1*  PROT 7.7  --   --  7.4  ALBUMIN 3.4* 3.2* 2.5* 3.7    Recent Labs  12/14/15 0649 12/15/15 1620 03/16/16 2350 05/03/16 05/27/16 1117  WBC 11.9* 12.3* 8.2 6.9  --   NEUTROABS  --  9.9*  --   --   --   HGB 8.5* 8.5* 10.7* 10.3* 11.9*  HCT 25.9* 26.0* 33.1* 33* 35.0*  MCV 86.0 84.4 90.1  --   --   PLT 297 364 217 227  --    Lab Results  Component Value Date   TSH 16.21 (A) 05/03/2016   Lab Results  Component Value Date   HGBA1C 5.7 (H) 04/28/2015   Lab Results  Component Value Date   CHOL 101 05/03/2016   HDL 51 05/03/2016   LDLCALC 35 05/03/2016   TRIG 77 05/03/2016   CHOLHDL 4.2 CALC 12/23/2007    Significant Diagnostic Results in last 30 days:  Dg Chest Portable 1 View  Result Date: 05/27/2016 CLINICAL DATA:  Shortness of  breath since last evening. Missed recent dialysis. EXAM: PORTABLE CHEST 1 VIEW COMPARISON:  03/20/2016 FINDINGS: The heart is enlarged but stable. Stable prominent mediastinal and hilar contours. Moderate interstitial pulmonary edema but no pleural effusions. IMPRESSION: Stable cardiac enlargement and moderate interstitial pulmonary edema. Electronically Signed   By: Marijo Sanes M.D.   On: 05/27/2016 11:31    Assessment/Plan  #1 question seizure activity-there's been no reoccurrence-he is a dialysis patient will try to obtain blood work including CBC CMP and TSH tomorrow to hopefully be drawn at dialysis if not obtained here-continue to monitor neuro checks every 2 hours with vital signs every shift.  Clinically appears to be stable and at baseline  today but this will have to be watched  This plan was discussed with Dr. Eulas Post via phone  [phone] note greater than 25 minutes spent assessing patient-reviewing his chart-discussing his status with nursing staff-and coordinating and formulating a plan of care-of note greater than 50% of time spent coordinating plan of care

## 2016-06-12 LAB — HEPATIC FUNCTION PANEL
ALT: 16 U/L (ref 10–40)
AST: 15 U/L (ref 14–40)
Alkaline Phosphatase: 113 U/L (ref 25–125)
BILIRUBIN, TOTAL: 0.6 mg/dL

## 2016-06-12 LAB — CBC AND DIFFERENTIAL
HCT: 34 % — AB (ref 41–53)
Hemoglobin: 10.9 g/dL — AB (ref 13.5–17.5)
PLATELETS: 184 10*3/uL (ref 150–399)
WBC: 6.3 10*3/mL

## 2016-06-12 LAB — BASIC METABOLIC PANEL
BUN: 60 mg/dL — AB (ref 4–21)
Creatinine: 15 mg/dL — AB (ref 0.6–1.3)
GLUCOSE: 82 mg/dL
POTASSIUM: 5.4 mmol/L — AB (ref 3.4–5.3)
Sodium: 142 mmol/L (ref 137–147)

## 2016-06-12 LAB — TSH: TSH: 16.62 u[IU]/mL — AB (ref 0.41–5.90)

## 2016-06-13 ENCOUNTER — Non-Acute Institutional Stay (HOSPITAL_COMMUNITY)
Admission: EM | Admit: 2016-06-13 | Discharge: 2016-06-13 | Disposition: A | Discharge status: Home / Self Care | Payer: Medicaid Other | Attending: Emergency Medicine | Admitting: Emergency Medicine

## 2016-06-13 ENCOUNTER — Encounter: Payer: Self-pay | Admitting: Internal Medicine

## 2016-06-13 ENCOUNTER — Non-Acute Institutional Stay (SKILLED_NURSING_FACILITY): Payer: Medicaid Other | Admitting: Internal Medicine

## 2016-06-13 ENCOUNTER — Emergency Department (HOSPITAL_COMMUNITY): Payer: Medicaid Other

## 2016-06-13 DIAGNOSIS — I482 Chronic atrial fibrillation: Secondary | ICD-10-CM | POA: Diagnosis not present

## 2016-06-13 DIAGNOSIS — Z7982 Long term (current) use of aspirin: Secondary | ICD-10-CM | POA: Insufficient documentation

## 2016-06-13 DIAGNOSIS — J449 Chronic obstructive pulmonary disease, unspecified: Secondary | ICD-10-CM | POA: Insufficient documentation

## 2016-06-13 DIAGNOSIS — E039 Hypothyroidism, unspecified: Secondary | ICD-10-CM | POA: Insufficient documentation

## 2016-06-13 DIAGNOSIS — J810 Acute pulmonary edema: Secondary | ICD-10-CM | POA: Diagnosis present

## 2016-06-13 DIAGNOSIS — M549 Dorsalgia, unspecified: Secondary | ICD-10-CM | POA: Insufficient documentation

## 2016-06-13 DIAGNOSIS — Z992 Dependence on renal dialysis: Secondary | ICD-10-CM | POA: Insufficient documentation

## 2016-06-13 DIAGNOSIS — G8929 Other chronic pain: Secondary | ICD-10-CM | POA: Insufficient documentation

## 2016-06-13 DIAGNOSIS — J9601 Acute respiratory failure with hypoxia: Secondary | ICD-10-CM | POA: Diagnosis not present

## 2016-06-13 DIAGNOSIS — E1122 Type 2 diabetes mellitus with diabetic chronic kidney disease: Secondary | ICD-10-CM | POA: Insufficient documentation

## 2016-06-13 DIAGNOSIS — J811 Chronic pulmonary edema: Secondary | ICD-10-CM | POA: Diagnosis not present

## 2016-06-13 DIAGNOSIS — E875 Hyperkalemia: Secondary | ICD-10-CM | POA: Insufficient documentation

## 2016-06-13 DIAGNOSIS — G4733 Obstructive sleep apnea (adult) (pediatric): Secondary | ICD-10-CM | POA: Diagnosis not present

## 2016-06-13 DIAGNOSIS — Z833 Family history of diabetes mellitus: Secondary | ICD-10-CM | POA: Diagnosis not present

## 2016-06-13 DIAGNOSIS — N186 End stage renal disease: Secondary | ICD-10-CM

## 2016-06-13 DIAGNOSIS — Z79899 Other long term (current) drug therapy: Secondary | ICD-10-CM | POA: Diagnosis not present

## 2016-06-13 DIAGNOSIS — F3189 Other bipolar disorder: Secondary | ICD-10-CM | POA: Diagnosis not present

## 2016-06-13 DIAGNOSIS — I2510 Atherosclerotic heart disease of native coronary artery without angina pectoris: Secondary | ICD-10-CM | POA: Diagnosis not present

## 2016-06-13 DIAGNOSIS — D631 Anemia in chronic kidney disease: Secondary | ICD-10-CM | POA: Insufficient documentation

## 2016-06-13 DIAGNOSIS — I120 Hypertensive chronic kidney disease with stage 5 chronic kidney disease or end stage renal disease: Secondary | ICD-10-CM | POA: Diagnosis not present

## 2016-06-13 DIAGNOSIS — M1990 Unspecified osteoarthritis, unspecified site: Secondary | ICD-10-CM | POA: Diagnosis not present

## 2016-06-13 DIAGNOSIS — Z86718 Personal history of other venous thrombosis and embolism: Secondary | ICD-10-CM | POA: Diagnosis not present

## 2016-06-13 DIAGNOSIS — Z880 Allergy status to penicillin: Secondary | ICD-10-CM | POA: Insufficient documentation

## 2016-06-13 DIAGNOSIS — E032 Hypothyroidism due to medicaments and other exogenous substances: Secondary | ICD-10-CM | POA: Diagnosis not present

## 2016-06-13 DIAGNOSIS — Z8249 Family history of ischemic heart disease and other diseases of the circulatory system: Secondary | ICD-10-CM | POA: Diagnosis not present

## 2016-06-13 DIAGNOSIS — T462X5A Adverse effect of other antidysrhythmic drugs, initial encounter: Secondary | ICD-10-CM | POA: Insufficient documentation

## 2016-06-13 DIAGNOSIS — Z859 Personal history of malignant neoplasm, unspecified: Secondary | ICD-10-CM | POA: Insufficient documentation

## 2016-06-13 DIAGNOSIS — Z7901 Long term (current) use of anticoagulants: Secondary | ICD-10-CM | POA: Insufficient documentation

## 2016-06-13 DIAGNOSIS — F319 Bipolar disorder, unspecified: Secondary | ICD-10-CM | POA: Insufficient documentation

## 2016-06-13 LAB — I-STAT CHEM 8, ED
BUN: 84 mg/dL — ABNORMAL HIGH (ref 6–20)
Calcium, Ion: 0.74 mmol/L — CL (ref 1.15–1.40)
Chloride: 99 mmol/L — ABNORMAL LOW (ref 101–111)
Creatinine, Ser: >18 mg/dL — ABNORMAL HIGH (ref 0.61–1.24)
Glucose, Bld: 83 mg/dL (ref 65–99)
HEMATOCRIT: 36 % — AB (ref 39.0–52.0)
HEMOGLOBIN: 12.2 g/dL — AB (ref 13.0–17.0)
Potassium: 5.8 mmol/L — ABNORMAL HIGH (ref 3.5–5.1)
SODIUM: 138 mmol/L (ref 135–145)
TCO2: 27 mmol/L (ref 0–100)

## 2016-06-13 MED ORDER — LIDOCAINE-PRILOCAINE 2.5-2.5 % EX CREA: 1.0000 "application " | TOPICAL_CREAM | CUTANEOUS | Status: DC | PRN

## 2016-06-13 MED ORDER — SODIUM CHLORIDE 0.9 % IV SOLN: 100.0000 mL | INTRAVENOUS | Status: DC | PRN

## 2016-06-13 MED ORDER — PENTAFLUOROPROP-TETRAFLUOROETH EX AERO: 1.0000 "application " | INHALATION_SPRAY | CUTANEOUS | Status: DC | PRN

## 2016-06-13 MED ORDER — HEPARIN SODIUM (PORCINE) 1000 UNIT/ML DIALYSIS
5000.0000 [IU] | Freq: Once | INTRAMUSCULAR | Status: DC
Start: 2016-06-13 — End: 2016-06-14

## 2016-06-13 MED ORDER — LIDOCAINE HCL (PF) 1 % IJ SOLN: 5.0000 mL | INTRAMUSCULAR | Status: DC | PRN

## 2016-06-13 MED ORDER — CALCITRIOL 0.5 MCG PO CAPS
0.5000 ug | ORAL_CAPSULE | Freq: Once | ORAL | Status: AC
Start: 2016-06-13 — End: 2016-06-13
  Administered 2016-06-13: 0.5 ug via ORAL
  Filled 2016-06-13: qty 1

## 2016-06-13 NOTE — ED Notes (Signed)
Attempted IV to right upper arm, unsuccessful, blood obtain and site blew, held pressure

## 2016-06-13 NOTE — Progress Notes (Signed)
Patient ID: Noah Lewis, male   DOB: November 23, 1950, 65 y.o.   MRN: [id number]   Location:  [facility] Room Number: 215-B Place of Service:  SNF (31) Provider:  Granville Lewis, PA-C  Elyse Jarvis, MD  Patient Care Team: Theotis Burrow, MD as PCP - General (Family Medicine)  Extended Emergency Contact Information Primary Emergency Contact: Hayes,Barbara Address: [place], [place] [address] [place] Phone: [phone] Relation: Mother Secondary Emergency Contact: Hayes,[name] Address: [address]          [place], [place] [address] [place] Phone: [phone] Relation: Brother  Code Status:  Full Code Goals of care: Advanced Directive information Advanced Directives 06/11/2016  Does patient have an advance directive? No  Type of Advance Directive -  Does patient want to make changes to advanced directive? -  Copy of advanced directive(s) in chart? -  Would patient like information on creating an advanced directive? -     Chief Complaint  Patient presents with  . Acute Visit    Shortness of breath    HPI:  Pt is a 65 y.o. male seen today for an acute visit for Redness of breath as well as hypoxia with O2 saturation in the 70s.  Patient does have a history of end-stage renal disease on chronic hemodialysis but apparently refused dialysis a couple days ago.  He is not complaining of any chest pain fever or chills or cough.     Past Medical History:  Diagnosis Date  . Arthritis   . Asthma   . Cancer ([facility])   . COPD (chronic obstructive pulmonary disease) ([facility])   . Coronary artery disease   . Diabetes mellitus without complication ([facility])   . Hypertension   . Renal disorder    Past Surgical History:  Procedure Laterality Date  . INSERTION OF DIALYSIS CATHETER    . INSERTION OF DIALYSIS CATHETER N/A 05/29/2015   Procedure: INSERTION OF DIALYSIS CATHETER;  Surgeon:  Angelia Mould, MD;  Location: [facility];  Service: Vascular;  Laterality: N/A;  . NO PAST SURGERIES    . PERIPHERAL VASCULAR CATHETERIZATION N/A 10/25/2015   Procedure: Dialysis/Perma Catheter Insertion;  Surgeon: Katha Cabal, MD;  Location: [facility];  Service: Cardiovascular;  Laterality: N/A;  . PERIPHERAL VASCULAR CATHETERIZATION Left 02/22/2016   Procedure: A/V Shuntogram/Fistulagram;  Surgeon: Algernon Huxley, MD;  Location: [facility];  Service: Cardiovascular;  Laterality: Left;  . PERIPHERAL VASCULAR CATHETERIZATION Right 03/19/2016   Procedure: Dialysis/Perma Catheter Insertion;  Surgeon: Katha Cabal, MD;  Location: [facility];  Service: Cardiovascular;  Laterality: Right;  . PERIPHERAL VASCULAR CATHETERIZATION N/A 04/22/2016   Procedure: Dialysis/Perma Catheter Removal;  Surgeon: Algernon Huxley, MD;  Location: [facility];  Service: Cardiovascular;  Laterality: N/A;    Allergies  Allergen Reactions  . Penicillins Other (See Comments)    Reaction:  Unknown       Medication List       Accurate as of 06/13/16 10:35 AM. Always use your most recent med list.          acetaminophen 325 MG tablet Commonly known as:  TYLENOL Take 650 mg by mouth every 6 (six) hours as needed for mild pain, fever or headache.   amiodarone 400 MG tablet Commonly known as:  PACERONE Take 1 tablet (400 mg total)  by mouth daily.   aspirin 81 MG chewable tablet Chew 1 tablet (81 mg total) by mouth daily.   b complex-vitamin c-folic acid 0.8 MG Tabs tablet Take 1 tablet by mouth 2 (two) times daily.   capsaicin 0.025 % cream Commonly known as:  ZOSTRIX Apply 1 application topically 2 (two) times daily as needed (for lower back pain).   ELIQUIS 5 MG Tabs tablet Generic drug:  apixaban Take 1-2 mg by mouth 2 (two) times daily.   eucerin cream Apply 1 application topically 2 (two) times daily.   ipratropium-albuterol 0.5-2.5 (3) MG/3ML  Soln Commonly known as:  DUONEB Take 3 mLs by nebulization every 6 (six) hours as needed (for wheezing/shortness of breath).   lamoTRIgine 25 MG tablet Commonly known as:  LAMICTAL Take 50 mg by mouth daily.   levothyroxine 50 MCG tablet Commonly known as:  SYNTHROID, LEVOTHROID Take 50 mcg by mouth daily before breakfast.   loratadine 10 MG tablet Commonly known as:  CLARITIN Take 1 tablet (10 mg total) by mouth daily.   metoprolol tartrate 25 MG tablet Commonly known as:  LOPRESSOR Take 1 tablet (25 mg total) by mouth 2 (two) times daily.   mirtazapine 15 MG tablet Commonly known as:  REMERON Take 15 mg by mouth at bedtime.   polyethylene glycol packet Commonly known as:  MIRALAX / GLYCOLAX Take 17 g by mouth daily as needed for mild constipation.   sevelamer 800 MG tablet Commonly known as:  RENAGEL Take 2,400 mg by mouth 3 (three) times daily with meals.   Travoprost (BAK Free) 0.004 % Soln ophthalmic solution Commonly known as:  TRAVATAN Place 1 drop into both eyes at bedtime.       Review of Systems    Constitutional: Appears weak  Respiratory: Positive for shortness of breath and hypoxia no overt cough Cardiovascular: Negative for chest pain   Gastrointestinal: Negative for abdominal pain and constipation.  Musculoskeletal: Negative for myalgias and arthralgias.  Skin: Negative for pallor.  Neurologic is not complaining of headache dizziness syncopal-type feelings some thought he may have had a seizure earlier this week but nursing's does not report any seizure type activity this morning Psychiatric/Behavioral: The patient is not nervous/anxious. He does not speak much   Immunization History  Administered Date(s) Administered  . PPD Test 05/01/2016, 05/01/2016, 05/01/2016   Pertinent  Health Maintenance Due  Topic Date Due  . FOOT EXAM  07/08/1961  . OPHTHALMOLOGY EXAM  07/08/1961  . URINE MICROALBUMIN  07/08/1961  . COLONOSCOPY  07/08/2001  .  HEMOGLOBIN A1C  10/29/2015  . INFLUENZA VACCINE  06/19/2016 (Originally 04/23/2016)   No flowsheet data found. Functional Status Survey:    Vitals:   06/13/16 1027  BP: 140/70  Pulse: 80  Resp: 20  Temp: 97.8 F (36.6 C)  TempSrc: Oral  SpO2: 97%  Weight: 200 lb 8 oz (90.9 kg)  Height: 5\' 7"  (1.702 m)  Of note O2 saturation this morning is actually in the 70s goes up into the high 80s on oxygen Body mass index is 31.4 kg/m. Physical Exam Constitutional: No distress. Sitting comfortably in wheelchair  Overweight  Eyes: Conjunctivae are normal.  Neck: Neck supple. No JVD present. No thyromegaly present.  Cardiovascular: Normal rate, regular rhythm with an occasional irregular beat a  Respiratory: Has diffuse congested breath sounds inspiration and expiration more in the posterior lung fields-- but do not really note labored breathing No respiratory distress.  GI: Soft. Bowel sounds are normal. He  exhibits no distension. There is no tenderness.  Musculoskeletal: He exhibits mild   edema.  Neurologic cannot appreciate any lateralizing findings cranial nerves appear to be grossly intact  Able to move all extremities    Neurological: He is alert.  Skin: Skin is warm and dry. He is not diaphoretic.   shunt with  bruit on left upper arm  Psychiatric: He has a normal mood and affect is not speaking much which is not abnormal Labs reviewed:  Recent Labs  09/21/15 1437  12/05/15 1507  12/10/15 0702  12/12/15 0729 12/15/15 1620 03/16/16 2350 05/03/16 05/27/16 1117  NA  --   < > 133*  < > 130*  < >  --  132* 141 141 139  K  --   < > 3.8  < > 4.6  --  4.1 5.7* 4.8 5.0 4.6  CL  --   < > 95*  < > 94*  --   --  96* 104  --  99*  CO2  --   < > 26  < > 21*  --   --  23 21*  --   --   GLUCOSE  --   < > 107*  < > 84  --   --  105* 117*  --  85  BUN  --   < > 63*  < > 66*  --   --  87* 88* 98* 58*  CREATININE  --   < > 8.39*  < > 9.71*  --   --  9.23* 14.43* 15.8* 16.50*  CALCIUM   --   < > 8.1*  < > 7.5*  --   --  7.5* 8.5*  --   --   MG  --   --   --   --  2.6*  --  2.3  --   --   --   --   PHOS 8.4*  --  3.6  --   --   --   --  6.7*  --   --   --   < > = values in this interval not displayed.  Recent Labs  12/04/15 1544 12/05/15 1507 12/15/15 1620 03/16/16 2350  AST 33  --   --  33  ALT 24  --   --  34  ALKPHOS 84  --   --  127*  BILITOT 0.8  --   --  0.1*  PROT 7.7  --   --  7.4  ALBUMIN 3.4* 3.2* 2.5* 3.7    Recent Labs  12/14/15 0649 12/15/15 1620 03/16/16 2350 05/03/16 05/27/16 1117  WBC 11.9* 12.3* 8.2 6.9  --   NEUTROABS  --  9.9*  --   --   --   HGB 8.5* 8.5* 10.7* 10.3* 11.9*  HCT 25.9* 26.0* 33.1* 33* 35.0*  MCV 86.0 84.4 90.1  --   --   PLT 297 364 217 227  --    Lab Results  Component Value Date   TSH 16.21 (A) 05/03/2016   Lab Results  Component Value Date   HGBA1C 5.7 (H) 04/28/2015   Lab Results  Component Value Date   CHOL 101 05/03/2016   HDL 51 05/03/2016   LDLCALC 35 05/03/2016   TRIG 77 05/03/2016   CHOLHDL 4.2 CALC 12/23/2007    Significant Diagnostic Results in last 30 days:  Dg Chest Portable 1 View  Result Date: 05/27/2016 CLINICAL DATA:  Shortness of breath since last  evening. Missed recent dialysis. EXAM: PORTABLE CHEST 1 VIEW COMPARISON:  03/20/2016 FINDINGS: The heart is enlarged but stable. Stable prominent mediastinal and hilar contours. Moderate interstitial pulmonary edema but no pleural effusions. IMPRESSION: Stable cardiac enlargement and moderate interstitial pulmonary edema. Electronically Signed   By: Marijo Sanes M.D.   On: 05/27/2016 11:31    Assessment/Plan #1-hypoxia with shortness of breath-will send him to the ER for expedient evaluation he does not appear to be in any acute distress-one would suspect possibly he is having increased pulmonary edema with a history of refusing dialysis again will await ER evaluation  [phone]

## 2016-06-13 NOTE — Progress Notes (Signed)
[facility] transporting patient.

## 2016-06-13 NOTE — ED Notes (Signed)
Peripheral IV attempt x1 unsucessful.

## 2016-06-13 NOTE — ED Triage Notes (Signed)
Per EMS- pt has SOB. Pt is dialysis and is scheduled at 2pm. Pt had a breathing treatment by EMS last night but did not get transport.

## 2016-06-13 NOTE — ED Provider Notes (Addendum)
[facility] DEPT Provider Note   CSN: [id number] Arrival date & time: 06/13/16  1027     History   Chief Complaint Chief Complaint  Patient presents with  . Shortness of Breath    HPI Noah Lewis is a 65 y.o. male.Complains of shortness of breath onset 11:30 PM yesterday. No other associated symptoms. Patient did not have scheduled dialysis 2 days ago as he would not allow. No other associated symptoms. Treated with oxygen 2 L nasal cannula prior to coming here. Nothing makes symptoms better or worse.  HPI  Past Medical History:  Diagnosis Date  . Arthritis   . Asthma   . Cancer ([place])   . COPD (chronic obstructive pulmonary disease) ([place])   . Coronary artery disease   . Diabetes mellitus without complication ([place])   . Hypertension   . Renal disorder     Patient Active Problem List   Diagnosis Date Noted  . 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 Pulmonary edema 05/27/2016  . Back pain   . Bacteremia   . Chronic a-fib ([place]) 12/04/2015  . Hypertensive urgency 09/20/2015  . 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 Anemia of chronic kidney failure   . Left arm swelling   . End-stage renal disease needing dialysis ([place]) 05/29/2015  . 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 COPD, moderate ([place]) 05/24/2015  . OSA (obstructive sleep apnea) 05/24/2015  . 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 ESRD on hemodialysis ([place]) 05/13/2015  . Volume overload 04/28/2015  . Bipolar depression ([place]) 04/28/2015  . Benign hypertension with end-stage renal disease ([place]) 04/28/2015  . Chronic back pain 04/28/2015  . Facial swelling 04/28/2015  . Left upper extremity swelling     Past Surgical History:  Procedure Laterality Date  . INSERTION OF DIALYSIS CATHETER    . INSERTION OF DIALYSIS CATHETER N/A 05/29/2015   Procedure: INSERTION OF DIALYSIS CATHETER;   Surgeon: Angelia Mould, MD;  Location: [facility];  Service: Vascular;  Laterality: N/A;  . NO PAST SURGERIES    . PERIPHERAL VASCULAR CATHETERIZATION N/A 10/25/2015   Procedure: Dialysis/Perma Catheter Insertion;  Surgeon: Katha Cabal, MD;  Location: [facility];  Service: Cardiovascular;  Laterality: N/A;  . PERIPHERAL VASCULAR CATHETERIZATION Left 02/22/2016   Procedure: A/V Shuntogram/Fistulagram;  Surgeon: Algernon Huxley, MD;  Location: [facility];  Service: Cardiovascular;  Laterality: Left;  . PERIPHERAL VASCULAR CATHETERIZATION Right 03/19/2016   Procedure: Dialysis/Perma Catheter Insertion;  Surgeon: Katha Cabal, MD;  Location: [facility];  Service: Cardiovascular;  Laterality: Right;  . PERIPHERAL VASCULAR CATHETERIZATION N/A 04/22/2016   Procedure: Dialysis/Perma Catheter Removal;  Surgeon: Algernon Huxley, MD;  Location: [facility];  Service: Cardiovascular;  Laterality: N/A;       Home Medications    Prior to Admission medications   Medication Sig Start Date End Date Taking? Authorizing Provider  acetaminophen (TYLENOL) 325 MG tablet Take 650 mg by mouth every 6 (six) hours as needed for mild pain, fever or headache.    Historical Provider, MD  amiodarone (PACERONE) 400 MG tablet Take 1 tablet (400 mg total) by mouth daily. 12/14/15   Hillary Bow, MD  apixaban (ELIQUIS) 5 MG TABS tablet Take 1-2 mg by mouth 2 (two) times daily.     Historical Provider, MD  aspirin 81 MG chewable tablet Chew 1 tablet (81 mg  total) by mouth daily. 06/02/15   Maryellen Pile, MD  b complex-vitamin c-folic acid (NEPHRO-VITE) 0.8 MG TABS tablet Take 1 tablet by mouth 2 (two) times daily.    Historical Provider, MD  capsaicin (ZOSTRIX) 0.025 % cream Apply 1 application topically 2 (two) times daily as needed (for lower back pain).    Historical Provider, MD  ipratropium-albuterol (DUONEB) 0.5-2.5 (3) MG/3ML SOLN Take 3 mLs by nebulization every 6 (six) hours as  needed (for wheezing/shortness of breath).    Historical Provider, MD  lamoTRIgine (LAMICTAL) 25 MG tablet Take 50 mg by mouth daily.    Historical Provider, MD  levothyroxine (SYNTHROID, LEVOTHROID) 50 MCG tablet Take 50 mcg by mouth daily before breakfast.    Historical Provider, MD  loratadine (CLARITIN) 10 MG tablet Take 1 tablet (10 mg total) by mouth daily. 05/24/15   Gerlene Fee, NP  metoprolol tartrate (LOPRESSOR) 25 MG tablet Take 1 tablet (25 mg total) by mouth 2 (two) times daily. 12/14/15   Srikar Sudini, MD  mirtazapine (REMERON) 15 MG tablet Take 15 mg by mouth at bedtime.    Historical Provider, MD  polyethylene glycol (MIRALAX / GLYCOLAX) packet Take 17 g by mouth daily as needed for mild constipation.     Historical Provider, MD  sevelamer (RENAGEL) 800 MG tablet Take 2,400 mg by mouth 3 (three) times daily with meals.    Historical Provider, MD  Skin Protectants, Misc. (EUCERIN) cream Apply 1 application topically 2 (two) times daily.    Historical Provider, MD  Travoprost, BAK Free, (TRAVATAN) 0.004 % SOLN ophthalmic solution Place 1 drop into both eyes at bedtime.    Historical Provider, MD    Family History Family History  Problem Relation Age of Onset  . Hypertension Other   . Diabetes Other     Social History Social History  Substance Use Topics  . Smoking status: Never Smoker  . Smokeless tobacco: Never Used  . Alcohol use No     Allergies   Penicillins   Review of Systems Review of Systems  Constitutional: Negative.   HENT: Negative.   Respiratory: Positive for shortness of breath.   Cardiovascular: Negative.   Gastrointestinal: Negative.   Genitourinary:       Hemodialysis patient  Musculoskeletal: Negative.   Skin: Negative.   Allergic/Immunologic: Positive for immunocompromised state.       Hemodialysis patient  Neurological: Negative.   Psychiatric/Behavioral: Negative.   All other systems reviewed and are negative.    Physical  Exam Updated Vital Signs BP (!) 162/119 (BP Location: Right Arm)   Pulse 90   Temp 98 F (36.7 C)   Resp 24   SpO2 98%   Physical Exam  Constitutional: He appears well-developed and well-nourished. No distress.  HENT:  Head: Normocephalic and atraumatic.  Eyes: Conjunctivae are normal. Pupils are equal, round, and reactive to light.  Neck: Neck supple. No tracheal deviation present. No thyromegaly present.  Cardiovascular: Normal rate and regular rhythm.   No murmur heard. Pulmonary/Chest: Effort normal and breath sounds normal.  Diffuse rhonchi  Abdominal: Soft. Bowel sounds are normal. He exhibits no distension. There is no tenderness.  Musculoskeletal: Normal range of motion. He exhibits no edema or tenderness.  Left upper extremity with dialysis fistula with good thrill  Neurological: He is alert. Coordination normal.  Skin: Skin is warm and dry. No rash noted.  Psychiatric: He has a normal mood and affect.  Nursing note and vitals reviewed.   Chest reviewed  by me ED Treatments / Results  Labs (all labs ordered are listed, but only abnormal results are displayed) Labs Reviewed  I-STAT CHEM 8, ED    EKG  EKG Interpretation  Date/Time:  Thursday June 13 2016 11:35:14 EDT Ventricular Rate:  81 PR Interval:    QRS Duration: 79 QT Interval:  403 QTC Calculation: 468 R Axis:   45 Text Interpretation:  Atrial fibrillation Borderline low voltage, extremity leads Borderline repolarization abnormality No significant change since last tracing Confirmed by Winfred Leeds  MD, Dali Kraner [phone]) on 06/13/2016 12:01:17 PM       Radiology No results found.  Procedures Procedures (including critical care time) 12:30 PM patient sleeping easily arousable. No wrist or distress Chest x-ray viewed by me Results for orders placed or performed during the hospital encounter of 06/13/16  I-stat chem 8, ed  Result Value Ref Range   Sodium 138 135 - 145 mmol/L   Potassium 5.8 (H) 3.5  - 5.1 mmol/L   Chloride 99 (L) 101 - 111 mmol/L   BUN 84 (H) 6 - 20 mg/dL   Creatinine, Ser >18.00 (H) 0.61 - 1.24 mg/dL   Glucose, Bld 83 65 - 99 mg/dL   Calcium, Ion 0.74 (LL) 1.15 - 1.40 mmol/L   TCO2 27 0 - 100 mmol/L   Hemoglobin 12.2 (L) 13.0 - 17.0 g/dL   HCT 36.0 (L) 39.0 - 52.0 %   Comment NOTIFIED PHYSICIAN    Dg Chest Port 1 View  Result Date: 06/13/2016 CLINICAL DATA:  Shortness of breath, wheezing, gurgling EXAM: PORTABLE CHEST 1 VIEW COMPARISON:  05/27/2016 FINDINGS: There is bilateral diffuse mild interstitial thickening. There is no focal parenchymal opacity. There is no pleural effusion or pneumothorax. There is stable cardiomegaly. The osseous structures are unremarkable. IMPRESSION: Findings concerning for mild pulmonary edema. Electronically Signed   By: Kathreen Devoid   On: 06/13/2016 11:51   Dg Chest Portable 1 View  Result Date: 05/27/2016 CLINICAL DATA:  Shortness of breath since last evening. Missed recent dialysis. EXAM: PORTABLE CHEST 1 VIEW COMPARISON:  03/20/2016 FINDINGS: The heart is enlarged but stable. Stable prominent mediastinal and hilar contours. Moderate interstitial pulmonary edema but no pleural effusions. IMPRESSION: Stable cardiac enlargement and moderate interstitial pulmonary edema. Electronically Signed   By: Marijo Sanes M.D.   On: 05/27/2016 11:31   Medications Ordered in ED Medications - No data to display Dr Jonnie Finner from nephrology service consulted and will arrange for urgent hemodialysis Initial Impression / Assessment and Plan / ED Course  I have reviewed the triage vital signs and the nursing notes.  Pertinent labs & imaging results that were available during my care of the patient were reviewed by me and considered in my medical decision making (see chart for details).  Clinical Course      Final Clinical Impressions(s) / ED Diagnoses  Diagnoses #1 pulmonary edema #2 hyperkalemia Final diagnoses:  None  CRITICAL CARE Performed  by: Orlie Dakin Total critical care time: 30 minutes Critical care time was exclusive of separately billable procedures and treating other patients. Critical care was necessary to treat or prevent imminent or life-threatening deterioration. Critical care was time spent personally by me on the following activities: development of treatment plan with patient and/or surrogate as well as nursing, discussions with consultants, evaluation of patient's response to treatment, examination of patient, obtaining history from patient or surrogate, ordering and performing treatments and interventions, ordering and review of laboratory studies, ordering and review of radiographic studies, pulse oximetry and  re-evaluation of patient's condition.  New Prescriptions New Prescriptions   No medications on file     Orlie Dakin, MD 06/13/16 [place], MD 06/13/16 1240

## 2016-06-13 NOTE — Procedures (Signed)
Asked to do HD for this patient who missed last HD at OP unit.  Here w cough and SOB, not hypoxemic.  CXR w +pulm edema. Pt went to last HD on Tuesday but left w/o treatment. Not sure of details.  On exam pt has chronic facial edema (SVC syndrome-like), clear lungs and no LE edema, chronic LUE edema.  abd benign, HOH, alert and Ox 3.  Cr > 18, K ok.  Plan 4 hr HD now, then ok to dc home and f/u for next OP HD on Sat.      I was present at this dialysis session, have reviewed the session itself and made  appropriate changes Kelly Splinter MD [place] pager [phone]    cell [phone] 06/13/2016, 1:43 PM

## 2016-06-18 ENCOUNTER — Non-Acute Institutional Stay (SKILLED_NURSING_FACILITY): Payer: Medicaid Other | Admitting: Internal Medicine

## 2016-06-18 DIAGNOSIS — I482 Chronic atrial fibrillation, unspecified: Secondary | ICD-10-CM

## 2016-06-18 DIAGNOSIS — I10 Essential (primary) hypertension: Secondary | ICD-10-CM

## 2016-06-18 DIAGNOSIS — J449 Chronic obstructive pulmonary disease, unspecified: Secondary | ICD-10-CM

## 2016-06-18 DIAGNOSIS — E032 Hypothyroidism due to medicaments and other exogenous substances: Secondary | ICD-10-CM

## 2016-06-18 DIAGNOSIS — N186 End stage renal disease: Secondary | ICD-10-CM | POA: Diagnosis not present

## 2016-06-18 DIAGNOSIS — J810 Acute pulmonary edema: Secondary | ICD-10-CM | POA: Diagnosis not present

## 2016-06-18 DIAGNOSIS — Z992 Dependence on renal dialysis: Secondary | ICD-10-CM | POA: Diagnosis not present

## 2016-06-18 DIAGNOSIS — E038 Other specified hypothyroidism: Secondary | ICD-10-CM

## 2016-06-18 DIAGNOSIS — T462X1A Poisoning by other antidysrhythmic drugs, accidental (unintentional), initial encounter: Secondary | ICD-10-CM

## 2016-06-18 NOTE — Progress Notes (Signed)
This is a routine visit.    level care skilled.  [place]  Chief complaint-medical management of chronic medical conditions including end-stage renal disease on hemodialysis-history DVT upper extremity-hypertension-chronic low back pain-allergies-COPD-diabetes type 2-depression-atrial fibrillation.  History of present illness.  Patient is a 65 year old male with the above diagnoses-most recent acute issue last week was shortness of breath with hypoxiashe was sent to the ER and thought to have pulmonary edema and received apparently urgent hemodialysis and return to the facility.  Since then his stay as been fairly unremarkable did see him about week ago for possible t seizure the night before-apparently his eyes rolled back he was somewhat less responsive but apparently this was of short duration and he refused to go to the ER for evaluation-there've been no further episodes apparently.  He did receive regularly scheduled dialysis today he's return in the facility he does not have any complaints s is resting in bed comfortably appears tired but certainly no sign of any distress here.  His other medical issues appear to be relatively stable he does have a history of atrial fibrillation he is on amiodarone as well as metoprolol for rate control TSH has been elevated most recently 16.6 to on lab done on September 20 he had been started on Synthroid 50 g a day last month we will increase this.  He is on Eliquist for rate control. Past Medical History:  Diagnosis Date  . Arthritis   . Asthma   . Cancer ([place])   . COPD (chronic obstructive pulmonary disease) ([place])   . Coronary artery disease   . Diabetes mellitus without complication ([place])   . Hypertension   . Renal disorder         Past Surgical History:  Procedure Laterality Date  . INSERTION OF DIALYSIS CATHETER    . INSERTION OF DIALYSIS CATHETER N/A 05/29/2015   Procedure: INSERTION OF DIALYSIS  CATHETER;  Surgeon: Angelia Mould, MD;  Location: [facility];  Service: Vascular;  Laterality: N/A;  . NO PAST SURGERIES    . PERIPHERAL VASCULAR CATHETERIZATION N/A 10/25/2015   Procedure: Dialysis/Perma Catheter Insertion;  Surgeon: Katha Cabal, MD;  Location: [facility];  Service: Cardiovascular;  Laterality: N/A;  . PERIPHERAL VASCULAR CATHETERIZATION Left 02/22/2016   Procedure: A/V Shuntogram/Fistulagram;  Surgeon: Algernon Huxley, MD;  Location: [facility];  Service: Cardiovascular;  Laterality: Left;  . PERIPHERAL VASCULAR CATHETERIZATION Right 03/19/2016   Procedure: Dialysis/Perma Catheter Insertion;  Surgeon: Katha Cabal, MD;  Location: [facility];  Service: Cardiovascular;  Laterality: Right;  . PERIPHERAL VASCULAR CATHETERIZATION N/A 04/22/2016   Procedure: Dialysis/Perma Catheter Removal;  Surgeon: Algernon Huxley, MD;  Location: [facility];  Service: Cardiovascular;  Laterality: N/A;         Allergies  Allergen Reactions  . Penicillins Other (See Comments)    Reaction:  Unknown           Medication List                      acetaminophen 325 MG tablet Commonly known as:  TYLENOL Take 650 mg by mouth every 6 (six) hours as needed for mild pain, fever or headache.  amiodarone 400 MG tablet Commonly known as:  PACERONE Take 1 tablet (400 mg total) by mouth daily.  aspirin 81 MG chewable tablet Chew 1 tablet (81 mg total) by mouth daily.  b complex-vitamin c-folic acid  0.8 MG Tabs tablet Take 1 tablet by mouth 2 (two) times daily.  capsaicin 0.025 % cream Commonly known as:  ZOSTRIX Apply 1 application topically 2 (two) times daily as needed (for lower back pain).  ELIQUIS 5 MG Tabs tablet Generic drug:  apixaban Take 1-2 mg by mouth 2 (two) times daily.  eucerin cream Apply 1 application topically 2 (two) times daily.  ipratropium-albuterol 0.5-2.5 (3) MG/3ML Soln Commonly known as:  DUONEB Take  3 mLs by nebulization every 6 (six) hours as needed (for wheezing/shortness of breath).  lamoTRIgine 25 MG tablet Commonly known as:  LAMICTAL Take 50 mg by mouth daily.  levothyroxine 50 MCG tablet Commonly known as:  SYNTHROID, LEVOTHROID Take 50 mcg by mouth daily before breakfast.  loratadine 10 MG tablet Commonly known as:  CLARITIN Take 1 tablet (10 mg total) by mouth daily.  metoprolol tartrate 25 MG tablet Commonly known as:  LOPRESSOR Take 1 tablet (25 mg total) by mouth 2 (two) times daily.  mirtazapine 15 MG tablet Commonly known as:  REMERON Take 15 mg by mouth at bedtime.  polyethylene glycol packet Commonly known as:  MIRALAX / GLYCOLAX Take 17 g by mouth daily as needed for mild constipation.  sevelamer 800 MG tablet Commonly known as:  RENAGEL Take 2,400 mg by mouth 3 (three) times daily with meals.  Travoprost (BAK Free) 0.004 % Soln ophthalmic solution Commonly known as:  TRAVATAN Place 1 drop into both eyes at bedtime.      Review of Systems  Constitutional: Negative for appetite change does have fatigue status post dialysis.  Respiratory: Negative for cough, chest tightness and shortness of breath.  Cardiovascular: Negative for chest pain and leg swelling.  Gastrointestinal: Negative for abdominal pain and constipation.  Musculoskeletal: Negative for myalgias and arthralgias.  Skin: Negative for pallor.  Neurologic is not complaining of headache dizziness syncopal-type feelings or further question seizure activity Psychiatric/Behavioral: The patient is not nervous/anxious. He does not speak much      Immunization History  Administered Date(s) Administered  . PPD Test 05/01/2016, 05/01/2016, 05/01/2016       Pertinent  Health Maintenance Due  Topic Date Due  . FOOT EXAM  07/08/1961  . OPHTHALMOLOGY EXAM  07/08/1961  . URINE MICROALBUMIN  07/08/1961  . COLONOSCOPY  07/08/2001  . HEMOGLOBIN A1C  10/29/2015  . INFLUENZA VACCINE  06/19/2016  (Originally 04/23/2016)   No flowsheet data found. Functional Status Survey:  Physical exam Temperature 97.8 pulse 78 respirations 24 blood pressure 148/80-weight is 200.8 pounds  Constitutional: No distress. Lying in bed appears comfortable  Overweight  Eyes: Conjunctivae are. Icteric Neck: Neck supple. No JVD present. No thyromegaly present.  Cardiovascular: Normal rate, regular rhythm  with distant heart sounds.  Respiratory: Effort normal and breath sounds normal. No respiratory distress. He has no wheezes.  GI: Soft. Bowel sounds are normal. He exhibits no distension. There is no tenderness.  Musculoskeletal: He exhibits minimal edema Neurologic cannot appreciate any lateralizing findings cranial nerves appear to be grossly intact  Able to move all extremities       Neurological: He is alert.  Skin: Skin is warm and dry. He is not diaphoretic.   shunt with  bruit on left upper arm --areas covered with dressing  Psychiatric: He has a normal mood and affect.   Labs.  06/13/2016.  Sodium 138 potassium 5.8 BUN 84 creatinine greater than 18.  Hemoglobin 12.2.  TSH-as noted in history of present illness  Recent Labs  09/21/15 1437  12/05/15 1507  12/10/15 0702  12/12/15 0729 12/15/15 1620 03/16/16 2350 05/03/16 05/27/16 1117  NA  --   < > 133*  < > 130*  < >  --  132* 141 141 139  K  --   < > 3.8  < > 4.6  --  4.1 5.7* 4.8 5.0 4.6  CL  --   < > 95*  < > 94*  --   --  96* 104  --  99*  CO2  --   < > 26  < > 21*  --   --  23 21*  --   --   GLUCOSE  --   < > 107*  < > 84  --   --  105* 117*  --  85  BUN  --   < > 63*  < > 66*  --   --  87* 88* 98* 58*  CREATININE  --   < > 8.39*  < > 9.71*  --   --  9.23* 14.43* 15.8* 16.50*  CALCIUM  --   < > 8.1*  < > 7.5*  --   --  7.5* 8.5*  --   --   MG  --   --   --   --  2.6*  --  2.3  --   --   --   --   PHOS 8.4*  --  3.6  --   --   --   --  6.7*  --   --   --   < > = values in this interval not displayed.     Recent Labs (within last 365 days)   Recent Labs  12/04/15 1544 12/05/15 1507 12/15/15 1620 03/16/16 2350  AST 33  --   --  33  ALT 24  --   --  34  ALKPHOS 84  --   --  127*  BILITOT 0.8  --   --  0.1*  PROT 7.7  --   --  7.4  ALBUMIN 3.4* 3.2* 2.5* 3.7      Recent Labs (within last 365 days)   Recent Labs  12/14/15 0649 12/15/15 1620 03/16/16 2350 05/03/16 05/27/16 1117  WBC 11.9* 12.3* 8.2 6.9  --   NEUTROABS  --  9.9*  --   --   --   HGB 8.5* 8.5* 10.7* 10.3* 11.9*  HCT 25.9* 26.0* 33.1* 33* 35.0*  MCV 86.0 84.4 90.1  --   --   PLT 297 364 217 227  --      Recent Labs       Lab Results  Component Value Date   TSH 16.21 (A) 05/03/2016     Recent Labs  Lab Results  Component Value Date   HGBA1C 5.7 (H) 04/28/2015     Recent Labs   Assessment and plan. 1. ESRD: on hemodialysis: will continue hemodialysis three days per week ;will continue renagel 2400 mg  with each meals; will continue to monitor his status.   2. 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 Hypertension: will continue lopressor 25 mg twice daily  will monitor has variable blood pressures but I suspect this is secondary to his volume status with history of dialysis-  4. Chronic low back pain: will use capsaicin cream to his lower back twice daily  as needed will monitor   5. Seasonal allergies: will continue claritin 10 mg daily   6. COPD: will continue duoneb every 6 hours as needed . Will not make changes will monitor appears stable recent hypoxia was thought secondary to volume overload  7. Diabetes is presently not on medications; his hgb a1c is 5.7  8. 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 Will not make changes will monitor  9. Depression: will continue remeron 15 mg nightly   10. Constipation: will continue miralax daily as needed   11. Afib: will continue  eliquis 5 mg twice daily and will continue amiodarone 400 mg daily for rate control   #12 question history seizure-no recent seizure activity per nursing at this point will monitor.  #13 history hypothyroidism TSH is elevated will increase Synthroid to 75 g a day and recheck this in 6 weeks   [id number]-of note greater than 35 minutes spent assessing patient-discussing his status with nursing staff-reviewing his chart-his ER reports-and coordinating formulating a plan of care for numerous diagnoses-of note greater than 50% of time spent coordinating plan of care

## 2016-06-19 ENCOUNTER — Encounter: Payer: Self-pay | Admitting: Internal Medicine

## 2016-06-20 ENCOUNTER — Non-Acute Institutional Stay (SKILLED_NURSING_FACILITY): Payer: Medicaid Other | Admitting: Internal Medicine

## 2016-06-20 ENCOUNTER — Encounter: Payer: Self-pay | Admitting: Internal Medicine

## 2016-06-20 DIAGNOSIS — I871 Compression of vein: Secondary | ICD-10-CM | POA: Diagnosis not present

## 2016-06-20 DIAGNOSIS — I82722 Chronic embolism and thrombosis of deep veins of left upper extremity: Secondary | ICD-10-CM | POA: Diagnosis not present

## 2016-06-20 DIAGNOSIS — L03114 Cellulitis of left upper limb: Secondary | ICD-10-CM | POA: Diagnosis not present

## 2016-06-20 DIAGNOSIS — Z992 Dependence on renal dialysis: Secondary | ICD-10-CM | POA: Diagnosis not present

## 2016-06-20 DIAGNOSIS — N186 End stage renal disease: Secondary | ICD-10-CM | POA: Diagnosis not present

## 2016-06-20 NOTE — Progress Notes (Signed)
Patient ID: Prescott Mcconnell, male   DOB: 06/02/1951, 65 y.o.   MRN: [id number]    DATE:  06/20/2016  Location:    [facility] Room Number: [id number] Place of Service: SNF (31)   Extended Emergency Contact Information Primary Emergency Contact: Hayes,Barbara Address: [place], [place] [address] [place] Phone: [phone] Relation: Mother Secondary Emergency Contact: Hayes,[name] Address: [address]          [place], [place] [address] [place] Phone: [phone] Relation: Brother  Advanced Directive information Does patient have an advance directive?: Yes, Does patient want to make changes to advanced directive?: No - Patient declined  Chief Complaint  Patient presents with  . Arm Swelling    arm swelling     HPI:  65 yo male long term resident seen today for acute visit. Pt with increased swelling in arm with associated pain radiating into chest. He refused to go to HD today. He has a hx chronic LUE DVT and is on eliquis. He has SVC syndrome and nursing noted increased swelling in face. He is ESRD/HD TThSa and misses several tx per month. CBG 142 today. No f/c. WBC 6.3K last week.   Past Medical History:  Diagnosis Date  . Arthritis   . Asthma   . Cancer ([place])   . COPD (chronic obstructive pulmonary disease) ([place])   . Coronary artery disease   . Diabetes mellitus without complication ([place])   . Hypertension   . Renal disorder     Past Surgical History:  Procedure Laterality Date  . INSERTION OF DIALYSIS CATHETER    . INSERTION OF DIALYSIS CATHETER N/A 05/29/2015   Procedure: INSERTION OF DIALYSIS CATHETER;  Surgeon: Angelia Mould, MD;  Location: [facility];  Service: Vascular;  Laterality: N/A;  . NO PAST SURGERIES    . PERIPHERAL VASCULAR CATHETERIZATION N/A 10/25/2015   Procedure: Dialysis/Perma Catheter Insertion;  Surgeon: Katha Cabal, MD;  Location: [facility];  Service:  Cardiovascular;  Laterality: N/A;  . PERIPHERAL VASCULAR CATHETERIZATION Left 02/22/2016   Procedure: A/V Shuntogram/Fistulagram;  Surgeon: Algernon Huxley, MD;  Location: [facility];  Service: Cardiovascular;  Laterality: Left;  . PERIPHERAL VASCULAR CATHETERIZATION Right 03/19/2016   Procedure: Dialysis/Perma Catheter Insertion;  Surgeon: Katha Cabal, MD;  Location: [facility];  Service: Cardiovascular;  Laterality: Right;  . PERIPHERAL VASCULAR CATHETERIZATION N/A 04/22/2016   Procedure: Dialysis/Perma Catheter Removal;  Surgeon: Algernon Huxley, MD;  Location: [facility];  Service: Cardiovascular;  Laterality: N/A;    Patient Care Team: Theotis Burrow, MD as PCP - General (Family Medicine)  Social History   Social History  . Marital status: Legally Separated    Spouse name: N/A  . Number of children: N/A  . Years of education: N/A   Occupational History  . Not on file.   Social History Main Topics  . Smoking status: Never Smoker  . Smokeless tobacco: Never Used  . Alcohol use No  . Drug use: No  . Sexual activity: Not on file   Other Topics Concern  . Not on file   Social History Narrative  . No narrative on file     reports that he has never smoked. He has never used smokeless tobacco. He reports that he does not drink alcohol or use drugs.  Family History  Problem Relation Age of Onset  . Hypertension Other   . Diabetes Other    Family Status  Relation Status  . Mother Alive  . Father Deceased  . Other     Immunization History  Administered Date(s) Administered  . PPD Test 05/01/2016, 05/01/2016, 05/01/2016    Allergies  Allergen Reactions  . Penicillins Other (See Comments)    Reaction:  Unknown     Medications: Patient's Medications  New Prescriptions   No medications on file  Previous Medications   ACETAMINOPHEN (TYLENOL) 325 MG TABLET    Take 650 mg by mouth every 6 (six) hours as needed for mild pain, fever  or headache.   AMIODARONE (PACERONE) 400 MG TABLET    Take 1 tablet (400 mg total) by mouth daily.   APIXABAN (ELIQUIS) 5 MG TABS TABLET    Take 5 mg by mouth 2 (two) times daily.    ASPIRIN 81 MG CHEWABLE TABLET    Chew 1 tablet (81 mg total) by mouth daily.   B COMPLEX-VITAMIN C-FOLIC ACID (NEPHRO-VITE) 0.8 MG TABS TABLET    Take 1 tablet by mouth 2 (two) times daily.   CAPSAICIN (ZOSTRIX) 0.025 % CREAM    Apply 1 application topically 2 (two) times daily as needed (for lower back pain).   IPRATROPIUM-ALBUTEROL (DUONEB) 0.5-2.5 (3) MG/3ML SOLN    Take 3 mLs by nebulization every 6 (six) hours as needed (for wheezing/shortness of breath).   LAMOTRIGINE (LAMICTAL) 25 MG TABLET    Take 50 mg by mouth daily.   LEVOTHYROXINE (SYNTHROID, LEVOTHROID) 75 MCG TABLET    Take 75 mcg by mouth daily before breakfast.    LORATADINE (CLARITIN) 10 MG TABLET    Take 1 tablet (10 mg total) by mouth daily.   METOPROLOL TARTRATE (LOPRESSOR) 25 MG TABLET    Take 1 tablet (25 mg total) by mouth 2 (two) times daily.   MIRTAZAPINE (REMERON) 15 MG TABLET    Take 15 mg by mouth at bedtime.   POLYETHYLENE GLYCOL (MIRALAX / GLYCOLAX) PACKET    Take 17 g by mouth daily as needed for mild constipation.    SEVELAMER (RENAGEL) 800 MG TABLET    Take 2,400 mg by mouth 3 (three) times daily with meals.   TRAVOPROST, BAK FREE, (TRAVATAN) 0.004 % SOLN OPHTHALMIC SOLUTION    Place 1 drop into both eyes at bedtime.  Modified Medications   No medications on file  Discontinued Medications   No medications on file    Review of Systems  Musculoskeletal: Positive for arthralgias.  Skin: Positive for rash.  All other systems reviewed and are negative.   Vitals:   06/20/16 1201  BP: 132/90  Pulse: 100  Resp: 18  Temp: 98.2 F (36.8 C)  TempSrc: Oral  SpO2: (!) 82%  Weight: 201 lb 9.6 oz (91.4 kg)  Height: 5\' 7"  (1.702 m)   Body mass index is 31.58 kg/m.  Physical Exam  Constitutional: He is oriented to person, place,  and time. He appears well-developed and well-nourished. No distress.  Sitting in w/c in NAD  Cardiovascular:  Left arm AVF with palpable thrill/audible bruit  Musculoskeletal: He exhibits edema and tenderness.  Neurological: He is alert and oriented to person, place, and time.  Skin: Skin is warm and dry. No rash noted. There is erythema.  No rash. He has LUE increased warmth/swelling and TTP on posterior medial arm. No d/c. (+) palpable TTP lumps in arm  Psychiatric: He has a normal mood  and affect. His behavior is normal. Thought content normal.     Labs reviewed: Nursing Home on 06/20/2016  Component Date Value Ref Range Status  . Hemoglobin 06/12/2016 10.9* 13.5 - 17.5 g/dL Final  . HCT 06/12/2016 34* 41 - 53 % Final  . Platelets 06/12/2016 184  150 - 399 K/L Final  . WBC 06/12/2016 6.3  10^3/mL Final  . Glucose 06/12/2016 82  mg/dL Final  . BUN 06/12/2016 60* 4 - 21 mg/dL Final  . Creatinine 06/12/2016 15.0* 0.6 - 1.3 mg/dL Final  . Potassium 06/12/2016 5.4* 3.4 - 5.3 mmol/L Final  . Sodium 06/12/2016 142  137 - 147 mmol/L Final  . Alkaline Phosphatase 06/12/2016 113  25 - 125 U/L Final  . ALT 06/12/2016 16  10 - 40 U/L Final  . AST 06/12/2016 15  14 - 40 U/L Final  . Bilirubin, Total 06/12/2016 0.6  mg/dL Final  . TSH 06/12/2016 16.62* 0.41 - 5.90 uIU/mL Final  Admission on 06/13/2016, Discharged on 06/13/2016  Component Date Value Ref Range Status  . Sodium 06/13/2016 138  135 - 145 mmol/L Final  . Potassium 06/13/2016 5.8* 3.5 - 5.1 mmol/L Final  . Chloride 06/13/2016 99* 101 - 111 mmol/L Final  . BUN 06/13/2016 84* 6 - 20 mg/dL Final  . Creatinine, Ser 06/13/2016 >18.00* 0.61 - 1.24 mg/dL Final  . Glucose, Bld 06/13/2016 83  65 - 99 mg/dL Final  . Calcium, Ion 06/13/2016 0.74* 1.15 - 1.40 mmol/L Final  . TCO2 06/13/2016 27  0 - 100 mmol/L Final  . Hemoglobin 06/13/2016 12.2* 13.0 - 17.0 g/dL Final  . HCT 06/13/2016 36.0* 39.0 - 52.0 % Final  . Comment 06/13/2016  NOTIFIED PHYSICIAN   Final  Admission on 05/27/2016, Discharged on 05/27/2016  Component Date Value Ref Range Status  . Sodium 05/27/2016 139  135 - 145 mmol/L Final  . Potassium 05/27/2016 4.6  3.5 - 5.1 mmol/L Final  . Chloride 05/27/2016 99* 101 - 111 mmol/L Final  . BUN 05/27/2016 58* 6 - 20 mg/dL Final  . Creatinine, Ser 05/27/2016 16.50* 0.61 - 1.24 mg/dL Final  . Glucose, Bld 05/27/2016 85  65 - 99 mg/dL Final  . Calcium, Ion 05/27/2016 0.84* 1.15 - 1.40 mmol/L Final  . TCO2 05/27/2016 27  0 - 100 mmol/L Final  . Hemoglobin 05/27/2016 11.9* 13.0 - 17.0 g/dL Final  . HCT 05/27/2016 35.0* 39.0 - 52.0 % Final  Nursing Home on 05/20/2016  Component Date Value Ref Range Status  . Hemoglobin 05/03/2016 10.3* 13.5 - 17.5 g/dL Final  . HCT 05/03/2016 33* 41 - 53 % Final  . Platelets 05/03/2016 227  150 - 399 K/L Final  . WBC 05/03/2016 6.9  10^3/mL Final  . Glucose 05/03/2016 140  mg/dL Final  . BUN 05/03/2016 98* 4 - 21 mg/dL Final  . Creatinine 05/03/2016 15.8* 0.6 - 1.3 mg/dL Final  . Potassium 05/03/2016 5.0  3.4 - 5.3 mmol/L Final  . Sodium 05/03/2016 141  137 - 147 mmol/L Final  . Triglycerides 05/03/2016 77  40 - 160 mg/dL Final  . Cholesterol 05/03/2016 101  0 - 200 mg/dL Final  . HDL 05/03/2016 51  35 - 70 mg/dL Final  . LDL Cholesterol 05/03/2016 35  mg/dL Final  . TSH 05/03/2016 16.21* 0.41 - 5.90 uIU/mL Final    Dg Chest Port 1 View  Result Date: 06/13/2016 CLINICAL DATA:  Shortness of breath, wheezing, gurgling EXAM: PORTABLE CHEST 1 VIEW COMPARISON:  05/27/2016 FINDINGS:  There is bilateral diffuse mild interstitial thickening. There is no focal parenchymal opacity. There is no pleural effusion or pneumothorax. There is stable cardiomegaly. The osseous structures are unremarkable. IMPRESSION: Findings concerning for mild pulmonary edema. Electronically Signed   By: Kathreen Devoid   On: 06/13/2016 11:51   Dg Chest Portable 1 View  Result Date: 05/27/2016 CLINICAL DATA:   Shortness of breath since last evening. Missed recent dialysis. EXAM: PORTABLE CHEST 1 VIEW COMPARISON:  03/20/2016 FINDINGS: The heart is enlarged but stable. Stable prominent mediastinal and hilar contours. Moderate interstitial pulmonary edema but no pleural effusions. IMPRESSION: Stable cardiac enlargement and moderate interstitial pulmonary edema. Electronically Signed   By: Marijo Sanes M.D.   On: 05/27/2016 11:31     Assessment/Plan   ICD-9-CM ICD-10-CM   1. Cellulitis of left upper arm 682.3 L03.114   2. 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 SVC syndrome 459.2 I87.1   4. ESRD on hemodialysis ([place]) 585.6 N18.6    V45.11 Z99.2      Due to his noncompliance with HD, he will need med that is independent of HD. Start Doxycycline 100mg  po BID x 10 days. Keep arm elevated for comfort  Highly recommend he resumes HD on a regular basis. Discussed potential implications of missing HD including repeat hospitalizations and death  Cont other meds as ordered  F/u with specialists as scheduled  Will follow  Saher Davee S. Perlie Gold  [facility] [address] [place], [place] [address] [phone] Cell (Monday-Friday 8 AM - 5 PM) [phone] After 5 PM and follow prompts

## 2016-06-24 ENCOUNTER — Encounter: Payer: Self-pay | Admitting: Internal Medicine

## 2016-07-11 ENCOUNTER — Encounter: Payer: Self-pay | Admitting: Vascular Surgery

## 2016-07-12 ENCOUNTER — Other Ambulatory Visit: Payer: Self-pay

## 2016-07-12 ENCOUNTER — Ambulatory Visit (INDEPENDENT_AMBULATORY_CARE_PROVIDER_SITE_OTHER): Payer: Medicare Other | Admitting: Vascular Surgery

## 2016-07-12 ENCOUNTER — Encounter: Payer: Self-pay | Admitting: Vascular Surgery

## 2016-07-12 VITALS — BP 167/97 | HR 80 | Temp 97.2°F | Resp 20 | Ht 67.0 in | Wt 191.0 lb

## 2016-07-12 DIAGNOSIS — N186 End stage renal disease: Secondary | ICD-10-CM | POA: Diagnosis not present

## 2016-07-12 DIAGNOSIS — Z992 Dependence on renal dialysis: Secondary | ICD-10-CM

## 2016-07-12 NOTE — Progress Notes (Signed)
Established Dialysis Access  History of Present Illness  Noah Lewis is a 65 y.o. (19-Jan-1951) male who presents for re-evaluation for bleeding from L [facility] AVF.  This patient has no idea when the L [facility] AVF was placed.  This patient has been managed by the [facility] vascular practice in recent time.  Pt was referred her with concerns with possible bleeding risk from erosions overlying the L BC AVF.  The patient has been dialysing from a femoral TDC.    Past Medical History:  Diagnosis Date  . Arthritis   . Asthma   . Cancer ([place])   . COPD (chronic obstructive pulmonary disease) ([place])   . Coronary artery disease   . Diabetes mellitus without complication ([place])   . Hypertension   . Renal disorder     Past Surgical History:  Procedure Laterality Date  . INSERTION OF DIALYSIS CATHETER    . INSERTION OF DIALYSIS CATHETER N/A 05/29/2015   Procedure: INSERTION OF DIALYSIS CATHETER;  Surgeon: Angelia Mould, MD;  Location: [facility];  Service: Vascular;  Laterality: N/A;  . NO PAST SURGERIES    . PERIPHERAL VASCULAR CATHETERIZATION N/A 10/25/2015   Procedure: Dialysis/Perma Catheter Insertion;  Surgeon: Katha Cabal, MD;  Location: [facility];  Service: Cardiovascular;  Laterality: N/A;  . PERIPHERAL VASCULAR CATHETERIZATION Left 02/22/2016   Procedure: A/V Shuntogram/Fistulagram;  Surgeon: Algernon Huxley, MD;  Location: [facility];  Service: Cardiovascular;  Laterality: Left;  . PERIPHERAL VASCULAR CATHETERIZATION Right 03/19/2016   Procedure: Dialysis/Perma Catheter Insertion;  Surgeon: Katha Cabal, MD;  Location: [facility];  Service: Cardiovascular;  Laterality: Right;  . PERIPHERAL VASCULAR CATHETERIZATION N/A 04/22/2016   Procedure: Dialysis/Perma Catheter Removal;  Surgeon: Algernon Huxley, MD;  Location: [facility];  Service: Cardiovascular;  Laterality: N/A;    Social History   Social History  . Marital status: Legally Separated   Spouse name: N/A  . Number of children: N/A  . Years of education: N/A   Occupational History  . Not on file.   Social History Main Topics  . Smoking status: Never Smoker  . Smokeless tobacco: Never Used  . Alcohol use No  . Drug use:     Types: Marijuana     Comment: Occasionally  . Sexual activity: Not on file   Other Topics Concern  . Not on file   Social History Narrative  . No narrative on file    Family History  Problem Relation Age of Onset  . Hypertension Other   . Diabetes Other     Current Outpatient Prescriptions  Medication Sig Dispense Refill  . acetaminophen (TYLENOL) 325 MG tablet Take 650 mg by mouth every 6 (six) hours as needed for mild pain, fever or headache.    Marland Kitchen amiodarone (PACERONE) 400 MG tablet Take 1 tablet (400 mg total) by mouth daily. 30 tablet 0  . apixaban (ELIQUIS) 5 MG TABS tablet Take 5 mg by mouth 2 (two) times daily.     Marland Kitchen aspirin 81 MG chewable tablet Chew 1 tablet (81 mg total) by mouth daily. 30 tablet 11  . b complex-vitamin c-folic acid (NEPHRO-VITE) 0.8 MG TABS tablet Take 1 tablet by mouth 2 (two) times daily.    . capsaicin (ZOSTRIX) 0.025 % cream Apply 1 application topically 2 (two) times daily as needed (for lower back pain).    Marland Kitchen ipratropium-albuterol (DUONEB) 0.5-2.5 (3) MG/3ML SOLN Take 3 mLs by nebulization every  6 (six) hours as needed (for wheezing/shortness of breath).    . lamoTRIgine (LAMICTAL) 25 MG tablet Take 50 mg by mouth daily.    Marland Kitchen levothyroxine (SYNTHROID, LEVOTHROID) 75 MCG tablet Take 75 mcg by mouth daily before breakfast.     . loratadine (CLARITIN) 10 MG tablet Take 1 tablet (10 mg total) by mouth daily. 30 tablet 11  . metoprolol tartrate (LOPRESSOR) 25 MG tablet Take 1 tablet (25 mg total) by mouth 2 (two) times daily.    . mirtazapine (REMERON) 15 MG tablet Take 15 mg by mouth at bedtime.    . polyethylene glycol (MIRALAX / GLYCOLAX) packet Take 17 g by mouth daily as needed for mild constipation.       . sevelamer (RENAGEL) 800 MG tablet Take 2,400 mg by mouth 3 (three) times daily with meals.    . Travoprost, BAK Free, (TRAVATAN) 0.004 % SOLN ophthalmic solution Place 1 drop into both eyes at bedtime.     No current facility-administered medications for this visit.      Allergies  Allergen Reactions  . Penicillins Other (See Comments)    Reaction:  Unknown      REVIEW OF SYSTEMS:  (Positives checked otherwise negative)  CARDIOVASCULAR:   [ ]  chest pain,  [ ]  chest pressure,  [ ]  palpitations,  [ ]  shortness of breath when laying flat,  [ ]  shortness of breath with exertion,   [ ]  pain in feet when walking,  [ ]  pain in feet when laying flat, [ ]  history of blood clot in veins (DVT),  [ ]  history of phlebitis,  [ ]  swelling in legs,  [ ]  varicose veins  PULMONARY:   [ ]  productive cough,  [ ]  asthma,  [ ]  wheezing  NEUROLOGIC:   [ ]  weakness in arms or legs,  [ ]  numbness in arms or legs,  [ ]  difficulty speaking or slurred speech,  [ ]  temporary loss of vision in one eye,  [ ]  dizziness  HEMATOLOGIC:   [ ]  bleeding problems,  [ ]  problems with blood clotting too easily  MUSCULOSKEL:   [ ]  joint pain, [ ]  joint swelling  GASTROINTEST:   [ ]  vomiting blood,  [ ]  blood in stool     GENITOURINARY:   [ ]  burning with urination,  [ ]  blood in urine [x]  end stage renal disease-HD: T/R/S   PSYCHIATRIC:   [ ]  history of major depression  INTEGUMENTARY:   [ ]  rashes,  [ ]  ulcers  CONSTITUTIONAL:   [ ]  fever,  [ ]  chills    Physical Examination  Vitals:   07/12/16 1024 07/12/16 1028  BP: (!) 163/102 (!) 167/97  Pulse: 80   Resp: 20   Temp: 97.2 F (36.2 C)   TempSrc: Oral   SpO2: 96%   Weight: 191 lb (86.6 kg)   Height: 5\' 7"  (1.702 m)    Body mass index is 29.91 kg/m.  General: A&O x 3, WD, unkempt  Pulmonary: Sym exp, good air movt, CTAB, no rales, rhonchi, & wheezing  Cardiac: RRR, Nl S1, S2, no Murmurs, rubs or  gallops  Vascular: Vessel Right Left  Radial Palpable Palpable  Ulnar Not Palpable Not Palpable  Brachial Palpable Palpable   Gastrointestinal: soft, NTND, no G/R, bo HSM, no masses, no CVAT B  Musculoskeletal: M/S 5/5 throughout , Extremities without  ischemic changes , no palpable thrill in access in LUA, pulsatile access, faint bruit in access at  elbow, no bruit proximally  Neurologic: Pain and light touch intact in extremities , Motor exam as listed above  IR Fistulogram (06/02/15): occluded L SCV stent   Medical Decision Making  Noah Lewis is a 65 y.o. male who presents with ESRD requiring hemodialysis.    Based on the prior fistulogram, I don't think this L BC AVF is salvageable, so I would proceed with ligation. Risk, benefits, and alternatives to access surgery were discussed.   The patient is aware the risks include but are not limited to: bleeding, infection, nerve damage, death and stroke.    The patient is aware this procedure will TERMINATE the left arm access.    The patient has agreed to proceed with the above procedure which will be scheduled 25 MAR 17 with Dr. Trula Slade.   Adele Barthel, MD, [facility] [place] Office: [phone] Pager: [phone]

## 2016-07-16 ENCOUNTER — Other Ambulatory Visit: Payer: Self-pay

## 2016-07-16 ENCOUNTER — Emergency Department (HOSPITAL_COMMUNITY): Payer: Medicare Other

## 2016-07-16 ENCOUNTER — Inpatient Hospital Stay (HOSPITAL_COMMUNITY)
Admission: EM | Admit: 2016-07-16 | Discharge: 2016-07-23 | DRG: 252 | Disposition: A | Discharge status: Skilled Nursing Facility | Payer: Medicare Other | Attending: Family Medicine | Admitting: Family Medicine

## 2016-07-16 ENCOUNTER — Non-Acute Institutional Stay (SKILLED_NURSING_FACILITY): Payer: Medicare Other | Admitting: Adult Health

## 2016-07-16 ENCOUNTER — Encounter: Payer: Self-pay | Admitting: Adult Health

## 2016-07-16 ENCOUNTER — Encounter (HOSPITAL_COMMUNITY): Payer: Self-pay | Admitting: Emergency Medicine

## 2016-07-16 DIAGNOSIS — N179 Acute kidney failure, unspecified: Secondary | ICD-10-CM | POA: Diagnosis not present

## 2016-07-16 DIAGNOSIS — D62 Acute posthemorrhagic anemia: Secondary | ICD-10-CM | POA: Diagnosis not present

## 2016-07-16 DIAGNOSIS — G4733 Obstructive sleep apnea (adult) (pediatric): Secondary | ICD-10-CM | POA: Diagnosis present

## 2016-07-16 DIAGNOSIS — I2510 Atherosclerotic heart disease of native coronary artery without angina pectoris: Secondary | ICD-10-CM | POA: Diagnosis not present

## 2016-07-16 DIAGNOSIS — R011 Cardiac murmur, unspecified: Secondary | ICD-10-CM | POA: Diagnosis not present

## 2016-07-16 DIAGNOSIS — I120 Hypertensive chronic kidney disease with stage 5 chronic kidney disease or end stage renal disease: Secondary | ICD-10-CM | POA: Diagnosis not present

## 2016-07-16 DIAGNOSIS — J810 Acute pulmonary edema: Secondary | ICD-10-CM

## 2016-07-16 DIAGNOSIS — E1122 Type 2 diabetes mellitus with diabetic chronic kidney disease: Secondary | ICD-10-CM | POA: Diagnosis present

## 2016-07-16 DIAGNOSIS — E8770 Fluid overload, unspecified: Secondary | ICD-10-CM | POA: Diagnosis present

## 2016-07-16 DIAGNOSIS — Z8249 Family history of ischemic heart disease and other diseases of the circulatory system: Secondary | ICD-10-CM | POA: Diagnosis not present

## 2016-07-16 DIAGNOSIS — I871 Compression of vein: Secondary | ICD-10-CM | POA: Diagnosis present

## 2016-07-16 DIAGNOSIS — Z833 Family history of diabetes mellitus: Secondary | ICD-10-CM

## 2016-07-16 DIAGNOSIS — E875 Hyperkalemia: Secondary | ICD-10-CM

## 2016-07-16 DIAGNOSIS — I150 Renovascular hypertension: Secondary | ICD-10-CM

## 2016-07-16 DIAGNOSIS — N186 End stage renal disease: Secondary | ICD-10-CM

## 2016-07-16 DIAGNOSIS — N189 Chronic kidney disease, unspecified: Secondary | ICD-10-CM | POA: Diagnosis not present

## 2016-07-16 DIAGNOSIS — I480 Paroxysmal atrial fibrillation: Secondary | ICD-10-CM | POA: Diagnosis not present

## 2016-07-16 DIAGNOSIS — Z8619 Personal history of other infectious and parasitic diseases: Secondary | ICD-10-CM

## 2016-07-16 DIAGNOSIS — I82B22 Chronic embolism and thrombosis of left subclavian vein: Secondary | ICD-10-CM | POA: Diagnosis not present

## 2016-07-16 DIAGNOSIS — E1151 Type 2 diabetes mellitus with diabetic peripheral angiopathy without gangrene: Secondary | ICD-10-CM | POA: Diagnosis present

## 2016-07-16 DIAGNOSIS — Z9115 Patient's noncompliance with renal dialysis: Secondary | ICD-10-CM

## 2016-07-16 DIAGNOSIS — M7989 Other specified soft tissue disorders: Secondary | ICD-10-CM

## 2016-07-16 DIAGNOSIS — Z7982 Long term (current) use of aspirin: Secondary | ICD-10-CM

## 2016-07-16 DIAGNOSIS — M79602 Pain in left arm: Secondary | ICD-10-CM | POA: Diagnosis present

## 2016-07-16 DIAGNOSIS — I5032 Chronic diastolic (congestive) heart failure: Secondary | ICD-10-CM | POA: Diagnosis not present

## 2016-07-16 DIAGNOSIS — I959 Hypotension, unspecified: Secondary | ICD-10-CM | POA: Diagnosis present

## 2016-07-16 DIAGNOSIS — Y92129 Unspecified place in nursing home as the place of occurrence of the external cause: Secondary | ICD-10-CM | POA: Diagnosis not present

## 2016-07-16 DIAGNOSIS — D899 Disorder involving the immune mechanism, unspecified: Secondary | ICD-10-CM | POA: Diagnosis not present

## 2016-07-16 DIAGNOSIS — Z992 Dependence on renal dialysis: Secondary | ICD-10-CM | POA: Diagnosis not present

## 2016-07-16 DIAGNOSIS — E669 Obesity, unspecified: Secondary | ICD-10-CM | POA: Diagnosis not present

## 2016-07-16 DIAGNOSIS — N2581 Secondary hyperparathyroidism of renal origin: Secondary | ICD-10-CM | POA: Diagnosis not present

## 2016-07-16 DIAGNOSIS — N185 Chronic kidney disease, stage 5: Secondary | ICD-10-CM | POA: Diagnosis not present

## 2016-07-16 DIAGNOSIS — F3130 Bipolar disorder, current episode depressed, mild or moderate severity, unspecified: Secondary | ICD-10-CM | POA: Diagnosis not present

## 2016-07-16 DIAGNOSIS — Z7901 Long term (current) use of anticoagulants: Secondary | ICD-10-CM | POA: Diagnosis not present

## 2016-07-16 DIAGNOSIS — G8929 Other chronic pain: Secondary | ICD-10-CM | POA: Diagnosis present

## 2016-07-16 DIAGNOSIS — F319 Bipolar disorder, unspecified: Secondary | ICD-10-CM | POA: Diagnosis present

## 2016-07-16 DIAGNOSIS — M545 Low back pain: Secondary | ICD-10-CM | POA: Diagnosis present

## 2016-07-16 DIAGNOSIS — T462X1A Poisoning by other antidysrhythmic drugs, accidental (unintentional), initial encounter: Secondary | ICD-10-CM

## 2016-07-16 DIAGNOSIS — I482 Chronic atrial fibrillation, unspecified: Secondary | ICD-10-CM

## 2016-07-16 DIAGNOSIS — J449 Chronic obstructive pulmonary disease, unspecified: Secondary | ICD-10-CM

## 2016-07-16 DIAGNOSIS — E032 Hypothyroidism due to medicaments and other exogenous substances: Secondary | ICD-10-CM | POA: Diagnosis not present

## 2016-07-16 DIAGNOSIS — I129 Hypertensive chronic kidney disease with stage 1 through stage 4 chronic kidney disease, or unspecified chronic kidney disease: Secondary | ICD-10-CM | POA: Diagnosis not present

## 2016-07-16 DIAGNOSIS — T82898A Other specified complication of vascular prosthetic devices, implants and grafts, initial encounter: Secondary | ICD-10-CM | POA: Diagnosis present

## 2016-07-16 DIAGNOSIS — I132 Hypertensive heart and chronic kidney disease with heart failure and with stage 5 chronic kidney disease, or end stage renal disease: Secondary | ICD-10-CM | POA: Diagnosis not present

## 2016-07-16 DIAGNOSIS — D638 Anemia in other chronic diseases classified elsewhere: Secondary | ICD-10-CM | POA: Diagnosis present

## 2016-07-16 DIAGNOSIS — E039 Hypothyroidism, unspecified: Secondary | ICD-10-CM | POA: Diagnosis present

## 2016-07-16 DIAGNOSIS — T829XXA Unspecified complication of cardiac and vascular prosthetic device, implant and graft, initial encounter: Secondary | ICD-10-CM

## 2016-07-16 DIAGNOSIS — N2889 Other specified disorders of kidney and ureter: Secondary | ICD-10-CM | POA: Diagnosis present

## 2016-07-16 DIAGNOSIS — D631 Anemia in chronic kidney disease: Secondary | ICD-10-CM

## 2016-07-16 DIAGNOSIS — R0902 Hypoxemia: Secondary | ICD-10-CM | POA: Diagnosis present

## 2016-07-16 DIAGNOSIS — Z880 Allergy status to penicillin: Secondary | ICD-10-CM

## 2016-07-16 DIAGNOSIS — Z6827 Body mass index (BMI) 27.0-27.9, adult: Secondary | ICD-10-CM

## 2016-07-16 DIAGNOSIS — T82838A Hemorrhage of vascular prosthetic devices, implants and grafts, initial encounter: Secondary | ICD-10-CM | POA: Diagnosis not present

## 2016-07-16 DIAGNOSIS — H9190 Unspecified hearing loss, unspecified ear: Secondary | ICD-10-CM | POA: Diagnosis present

## 2016-07-16 DIAGNOSIS — M898X9 Other specified disorders of bone, unspecified site: Secondary | ICD-10-CM | POA: Diagnosis present

## 2016-07-16 DIAGNOSIS — J811 Chronic pulmonary edema: Secondary | ICD-10-CM | POA: Diagnosis present

## 2016-07-16 DIAGNOSIS — T462X5A Adverse effect of other antidysrhythmic drugs, initial encounter: Secondary | ICD-10-CM | POA: Diagnosis present

## 2016-07-16 HISTORY — PX: IR GENERIC HISTORICAL: IMG1180011

## 2016-07-16 LAB — CBC WITH DIFFERENTIAL/PLATELET
BASOS ABS: 0 10*3/uL (ref 0.0–0.1)
BASOS PCT: 0 %
EOS PCT: 2 %
Eosinophils Absolute: 0.1 10*3/uL (ref 0.0–0.7)
HCT: 37.3 % — ABNORMAL LOW (ref 39.0–52.0)
Hemoglobin: 11.9 g/dL — ABNORMAL LOW (ref 13.0–17.0)
LYMPHS PCT: 12 %
Lymphs Abs: 1 10*3/uL (ref 0.7–4.0)
MCH: 28 pg (ref 26.0–34.0)
MCHC: 31.9 g/dL (ref 30.0–36.0)
MCV: 87.8 fL (ref 78.0–100.0)
MONO ABS: 0.5 10*3/uL (ref 0.1–1.0)
Monocytes Relative: 6 %
Neutro Abs: 6.5 10*3/uL (ref 1.7–7.7)
Neutrophils Relative %: 80 %
PLATELETS: 205 10*3/uL (ref 150–400)
RBC: 4.25 MIL/uL (ref 4.22–5.81)
RDW: 17.5 % — AB (ref 11.5–15.5)
WBC: 8.2 10*3/uL (ref 4.0–10.5)

## 2016-07-16 LAB — COMPREHENSIVE METABOLIC PANEL
ALK PHOS: 109 U/L (ref 38–126)
ALT: 14 U/L — AB (ref 17–63)
AST: 18 U/L (ref 15–41)
Albumin: 3.9 g/dL (ref 3.5–5.0)
Anion gap: 20 — ABNORMAL HIGH (ref 5–15)
BILIRUBIN TOTAL: 0.8 mg/dL (ref 0.3–1.2)
BUN: 82 mg/dL — ABNORMAL HIGH (ref 6–20)
CALCIUM: 8.3 mg/dL — AB (ref 8.9–10.3)
CO2: 22 mmol/L (ref 22–32)
CREATININE: 17.91 mg/dL — AB (ref 0.61–1.24)
Chloride: 96 mmol/L — ABNORMAL LOW (ref 101–111)
GFR, EST AFRICAN AMERICAN: 3 mL/min — AB (ref >60)
GFR, EST NON AFRICAN AMERICAN: 2 mL/min — AB (ref >60)
Glucose, Bld: 110 mg/dL — ABNORMAL HIGH (ref 65–99)
Potassium: 5.7 mmol/L — ABNORMAL HIGH (ref 3.5–5.1)
Sodium: 138 mmol/L (ref 135–145)
TOTAL PROTEIN: 8.1 g/dL (ref 6.5–8.1)

## 2016-07-16 LAB — I-STAT CG4 LACTIC ACID, ED: LACTIC ACID, VENOUS: 2.23 mmol/L — AB (ref 0.5–1.9)

## 2016-07-16 MED ORDER — METOPROLOL TARTRATE 25 MG PO TABS: 25.0000 mg | ORAL_TABLET | Freq: Two times a day (BID) | ORAL | Status: DC

## 2016-07-16 MED ORDER — FENTANYL CITRATE (PF) 100 MCG/2ML IJ SOLN
50.0000 ug | Freq: Once | INTRAMUSCULAR | Status: AC
  Administered 2016-07-16: 50 ug via INTRAVENOUS
  Filled 2016-07-16: qty 2

## 2016-07-16 MED ORDER — HYDROCODONE-ACETAMINOPHEN 5-325 MG PO TABS
1.0000 | ORAL_TABLET | ORAL | Status: DC | PRN
  Administered 2016-07-16 – 2016-07-22 (×4): 2 via ORAL
  Filled 2016-07-16: qty 2

## 2016-07-16 MED ORDER — LORATADINE 10 MG PO TABS
10.0000 mg | ORAL_TABLET | Freq: Every day | ORAL | Status: DC
Start: 2016-07-17 — End: 2016-07-23
  Administered 2016-07-17 – 2016-07-23 (×6): 10 mg via ORAL
  Filled 2016-07-16 (×6): qty 1

## 2016-07-16 MED ORDER — LEVOTHYROXINE SODIUM 50 MCG PO TABS
50.0000 ug | ORAL_TABLET | Freq: Every day | ORAL | Status: DC
Start: 2016-07-17 — End: 2016-07-16

## 2016-07-16 MED ORDER — ONDANSETRON HCL 4 MG PO TABS
4.0000 mg | ORAL_TABLET | Freq: Four times a day (QID) | ORAL | Status: DC | PRN
Start: 2016-07-16 — End: 2016-07-23

## 2016-07-16 MED ORDER — LABETALOL HCL 200 MG PO TABS
200.0000 mg | ORAL_TABLET | Freq: Two times a day (BID) | ORAL | Status: DC
  Administered 2016-07-17 (×2): 200 mg via ORAL
  Filled 2016-07-16 (×2): qty 1

## 2016-07-16 MED ORDER — ACETAMINOPHEN 325 MG PO TABS
650.0000 mg | ORAL_TABLET | Freq: Four times a day (QID) | ORAL | Status: DC | PRN
  Administered 2016-07-16: 650 mg via ORAL

## 2016-07-16 MED ORDER — HEPARIN SODIUM (PORCINE) 1000 UNIT/ML IJ SOLN
INTRAMUSCULAR | Status: AC
  Filled 2016-07-16: qty 1

## 2016-07-16 MED ORDER — IPRATROPIUM-ALBUTEROL 0.5-2.5 (3) MG/3ML IN SOLN: 3.0000 mL | Freq: Four times a day (QID) | RESPIRATORY_TRACT | Status: DC | PRN

## 2016-07-16 MED ORDER — CALCITRIOL 0.5 MCG PO CAPS
0.5000 ug | ORAL_CAPSULE | ORAL | Status: DC
  Administered 2016-07-18: 0.5 ug via ORAL
  Filled 2016-07-16 (×3): qty 1

## 2016-07-16 MED ORDER — HEPARIN SODIUM (PORCINE) 1000 UNIT/ML DIALYSIS: 1000.0000 [IU] | INTRAMUSCULAR | Status: DC | PRN

## 2016-07-16 MED ORDER — ONDANSETRON HCL 4 MG/2ML IJ SOLN: 4.0000 mg | Freq: Four times a day (QID) | INTRAMUSCULAR | Status: DC | PRN

## 2016-07-16 MED ORDER — HYDRALAZINE HCL 20 MG/ML IJ SOLN
2.0000 mg | Freq: Three times a day (TID) | INTRAMUSCULAR | Status: DC | PRN
  Filled 2016-07-16 (×2): qty 1

## 2016-07-16 MED ORDER — DARBEPOETIN ALFA 40 MCG/0.4ML IJ SOSY
PREFILLED_SYRINGE | INTRAMUSCULAR | Status: AC
  Administered 2016-07-16: 40 ug via INTRAVENOUS
  Filled 2016-07-16: qty 0.4

## 2016-07-16 MED ORDER — CAPSAICIN 0.025 % EX CREA: 1.0000 "application " | TOPICAL_CREAM | Freq: Two times a day (BID) | CUTANEOUS | Status: DC | PRN

## 2016-07-16 MED ORDER — SEVELAMER CARBONATE 2.4 G PO PACK
4.8000 g | PACK | Freq: Three times a day (TID) | ORAL | Status: DC
  Administered 2016-07-18 – 2016-07-20 (×4): 4.8 g via ORAL
  Filled 2016-07-16 (×15): qty 2

## 2016-07-16 MED ORDER — SODIUM CHLORIDE 0.9 % IV SOLN: 100.0000 mL | INTRAVENOUS | Status: DC | PRN

## 2016-07-16 MED ORDER — AMIODARONE HCL 200 MG PO TABS
400.0000 mg | ORAL_TABLET | Freq: Every day | ORAL | Status: DC
  Administered 2016-07-17 – 2016-07-23 (×6): 400 mg via ORAL
  Filled 2016-07-16 (×6): qty 2

## 2016-07-16 MED ORDER — FENTANYL CITRATE (PF) 100 MCG/2ML IJ SOLN
INTRAMUSCULAR | Status: AC
  Filled 2016-07-16: qty 4

## 2016-07-16 MED ORDER — LIDOCAINE-EPINEPHRINE 1 %-1:100000 IJ SOLN
INTRAMUSCULAR | Status: AC | PRN
  Administered 2016-07-16: 10 mL

## 2016-07-16 MED ORDER — SACCHAROMYCES BOULARDII 250 MG PO CAPS
250.0000 mg | ORAL_CAPSULE | Freq: Two times a day (BID) | ORAL | Status: DC
  Administered 2016-07-17 – 2016-07-23 (×8): 250 mg via ORAL
  Filled 2016-07-16 (×11): qty 1

## 2016-07-16 MED ORDER — IOPAMIDOL (ISOVUE-300) INJECTION 61%
INTRAVENOUS | Status: AC
  Administered 2016-07-16: 20 mL
  Filled 2016-07-16: qty 50

## 2016-07-16 MED ORDER — LIDOCAINE-PRILOCAINE 2.5-2.5 % EX CREA: 1.0000 "application " | TOPICAL_CREAM | CUTANEOUS | Status: DC | PRN

## 2016-07-16 MED ORDER — HYDROCODONE-ACETAMINOPHEN 5-325 MG PO TABS
ORAL_TABLET | ORAL | Status: AC
  Administered 2016-07-16: 2 via ORAL
  Filled 2016-07-16: qty 2

## 2016-07-16 MED ORDER — HEPARIN SODIUM (PORCINE) 1000 UNIT/ML DIALYSIS: 40.0000 [IU]/kg | Freq: Once | INTRAMUSCULAR | Status: DC

## 2016-07-16 MED ORDER — MIRTAZAPINE 15 MG PO TABS: 15.0000 mg | ORAL_TABLET | Freq: Every day | ORAL | Status: DC

## 2016-07-16 MED ORDER — LIDOCAINE-EPINEPHRINE (PF) 1 %-1:200000 IJ SOLN
INTRAMUSCULAR | Status: AC
  Filled 2016-07-16: qty 30

## 2016-07-16 MED ORDER — FENTANYL CITRATE (PF) 100 MCG/2ML IJ SOLN
INTRAMUSCULAR | Status: AC | PRN
  Administered 2016-07-16 (×2): 50 ug via INTRAVENOUS

## 2016-07-16 MED ORDER — SEVELAMER CARBONATE 800 MG PO TABS: 800.0000 mg | ORAL_TABLET | Freq: Three times a day (TID) | ORAL | Status: DC

## 2016-07-16 MED ORDER — LAMOTRIGINE 25 MG PO TABS
50.0000 mg | ORAL_TABLET | Freq: Every day | ORAL | Status: DC
  Administered 2016-07-17 – 2016-07-21 (×5): 50 mg via ORAL
  Filled 2016-07-16 (×8): qty 2

## 2016-07-16 MED ORDER — ASPIRIN 81 MG PO CHEW: 81.0000 mg | CHEWABLE_TABLET | Freq: Every day | ORAL | Status: DC

## 2016-07-16 MED ORDER — HYDRALAZINE HCL 20 MG/ML IJ SOLN
INTRAMUSCULAR | Status: AC | PRN
  Administered 2016-07-16 (×2): 10 mg via INTRAVENOUS

## 2016-07-16 MED ORDER — ACETAMINOPHEN 325 MG PO TABS
ORAL_TABLET | ORAL | Status: AC
  Administered 2016-07-16: 650 mg via ORAL
  Filled 2016-07-16: qty 2

## 2016-07-16 MED ORDER — LIDOCAINE HCL (PF) 1 % IJ SOLN: 5.0000 mL | INTRAMUSCULAR | Status: DC | PRN

## 2016-07-16 MED ORDER — ASPIRIN 81 MG PO CHEW
81.0000 mg | CHEWABLE_TABLET | Freq: Every day | ORAL | Status: DC
  Administered 2016-07-17 – 2016-07-23 (×6): 81 mg via ORAL
  Filled 2016-07-16 (×6): qty 1

## 2016-07-16 MED ORDER — HEPARIN (PORCINE) IN NACL 100-0.45 UNIT/ML-% IJ SOLN
1200.0000 [IU]/h | INTRAMUSCULAR | Status: DC
  Administered 2016-07-16: 1200 [IU]/h via INTRAVENOUS
  Filled 2016-07-16: qty 250

## 2016-07-16 MED ORDER — RENA-VITE PO TABS: 1.0000 | ORAL_TABLET | Freq: Every day | ORAL | Status: DC

## 2016-07-16 MED ORDER — PENTAFLUOROPROP-TETRAFLUOROETH EX AERO: 1.0000 "application " | INHALATION_SPRAY | CUTANEOUS | Status: DC | PRN

## 2016-07-16 MED ORDER — LIDOCAINE HCL 1 % IJ SOLN
INTRAMUSCULAR | Status: AC | PRN
  Administered 2016-07-16: 0.1 mL
  Administered 2016-07-16: 20 mL

## 2016-07-16 MED ORDER — VANCOMYCIN HCL IN DEXTROSE 1-5 GM/200ML-% IV SOLN
INTRAVENOUS | Status: AC
  Administered 2016-07-16: 1 g
  Filled 2016-07-16: qty 200

## 2016-07-16 MED ORDER — LEVOTHYROXINE SODIUM 75 MCG PO TABS
75.0000 ug | ORAL_TABLET | Freq: Every day | ORAL | Status: DC
  Administered 2016-07-17 – 2016-07-23 (×6): 75 ug via ORAL
  Filled 2016-07-16 (×7): qty 1

## 2016-07-16 MED ORDER — DARBEPOETIN ALFA 40 MCG/0.4ML IJ SOSY
40.0000 ug | PREFILLED_SYRINGE | INTRAMUSCULAR | Status: DC
  Administered 2016-07-16: 40 ug via INTRAVENOUS
  Filled 2016-07-16: qty 0.4

## 2016-07-16 MED ORDER — HYDRALAZINE HCL 20 MG/ML IJ SOLN
INTRAMUSCULAR | Status: AC
  Filled 2016-07-16: qty 1

## 2016-07-16 MED ORDER — HEPARIN SODIUM (PORCINE) 1000 UNIT/ML IJ SOLN
INTRAMUSCULAR | Status: DC | PRN
  Administered 2016-07-16: 5200 [IU] via INTRAVENOUS

## 2016-07-16 MED ORDER — POLYETHYLENE GLYCOL 3350 17 G PO PACK: 17.0000 g | PACK | Freq: Every day | ORAL | Status: DC | PRN

## 2016-07-16 MED ORDER — ALTEPLASE 2 MG IJ SOLR: 2.0000 mg | Freq: Once | INTRAMUSCULAR | Status: DC | PRN

## 2016-07-16 MED ORDER — AMIODARONE HCL 200 MG PO TABS: 200.0000 mg | ORAL_TABLET | Freq: Every day | ORAL | Status: DC

## 2016-07-16 MED ORDER — RENA-VITE PO TABS
1.0000 | ORAL_TABLET | Freq: Every day | ORAL | Status: DC
  Administered 2016-07-17 – 2016-07-20 (×3): 1 via ORAL
  Filled 2016-07-16 (×5): qty 1

## 2016-07-16 MED ORDER — LAMOTRIGINE 5 MG PO CHEW: 25.0000 mg | CHEWABLE_TABLET | Freq: Every day | ORAL | Status: DC

## 2016-07-16 MED ORDER — CALCIUM ACETATE (PHOS BINDER) 667 MG PO CAPS
2001.0000 mg | ORAL_CAPSULE | Freq: Three times a day (TID) | ORAL | Status: DC
  Administered 2016-07-18 – 2016-07-23 (×8): 2001 mg via ORAL
  Filled 2016-07-16 (×12): qty 3

## 2016-07-16 MED ORDER — MIRTAZAPINE 15 MG PO TBDP
15.0000 mg | ORAL_TABLET | Freq: Every day | ORAL | Status: DC
  Administered 2016-07-17 – 2016-07-20 (×3): 15 mg via ORAL
  Filled 2016-07-16 (×8): qty 1

## 2016-07-16 NOTE — Sedation Documentation (Signed)
Patient denies pain and is resting comfortably.  

## 2016-07-16 NOTE — Progress Notes (Signed)
Patient arrived to unit by ED stretcher.  Reviewed treatment plan and this RN agrees with plan.  Report received from bedside RN, Janett Billow.  Consent obtained.  Patient A & O X 2.   Lung sounds diminished, coarse to ausculation in all fields. Generalized edema. Cardiac:  NSR to afib.  Removed caps and cleansed R groin catheter with chlorhedxidine.  Aspirated ports of heparin and flushed them with saline per protocol.  Connected and secured lines, initiated treatment at [place].  UF Goal of 5558mL and net fluid removal 5L.  Will continue to monitor.

## 2016-07-16 NOTE — Progress Notes (Addendum)
I spoke to Seth Bake, Mr Been's nurse at [facility] and [place].  Seth Bake reports that patient's Left arm has become swollen and is tingling, a mobile ultrasound was preformed 07/15/16 but did not rule in or out bleeding.  Seth Bake is going to notify [facility] today. Seth Bake reports that Mr Odoms is short of breath frequently- "he has skipped dialysis in the past and does not adhere to fluid restriction. "  "We hear some wheezing, and give him breathing treatments at times."

## 2016-07-16 NOTE — ED Notes (Signed)
Pt transported to IR to have dialysis cathter placed.

## 2016-07-16 NOTE — ED Notes (Signed)
Report given to [facility] in Dialysis. Pt to come back to ed from iR and then to dialysis

## 2016-07-16 NOTE — Progress Notes (Signed)
Dialysis treatment completed.  4200 mL ultrafiltrated.  3500 mL net fluid removal.  Patient status unchanged. Lung sounds diminished to ausculation in all fields. Generalized edema. Cardiac: Afib.  Cleansed Right thigh catheter with chlorhexidine.  Disconnected lines and flushed ports with saline per protocol.  Ports locked with heparin and capped per protocol.    Report given to bedside, RN Tish.

## 2016-07-16 NOTE — Pre-Procedure Instructions (Addendum)
    Noah Lewis  07/16/2016    Your procedure is scheduled on Wednesday, October 25.  Report to [facility] Admitting at 10:30 AM .  Call this number if you have problems the morning of [phone]   Remember:  Do not eat food or drink liquids after midnight.  Take these medicines the morning of surgery with A SIP OF WATER: amiodarone (PACERONE), aspirin 81, lamoTRIgine (LAMICTAL), levothyroxine (SYNTHROID, LEVOTHROID, loratadine (CLARITIN), metoprolol tartrate (LOPRESSOR). May take Tylenol if needed.              May have Breathing treatment if needed.                 Eliquis- hold or continue as instructed by Dr Trula Slade.  Special instructions:  Please send current Medication Record with last dose of medications signed.    Do not wear jewelry, make-up or nail polish.  Do not wear lotions, powders, or perfumes, or deoderant.  Do not shave 48 hours prior to surgery.  Men may shave face and neck.  Do not bring valuables to the hospital.  [facility] is not responsible for any belongings or valuables.  Contacts, dentures or bridgework may not be worn into surgery.  Leave your suitcase in the car.  After surgery it may be brought to your room.

## 2016-07-16 NOTE — Sedation Documentation (Signed)
Patient is resting comfortably. 

## 2016-07-16 NOTE — H&P (Signed)
Chief Complaint: possible infected AVF  Referring Physician:Dr. Jeneen Rinks Deterding  Supervising Physician: Arne Cleveland  Patient Status: [facility] - In-pt  HPI: Noah Lewis is an 65 y.o. male who is ESRD and has been sent to the ED secondary to LUE pain.  He saw Dr. Bridgett Larsson a couple of days ago who felt he would need ligation of this AVF as it was not salvageable.  He was brought in today from his facility secondary to pain and a concern for possible infection.  We have been asked to see him for placement of a perm cath.  Past Medical History:  Past Medical History:  Diagnosis Date  . Arthritis   . Asthma   . Cancer ([place])   . COPD (chronic obstructive pulmonary disease) ([place])   . Coronary artery disease   . Diabetes mellitus without complication ([place])   . Hypertension   . Renal disorder     Past Surgical History:  Past Surgical History:  Procedure Laterality Date  . INSERTION OF DIALYSIS CATHETER    . INSERTION OF DIALYSIS CATHETER N/A 05/29/2015   Procedure: INSERTION OF DIALYSIS CATHETER;  Surgeon: Angelia Mould, MD;  Location: [place];  Service: Vascular;  Laterality: N/A;  . NO PAST SURGERIES    . PERIPHERAL VASCULAR CATHETERIZATION N/A 10/25/2015   Procedure: Dialysis/Perma Catheter Insertion;  Surgeon: Katha Cabal, MD;  Location: [place] [facility];  Service: Cardiovascular;  Laterality: N/A;  . PERIPHERAL VASCULAR CATHETERIZATION Left 02/22/2016   Procedure: A/V Shuntogram/Fistulagram;  Surgeon: Algernon Huxley, MD;  Location: [place] [facility];  Service: Cardiovascular;  Laterality: Left;  . PERIPHERAL VASCULAR CATHETERIZATION Right 03/19/2016   Procedure: Dialysis/Perma Catheter Insertion;  Surgeon: Katha Cabal, MD;  Location: [place] [facility];  Service: Cardiovascular;  Laterality: Right;  . PERIPHERAL VASCULAR CATHETERIZATION N/A 04/22/2016   Procedure: Dialysis/Perma Catheter Removal;  Surgeon: Algernon Huxley, MD;  Location: [place] [facility];   Service: Cardiovascular;  Laterality: N/A;    Family History:  Family History  Problem Relation Age of Onset  . Hypertension Other   . Diabetes Other     Social History:  reports that he has never smoked. He has never used smokeless tobacco. He reports that he uses drugs, including Marijuana. He reports that he does not drink alcohol.  Allergies:  Allergies  Allergen Reactions  . Penicillins Other (See Comments)    Reaction:  Unknown     Medications: Medications reviewed in Epic  Please HPI for pertinent positives, otherwise complete 10 system ROS negative.  Mallampati Score: MD Evaluation Airway: WNL Heart: WNL Abdomen: WNL Chest/ Lungs: WNL ASA  Classification: 3 Mallampati/Airway Score: Four  Physical Exam: BP (!) 169/109   Pulse 85   Temp 98 F (36.7 C) (Oral)   Resp 17   Ht _0  (1.702 m)   Wt 200 lb (90.7 kg)   SpO2 95%   BMI 31.32 kg/m  Body mass index is 31.32 kg/m. General: chronically ill-appearing black male who is laying in bed in NAD HEENT: head is normocephalic, atraumatic. PERRL.  Ears and nose without any masses or lesions.  Mouth is pink and moist Heart: regular, rate, and rhythm.  Normal s1,s2. No obvious murmurs, gallops, or rubs noted.  Palpable radial and pedal pulses bilaterally Lungs: CTAB, no wheezes, rhonchi, or rales noted.  Respiratory effort nonlabored Psych: Alert and oriented at least to place.  He refused to answer my other questions.   Labs: Results  for orders placed or performed during the hospital encounter of 07/16/16 (from the past 48 hour(s))  Comprehensive metabolic panel     Status: Abnormal   Collection Time: 07/16/16  2:17 PM  Result Value Ref Range   Sodium 138 135 - 145 mmol/L   Potassium 5.7 (H) 3.5 - 5.1 mmol/L   Chloride 96 (L) 101 - 111 mmol/L   CO2 22 22 - 32 mmol/L   Glucose, Bld 110 (H) 65 - 99 mg/dL   BUN 82 (H) 6 - 20 mg/dL   Creatinine, Ser 17.91 (H) 0.61 - 1.24 mg/dL   Calcium 8.3 (L) 8.9 - 10.3  mg/dL   Total Protein 8.1 6.5 - 8.1 g/dL   Albumin 3.9 3.5 - 5.0 g/dL   AST 18 15 - 41 U/L   ALT 14 (L) 17 - 63 U/L   Alkaline Phosphatase 109 38 - 126 U/L   Total Bilirubin 0.8 0.3 - 1.2 mg/dL   GFR calc non Af Amer 2 (L) >60 mL/min   GFR calc Af Amer 3 (L) >60 mL/min    Comment: (NOTE) The eGFR has been calculated using the CKD EPI equation. This calculation has not been validated in all clinical situations. eGFR's persistently <60 mL/min signify possible Chronic Kidney Disease.    Anion gap 20 (H) 5 - 15  CBC with Differential     Status: Abnormal   Collection Time: 07/16/16  2:17 PM  Result Value Ref Range   WBC 8.2 4.0 - 10.5 K/uL   RBC 4.25 4.22 - 5.81 MIL/uL   Hemoglobin 11.9 (L) 13.0 - 17.0 g/dL   HCT 37.3 (L) 39.0 - 52.0 %   MCV 87.8 78.0 - 100.0 fL   MCH 28.0 26.0 - 34.0 pg   MCHC 31.9 30.0 - 36.0 g/dL   RDW 17.5 (H) 11.5 - 15.5 %   Platelets 205 150 - 400 K/uL   Neutrophils Relative % 80 %   Neutro Abs 6.5 1.7 - 7.7 K/uL   Lymphocytes Relative 12 %   Lymphs Abs 1.0 0.7 - 4.0 K/uL   Monocytes Relative 6 %   Monocytes Absolute 0.5 0.1 - 1.0 K/uL   Eosinophils Relative 2 %   Eosinophils Absolute 0.1 0.0 - 0.7 K/uL   Basophils Relative 0 %   Basophils Absolute 0.0 0.0 - 0.1 K/uL  I-Stat CG4 Lactic Acid, ED     Status: Abnormal   Collection Time: 07/16/16  2:26 PM  Result Value Ref Range   Lactic Acid, Venous 2.23 (HH) 0.5 - 1.9 mmol/L   Comment NOTIFIED PHYSICIAN     Imaging: Dg Chest Portable 1 View  Result Date: 07/16/2016 CLINICAL DATA:  Hypoxia EXAM: PORTABLE CHEST 1 VIEW COMPARISON:  06/13/2016 FINDINGS: Diffuse interstitial opacity with occasional Kerley lines. No pleural effusion. Chronic cardiopericardial enlargement. Left brachiocephalic stent again noted. IMPRESSION: Mild pulmonary edema. Electronically Signed   By: Monte Fantasia M.D.   On: 07/16/2016 15:48    Assessment/Plan 1. ESRD -we will proceed with placement of a perm HD cath -labs and  vitals reviewed -Risks and Benefits discussed with the patient including, but not limited to bleeding, infection, vascular injury, pneumothorax which may require chest tube placement, air embolism or even death All of the patient's questions were answered, patient is agreeable to proceed. Consent signed and in chart.   Thank you for this interesting consult.  I greatly enjoyed meeting Wallace Gappa and look forward to participating in their care.  A copy of  this report was sent to the requesting provider on this date.  Electronically Signed: Henreitta Cea 07/16/2016, 4:31 PM   I spent a total of 20 Minutes    in face to face in clinical consultation, greater than 50% of which was counseling/coordinating care for ESRD

## 2016-07-16 NOTE — ED Provider Notes (Signed)
[place] DEPT Provider Note   CSN: [id number] Arrival date & time: 07/16/16  1405     History   Chief Complaint Chief Complaint  Patient presents with  . Vascular Access Problem    HPI Noah Lewis is a 65 y.o. male.  HPI  level V caveat patient poor historian, dyspneic. History is obtained from old records and Galen Daft, , nurse at [facility] skilled nursing facility via telephone and from patient. Patient has been progressively more dyspneic over the past 2 days. He last had hemodialysis 3 days ago. He also complains of pain at his left upper arm at site of dialysis fistula for the past few days. Patient was to have diagnostic study of dialysis fistula scheduled for tomorrow. He presents today due to increased dyspnea. No fever. No chest pain. No other associated symptoms  Past Medical History:  Diagnosis Date  . Arthritis   . Asthma   . Cancer ([place])   . COPD (chronic obstructive pulmonary disease) ([place])   . Coronary artery disease   . Diabetes mellitus without complication ([place])   . Hypertension   . Renal disorder     Patient Active Problem List   Diagnosis Date Noted  . ESRD (end stage renal disease) ([place]) 06/13/2016  . 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 Pulmonary edema 05/27/2016  . Back pain   . Bacteremia   . Chronic a-fib ([place]) 12/04/2015  . Hypertensive urgency 09/20/2015  . 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 Anemia of chronic kidney failure   . Left arm swelling   . End-stage renal disease needing dialysis ([place]) 05/29/2015  . 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 COPD, moderate ([place]) 05/24/2015  . OSA (obstructive sleep apnea) 05/24/2015  . 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 ESRD on hemodialysis ([place]) 05/13/2015  . Volume overload 04/28/2015  . Bipolar depression ([place]) 04/28/2015  . Benign hypertension  with end-stage renal disease ([place]) 04/28/2015  . Chronic back pain 04/28/2015  . Facial swelling 04/28/2015  . Left upper extremity swelling     Past Surgical History:  Procedure Laterality Date  . INSERTION OF DIALYSIS CATHETER    . INSERTION OF DIALYSIS CATHETER N/A 05/29/2015   Procedure: INSERTION OF DIALYSIS CATHETER;  Surgeon: Angelia Mould, MD;  Location: [place];  Service: Vascular;  Laterality: N/A;  . NO PAST SURGERIES    . PERIPHERAL VASCULAR CATHETERIZATION N/A 10/25/2015   Procedure: Dialysis/Perma Catheter Insertion;  Surgeon: Katha Cabal, MD;  Location: [place] [facility];  Service: Cardiovascular;  Laterality: N/A;  . PERIPHERAL VASCULAR CATHETERIZATION Left 02/22/2016   Procedure: A/V Shuntogram/Fistulagram;  Surgeon: Algernon Huxley, MD;  Location: [place] [facility];  Service: Cardiovascular;  Laterality: Left;  . PERIPHERAL VASCULAR CATHETERIZATION Right 03/19/2016   Procedure: Dialysis/Perma Catheter Insertion;  Surgeon: Katha Cabal, MD;  Location: [place] [facility];  Service: Cardiovascular;  Laterality: Right;  . PERIPHERAL VASCULAR CATHETERIZATION N/A 04/22/2016   Procedure: Dialysis/Perma Catheter Removal;  Surgeon: Algernon Huxley, MD;  Location: [place] [facility];  Service: Cardiovascular;  Laterality: N/A;       Home Medications    Prior to Admission medications   Medication Sig Start Date End Date Taking? Authorizing Provider  acetaminophen (TYLENOL) 325 MG tablet Take 650 mg by mouth every 6 (six) hours as needed for mild pain, fever or headache.   Yes Historical  Provider, MD  amiodarone (PACERONE) 400 MG tablet Take 1 tablet (400 mg total) by mouth daily. 12/14/15  Yes Srikar Sudini, MD  apixaban (ELIQUIS) 5 MG TABS tablet Take 5 mg by mouth 2 (two) times daily.   Yes Historical Provider, MD  aspirin 81 MG chewable tablet Chew 1 tablet (81 mg total) by mouth daily. 06/02/15  Yes Maryellen Pile, MD  b complex-vitamin c-folic acid (NEPHRO-VITE)  0.8 MG TABS tablet Take 1 tablet by mouth 2 (two) times daily.   Yes Historical Provider, MD  Capsaicin (CAPSAGEL) 0.025 % GEL Apply 1 application topically 2 (two) times daily as needed (for lower back pain).   Yes Historical Provider, MD  doxycycline (VIBRAMYCIN) 100 MG capsule Take 100 mg by mouth 2 (two) times daily.   Yes Historical Provider, MD  ipratropium-albuterol (DUONEB) 0.5-2.5 (3) MG/3ML SOLN Take 3 mLs by nebulization every 6 (six) hours as needed (for wheezing/shortness of breath).   Yes Historical Provider, MD  lamoTRIgine (LAMICTAL) 25 MG tablet Take 50 mg by mouth daily.   Yes Historical Provider, MD  levothyroxine (SYNTHROID, LEVOTHROID) 75 MCG tablet Take 75 mcg by mouth daily before breakfast.    Yes Historical Provider, MD  loratadine (CLARITIN) 10 MG tablet Take 1 tablet (10 mg total) by mouth daily. 05/24/15  Yes Gerlene Fee, NP  metoprolol tartrate (LOPRESSOR) 25 MG tablet Take 1 tablet (25 mg total) by mouth 2 (two) times daily. 12/14/15  Yes Srikar Sudini, MD  mirtazapine (REMERON) 15 MG tablet Take 15 mg by mouth at bedtime.   Yes Historical Provider, MD  polyethylene glycol (MIRALAX / GLYCOLAX) packet Take 17 g by mouth daily as needed for mild constipation.    Yes Historical Provider, MD  saccharomyces boulardii (FLORASTOR) 250 MG capsule Take 250 mg by mouth 2 (two) times daily.   Yes Historical Provider, MD  sevelamer (RENAGEL) 800 MG tablet Take 2,400 mg by mouth 3 (three) times daily with meals.   Yes Historical Provider, MD  Travoprost, BAK Free, (TRAVATAN) 0.004 % SOLN ophthalmic solution Place 1 drop into both eyes at bedtime.   Yes Historical Provider, MD    Family History Family History  Problem Relation Age of Onset  . Hypertension Other   . Diabetes Other     Social History Social History  Substance Use Topics  . Smoking status: Never Smoker  . Smokeless tobacco: Never Used  . Alcohol use No     Allergies   Penicillins   Review of  Systems Review of Systems  Unable to perform ROS: Other  Allergic/Immunologic: Positive for immunocompromised state.       Diabetic   Dyspnea, poor historian  Physical Exam Updated Vital Signs BP (!) 169/109   Pulse 85   Temp 98 F (36.7 C) (Oral)   Resp 17   Ht 5\' 7"  (1.702 m)   Wt 200 lb (90.7 kg)   SpO2 95%   BMI 31.32 kg/m   Physical Exam  Constitutional:  Chronically ill-appearing  HENT:  Head: Normocephalic and atraumatic.  Right Ear: External ear normal.  Eyes: Conjunctivae are normal. Pupils are equal, round, and reactive to light.  Neck: Neck supple. No tracheal deviation present. No thyromegaly present.  Cardiovascular: Normal rate.   Irregularly irregular  Pulmonary/Chest: Effort normal. He has rales.  Rales one third of the way up bilaterally  Abdominal: Soft. Bowel sounds are normal. He exhibits no distension. There is no tenderness.  Obese  Musculoskeletal: Normal range of motion.  He exhibits no edema or tenderness.  Left upper extremity with dialysis fistula with scabbed lesion which is diffusely tender. There is a good thrill.  Neurological: Coordination normal.  Slightly sleepy easily arousable to verbal stimulus  Skin: Skin is warm and dry. No rash noted.  No decubitus ulcer or skin lesion  Psychiatric: He has a normal mood and affect.  Nursing note and vitals reviewed.    ED Treatments / Results  Labs (all labs ordered are listed, but only abnormal results are displayed) Labs Reviewed  COMPREHENSIVE METABOLIC PANEL - Abnormal; Notable for the following:       Result Value   Potassium 5.7 (*)    Chloride 96 (*)    Glucose, Bld 110 (*)    BUN 82 (*)    Creatinine, Ser 17.91 (*)    Calcium 8.3 (*)    ALT 14 (*)    GFR calc non Af Amer 2 (*)    GFR calc Af Amer 3 (*)    Anion gap 20 (*)    All other components within normal limits  CBC WITH DIFFERENTIAL/PLATELET - Abnormal; Notable for the following:    Hemoglobin 11.9 (*)    HCT 37.3  (*)    RDW 17.5 (*)    All other components within normal limits  I-STAT CG4 LACTIC ACID, ED - Abnormal; Notable for the following:    Lactic Acid, Venous 2.23 (*)    All other components within normal limits    EKG  EKG Interpretation None       Radiology Dg Chest Portable 1 View  Result Date: 07/16/2016 CLINICAL DATA:  Hypoxia EXAM: PORTABLE CHEST 1 VIEW COMPARISON:  06/13/2016 FINDINGS: Diffuse interstitial opacity with occasional Kerley lines. No pleural effusion. Chronic cardiopericardial enlargement. Left brachiocephalic stent again noted. IMPRESSION: Mild pulmonary edema. Electronically Signed   By: Monte Fantasia M.D.   On: 07/16/2016 15:48    Procedures Procedures (including critical care time)  Medications Ordered in ED Medications  heparin 1000 UNIT/ML injection (not administered)  lidocaine-EPINEPHrine (XYLOCAINE-EPINEPHrine) 1 %-1:200000 (PF) injection (not administered)  fentaNYL (SUBLIMAZE) injection 50 mcg (50 mcg Intravenous Given 07/16/16 1530)     Initial Impression / Assessment and Plan / ED Course  I have reviewed the triage vital signs and the nursing notes.  Pertinent labs & imaging results that were available during my care of the patient were reviewed by me and considered in my medical decision making (see chart for details).  Clinical Course   Chest x-ray viewed by me Results for orders placed or performed during the hospital encounter of 07/16/16  Comprehensive metabolic panel  Result Value Ref Range   Sodium 138 135 - 145 mmol/L   Potassium 5.7 (H) 3.5 - 5.1 mmol/L   Chloride 96 (L) 101 - 111 mmol/L   CO2 22 22 - 32 mmol/L   Glucose, Bld 110 (H) 65 - 99 mg/dL   BUN 82 (H) 6 - 20 mg/dL   Creatinine, Ser 17.91 (H) 0.61 - 1.24 mg/dL   Calcium 8.3 (L) 8.9 - 10.3 mg/dL   Total Protein 8.1 6.5 - 8.1 g/dL   Albumin 3.9 3.5 - 5.0 g/dL   AST 18 15 - 41 U/L   ALT 14 (L) 17 - 63 U/L   Alkaline Phosphatase 109 38 - 126 U/L   Total Bilirubin  0.8 0.3 - 1.2 mg/dL   GFR calc non Af Amer 2 (L) >60 mL/min   GFR calc Af Amer 3 (L) >  60 mL/min   Anion gap 20 (H) 5 - 15  CBC with Differential  Result Value Ref Range   WBC 8.2 4.0 - 10.5 K/uL   RBC 4.25 4.22 - 5.81 MIL/uL   Hemoglobin 11.9 (L) 13.0 - 17.0 g/dL   HCT 37.3 (L) 39.0 - 52.0 %   MCV 87.8 78.0 - 100.0 fL   MCH 28.0 26.0 - 34.0 pg   MCHC 31.9 30.0 - 36.0 g/dL   RDW 17.5 (H) 11.5 - 15.5 %   Platelets 205 150 - 400 K/uL   Neutrophils Relative % 80 %   Neutro Abs 6.5 1.7 - 7.7 K/uL   Lymphocytes Relative 12 %   Lymphs Abs 1.0 0.7 - 4.0 K/uL   Monocytes Relative 6 %   Monocytes Absolute 0.5 0.1 - 1.0 K/uL   Eosinophils Relative 2 %   Eosinophils Absolute 0.1 0.0 - 0.7 K/uL   Basophils Relative 0 %   Basophils Absolute 0.0 0.0 - 0.1 K/uL  I-Stat CG4 Lactic Acid, ED  Result Value Ref Range   Lactic Acid, Venous 2.23 (HH) 0.5 - 1.9 mmol/L   Comment NOTIFIED PHYSICIAN    Dg Chest Portable 1 View  Result Date: 07/16/2016 CLINICAL DATA:  Hypoxia EXAM: PORTABLE CHEST 1 VIEW COMPARISON:  06/13/2016 FINDINGS: Diffuse interstitial opacity with occasional Kerley lines. No pleural effusion. Chronic cardiopericardial enlargement. Left brachiocephalic stent again noted. IMPRESSION: Mild pulmonary edema. Electronically Signed   By: Monte Fantasia M.D.   On: 07/16/2016 15:48   Dr. Trula Slade from vascular surgery consulted. Saw patient in the ED. Requests consult to interventional radiology for additional vascular access for hemodialysis. I consulted Dr.Hassell from interventional radiology who will insert dialysis catheter. I also consulted Dr. Jimmy Footman from nephrology service who will arrange for urgent dialysis immediately following hemodialysis catheter insertion. He requests consultation with hospitalist for admission. I consulted Dr. Quincy Simmonds who will see patient in the emergency department Final Clinical Impressions(s) / ED Diagnoses  Diagnoses #1 pulmonary edema #2 hyperkalemia #3  dialysis fistula malfunction CRITICAL CARE Performed by: Orlie Dakin Total critical care time: 35 minutes Critical care time was exclusive of separately billable procedures and treating other patients. Critical care was necessary to treat or prevent imminent or life-threatening deterioration. Critical care was time spent personally by me on the following activities: development of treatment plan with patient and/or surrogate as well as nursing, discussions with consultants, evaluation of patient's response to treatment, examination of patient, obtaining history from patient or surrogate, ordering and performing treatments and interventions, ordering and review of laboratory studies, ordering and review of radiographic studies, pulse oximetry and re-evaluation of patient's condition. Final diagnoses:  None    New Prescriptions New Prescriptions   No medications on file     Orlie Dakin, MD 07/16/16 1629

## 2016-07-16 NOTE — Progress Notes (Signed)
Heparin drip ordered on patient.  This RN unable to start heparin drip due to contract status in procedural unit.  Bedside RN phoned to initiate drip, however after consulting with house supervisor, she stated she is unable to initiate drip.  This RN phoned house super visor and explained situation.  House supervisor made decision for HD RN to call report to bedside RN, and bedside RN to initiate heparin drip while patient is on HD unit.  Will continue to monitor.

## 2016-07-16 NOTE — Consult Note (Addendum)
Consult Note  Patient name: Noah Lewis MRN: [id number] DOB: [birth year] Sex: male  Consulting Physician:  ER  Reason for Consult:  Chief Complaint  Patient presents with  . Vascular Access Problem    HISTORY OF PRESENT ILLNESS: 65 yo male seen in the office recently by Dr Bridgett Larsson who is on the OR schedule tomorrow for ligation of his left BCF.  He came to the ED today secondary to progressive dyspnea.  Last HD was 3 days ago.  He is c/o pain at his fistula site.  He does not have a catheter.  He is frequently non-compliant with HD.  Past Medical History:  Diagnosis Date  . Arthritis   . Asthma   . Cancer ([place])   . COPD (chronic obstructive pulmonary disease) ([place])   . Coronary artery disease   . Diabetes mellitus without complication ([place])   . Hypertension   . Renal disorder     Past Surgical History:  Procedure Laterality Date  . INSERTION OF DIALYSIS CATHETER    . INSERTION OF DIALYSIS CATHETER N/A 05/29/2015   Procedure: INSERTION OF DIALYSIS CATHETER;  Surgeon: Angelia Mould, MD;  Location: [facility];  Service: Vascular;  Laterality: N/A;  . NO PAST SURGERIES    . PERIPHERAL VASCULAR CATHETERIZATION N/A 10/25/2015   Procedure: Dialysis/Perma Catheter Insertion;  Surgeon: Katha Cabal, MD;  Location: [facility];  Service: Cardiovascular;  Laterality: N/A;  . PERIPHERAL VASCULAR CATHETERIZATION Left 02/22/2016   Procedure: A/V Shuntogram/Fistulagram;  Surgeon: Algernon Huxley, MD;  Location: [facility];  Service: Cardiovascular;  Laterality: Left;  . PERIPHERAL VASCULAR CATHETERIZATION Right 03/19/2016   Procedure: Dialysis/Perma Catheter Insertion;  Surgeon: Katha Cabal, MD;  Location: [facility];  Service: Cardiovascular;  Laterality: Right;  . PERIPHERAL VASCULAR CATHETERIZATION N/A 04/22/2016   Procedure: Dialysis/Perma Catheter Removal;  Surgeon: Algernon Huxley, MD;  Location: [facility];  Service: Cardiovascular;   Laterality: N/A;    Social History   Social History  . Marital status: Legally Separated    Spouse name: N/A  . Number of children: N/A  . Years of education: N/A   Occupational History  . Not on file.   Social History Main Topics  . Smoking status: Never Smoker  . Smokeless tobacco: Never Used  . Alcohol use No  . Drug use:     Types: Marijuana     Comment: Occasionally  . Sexual activity: Not on file   Other Topics Concern  . Not on file   Social History Narrative  . No narrative on file    Family History  Problem Relation Age of Onset  . Hypertension Other   . Diabetes Other     Allergies as of 07/16/2016 - Review Complete 07/16/2016  Allergen Reaction Noted  . Penicillins Other (See Comments) 10/18/2012    No current facility-administered medications on file prior to encounter.    Current Outpatient Prescriptions on File Prior to Encounter  Medication Sig Dispense Refill  . acetaminophen (TYLENOL) 325 MG tablet Take 650 mg by mouth every 6 (six) hours as needed for mild pain, fever or headache.    Marland Kitchen amiodarone (PACERONE) 400 MG tablet Take 1 tablet (400 mg total) by mouth daily. 30 tablet 0  . aspirin 81 MG chewable tablet Chew 1 tablet (81 mg total) by mouth daily. 30 tablet 11  . b complex-vitamin c-folic acid (NEPHRO-VITE) 0.8 MG TABS tablet  Take 1 tablet by mouth 2 (two) times daily.    . Capsaicin (CAPSAGEL) 0.025 % GEL Apply 1 application topically 2 (two) times daily as needed (for lower back pain).    Marland Kitchen ipratropium-albuterol (DUONEB) 0.5-2.5 (3) MG/3ML SOLN Take 3 mLs by nebulization every 6 (six) hours as needed (for wheezing/shortness of breath).    . lamoTRIgine (LAMICTAL) 25 MG tablet Take 50 mg by mouth daily.    Marland Kitchen levothyroxine (SYNTHROID, LEVOTHROID) 75 MCG tablet Take 75 mcg by mouth daily before breakfast.     . loratadine (CLARITIN) 10 MG tablet Take 1 tablet (10 mg total) by mouth daily. 30 tablet 11  . metoprolol tartrate (LOPRESSOR) 25 MG  tablet Take 1 tablet (25 mg total) by mouth 2 (two) times daily.    . mirtazapine (REMERON) 15 MG tablet Take 15 mg by mouth at bedtime.    . polyethylene glycol (MIRALAX / GLYCOLAX) packet Take 17 g by mouth daily as needed for mild constipation.     . sevelamer (RENAGEL) 800 MG tablet Take 2,400 mg by mouth 3 (three) times daily with meals.    . Travoprost, BAK Free, (TRAVATAN) 0.004 % SOLN ophthalmic solution Place 1 drop into both eyes at bedtime.       REVIEW OF SYSTEMS: Unable to obtain due to mental status  PHYSICAL EXAMINATION: General: The patient appears their stated age.  Vital signs are BP (!) 129/92   Pulse 84   Temp 98 F (36.7 C)   Resp 14   Ht 5\' 7"  (1.702 m)   Wt 200 lb (90.7 kg)   SpO2 97%   BMI 31.32 kg/m  Pulmonary: Respirations are mildly labored HEENT:  No gross abnormalities Abdomen: Soft and non-tender  Musculoskeletal: There are no major deformities.   Skin: dry eschars over several areas of his left AVF Psychiatric: The patient has normal affect. Cardiovascular: There is a regular rate and rhythm without significant murmur appreciated.  Diagnostic Studies: I have reviewed his previous fistulograms.  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  Keep NPO after midnight.  I discussed with IR , and they will place a permacath tonight.  His medication list says he is on Eliquis, I will need to verify he is not taking this in order to proceed with surgery tomorrow     V. Leia Alf, M.D. [name] Office: [phone] Pager:  [phone]

## 2016-07-16 NOTE — ED Triage Notes (Signed)
PT sent from [facility] after not having dialysis since Saturday and a possibly infected dialysis graft on left upper arm.

## 2016-07-16 NOTE — Progress Notes (Signed)
[facility]  Patient name: Noah Lewis MRN: [id number] DOB: Mar 26, 1951 Sex: male  REASON FOR CONSULT: painful left arm fistula, consult is from EDP.  HPI: Satya Heady is a 65 y.o. male with ESRD on HD TTS who presents with a painful left arm brachiocephalic AV fistula. He was recently seen in the [facility] office on 07/12/16 by Dr. Bridgett Larsson due to concerns for possible bleeding given erosions overlying his fistula. His dialysis access has been previously managed at [facility]. Dr. Bridgett Larsson felt that his fistula was not salvageable and scheduled him for ligation of his left arm fistula tomorrow with Dr. Trula Slade.   He was supposedly dialyzing via a femoral TDC which is not present today. He has not had dialysis since Saturday.  Past Medical History:  Diagnosis Date  . Arthritis   . Asthma   . Cancer ([place])   . COPD (chronic obstructive pulmonary disease) ([place])   . Coronary artery disease   . Diabetes mellitus without complication ([place])   . Hypertension   . Renal disorder     Family History  Problem Relation Age of Onset  . Hypertension Other   . Diabetes Other     SOCIAL HISTORY: Social History  Substance Use Topics  . Smoking status: Never Smoker  . Smokeless tobacco: Never Used  . Alcohol use No    Allergies  Allergen Reactions  . Penicillins Other (See Comments)    Reaction:  Unknown     Current Facility-Administered Medications  Medication Dose Route Frequency Provider Last Rate Last Dose  . heparin 1000 UNIT/ML injection           . lidocaine-EPINEPHrine (XYLOCAINE-EPINEPHrine) 1 %-1:200000 (PF) injection            Current Outpatient Prescriptions  Medication Sig Dispense Refill  . acetaminophen (TYLENOL) 325 MG tablet Take 650 mg by mouth every 6 (six) hours as needed for mild pain, fever or headache.    Marland Kitchen amiodarone (PACERONE) 400 MG tablet Take 1 tablet (400 mg total) by mouth daily. 30 tablet 0  . apixaban (ELIQUIS) 5 MG  TABS tablet Take 5 mg by mouth 2 (two) times daily.    Marland Kitchen aspirin 81 MG chewable tablet Chew 1 tablet (81 mg total) by mouth daily. 30 tablet 11  . b complex-vitamin c-folic acid (NEPHRO-VITE) 0.8 MG TABS tablet Take 1 tablet by mouth 2 (two) times daily.    . Capsaicin (CAPSAGEL) 0.025 % GEL Apply 1 application topically 2 (two) times daily as needed (for lower back pain).    Marland Kitchen doxycycline (VIBRAMYCIN) 100 MG capsule Take 100 mg by mouth 2 (two) times daily.    Marland Kitchen ipratropium-albuterol (DUONEB) 0.5-2.5 (3) MG/3ML SOLN Take 3 mLs by nebulization every 6 (six) hours as needed (for wheezing/shortness of breath).    . lamoTRIgine (LAMICTAL) 25 MG tablet Take 50 mg by mouth daily.    Marland Kitchen levothyroxine (SYNTHROID, LEVOTHROID) 75 MCG tablet Take 75 mcg by mouth daily before breakfast.     . loratadine (CLARITIN) 10 MG tablet Take 1 tablet (10 mg total) by mouth daily. 30 tablet 11  . metoprolol tartrate (LOPRESSOR) 25 MG tablet Take 1 tablet (25 mg total) by mouth 2 (two) times daily.    . mirtazapine (REMERON) 15 MG tablet Take 15 mg by mouth at bedtime.    . polyethylene glycol (MIRALAX / GLYCOLAX) packet Take 17 g by mouth daily as needed for mild constipation.     Maye Hides  boulardii (FLORASTOR) 250 MG capsule Take 250 mg by mouth 2 (two) times daily.    . sevelamer (RENAGEL) 800 MG tablet Take 2,400 mg by mouth 3 (three) times daily with meals.    . Travoprost, BAK Free, (TRAVATAN) 0.004 % SOLN ophthalmic solution Place 1 drop into both eyes at bedtime.      REVIEW OF SYSTEMS:  [X]  denotes positive finding, [ ]  denotes negative finding Cardiac  Comments:  Chest pain or chest pressure:    Shortness of breath upon exertion:    Short of breath when lying flat:    Irregular heart rhythm:        Vascular    Pain in calf, thigh, or hip brought on by ambulation:    Pain in feet at night that wakes you up from your sleep:     Blood clot in your veins:    Leg swelling:         Pulmonary      Oxygen at home:    Productive cough:     Wheezing:         Neurologic    Sudden weakness in arms or legs:     Sudden numbness in arms or legs:     Sudden onset of difficulty speaking or slurred speech:    Temporary loss of vision in one eye:     Problems with dizziness:         Gastrointestinal    Blood in stool:     Vomited blood:         Genitourinary    Burning when urinating:     Blood in urine:        Psychiatric    Major depression:         Hematologic    Bleeding problems:    Problems with blood clotting too easily:        Skin    Rashes or ulcers:        Constitutional    Fever or chills:      PHYSICAL EXAM: Vitals:   07/16/16 1415 07/16/16 1430 07/16/16 1500 07/16/16 1540  BP:  (!) 156/103 (!) 166/105 (!) 169/109  Pulse:  85 77 85  Resp:  20 26 17   Temp:  98 F (36.7 C)    TempSrc:  Oral    SpO2: 90% 93% 99% 95%  Weight: 200 lb (90.7 kg)     Height: 5\' 7"  (1.702 m)       GENERAL: The patient is a lethargic male, in no acute distress. The vital signs are documented above. CARDIAC: There is a regular rate and rhythm.  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 No bleeding. No erythema, no drainage.  PULMONARY: Non labored respiratory effort.  NEUROLOGIC: No focal weakness or paresthesias are detected. SKIN: see vascular section above.   MEDICAL ISSUES: Painful and degenerative left upper arm fistula  TDC placement today by IR then HD. Dr. Trula Slade to review previous fistulograms to determine whether access is salvageable. Otherwise, plan on ligation of left upper arm fistula tomorrow.    Virgina Jock, PA-C [facility] [place]

## 2016-07-16 NOTE — Progress Notes (Signed)
Patient ID: Noah Lewis, male   DOB: [birth year], 65 y.o.   MRN: [id number]   Location:   [facility] Room Number: 215-B Place of Service:  SNF (31)   CODE STATUS: Full Code  Allergies  Allergen Reactions  . Penicillins Other (See Comments)    Reaction:  Unknown     Chief Complaint  Patient presents with  . Medical Management of Chronic Issues    Follow up    HPI:  He is a long term resident of this facility being seen for the management of his chronic illnesses. He is having significant pain and swelling in his left arm. He is due for dialysis today but is declining to go; due to the pain in his arm. He is willing to got the ED for further evaluation.    Past Medical History:  Diagnosis Date  . Arthritis   . Asthma   . Cancer ([place])   . COPD (chronic obstructive pulmonary disease) ([place])   . Coronary artery disease   . Diabetes mellitus without complication ([place])   . Hypertension   . Renal disorder     Past Surgical History:  Procedure Laterality Date  . INSERTION OF DIALYSIS CATHETER    . INSERTION OF DIALYSIS CATHETER N/A 05/29/2015   Procedure: INSERTION OF DIALYSIS CATHETER;  Surgeon: Angelia Mould, MD;  Location: [facility];  Service: Vascular;  Laterality: N/A;  . NO PAST SURGERIES    . PERIPHERAL VASCULAR CATHETERIZATION N/A 10/25/2015   Procedure: Dialysis/Perma Catheter Insertion;  Surgeon: Katha Cabal, MD;  Location: [facility];  Service: Cardiovascular;  Laterality: N/A;  . PERIPHERAL VASCULAR CATHETERIZATION Left 02/22/2016   Procedure: A/V Shuntogram/Fistulagram;  Surgeon: Algernon Huxley, MD;  Location: [facility];  Service: Cardiovascular;  Laterality: Left;  . PERIPHERAL VASCULAR CATHETERIZATION Right 03/19/2016   Procedure: Dialysis/Perma Catheter Insertion;  Surgeon: Katha Cabal, MD;  Location: [facility];  Service: Cardiovascular;  Laterality: Right;  . PERIPHERAL VASCULAR CATHETERIZATION N/A 04/22/2016    Procedure: Dialysis/Perma Catheter Removal;  Surgeon: Algernon Huxley, MD;  Location: [facility];  Service: Cardiovascular;  Laterality: N/A;    Social History   Social History  . Marital status: Legally Separated    Spouse name: N/A  . Number of children: N/A  . Years of education: N/A   Occupational History  . Not on file.   Social History Main Topics  . Smoking status: Never Smoker  . Smokeless tobacco: Never Used  . Alcohol use No  . Drug use:     Types: Marijuana     Comment: Occasionally  . Sexual activity: Not on file   Other Topics Concern  . Not on file   Social History Narrative  . No narrative on file   Family History  Problem Relation Age of Onset  . Hypertension Other   . Diabetes Other       VITAL SIGNS BP (!) 168/74   Pulse 76   Temp 97.9 F (36.6 C) (Oral)   Resp 18   Ht 5\' 7"  (1.702 m)   Wt 200 lb 2 oz (90.8 kg)   SpO2 (!) 82%   BMI 31.34 kg/m   Patient's Medications  New Prescriptions   No medications on file  Previous Medications   ACETAMINOPHEN (TYLENOL) 325 MG TABLET    Take 650 mg by mouth every 6 (six) hours as needed for mild pain, fever or headache.   AMIODARONE (  PACERONE) 400 MG TABLET    Take 1 tablet (400 mg total) by mouth daily.   ASPIRIN 81 MG CHEWABLE TABLET    Chew 1 tablet (81 mg total) by mouth daily.   B COMPLEX-VITAMIN C-FOLIC ACID (NEPHRO-VITE) 0.8 MG TABS TABLET    Take 1 tablet by mouth 2 (two) times daily.   CAPSAICIN (CAPSAGEL) 0.025 % GEL    Apply 1 application topically 2 (two) times daily as needed (for lower back pain).   IPRATROPIUM-ALBUTEROL (DUONEB) 0.5-2.5 (3) MG/3ML SOLN    Take 3 mLs by nebulization every 6 (six) hours as needed (for wheezing/shortness of breath).   LAMOTRIGINE (LAMICTAL) 25 MG TABLET    Take 50 mg by mouth daily.   LEVOTHYROXINE (SYNTHROID, LEVOTHROID) 75 MCG TABLET    Take 75 mcg by mouth daily before breakfast.    LORATADINE (CLARITIN) 10 MG TABLET    Take 1 tablet (10 mg  total) by mouth daily.   METOPROLOL TARTRATE (LOPRESSOR) 25 MG TABLET    Take 1 tablet (25 mg total) by mouth 2 (two) times daily.   MIRTAZAPINE (REMERON) 15 MG TABLET    Take 15 mg by mouth at bedtime.   POLYETHYLENE GLYCOL (MIRALAX / GLYCOLAX) PACKET    Take 17 g by mouth daily as needed for mild constipation.    SEVELAMER (RENAGEL) 800 MG TABLET    Take 2,400 mg by mouth 3 (three) times daily with meals.   TRAVOPROST, BAK FREE, (TRAVATAN) 0.004 % SOLN OPHTHALMIC SOLUTION    Place 1 drop into both eyes at bedtime.  Modified Medications   No medications on file  Discontinued Medications   APIXABAN (ELIQUIS) 5 MG TABS TABLET    Take 5 mg by mouth 2 (two) times daily.      SIGNIFICANT DIAGNOSTIC EXAMS  04-28-15: 2-d echo: Left ventricle: The cavity size was normal. Wall thickness was increased in a pattern of mild LVH. The estimated ejection fraction was 50%. Diffuse hypokinesis. Indeterminant diastolic function (atrial fibrillation). - Aortic valve: There was no stenosis. There was mild regurgitation. - Mitral valve: Mildly calcified annulus. There was mild regurgitation. - Left atrium: The atrium was moderately dilated. - Right ventricle: Poorly visualized. The cavity size was mildly dilated. Systolic function was mildly reduced. - Right atrium: The atrium was mildly dilated. - Tricuspid valve: Peak RV-RA gradient (S): 29 mm Hg. - Pulmonary arteries: PA peak pressure: 44 mm Hg (S). - Systemic veins: IVC measured 2.6 with < 50% respirophasic variation, suggesting RA pressure 15 mmHg.  05-08-15: lumbar spine x-ray: No acute findings. Mild spondylosis of the lumbar spine with disc disease at the L5-S1 level. Moderate degenerative change of the hips.  05-12-15: chest x-ray: Cardiomegaly with mild interstitial pulmonary edema.   05-30-15: chest x-ray: Left lower lung field atelectasis versus pneumonia. Clinical correlation and follow-up recommended.   05-31-15: ct of chest: 1. 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 2. Soft tissue swelling in the LEFT chest wall likely related to venous occlusion. There is prominent adenopathy in LEFT axilla and sub pectoralis location. Presumably this relates to venous occlusion. Cannot exclude a malignant process but is felt less likely. The adenopathy and swelling is new from 08/15/2014 3. Central venous line from a inferior approach with tip is in the RIGHT atrium and distal SVC. 4. Diffuse ground-glass opacities not changed from prior. 5. Resolution of LEFT upper lobe pulmonary nodule.  Ureteral   06-02-15: left upper arm shuntogram: Status post left upper extremity  fistulagram demonstrating occluded stent in the subclavian vein at the level of the left clavicular head, as a cause for increased venous pressure within the left upper extremity.    LABS REVIEWED:   05-30-15: wbc 5.1; hgb 9.1; hct 28.9; mcv 84.5; plt 154; glucose 85 bun 95; creat 13.13; k+ 6.0; na++139; mag 2.2; ferritin 406; iron 29; tibc 209; HIV: nonreactive 06-02-15: wbc 5.9; hgb 9.6; hct 31.0; mcv 86.4; plt 147; glucose 87; bun 35; creat 7.64; k+ 3.9; na++136; phos 4.7; albumin 3.4; hep C AB>11.0; hep B Ag: neg 05-03-16: wbc 6.9; hgb 10.3; ht 33.2; mcv 90.7 plt 227; glucose 140; bun 98.3; creat 15.76; k+ 5.0; na++ 141; liver normal albumin 3.9; chol 101; ldl 35; trig 77; hdl 51; tsh 16.21; mag 2.4; phos 7.0  06-12-16: wbc 6.3; hgb 10.9; hct 33.6; mcv 88.4; plt 184; glucose 82; bun 59.6; creat 15.00; k+ 5.4; na++ 142; liver normal albumin 4.3 TSH 16.62     Review of Systems Constitutional: Negative for appetite change and fatigue.  Respiratory: Negative for cough, chest tightness and shortness of breath.   Cardiovascular: Negative for chest pain and leg swelling.  Gastrointestinal: Negative for abdominal pain and constipation.  Musculoskeletal: severe pain and swelling in left arm.  Skin: Negative for pallor.  Psychiatric/Behavioral: The patient is not  nervous/anxious.     Physical Exam Vitals reviewed. Constitutional: No distress.  Overweight   Eyes: Conjunctivae are normal.  Neck: Neck supple. No JVD present. No thyromegaly present.  Cardiovascular: Normal rate, regular rhythm and intact distal pulses.   Respiratory: has scattered wheezes .  GI: Soft. Bowel sounds are normal. He exhibits no distension. There is no tenderness.  Musculoskeletal: He exhibits no edema.  Able to move all extremities   Lymphadenopathy:    He has no cervical adenopathy.  Neurological: He is alert.  Skin: Skin is warm and dry. He is not diaphoretic.  left upper arm + thrill +bruit. There is redness; warmth and swelling in his right arm.  Psychiatric: He has a normal mood and affect.     ASSESSMENT/ PLAN:  1. Hypertension: will continue lopressor 25 mg twice daily   2. Chronic afib; will continue asa 81 mg daily and is on amoidarone 400 mg daily and  lopressor 25 mg twice daily for rate control   3. COPD: will continue duoneb every 6 hours as needed and takes claritin 10 mg daily   4. Hypothyroidism: will continue synthroid 75 mcg daily tsh was 16.62; follow up labs pending  5. bipolar depression: will continue remeron 15 mg nightly and will continue lamictal 50 mg daily to stabilize mood.  6. Anemia of chronic kidney disease: hgb is 10.9  7.ESRD: is on hemodialysis three times weekly; will continue renagel 240 mg three times daily   8. Left arm swelling and pain: will send him the ED for further evaluation and treatment options. He was unable to go to dialysis due to his pain.     Time spent with patient 45   minutes >50% time spent counseling; reviewing medical record; tests; labs; and developing future plan of care       Ok Edwards NP [facility]  Contact [phone] Monday through Friday 8am- 5pm  After hours call [phone]

## 2016-07-16 NOTE — Progress Notes (Signed)
Heparin Drip started as ordered.

## 2016-07-16 NOTE — ED Notes (Signed)
Refusing IV stick after drawing blood.

## 2016-07-16 NOTE — Procedures (Signed)
1. RUE venogram 2. R femoral tunneled HD catheter placement  No complication No blood loss. See complete dictation in [facility].

## 2016-07-16 NOTE — Progress Notes (Signed)
ANTICOAGULATION CONSULT NOTE - Initial Consult  Pharmacy Consult for Heparin Indication: atrial fibrillation  Allergies  Allergen Reactions  . Penicillins Other (See Comments)    Reaction:  Unknown     Patient Measurements: Height: 5\' 7"  (170.2 cm) Weight:  (UTA, ED stretcher) IBW/kg (Calculated) : 66.1 Heparin Dosing Weight: 85.1  Vital Signs: Temp: 98 F (36.7 C) (10/24 1825) Temp Source: Oral (10/24 1430) BP: 181/132 (10/24 1930) Pulse Rate: 91 (10/24 1930)  Labs:  Recent Labs  07/16/16 1417  HGB 11.9*  HCT 37.3*  PLT 205  CREATININE 17.91*    Estimated Creatinine Clearance: 4.4 mL/min (by C-G formula based on SCr of 17.91 mg/dL (H)).   Medical History: Past Medical History:  Diagnosis Date  . Arthritis   . Asthma   . Cancer ([place])   . COPD (chronic obstructive pulmonary disease) ([place])   . Coronary artery disease   . Diabetes mellitus without complication ([place])   . Hypertension   . Renal disorder    Medications:  Prescriptions Prior to Admission  Medication Sig Dispense Refill Last Dose  . acetaminophen (TYLENOL) 325 MG tablet Take 650 mg by mouth every 6 (six) hours as needed for mild pain, fever or headache.   unk at [employer]  . amiodarone (PACERONE) 400 MG tablet Take 1 tablet (400 mg total) by mouth daily. 30 tablet 0 07/16/2016 at Unknown time  . apixaban (ELIQUIS) 5 MG TABS tablet Take 5 mg by mouth 2 (two) times daily.   Past Week at 2100  . aspirin 81 MG chewable tablet Chew 1 tablet (81 mg total) by mouth daily. 30 tablet 11 07/16/2016 at 0900  . b complex-vitamin c-folic acid (NEPHRO-VITE) 0.8 MG TABS tablet Take 1 tablet by mouth 2 (two) times daily.   07/16/2016 at Unknown time  . Capsaicin (CAPSAGEL) 0.025 % GEL Apply 1 application topically 2 (two) times daily as needed (for lower back pain).   unk at [employer]  . doxycycline (VIBRAMYCIN) 100 MG capsule Take 100 mg by mouth 2 (two) times daily.   Past Month at Unknown time  . ipratropium-albuterol  (DUONEB) 0.5-2.5 (3) MG/3ML SOLN Take 3 mLs by nebulization every 6 (six) hours as needed (for wheezing/shortness of breath).   07/15/2016 at unk  . lamoTRIgine (LAMICTAL) 25 MG tablet Take 50 mg by mouth daily.   07/16/2016 at Unknown time  . levothyroxine (SYNTHROID, LEVOTHROID) 75 MCG tablet Take 75 mcg by mouth daily before breakfast.    07/16/2016 at Unknown time  . loratadine (CLARITIN) 10 MG tablet Take 1 tablet (10 mg total) by mouth daily. 30 tablet 11 07/16/2016 at Unknown time  . metoprolol tartrate (LOPRESSOR) 25 MG tablet Take 1 tablet (25 mg total) by mouth 2 (two) times daily.   07/16/2016 at 0900  . mirtazapine (REMERON) 15 MG tablet Take 15 mg by mouth at bedtime.   07/15/2016 at Unknown time  . polyethylene glycol (MIRALAX / GLYCOLAX) packet Take 17 g by mouth daily as needed for mild constipation.    unk at unk  . saccharomyces boulardii (FLORASTOR) 250 MG capsule Take 250 mg by mouth 2 (two) times daily.   Past Week at Unknown time  . sevelamer (RENAGEL) 800 MG tablet Take 2,400 mg by mouth 3 (three) times daily with meals.   07/16/2016 at Unknown time  . Travoprost, BAK Free, (TRAVATAN) 0.004 % SOLN ophthalmic solution Place 1 drop into both eyes at bedtime.   07/15/2016 at Unknown time    Assessment:  Noah Lewis is an 65 y.o. male who is ESRD and has been sent to the ED secondary to LUE pain and a concern for possible infection. Prior to admission medication list includes apixaban, however patient unable to confirm exactly when last dose was taken. Patient currently in hemodialysis and has been at the hospital for over 12 hours. Will avoid heparin bolus but proceed with heparin infusion at this time HgB 11.9, Platelets 205.    Goal of Therapy:  Heparin level 0.3-0.7 units/ml Monitor platelets by anticoagulation protocol: Yes   Plan:  Start heparin infusion at 1200 units/hr Check anti-Xa level in 8 hours and daily while on heparin Continue to monitor H&H and  platelets  Noah Lewis 07/16/2016,8:03 PM

## 2016-07-16 NOTE — Consult Note (Signed)
[facility] Renal Consultation Note    Indication for Consultation:  Management of ESRD/hemodialysis; anemia, hypertension/volume and secondary hyperparathyroidism  HPI: Noah Lewis is a 65 y.o. male with ESRD on hemodialysis x 5 years. His medical history also includes DM, HTN, CAD, COPD, substance abuse.  He is noted to have chronic facial edema with h/o of L innom vein occulusion, SVC stenosis. He presented to ED from SNF this morning where he resides after declining to go to HD this am d/t pain in left arm. He was seen by Dr. Bridgett Larsson on 07/12/16 for erosions over L AVF and was planning to have ligation of his fistula. He missed his Sat HD and his last HD was on 07/11/16 for approx 2 hours. He is frequently noncompliant with HD with frequent early sign offs and missed treatments. He was hypoxic on admission with SpO2 82% now improved 95% on nasal canula. He is very groggy on exam and gives no reason for missed treatments.  CXR -with interstitial pulmonary edema no consolidation  Labs K 5.7 Hgb 11.9 WBC 8.2 Lactic acid 2.22   Past Medical History:  Diagnosis Date  . Arthritis   . Asthma   . Cancer ([place])   . COPD (chronic obstructive pulmonary disease) ([place])   . Coronary artery disease   . Diabetes mellitus without complication ([place])   . Hypertension   . Renal disorder    Past Surgical History:  Procedure Laterality Date  . INSERTION OF DIALYSIS CATHETER    . INSERTION OF DIALYSIS CATHETER N/A 05/29/2015   Procedure: INSERTION OF DIALYSIS CATHETER;  Surgeon: Angelia Mould, MD;  Location: [facility];  Service: Vascular;  Laterality: N/A;  . NO PAST SURGERIES    . PERIPHERAL VASCULAR CATHETERIZATION N/A 10/25/2015   Procedure: Dialysis/Perma Catheter Insertion;  Surgeon: Katha Cabal, MD;  Location: [facility];  Service: Cardiovascular;  Laterality: N/A;  . PERIPHERAL VASCULAR CATHETERIZATION Left 02/22/2016   Procedure: A/V Shuntogram/Fistulagram;  Surgeon:  Algernon Huxley, MD;  Location: [facility];  Service: Cardiovascular;  Laterality: Left;  . PERIPHERAL VASCULAR CATHETERIZATION Right 03/19/2016   Procedure: Dialysis/Perma Catheter Insertion;  Surgeon: Katha Cabal, MD;  Location: [facility];  Service: Cardiovascular;  Laterality: Right;  . PERIPHERAL VASCULAR CATHETERIZATION N/A 04/22/2016   Procedure: Dialysis/Perma Catheter Removal;  Surgeon: Algernon Huxley, MD;  Location: [facility];  Service: Cardiovascular;  Laterality: N/A;   Family History  Problem Relation Age of Onset  . Hypertension Other   . Diabetes Other    Social History:  reports that he has never smoked. He has never used smokeless tobacco. He reports that he uses drugs, including Marijuana. He reports that he does not drink alcohol. Allergies  Allergen Reactions  . Penicillins Other (See Comments)    Reaction:  Unknown    Prior to Admission medications   Medication Sig Start Date End Date Taking? Authorizing Provider  acetaminophen (TYLENOL) 325 MG tablet Take 650 mg by mouth every 6 (six) hours as needed for mild pain, fever or headache.   Yes Historical Provider, MD  amiodarone (PACERONE) 400 MG tablet Take 1 tablet (400 mg total) by mouth daily. 12/14/15  Yes Srikar Sudini, MD  apixaban (ELIQUIS) 5 MG TABS tablet Take 5 mg by mouth 2 (two) times daily.   Yes Historical Provider, MD  aspirin 81 MG chewable tablet Chew 1 tablet (81 mg total) by mouth daily. 06/02/15  Yes Maryellen Pile, MD  b complex-vitamin c-folic acid (NEPHRO-VITE) 0.8 MG TABS tablet Take 1 tablet by mouth 2 (two) times daily.   Yes Historical Provider, MD  Capsaicin (CAPSAGEL) 0.025 % GEL Apply 1 application topically 2 (two) times daily as needed (for lower back pain).   Yes Historical Provider, MD  doxycycline (VIBRAMYCIN) 100 MG capsule Take 100 mg by mouth 2 (two) times daily.   Yes Historical Provider, MD  ipratropium-albuterol (DUONEB) 0.5-2.5 (3) MG/3ML SOLN Take 3 mLs  by nebulization every 6 (six) hours as needed (for wheezing/shortness of breath).   Yes Historical Provider, MD  lamoTRIgine (LAMICTAL) 25 MG tablet Take 50 mg by mouth daily.   Yes Historical Provider, MD  levothyroxine (SYNTHROID, LEVOTHROID) 75 MCG tablet Take 75 mcg by mouth daily before breakfast.    Yes Historical Provider, MD  loratadine (CLARITIN) 10 MG tablet Take 1 tablet (10 mg total) by mouth daily. 05/24/15  Yes Gerlene Fee, NP  metoprolol tartrate (LOPRESSOR) 25 MG tablet Take 1 tablet (25 mg total) by mouth 2 (two) times daily. 12/14/15  Yes Srikar Sudini, MD  mirtazapine (REMERON) 15 MG tablet Take 15 mg by mouth at bedtime.   Yes Historical Provider, MD  polyethylene glycol (MIRALAX / GLYCOLAX) packet Take 17 g by mouth daily as needed for mild constipation.    Yes Historical Provider, MD  saccharomyces boulardii (FLORASTOR) 250 MG capsule Take 250 mg by mouth 2 (two) times daily.   Yes Historical Provider, MD  sevelamer (RENAGEL) 800 MG tablet Take 2,400 mg by mouth 3 (three) times daily with meals.   Yes Historical Provider, MD  Travoprost, BAK Free, (TRAVATAN) 0.004 % SOLN ophthalmic solution Place 1 drop into both eyes at bedtime.   Yes Historical Provider, MD   Current Facility-Administered Medications  Medication Dose Route Frequency Provider Last Rate Last Dose  . heparin 1000 UNIT/ML injection           . lidocaine-EPINEPHrine (XYLOCAINE-EPINEPHrine) 1 %-1:200000 (PF) injection            Current Outpatient Prescriptions  Medication Sig Dispense Refill  . acetaminophen (TYLENOL) 325 MG tablet Take 650 mg by mouth every 6 (six) hours as needed for mild pain, fever or headache.    Marland Kitchen amiodarone (PACERONE) 400 MG tablet Take 1 tablet (400 mg total) by mouth daily. 30 tablet 0  . apixaban (ELIQUIS) 5 MG TABS tablet Take 5 mg by mouth 2 (two) times daily.    Marland Kitchen aspirin 81 MG chewable tablet Chew 1 tablet (81 mg total) by mouth daily. 30 tablet 11  . b complex-vitamin c-folic  acid (NEPHRO-VITE) 0.8 MG TABS tablet Take 1 tablet by mouth 2 (two) times daily.    . Capsaicin (CAPSAGEL) 0.025 % GEL Apply 1 application topically 2 (two) times daily as needed (for lower back pain).    Marland Kitchen doxycycline (VIBRAMYCIN) 100 MG capsule Take 100 mg by mouth 2 (two) times daily.    Marland Kitchen ipratropium-albuterol (DUONEB) 0.5-2.5 (3) MG/3ML SOLN Take 3 mLs by nebulization every 6 (six) hours as needed (for wheezing/shortness of breath).    . lamoTRIgine (LAMICTAL) 25 MG tablet Take 50 mg by mouth daily.    Marland Kitchen levothyroxine (SYNTHROID, LEVOTHROID) 75 MCG tablet Take 75 mcg by mouth daily before breakfast.     . loratadine (CLARITIN) 10 MG tablet Take 1 tablet (10 mg total) by mouth daily. 30 tablet 11  . metoprolol tartrate (LOPRESSOR) 25 MG tablet Take 1 tablet (25 mg total) by mouth 2 (two)  times daily.    . mirtazapine (REMERON) 15 MG tablet Take 15 mg by mouth at bedtime.    . polyethylene glycol (MIRALAX / GLYCOLAX) packet Take 17 g by mouth daily as needed for mild constipation.     . saccharomyces boulardii (FLORASTOR) 250 MG capsule Take 250 mg by mouth 2 (two) times daily.    . sevelamer (RENAGEL) 800 MG tablet Take 2,400 mg by mouth 3 (three) times daily with meals.    . Travoprost, BAK Free, (TRAVATAN) 0.004 % SOLN ophthalmic solution Place 1 drop into both eyes at bedtime.     Labs: Basic Metabolic Panel:  Recent Labs Lab 07/16/16 1417  NA 138  K 5.7*  CL 96*  CO2 22  GLUCOSE 110*  BUN 82*  CREATININE 17.91*  CALCIUM 8.3*   Liver Function Tests:  Recent Labs Lab 07/16/16 1417  AST 18  ALT 14*  ALKPHOS 109  BILITOT 0.8  PROT 8.1  ALBUMIN 3.9   No results for input(s): LIPASE, AMYLASE in the last 168 hours. No results for input(s): AMMONIA in the last 168 hours. CBC:  Recent Labs Lab 07/16/16 1417  WBC 8.2  NEUTROABS 6.5  HGB 11.9*  HCT 37.3*  MCV 87.8  PLT 205   Cardiac Enzymes: No results for input(s): CKTOTAL, CKMB, CKMBINDEX, TROPONINI in the last  168 hours. CBG: No results for input(s): GLUCAP in the last 168 hours. Iron Studies: No results for input(s): IRON, TIBC, TRANSFERRIN, FERRITIN in the last 72 hours. Studies/Results: Dg Chest Portable 1 View  Result Date: 07/16/2016 CLINICAL DATA:  Hypoxia EXAM: PORTABLE CHEST 1 VIEW COMPARISON:  06/13/2016 FINDINGS: Diffuse interstitial opacity with occasional Kerley lines. No pleural effusion. Chronic cardiopericardial enlargement. Left brachiocephalic stent again noted. IMPRESSION: Mild pulmonary edema. Electronically Signed   By: Monte Fantasia M.D.   On: 07/16/2016 15:48    ROS: As per HPI otherwise negative.  Physical Exam: Vitals:   07/16/16 1415 07/16/16 1430 07/16/16 1500 07/16/16 1540  BP:  (!) 156/103 (!) 166/105 (!) 169/109  Pulse:  85 77 85  Resp:  20 26 17   Temp:  98 F (36.7 C)    TempSrc:  Oral    SpO2: 90% 93% 99% 95%  Weight: 90.7 kg (200 lb)     Height: 5\' 7"  (1.702 m)        General: WDWN elderly AAF on nasal canula Head: facial edema; conjunctival erythema and edema poor dentition Neck: Supple. Swelling, PCL Lungs:  Lung sounds diminished fine crackles throughout Heart: RRR with S1 S2. Gr 2/6 sEM Abdomen: Bs+ soft ND NT obese, liver down 6 cm Lower extremities:1+ edema or ischemic changes, no open wounds DP 1+ bilat Neuro: Aware but slow, perseverates. CN nonfocal, moves all extrem Psych:  Responds to questions appropriately with a normal affect. Dialysis Access: LUE AVF with erosions/scabbing Long erosion distal and on prox..  Swelling and erythema around erosions.  Very tender adj to erosions    Assessment/Plan: 1.  Volume overload/pulmonary edema -  In setting of missed HD CXR with interstitial edema - for HD today with UF goal 5L ^K, [place] 2.  ESRD -  AVF likely unsalvageable for [facility] placement today HD after insertion  3.  Hypertension/volume  - BP ^ with volume on labetolol and vasotec and metop.  Lower vol and meds 4.  Anemia  - Hgb 11.9 on OP  ESA  5.  Metabolic bone disease -  Ca 8.3/ Check P in am  6.  Nutrition - Albumin 3.9 renal diet 7. Obesity 8. Ongoing substance abuse 9. nonadherence primary issue 10. AVF erosions ? Infx, need to tie off and get cath/new access  Lynnda Child PA-C [place] Pager [phone] 07/16/2016, 4:06 PM I have seen and examined this patient and agree with the plan of care seen, eval,examined, counseled patient.  Discussed with [facility], primary Nephrologist and PA.  Changes above .  Aracely Rickett L 07/16/2016, 4:50 PM

## 2016-07-16 NOTE — H&P (Signed)
History and Physical    Noah Lewis [id number] DOB: Jan 29, 1951  DOA: 07/16/2016 PCP: Elyse Jarvis, MD  Patient coming from: [facility]   Chief Complaint: Left arm swelling and pain and SOB   HPI: Noah Lewis is a 65 y.o. male with medical history significant of ESRD on HD non compliant with HD COPD, HTN uncontrolled, PAF on Eliquis DM and CAD, was sent from his nursing home facility due to SOB and refusing to receive HD due to arm pain. History taken from EMR as patient is severely SOB unable to speak in full sentences and lethargic. Patient has been having trouble with acces on his fistula some erosion and lesions. Patient was seen by Vascular due to erosion over L AVF and was planning to have ligation of his fistula on 07/16/16. His last HD was on 07/11/16.   ED Course: Cr 17.93, Lactic acid 2.23, Initial O2 sat 82% improved with 2L nasal canula to 93%, IR was consulted Perm cath was placed for HD Hospitalits team called for admission.   Review of Systems: Unable to review patient unable to speak in full sentences due to dyspnea and lethargic   Past Medical History:  Diagnosis Date  . Arthritis   . Asthma   . Cancer ([place])   . COPD (chronic obstructive pulmonary disease) ([place])   . Coronary artery disease   . Diabetes mellitus without complication ([place])   . Hypertension   . Renal disorder     Past Surgical History:  Procedure Laterality Date  . INSERTION OF DIALYSIS CATHETER    . INSERTION OF DIALYSIS CATHETER N/A 05/29/2015   Procedure: INSERTION OF DIALYSIS CATHETER;  Surgeon: Angelia Mould, MD;  Location: [place];  Service: Vascular;  Laterality: N/A;  . NO PAST SURGERIES    . PERIPHERAL VASCULAR CATHETERIZATION N/A 10/25/2015   Procedure: Dialysis/Perma Catheter Insertion;  Surgeon: Katha Cabal, MD;  Location: [place] [facility];  Service: Cardiovascular;  Laterality: N/A;  . PERIPHERAL VASCULAR CATHETERIZATION Left 02/22/2016   Procedure:  A/V Shuntogram/Fistulagram;  Surgeon: Algernon Huxley, MD;  Location: [place] [facility];  Service: Cardiovascular;  Laterality: Left;  . PERIPHERAL VASCULAR CATHETERIZATION Right 03/19/2016   Procedure: Dialysis/Perma Catheter Insertion;  Surgeon: Katha Cabal, MD;  Location: [place] [facility];  Service: Cardiovascular;  Laterality: Right;  . PERIPHERAL VASCULAR CATHETERIZATION N/A 04/22/2016   Procedure: Dialysis/Perma Catheter Removal;  Surgeon: Algernon Huxley, MD;  Location: [place] [facility];  Service: Cardiovascular;  Laterality: N/A;     reports that he has never smoked. He has never used smokeless tobacco. He reports that he uses drugs, including Marijuana. He reports that he does not drink alcohol.  Allergies  Allergen Reactions  . Penicillins Other (See Comments)    Reaction:  Unknown     Family History  Problem Relation Age of Onset  . Hypertension Other   . Diabetes Other    Family history reviewed and not pertinent   Prior to Admission medications   Medication Sig Start Date End Date Taking? Authorizing Provider  acetaminophen (TYLENOL) 325 MG tablet Take 650 mg by mouth every 6 (six) hours as needed for mild pain, fever or headache.   Yes Historical Provider, MD  amiodarone (PACERONE) 400 MG tablet Take 1 tablet (400 mg total) by mouth daily. 12/14/15  Yes Srikar Sudini, MD  apixaban (ELIQUIS) 5 MG TABS tablet Take 5 mg by mouth 2 (two) times daily.   Yes Historical Provider, MD  aspirin 81 MG chewable tablet Chew 1 tablet (81 mg total) by mouth daily. 06/02/15  Yes Maryellen Pile, MD  b complex-vitamin c-folic acid (NEPHRO-VITE) 0.8 MG TABS tablet Take 1 tablet by mouth 2 (two) times daily.   Yes Historical Provider, MD  Capsaicin (CAPSAGEL) 0.025 % GEL Apply 1 application topically 2 (two) times daily as needed (for lower back pain).   Yes Historical Provider, MD  doxycycline (VIBRAMYCIN) 100 MG capsule Take 100 mg by mouth 2 (two) times daily.   Yes Historical  Provider, MD  ipratropium-albuterol (DUONEB) 0.5-2.5 (3) MG/3ML SOLN Take 3 mLs by nebulization every 6 (six) hours as needed (for wheezing/shortness of breath).   Yes Historical Provider, MD  lamoTRIgine (LAMICTAL) 25 MG tablet Take 50 mg by mouth daily.   Yes Historical Provider, MD  levothyroxine (SYNTHROID, LEVOTHROID) 75 MCG tablet Take 75 mcg by mouth daily before breakfast.    Yes Historical Provider, MD  loratadine (CLARITIN) 10 MG tablet Take 1 tablet (10 mg total) by mouth daily. 05/24/15  Yes Gerlene Fee, NP  metoprolol tartrate (LOPRESSOR) 25 MG tablet Take 1 tablet (25 mg total) by mouth 2 (two) times daily. 12/14/15  Yes Srikar Sudini, MD  mirtazapine (REMERON) 15 MG tablet Take 15 mg by mouth at bedtime.   Yes Historical Provider, MD  polyethylene glycol (MIRALAX / GLYCOLAX) packet Take 17 g by mouth daily as needed for mild constipation.    Yes Historical Provider, MD  saccharomyces boulardii (FLORASTOR) 250 MG capsule Take 250 mg by mouth 2 (two) times daily.   Yes Historical Provider, MD  sevelamer (RENAGEL) 800 MG tablet Take 2,400 mg by mouth 3 (three) times daily with meals.   Yes Historical Provider, MD  Travoprost, BAK Free, (TRAVATAN) 0.004 % SOLN ophthalmic solution Place 1 drop into both eyes at bedtime.   Yes Historical Provider, MD    Physical Exam: Vitals:   07/16/16 1415 07/16/16 1430 07/16/16 1500 07/16/16 1540  BP:  (!) 156/103 (!) 166/105 (!) 169/109  Pulse:  85 77 85  Resp:  20 26 17   Temp:  98 F (36.7 C)    TempSrc:  Oral    SpO2: 90% 93% 99% 95%  Weight: 90.7 kg (200 lb)     Height: 5\' 7"  (1.702 m)        Constitutional: Mild distress, ill appearing, Lethargic  Eyes: PERRL ENMT: Mucous membranes are moist.  Neck: normal, supple, no masses, no thyromegaly Respiratory: Rales bilaterally diffuse, diminish air entry throughout Cardiovascular: IRR IRR systolic murmur 2/6, NO ruubs / gallops. Trace edema extremity edema. 2+ pedal pulses. No carotid  bruits.  Abdomen: Obese, no tenderness Hepatomegaly noted 4-5cm below costal angle. Bowel sounds positive.  Musculoskeletal: Left upper extremity with dialysis fistula with scabbed lesion which is diffusely tender and swelling good thrill. no clubbing / cyanosis. Good ROM, no contractures. Normal muscle tone.  Skin: no rashes, lesions, ulcers. No induration Neurologic: Lethargic easily arausable AO x 3, CN 2-12 grossly intact. Sensation intact, DTR normal. Psychiatric: Unable to evaluate   Labs on Admission: I have personally reviewed following labs and imaging studies  CBC:  Recent Labs Lab 07/16/16 1417  WBC 8.2  NEUTROABS 6.5  HGB 11.9*  HCT 37.3*  MCV 87.8  PLT 99991111   Basic Metabolic Panel:  Recent Labs Lab 07/16/16 1417  NA 138  K 5.7*  CL 96*  CO2 22  GLUCOSE 110*  BUN 82*  CREATININE 17.91*  CALCIUM 8.3*  GFR: Estimated Creatinine Clearance: 4.4 mL/min (by C-G formula based on SCr of 17.91 mg/dL (H)). Liver Function Tests:  Recent Labs Lab 07/16/16 1417  AST 18  ALT 14*  ALKPHOS 109  BILITOT 0.8  PROT 8.1  ALBUMIN 3.9   No results for input(s): LIPASE, AMYLASE in the last 168 hours. No results for input(s): AMMONIA in the last 168 hours. Coagulation Profile: No results for input(s): INR, PROTIME in the last 168 hours. Cardiac Enzymes: No results for input(s): CKTOTAL, CKMB, CKMBINDEX, TROPONINI in the last 168 hours. BNP (last 3 results) No results for input(s): PROBNP in the last 8760 hours. HbA1C: No results for input(s): HGBA1C in the last 72 hours. CBG: No results for input(s): GLUCAP in the last 168 hours. Lipid Profile: No results for input(s): CHOL, HDL, LDLCALC, TRIG, CHOLHDL, LDLDIRECT in the last 72 hours. Thyroid Function Tests: No results for input(s): TSH, T4TOTAL, FREET4, T3FREE, THYROIDAB in the last 72 hours. Anemia Panel: No results for input(s): VITAMINB12, FOLATE, FERRITIN, TIBC, IRON, RETICCTPCT in the last 72 hours. Urine  analysis:    Component Value Date/Time   COLORURINE YELLOW 11/22/2007 1424   APPEARANCEUR CLEAR 11/22/2007 1424   LABSPEC 1.020 11/22/2007 1424   PHURINE 5.5 11/22/2007 1424   GLUCOSEU NEGATIVE 11/22/2007 1424   HGBUR NEGATIVE 11/22/2007 1424   BILIRUBINUR NEGATIVE 11/22/2007 1424   KETONESUR NEGATIVE 11/22/2007 1424   PROTEINUR 100 (A) 11/22/2007 1424   UROBILINOGEN 1.0 11/22/2007 1424   NITRITE NEGATIVE 11/22/2007 1424   LEUKOCYTESUR NEGATIVE 11/22/2007 1424   Sepsis Labs: !!!!!!!!!!!!!!!!!!!!!!!!!!!!!!!!!!!!!!!!!!!! @LABRCNTIP (procalcitonin:4,lacticidven:4) )No results found for this or any previous visit (from the past 240 hour(s)).   Radiological Exams on Admission: Dg Chest Portable 1 View  Result Date: 07/16/2016 CLINICAL DATA:  Hypoxia EXAM: PORTABLE CHEST 1 VIEW COMPARISON:  06/13/2016 FINDINGS: Diffuse interstitial opacity with occasional Kerley lines. No pleural effusion. Chronic cardiopericardial enlargement. Left brachiocephalic stent again noted. IMPRESSION: Mild pulmonary edema. Electronically Signed   By: Monte Fantasia M.D.   On: 07/16/2016 15:48    EKG: Independently reviewed. Afib   Assessment/Plan  Pulmonary Edema - 2/2 to fluid overload due to missing dialysis.   -Admit to SDU -For HD arranged by renal - appreciated  -O2 Supplement as needed -If continues to desat on O2 consider BiPAP  ESRD on HD  w [facility] - should improve with dialysis -Repeat BMP in AM  -HD per renal   Hypertension - 2/2 to overload  -Continue Labetalol, Amlodipine -Should improve with HD  -Hydralazine PRN   Anemia of chronic diseases - stable  -Monitor CBC in AM   PAF on Eliquis at home, HR controlled  -Hold Eliquis due to procedure in the AM  -Heparin, d/c before 1-2 hrs before procedure   Erosion of fistula ? Infx - No abx for now no white count, afebrile. -Vascular to plan on ligation of fistula  -If spike fever get blood cultures and start antibiotics   DVT  prophylaxis: Heparin  Code Status: Full  Family Communication: None at bedside Disposition Plan: Anticipate discharge to previous SNF when medically stable  Consults called: Vascular and Nephrology  Admission status: SDU, inpatient    Chipper Oman MD [facility] Pager [phone]  If 7PM-7AM, please contact night-coverage [email or web address] Password [facility]  07/16/2016, 4:24 PM

## 2016-07-16 NOTE — ED Notes (Signed)
Admitting at bedside. dialysis called and will transport pt to floor.

## 2016-07-17 ENCOUNTER — Inpatient Hospital Stay (HOSPITAL_COMMUNITY): Payer: Medicare Other

## 2016-07-17 ENCOUNTER — Ambulatory Visit (HOSPITAL_COMMUNITY): Admission: RE | Admit: 2016-07-17 | Payer: Medicare Other | Source: Ambulatory Visit | Admitting: Surgery

## 2016-07-17 ENCOUNTER — Encounter (HOSPITAL_COMMUNITY)
Admission: EM | Disposition: A | Discharge status: Skilled Nursing Facility | Payer: Self-pay | Source: Home / Self Care | Attending: Family Medicine

## 2016-07-17 ENCOUNTER — Inpatient Hospital Stay (HOSPITAL_COMMUNITY): Payer: Medicare Other | Admitting: Certified Registered Nurse Anesthetist

## 2016-07-17 ENCOUNTER — Encounter (HOSPITAL_COMMUNITY): Payer: Self-pay | Admitting: Interventional Radiology

## 2016-07-17 DIAGNOSIS — N189 Chronic kidney disease, unspecified: Secondary | ICD-10-CM | POA: Diagnosis not present

## 2016-07-17 DIAGNOSIS — N179 Acute kidney failure, unspecified: Secondary | ICD-10-CM | POA: Diagnosis not present

## 2016-07-17 DIAGNOSIS — T82898A Other specified complication of vascular prosthetic devices, implants and grafts, initial encounter: Secondary | ICD-10-CM | POA: Diagnosis not present

## 2016-07-17 DIAGNOSIS — M79602 Pain in left arm: Secondary | ICD-10-CM | POA: Diagnosis not present

## 2016-07-17 HISTORY — PX: LIGATION OF ARTERIOVENOUS  FISTULA: SHX5948

## 2016-07-17 LAB — CBC
HCT: 35.6 % — ABNORMAL LOW (ref 39.0–52.0)
Hemoglobin: 11.6 g/dL — ABNORMAL LOW (ref 13.0–17.0)
MCH: 28.3 pg (ref 26.0–34.0)
MCHC: 32.6 g/dL (ref 30.0–36.0)
MCV: 86.8 fL (ref 78.0–100.0)
PLATELETS: 189 10*3/uL (ref 150–400)
RBC: 4.1 MIL/uL — AB (ref 4.22–5.81)
RDW: 17.2 % — ABNORMAL HIGH (ref 11.5–15.5)
WBC: 7.1 10*3/uL (ref 4.0–10.5)

## 2016-07-17 LAB — RENAL FUNCTION PANEL
ALBUMIN: 3.8 g/dL (ref 3.5–5.0)
Anion gap: 15 (ref 5–15)
BUN: 37 mg/dL — ABNORMAL HIGH (ref 6–20)
CALCIUM: 8.3 mg/dL — AB (ref 8.9–10.3)
CO2: 26 mmol/L (ref 22–32)
CREATININE: 11.27 mg/dL — AB (ref 0.61–1.24)
Chloride: 95 mmol/L — ABNORMAL LOW (ref 101–111)
GFR, EST AFRICAN AMERICAN: 5 mL/min — AB (ref >60)
GFR, EST NON AFRICAN AMERICAN: 4 mL/min — AB (ref >60)
Glucose, Bld: 81 mg/dL (ref 65–99)
Phosphorus: 7.1 mg/dL — ABNORMAL HIGH (ref 2.5–4.6)
Potassium: 4.7 mmol/L (ref 3.5–5.1)
SODIUM: 136 mmol/L (ref 135–145)

## 2016-07-17 LAB — GLUCOSE, CAPILLARY
Glucose-Capillary: 86 mg/dL (ref 65–99)
Glucose-Capillary: 119 mg/dL — ABNORMAL HIGH (ref 65–99)

## 2016-07-17 LAB — PROTIME-INR
INR: 1.2
Prothrombin Time: 15.3 seconds — ABNORMAL HIGH (ref 11.4–15.2)

## 2016-07-17 LAB — HEPARIN LEVEL (UNFRACTIONATED): HEPARIN UNFRACTIONATED: 0.16 [IU]/mL — AB (ref 0.30–0.70)

## 2016-07-17 LAB — APTT: aPTT: 38 seconds — ABNORMAL HIGH (ref 24–36)

## 2016-07-17 LAB — MRSA PCR SCREENING: MRSA BY PCR: NEGATIVE

## 2016-07-17 LAB — PROCALCITONIN: PROCALCITONIN: 0.29 ng/mL

## 2016-07-17 SURGERY — LIGATION OF ARTERIOVENOUS  FISTULA
Anesthesia: Monitor Anesthesia Care | Site: Arm Upper | Laterality: Left

## 2016-07-17 MED ORDER — LABETALOL HCL 5 MG/ML IV SOLN
5.0000 mg | INTRAVENOUS | Status: DC | PRN
  Administered 2016-07-17: 5 mg via INTRAVENOUS
  Filled 2016-07-17 (×2): qty 4

## 2016-07-17 MED ORDER — LIDOCAINE HCL (PF) 1 % IJ SOLN: 5.0000 mL | INTRAMUSCULAR | Status: DC | PRN

## 2016-07-17 MED ORDER — PROPOFOL 10 MG/ML IV BOLUS
INTRAVENOUS | Status: DC | PRN
  Administered 2016-07-17 (×3): 20 mg via INTRAVENOUS

## 2016-07-17 MED ORDER — MIDAZOLAM HCL 2 MG/2ML IJ SOLN
INTRAMUSCULAR | Status: AC
  Filled 2016-07-17: qty 2

## 2016-07-17 MED ORDER — SODIUM CHLORIDE 0.9 % IV SOLN
100.0000 mL | INTRAVENOUS | Status: DC | PRN
  Administered 2016-07-22 (×2): via INTRAVENOUS

## 2016-07-17 MED ORDER — HYDROCODONE-ACETAMINOPHEN 5-325 MG PO TABS
ORAL_TABLET | ORAL | Status: AC
  Administered 2016-07-17: 2 via ORAL
  Filled 2016-07-17: qty 2

## 2016-07-17 MED ORDER — FENTANYL CITRATE (PF) 100 MCG/2ML IJ SOLN
INTRAMUSCULAR | Status: DC | PRN
  Administered 2016-07-17: 50 ug via INTRAVENOUS

## 2016-07-17 MED ORDER — HEPARIN SODIUM (PORCINE) 1000 UNIT/ML DIALYSIS: 40.0000 [IU]/kg | Freq: Once | INTRAMUSCULAR | Status: DC

## 2016-07-17 MED ORDER — LIDOCAINE-PRILOCAINE 2.5-2.5 % EX CREA: 1.0000 "application " | TOPICAL_CREAM | CUTANEOUS | Status: DC | PRN

## 2016-07-17 MED ORDER — HEPARIN SODIUM (PORCINE) 1000 UNIT/ML DIALYSIS
1000.0000 [IU] | INTRAMUSCULAR | Status: DC | PRN
  Filled 2016-07-17: qty 1

## 2016-07-17 MED ORDER — LABETALOL HCL 5 MG/ML IV SOLN: 10.0000 mg | INTRAVENOUS | Status: DC | PRN

## 2016-07-17 MED ORDER — FENTANYL CITRATE (PF) 100 MCG/2ML IJ SOLN
INTRAMUSCULAR | Status: AC
  Filled 2016-07-17: qty 2

## 2016-07-17 MED ORDER — ONDANSETRON HCL 4 MG/2ML IJ SOLN: 4.0000 mg | Freq: Once | INTRAMUSCULAR | Status: DC | PRN

## 2016-07-17 MED ORDER — FENTANYL CITRATE (PF) 100 MCG/2ML IJ SOLN: 25.0000 ug | INTRAMUSCULAR | Status: DC | PRN

## 2016-07-17 MED ORDER — SODIUM CHLORIDE 0.9 % IV SOLN
INTRAVENOUS | Status: DC
  Administered 2016-07-17: 13:00:00 via INTRAVENOUS

## 2016-07-17 MED ORDER — LIDOCAINE-EPINEPHRINE (PF) 1 %-1:200000 IJ SOLN
INTRAMUSCULAR | Status: DC | PRN
  Administered 2016-07-17: 30 mL

## 2016-07-17 MED ORDER — VANCOMYCIN HCL 1000 MG IV SOLR
INTRAVENOUS | Status: DC | PRN
  Administered 2016-07-17: 1000 mg via INTRAVENOUS

## 2016-07-17 MED ORDER — PENTAFLUOROPROP-TETRAFLUOROETH EX AERO: 1.0000 "application " | INHALATION_SPRAY | CUTANEOUS | Status: DC | PRN

## 2016-07-17 MED ORDER — VANCOMYCIN HCL IN DEXTROSE 1-5 GM/200ML-% IV SOLN
INTRAVENOUS | Status: AC
  Filled 2016-07-17: qty 200

## 2016-07-17 MED ORDER — ORAL CARE MOUTH RINSE
15.0000 mL | Freq: Two times a day (BID) | OROMUCOSAL | Status: DC
  Administered 2016-07-18 – 2016-07-21 (×5): 15 mL via OROMUCOSAL

## 2016-07-17 MED ORDER — PROPOFOL 500 MG/50ML IV EMUL
INTRAVENOUS | Status: DC | PRN
  Administered 2016-07-17: 75 ug/kg/min via INTRAVENOUS

## 2016-07-17 MED ORDER — ALTEPLASE 2 MG IJ SOLR: 2.0000 mg | Freq: Once | INTRAMUSCULAR | Status: DC | PRN

## 2016-07-17 MED ORDER — 0.9 % SODIUM CHLORIDE (POUR BTL) OPTIME
TOPICAL | Status: DC | PRN
  Administered 2016-07-17: 1000 mL

## 2016-07-17 MED ORDER — SODIUM CHLORIDE 0.9 % IV SOLN: 100.0000 mL | INTRAVENOUS | Status: DC | PRN

## 2016-07-17 SURGICAL SUPPLY — 38 items
ADH SKN CLS APL DERMABOND .7 (GAUZE/BANDAGES/DRESSINGS) ×1
APL SKNCLS STERI-STRIP NONHPOA (GAUZE/BANDAGES/DRESSINGS) ×1
BENZOIN TINCTURE PRP APPL 2/3 (GAUZE/BANDAGES/DRESSINGS) ×2 IMPLANT
CANISTER SUCTION 2500CC (MISCELLANEOUS) ×3 IMPLANT
CLIP TI MEDIUM 6 (CLIP) ×3 IMPLANT
CLIP TI WIDE RED SMALL 6 (CLIP) ×3 IMPLANT
CLOSURE WOUND 1/2 X4 (GAUZE/BANDAGES/DRESSINGS) ×1
DERMABOND ADVANCED (GAUZE/BANDAGES/DRESSINGS) ×2
DERMABOND ADVANCED .7 DNX12 (GAUZE/BANDAGES/DRESSINGS) ×1 IMPLANT
ELECT REM PT RETURN 9FT ADLT (ELECTROSURGICAL) ×3
ELECTRODE REM PT RTRN 9FT ADLT (ELECTROSURGICAL) ×1 IMPLANT
GEL ULTRASOUND 20GR AQUASONIC (MISCELLANEOUS) IMPLANT
GLOVE BIOGEL PI IND STRL 7.5 (GLOVE) ×1 IMPLANT
GLOVE BIOGEL PI INDICATOR 7.5 (GLOVE) ×2
GLOVE SURG SS PI 7.5 STRL IVOR (GLOVE) ×3 IMPLANT
GOWN STRL REUS W/ TWL LRG LVL3 (GOWN DISPOSABLE) ×2 IMPLANT
GOWN STRL REUS W/ TWL XL LVL3 (GOWN DISPOSABLE) ×1 IMPLANT
GOWN STRL REUS W/TWL LRG LVL3 (GOWN DISPOSABLE) ×6
GOWN STRL REUS W/TWL XL LVL3 (GOWN DISPOSABLE) ×3
HEMOSTAT SNOW SURGICEL 2X4 (HEMOSTASIS) IMPLANT
KIT BASIN OR (CUSTOM PROCEDURE TRAY) ×3 IMPLANT
KIT ROOM TURNOVER OR (KITS) ×3 IMPLANT
NS IRRIG 1000ML POUR BTL (IV SOLUTION) ×3 IMPLANT
PACK CV ACCESS (CUSTOM PROCEDURE TRAY) ×3 IMPLANT
PAD ARMBOARD 7.5X6 YLW CONV (MISCELLANEOUS) ×6 IMPLANT
SPONGE GAUZE 4X4 12PLY STER LF (GAUZE/BANDAGES/DRESSINGS) ×2 IMPLANT
STRIP CLOSURE SKIN 1/2X4 (GAUZE/BANDAGES/DRESSINGS) ×2 IMPLANT
SUT ETHILON 3 0 PS 1 (SUTURE) IMPLANT
SUT PROLENE 6 0 BV (SUTURE) IMPLANT
SUT SILK 0 TIES 10X30 (SUTURE) ×3 IMPLANT
SUT VIC AB 3-0 SH 27 (SUTURE) ×3
SUT VIC AB 3-0 SH 27X BRD (SUTURE) ×1 IMPLANT
SUT VICRYL 4-0 PS2 18IN ABS (SUTURE) IMPLANT
SWAB COLLECTION DEVICE MRSA (MISCELLANEOUS) IMPLANT
TAPE CLOTH SURG 4X10 WHT LF (GAUZE/BANDAGES/DRESSINGS) ×2 IMPLANT
TUBE ANAEROBIC SPECIMEN COL (MISCELLANEOUS) IMPLANT
UNDERPAD 30X30 (UNDERPADS AND DIAPERS) ×3 IMPLANT
WATER STERILE IRR 1000ML POUR (IV SOLUTION) ×3 IMPLANT

## 2016-07-17 NOTE — Anesthesia Preprocedure Evaluation (Signed)
Anesthesia Evaluation  Patient identified by MRN, date of birth, ID band Patient awake    Reviewed: Allergy & Precautions, NPO status , Patient's Chart, lab work & pertinent test results  Airway Mallampati: III  TM Distance: >3 FB Neck ROM: Full    Dental   Pulmonary asthma , sleep apnea , COPD,    breath sounds clear to auscultation       Cardiovascular hypertension, Pt. on medications and Pt. on home beta blockers + CAD and + Peripheral Vascular Disease   Rhythm:Regular Rate:Normal     Neuro/Psych PSYCHIATRIC DISORDERS Depression Bipolar Disorder negative neurological ROS     GI/Hepatic negative GI ROS, Neg liver ROS,   Endo/Other  diabetes, Type 2Hypothyroidism   Renal/GU ESRF and DialysisRenal disease     Musculoskeletal  (+) Arthritis ,   Abdominal   Peds  Hematology  (+) anemia ,   Anesthesia Other Findings   Reproductive/Obstetrics                             Anesthesia Physical  Anesthesia Plan  ASA: III  Anesthesia Plan: MAC   Post-op Pain Management:    Induction: Intravenous  Airway Management Planned: Simple Face Mask  Additional Equipment:   Intra-op Plan:   Post-operative Plan:   Informed Consent: I have reviewed the patients History and Physical, chart, labs and discussed the procedure including the risks, benefits and alternatives for the proposed anesthesia with the patient or authorized representative who has indicated his/her understanding and acceptance.   Dental advisory given  Plan Discussed with: CRNA  Anesthesia Plan Comments:         Anesthesia Quick Evaluation

## 2016-07-17 NOTE — Progress Notes (Signed)
IV team at bedside to assess HD cath site.

## 2016-07-17 NOTE — Progress Notes (Signed)
MD paged regarding pt tunneled HD cath bleeding. IV team held pressure and redressed site. MD requested radiology to be paged and pressure to site. Radiology PA was paged and came to bedside. PA ordered heparin gtt to be held for an hour. MD paged and made aware. RN will continue to monitor.

## 2016-07-17 NOTE — Anesthesia Postprocedure Evaluation (Signed)
Anesthesia Post Note  Patient: Noah Lewis  Procedure(s) Performed: Procedure(s) (LRB): LIGATION OF BRACHIOCEPHALIC ARTERIOVENOUS  FISTULA  Left arm (Left)  Patient location during evaluation: PACU Anesthesia Type: MAC Level of consciousness: awake and alert Pain management: pain level controlled Vital Signs Assessment: post-procedure vital signs reviewed and stable Respiratory status: spontaneous breathing, nonlabored ventilation, respiratory function stable and patient connected to nasal cannula oxygen Cardiovascular status: stable and blood pressure returned to baseline Anesthetic complications: no    Last Vitals:  Vitals:   07/17/16 1732 07/17/16 1800  BP: (!) 153/102 (!) 128/97  Pulse: 76 77  Resp:    Temp:      Last Pain:  Vitals:   07/17/16 1725  TempSrc:   PainSc: 4                  Tiajuana Amass

## 2016-07-17 NOTE — Progress Notes (Signed)
  Called again by nurse regarding bleeding at HD catheter site (right groin)  On exam, current dressing was completely saturated.  Upon removal of dressing, the bleeding appeared to be coming from the underside of the catheter.  I held constant pressure x 15 Minutes, then watched for 3 minutes. No further bleeding/oozing seen.  Clean dressing placed.  The site could bleed again. This is secondary to patient being on Eliquis (which he confirmed).  Could also be secondary to hypertension - 200/100 when I was in the room.  There is no reversal agent for Eliquis and oozing may continue until the effects of this medication has worn off.  Would continue to hold heparin drip for now until there has been no signs of bleeding for at least 3 hours.  Hgb 11.6 at 8 am today. Recommend recheck Hgb again tomorrow morning (or sooner if bleeding continues or worsens)  Recommend SBP <150  Rhylei Mcquaig S Katryn Plummer PA-C 07/17/2016 10:44 AM

## 2016-07-17 NOTE — Progress Notes (Signed)
  Called by nurse to evaluate catheter site.  Reported it had been bleeding.   IV team evaluated, held pressure, and replaced dressing.  On my evaluation there was no active bleeding, however patient is on a heparin drip.  Patient also has Eliquis in medication list. Unsure when this was last taken but patient reports he did take this medication prior to admission.  Will hold heparin drip x 1 hour, then restart if no bleeding.  Audel Coakley S Ellisha Bankson PA-C 07/17/2016 7:43 AM

## 2016-07-17 NOTE — Progress Notes (Signed)
RN noted pt femoral HD cath site was bleeding. Dried blood between legs and groin area noted. Dressing saturated and hanging off. Site not actively bleeding at this time. Pressure dressing applied. RN called HD to come and assess site but stated they can come in a hour. HD charge nurse suggested RN to call IV team.  IV team paged. Will continue to monitor and pass on to Day shift RN.

## 2016-07-17 NOTE — Op Note (Signed)
    OPERATIVE REPORT  DATE OF SURGERY: 07/17/2016  PATIENT: Noah Lewis, 65 y.o. 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  PHYSICIAN ASSISTANT: Nurse  ANESTHESIA:  Local with sedation  EBL: Minimal ml  Total I/O In: 255.2 [I.V.:255.2] Out: 5 [Blood:5]  BLOOD ADMINISTERED: None  DRAINS: None  SPECIMEN: None  COUNTS CORRECT:  YES  PLAN OF CARE: PACU   PATIENT DISPOSITION:  PACU - hemodynamically stable  PROCEDURE DETAILS: The patient was taken to the operative placed supine position where the area of the left arm prepped draped in usual sterile fashion. Using local anesthesia the prior antecubital incision was opened and the cephalic vein fistula was identified and was mobilized. The cephalic vein was mobilized for several centimeters out from the brachial artery anastomosis. The vein was doubly ligated with 2-0 silk sutures. The patient continued to maintain an excellent Doppler signal at his wrist which was unchanged from preoperative. There is no longer any flow through the fistula. The wound was irrigated with saline and hemostasis was obtained with electrocautery. The wound was closed with 3-0 Vicryl in the subcutaneous subcuticular tissue. Sterile dressing was applied and the patient was transferred to the recovery room in stable condition   Rosetta Posner, M.D., [facility] 07/17/2016 5:04 PM

## 2016-07-17 NOTE — Progress Notes (Signed)
Patient arrived to unit by bed.  Reviewed treatment plan and this RN agrees with plan.  Report received from bedside RN, Joycelyn Schmid.  Consent verified.  Patient A & O X 3.   Lung sounds diminished to ausculation in all fields. Generalized edema. Cardiac:  Afib.  Removed caps and cleansed R thigh catheter with chlorhedxidine.  Aspirated ports of heparin and flushed them with saline per protocol.  Connected and secured lines, initiated treatment at 1732.  UF Goal of 4543mL and net fluid removal 4L.  Will continue to monitor.

## 2016-07-17 NOTE — Progress Notes (Signed)
ANTICOAGULATION CONSULT NOTE - Initial Consult  Pharmacy Consult for Heparin Indication: atrial fibrillation  Allergies  Allergen Reactions  . Penicillins Other (See Comments)    Reaction:  Unknown     Patient Measurements: Height: 5\' 7"  (170.2 cm) Weight: 186 lb 15.2 oz (84.8 kg) IBW/kg (Calculated) : 66.1 Heparin Dosing Weight: 85.1  Vital Signs: Temp: 97.5 F (36.4 C) (10/25 1140) Temp Source: Oral (10/25 1140) BP: 136/105 (10/25 1324) Pulse Rate: 74 (10/25 1324)  Labs:  Recent Labs  07/16/16 1417 07/17/16 0806 07/17/16 0815  HGB 11.9* 11.6*  --   HCT 37.3* 35.6*  --   PLT 205 189  --   APTT  --  38*  --   LABPROT  --   --  15.3*  INR  --   --  1.20  HEPARINUNFRC  --   --  0.16*  CREATININE 17.91*  --  11.27*    Estimated Creatinine Clearance: 6.8 mL/min (by C-G formula based on SCr of 11.27 mg/dL (H)).   Medical History: Past Medical History:  Diagnosis Date  . Arthritis   . Asthma   . Cancer ([place])   . COPD (chronic obstructive pulmonary disease) ([place])   . Coronary artery disease   . Diabetes mellitus without complication ([place])   . Hypertension   . Renal disorder    Medications:  Prescriptions Prior to Admission  Medication Sig Dispense Refill Last Dose  . acetaminophen (TYLENOL) 325 MG tablet Take 650 mg by mouth every 6 (six) hours as needed for mild pain, fever or headache.   unk at [employer]  . amiodarone (PACERONE) 400 MG tablet Take 1 tablet (400 mg total) by mouth daily. 30 tablet 0 07/16/2016 at Unknown time  . apixaban (ELIQUIS) 5 MG TABS tablet Take 5 mg by mouth 2 (two) times daily.   Past Week at 2100  . aspirin 81 MG chewable tablet Chew 1 tablet (81 mg total) by mouth daily. 30 tablet 11 07/16/2016 at 0900  . b complex-vitamin c-folic acid (NEPHRO-VITE) 0.8 MG TABS tablet Take 1 tablet by mouth 2 (two) times daily.   07/16/2016 at Unknown time  . Capsaicin (CAPSAGEL) 0.025 % GEL Apply 1 application topically 2 (two) times daily as needed  (for lower back pain).   unk at [employer]  . doxycycline (VIBRAMYCIN) 100 MG capsule Take 100 mg by mouth 2 (two) times daily.   Past Month at Unknown time  . ipratropium-albuterol (DUONEB) 0.5-2.5 (3) MG/3ML SOLN Take 3 mLs by nebulization every 6 (six) hours as needed (for wheezing/shortness of breath).   07/15/2016 at unk  . lamoTRIgine (LAMICTAL) 25 MG tablet Take 50 mg by mouth daily.   07/16/2016 at Unknown time  . levothyroxine (SYNTHROID, LEVOTHROID) 75 MCG tablet Take 75 mcg by mouth daily before breakfast.    07/16/2016 at Unknown time  . loratadine (CLARITIN) 10 MG tablet Take 1 tablet (10 mg total) by mouth daily. 30 tablet 11 07/16/2016 at Unknown time  . metoprolol tartrate (LOPRESSOR) 25 MG tablet Take 1 tablet (25 mg total) by mouth 2 (two) times daily.   07/16/2016 at 0900  . mirtazapine (REMERON) 15 MG tablet Take 15 mg by mouth at bedtime.   07/15/2016 at Unknown time  . polyethylene glycol (MIRALAX / GLYCOLAX) packet Take 17 g by mouth daily as needed for mild constipation.    unk at unk  . saccharomyces boulardii (FLORASTOR) 250 MG capsule Take 250 mg by mouth 2 (two) times daily.  Past Week at Unknown time  . sevelamer (RENAGEL) 800 MG tablet Take 2,400 mg by mouth 3 (three) times daily with meals.   07/16/2016 at Unknown time  . Travoprost, BAK Free, (TRAVATAN) 0.004 % SOLN ophthalmic solution Place 1 drop into both eyes at bedtime.   07/15/2016 at Unknown time    Assessment: Noah Lewis is an 65 y.o. male who is ESRD and has been sent to the ED secondary to LUE pain and a concern for possible infection. On Eliquis PTA for Afib. Transitioned to heparin for now. Hgb 11.6, plts wnl. Has some bleeding from the HD cath so provider wanted heparin held for one hour but primary continuing to hold for now for OR visit. HL was low this am at 0.16 and aPTT was normal at 38 but labs drawn about 30 minutes after heparin was put on hold so not accurate. Should consider different oral agent  since Eliquis not approved in ESRD patients.  Goal of Therapy:  Heparin level 0.3-0.7 units/ml Monitor platelets by anticoagulation protocol: Yes   Plan:  Continue to hold heparin gtt Monitor daily HL, CBC, s/s of bleed F/U anticoag restart after [place], PharmD, BCPS Clinical Pharmacist Pager [phone] 07/17/2016 2:56 PM

## 2016-07-17 NOTE — Progress Notes (Signed)
Pt femoral HD cath saturated on assessment. Radiology called and came to bedside. PA held pressure and changed dressing. RN will continue to monitor.

## 2016-07-17 NOTE — H&P (View-Only) (Signed)
Consult Note  Patient name: Noah Lewis MRN: [id number] DOB: June 11, 1951 Sex: male  Consulting Physician:  ER  Reason for Consult:  Chief Complaint  Patient presents with  . Vascular Access Problem    HISTORY OF PRESENT ILLNESS: 65 yo male seen in the office recently by Dr Bridgett Larsson who is on the OR schedule tomorrow for ligation of his left BCF.  He came to the ED today secondary to progressive dyspnea.  Last HD was 3 days ago.  He is c/o pain at his fistula site.  He does not have a catheter.  He is frequently non-compliant with HD.  Past Medical History:  Diagnosis Date  . Arthritis   . Asthma   . Cancer ([place])   . COPD (chronic obstructive pulmonary disease) ([place])   . Coronary artery disease   . Diabetes mellitus without complication ([place])   . Hypertension   . Renal disorder     Past Surgical History:  Procedure Laterality Date  . INSERTION OF DIALYSIS CATHETER    . INSERTION OF DIALYSIS CATHETER N/A 05/29/2015   Procedure: INSERTION OF DIALYSIS CATHETER;  Surgeon: Angelia Mould, MD;  Location: [facility];  Service: Vascular;  Laterality: N/A;  . NO PAST SURGERIES    . PERIPHERAL VASCULAR CATHETERIZATION N/A 10/25/2015   Procedure: Dialysis/Perma Catheter Insertion;  Surgeon: Katha Cabal, MD;  Location: [facility];  Service: Cardiovascular;  Laterality: N/A;  . PERIPHERAL VASCULAR CATHETERIZATION Left 02/22/2016   Procedure: A/V Shuntogram/Fistulagram;  Surgeon: Algernon Huxley, MD;  Location: [facility];  Service: Cardiovascular;  Laterality: Left;  . PERIPHERAL VASCULAR CATHETERIZATION Right 03/19/2016   Procedure: Dialysis/Perma Catheter Insertion;  Surgeon: Katha Cabal, MD;  Location: [facility];  Service: Cardiovascular;  Laterality: Right;  . PERIPHERAL VASCULAR CATHETERIZATION N/A 04/22/2016   Procedure: Dialysis/Perma Catheter Removal;  Surgeon: Algernon Huxley, MD;  Location: [facility];  Service: Cardiovascular;   Laterality: N/A;    Social History   Social History  . Marital status: Legally Separated    Spouse name: N/A  . Number of children: N/A  . Years of education: N/A   Occupational History  . Not on file.   Social History Main Topics  . Smoking status: Never Smoker  . Smokeless tobacco: Never Used  . Alcohol use No  . Drug use:     Types: Marijuana     Comment: Occasionally  . Sexual activity: Not on file   Other Topics Concern  . Not on file   Social History Narrative  . No narrative on file    Family History  Problem Relation Age of Onset  . Hypertension Other   . Diabetes Other     Allergies as of 07/16/2016 - Review Complete 07/16/2016  Allergen Reaction Noted  . Penicillins Other (See Comments) 10/18/2012    No current facility-administered medications on file prior to encounter.    Current Outpatient Prescriptions on File Prior to Encounter  Medication Sig Dispense Refill  . acetaminophen (TYLENOL) 325 MG tablet Take 650 mg by mouth every 6 (six) hours as needed for mild pain, fever or headache.    Marland Kitchen amiodarone (PACERONE) 400 MG tablet Take 1 tablet (400 mg total) by mouth daily. 30 tablet 0  . aspirin 81 MG chewable tablet Chew 1 tablet (81 mg total) by mouth daily. 30 tablet 11  . b complex-vitamin c-folic acid (NEPHRO-VITE) 0.8 MG TABS tablet  Take 1 tablet by mouth 2 (two) times daily.    . Capsaicin (CAPSAGEL) 0.025 % GEL Apply 1 application topically 2 (two) times daily as needed (for lower back pain).    Marland Kitchen ipratropium-albuterol (DUONEB) 0.5-2.5 (3) MG/3ML SOLN Take 3 mLs by nebulization every 6 (six) hours as needed (for wheezing/shortness of breath).    . lamoTRIgine (LAMICTAL) 25 MG tablet Take 50 mg by mouth daily.    Marland Kitchen levothyroxine (SYNTHROID, LEVOTHROID) 75 MCG tablet Take 75 mcg by mouth daily before breakfast.     . loratadine (CLARITIN) 10 MG tablet Take 1 tablet (10 mg total) by mouth daily. 30 tablet 11  . metoprolol tartrate (LOPRESSOR) 25 MG  tablet Take 1 tablet (25 mg total) by mouth 2 (two) times daily.    . mirtazapine (REMERON) 15 MG tablet Take 15 mg by mouth at bedtime.    . polyethylene glycol (MIRALAX / GLYCOLAX) packet Take 17 g by mouth daily as needed for mild constipation.     . sevelamer (RENAGEL) 800 MG tablet Take 2,400 mg by mouth 3 (three) times daily with meals.    . Travoprost, BAK Free, (TRAVATAN) 0.004 % SOLN ophthalmic solution Place 1 drop into both eyes at bedtime.       REVIEW OF SYSTEMS: Unable to obtain due to mental status  PHYSICAL EXAMINATION: General: The patient appears their stated age.  Vital signs are BP (!) 129/92   Pulse 84   Temp 98 F (36.7 C)   Resp 14   Ht 5\' 7"  (1.702 m)   Wt 200 lb (90.7 kg)   SpO2 97%   BMI 31.32 kg/m  Pulmonary: Respirations are mildly labored HEENT:  No gross abnormalities Abdomen: Soft and non-tender  Musculoskeletal: There are no major deformities.   Skin: dry eschars over several areas of his left AVF Psychiatric: The patient has normal affect. Cardiovascular: There is a regular rate and rhythm without significant murmur appreciated.  Diagnostic Studies: I have reviewed his previous fistulograms.  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  Keep NPO after midnight.  I discussed with IR , and they will place a permacath tonight.  His medication list says he is on Eliquis, I will need to verify he is not taking this in order to proceed with surgery tomorrow     V. Leia Alf, M.D. [facility] Office: [phone] Pager:  [phone]
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
## 2016-07-17 NOTE — Transfer of Care (Signed)
Immediate Anesthesia Transfer of Care Note  Patient: Noah Lewis  Procedure(s) Performed: Procedure(s): LIGATION OF BRACHIOCEPHALIC ARTERIOVENOUS  FISTULA  Left arm (Left)  Patient Location: PACU  Anesthesia Type:MAC  Level of Consciousness: awake and patient cooperative  Airway & Oxygen Therapy: Patient Spontanous Breathing and Patient connected to nasal cannula oxygen  Post-op Assessment: Report given to RN, Post -op Vital signs reviewed and stable and Patient moving all extremities  Post vital signs: Reviewed and stable  Last Vitals:  Vitals:   07/17/16 1323 07/17/16 1324  BP: (!) 149/122 (!) 136/105  Pulse: 75 74  Resp: 12 (!) 0  Temp:      Last Pain:  Vitals:   07/17/16 1140  TempSrc: Oral  PainSc:       Patients Stated Pain Goal: 1 ([id number])  Complications: No apparent anesthesia complications

## 2016-07-17 NOTE — Interval H&P Note (Signed)
History and Physical Interval Note:  07/17/2016 2:00 PM  Noah Lewis  has presented today for surgery, with the diagnosis of End Stage Renal Disease N18.6  The various methods of treatment have been discussed with the patient and family. After consideration of risks, benefits and other options for treatment, the patient has consented to  Procedure(s): LIGATION OF BRACHIOCEPHALIC ARTERIOVENOUS  FISTULA (Left) as a surgical intervention .  The patient's history has been reviewed, patient examined, no change in status, stable for surgery.  I have reviewed the patient's chart and labs.  Questions were answered to the patient's satisfaction.     Curt Jews

## 2016-07-17 NOTE — Progress Notes (Signed)
Subjective: Interval History: has no complaint.  Objective: Vital signs in last 24 hours: Temp:  [97.4 F (36.3 C)-98 F (36.7 C)] 98 F (36.7 C) (10/25 0731) Pulse Rate:  [63-96] 78 (10/25 0731) Resp:  [14-26] 23 (10/25 0731) BP: (81-185)/(54-147) 155/110 (10/25 0731) SpO2:  [82 %-100 %] 96 % (10/25 0731) Weight:  [84.8 kg (186 lb 15.2 oz)-90.8 kg (200 lb 2 oz)] 84.8 kg (186 lb 15.2 oz) (10/24 2324) Weight change:   Intake/Output from previous day: 10/24 0701 - 10/25 0700 In: 12 [I.V.:12] Out: 3500  Intake/Output this shift: No intake/output data recorded.  General appearance: obeys commands but not verbal, moves all extrem Resp: diminished breath sounds bilaterally and rales bibasilar Cardio: irregularly irregular rhythm and systolic murmur: holosystolic 2/6, blowing at apex GI: obese, pos bs, liver down 7 cm Pelvic: error  Extrem erosions over AVF LUA in 2 areas, R fem HD cath.  Lab Results:  Recent Labs  07/16/16 1417  WBC 8.2  HGB 11.9*  HCT 37.3*  PLT 205   BMET:  Recent Labs  07/16/16 1417  NA 138  K 5.7*  CL 96*  CO2 22  GLUCOSE 110*  BUN 82*  CREATININE 17.91*  CALCIUM 8.3*   No results for input(s): PTH in the last 72 hours. Iron Studies: No results for input(s): IRON, TIBC, TRANSFERRIN, FERRITIN in the last 72 hours.  Studies/Results: Dg Chest Port 1 View  Result Date: 07/17/2016 CLINICAL DATA:  Pulmonary edema, history COPD, diabetes mellitus, asthma, coronary artery disease, end-stage renal disease on dialysis EXAM: PORTABLE CHEST 1 VIEW COMPARISON:  Portable exam 0450 hours compared to 07/16/2016 FINDINGS: Minimal enlargement of cardiac silhouette. Mediastinal contours and pulmonary vascularity normal. Lungs now clear. No pleural effusion or pneumothorax. No significant osseous findings. IMPRESSION: No acute abnormalities. Pulmonary edema seen on previous exam improved. Electronically Signed   By: Lavonia Dana M.D.   On: 07/17/2016 07:39   Dg  Chest Portable 1 View  Result Date: 07/16/2016 CLINICAL DATA:  Hypoxia EXAM: PORTABLE CHEST 1 VIEW COMPARISON:  06/13/2016 FINDINGS: Diffuse interstitial opacity with occasional Kerley lines. No pleural effusion. Chronic cardiopericardial enlargement. Left brachiocephalic stent again noted. IMPRESSION: Mild pulmonary edema. Electronically Signed   By: Monte Fantasia M.D.   On: 07/16/2016 15:48    I have reviewed the patient's current medications.  Assessment/Plan: 1 ESRD vol xs, will do HD again to lower 2 HTN eval after HD cont Labetolol 3 Nonadherence 4 AFib AVOID Eliquis as is not approved in ESRD 5 Erosions on aVF to get tied off 6 Access  Will need to get VVS input and probable leg AVG vs intervention on L 7 Obesity 8 Anemia esa 9 HPTH vit D  P HD, eval for new access, hep for now, ligation avf   LOS: 1 day   Christphor Groft L 07/17/2016,7:57 AM

## 2016-07-17 NOTE — NC FL2 (Signed)
[place] MEDICAID FL2 LEVEL OF CARE SCREENING TOOL     IDENTIFICATION  Patient Name: Noah Lewis Birthdate: 08/31/1951 Sex: male Admission Date (Current Location): 07/16/2016  [facility] and [place] Number:  Herbalist and Address:  [facility], [place] [address] [place], [place] [address]      Provider Number: [id number]  Attending Physician Name and Address:  Nita Sells, MD  Relative Name and Phone Number:       Current Level of Care: Hospital Recommended Level of Care: [place] Prior Approval Number:    Date Approved/Denied:   PASRR Number: [id number]  Discharge Plan: SNF    Current Diagnoses: Patient Active Problem List   Diagnosis Date Noted  . Acute kidney injury superimposed on chronic kidney disease ([place]) 07/16/2016  . ESRD (end stage renal disease) ([place]) 06/13/2016  . 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 Pulmonary edema 05/27/2016  . Back pain   . Bacteremia   . Chronic a-fib ([place]) 12/04/2015  . Hypertensive urgency 09/20/2015  . 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 Anemia of chronic kidney failure   . Left arm swelling   . End-stage renal disease needing dialysis ([place]) 05/29/2015  . 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 COPD, moderate ([place]) 05/24/2015  . OSA (obstructive sleep apnea) 05/24/2015  . Type II diabetes mellitus with end-stage renal disease ([place]) 05/24/2015  . ESRD on hemodialysis ([place]) 05/13/2015  . Volume overload 04/28/2015  . Bipolar depression ([place]) 04/28/2015  . Benign hypertension with end-stage renal disease ([place]) 04/28/2015  . Chronic back pain 04/28/2015  . Facial swelling 04/28/2015  . Left upper extremity swelling     Orientation RESPIRATION BLADDER Height & Weight     Self, Time, Situation, Place  O2 (2L [place]) Incontinent, External catheter Weight: 186 lb 15.2 oz (84.8  kg) Height:  5\' 7"  (170.2 cm)  BEHAVIORAL SYMPTOMS/MOOD NEUROLOGICAL BOWEL NUTRITION STATUS      Continent Diet  AMBULATORY STATUS COMMUNICATION OF NEEDS Skin   Limited Assist Verbally Normal                       Personal Care Assistance Level of Assistance  Bathing, Dressing Bathing Assistance: Limited assistance   Dressing Assistance: Limited assistance     Functional Limitations Info             SPECIAL CARE FACTORS FREQUENCY  PT (By licensed PT), OT (By licensed OT)     PT Frequency: 5/wk OT Frequency: 5/wk            Contractures      Additional Factors Info  Code Status, Allergies, Isolation Precautions Code Status Info: FULL Allergies Info: Penicillins     Isolation Precautions Info: MRSA     Current Medications (07/17/2016):  This is the current hospital active medication list Current Facility-Administered Medications  Medication Dose Route Frequency Provider Last Rate Last Dose  . 0.9 %  sodium chloride infusion  100 mL Intravenous PRN Mauricia Area, MD      . 0.9 %  sodium chloride infusion  100 mL Intravenous PRN Mauricia Area, MD      . acetaminophen (TYLENOL) tablet 650 mg  650 mg Oral Q6H PRN Doreatha Lew, MD   650 mg at 07/16/16 2115  . alteplase (CATHFLO ACTIVASE) injection 2 mg  2 mg Intracatheter Once PRN Mauricia Area, MD      .  amiodarone (PACERONE) tablet 400 mg  400 mg Oral Daily Doreatha Lew, MD   400 mg at 07/17/16 1042  . aspirin chewable tablet 81 mg  81 mg Oral Daily Doreatha Lew, MD   81 mg at 07/17/16 1043  . [START ON 07/18/2016] calcitRIOL (ROCALTROL) capsule 0.5 mcg  0.5 mcg Oral Nat Math, MD      . calcium acetate (PHOSLO) capsule 2,001 mg  2,001 mg Oral TID WC Mauricia Area, MD      . capsaicin (ZOSTRIX) Q000111Q % cream 1 application  1 application Topical BID PRN Doreatha Lew, MD      . Derrill Memo ON 07/23/2016] Darbepoetin Alfa (ARANESP) injection 40 mcg  40 mcg Intravenous Q Tue-HD  Mauricia Area, MD   40 mcg at 07/16/16 2252  . heparin injection 1,000 Units  1,000 Units Dialysis PRN Mauricia Area, MD      . heparin injection 3,400 Units  40 Units/kg Dialysis Once in dialysis Mauricia Area, MD      . hydrALAZINE (APRESOLINE) injection 2 mg  2 mg Intravenous Q8H PRN Doreatha Lew, MD      . HYDROcodone-acetaminophen (NORCO/VICODIN) 5-325 MG per tablet 1-2 tablet  1-2 tablet Oral Q4H PRN Doreatha Lew, MD   2 tablet at 07/16/16 2252  . ipratropium-albuterol (DUONEB) 0.5-2.5 (3) MG/3ML nebulizer solution 3 mL  3 mL Nebulization Q6H PRN Doreatha Lew, MD      . labetalol (NORMODYNE) tablet 200 mg  200 mg Oral BID Mauricia Area, MD   200 mg at 07/17/16 1043  . labetalol (NORMODYNE,TRANDATE) injection 5 mg  5 mg Intravenous Q2H PRN Gardiner Barefoot, NP   5 mg at 07/17/16 [id number]  . lamoTRIgine (LAMICTAL) tablet 50 mg  50 mg Oral Daily Doreatha Lew, MD   50 mg at 07/17/16 1042  . levothyroxine (SYNTHROID, LEVOTHROID) tablet 75 mcg  75 mcg Oral QAC breakfast Doreatha Lew, MD   75 mcg at 07/17/16 0800  . lidocaine (PF) (XYLOCAINE) 1 % injection 5 mL  5 mL Intradermal PRN Mauricia Area, MD      . lidocaine-prilocaine (EMLA) cream 1 application  1 application Topical PRN Mauricia Area, MD      . loratadine (CLARITIN) tablet 10 mg  10 mg Oral Daily Doreatha Lew, MD   10 mg at 07/17/16 1043  . mirtazapine (REMERON SOL-TAB) disintegrating tablet 15 mg  15 mg Oral QHS Mauricia Area, MD      . multivitamin (RENA-VIT) tablet 1 tablet  1 tablet Oral QHS Doreatha Lew, MD      . ondansetron [facility]) tablet 4 mg  4 mg Oral Q6H PRN Doreatha Lew, MD       Or  . ondansetron [facility]) injection 4 mg  4 mg Intravenous Q6H PRN Doreatha Lew, MD      . pentafluoroprop-tetrafluoroeth (GEBAUERS) aerosol 1 application  1 application Topical PRN Mauricia Area, MD      . polyethylene glycol (MIRALAX / GLYCOLAX) packet 17 g  17 g Oral Daily PRN  Doreatha Lew, MD      . saccharomyces boulardii (FLORASTOR) capsule 250 mg  250 mg Oral BID Doreatha Lew, MD   250 mg at 07/17/16 1042  . sevelamer carbonate (RENVELA) powder PACK 4.8 g  4.8 g Oral TID WC Mauricia Area, MD         Discharge Medications: Please see discharge summary for a list of discharge medications.  Relevant Imaging Results:  Relevant Lab Results:   Additional Information SS#: [id number]  Jorge Ny, LCSW

## 2016-07-17 NOTE — Procedures (Signed)
I was present at this session.  I have reviewed the session itself and made appropriate changes.  HD via fem cath.  Blood on dressing.  Anishka Bushard L 10/25/20175:44 PM

## 2016-07-17 NOTE — Progress Notes (Signed)
Report was given to short stay RN.

## 2016-07-18 ENCOUNTER — Encounter (HOSPITAL_COMMUNITY): Payer: Self-pay | Admitting: Vascular Surgery

## 2016-07-18 DIAGNOSIS — N179 Acute kidney failure, unspecified: Secondary | ICD-10-CM | POA: Diagnosis not present

## 2016-07-18 DIAGNOSIS — T82898A Other specified complication of vascular prosthetic devices, implants and grafts, initial encounter: Secondary | ICD-10-CM | POA: Diagnosis not present

## 2016-07-18 DIAGNOSIS — M79602 Pain in left arm: Secondary | ICD-10-CM | POA: Diagnosis not present

## 2016-07-18 DIAGNOSIS — N189 Chronic kidney disease, unspecified: Secondary | ICD-10-CM | POA: Diagnosis not present

## 2016-07-18 LAB — CBC WITH DIFFERENTIAL/PLATELET
BASOS ABS: 0 10*3/uL (ref 0.0–0.1)
BASOS PCT: 0 %
EOS PCT: 6 %
Eosinophils Absolute: 0.3 10*3/uL (ref 0.0–0.7)
HEMATOCRIT: 32.9 % — AB (ref 39.0–52.0)
Hemoglobin: 10.4 g/dL — ABNORMAL LOW (ref 13.0–17.0)
Lymphocytes Relative: 8 %
Lymphs Abs: 0.5 10*3/uL — ABNORMAL LOW (ref 0.7–4.0)
MCH: 27.4 pg (ref 26.0–34.0)
MCHC: 31.6 g/dL (ref 30.0–36.0)
MCV: 86.6 fL (ref 78.0–100.0)
MONO ABS: 0.7 10*3/uL (ref 0.1–1.0)
Monocytes Relative: 13 %
NEUTROS ABS: 4.2 10*3/uL (ref 1.7–7.7)
Neutrophils Relative %: 73 %
PLATELETS: 171 10*3/uL (ref 150–400)
RBC: 3.8 MIL/uL — AB (ref 4.22–5.81)
RDW: 17.2 % — AB (ref 11.5–15.5)
WBC: 5.7 10*3/uL (ref 4.0–10.5)

## 2016-07-18 LAB — HEPARIN LEVEL (UNFRACTIONATED)

## 2016-07-18 LAB — APTT: APTT: 29 s (ref 24–36)

## 2016-07-18 LAB — PROCALCITONIN: Procalcitonin: 0.75 ng/mL

## 2016-07-18 MED ORDER — HEPARIN SODIUM (PORCINE) 1000 UNIT/ML DIALYSIS: 1000.0000 [IU] | INTRAMUSCULAR | Status: DC | PRN

## 2016-07-18 MED ORDER — SODIUM CHLORIDE 0.9 % IV SOLN: 100.0000 mL | INTRAVENOUS | Status: DC | PRN

## 2016-07-18 MED ORDER — LIDOCAINE HCL (PF) 1 % IJ SOLN: 5.0000 mL | INTRAMUSCULAR | Status: DC | PRN

## 2016-07-18 MED ORDER — PENTAFLUOROPROP-TETRAFLUOROETH EX AERO: 1.0000 "application " | INHALATION_SPRAY | CUTANEOUS | Status: DC | PRN

## 2016-07-18 MED ORDER — ALTEPLASE 2 MG IJ SOLR: 2.0000 mg | Freq: Once | INTRAMUSCULAR | Status: DC | PRN

## 2016-07-18 MED ORDER — NEPRO/CARBSTEADY PO LIQD
237.0000 mL | Freq: Two times a day (BID) | ORAL | Status: DC
  Administered 2016-07-20 – 2016-07-21 (×2): 237 mL via ORAL

## 2016-07-18 MED ORDER — LIDOCAINE-PRILOCAINE 2.5-2.5 % EX CREA: 1.0000 "application " | TOPICAL_CREAM | CUTANEOUS | Status: DC | PRN

## 2016-07-18 MED ORDER — HEPARIN SODIUM (PORCINE) 1000 UNIT/ML DIALYSIS
100.0000 [IU]/kg | INTRAMUSCULAR | Status: DC | PRN
  Filled 2016-07-18: qty 8

## 2016-07-18 NOTE — Progress Notes (Signed)
HD cath site to right groin continues to ooze blood. Per HD RN, vascular MD instructed to continue to assess, but not to change dressing overnight d/t disruption in the clotting process causing continued bleeding. Will continue to assess.

## 2016-07-18 NOTE — Progress Notes (Signed)
ANTICOAGULATION CONSULT NOTE - Initial Consult  Pharmacy Consult for Heparin Indication: atrial fibrillation  Allergies  Allergen Reactions  . Penicillins Other (See Comments)    Reaction:  Unknown     Patient Measurements: Height: 5\' 7"  (170.2 cm) Weight: 170 lb 13.7 oz (77.5 kg) IBW/kg (Calculated) : 66.1 Heparin Dosing Weight: 85.1  Vital Signs: Temp: 98.7 F (37.1 C) (10/26 0740) Temp Source: Oral (10/26 0740) BP: 123/85 (10/26 0740) Pulse Rate: 94 (10/26 0740)  Labs:  Recent Labs  07/16/16 1417 07/17/16 0806 07/17/16 0815 07/18/16 0501  HGB 11.9* 11.6*  --  10.4*  HCT 37.3* 35.6*  --  32.9*  PLT 205 189  --  171  APTT  --  38*  --  29  LABPROT  --   --  15.3*  --   INR  --   --  1.20  --   HEPARINUNFRC  --   --  0.16* <0.10*  CREATININE 17.91*  --  11.27*  --     Estimated Creatinine Clearance: 6.1 mL/min (by C-G formula based on SCr of 11.27 mg/dL (H)).   Assessment: Noah Lewis is an 65 y.o. male who is ESRD and has been sent to the ED secondary to LUE pain and a concern for possible infection. On Eliquis PTA for Afib. Transitioned to heparin for now. Now s/p OR and anticoag remains on hold due to bleeding. Should consider different oral agent since Eliquis not approved in ESRD patients. Hgb dropped to 10. Still having some bleeding from the HD cath.  Goal of Therapy:  Heparin level 0.3-0.7 units/ml Monitor platelets by anticoagulation protocol: Yes   Plan:  Continue to hold heparin gtt Monitor daily HL, CBC, s/s of bleed F/U anticoag restart   Elenor Quinones, PharmD, BCPS Clinical Pharmacist Pager [phone] 07/18/2016 11:29 AM

## 2016-07-18 NOTE — Progress Notes (Signed)
HD cath site to right groin continues to ooze blood. Dressing reinforced, will continue to assess.
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
## 2016-07-18 NOTE — Progress Notes (Signed)
Subjective: Interval History: has no complaint, feels better.  Objective: Vital signs in last 24 hours: Temp:  [97.5 F (36.4 C)-98.7 F (37.1 C)] 98.7 F (37.1 C) (10/26 0740) Pulse Rate:  [62-94] 94 (10/26 0740) Resp:  [0-22] 16 (10/26 0740) BP: (75-154)/(42-122) 123/85 (10/26 0740) SpO2:  [92 %-100 %] 95 % (10/26 0740) Weight:  [77.5 kg (170 lb 13.7 oz)-78.7 kg (173 lb 8 oz)] 77.5 kg (170 lb 13.7 oz) (10/25 2130) Weight change: -12.019 kg (-26 lb 8 oz)  Intake/Output from previous day: 10/25 0701 - 10/26 0700 In: 255.2 [I.V.:255.2] Out: 1205 [Blood:5] Intake/Output this shift: No intake/output data recorded.  General appearance: cooperative, moderately obese and slowed mentation Resp: diminished breath sounds bilaterally and wheezes bibasilar Cardio: irregularly irregular rhythm and systolic murmur: holosystolic 2/6, blowing at apex GI: obese, pos bs, liver down 5 cm Extremities: edema 1+ and Fem HD cath  Lab Results:  Recent Labs  07/17/16 0806 07/18/16 0501  WBC 7.1 5.7  HGB 11.6* 10.4*  HCT 35.6* 32.9*  PLT 189 171   BMET:  Recent Labs  07/16/16 1417 07/17/16 0815  NA 138 136  K 5.7* 4.7  CL 96* 95*  CO2 22 26  GLUCOSE 110* 81  BUN 82* 37*  CREATININE 17.91* 11.27*  CALCIUM 8.3* 8.3*   No results for input(s): PTH in the last 72 hours. Iron Studies: No results for input(s): IRON, TIBC, TRANSFERRIN, FERRITIN in the last 72 hours.  Studies/Results: Ir Veno/ext/uni Right  Result Date: 07/17/2016 CLINICAL DATA:  End-stage renal disease on hemodialysis. Planned fistula ligation. Intermediate to long-term venous access needed for hemodialysis. History of multiple venous occlusions. EXAM: 1. RIGHT UPPER EXTREMITY VENOGRAM 2. TUNNELED HEMODIALYSIS CATHETER PLACEMENT WITH ULTRASOUND AND FLUOROSCOPIC GUIDANCE 3. ULTRASOUND GUIDANCE FOR VASCULAR ACCESS TECHNIQUE: The procedure, risks, benefits, and alternatives were explained to the patient. Questions regarding  the procedure were encouraged and answered. The patient understands and consents to the procedure. As antibiotic prophylaxis, vancomycin 1 g was ordered pre-procedure and administered intravenously within one hour of incision. Review of previous imaging revealed long segment occlusion of the left innominate vein. A patent right IJ vein could not be identified on survey ultrasound. The right external jugular vein was identified, which had been shown to give access to the SVC on the previous CT from 2016. Patency of the right external jugular vein was confirmed with ultrasound with image documentation. An appropriate skin site was determined. Region was prepped using maximum barrier technique including cap and mask, sterile gown, sterile gloves, large sterile sheet, and Chlorhexidine as cutaneous antisepsis. The region was infiltrated locally with 1% lidocaine. Under real-time ultrasound guidance, the right external jugular vein was accessed with a 21 gauge micropuncture needle; the needle tip within the vein was confirmed with ultrasound image documentation. Needle exchanged over the 018 guidewire for a 3 [place] microdilator, which allowed venography, demonstrating occlusion of the right UPJ vein centrally. There is collateral reconstitution of the right axillary and subclavian vein. The subclavian vein tapers and occludes centrally. Body wall collateral strain the right upper extremity. Because of the tapered appearance of the right subclavian vein, recanalization was attempted. Under real-time ultrasound guidance, the right subclavian vein was accessed with a 21 gauge micropuncture needle; the needle tip within the vein was confirmed with ultrasound image documentation. Needle exchanged over the 018 guidewire for a 3 [place] microdilator, which allowed venography, demonstrating occlusion of the right subclavian vein centrally. The micro dilator was exchanged for the transitional dilator,  which allowed advancement of  the [facility] wire. The tract was dilated to the site allow advancement of an angled 5 French angiographic Kumpe the catheter. Central made with the aid of an angled Glidewire stiff Glidewire to traverse the central subclavian occlusion and gain access to the SVC. During these attempts, the Kumpe the catheter was exchanged for a 4 French angled glide catheter. Ultimately, the included segments could not be traversed. This required femoral access to be used. Patency of the right common femoral vein was confirmed with ultrasound with image documentation. An appropriate skin site was determined. Region was prepped using maximum barrier technique including cap and mask, sterile gown, sterile gloves, large sterile sheet, and Chlorhexidine as cutaneous antisepsis. The region was infiltrated locally with 1% lidocaine. Under real-time ultrasound guidance, the right common femoral vein was accessed with a 21 gauge micropuncture needle; the needle tip within the vein was confirmed with ultrasound image documentation. Needle exchanged over the 018 guidewire for transitional dilator which allowed advancement of the [facility] wire into the IVC. A 5 French angiographic catheter was placed and intravascular length was measured. A Palindrome 44 cm hemodialysis catheter was tunneled from the right proximal anterior thigh approach to the right femoral dermatotomy site. The angiographic catheter was exchanged over an Amplatz wire for serial vascular dilators which allow placement of a peel-away sheath, through which the catheter was advanced under intermittent fluoroscopy, positioned with its tips at the inferior cavoatrial junction. Spot chest radiograph confirms good catheter position. No pneumothorax. Catheter was flushed and primed per protocol. Catheter secured externally with O Prolene sutures. The right femoral dermatotomy site was closed with Dermabond. COMPLICATIONS: COMPLICATIONS None immediate FLUOROSCOPY TIME:  9 minutes, 308  uGym2 DAP COMPARISON:  06/02/2015 and previous IMPRESSION: 1. Technically successful placement of tunneled right femoral hemodialysis catheter with ultrasound and fluoroscopic guidance. Ready for routine use. 2. 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 Re- cannulization attempts to gain access to the SVC were not successful. ACCESS: There is no upper extremity central venous access. Future catheters will require femoral, trans lumbar, or other alternative access approaches. Electronically Signed   By: Lucrezia Europe M.D.   On: 07/17/2016 11:16   Ir Fluoro Guide Cv Line Right  Result Date: 07/17/2016 CLINICAL DATA:  End-stage renal disease on hemodialysis. Planned fistula ligation. Intermediate to long-term venous access needed for hemodialysis. History of multiple venous occlusions. EXAM: 1. RIGHT UPPER EXTREMITY VENOGRAM 2. TUNNELED HEMODIALYSIS CATHETER PLACEMENT WITH ULTRASOUND AND FLUOROSCOPIC GUIDANCE 3. ULTRASOUND GUIDANCE FOR VASCULAR ACCESS TECHNIQUE: The procedure, risks, benefits, and alternatives were explained to the patient. Questions regarding the procedure were encouraged and answered. The patient understands and consents to the procedure. As antibiotic prophylaxis, vancomycin 1 g was ordered pre-procedure and administered intravenously within one hour of incision. Review of previous imaging revealed long segment occlusion of the left innominate vein. A patent right IJ vein could not be identified on survey ultrasound. The right external jugular vein was identified, which had been shown to give access to the SVC on the previous CT from 2016. Patency of the right external jugular vein was confirmed with ultrasound with image documentation. An appropriate skin site was determined. Region was prepped using maximum barrier technique including cap and mask, sterile gown, sterile gloves, large sterile sheet, and Chlorhexidine as cutaneous antisepsis. The region was infiltrated locally  with 1% lidocaine. Under real-time ultrasound guidance, the right external jugular vein was accessed with a 21 gauge micropuncture needle; the needle tip within  the vein was confirmed with ultrasound image documentation. Needle exchanged over the 018 guidewire for a 3 [place] microdilator, which allowed venography, demonstrating occlusion of the right UPJ vein centrally. There is collateral reconstitution of the right axillary and subclavian vein. The subclavian vein tapers and occludes centrally. Body wall collateral strain the right upper extremity. Because of the tapered appearance of the right subclavian vein, recanalization was attempted. Under real-time ultrasound guidance, the right subclavian vein was accessed with a 21 gauge micropuncture needle; the needle tip within the vein was confirmed with ultrasound image documentation. Needle exchanged over the 018 guidewire for a 3 [place] microdilator, which allowed venography, demonstrating occlusion of the right subclavian vein centrally. The micro dilator was exchanged for the transitional dilator, which allowed advancement of the [facility] wire. The tract was dilated to the site allow advancement of an angled 5 French angiographic Kumpe the catheter. Central made with the aid of an angled Glidewire stiff Glidewire to traverse the central subclavian occlusion and gain access to the SVC. During these attempts, the Kumpe the catheter was exchanged for a 4 French angled glide catheter. Ultimately, the included segments could not be traversed. This required femoral access to be used. Patency of the right common femoral vein was confirmed with ultrasound with image documentation. An appropriate skin site was determined. Region was prepped using maximum barrier technique including cap and mask, sterile gown, sterile gloves, large sterile sheet, and Chlorhexidine as cutaneous antisepsis. The region was infiltrated locally with 1% lidocaine. Under real-time ultrasound  guidance, the right common femoral vein was accessed with a 21 gauge micropuncture needle; the needle tip within the vein was confirmed with ultrasound image documentation. Needle exchanged over the 018 guidewire for transitional dilator which allowed advancement of the [facility] wire into the IVC. A 5 French angiographic catheter was placed and intravascular length was measured. A Palindrome 44 cm hemodialysis catheter was tunneled from the right proximal anterior thigh approach to the right femoral dermatotomy site. The angiographic catheter was exchanged over an Amplatz wire for serial vascular dilators which allow placement of a peel-away sheath, through which the catheter was advanced under intermittent fluoroscopy, positioned with its tips at the inferior cavoatrial junction. Spot chest radiograph confirms good catheter position. No pneumothorax. Catheter was flushed and primed per protocol. Catheter secured externally with O Prolene sutures. The right femoral dermatotomy site was closed with Dermabond. COMPLICATIONS: COMPLICATIONS None immediate FLUOROSCOPY TIME:  9 minutes, 308 uGym2 DAP COMPARISON:  06/02/2015 and previous IMPRESSION: 1. Technically successful placement of tunneled right femoral hemodialysis catheter with ultrasound and fluoroscopic guidance. Ready for routine use. 2. 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 Re- cannulization attempts to gain access to the SVC were not successful. ACCESS: There is no upper extremity central venous access. Future catheters will require femoral, trans lumbar, or other alternative access approaches. Electronically Signed   By: Lucrezia Europe M.D.   On: 07/17/2016 11:16   Ir US Guide Vasc Access Right  Result Date: 07/17/2016 CLINICAL DATA:  End-stage renal disease on hemodialysis. Planned fistula ligation. Intermediate to long-term venous access needed for hemodialysis. History of multiple venous occlusions. EXAM: 1. RIGHT UPPER EXTREMITY  VENOGRAM 2. TUNNELED HEMODIALYSIS CATHETER PLACEMENT WITH ULTRASOUND AND FLUOROSCOPIC GUIDANCE 3. ULTRASOUND GUIDANCE FOR VASCULAR ACCESS TECHNIQUE: The procedure, risks, benefits, and alternatives were explained to the patient. Questions regarding the procedure were encouraged and answered. The patient understands and consents to the procedure. As antibiotic prophylaxis, vancomycin 1 g was ordered pre-procedure and  administered intravenously within one hour of incision. Review of previous imaging revealed long segment occlusion of the left innominate vein. A patent right IJ vein could not be identified on survey ultrasound. The right external jugular vein was identified, which had been shown to give access to the SVC on the previous CT from 2016. Patency of the right external jugular vein was confirmed with ultrasound with image documentation. An appropriate skin site was determined. Region was prepped using maximum barrier technique including cap and mask, sterile gown, sterile gloves, large sterile sheet, and Chlorhexidine as cutaneous antisepsis. The region was infiltrated locally with 1% lidocaine. Under real-time ultrasound guidance, the right external jugular vein was accessed with a 21 gauge micropuncture needle; the needle tip within the vein was confirmed with ultrasound image documentation. Needle exchanged over the 018 guidewire for a 3 [place] microdilator, which allowed venography, demonstrating occlusion of the right UPJ vein centrally. There is collateral reconstitution of the right axillary and subclavian vein. The subclavian vein tapers and occludes centrally. Body wall collateral strain the right upper extremity. Because of the tapered appearance of the right subclavian vein, recanalization was attempted. Under real-time ultrasound guidance, the right subclavian vein was accessed with a 21 gauge micropuncture needle; the needle tip within the vein was confirmed with ultrasound image documentation.  Needle exchanged over the 018 guidewire for a 3 [place] microdilator, which allowed venography, demonstrating occlusion of the right subclavian vein centrally. The micro dilator was exchanged for the transitional dilator, which allowed advancement of the [facility] wire. The tract was dilated to the site allow advancement of an angled 5 French angiographic Kumpe the catheter. Central made with the aid of an angled Glidewire stiff Glidewire to traverse the central subclavian occlusion and gain access to the SVC. During these attempts, the Kumpe the catheter was exchanged for a 4 French angled glide catheter. Ultimately, the included segments could not be traversed. This required femoral access to be used. Patency of the right common femoral vein was confirmed with ultrasound with image documentation. An appropriate skin site was determined. Region was prepped using maximum barrier technique including cap and mask, sterile gown, sterile gloves, large sterile sheet, and Chlorhexidine as cutaneous antisepsis. The region was infiltrated locally with 1% lidocaine. Under real-time ultrasound guidance, the right common femoral vein was accessed with a 21 gauge micropuncture needle; the needle tip within the vein was confirmed with ultrasound image documentation. Needle exchanged over the 018 guidewire for transitional dilator which allowed advancement of the [facility] wire into the IVC. A 5 French angiographic catheter was placed and intravascular length was measured. A Palindrome 44 cm hemodialysis catheter was tunneled from the right proximal anterior thigh approach to the right femoral dermatotomy site. The angiographic catheter was exchanged over an Amplatz wire for serial vascular dilators which allow placement of a peel-away sheath, through which the catheter was advanced under intermittent fluoroscopy, positioned with its tips at the inferior cavoatrial junction. Spot chest radiograph confirms good catheter position. No  pneumothorax. Catheter was flushed and primed per protocol. Catheter secured externally with O Prolene sutures. The right femoral dermatotomy site was closed with Dermabond. COMPLICATIONS: COMPLICATIONS None immediate FLUOROSCOPY TIME:  9 minutes, 308 uGym2 DAP COMPARISON:  06/02/2015 and previous IMPRESSION: 1. Technically successful placement of tunneled right femoral hemodialysis catheter with ultrasound and fluoroscopic guidance. Ready for routine use. 2. 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 Re- cannulization attempts to gain access to the SVC were not successful. ACCESS: There is no  upper extremity central venous access. Future catheters will require femoral, trans lumbar, or other alternative access approaches. Electronically Signed   By: Lucrezia Europe M.D.   On: 07/17/2016 11:16   Ir US Guide Vasc Access Right  Result Date: 07/17/2016 CLINICAL DATA:  End-stage renal disease on hemodialysis. Planned fistula ligation. Intermediate to long-term venous access needed for hemodialysis. History of multiple venous occlusions. EXAM: 1. RIGHT UPPER EXTREMITY VENOGRAM 2. TUNNELED HEMODIALYSIS CATHETER PLACEMENT WITH ULTRASOUND AND FLUOROSCOPIC GUIDANCE 3. ULTRASOUND GUIDANCE FOR VASCULAR ACCESS TECHNIQUE: The procedure, risks, benefits, and alternatives were explained to the patient. Questions regarding the procedure were encouraged and answered. The patient understands and consents to the procedure. As antibiotic prophylaxis, vancomycin 1 g was ordered pre-procedure and administered intravenously within one hour of incision. Review of previous imaging revealed long segment occlusion of the left innominate vein. A patent right IJ vein could not be identified on survey ultrasound. The right external jugular vein was identified, which had been shown to give access to the SVC on the previous CT from 2016. Patency of the right external jugular vein was confirmed with ultrasound with image  documentation. An appropriate skin site was determined. Region was prepped using maximum barrier technique including cap and mask, sterile gown, sterile gloves, large sterile sheet, and Chlorhexidine as cutaneous antisepsis. The region was infiltrated locally with 1% lidocaine. Under real-time ultrasound guidance, the right external jugular vein was accessed with a 21 gauge micropuncture needle; the needle tip within the vein was confirmed with ultrasound image documentation. Needle exchanged over the 018 guidewire for a 3 [place] microdilator, which allowed venography, demonstrating occlusion of the right UPJ vein centrally. There is collateral reconstitution of the right axillary and subclavian vein. The subclavian vein tapers and occludes centrally. Body wall collateral strain the right upper extremity. Because of the tapered appearance of the right subclavian vein, recanalization was attempted. Under real-time ultrasound guidance, the right subclavian vein was accessed with a 21 gauge micropuncture needle; the needle tip within the vein was confirmed with ultrasound image documentation. Needle exchanged over the 018 guidewire for a 3 [place] microdilator, which allowed venography, demonstrating occlusion of the right subclavian vein centrally. The micro dilator was exchanged for the transitional dilator, which allowed advancement of the [facility] wire. The tract was dilated to the site allow advancement of an angled 5 French angiographic Kumpe the catheter. Central made with the aid of an angled Glidewire stiff Glidewire to traverse the central subclavian occlusion and gain access to the SVC. During these attempts, the Kumpe the catheter was exchanged for a 4 French angled glide catheter. Ultimately, the included segments could not be traversed. This required femoral access to be used. Patency of the right common femoral vein was confirmed with ultrasound with image documentation. An appropriate skin site was  determined. Region was prepped using maximum barrier technique including cap and mask, sterile gown, sterile gloves, large sterile sheet, and Chlorhexidine as cutaneous antisepsis. The region was infiltrated locally with 1% lidocaine. Under real-time ultrasound guidance, the right common femoral vein was accessed with a 21 gauge micropuncture needle; the needle tip within the vein was confirmed with ultrasound image documentation. Needle exchanged over the 018 guidewire for transitional dilator which allowed advancement of the [facility] wire into the IVC. A 5 French angiographic catheter was placed and intravascular length was measured. A Palindrome 44 cm hemodialysis catheter was tunneled from the right proximal anterior thigh approach to the right femoral dermatotomy site. The angiographic catheter was exchanged  over an Amplatz wire for serial vascular dilators which allow placement of a peel-away sheath, through which the catheter was advanced under intermittent fluoroscopy, positioned with its tips at the inferior cavoatrial junction. Spot chest radiograph confirms good catheter position. No pneumothorax. Catheter was flushed and primed per protocol. Catheter secured externally with O Prolene sutures. The right femoral dermatotomy site was closed with Dermabond. COMPLICATIONS: COMPLICATIONS None immediate FLUOROSCOPY TIME:  9 minutes, 308 uGym2 DAP COMPARISON:  06/02/2015 and previous IMPRESSION: 1. Technically successful placement of tunneled right femoral hemodialysis catheter with ultrasound and fluoroscopic guidance. Ready for routine use. 2. 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 Re- cannulization attempts to gain access to the SVC were not successful. ACCESS: There is no upper extremity central venous access. Future catheters will require femoral, trans lumbar, or other alternative access approaches. Electronically Signed   By: Lucrezia Europe M.D.   On: 07/17/2016 11:16   Dg Chest Port 1  View  Result Date: 07/17/2016 CLINICAL DATA:  Pulmonary edema, history COPD, diabetes mellitus, asthma, coronary artery disease, end-stage renal disease on dialysis EXAM: PORTABLE CHEST 1 VIEW COMPARISON:  Portable exam 0450 hours compared to 07/16/2016 FINDINGS: Minimal enlargement of cardiac silhouette. Mediastinal contours and pulmonary vascularity normal. Lungs now clear. No pleural effusion or pneumothorax. No significant osseous findings. IMPRESSION: No acute abnormalities. Pulmonary edema seen on previous exam improved. Electronically Signed   By: Lavonia Dana M.D.   On: 07/17/2016 07:39   Dg Chest Portable 1 View  Result Date: 07/16/2016 CLINICAL DATA:  Hypoxia EXAM: PORTABLE CHEST 1 VIEW COMPARISON:  06/13/2016 FINDINGS: Diffuse interstitial opacity with occasional Kerley lines. No pleural effusion. Chronic cardiopericardial enlargement. Left brachiocephalic stent again noted. IMPRESSION: Mild pulmonary edema. Electronically Signed   By: Monte Fantasia M.D.   On: 07/16/2016 15:48    I have reviewed the patient's current medications.  Assessment/Plan: 1 ESRD will get on schedule today.  Vol much better.   Need perm access. Will address with [facility] 2 NONadherence primary issue  3 Anemia esa, Fe 4 HPTH vit D 5 obesity 6 Access LUA avf tied off 7 Substance abuse P HD, esa, plan new access, counsel    LOS: 2 days   Jalan Fariss L 07/18/2016,8:17 AM

## 2016-07-19 DIAGNOSIS — N189 Chronic kidney disease, unspecified: Secondary | ICD-10-CM | POA: Diagnosis not present

## 2016-07-19 DIAGNOSIS — T82898A Other specified complication of vascular prosthetic devices, implants and grafts, initial encounter: Secondary | ICD-10-CM | POA: Diagnosis not present

## 2016-07-19 DIAGNOSIS — N179 Acute kidney failure, unspecified: Secondary | ICD-10-CM | POA: Diagnosis not present

## 2016-07-19 DIAGNOSIS — M79602 Pain in left arm: Secondary | ICD-10-CM | POA: Diagnosis not present

## 2016-07-19 LAB — HEPARIN LEVEL (UNFRACTIONATED)
Heparin Unfractionated: <0.1 IU/mL — ABNORMAL LOW (ref 0.30–0.70)
Heparin Unfractionated: 0.27 IU/mL — ABNORMAL LOW (ref 0.30–0.70)

## 2016-07-19 LAB — APTT: aPTT: 30 seconds (ref 24–36)

## 2016-07-19 LAB — CBC
HEMATOCRIT: 37 % — AB (ref 39.0–52.0)
HEMOGLOBIN: 11.6 g/dL — AB (ref 13.0–17.0)
MCH: 27.6 pg (ref 26.0–34.0)
MCHC: 31.4 g/dL (ref 30.0–36.0)
MCV: 87.9 fL (ref 78.0–100.0)
PLATELETS: 170 10*3/uL (ref 150–400)
RBC: 4.21 MIL/uL — AB (ref 4.22–5.81)
RDW: 17 % — ABNORMAL HIGH (ref 11.5–15.5)
WBC: 6.4 10*3/uL (ref 4.0–10.5)

## 2016-07-19 MED ORDER — HEPARIN (PORCINE) IN NACL 100-0.45 UNIT/ML-% IJ SOLN
1450.0000 [IU]/h | INTRAMUSCULAR | Status: DC
  Administered 2016-07-19: 1400 [IU]/h via INTRAVENOUS
  Administered 2016-07-20 (×2): 1500 [IU]/h via INTRAVENOUS
  Filled 2016-07-19 (×3): qty 250

## 2016-07-19 MED ORDER — APIXABAN 5 MG PO TABS: 5.0000 mg | ORAL_TABLET | Freq: Two times a day (BID) | ORAL | Status: DC

## 2016-07-19 MED ORDER — VANCOMYCIN HCL IN DEXTROSE 1-5 GM/200ML-% IV SOLN
1000.0000 mg | INTRAVENOUS | Status: AC
  Administered 2016-07-22: 1000 mg via INTRAVENOUS
  Filled 2016-07-19: qty 200

## 2016-07-19 NOTE — Progress Notes (Signed)
Subjective: Interval History: has no complaint, feels a lot better.  Objective: Vital signs in last 24 hours: Temp:  [97.7 F (36.5 C)-98.9 F (37.2 C)] 98.3 F (36.8 C) (10/27 0700) Pulse Rate:  [36-109] 99 (10/27 0700) Resp:  [15-18] 18 (10/27 0700) BP: (103-161)/(70-120) 125/111 (10/27 0700) SpO2:  [96 %-100 %] 100 % (10/27 0700) Weight:  [79.4 kg (175 lb 0.7 oz)-81.9 kg (180 lb 8.9 oz)] 80.9 kg (178 lb 5.6 oz) (10/27 0356) Weight change: 3.2 kg (7 lb 0.9 oz)  Intake/Output from previous day: 10/26 0701 - 10/27 0700 In: 960 [P.O.:960] Out: 1440  Intake/Output this shift: No intake/output data recorded.  General appearance: alert, cooperative, no distress and moderately obese Resp: diminished breath sounds bilaterally Cardio: irregularly irregular rhythm and systolic murmur: holosystolic 2/6, blowing at apex GI: obese, pos bs, liver down 5 cm Extremitiefem HD cath , AVF tied offfem HD cath , AVF tied off  Lab Results:  Recent Labs  07/18/16 0501 07/19/16 0209  WBC 5.7 6.4  HGB 10.4* 11.6*  HCT 32.9* 37.0*  PLT 171 170   BMET:  Recent Labs  07/16/16 1417 07/17/16 0815  NA 138 136  K 5.7* 4.7  CL 96* 95*  CO2 22 26  GLUCOSE 110* 81  BUN 82* 37*  CREATININE 17.91* 11.27*  CALCIUM 8.3* 8.3*   No results for input(s): PTH in the last 72 hours. Iron Studies: No results for input(s): IRON, TIBC, TRANSFERRIN, FERRITIN in the last 72 hours.  Studies/Results: No results found.  I have reviewed the patient's current medications.  Assessment/Plan: 1 ESRD HD TTS. Stable, much better vol 2 Anemia esa/Fe 3 HPTH vit D 4 Obesity 5 COPD 6 Access will ask VVS to eval, prob need leg AVG 7 Afib  Stop Eliquis  1 NOT APPROVED FOR USE IN ESRD , 2 MORTALITY HIGHER WITH ANTICOAG OVERALL IN ESRD    SO IF USE ANTICOAG , SHOULD USE WARFARIN P HD Sat, VVS to eval, Warfarin    LOS: 3 days   Montavious Wierzba L 07/19/2016,8:35 AM

## 2016-07-19 NOTE — Progress Notes (Signed)
[facility] for Heparin Indication: atrial fibrillation  Allergies  Allergen Reactions  . Penicillins Other (See Comments)    Reaction:  Unknown     Patient Measurements: Height: 5\' 7"  (170.2 cm) Weight: 178 lb 5.6 oz (80.9 kg) IBW/kg (Calculated) : 66.1 Heparin Dosing Weight: 85.1  Vital Signs: Temp: 98.4 F (36.9 C) (10/27 1218) Temp Source: Oral (10/27 1218) BP: 134/83 (10/27 1218) Pulse Rate: 79 (10/27 1218)  Labs:  Recent Labs  07/16/16 1417 07/17/16 0806 07/17/16 0815 07/18/16 0501 07/19/16 0209  HGB 11.9* 11.6*  --  10.4* 11.6*  HCT 37.3* 35.6*  --  32.9* 37.0*  PLT 205 189  --  171 170  APTT  --  38*  --  29 30  LABPROT  --   --  15.3*  --   --   INR  --   --  1.20  --   --   HEPARINUNFRC  --   --  0.16* <0.10* <0.10*  CREATININE 17.91*  --  11.27*  --   --     Estimated Creatinine Clearance: 6.7 mL/min (by C-G formula based on SCr of 11.27 mg/dL (H)).  Assessment: [facility] is an 65 y.o. male who is ESRD and has been sent to the ED secondary to LUE pain and a concern for possible infection. On Eliquis PTA for Afib. Pharmacy to start heparin with vascular procedure planned for Monday 10/30. Noted plans to begin warfarin post procedure and not resume apixaban. CBC stable    Goal of Therapy:  Heparin level 0.3-0.7 units/ml Monitor platelets by anticoagulation protocol: Yes   Plan:  1. Restart heparin at 1400 units/hr - will not bolus as recently had oozing from HD cath 2. Heparin level in 8 hours  3. Monitor daily HL, CBC, s/s of bleed 4. To begin warfarin post vascular procedure that is currently planned for 07/22/16   Vincenza Hews, PharmD, BCPS 07/19/2016, 12:48 PM Pager: [phone]

## 2016-07-19 NOTE — Plan of Care (Signed)
Problem: Education: Goal: Knowledge of [facility] Education information/materials will improve Outcome: Not Progressing Pt agitated with flat affect tonight, non interested in education at this time

## 2016-07-19 NOTE — Progress Notes (Addendum)
Initial Nutrition Assessment  DOCUMENTATION CODES:   Not applicable  INTERVENTION:    Continue Nepro Shake po BID, each supplement provides 425 kcal and 19 grams protein  NUTRITION DIAGNOSIS:   Increased nutrient needs related to chronic illness as evidenced by estimated needs  GOAL:   Patient will meet greater than or equal to 90% of their needs  MONITOR:   PO intake, Supplement acceptance, Labs, Weight trends, Skin, I & O's  REASON FOR ASSESSMENT:   Malnutrition Screening Tool  ASSESSMENT:   65 yo Male on the OR schedule tomorrow for ligation of his left BCF.  He came to the ED today secondary to progressive dyspnea.  Last HD was 3 days ago.  He is c/o pain at his fistula site.  He does not have a catheter.  He is frequently non-compliant with HD.  Pt sleeping upon visit >> did not wake. PO intake 100% this AM for breakfast. Per readings below, pt's weight has fluctuated some; likely due to fluid; no significant loss. Nutrient needs increased given ESRD on HD, COPD. Has Nepro Shake oral nutrition supplement ordered BID.  RD unable to complete Nutrition Focused Physical Exam at this time.  Diet Order:  Diet renal/carb modified with fluid restriction Diet-HS Snack? Nothing; Room service appropriate? Yes; Fluid consistency: Thin  Skin:  Reviewed, no issues  Last BM:  N/A  Height:   Ht Readings from Last 1 Encounters:  07/16/16 5\' 7"  (1.702 m)    Weight:   Wt Readings from Last 1 Encounters:  07/19/16 178 lb 5.6 oz (80.9 kg)    Wt Readings from Last 10 Encounters:  07/19/16 178 lb 5.6 oz (80.9 kg)  07/16/16 200 lb 2 oz (90.8 kg)  07/12/16 191 lb (86.6 kg)  06/20/16 201 lb 9.6 oz (91.4 kg)  06/13/16 193 lb 5.5 oz (87.7 kg)  06/13/16 200 lb 8 oz (90.9 kg)  06/11/16 200 lb 6 oz (90.9 kg)  06/24/16 198 lb (89.8 kg)  05/08/16 191 lb (86.6 kg)  05/02/16 191 lb (86.6 kg)   Ideal Body Weight:  67.2 kg  BMI:  Body mass index is 27.93 kg/m.  Estimated  Nutritional Needs:   Kcal:  2100-2300  Protein:  105-115 gm  Fluid:  1200 ml  EDUCATION NEEDS:   No education needs identified at this time  Arthur Holms, RD, LDN Pager #: [phone] After-Hours Pager #: [phone]
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
## 2016-07-19 NOTE — Progress Notes (Addendum)
[facility]  History of Present Illness: 65 y/o male followed by our practice.  He recently had his left UE AV fistula legated secondary to venous hypertension.  He currently is on HD via left femoral catheter.  We have now been asked to provide new access.  He is normally on Eliquis.  This is being held       Objective (!) 125/111 99 98.3 F (36.8 C) (Oral) 18 100%  Intake/Output Summary (Last 24 hours) at 07/19/16 1109 Last data filed at 07/19/16 0925  Gross per 24 hour  Intake              720 ml  Output             1440 ml  Net             -720 ml    He has bilateral popliteal pulses, right PT palpable, left no palpable pedal pulses Left femoral cath in place Left UE incision healing well   Assessment/Planning: POD # 2 Ligation of left upper arm AV fistula  Pending ABI's we will plan left femoral AV graft  Theda Sers, EMMA MAUREEN 07/19/2016 11:09 AM --  Laboratory Lab Results:  Recent Labs  07/18/16 0501 07/19/16 0209  WBC 5.7 6.4  HGB 10.4* 11.6*  HCT 32.9* 37.0*  PLT 171 170   BMET  Recent Labs  07/16/16 1417 07/17/16 0815  NA 138 136  K 5.7* 4.7  CL 96* 95*  CO2 22 26  GLUCOSE 110* 81  BUN 82* 37*  CREATININE 17.91* 11.27*  CALCIUM 8.3* 8.3*    COAG Lab Results  Component Value Date   INR 1.20 07/17/2016   INR 1.36 03/16/2016   INR 1.18 12/08/2015   No results found for: PTT  I have examined the patient, reviewed and agree with above. Currently dialysis via right femoral tunneled catheter. Known of her central occlusion. Palpable popliteal pulses bilaterally. No pedal pulses on the left. 1-2+ right posterior tibial pulse. We will plan left femoral loop graft Week. Will obtain baseline arterial lower extremity studies will tentatively plan for left femoral loop graft on Monday at 7:30. Will need to arrange dialysis around this procedure.  Curt Jews, MD 07/19/2016 11:28 AM

## 2016-07-20 DIAGNOSIS — N179 Acute kidney failure, unspecified: Secondary | ICD-10-CM | POA: Diagnosis not present

## 2016-07-20 DIAGNOSIS — T82898A Other specified complication of vascular prosthetic devices, implants and grafts, initial encounter: Secondary | ICD-10-CM | POA: Diagnosis not present

## 2016-07-20 DIAGNOSIS — N189 Chronic kidney disease, unspecified: Secondary | ICD-10-CM | POA: Diagnosis not present

## 2016-07-20 DIAGNOSIS — M79602 Pain in left arm: Secondary | ICD-10-CM | POA: Diagnosis not present

## 2016-07-20 LAB — RENAL FUNCTION PANEL
ALBUMIN: 3.5 g/dL (ref 3.5–5.0)
ANION GAP: 12 (ref 5–15)
BUN: 14 mg/dL (ref 6–20)
CO2: 25 mmol/L (ref 22–32)
Calcium: 8.6 mg/dL — ABNORMAL LOW (ref 8.9–10.3)
Chloride: 93 mmol/L — ABNORMAL LOW (ref 101–111)
Creatinine, Ser: 5.28 mg/dL — ABNORMAL HIGH (ref 0.61–1.24)
GFR calc Af Amer: 12 mL/min — ABNORMAL LOW (ref >60)
GFR, EST NON AFRICAN AMERICAN: 10 mL/min — AB (ref >60)
Glucose, Bld: 189 mg/dL — ABNORMAL HIGH (ref 65–99)
PHOSPHORUS: 3.6 mg/dL (ref 2.5–4.6)
POTASSIUM: 3.2 mmol/L — AB (ref 3.5–5.1)
Sodium: 130 mmol/L — ABNORMAL LOW (ref 135–145)

## 2016-07-20 LAB — CBC WITH DIFFERENTIAL/PLATELET
Basophils Absolute: 0 10*3/uL (ref 0.0–0.1)
Basophils Relative: 0 %
EOS ABS: 0.5 10*3/uL (ref 0.0–0.7)
Eosinophils Relative: 6 %
HCT: 35 % — ABNORMAL LOW (ref 39.0–52.0)
HEMOGLOBIN: 11.4 g/dL — AB (ref 13.0–17.0)
LYMPHS ABS: 0.5 10*3/uL — AB (ref 0.7–4.0)
LYMPHS PCT: 6 %
MCH: 28.1 pg (ref 26.0–34.0)
MCHC: 32.6 g/dL (ref 30.0–36.0)
MCV: 86.2 fL (ref 78.0–100.0)
Monocytes Absolute: 0.8 10*3/uL (ref 0.1–1.0)
Monocytes Relative: 10 %
NEUTROS ABS: 6.4 10*3/uL (ref 1.7–7.7)
NEUTROS PCT: 78 %
Platelets: 167 10*3/uL (ref 150–400)
RBC: 4.06 MIL/uL — AB (ref 4.22–5.81)
RDW: 16.6 % — ABNORMAL HIGH (ref 11.5–15.5)
WBC: 8.2 10*3/uL (ref 4.0–10.5)

## 2016-07-20 LAB — BASIC METABOLIC PANEL
Anion gap: 13 (ref 5–15)
BUN: 14 mg/dL (ref 6–20)
CHLORIDE: 92 mmol/L — AB (ref 101–111)
CO2: 25 mmol/L (ref 22–32)
Calcium: 8.5 mg/dL — ABNORMAL LOW (ref 8.9–10.3)
Creatinine, Ser: 5.17 mg/dL — ABNORMAL HIGH (ref 0.61–1.24)
GFR calc Af Amer: 12 mL/min — ABNORMAL LOW (ref >60)
GFR calc non Af Amer: 11 mL/min — ABNORMAL LOW (ref >60)
Glucose, Bld: 191 mg/dL — ABNORMAL HIGH (ref 65–99)
POTASSIUM: 3.2 mmol/L — AB (ref 3.5–5.1)
SODIUM: 130 mmol/L — AB (ref 135–145)

## 2016-07-20 LAB — PROCALCITONIN: Procalcitonin: 0.55 ng/mL

## 2016-07-20 LAB — HEPARIN LEVEL (UNFRACTIONATED)
HEPARIN UNFRACTIONATED: 0.52 [IU]/mL (ref 0.30–0.70)
HEPARIN UNFRACTIONATED: 0.87 [IU]/mL — AB (ref 0.30–0.70)
Heparin Unfractionated: 1.88 IU/mL — ABNORMAL HIGH (ref 0.30–0.70)

## 2016-07-20 MED ORDER — CALCITRIOL 0.5 MCG PO CAPS
ORAL_CAPSULE | ORAL | Status: AC
  Filled 2016-07-20: qty 1

## 2016-07-20 NOTE — Progress Notes (Signed)
[facility] for Heparin Indication: atrial fibrillation  Allergies  Allergen Reactions  . Penicillins Other (See Comments)    Reaction:  Unknown     Patient Measurements: Height: 5\' 7"  (170.2 cm) Weight: 171 lb 8.3 oz (77.8 kg) IBW/kg (Calculated) : 66.1 Heparin Dosing Weight: 85.1  Vital Signs: Temp: 97.9 F (36.6 C) (10/28 1125) Temp Source: Oral (10/28 1125) BP: 108/54 (10/28 1430) Pulse Rate: 97 (10/28 1430)  Labs:  Recent Labs  07/18/16 0501 07/19/16 0209 07/19/16 2130 07/20/16 1100 07/20/16 1302  HGB 10.4* 11.6*  --   --   --   HCT 32.9* 37.0*  --   --   --   PLT 171 170  --   --   --   APTT 29 30  --   --   --   HEPARINUNFRC <0.10* <0.10* 0.27* 1.88* 0.52    Estimated Creatinine Clearance: 6.1 mL/min (by C-G formula based on SCr of 11.27 mg/dL (H)).  Assessment: [facility] is an 65 y.o. male who is ESRD and has been sent to the ED secondary to LUE pain and a concern for possible infection. On Eliquis PTA for Afib. Pharmacy to start heparin with vascular procedure planned for Monday 10/30. Noted plans to begin warfarin post procedure and not resume apixaban.  Heparin level this morning is therapeutic after a rate change this morning (HL 0.52 << 0.27, goal of 0.3-0.7). CBC stable - no overt s/sx of bleeding noted.  Goal of Therapy:  Heparin level 0.3-0.7 units/ml Monitor platelets by anticoagulation protocol: Yes   Plan:  1. Continue Heparin at 1500 units/hr (15 ml/hr( 2. Will continue to monitor for any signs/symptoms of bleeding and will follow up with heparin level in 8 hours   Thank you for allowing pharmacy to be a part of this patient's care.  Alycia Rossetti, PharmD, BCPS Clinical Pharmacist Pager: [phone] 07/20/2016 3:30 PM

## 2016-07-20 NOTE — Progress Notes (Signed)
[facility] for Heparin Indication: atrial fibrillation  Allergies  Allergen Reactions  . Penicillins Other (See Comments)    Reaction:  Unknown     Patient Measurements: Height: 5\' 7"  (170.2 cm) Weight: 166 lb 0.1 oz (75.3 kg) IBW/kg (Calculated) : 66.1 Heparin Dosing Weight: 85.1  Vital Signs: Temp: 98.2 F (36.8 C) (10/28 2004) Temp Source: Oral (10/28 2004) BP: 111/82 (10/28 2004) Pulse Rate: 87 (10/28 2004)  Labs:  Recent Labs  07/18/16 0501 07/19/16 0209  07/20/16 1100 07/20/16 1302 07/20/16 1720 07/20/16 1721 07/20/16 2105  HGB 10.4* 11.6*  --   --   --  11.4*  --   --   HCT 32.9* 37.0*  --   --   --  35.0*  --   --   PLT 171 170  --   --   --  167  --   --   APTT 29 30  --   --   --   --   --   --   HEPARINUNFRC <0.10* <0.10*  < > 1.88* 0.52  --   --  0.87*  CREATININE  --   --   --   --   --  5.17* 5.28*  --   < > = values in this interval not displayed.  Estimated Creatinine Clearance: 13 mL/min (by C-G formula based on SCr of 5.28 mg/dL (H)).  Assessment: Noah Lewis is an 65 y.o. male who is ESRD and has been sent to the ED secondary to LUE pain and a concern for possible infection. On Eliquis PTA for Afib. Pharmacy to start heparin with vascular procedure planned for Monday 10/30. Noted plans to begin warfarin post procedure and not resume apixaban.  Heparin level this evening is slightly SUPRAtherapeutic (HL 0.87 << 0.52, goal of 0.3-0.7). CBC stable - no overt s/sx of bleeding noted.  It is likely that the heparin level this evening is falsely elevated since the patient's heparin is running in the R-hand and the level drawn from the R-arm above the infusion site. The patient has been refusing lab draws - so will reduce the drip rate slightly and recheck in the AM.  Goal of Therapy:  Heparin level 0.3-0.7 units/ml Monitor platelets by anticoagulation protocol: Yes   Plan:  1. Reduce Heparin drip to 1450  units/hr (14.5 ml/hr) 2. Will continue to monitor for any signs/symptoms of bleeding and will follow up with heparin level in 8 hours   Thank you for allowing pharmacy to be a part of this patient's care.  Alycia Rossetti, PharmD, BCPS Clinical Pharmacist Pager: [phone] 07/20/2016 10:05 PM

## 2016-07-20 NOTE — Progress Notes (Signed)
Subjective: Interval History: has no complaint, refused to go to HD until had breakfast.  Objective: Vital signs in last 24 hours: Temp:  [98.2 F (36.8 C)-99 F (37.2 C)] 98.5 F (36.9 C) (10/28 0428) Pulse Rate:  [78-83] 78 (10/27 2011) Resp:  [10-17] 13 (10/28 0428) BP: (130-144)/(83-100) 141/96 (10/28 0428) SpO2:  [97 %-100 %] 100 % (10/27 2011) Weight change:   Intake/Output from previous day: 10/27 0701 - 10/28 0700 In: 801.1 [P.O.:600; I.V.:201.1] Out: -  Intake/Output this shift: No intake/output data recorded.  General appearance: cooperative, moderately obese, slowed mentation and Ox2, less facial edema,  Resp: diminished breath sounds bilaterally Cardio: regular rate and rhythm and systolic murmur: holosystolic 2/6, blowing at apex GI: obese, pos bs, liver down 5 cm Extremities: edema 1+ and erosions L avf, healing. R groin cath  Lab Results:  Recent Labs  07/18/16 0501 07/19/16 0209  WBC 5.7 6.4  HGB 10.4* 11.6*  HCT 32.9* 37.0*  PLT 171 170   BMET: No results for input(s): NA, K, CL, CO2, GLUCOSE, BUN, CREATININE, CALCIUM in the last 72 hours. No results for input(s): PTH in the last 72 hours. Iron Studies: No results for input(s): IRON, TIBC, TRANSFERRIN, FERRITIN in the last 72 hours.  Studies/Results: No results found.  I have reviewed the patient's current medications.  Assessment/Plan: 1 ESRD for Hd.  Still some xs vol 2 Anemia stable 3 HPTH vit D 4 Obesity 5 slowed mentation at baseline 6 Nonadherence 7 Afib on hep 8 Access for avg in groin on Mon P HD, groin avg, mobilize    LOS: 4 days   [place] L 07/20/2016,8:37 AM

## 2016-07-20 NOTE — Clinical Social Work Note (Signed)
Clinical Social Work Assessment  Patient Details  Name: [facility] MRN: [id number] Date of Birth: 08-02-1951  Date of referral:  07/20/16               Reason for consult:  Facility Placement, Discharge Planning                Permission sought to share information with:  Other (Staff @ [facility]) Permission granted to share information::  Yes, Verbal Permission Granted  Name::     [facility] [phone] [facility])  Agency::     Relationship::     Contact Information:     Housing/Transportation Living arrangements for the past 2 months:  [place] of Information:  Patient Patient Interpreter Needed:    Criminal Activity/Legal Involvement Pertinent to Current Situation/Hospitalization:  No - Comment as needed Significant Relationships:  Parents [facility] [phone]) Lives with:  Other (Comment) (SNF - [facility]) Do you feel safe going back to the place where you live?  Yes Need for family participation in patient care:  No (Coment)  Care giving concerns:  Pt is a resident @ [facility] and is agreeable to go back to facility once medically cleared.    Social Worker assessment / plan:  CSW met with pt @ bedside. Pt alert and oriented X2. No family present at this time. CSW spoke to pt's brother [facility] who reported pt going back to [facility] is ok with family. Noah also reported that he would like CSW to set up transportation for pt to return to facility, pt's mother can no longer drive and he lives in [facility]. Pt hospitalized due to non-compliance with HD. CSW discussed the importance of HD as it relates to pt's dx of ESRD, pt did not respond.Pt has been a long term resident @ [facility].  Pt reports that he is ok with going back to SNF ([facility]) once medically cleared. CSW will continue to monitor pt/status to ensure pt is safely discharged back to SNF. CSW will remain available for pt/family as needed for any support.   Employment  status:  Disabled (Comment on whether or not currently receiving Disability) (Pt long term @ [employer]) Insurance information:  Medicare, Medicaid [place] PT Recommendations:  [facility] / Referral to community resources:     Patient/Family's Response to care:  Patient appears to be appreciative of staff and CSW's involvement in his care and DC planning.    Patient/Family's Understanding of and Emotional Response to Diagnosis, Current Treatment, and Prognosis:  Pt is aware of dx ESRD, CSW addressed the importance of HD and pt's non-compliance. Pt appears to be adjusting to condition, CSW will remain available for any emotional support as needed.   Emotional Assessment Appearance:  Appears stated age Attitude/Demeanor/Rapport:  Other (Cooperative) Affect (typically observed):  Guarded Orientation:  Oriented to Self, Oriented to Situation Alcohol / Substance use:  Not Applicable Psych involvement (Current and /or in the community):  No (Comment)  Discharge Needs  Concerns to be addressed:  Other (Comment Required (Non-compliant with HD) Readmission within the last 30 days:  No Current discharge risk:  Cognitively Impaired, Chronically ill Barriers to Discharge:  Continued Medical Work up   [facility], [facility], [facility] 07/20/2016, 11:37 AM

## 2016-07-20 NOTE — Progress Notes (Signed)
CSW spoke to [facility], ok for pt to return. CSW will update Tammy on DC date once pt is medically cleared to transfer back to facility.   Naysa Puskas B. [place], [place]
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
## 2016-07-20 NOTE — Progress Notes (Signed)
ANTICOAGULATION CONSULT NOTE - Follow Up Consult  Pharmacy Consult for Heparin  Indication: atrial fibrillation  Allergies  Allergen Reactions  . Penicillins Other (See Comments)    Reaction:  Unknown     Patient Measurements: Height: 5\' 7"  (170.2 cm) Weight: 178 lb 5.6 oz (80.9 kg) IBW/kg (Calculated) : 66.1  Vital Signs: Temp: 98.3 F (36.8 C) (10/27 2346) Temp Source: Oral (10/27 2346) BP: 130/100 (10/27 2346) Pulse Rate: 78 (10/27 2011)  Labs:  Recent Labs  07/17/16 0806  07/17/16 0815 07/18/16 0501 07/19/16 0209 07/19/16 2130  HGB 11.6*  --   --  10.4* 11.6*  --   HCT 35.6*  --   --  32.9* 37.0*  --   PLT 189  --   --  171 170  --   APTT 38*  --   --  29 30  --   LABPROT  --   --  15.3*  --   --   --   INR  --   --  1.20  --   --   --   HEPARINUNFRC  --   < > 0.16* <0.10* <0.10* 0.27*  CREATININE  --   --  11.27*  --   --   --   < > = values in this interval not displayed.  Estimated Creatinine Clearance: 6.7 mL/min (by C-G formula based on SCr of 11.27 mg/dL (H)).   Assessment: Heparin level is sub-therapeutic after re-start, had been on hold due to bleeding from HD cath, no issues tonight per RN  Goal of Therapy:  Heparin level 0.3-0.7 units/ml Monitor platelets by anticoagulation protocol: Yes   Plan:  -Inc heparin to 1500 units/hr -0900 HL  Alyshia Kernan 07/20/2016,12:50 AM

## 2016-07-21 ENCOUNTER — Encounter (HOSPITAL_COMMUNITY): Payer: Self-pay

## 2016-07-21 DIAGNOSIS — N189 Chronic kidney disease, unspecified: Secondary | ICD-10-CM | POA: Diagnosis not present

## 2016-07-21 DIAGNOSIS — N179 Acute kidney failure, unspecified: Secondary | ICD-10-CM | POA: Diagnosis not present

## 2016-07-21 MED ORDER — HYDRALAZINE HCL 20 MG/ML IJ SOLN
5.0000 mg | Freq: Three times a day (TID) | INTRAMUSCULAR | Status: DC | PRN
  Administered 2016-07-21: 5 mg via INTRAVENOUS

## 2016-07-21 MED ORDER — METOPROLOL TARTRATE 12.5 MG HALF TABLET
12.5000 mg | ORAL_TABLET | Freq: Two times a day (BID) | ORAL | Status: DC
  Administered 2016-07-21: 12.5 mg via ORAL
  Filled 2016-07-21 (×3): qty 1

## 2016-07-21 NOTE — Progress Notes (Signed)
PT Cancellation Note  Patient Details Name: Noah Lewis MRN: [id number] DOB: 1950-11-10   Cancelled Treatment:    Reason Eval/Treat Not Completed: Medical issues which prohibited therapy.  Patient with Temporary HD catheter Rt femoral vein.  Will need to wait for this catheter to be removed prior to mobility.  Will continue to monitor.   Despina Pole 07/21/2016, 12:57 PM Carita Pian. Sanjuana Kava, [place] Pager [phone]
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
## 2016-07-21 NOTE — Progress Notes (Signed)
Pt refused all night time medications. Consent brought in at same time for procedure tomorrow. Pt states that he knows nothing about it and is planning on going home tomorrow. Pt did not sign the consent at this time. I explained that he is on the schedule for tomorrow and he stated that "I don't want to go". Will mad MD aware and pass to day shift.

## 2016-07-21 NOTE — Progress Notes (Signed)
Subjective: Interval History: has complaints wants to get up.  Objective: Vital signs in last 24 hours: Temp:  [97.1 F (36.2 C)-98.4 F (36.9 C)] 97.9 F (36.6 C) (10/29 0753) Pulse Rate:  [77-102] 80 (10/29 0753) Resp:  [13-23] 13 (10/29 0753) BP: (102-151)/(54-123) 137/113 (10/29 0753) SpO2:  [97 %-100 %] 97 % (10/29 0753) Weight:  [75.3 kg (166 lb 0.1 oz)-77.8 kg (171 lb 8.3 oz)] 75.3 kg (166 lb 0.1 oz) (10/28 1539) Weight change:   Intake/Output from previous day: 10/28 0701 - 10/29 0700 In: 566.7 [P.O.:240; I.V.:326.7] Out: 2500  Intake/Output this shift: No intake/output data recorded.  General appearance: alert, cooperative, no distress and moderately obese Resp: diminished breath sounds bilaterally Cardio: S1, S2 normal and systolic murmur: holosystolic 2/6, blowing at apex, Regular this am GI: obese, liver down 5 cm, soft Extremities: erosions on LUA healing,  R fem cath, tr edema  Lab Results:  Recent Labs  07/19/16 0209 07/20/16 1720  WBC 6.4 8.2  HGB 11.6* 11.4*  HCT 37.0* 35.0*  PLT 170 167   BMET:  Recent Labs  07/20/16 1720 07/20/16 1721  NA 130* 130*  K 3.2* 3.2*  CL 92* 93*  CO2 25 25  GLUCOSE 191* 189*  BUN 14 14  CREATININE 5.17* 5.28*  CALCIUM 8.5* 8.6*   No results for input(s): PTH in the last 72 hours. Iron Studies: No results for input(s): IRON, TIBC, TRANSFERRIN, FERRITIN in the last 72 hours.  Studies/Results: No results found.  I have reviewed the patient's current medications.  Assessment/Plan: 1 ESRD did well on HD.  Some bp issues yet 2 DM controlled 3 Anemia stable on ESA 4 HTN volatile 5 Afib in NSR this am 6 Obesity 7 Access for leg AVG 8 NONADHERENCE P HD TTS, Leg AVG, anticoag    LOS: 5 days   Gracelyn Coventry L 07/21/2016,8:19 AM

## 2016-07-22 ENCOUNTER — Inpatient Hospital Stay (HOSPITAL_COMMUNITY): Payer: Medicare Other | Admitting: Anesthesiology

## 2016-07-22 ENCOUNTER — Encounter (HOSPITAL_COMMUNITY)
Admission: EM | Disposition: A | Discharge status: Skilled Nursing Facility | Payer: Self-pay | Source: Home / Self Care | Attending: Family Medicine

## 2016-07-22 ENCOUNTER — Telehealth: Payer: Self-pay | Admitting: Vascular Surgery

## 2016-07-22 DIAGNOSIS — N179 Acute kidney failure, unspecified: Secondary | ICD-10-CM | POA: Diagnosis not present

## 2016-07-22 DIAGNOSIS — N189 Chronic kidney disease, unspecified: Secondary | ICD-10-CM | POA: Diagnosis not present

## 2016-07-22 DIAGNOSIS — N185 Chronic kidney disease, stage 5: Secondary | ICD-10-CM

## 2016-07-22 DIAGNOSIS — M79602 Pain in left arm: Secondary | ICD-10-CM | POA: Diagnosis not present

## 2016-07-22 DIAGNOSIS — T82898A Other specified complication of vascular prosthetic devices, implants and grafts, initial encounter: Secondary | ICD-10-CM

## 2016-07-22 HISTORY — PX: AV FISTULA PLACEMENT: SHX1204

## 2016-07-22 LAB — CBC WITH DIFFERENTIAL/PLATELET
Basophils Absolute: 0 10*3/uL (ref 0.0–0.1)
Basophils Relative: 0 %
EOS ABS: 0.6 10*3/uL (ref 0.0–0.7)
Eosinophils Relative: 6 %
HEMATOCRIT: 36.4 % — AB (ref 39.0–52.0)
HEMOGLOBIN: 12 g/dL — AB (ref 13.0–17.0)
LYMPHS ABS: 1.1 10*3/uL (ref 0.7–4.0)
Lymphocytes Relative: 12 %
MCH: 28.4 pg (ref 26.0–34.0)
MCHC: 33 g/dL (ref 30.0–36.0)
MCV: 86.1 fL (ref 78.0–100.0)
Monocytes Absolute: 0.9 10*3/uL (ref 0.1–1.0)
Monocytes Relative: 9 %
NEUTROS PCT: 73 %
Neutro Abs: 7 10*3/uL (ref 1.7–7.7)
Platelets: 201 10*3/uL (ref 150–400)
RBC: 4.23 MIL/uL (ref 4.22–5.81)
RDW: 16.9 % — ABNORMAL HIGH (ref 11.5–15.5)
WBC: 9.6 10*3/uL (ref 4.0–10.5)

## 2016-07-22 LAB — RENAL FUNCTION PANEL
ANION GAP: 15 (ref 5–15)
Albumin: 3.6 g/dL (ref 3.5–5.0)
BUN: 49 mg/dL — ABNORMAL HIGH (ref 6–20)
CHLORIDE: 95 mmol/L — AB (ref 101–111)
CO2: 24 mmol/L (ref 22–32)
CREATININE: 10.21 mg/dL — AB (ref 0.61–1.24)
Calcium: 9.3 mg/dL (ref 8.9–10.3)
GFR, EST AFRICAN AMERICAN: 5 mL/min — AB (ref >60)
GFR, EST NON AFRICAN AMERICAN: 5 mL/min — AB (ref >60)
Glucose, Bld: 96 mg/dL (ref 65–99)
POTASSIUM: 4.7 mmol/L (ref 3.5–5.1)
Phosphorus: 5.8 mg/dL — ABNORMAL HIGH (ref 2.5–4.6)
Sodium: 134 mmol/L — ABNORMAL LOW (ref 135–145)

## 2016-07-22 LAB — GLUCOSE, CAPILLARY: GLUCOSE-CAPILLARY: 111 mg/dL — AB (ref 65–99)

## 2016-07-22 LAB — PROTIME-INR
INR: 1.11
Prothrombin Time: 14.4 seconds (ref 11.4–15.2)

## 2016-07-22 SURGERY — INSERTION OF ARTERIOVENOUS (AV) GORE-TEX GRAFT THIGH
Anesthesia: General | Site: Groin | Laterality: Left

## 2016-07-22 MED ORDER — HEPARIN SODIUM (PORCINE) 1000 UNIT/ML IJ SOLN
INTRAMUSCULAR | Status: DC | PRN
  Administered 2016-07-22: 60 [IU] via INTRAVENOUS
  Administered 2016-07-22: 75 [IU] via INTRAVENOUS
  Administered 2016-07-22: 5000 [IU] via INTRAVENOUS

## 2016-07-22 MED ORDER — WARFARIN - PHARMACIST DOSING INPATIENT: Freq: Every day | Status: DC

## 2016-07-22 MED ORDER — PROPOFOL 10 MG/ML IV BOLUS
INTRAVENOUS | Status: AC
  Filled 2016-07-22: qty 20

## 2016-07-22 MED ORDER — HEPARIN SODIUM (PORCINE) 5000 UNIT/ML IJ SOLN
INTRAMUSCULAR | Status: DC | PRN
  Administered 2016-07-22: 500 mL

## 2016-07-22 MED ORDER — EPHEDRINE SULFATE 50 MG/ML IJ SOLN
INTRAMUSCULAR | Status: DC | PRN
  Administered 2016-07-22: 5 mg via INTRAVENOUS

## 2016-07-22 MED ORDER — PROTAMINE SULFATE 10 MG/ML IV SOLN
INTRAVENOUS | Status: AC
  Filled 2016-07-22: qty 5

## 2016-07-22 MED ORDER — LIDOCAINE-EPINEPHRINE 1 %-1:100000 IJ SOLN
INTRAMUSCULAR | Status: AC
  Filled 2016-07-22: qty 1

## 2016-07-22 MED ORDER — FENTANYL CITRATE (PF) 100 MCG/2ML IJ SOLN
INTRAMUSCULAR | Status: DC | PRN
  Administered 2016-07-22 (×2): 50 ug via INTRAVENOUS

## 2016-07-22 MED ORDER — LIDOCAINE HCL (PF) 1 % IJ SOLN: 5.0000 mL | INTRAMUSCULAR | Status: DC | PRN

## 2016-07-22 MED ORDER — OXYCODONE-ACETAMINOPHEN 5-325 MG PO TABS
1.0000 | ORAL_TABLET | ORAL | Status: DC | PRN
  Filled 2016-07-22: qty 1

## 2016-07-22 MED ORDER — PROPOFOL 10 MG/ML IV BOLUS
INTRAVENOUS | Status: DC | PRN
  Administered 2016-07-22: 170 mg via INTRAVENOUS

## 2016-07-22 MED ORDER — PHENYLEPHRINE HCL 10 MG/ML IJ SOLN
INTRAMUSCULAR | Status: DC | PRN
  Administered 2016-07-22: 180 ug via INTRAVENOUS

## 2016-07-22 MED ORDER — PHENYLEPHRINE HCL 10 MG/ML IJ SOLN
INTRAVENOUS | Status: DC | PRN
  Administered 2016-07-22: 40 ug/min via INTRAVENOUS

## 2016-07-22 MED ORDER — LIDOCAINE HCL (CARDIAC) 20 MG/ML IV SOLN
INTRAVENOUS | Status: DC | PRN
  Administered 2016-07-22: 60 mg via INTRATRACHEAL

## 2016-07-22 MED ORDER — FENTANYL CITRATE (PF) 100 MCG/2ML IJ SOLN
INTRAMUSCULAR | Status: AC
  Filled 2016-07-22: qty 2

## 2016-07-22 MED ORDER — ALBUMIN HUMAN 5 % IV SOLN
INTRAVENOUS | Status: DC | PRN
  Administered 2016-07-22: 09:00:00 via INTRAVENOUS

## 2016-07-22 MED ORDER — HEPARIN SODIUM (PORCINE) 1000 UNIT/ML IJ SOLN
INTRAMUSCULAR | Status: AC
  Filled 2016-07-22: qty 1

## 2016-07-22 MED ORDER — PROTAMINE SULFATE 10 MG/ML IV SOLN
INTRAVENOUS | Status: DC | PRN
  Administered 2016-07-22 (×2): 10 mg via INTRAVENOUS

## 2016-07-22 MED ORDER — SODIUM CHLORIDE 0.9 % IV SOLN: 100.0000 mL | INTRAVENOUS | Status: DC | PRN

## 2016-07-22 MED ORDER — MIDAZOLAM HCL 2 MG/2ML IJ SOLN
INTRAMUSCULAR | Status: AC
  Filled 2016-07-22: qty 2

## 2016-07-22 MED ORDER — PENTAFLUOROPROP-TETRAFLUOROETH EX AERO: 1.0000 "application " | INHALATION_SPRAY | CUTANEOUS | Status: DC | PRN

## 2016-07-22 MED ORDER — WARFARIN SODIUM 5 MG PO TABS
5.0000 mg | ORAL_TABLET | Freq: Once | ORAL | Status: AC
  Administered 2016-07-22: 5 mg via ORAL
  Filled 2016-07-22: qty 1

## 2016-07-22 MED ORDER — ALTEPLASE 2 MG IJ SOLR: 2.0000 mg | Freq: Once | INTRAMUSCULAR | Status: DC | PRN

## 2016-07-22 MED ORDER — 0.9 % SODIUM CHLORIDE (POUR BTL) OPTIME
TOPICAL | Status: DC | PRN
  Administered 2016-07-22: 2000 mL

## 2016-07-22 MED ORDER — HYDROMORPHONE HCL 1 MG/ML IJ SOLN: 0.2500 mg | INTRAMUSCULAR | Status: DC | PRN

## 2016-07-22 MED ORDER — LIDOCAINE-PRILOCAINE 2.5-2.5 % EX CREA: 1.0000 "application " | TOPICAL_CREAM | CUTANEOUS | Status: DC | PRN

## 2016-07-22 MED ORDER — HEMOSTATIC AGENTS (NO CHARGE) OPTIME
TOPICAL | Status: DC | PRN
  Administered 2016-07-22: 1 via TOPICAL

## 2016-07-22 MED ORDER — HEPARIN SODIUM (PORCINE) 1000 UNIT/ML DIALYSIS: 100.0000 [IU]/kg | INTRAMUSCULAR | Status: DC | PRN

## 2016-07-22 MED ORDER — HEPARIN SODIUM (PORCINE) 1000 UNIT/ML DIALYSIS: 1000.0000 [IU] | INTRAMUSCULAR | Status: DC | PRN

## 2016-07-22 MED ORDER — ONDANSETRON HCL 4 MG/2ML IJ SOLN
INTRAMUSCULAR | Status: DC | PRN
  Administered 2016-07-22: 4 mg via INTRAVENOUS

## 2016-07-22 SURGICAL SUPPLY — 39 items
ADH SKN CLS APL DERMABOND .7 (GAUZE/BANDAGES/DRESSINGS) ×2
ARMBAND PINK RESTRICT EXTREMIT (MISCELLANEOUS) ×2 IMPLANT
CANISTER SUCTION 2500CC (MISCELLANEOUS) ×3 IMPLANT
CLIP TI MEDIUM 6 (CLIP) ×5 IMPLANT
CLIP TI WIDE RED SMALL 6 (CLIP) ×3 IMPLANT
DERMABOND ADVANCED (GAUZE/BANDAGES/DRESSINGS) ×4
DERMABOND ADVANCED .7 DNX12 (GAUZE/BANDAGES/DRESSINGS) ×1 IMPLANT
ELECT REM PT RETURN 9FT ADLT (ELECTROSURGICAL) ×3
ELECTRODE REM PT RTRN 9FT ADLT (ELECTROSURGICAL) ×1 IMPLANT
GEL ULTRASOUND 20GR AQUASONIC (MISCELLANEOUS) IMPLANT
GLOVE BIO SURGEON STRL SZ 6.5 (GLOVE) ×3 IMPLANT
GLOVE BIO SURGEON STRL SZ7.5 (GLOVE) ×3 IMPLANT
GLOVE BIO SURGEONS STRL SZ 6.5 (GLOVE) ×3
GLOVE BIOGEL PI IND STRL 6.5 (GLOVE) IMPLANT
GLOVE BIOGEL PI INDICATOR 6.5 (GLOVE) ×6
GLOVE SURG SS PI 6.5 STRL IVOR (GLOVE) ×2 IMPLANT
GOWN STRL REUS W/ TWL LRG LVL3 (GOWN DISPOSABLE) ×2 IMPLANT
GOWN STRL REUS W/ TWL XL LVL3 (GOWN DISPOSABLE) ×1 IMPLANT
GOWN STRL REUS W/TWL LRG LVL3 (GOWN DISPOSABLE) ×12
GOWN STRL REUS W/TWL XL LVL3 (GOWN DISPOSABLE) ×3
GRAFT GORETEX 6X40 (Vascular Products) ×2 IMPLANT
HEMOSTAT SNOW SURGICEL 2X4 (HEMOSTASIS) ×2 IMPLANT
INSERT FOGARTY SM (MISCELLANEOUS) ×3 IMPLANT
KIT BASIN OR (CUSTOM PROCEDURE TRAY) ×3 IMPLANT
KIT ROOM TURNOVER OR (KITS) ×3 IMPLANT
NS IRRIG 1000ML POUR BTL (IV SOLUTION) ×5 IMPLANT
PACK CV ACCESS (CUSTOM PROCEDURE TRAY) ×3 IMPLANT
PAD ARMBOARD 7.5X6 YLW CONV (MISCELLANEOUS) ×6 IMPLANT
SPONGE GAUZE 4X4 12PLY STER LF (GAUZE/BANDAGES/DRESSINGS) ×2 IMPLANT
SUT GORETEX 6.0 TH-9 30 IN (SUTURE) ×1 IMPLANT
SUT GORETEX CV-6TTC-13 36IN (SUTURE) ×3 IMPLANT
SUT MNCRL AB 4-0 PS2 18 (SUTURE) ×4 IMPLANT
SUT PROLENE 6 0 BV (SUTURE) ×8 IMPLANT
SUT VIC AB 2-0 CT1 27 (SUTURE) ×3
SUT VIC AB 2-0 CT1 TAPERPNT 27 (SUTURE) ×1 IMPLANT
SUT VIC AB 3-0 SH 27 (SUTURE) ×6
SUT VIC AB 3-0 SH 27X BRD (SUTURE) ×2 IMPLANT
UNDERPAD 30X30 (UNDERPADS AND DIAPERS) ×3 IMPLANT
WATER STERILE IRR 1000ML POUR (IV SOLUTION) ×3 IMPLANT

## 2016-07-22 NOTE — Progress Notes (Signed)
ANTICOAGULATION CONSULT NOTE - Initial Consult  Pharmacy Consult for warfarin Indication: atrial fibrillation  Allergies  Allergen Reactions  . Penicillins Other (See Comments)    Reaction:  Unknown     Patient Measurements: Height: 5\' 7"  (170.2 cm) Weight: 166 lb 0.1 oz (75.3 kg) IBW/kg (Calculated) : 66.1  Vital Signs: Temp: 97.3 F (36.3 C) (10/30 1017) Temp Source: Oral (10/30 0400) BP: 105/71 (10/30 1032) Pulse Rate: 91 (10/30 1032)  Labs:  Recent Labs  07/20/16 1100 07/20/16 1302 07/20/16 1720 07/20/16 1721 07/20/16 2105 07/22/16 0554 07/22/16 0603  HGB  --   --  11.4*  --   --  12.0*  --   HCT  --   --  35.0*  --   --  36.4*  --   PLT  --   --  167  --   --  201  --   LABPROT  --   --   --   --   --  14.4  --   INR  --   --   --   --   --  1.11  --   HEPARINUNFRC 1.88* 0.52  --   --  0.87*  --   --   CREATININE  --   --  5.17* 5.28*  --   --  10.21*    Estimated Creatinine Clearance: 6.7 mL/min (by C-G formula based on SCr of 10.21 mg/dL (H)).  Assessment: 65 y.o. male who is ESRD and has been sent to the ED secondary to LUE pain and a concern for possible infection. PTA eliquis for afib  Anticoag: Eliquis PTA for hx Aifb - held this admit d/t surgery needs for L-AVF - s/p ligation on 10/25. Post-op with bleeding from [facility] no more apixaban per renal INR 1.11  Heme/Onc: Aranesp for anemia of ESRD.  H&H 12/36.4, Plt 201  Goal of Therapy:  INR 2-3 Monitor platelets by anticoagulation protocol: Yes   Plan:  Warfarin 5 mg x 1 Daily INR, CBC q72h Monitor for s/sx of bleeding  Levester Fresh, PharmD, BCPS, [facility] Clinical Pharmacist Pager [phone] 07/22/2016 1:13 PM

## 2016-07-22 NOTE — Progress Notes (Signed)
  Progress Note    07/22/2016 7:27 AM Day of Surgery  Subjective:  No complaints this a.m  Vitals:   07/22/16 0000 07/22/16 0400  BP: 118/83 (!) 148/105  Pulse: 75   Resp:    Temp: 98.4 F (36.9 C) 98.8 F (37.1 C)    Physical Exam: Awake and alert Abdomen is soft Left groin without previous surgical incision Palpable left popliteal pulse  CBC    Component Value Date/Time   WBC 9.6 07/22/2016 0554   RBC 4.23 07/22/2016 0554   HGB 12.0 (L) 07/22/2016 0554   HCT 36.4 (L) 07/22/2016 0554   PLT 201 07/22/2016 0554   MCV 86.1 07/22/2016 0554   MCH 28.4 07/22/2016 0554   MCHC 33.0 07/22/2016 0554   RDW 16.9 (H) 07/22/2016 0554   LYMPHSABS 1.1 07/22/2016 0554   MONOABS 0.9 07/22/2016 0554   EOSABS 0.6 07/22/2016 0554   BASOSABS 0.0 07/22/2016 0554    BMET    Component Value Date/Time   NA 134 (L) 07/22/2016 0603   NA 142 06/12/2016   K 4.7 07/22/2016 0603   CL 95 (L) 07/22/2016 0603   CO2 24 07/22/2016 0603   GLUCOSE 96 07/22/2016 0603   BUN 49 (H) 07/22/2016 0603   BUN 60 (A) 06/12/2016   CREATININE 10.21 (H) 07/22/2016 0603   CALCIUM 9.3 07/22/2016 0603   GFRNONAA 5 (L) 07/22/2016 0603   GFRAA 5 (L) 07/22/2016 0603    INR    Component Value Date/Time   INR 1.11 07/22/2016 0554     Intake/Output Summary (Last 24 hours) at 07/22/16 0727 Last data filed at 07/21/16 1900  Gross per 24 hour  Intake              840 ml  Output                0 ml  Net              840 ml     Assessment:  65 y.o. male with esrd s/p ligation of left upper arm avf Plan: To OR for left femoral av graft Consent signed No family currently in hospital   Boyce. Donzetta Matters, MD [facility] Office: [phone] Pager: [phone]  07/22/2016 7:27 AM

## 2016-07-22 NOTE — Telephone Encounter (Signed)
Spoke to facility for f/u appt on 11/17 mailing letter as well

## 2016-07-22 NOTE — Telephone Encounter (Signed)
-----   Message from Mena Goes, RN sent at 07/22/2016  9:34 AM EDT ----- Regarding: schedule   ----- Message ----- From: Alvia Grove, PA-C Sent: 07/22/2016   9:19 AM To: [facility] Charge Pool  S/p left thigh graft 07/22/16  F/u with Dr. Donzetta Matters in 2 weeks. No studies.  Thanks Maudie Mercury

## 2016-07-22 NOTE — Progress Notes (Signed)
Subjective: Interval History: L thigh AVG placed today by [facility].  Postop stalbe, labs reviewed.   Objective: Vital signs in last 24 hours: Temp:  [97.3 F (36.3 C)-98.8 F (37.1 C)] 97.3 F (36.3 C) (10/30 1017) Pulse Rate:  [72-93] 91 (10/30 1032) Resp:  [15-18] 18 (10/30 1032) BP: (97-148)/(67-105) 105/71 (10/30 1032) SpO2:  [93 %-100 %] 98 % (10/30 1032) Weight change:   Intake/Output from previous day: 10/29 0701 - 10/30 0700 In: 840 [P.O.:840] Out: -  Intake/Output this shift: Total I/O In: 1040 [P.O.:240; I.V.:550; IV Piggyback:250] Out: 100 [Blood:100]  General appearance: alert, cooperative, no distress and moderately obese Resp: diminished breath sounds bilaterally Cardio: S1, S2 normal and systolic murmur: holosystolic 2/6, blowing at apex, Regular this am GI: obese, liver down 5 cm, soft Extremities: erosions on LUA healing,  R fem cath, tr edema  Lab Results:  Recent Labs  07/20/16 1720 07/22/16 0554  WBC 8.2 9.6  HGB 11.4* 12.0*  HCT 35.0* 36.4*  PLT 167 201   BMET:   Recent Labs  07/20/16 1721 07/22/16 0603  NA 130* 134*  K 3.2* 4.7  CL 93* 95*  CO2 25 24  GLUCOSE 189* 96  BUN 14 49*  CREATININE 5.28* 10.21*  CALCIUM 8.6* 9.3   No results for input(s): PTH in the last 72 hours. Iron Studies: No results for input(s): IRON, TIBC, TRANSFERRIN, FERRITIN in the last 72 hours.  Studies/Results: No results found.  I have reviewed the patient's current medications.  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

## 2016-07-22 NOTE — Progress Notes (Signed)
MD Early paged to make aware that pt does not understand procedure. Dr Early spoke with pt on the phone and explained procedure and the reason needing it. Pt agreeing to continue with graft placement, consent signed and labs drawn that pt previously refused.

## 2016-07-22 NOTE — Op Note (Signed)
    OPERATIVE NOTE   PROCEDURE: Left femoral AV loop graft.  PRE-OPERATIVE DIAGNOSIS: End-stage renal disease   POST-OPERATIVE DIAGNOSIS: same  SURGEON: Keaisha Sublette C. Donzetta Matters, MD  ASSISTANT(S):  Virgina Jock PA  ANESTHESIA: general  ESTIMATED BLOOD LOSS: 25 cc  FINDING(S): A nondiseased left common femoral artery. At completion of procedure there was palpable thrill in the graft and signal in the runoff SFA distally.  SPECIMEN(S):  none  INDICATIONS:   Noah Lewis is a 65 y.o. male who presented  with swelling of his left upper extremity and underwent ligation of the left arm AV fistula. He then had tunneled dialysis catheter placed in his right groin. He is now indicated for left femoral AV graft.   DESCRIPTION: After full informed written consent was obtained from the patient, the patient was brought back to the operating room and placed supine upon the operating table.  general anesthesia was induced he was sterilely prepped and draped in the left leg timeout called. He had received antibiotics. A transverse incision was made overlying his femoral pulse we dissected down through skin and subcutaneous taste tissue identified the common femoral artery and dissected out the profunda and SFA distally placed vessel loops around those. Medially we identified the femoral vein and placed a vessel loop around that. A tunnel was created with a separate stab incision distally laterally on the thigh and a 6 mm loop graft was placed. Patient was given 5000 units of heparin. The graft was trimmed to size and sewn into side to our femoral vein with 6-0 Prolene suture. After flushing maneuvers it with the graft was then clamped the SFA was clamped and the graft sewn into side to our proximal SFA again with 6-0 Prolene suture. Prior to releasing our clamps we performed flushing maneuvers. We then administered protamine and obtained hemostasis of the wounds. Doppler demonstrated the above findings with  runoff flow in the SFA and there was a palpable thrill in the graft. The wounds were then closed in layers with 4-0 Monocryl at the level of the skin and Dermabond above that. Patient is laterally from anesthesia having tolerated the procedure quite well transferred PACU plans for return to his room today.   COMPLICATIONS: none immediate  CONDITION: stable   Noah Lewis C. Donzetta Matters, MD [facility] [place] Office: [phone] Pager: [phone]  07/22/2016, 9:12 AM

## 2016-07-22 NOTE — Anesthesia Preprocedure Evaluation (Signed)
Anesthesia Evaluation  Patient identified by MRN, date of birth, ID band Patient awake    Reviewed: Allergy & Precautions, NPO status , Patient's Chart, lab work & pertinent test results  Airway Mallampati: II       Dental   Pulmonary asthma , sleep apnea , COPD,    breath sounds clear to auscultation       Cardiovascular hypertension, + CAD and + Peripheral Vascular Disease   Rhythm:Regular Rate:Normal     Neuro/Psych    GI/Hepatic negative GI ROS, Neg liver ROS,   Endo/Other  diabetesHypothyroidism   Renal/GU Renal disease     Musculoskeletal   Abdominal   Peds  Hematology  (+) anemia ,   Anesthesia Other Findings   Reproductive/Obstetrics                             Anesthesia Physical Anesthesia Plan  ASA: III  Anesthesia Plan: General   Post-op Pain Management:    Induction: Intravenous  Airway Management Planned: LMA  Additional Equipment:   Intra-op Plan:   Post-operative Plan: Extubation in OR  Informed Consent: I have reviewed the patients History and Physical, chart, labs and discussed the procedure including the risks, benefits and alternatives for the proposed anesthesia with the patient or authorized representative who has indicated his/her understanding and acceptance.   Dental advisory given  Plan Discussed with: CRNA and Anesthesiologist  Anesthesia Plan Comments:         Anesthesia Quick Evaluation

## 2016-07-22 NOTE — Anesthesia Postprocedure Evaluation (Signed)
Anesthesia Post Note  Patient: Noah Lewis  Procedure(s) Performed: Procedure(s) (LRB): INSERTION OF ARTERIOVENOUS (AV) LOOP GRAFT LEFT THIGH (Left)  Patient location during evaluation: PACU Anesthesia Type: General Level of consciousness: awake Pain management: pain level controlled Vital Signs Assessment: post-procedure vital signs reviewed and stable Respiratory status: spontaneous breathing Cardiovascular status: stable Anesthetic complications: no    Last Vitals:  Vitals:   07/22/16 1017 07/22/16 1032  BP: 98/75 105/71  Pulse: 83 91  Resp: 16 18  Temp: 36.3 C     Last Pain:  Vitals:   07/22/16 0933  TempSrc:   PainSc: 0-No pain                 EDWARDS,[name]

## 2016-07-22 NOTE — Transfer of Care (Signed)
Immediate Anesthesia Transfer of Care Note  Patient: Noah Lewis  Procedure(s) Performed: Procedure(s): INSERTION OF ARTERIOVENOUS (AV) LOOP GRAFT LEFT THIGH (Left)  Patient Location: PACU  Anesthesia Type:General  Level of Consciousness: awake, alert  and oriented  Airway & Oxygen Therapy: Patient Spontanous Breathing and Patient connected to nasal cannula oxygen  Post-op Assessment: Report given to RN, Post -op Vital signs reviewed and stable and Patient moving all extremities X 4  Post vital signs: Reviewed and stable  Last Vitals:  Vitals:   07/22/16 0000 07/22/16 0400  BP: 118/83 (!) 148/105  Pulse: 75   Resp:    Temp: 36.9 C 37.1 C    Last Pain:  Vitals:   07/22/16 0400  TempSrc: Oral  PainSc:       Patients Stated Pain Goal: 0 ([id number])  Complications: No apparent anesthesia complications

## 2016-07-22 NOTE — Anesthesia Procedure Notes (Signed)
Procedure Name: LMA Insertion Date/Time: 07/22/2016 7:40 AM Performed by: Mariea Clonts Pre-anesthesia Checklist: Patient identified, Emergency Drugs available, Suction available and Patient being monitored Patient Re-evaluated:Patient Re-evaluated prior to inductionOxygen Delivery Method: Circle System Utilized Preoxygenation: Pre-oxygenation with 100% oxygen Intubation Type: IV induction Ventilation: Mask ventilation without difficulty LMA: LMA inserted LMA Size: 4.0 Number of attempts: 1 Airway Equipment and Method: Bite block Placement Confirmation: positive ETCO2 Tube secured with: Tape Dental Injury: Teeth and Oropharynx as per pre-operative assessment
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
## 2016-07-23 ENCOUNTER — Encounter (HOSPITAL_COMMUNITY): Payer: Self-pay | Admitting: Vascular Surgery

## 2016-07-23 DIAGNOSIS — T82898A Other specified complication of vascular prosthetic devices, implants and grafts, initial encounter: Secondary | ICD-10-CM | POA: Diagnosis not present

## 2016-07-23 DIAGNOSIS — N189 Chronic kidney disease, unspecified: Secondary | ICD-10-CM | POA: Diagnosis not present

## 2016-07-23 DIAGNOSIS — M79602 Pain in left arm: Secondary | ICD-10-CM | POA: Diagnosis not present

## 2016-07-23 DIAGNOSIS — N179 Acute kidney failure, unspecified: Secondary | ICD-10-CM | POA: Diagnosis not present

## 2016-07-23 LAB — PROTIME-INR
INR: 1.2
PROTHROMBIN TIME: 15.3 s — AB (ref 11.4–15.2)

## 2016-07-23 MED ORDER — WARFARIN SODIUM 5 MG PO TABS
5.0000 mg | ORAL_TABLET | Freq: Once | ORAL | Status: AC
  Administered 2016-07-23: 5 mg via ORAL
  Filled 2016-07-23: qty 1

## 2016-07-23 MED ORDER — CALCITRIOL 0.5 MCG PO CAPS: 0.5000 ug | ORAL_CAPSULE | ORAL | Status: DC

## 2016-07-23 MED ORDER — MIRTAZAPINE 15 MG PO TBDP: 15.0000 mg | ORAL_TABLET | Freq: Every day | ORAL | 0 refills | Status: AC

## 2016-07-23 MED ORDER — LAMOTRIGINE 25 MG PO TABS: 50.0000 mg | ORAL_TABLET | Freq: Every day | ORAL | 0 refills | Status: AC

## 2016-07-23 MED ORDER — METOPROLOL TARTRATE 25 MG PO TABS: 12.5000 mg | ORAL_TABLET | Freq: Two times a day (BID) | ORAL | Status: DC

## 2016-07-23 MED ORDER — WARFARIN SODIUM 5 MG PO TABS: 5.0000 mg | ORAL_TABLET | Freq: Once | ORAL | 0 refills | Status: DC

## 2016-07-23 NOTE — Progress Notes (Signed)
ANTICOAGULATION CONSULT NOTE - Initial Consult  Pharmacy Consult for warfarin Indication: atrial fibrillation  Allergies  Allergen Reactions  . Penicillins Other (See Comments)    Reaction:  Unknown     Patient Measurements: Height: 5\' 7"  (170.2 cm) Weight: 166 lb 0.1 oz (75.3 kg) IBW/kg (Calculated) : 66.1  Vital Signs: Temp: 98.5 F (36.9 C) (10/31 0349) Temp Source: Oral (10/31 0349) BP: 104/64 (10/31 0349) Pulse Rate: 96 (10/30 2329)  Labs:  Recent Labs  07/20/16 1100 07/20/16 1302 07/20/16 1720 07/20/16 1721 07/20/16 2105 07/22/16 0554 07/22/16 0603 07/23/16 0227  HGB  --   --  11.4*  --   --  12.0*  --   --   HCT  --   --  35.0*  --   --  36.4*  --   --   PLT  --   --  167  --   --  201  --   --   LABPROT  --   --   --   --   --  14.4  --  15.3*  INR  --   --   --   --   --  1.11  --  1.20  HEPARINUNFRC 1.88* 0.52  --   --  0.87*  --   --   --   CREATININE  --   --  5.17* 5.28*  --   --  10.21*  --     Estimated Creatinine Clearance: 6.7 mL/min (by C-G formula based on SCr of 10.21 mg/dL (H)).  Assessment: 65 y.o. male who is ESRD and has been sent to the ED secondary to LUE pain and a concern for possible infection. PTA eliquis for afib  Anticoag: Eliquis PTA for hx Aifb - held this admit d/t surgery needs for L-AVF - s/p ligation on 10/25. Post-op with bleeding from [facility] no more apixaban per renal INR 1.2  Heme/Onc: H&H 12/36.4, Plt 201  Goal of Therapy:  INR 2-3 Monitor platelets by anticoagulation protocol: Yes   Plan:  Warfarin 5 mg x 1 Daily INR, CBC q72h Monitor for s/sx of bleeding  Levester Fresh, PharmD, BCPS, [facility] Clinical Pharmacist Pager [phone] 07/23/2016 8:00 AM

## 2016-07-23 NOTE — Procedures (Signed)
Doing well, L thigh AVG in place w good bruit.  On HD now using thigh cath on R.  For dc after HD today. Lower dry wt to 81 kg.     I was present at this dialysis session, have reviewed the session itself and made  appropriate changes Kelly Splinter MD [place] pager [phone]   07/23/2016, 11:07 AM

## 2016-07-23 NOTE — Discharge Instructions (Signed)

## 2016-07-23 NOTE — Progress Notes (Addendum)
  [facility] Progress Note  Subjective  - POD #1  Pain at left thigh surgical site. Denies left foot pain.  Objective Vitals:   07/23/16 0349 07/23/16 0802  BP: 104/64 129/77  Pulse:  96  Resp:  (!) 27  Temp: 98.5 F (36.9 C) 97.9 F (36.6 C)    Intake/Output Summary (Last 24 hours) at 07/23/16 1046 Last data filed at 07/23/16 0349  Gross per 24 hour  Intake              360 ml  Output                0 ml  Net              360 ml   Left thigh with tenderness to palpation. Palpable thrill and audible bruit left thigh graft Faintly palpable left DP pulse.   Assessment/Planning: 65 y.o. male is s/p: left AV thigh graft 1 Day Post-Op   Thigh graft patent. No steal symptoms. Has right tunneled femoral catheter. Can cannulate graft  in one month. F/u in 2 weeks with Dr. Donzetta Matters.  Alvia Grove 07/23/2016 10:46 AM --  Laboratory CBC    Component Value Date/Time   WBC 9.6 07/22/2016 0554   HGB 12.0 (L) 07/22/2016 0554   HCT 36.4 (L) 07/22/2016 0554   PLT 201 07/22/2016 0554    BMET    Component Value Date/Time   NA 134 (L) 07/22/2016 0603   NA 142 06/12/2016   K 4.7 07/22/2016 0603   CL 95 (L) 07/22/2016 0603   CO2 24 07/22/2016 0603   GLUCOSE 96 07/22/2016 0603   BUN 49 (H) 07/22/2016 0603   BUN 60 (A) 06/12/2016   CREATININE 10.21 (H) 07/22/2016 0603   CALCIUM 9.3 07/22/2016 0603   GFRNONAA 5 (L) 07/22/2016 0603   GFRAA 5 (L) 07/22/2016 0603    COAG Lab Results  Component Value Date   INR 1.20 07/23/2016   INR 1.11 07/22/2016   INR 1.20 07/17/2016   No results found for: PTT  Antibiotics Anti-infectives    Start     Dose/Rate Route Frequency Ordered Stop   07/22/16 0600  vancomycin (VANCOCIN) IVPB 1000 mg/200 mL premix     1,000 mg 200 mL/hr over 60 Minutes Intravenous On call to O.R. 07/19/16 1819 07/22/16 0825   07/16/16 1609  vancomycin (VANCOCIN) 1-5 GM/200ML-% IVPB    Comments:  Shaaron Adler   : cabinet override     07/16/16 1609 07/16/16 [place], PA-C [facility] Office: [phone] Pager: [phone] 07/23/2016 10:46 AM  I have independently interviewed patient and agree with PA assessment and plan above. Has f/u scheduled 11.17. Call with questions.  Ryver Zadrozny C. Donzetta Matters, MD [facility] [place] Office: [phone] Pager: [phone]

## 2016-07-23 NOTE — Care Management Note (Signed)
Case Management Note  Patient Details  Name: Noah Lewis MRN: [id number] Date of Birth: 1950/12/14  Subjective/Objective:  Pt is from [facility] and plans to return there.  CSW following.                         Expected Discharge Plan:  [place]  In-House Referral:  Clinical Social Work  Discharge planning Services  CM Consult  Status of Service:  Completed, signed off  Girard Cooter, [place] 07/23/2016, 8:34 AM

## 2016-07-23 NOTE — Progress Notes (Signed)
Pt refusing to go to hemodialysis until he has had breakfast. Attempted to work and bargain with pt and still refuses until after he eats. HD nurse made aware

## 2016-07-23 NOTE — Progress Notes (Addendum)
Spoke with Dr Jonnie Finner concerning any restrictions with mobility for pt due to R femoral tunneled HD cath placement.          Dr Jonnie Finner states "pt has no mobility restrictions". Pt may be OOB, and can work with therapy.

## 2016-07-23 NOTE — Discharge Summary (Signed)
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 accessed with a 21 gauge micropuncture needle; the needle tip within the vein was confirmed with ultrasound image documentation. Needle exchanged over the 018 guidewire for a 3 [place] microdilator, which allowed venography, demonstrating occlusion of the right subclavian vein centrally. The micro dilator was exchanged for the transitional dilator, which allowed advancement of the [facility] wire. The tract was dilated to the site allow advancement of an angled 5 French angiographic Kumpe the catheter. Central made with the aid of an angled Glidewire stiff Glidewire to traverse the central subclavian occlusion and gain access to the SVC. During these attempts, the Kumpe the catheter was exchanged for a 4 French angled glide catheter. Ultimately, the included segments could not be traversed. This required femoral access to be used. Patency of the right common femoral vein was confirmed with ultrasound with image documentation. An appropriate skin site was determined. Region was prepped using maximum barrier technique including cap and mask, sterile gown, sterile gloves, large sterile sheet, and Chlorhexidine as cutaneous  antisepsis. The region was infiltrated locally with 1% lidocaine. Under real-time ultrasound guidance, the right common femoral vein was accessed with a 21 gauge micropuncture needle; the needle tip within the vein was confirmed with ultrasound image documentation. Needle exchanged over the 018 guidewire for transitional dilator which allowed advancement of the [facility] wire into the IVC. A 5 French angiographic catheter was placed and intravascular length was measured. A Palindrome 44 cm hemodialysis catheter was tunneled from the right proximal anterior thigh approach to the right femoral dermatotomy site. The angiographic catheter was exchanged over an Amplatz wire for serial vascular dilators which allow placement of a peel-away sheath, through which the catheter was advanced under intermittent fluoroscopy, positioned with its tips at the inferior cavoatrial junction. Spot chest radiograph confirms good catheter position. No pneumothorax. Catheter was flushed and primed per protocol. Catheter secured externally with O Prolene sutures. The right femoral dermatotomy site was closed with Dermabond. COMPLICATIONS: COMPLICATIONS None immediate FLUOROSCOPY TIME:  9 minutes, 308 uGym2 DAP COMPARISON:  06/02/2015 and previous IMPRESSION: 1. Technically successful placement of tunneled right femoral hemodialysis catheter with ultrasound and fluoroscopic guidance. Ready for routine use. 2. 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 Re- cannulization attempts to gain access to the SVC were not successful. ACCESS: There is no upper extremity central venous access. Future catheters will require femoral, trans lumbar, or other alternative access approaches. Electronically Signed   By: Lucrezia Europe M.D.   On: 07/17/2016 11:16   Dg Chest Port 1 View  Result Date: 07/17/2016 CLINICAL DATA:  Pulmonary edema, history COPD, diabetes mellitus, asthma, coronary artery disease, end-stage  renal disease on dialysis  EXAM: PORTABLE CHEST 1 VIEW COMPARISON:  Portable exam 0450 hours compared to 07/16/2016 FINDINGS: Minimal enlargement of cardiac silhouette. Mediastinal contours and pulmonary vascularity normal. Lungs now clear. No pleural effusion or pneumothorax. No significant osseous findings. IMPRESSION: No acute abnormalities. Pulmonary edema seen on previous exam improved. Electronically Signed   By: Lavonia Dana M.D.   On: 07/17/2016 07:39   Dg Chest Portable 1 View  Result Date: 07/16/2016 CLINICAL DATA:  Hypoxia EXAM: PORTABLE CHEST 1 VIEW COMPARISON:  06/13/2016 FINDINGS: Diffuse interstitial opacity with occasional Kerley lines. No pleural effusion. Chronic cardiopericardial enlargement. Left brachiocephalic stent again noted. IMPRESSION: Mild pulmonary edema. Electronically Signed   By: Monte Fantasia M.D.   On: 07/16/2016 15:48    Microbiology: Recent Results (from the past 240 hour(s))  MRSA PCR Screening     Status: None   Collection Time: 07/16/16 11:23 PM  Result Value Ref Range Status   MRSA by PCR NEGATIVE NEGATIVE Final    Comment:        The GeneXpert MRSA Assay (FDA approved for NASAL specimens only), is one component of a comprehensive MRSA colonization surveillance program. It is not intended to diagnose MRSA infection nor to guide or monitor treatment for MRSA infections.      Labs: Basic Metabolic Panel:  Recent Labs Lab 07/16/16 1417 07/17/16 0815 07/20/16 1720 07/20/16 1721 07/22/16 0603  NA 138 136 130* 130* 134*  K 5.7* 4.7 3.2* 3.2* 4.7  CL 96* 95* 92* 93* 95*  CO2 22 26 25 25 24   GLUCOSE 110* 81 191* 189* 96  BUN 82* 37* 14 14 49*  CREATININE 17.91* 11.27* 5.17* 5.28* 10.21*  CALCIUM 8.3* 8.3* 8.5* 8.6* 9.3  PHOS  --  7.1*  --  3.6 5.8*   Liver Function Tests:  Recent Labs Lab 07/16/16 1417 07/17/16 0815 07/20/16 1721 07/22/16 0603  AST 18  --   --   --   ALT 14*  --   --   --   ALKPHOS 109  --   --   --   BILITOT 0.8  --   --   --   PROT  8.1  --   --   --   ALBUMIN 3.9 3.8 3.5 3.6   No results for input(s): LIPASE, AMYLASE in the last 168 hours. No results for input(s): AMMONIA in the last 168 hours. CBC:  Recent Labs Lab 07/16/16 1417 07/17/16 0806 07/18/16 0501 07/19/16 0209 07/20/16 1720 07/22/16 0554  WBC 8.2 7.1 5.7 6.4 8.2 9.6  NEUTROABS 6.5  --  4.2  --  6.4 7.0  HGB 11.9* 11.6* 10.4* 11.6* 11.4* 12.0*  HCT 37.3* 35.6* 32.9* 37.0* 35.0* 36.4*  MCV 87.8 86.8 86.6 87.9 86.2 86.1  PLT 205 189 171 170 167 201   Cardiac Enzymes: No results for input(s): CKTOTAL, CKMB, CKMBINDEX, TROPONINI in the last 168 hours. BNP: BNP (last 3 results) No results for input(s): BNP in the last 8760 hours.  ProBNP (last 3 results) No results for input(s): PROBNP in the last 8760 hours.  CBG:  Recent Labs Lab 07/17/16 1246 07/17/16 1523 07/22/16 0936  GLUCAP 86 119* 111*       Signed:  Nita Sells MD   [facility] 07/23/2016, 8:38 AM

## 2016-07-23 NOTE — Progress Notes (Signed)
[facility] transportation just arrived to unit and picked up pt to return to [facility].  Pt d/c'd with his belongings.

## 2016-07-23 NOTE — Progress Notes (Signed)
PT Cancellation Note  Patient Details Name: Noah Lewis MRN: [id number] DOB: Sep 12, 1951   Cancelled Treatment:    Reason Eval/Treat Not Completed: Medical issues which prohibited therapy; patient continues with temporary femoral HD catheter, RN to ask MD if okay to mobilize.  Reports planned d/c back to facility today.  Will check back if not in HD.   Reginia Naas 07/23/2016, 10:41 AM  Magda Kiel, PT [phone] 07/23/2016

## 2016-07-23 NOTE — Progress Notes (Signed)
Report completed to nurse Seth Bake at [facility] at [phone].

## 2016-07-23 NOTE — Care Management Important Message (Signed)
Important Message  Patient Details  Name: Noah Lewis MRN: [id number] Date of Birth: [birth year]   Medicare Important Message Given:  Yes    Nathen May 07/23/2016, 12:36 PM

## 2016-07-23 NOTE — Progress Notes (Signed)
Patient will DC to: [name] Anticipated DC date: 07/23/16 Family notified: N/A Transport by: Corey Harold   Per MD patient ready for DC to [place]. RN, patient, and facility notified of DC. Discharge Summary sent to facility. RN given number for report. DC packet on chart. Ambulance transport requested for patient.   CSW signing off.  Cedric Fishman, [place] Social Worker [phone]

## 2016-07-24 ENCOUNTER — Non-Acute Institutional Stay (SKILLED_NURSING_FACILITY): Payer: Medicare Other | Admitting: Adult Health

## 2016-07-24 ENCOUNTER — Encounter: Payer: Self-pay | Admitting: Adult Health

## 2016-07-24 DIAGNOSIS — N186 End stage renal disease: Secondary | ICD-10-CM | POA: Diagnosis not present

## 2016-07-24 DIAGNOSIS — Z992 Dependence on renal dialysis: Secondary | ICD-10-CM

## 2016-07-24 DIAGNOSIS — I82722 Chronic embolism and thrombosis of deep veins of left upper extremity: Secondary | ICD-10-CM

## 2016-07-24 DIAGNOSIS — I120 Hypertensive chronic kidney disease with stage 5 chronic kidney disease or end stage renal disease: Secondary | ICD-10-CM

## 2016-07-24 DIAGNOSIS — F319 Bipolar disorder, unspecified: Secondary | ICD-10-CM

## 2016-07-24 DIAGNOSIS — N185 Chronic kidney disease, stage 5: Secondary | ICD-10-CM

## 2016-07-24 DIAGNOSIS — F3130 Bipolar disorder, current episode depressed, mild or moderate severity, unspecified: Secondary | ICD-10-CM

## 2016-07-24 DIAGNOSIS — T462X1A Poisoning by other antidysrhythmic drugs, accidental (unintentional), initial encounter: Secondary | ICD-10-CM | POA: Diagnosis not present

## 2016-07-24 DIAGNOSIS — D631 Anemia in chronic kidney disease: Secondary | ICD-10-CM

## 2016-07-24 DIAGNOSIS — I482 Chronic atrial fibrillation, unspecified: Secondary | ICD-10-CM

## 2016-07-24 DIAGNOSIS — E032 Hypothyroidism due to medicaments and other exogenous substances: Secondary | ICD-10-CM

## 2016-07-24 DIAGNOSIS — I5032 Chronic diastolic (congestive) heart failure: Secondary | ICD-10-CM

## 2016-07-24 DIAGNOSIS — E1122 Type 2 diabetes mellitus with diabetic chronic kidney disease: Secondary | ICD-10-CM

## 2016-07-24 NOTE — Progress Notes (Signed)
Patient ID: Noah Lewis, male   DOB: January 04, 1951, 65 y.o.   MRN: [id number]    Location:   [facility] Room Number: 215-B Place of Service:  SNF (31)   CODE STATUS: Full Code  Allergies  Allergen Reactions  . Penicillins Other (See Comments)    Reaction:  Unknown     Chief Complaint  Patient presents with  . Hospitalization Follow-up    Hospital Follow up    HPI:  He is a long term resident of this facility being seen after being hospitalized for his ESRD.  He has had a left thigh A/V fistula while in the hospital. He is not voicing any complaints there are no nursing concerns at this time.    Past Medical History:  Diagnosis Date  . Arthritis   . Asthma   . Cancer ([place])   . COPD (chronic obstructive pulmonary disease) ([place])   . Coronary artery disease   . Diabetes mellitus without complication ([place])   . Hypertension   . Renal disorder     Past Surgical History:  Procedure Laterality Date  . AV FISTULA PLACEMENT Left 07/22/2016   Procedure: INSERTION OF ARTERIOVENOUS (AV) LOOP GRAFT LEFT THIGH;  Surgeon: Waynetta Sandy, MD;  Location: [place];  Service: Vascular;  Laterality: Left;  . INSERTION OF DIALYSIS CATHETER    . INSERTION OF DIALYSIS CATHETER N/A 05/29/2015   Procedure: INSERTION OF DIALYSIS CATHETER;  Surgeon: Angelia Mould, MD;  Location: [place];  Service: Vascular;  Laterality: N/A;  . IR GENERIC HISTORICAL  07/16/2016   IR US GUIDE VASC ACCESS RIGHT 07/16/2016 Arne Cleveland, MD [facility]  . IR GENERIC HISTORICAL  07/16/2016   IR FLUORO GUIDE CV LINE RIGHT 07/16/2016 Arne Cleveland, MD [facility]  . IR GENERIC HISTORICAL  07/16/2016   IR US GUIDE VASC ACCESS RIGHT 07/16/2016 Arne Cleveland, MD [facility]  . IR GENERIC HISTORICAL  07/16/2016   IR VENO/EXT/UNI RIGHT 07/16/2016 Arne Cleveland, MD [facility]  . LIGATION OF ARTERIOVENOUS  FISTULA Left 07/17/2016   Procedure: LIGATION OF BRACHIOCEPHALIC ARTERIOVENOUS   FISTULA  Left arm;  Surgeon: Rosetta Posner, MD;  Location: [place];  Service: Vascular;  Laterality: Left;  . NO PAST SURGERIES    . PERIPHERAL VASCULAR CATHETERIZATION N/A 10/25/2015   Procedure: Dialysis/Perma Catheter Insertion;  Surgeon: Katha Cabal, MD;  Location: [facility];  Service: Cardiovascular;  Laterality: N/A;  . PERIPHERAL VASCULAR CATHETERIZATION Left 02/22/2016   Procedure: A/V Shuntogram/Fistulagram;  Surgeon: Algernon Huxley, MD;  Location: [place] CV LAB;  Service: Cardiovascular;  Laterality: Left;  . PERIPHERAL VASCULAR CATHETERIZATION Right 03/19/2016   Procedure: Dialysis/Perma Catheter Insertion;  Surgeon: Katha Cabal, MD;  Location: [place] [facility];  Service: Cardiovascular;  Laterality: Right;  . PERIPHERAL VASCULAR CATHETERIZATION N/A 04/22/2016   Procedure: Dialysis/Perma Catheter Removal;  Surgeon: Algernon Huxley, MD;  Location: [place] CV LAB;  Service: Cardiovascular;  Laterality: N/A;    Social History   Social History  . Marital status: Legally Separated    Spouse name: N/A  . Number of children: N/A  . Years of education: N/A   Occupational History  . Not on file.   Social History Main Topics  . Smoking status: Never Smoker  . Smokeless tobacco: Never Used  . Alcohol use No  . Drug use:     Types: Marijuana     Comment: Occasionally  . Sexual activity: Not on file  Other Topics Concern  . Not on file   Social History Narrative  . No narrative on file   Family History  Problem Relation Age of Onset  . Hypertension Other   . Diabetes Other       VITAL SIGNS BP 112/73   Pulse 78   Temp 97.7 F (36.5 C) (Oral)   Resp 18   Ht 5\' 7"  (1.702 m)   Wt 175 lb (79.4 kg)   SpO2 96%   BMI 27.41 kg/m   Patient's Medications  New Prescriptions   No medications on file  Previous Medications   ACETAMINOPHEN (TYLENOL) 325 MG TABLET    Take 650 mg by mouth every 6 (six) hours as needed for mild pain, fever or  headache.   AMIODARONE (PACERONE) 400 MG TABLET    Take 1 tablet (400 mg total) by mouth daily.   ASPIRIN 81 MG CHEWABLE TABLET    Chew 1 tablet (81 mg total) by mouth daily.   B COMPLEX-VITAMIN C-FOLIC ACID (NEPHRO-VITE) 0.8 MG TABS TABLET    Take 1 tablet by mouth 2 (two) times daily.   CALCITRIOL (ROCALTROL) 0.5 MCG CAPSULE    Take 1 capsule (0.5 mcg total) by mouth every other day.   CAPSAICIN (CAPSAGEL) 0.025 % GEL    Apply 1 application topically 2 (two) times daily as needed (for lower back pain).   IPRATROPIUM-ALBUTEROL (DUONEB) 0.5-2.5 (3) MG/3ML SOLN    Take 3 mLs by nebulization every 6 (six) hours as needed (for wheezing/shortness of breath).   LAMOTRIGINE (LAMICTAL) 25 MG TABLET    Take 2 tablets (50 mg total) by mouth daily.   LEVOTHYROXINE (SYNTHROID, LEVOTHROID) 75 MCG TABLET    Take 75 mcg by mouth daily before breakfast.    LORATADINE (CLARITIN) 10 MG TABLET    Take 1 tablet (10 mg total) by mouth daily.   METOPROLOL TARTRATE (LOPRESSOR) 25 MG TABLET    Take 0.5 tablets (12.5 mg total) by mouth 2 (two) times daily.   MIRTAZAPINE (REMERON SOL-TAB) 15 MG DISINTEGRATING TABLET    Take 1 tablet (15 mg total) by mouth at bedtime.   POLYETHYLENE GLYCOL (MIRALAX / GLYCOLAX) PACKET    Take 17 g by mouth daily as needed for mild constipation.    SACCHAROMYCES BOULARDII (FLORASTOR) 250 MG CAPSULE    Take 250 mg by mouth 2 (two) times daily.   SEVELAMER (RENAGEL) 800 MG TABLET    Take 2,400 mg by mouth 3 (three) times daily with meals.   TRAVOPROST, BAK FREE, (TRAVATAN) 0.004 % SOLN OPHTHALMIC SOLUTION    Place 1 drop into both eyes at bedtime.   WARFARIN (COUMADIN) 5 MG TABLET    Take 1 tablet (5 mg total) by mouth one time only at 6 PM.  Modified Medications   No medications on file  Discontinued Medications   No medications on file     SIGNIFICANT DIAGNOSTIC EXAMS   05-31-15: ct of chest: 1. 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 2. Soft tissue swelling in the LEFT  chest wall likely related to venous occlusion. There is prominent adenopathy in LEFT axilla and sub pectoralis location. Presumably this relates to venous occlusion. Cannot exclude a malignant process but is felt less likely. The adenopathy and swelling is new from 08/15/2014 3. Central venous line from a inferior approach with tip is in the RIGHT atrium and distal SVC. 4. Diffuse ground-glass opacities not changed from prior. 5. Resolution of LEFT upper lobe pulmonary nodule.  Ureteral   06-02-15: left upper arm shuntogram: Status post left upper extremity fistulagram demonstrating occluded stent in the subclavian vein at the level of the left clavicular head, as a cause for increased venous pressure within the left upper extremity.  12-05-15: 2-d echo: - Left ventricle: The cavity size was normal. There was mild concentric hypertrophy. Systolic function was normal. The estimated ejection fraction was in the range of 50% to 55%. Regional wall motion abnormalities cannot be excluded. The study is not technically sufficient to allow evaluation of LV diastolic function. - Aortic valve: There was mild regurgitation. - Mitral valve: There was mild regurgitation. - Left atrium: The atrium was mildly to moderately dilated. - Right ventricle: Systolic function was mildly reduced. - Pulmonary arteries: Systolic pressure was mild to moderately elevated. - Inferior vena cava: The vessel was normal in size.   07-16-16: chest x-ray: Mild pulmonary edema   LABS REVIEWED:   05-03-16: wbc 6.9; hgb 10.3; hct 33.2; mcv 90.7 plt 227; glucose 140; bun 98.3; creat 15.76; k+ 5.0; na++ 141; liver normal albumin 3.9; chol 101; ldl 35; trig 77; hdl 51; tsh 16.21; mag 2.4; phos 7.0  06-12-16: wbc 6.3; hgb 10.9; hct 33.6; mcv 88.4; plt 184; glucose 82; bun 59.6; creat 15.00; k+ 5.4; na++ 142; liver normal albumin 4.3 TSH 16.62 07-16-16: wbc 8.2; hgb 11.9; hct 37.3; mcv 87.8; plt 205; glucose 110; bun 82; creat 17.91; k+  5.7; na++ 138; liver normal albumin 3.9 07-22-16: wbc 9.6; hgb 12.0; hct 36.4 ;mcv 86.1; plt 201; glucose 96; bun 49;creat 10.21; k+ 4.7; na++ 134; phos 5.8; albumin 3.6     Review of Systems Constitutional: Negative for appetite change and fatigue.  Respiratory: Negative for cough, chest tightness and shortness of breath.   Cardiovascular: Negative for chest pain and leg swelling.  Gastrointestinal: Negative for abdominal pain and constipation.  Musculoskeletal: no complaint of pain  Skin: Negative for pallor.  Psychiatric/Behavioral: The patient is not nervous/anxious.     Physical Exam Vitals reviewed. Constitutional: No distress.  Overweight   Eyes: Conjunctivae are normal.  Neck: Neck supple. No JVD present. No thyromegaly present.  Cardiovascular: Normal rate, regular rhythm and intact distal pulses.   Respiratory: has scattered wheezes .  GI: Soft. Bowel sounds are normal. He exhibits no distension. There is no tenderness.  Musculoskeletal: He exhibits no edema.  Able to move all extremities   Lymphadenopathy:    He has no cervical adenopathy.  Neurological: He is alert.  Skin: Skin is warm and dry. He is not diaphoretic.  Left thigh A/V graft placed 07-22-16.   Psychiatric: He has a normal mood and affect.     ASSESSMENT/ PLAN:  1. Hypertension: will continue lopressor 12.5 mg twice daily   2. Chronic afib; is on amoidarone 400 mg daily and  lopressor 12.5 mg twice daily for rate control is on coumadin therapy 5 mg daily INR goal 2-2.5  3. COPD: will continue duoneb every 6 hours as needed and takes claritin 10 mg daily   4. Hypothyroidism: will continue synthroid 75 mcg daily tsh was 16.62; follow up labs pending  5. bipolar depression: will continue remeron 15 mg nightly and will continue lamictal 50 mg daily to stabilize mood.  6. Anemia of chronic kidney disease: hgb is 9.6 in on aranesp weekly at dialysis   7.ESRD: is on hemodialysis three times weekly;  will continue renagel 2400 mg three times daily   8. Chronic diastolic heart failure: [id number] (12-05-15)  lopressor 12.5 mg twice daily fluid volume managed at dialysis  9. OSA: is not compliant with cpap  10.  CAD: no complaint of chest pain will continue asa 81 mg daily  11. Bipolar disorder: will continue remeron 15 mg nightly lamictal 50 mg daiy to stabilize mood. 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   13. Diabetes: is currently not on mediations will check hgb a1c   Time spent with patient  50  minutes >50% time spent counseling; reviewing medical record; tests; labs; and developing future plan of care    MD is aware of resident's narcotic use and is in agreement with current plan of care. We will attempt to wean resident as appropriate.   Ok Edwards NP [facility]  Contact [phone] Monday through Friday 8am- 5pm  After hours call [phone]

## 2016-07-25 ENCOUNTER — Encounter: Payer: Self-pay | Admitting: Internal Medicine

## 2016-07-25 NOTE — Progress Notes (Signed)
Patient ID: Gurnoor Ursua, male   DOB: 10-Nov-1950, 65 y.o.   MRN: [id number]    HISTORY AND PHYSICAL   DATE: 07/25/2016  Location:    [facility] Room Number: 215 B Place of Service: SNF (31)   Extended Emergency Contact Information Primary Emergency Contact: Hayes,Barbara Address: [place], [place] [address] [place] Phone: [phone] Relation: Mother Secondary Emergency Contact: Hayes,[name] Address: [address]          [place], [place] [address] [place] Phone: [phone] Relation: Brother  Advanced Directive information Does patient have an advance directive?: Yes, Does patient want to make changes to advanced directive?: No - Patient declined  Chief Complaint  Patient presents with  . Readmit To SNF    HPI:   Past Medical History:  Diagnosis Date  . Arthritis   . Asthma   . Cancer ([facility])   . COPD (chronic obstructive pulmonary disease) ([facility])   . Coronary artery disease   . Diabetes mellitus without complication ([facility])   . Hypertension   . Renal disorder     Past Surgical History:  Procedure Laterality Date  . AV FISTULA PLACEMENT Left 07/22/2016   Procedure: INSERTION OF ARTERIOVENOUS (AV) LOOP GRAFT LEFT THIGH;  Surgeon: Waynetta Sandy, MD;  Location: [facility];  Service: Vascular;  Laterality: Left;  . INSERTION OF DIALYSIS CATHETER    . INSERTION OF DIALYSIS CATHETER N/A 05/29/2015   Procedure: INSERTION OF DIALYSIS CATHETER;  Surgeon: Angelia Mould, MD;  Location: [facility];  Service: Vascular;  Laterality: N/A;  . IR GENERIC HISTORICAL  07/16/2016   IR US GUIDE VASC ACCESS RIGHT 07/16/2016 Arne Cleveland, MD [facility]  . IR GENERIC HISTORICAL  07/16/2016   IR FLUORO GUIDE CV LINE RIGHT 07/16/2016 Arne Cleveland, MD [facility]  . IR GENERIC HISTORICAL  07/16/2016   IR US GUIDE VASC ACCESS RIGHT 07/16/2016 Arne Cleveland, MD [facility]  . IR GENERIC  HISTORICAL  07/16/2016   IR VENO/EXT/UNI RIGHT 07/16/2016 Arne Cleveland, MD [facility]  . LIGATION OF ARTERIOVENOUS  FISTULA Left 07/17/2016   Procedure: LIGATION OF BRACHIOCEPHALIC ARTERIOVENOUS  FISTULA  Left arm;  Surgeon: Rosetta Posner, MD;  Location: [facility];  Service: Vascular;  Laterality: Left;  . NO PAST SURGERIES    . PERIPHERAL VASCULAR CATHETERIZATION N/A 10/25/2015   Procedure: Dialysis/Perma Catheter Insertion;  Surgeon: Katha Cabal, MD;  Location: [facility];  Service: Cardiovascular;  Laterality: N/A;  . PERIPHERAL VASCULAR CATHETERIZATION Left 02/22/2016   Procedure: A/V Shuntogram/Fistulagram;  Surgeon: Algernon Huxley, MD;  Location: [facility];  Service: Cardiovascular;  Laterality: Left;  . PERIPHERAL VASCULAR CATHETERIZATION Right 03/19/2016   Procedure: Dialysis/Perma Catheter Insertion;  Surgeon: Katha Cabal, MD;  Location: [facility];  Service: Cardiovascular;  Laterality: Right;  . PERIPHERAL VASCULAR CATHETERIZATION N/A 04/22/2016   Procedure: Dialysis/Perma Catheter Removal;  Surgeon: Algernon Huxley, MD;  Location: [facility];  Service: Cardiovascular;  Laterality: N/A;    Patient Care Team: Theotis Burrow, MD as PCP - General (Family Medicine)  Social History   Social History  . Marital status: Legally Separated    Spouse name: N/A  . Number of children: N/A  . Years of education: N/A   Occupational History  . Not on file.   Social History Main Topics  . Smoking  status: Never Smoker  . Smokeless tobacco: Never Used  . Alcohol use No  . Drug use:     Types: Marijuana     Comment: Occasionally  . Sexual activity: Not on file   Other Topics Concern  . Not on file   Social History Narrative  . No narrative on file     reports that he has never smoked. He has never used smokeless tobacco. He reports that he uses drugs, including Marijuana. He reports that he does not drink alcohol.  Family History    Problem Relation Age of Onset  . Hypertension Other   . Diabetes Other    Family Status  Relation Status  . Mother Alive  . Father Deceased  . Other     Immunization History  Administered Date(s) Administered  . PPD Test 05/01/2016    Allergies  Allergen Reactions  . Penicillins Other (See Comments)    Reaction:  Unknown     Medications: Patient's Medications  New Prescriptions   No medications on file  Previous Medications   ACETAMINOPHEN (TYLENOL) 325 MG TABLET    Take 650 mg by mouth every 6 (six) hours as needed for mild pain, fever or headache.   AMIODARONE (PACERONE) 400 MG TABLET    Take 1 tablet (400 mg total) by mouth daily.   ASPIRIN 81 MG CHEWABLE TABLET    Chew 1 tablet (81 mg total) by mouth daily.   B COMPLEX-VITAMIN C-FOLIC ACID (NEPHRO-VITE) 0.8 MG TABS TABLET    Take 1 tablet by mouth 2 (two) times daily.   CALCITRIOL (ROCALTROL) 0.5 MCG CAPSULE    Take 1 capsule (0.5 mcg total) by mouth every other day.   CAPSAICIN (CAPSAGEL) 0.025 % GEL    Apply 1 application topically 2 (two) times daily as needed (for lower back pain).   IPRATROPIUM-ALBUTEROL (DUONEB) 0.5-2.5 (3) MG/3ML SOLN    Take 3 mLs by nebulization every 6 (six) hours as needed (for wheezing/shortness of breath).   LAMOTRIGINE (LAMICTAL) 25 MG TABLET    Take 2 tablets (50 mg total) by mouth daily.   LEVOTHYROXINE (SYNTHROID, LEVOTHROID) 75 MCG TABLET    Take 75 mcg by mouth daily before breakfast.    LORATADINE (CLARITIN) 10 MG TABLET    Take 1 tablet (10 mg total) by mouth daily.   METOPROLOL TARTRATE (LOPRESSOR) 25 MG TABLET    Take 0.5 tablets (12.5 mg total) by mouth 2 (two) times daily.   MIRTAZAPINE (REMERON SOL-TAB) 15 MG DISINTEGRATING TABLET    Take 1 tablet (15 mg total) by mouth at bedtime.   POLYETHYLENE GLYCOL (MIRALAX / GLYCOLAX) PACKET    Take 17 g by mouth daily as needed for mild constipation.    SACCHAROMYCES BOULARDII (FLORASTOR) 250 MG CAPSULE    Take 250 mg by mouth 2 (two)  times daily.   SEVELAMER (RENAGEL) 800 MG TABLET    Take 2,400 mg by mouth 3 (three) times daily with meals.   TRAVOPROST, BAK FREE, (TRAVATAN) 0.004 % SOLN OPHTHALMIC SOLUTION    Place 1 drop into both eyes at bedtime.   WARFARIN (COUMADIN) 5 MG TABLET    Take 1 tablet (5 mg total) by mouth one time only at 6 PM.  Modified Medications   No medications on file  Discontinued Medications   No medications on file    Review of Systems  Vitals:   07/25/16 1121  BP: 112/73  Pulse: 78  Resp: 18  Temp: 97.7 F (36.5 C)  TempSrc: Oral  SpO2: 96%  Weight: 175 lb (79.4 kg)  Height: '5\' 7"'$  (1.702 m)   Body mass index is 27.41 kg/m.  Physical Exam   Labs reviewed: Admission on 07/16/2016, Discharged on 07/23/2016  Component Date Value Ref Range Status  . Sodium 07/16/2016 138  135 - 145 mmol/L Final  . Potassium 07/16/2016 5.7* 3.5 - 5.1 mmol/L Final  . Chloride 07/16/2016 96* 101 - 111 mmol/L Final  . CO2 07/16/2016 22  22 - 32 mmol/L Final  . Glucose, Bld 07/16/2016 110* 65 - 99 mg/dL Final  . BUN 07/16/2016 82* 6 - 20 mg/dL Final  . Creatinine, Ser 07/16/2016 17.91* 0.61 - 1.24 mg/dL Final  . Calcium 07/16/2016 8.3* 8.9 - 10.3 mg/dL Final  . Total Protein 07/16/2016 8.1  6.5 - 8.1 g/dL Final  . Albumin 07/16/2016 3.9  3.5 - 5.0 g/dL Final  . AST 07/16/2016 18  15 - 41 U/L Final  . ALT 07/16/2016 14* 17 - 63 U/L Final  . Alkaline Phosphatase 07/16/2016 109  38 - 126 U/L Final  . Total Bilirubin 07/16/2016 0.8  0.3 - 1.2 mg/dL Final  . GFR calc non Af Amer 07/16/2016 2* >60 mL/min Final  . GFR calc Af Amer 07/16/2016 3* >60 mL/min Final   Comment: (NOTE) The eGFR has been calculated using the CKD EPI equation. This calculation has not been validated in all clinical situations. eGFR's persistently <60 mL/min signify possible Chronic Kidney Disease.   . Anion gap 07/16/2016 20* 5 - 15 Final  . Lactic Acid, Venous 07/16/2016 2.23* 0.5 - 1.9 mmol/L Final  . Comment 07/16/2016  NOTIFIED PHYSICIAN   Final  . WBC 07/16/2016 8.2  4.0 - 10.5 K/uL Final  . RBC 07/16/2016 4.25  4.22 - 5.81 MIL/uL Final  . Hemoglobin 07/16/2016 11.9* 13.0 - 17.0 g/dL Final  . HCT 07/16/2016 37.3* 39.0 - 52.0 % Final  . MCV 07/16/2016 87.8  78.0 - 100.0 fL Final  . MCH 07/16/2016 28.0  26.0 - 34.0 pg Final  . MCHC 07/16/2016 31.9  30.0 - 36.0 g/dL Final  . RDW 07/16/2016 17.5* 11.5 - 15.5 % Final  . Platelets 07/16/2016 205  150 - 400 K/uL Final  . Neutrophils Relative % 07/16/2016 80  % Final  . Neutro Abs 07/16/2016 6.5  1.7 - 7.7 K/uL Final  . Lymphocytes Relative 07/16/2016 12  % Final  . Lymphs Abs 07/16/2016 1.0  0.7 - 4.0 K/uL Final  . Monocytes Relative 07/16/2016 6  % Final  . Monocytes Absolute 07/16/2016 0.5  0.1 - 1.0 K/uL Final  . Eosinophils Relative 07/16/2016 2  % Final  . Eosinophils Absolute 07/16/2016 0.1  0.0 - 0.7 K/uL Final  . Basophils Relative 07/16/2016 0  % Final  . Basophils Absolute 07/16/2016 0.0  0.0 - 0.1 K/uL Final  . Procalcitonin 07/17/2016 0.29  ng/mL Final   Comment:        Interpretation: PCT (Procalcitonin) <= 0.5 ng/mL: Systemic infection (sepsis) is not likely. Local bacterial infection is possible. (NOTE)         ICU PCT Algorithm               Non ICU PCT Algorithm    ----------------------------     ------------------------------         PCT < 0.25 ng/mL                 PCT < 0.1 ng/mL     Stopping of  antibiotics            Stopping of antibiotics       strongly encouraged.               strongly encouraged.    ----------------------------     ------------------------------       PCT level decrease by               PCT < 0.25 ng/mL       >= 80% from peak PCT       OR PCT 0.25 - 0.5 ng/mL          Stopping of antibiotics                                             encouraged.     Stopping of antibiotics           encouraged.    ----------------------------     ------------------------------       PCT level decrease by               PCT >= 0.25 ng/mL       < 80% from peak PCT        AND PCT >= 0.5 ng/mL            Continuin                          g antibiotics                                              encouraged.       Continuing antibiotics            encouraged.    ----------------------------     ------------------------------     PCT level increase compared          PCT > 0.5 ng/mL         with peak PCT AND          PCT >= 0.5 ng/mL             Escalation of antibiotics                                          strongly encouraged.      Escalation of antibiotics        strongly encouraged.   Marland Kitchen MRSA by PCR 07/17/2016 NEGATIVE  NEGATIVE Final   Comment:        The GeneXpert MRSA Assay (FDA approved for NASAL specimens only), is one component of a comprehensive MRSA colonization surveillance program. It is not intended to diagnose MRSA infection nor to guide or monitor treatment for MRSA infections.   . Heparin Unfractionated 07/17/2016 0.16* 0.30 - 0.70 IU/mL Final   Comment:        IF HEPARIN RESULTS ARE BELOW EXPECTED VALUES, AND PATIENT DOSAGE HAS BEEN CONFIRMED, SUGGEST FOLLOW UP TESTING OF ANTITHROMBIN III LEVELS.   . WBC 07/17/2016 7.1  4.0 - 10.5 K/uL Final  . RBC 07/17/2016 4.10* 4.22 - 5.81 MIL/uL Final  . Hemoglobin 07/17/2016 11.6* 13.0 - 17.0  g/dL Final  . HCT 07/17/2016 35.6* 39.0 - 52.0 % Final  . MCV 07/17/2016 86.8  78.0 - 100.0 fL Final  . MCH 07/17/2016 28.3  26.0 - 34.0 pg Final  . MCHC 07/17/2016 32.6  30.0 - 36.0 g/dL Final  . RDW 07/17/2016 17.2* 11.5 - 15.5 % Final  . Platelets 07/17/2016 189  150 - 400 K/uL Final  . aPTT 07/17/2016 38* 24 - 36 seconds Final   Comment:        IF BASELINE aPTT IS ELEVATED, SUGGEST PATIENT RISK ASSESSMENT BE USED TO DETERMINE APPROPRIATE ANTICOAGULANT THERAPY.   . Sodium 07/17/2016 136  135 - 145 mmol/L Final  . Potassium 07/17/2016 4.7  3.5 - 5.1 mmol/L Final  . Chloride 07/17/2016 95* 101 - 111 mmol/L Final  . CO2 07/17/2016 26   22 - 32 mmol/L Final  . Glucose, Bld 07/17/2016 81  65 - 99 mg/dL Final  . BUN 07/17/2016 37* 6 - 20 mg/dL Final  . Creatinine, Ser 07/17/2016 11.27* 0.61 - 1.24 mg/dL Final  . Calcium 07/17/2016 8.3* 8.9 - 10.3 mg/dL Final  . Phosphorus 07/17/2016 7.1* 2.5 - 4.6 mg/dL Final  . Albumin 07/17/2016 3.8  3.5 - 5.0 g/dL Final  . GFR calc non Af Amer 07/17/2016 4* >60 mL/min Final  . GFR calc Af Amer 07/17/2016 5* >60 mL/min Final   Comment: (NOTE) The eGFR has been calculated using the CKD EPI equation. This calculation has not been validated in all clinical situations. eGFR's persistently <60 mL/min signify possible Chronic Kidney Disease.   . Anion gap 07/17/2016 15  5 - 15 Final  . Prothrombin Time 07/17/2016 15.3* 11.4 - 15.2 seconds Final  . INR 07/17/2016 1.20   Final  . Glucose-Capillary 07/17/2016 86  65 - 99 mg/dL Final  . Glucose-Capillary 07/17/2016 119* 65 - 99 mg/dL Final  . Procalcitonin 07/18/2016 0.75  ng/mL Final   Comment:        Interpretation: PCT > 0.5 ng/mL and <= 2 ng/mL: Systemic infection (sepsis) is possible, but other conditions are known to elevate PCT as well. (NOTE)         ICU PCT Algorithm               Non ICU PCT Algorithm    ----------------------------     ------------------------------         PCT < 0.25 ng/mL                 PCT < 0.1 ng/mL     Stopping of antibiotics            Stopping of antibiotics       strongly encouraged.               strongly encouraged.    ----------------------------     ------------------------------       PCT level decrease by               PCT < 0.25 ng/mL       >= 80% from peak PCT       OR PCT 0.25 - 0.5 ng/mL          Stopping of antibiotics                                             encouraged.     Stopping of  antibiotics           encouraged.    ----------------------------     ------------------------------       PCT level decrease by              PCT >= 0.25 ng/mL       < 80% from peak PCT        AND  PCT >= 0.5 ng/mL                                      Continuing antibiotics                                              encouraged.       Continuing antibiotics            encouraged.    ----------------------------     ------------------------------     PCT level increase compared          PCT > 0.5 ng/mL         with peak PCT AND          PCT >= 0.5 ng/mL             Escalation of antibiotics                                          strongly encouraged.      Escalation of antibiotics        strongly encouraged.   . Heparin Unfractionated 07/18/2016 <0.10* 0.30 - 0.70 IU/mL Final   Comment:        IF HEPARIN RESULTS ARE BELOW EXPECTED VALUES, AND PATIENT DOSAGE HAS BEEN CONFIRMED, SUGGEST FOLLOW UP TESTING OF ANTITHROMBIN III LEVELS. REPEATED TO VERIFY   . aPTT 07/18/2016 29  24 - 36 seconds Final  . WBC 07/18/2016 5.7  4.0 - 10.5 K/uL Final  . RBC 07/18/2016 3.80* 4.22 - 5.81 MIL/uL Final  . Hemoglobin 07/18/2016 10.4* 13.0 - 17.0 g/dL Final  . HCT 07/18/2016 32.9* 39.0 - 52.0 % Final  . MCV 07/18/2016 86.6  78.0 - 100.0 fL Final  . MCH 07/18/2016 27.4  26.0 - 34.0 pg Final  . MCHC 07/18/2016 31.6  30.0 - 36.0 g/dL Final  . RDW 07/18/2016 17.2* 11.5 - 15.5 % Final  . Platelets 07/18/2016 171  150 - 400 K/uL Final  . Neutrophils Relative % 07/18/2016 73  % Final  . Neutro Abs 07/18/2016 4.2  1.7 - 7.7 K/uL Final  . Lymphocytes Relative 07/18/2016 8  % Final  . Lymphs Abs 07/18/2016 0.5* 0.7 - 4.0 K/uL Final  . Monocytes Relative 07/18/2016 13  % Final  . Monocytes Absolute 07/18/2016 0.7  0.1 - 1.0 K/uL Final  . Eosinophils Relative 07/18/2016 6  % Final  . Eosinophils Absolute 07/18/2016 0.3  0.0 - 0.7 K/uL Final  . Basophils Relative 07/18/2016 0  % Final  . Basophils Absolute 07/18/2016 0.0  0.0 - 0.1 K/uL Final  . Heparin Unfractionated 07/19/2016 <0.10* 0.30 - 0.70 IU/mL Final   Comment:        IF HEPARIN RESULTS ARE BELOW EXPECTED VALUES, AND PATIENT DOSAGE HAS  BEEN CONFIRMED,  SUGGEST FOLLOW UP TESTING OF ANTITHROMBIN III LEVELS. REPEATED TO VERIFY   . WBC 07/19/2016 6.4  4.0 - 10.5 K/uL Final  . RBC 07/19/2016 4.21* 4.22 - 5.81 MIL/uL Final  . Hemoglobin 07/19/2016 11.6* 13.0 - 17.0 g/dL Final  . HCT 07/19/2016 37.0* 39.0 - 52.0 % Final  . MCV 07/19/2016 87.9  78.0 - 100.0 fL Final  . MCH 07/19/2016 27.6  26.0 - 34.0 pg Final  . MCHC 07/19/2016 31.4  30.0 - 36.0 g/dL Final  . RDW 07/19/2016 17.0* 11.5 - 15.5 % Final  . Platelets 07/19/2016 170  150 - 400 K/uL Final  . aPTT 07/19/2016 30  24 - 36 seconds Final  . Procalcitonin 07/20/2016 0.55  ng/mL Final   Comment:        Interpretation: PCT > 0.5 ng/mL and <= 2 ng/mL: Systemic infection (sepsis) is possible, but other conditions are known to elevate PCT as well. (NOTE)         ICU PCT Algorithm               Non ICU PCT Algorithm    ----------------------------     ------------------------------         PCT < 0.25 ng/mL                 PCT < 0.1 ng/mL     Stopping of antibiotics            Stopping of antibiotics       strongly encouraged.               strongly encouraged.    ----------------------------     ------------------------------       PCT level decrease by               PCT < 0.25 ng/mL       >= 80% from peak PCT       OR PCT 0.25 - 0.5 ng/mL          Stopping of antibiotics                                             encouraged.     Stopping of antibiotics           encouraged.    ----------------------------     ------------------------------       PCT level decrease by              PCT >= 0.25 ng/mL       < 80% from peak PCT        AND PCT >= 0.5 ng/mL                                      Continuing antibiotics                                              encouraged.       Continuing antibiotics            encouraged.    ----------------------------     ------------------------------     PCT level increase compared  PCT > 0.5 ng/mL         with peak PCT  AND          PCT >= 0.5 ng/mL             Escalation of antibiotics                                          strongly encouraged.      Escalation of antibiotics        strongly encouraged.   . WBC 07/20/2016 8.2  4.0 - 10.5 K/uL Final  . RBC 07/20/2016 4.06* 4.22 - 5.81 MIL/uL Final  . Hemoglobin 07/20/2016 11.4* 13.0 - 17.0 g/dL Final  . HCT 07/20/2016 35.0* 39.0 - 52.0 % Final  . MCV 07/20/2016 86.2  78.0 - 100.0 fL Final  . MCH 07/20/2016 28.1  26.0 - 34.0 pg Final  . MCHC 07/20/2016 32.6  30.0 - 36.0 g/dL Final  . RDW 07/20/2016 16.6* 11.5 - 15.5 % Final  . Platelets 07/20/2016 167  150 - 400 K/uL Final  . Neutrophils Relative % 07/20/2016 78  % Final  . Neutro Abs 07/20/2016 6.4  1.7 - 7.7 K/uL Final  . Lymphocytes Relative 07/20/2016 6  % Final  . Lymphs Abs 07/20/2016 0.5* 0.7 - 4.0 K/uL Final  . Monocytes Relative 07/20/2016 10  % Final  . Monocytes Absolute 07/20/2016 0.8  0.1 - 1.0 K/uL Final  . Eosinophils Relative 07/20/2016 6  % Final  . Eosinophils Absolute 07/20/2016 0.5  0.0 - 0.7 K/uL Final  . Basophils Relative 07/20/2016 0  % Final  . Basophils Absolute 07/20/2016 0.0  0.0 - 0.1 K/uL Final  . Sodium 07/20/2016 130* 135 - 145 mmol/L Final  . Potassium 07/20/2016 3.2* 3.5 - 5.1 mmol/L Final  . Chloride 07/20/2016 92* 101 - 111 mmol/L Final  . CO2 07/20/2016 25  22 - 32 mmol/L Final  . Glucose, Bld 07/20/2016 191* 65 - 99 mg/dL Final  . BUN 07/20/2016 14  6 - 20 mg/dL Final  . Creatinine, Ser 07/20/2016 5.17* 0.61 - 1.24 mg/dL Final  . Calcium 07/20/2016 8.5* 8.9 - 10.3 mg/dL Final  . GFR calc non Af Amer 07/20/2016 11* >60 mL/min Final  . GFR calc Af Amer 07/20/2016 12* >60 mL/min Final   Comment: (NOTE) The eGFR has been calculated using the CKD EPI equation. This calculation has not been validated in all clinical situations. eGFR's persistently <60 mL/min signify possible Chronic Kidney Disease.   . Anion gap 07/20/2016 13  5 - 15 Final  . Heparin  Unfractionated 07/19/2016 0.27* 0.30 - 0.70 IU/mL Final   Comment:        IF HEPARIN RESULTS ARE BELOW EXPECTED VALUES, AND PATIENT DOSAGE HAS BEEN CONFIRMED, SUGGEST FOLLOW UP TESTING OF ANTITHROMBIN III LEVELS.   Marland Kitchen Heparin Unfractionated 07/20/2016 1.88* 0.30 - 0.70 IU/mL Final   Comment:        IF HEPARIN RESULTS ARE BELOW EXPECTED VALUES, AND PATIENT DOSAGE HAS BEEN CONFIRMED, SUGGEST FOLLOW UP TESTING OF ANTITHROMBIN III LEVELS. RESULTS CONFIRMED BY MANUAL DILUTION   . Sodium 07/20/2016 130* 135 - 145 mmol/L Final  . Potassium 07/20/2016 3.2* 3.5 - 5.1 mmol/L Final  . Chloride 07/20/2016 93* 101 - 111 mmol/L Final  . CO2 07/20/2016 25  22 - 32 mmol/L Final  . Glucose, Bld 07/20/2016 189* 65 - 99  mg/dL Final  . BUN 07/20/2016 14  6 - 20 mg/dL Final  . Creatinine, Ser 07/20/2016 5.28* 0.61 - 1.24 mg/dL Final  . Calcium 07/20/2016 8.6* 8.9 - 10.3 mg/dL Final  . Phosphorus 07/20/2016 3.6  2.5 - 4.6 mg/dL Final  . Albumin 07/20/2016 3.5  3.5 - 5.0 g/dL Final  . GFR calc non Af Amer 07/20/2016 10* >60 mL/min Final  . GFR calc Af Amer 07/20/2016 12* >60 mL/min Final   Comment: (NOTE) The eGFR has been calculated using the CKD EPI equation. This calculation has not been validated in all clinical situations. eGFR's persistently <60 mL/min signify possible Chronic Kidney Disease.   . Anion gap 07/20/2016 12  5 - 15 Final  . Heparin Unfractionated 07/20/2016 0.52  0.30 - 0.70 IU/mL Final   Comment:        IF HEPARIN RESULTS ARE BELOW EXPECTED VALUES, AND PATIENT DOSAGE HAS BEEN CONFIRMED, SUGGEST FOLLOW UP TESTING OF ANTITHROMBIN III LEVELS.   Marland Kitchen Heparin Unfractionated 07/20/2016 0.87* 0.30 - 0.70 IU/mL Final   Comment:        IF HEPARIN RESULTS ARE BELOW EXPECTED VALUES, AND PATIENT DOSAGE HAS BEEN CONFIRMED, SUGGEST FOLLOW UP TESTING OF ANTITHROMBIN III LEVELS.   Marland Kitchen Prothrombin Time 07/22/2016 14.4  11.4 - 15.2 seconds Final  . INR 07/22/2016 1.11   Final  . Sodium  07/22/2016 134* 135 - 145 mmol/L Final  . Potassium 07/22/2016 4.7  3.5 - 5.1 mmol/L Final  . Chloride 07/22/2016 95* 101 - 111 mmol/L Final  . CO2 07/22/2016 24  22 - 32 mmol/L Final  . Glucose, Bld 07/22/2016 96  65 - 99 mg/dL Final  . BUN 07/22/2016 49* 6 - 20 mg/dL Final  . Creatinine, Ser 07/22/2016 10.21* 0.61 - 1.24 mg/dL Final  . Calcium 07/22/2016 9.3  8.9 - 10.3 mg/dL Final  . Phosphorus 07/22/2016 5.8* 2.5 - 4.6 mg/dL Final  . Albumin 07/22/2016 3.6  3.5 - 5.0 g/dL Final  . GFR calc non Af Amer 07/22/2016 5* >60 mL/min Final  . GFR calc Af Amer 07/22/2016 5* >60 mL/min Final   Comment: (NOTE) The eGFR has been calculated using the CKD EPI equation. This calculation has not been validated in all clinical situations. eGFR's persistently <60 mL/min signify possible Chronic Kidney Disease.   . Anion gap 07/22/2016 15  5 - 15 Final  . WBC 07/22/2016 9.6  4.0 - 10.5 K/uL Final  . RBC 07/22/2016 4.23  4.22 - 5.81 MIL/uL Final  . Hemoglobin 07/22/2016 12.0* 13.0 - 17.0 g/dL Final  . HCT 07/22/2016 36.4* 39.0 - 52.0 % Final  . MCV 07/22/2016 86.1  78.0 - 100.0 fL Final  . MCH 07/22/2016 28.4  26.0 - 34.0 pg Final  . MCHC 07/22/2016 33.0  30.0 - 36.0 g/dL Final  . RDW 07/22/2016 16.9* 11.5 - 15.5 % Final  . Platelets 07/22/2016 201  150 - 400 K/uL Final  . Neutrophils Relative % 07/22/2016 73  % Final  . Neutro Abs 07/22/2016 7.0  1.7 - 7.7 K/uL Final  . Lymphocytes Relative 07/22/2016 12  % Final  . Lymphs Abs 07/22/2016 1.1  0.7 - 4.0 K/uL Final  . Monocytes Relative 07/22/2016 9  % Final  . Monocytes Absolute 07/22/2016 0.9  0.1 - 1.0 K/uL Final  . Eosinophils Relative 07/22/2016 6  % Final  . Eosinophils Absolute 07/22/2016 0.6  0.0 - 0.7 K/uL Final  . Basophils Relative 07/22/2016 0  % Final  . Basophils  Absolute 07/22/2016 0.0  0.0 - 0.1 K/uL Final  . Glucose-Capillary 07/22/2016 111* 65 - 99 mg/dL Final  . Comment 1 07/22/2016 Notify RN   Final  . Comment 2 07/22/2016  Document in Chart   Final  . Prothrombin Time 07/23/2016 15.3* 11.4 - 15.2 seconds Final  . INR 07/23/2016 1.20   Final  Nursing Home on 06/20/2016  Component Date Value Ref Range Status  . Hemoglobin 06/12/2016 10.9* 13.5 - 17.5 g/dL Final  . HCT 06/12/2016 34* 41 - 53 % Final  . Platelets 06/12/2016 184  150 - 399 K/L Final  . WBC 06/12/2016 6.3  10^3/mL Final  . Glucose 06/12/2016 82  mg/dL Final  . BUN 06/12/2016 60* 4 - 21 mg/dL Final  . Creatinine 06/12/2016 15.0* 0.6 - 1.3 mg/dL Final  . Potassium 06/12/2016 5.4* 3.4 - 5.3 mmol/L Final  . Sodium 06/12/2016 142  137 - 147 mmol/L Final  . Alkaline Phosphatase 06/12/2016 113  25 - 125 U/L Final  . ALT 06/12/2016 16  10 - 40 U/L Final  . AST 06/12/2016 15  14 - 40 U/L Final  . Bilirubin, Total 06/12/2016 0.6  mg/dL Final  . TSH 06/12/2016 16.62* 0.41 - 5.90 uIU/mL Final  Admission on 06/13/2016, Discharged on 06/13/2016  Component Date Value Ref Range Status  . Sodium 06/13/2016 138  135 - 145 mmol/L Final  . Potassium 06/13/2016 5.8* 3.5 - 5.1 mmol/L Final  . Chloride 06/13/2016 99* 101 - 111 mmol/L Final  . BUN 06/13/2016 84* 6 - 20 mg/dL Final  . Creatinine, Ser 06/13/2016 >18.00* 0.61 - 1.24 mg/dL Final  . Glucose, Bld 06/13/2016 83  65 - 99 mg/dL Final  . Calcium, Ion 06/13/2016 0.74* 1.15 - 1.40 mmol/L Final  . TCO2 06/13/2016 27  0 - 100 mmol/L Final  . Hemoglobin 06/13/2016 12.2* 13.0 - 17.0 g/dL Final  . HCT 06/13/2016 36.0* 39.0 - 52.0 % Final  . Comment 06/13/2016 NOTIFIED PHYSICIAN   Final  Admission on 05/27/2016, Discharged on 05/27/2016  Component Date Value Ref Range Status  . Sodium 05/27/2016 139  135 - 145 mmol/L Final  . Potassium 05/27/2016 4.6  3.5 - 5.1 mmol/L Final  . Chloride 05/27/2016 99* 101 - 111 mmol/L Final  . BUN 05/27/2016 58* 6 - 20 mg/dL Final  . Creatinine, Ser 05/27/2016 16.50* 0.61 - 1.24 mg/dL Final  . Glucose, Bld 05/27/2016 85  65 - 99 mg/dL Final  . Calcium, Ion 05/27/2016 0.84*  1.15 - 1.40 mmol/L Final  . TCO2 05/27/2016 27  0 - 100 mmol/L Final  . Hemoglobin 05/27/2016 11.9* 13.0 - 17.0 g/dL Final  . HCT 05/27/2016 35.0* 39.0 - 52.0 % Final  Nursing Home on 05/20/2016  Component Date Value Ref Range Status  . Hemoglobin 05/03/2016 10.3* 13.5 - 17.5 g/dL Final  . HCT 05/03/2016 33* 41 - 53 % Final  . Platelets 05/03/2016 227  150 - 399 K/L Final  . WBC 05/03/2016 6.9  10^3/mL Final  . Glucose 05/03/2016 140  mg/dL Final  . BUN 05/03/2016 98* 4 - 21 mg/dL Final  . Creatinine 05/03/2016 15.8* 0.6 - 1.3 mg/dL Final  . Potassium 05/03/2016 5.0  3.4 - 5.3 mmol/L Final  . Sodium 05/03/2016 141  137 - 147 mmol/L Final  . Triglycerides 05/03/2016 77  40 - 160 mg/dL Final  . Cholesterol 05/03/2016 101  0 - 200 mg/dL Final  . HDL 05/03/2016 51  35 - 70 mg/dL Final  .  LDL Cholesterol 05/03/2016 35  mg/dL Final  . TSH 05/03/2016 16.21* 0.41 - 5.90 uIU/mL Final    Ir Veno/ext/uni Right  Result Date: 07/17/2016 CLINICAL DATA:  End-stage renal disease on hemodialysis. Planned fistula ligation. Intermediate to long-term venous access needed for hemodialysis. History of multiple venous occlusions. EXAM: 1. RIGHT UPPER EXTREMITY VENOGRAM 2. TUNNELED HEMODIALYSIS CATHETER PLACEMENT WITH ULTRASOUND AND FLUOROSCOPIC GUIDANCE 3. ULTRASOUND GUIDANCE FOR VASCULAR ACCESS TECHNIQUE: The procedure, risks, benefits, and alternatives were explained to the patient. Questions regarding the procedure were encouraged and answered. The patient understands and consents to the procedure. As antibiotic prophylaxis, vancomycin 1 g was ordered pre-procedure and administered intravenously within one hour of incision. Review of previous imaging revealed long segment occlusion of the left innominate vein. A patent right IJ vein could not be identified on survey ultrasound. The right external jugular vein was identified, which had been shown to give access to the SVC on the previous CT from 2016. Patency  of the right external jugular vein was confirmed with ultrasound with image documentation. An appropriate skin site was determined. Region was prepped using maximum barrier technique including cap and mask, sterile gown, sterile gloves, large sterile sheet, and Chlorhexidine as cutaneous antisepsis. The region was infiltrated locally with 1% lidocaine. Under real-time ultrasound guidance, the right external jugular vein was accessed with a 21 gauge micropuncture needle; the needle tip within the vein was confirmed with ultrasound image documentation. Needle exchanged over the 018 guidewire for a 3 [place] microdilator, which allowed venography, demonstrating occlusion of the right UPJ vein centrally. There is collateral reconstitution of the right axillary and subclavian vein. The subclavian vein tapers and occludes centrally. Body wall collateral strain the right upper extremity. Because of the tapered appearance of the right subclavian vein, recanalization was attempted. Under real-time ultrasound guidance, the right subclavian vein was accessed with a 21 gauge micropuncture needle; the needle tip within the vein was confirmed with ultrasound image documentation. Needle exchanged over the 018 guidewire for a 3 [place] microdilator, which allowed venography, demonstrating occlusion of the right subclavian vein centrally. The micro dilator was exchanged for the transitional dilator, which allowed advancement of the [facility] wire. The tract was dilated to the site allow advancement of an angled 5 French angiographic Kumpe the catheter. Central made with the aid of an angled Glidewire stiff Glidewire to traverse the central subclavian occlusion and gain access to the SVC. During these attempts, the Kumpe the catheter was exchanged for a 4 French angled glide catheter. Ultimately, the included segments could not be traversed. This required femoral access to be used. Patency of the right common femoral vein was confirmed  with ultrasound with image documentation. An appropriate skin site was determined. Region was prepped using maximum barrier technique including cap and mask, sterile gown, sterile gloves, large sterile sheet, and Chlorhexidine as cutaneous antisepsis. The region was infiltrated locally with 1% lidocaine. Under real-time ultrasound guidance, the right common femoral vein was accessed with a 21 gauge micropuncture needle; the needle tip within the vein was confirmed with ultrasound image documentation. Needle exchanged over the 018 guidewire for transitional dilator which allowed advancement of the [facility] wire into the IVC. A 5 French angiographic catheter was placed and intravascular length was measured. A Palindrome 44 cm hemodialysis catheter was tunneled from the right proximal anterior thigh approach to the right femoral dermatotomy site. The angiographic catheter was exchanged over an Amplatz wire for serial vascular dilators which allow placement of a peel-away sheath, through  which the catheter was advanced under intermittent fluoroscopy, positioned with its tips at the inferior cavoatrial junction. Spot chest radiograph confirms good catheter position. No pneumothorax. Catheter was flushed and primed per protocol. Catheter secured externally with O Prolene sutures. The right femoral dermatotomy site was closed with Dermabond. COMPLICATIONS: COMPLICATIONS None immediate FLUOROSCOPY TIME:  9 minutes, 308 uGym2 DAP COMPARISON:  06/02/2015 and previous IMPRESSION: 1. Technically successful placement of tunneled right femoral hemodialysis catheter with ultrasound and fluoroscopic guidance. Ready for routine use. 2. 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 Re- cannulization attempts to gain access to the SVC were not successful. ACCESS: There is no upper extremity central venous access. Future catheters will require femoral, trans lumbar, or other alternative access approaches. Electronically  Signed   By: Lucrezia Europe M.D.   On: 07/17/2016 11:16   Ir Fluoro Guide Cv Line Right  Result Date: 07/17/2016 CLINICAL DATA:  End-stage renal disease on hemodialysis. Planned fistula ligation. Intermediate to long-term venous access needed for hemodialysis. History of multiple venous occlusions. EXAM: 1. RIGHT UPPER EXTREMITY VENOGRAM 2. TUNNELED HEMODIALYSIS CATHETER PLACEMENT WITH ULTRASOUND AND FLUOROSCOPIC GUIDANCE 3. ULTRASOUND GUIDANCE FOR VASCULAR ACCESS TECHNIQUE: The procedure, risks, benefits, and alternatives were explained to the patient. Questions regarding the procedure were encouraged and answered. The patient understands and consents to the procedure. As antibiotic prophylaxis, vancomycin 1 g was ordered pre-procedure and administered intravenously within one hour of incision. Review of previous imaging revealed long segment occlusion of the left innominate vein. A patent right IJ vein could not be identified on survey ultrasound. The right external jugular vein was identified, which had been shown to give access to the SVC on the previous CT from 2016. Patency of the right external jugular vein was confirmed with ultrasound with image documentation. An appropriate skin site was determined. Region was prepped using maximum barrier technique including cap and mask, sterile gown, sterile gloves, large sterile sheet, and Chlorhexidine as cutaneous antisepsis. The region was infiltrated locally with 1% lidocaine. Under real-time ultrasound guidance, the right external jugular vein was accessed with a 21 gauge micropuncture needle; the needle tip within the vein was confirmed with ultrasound image documentation. Needle exchanged over the 018 guidewire for a 3 [place] microdilator, which allowed venography, demonstrating occlusion of the right UPJ vein centrally. There is collateral reconstitution of the right axillary and subclavian vein. The subclavian vein tapers and occludes centrally. Body wall  collateral strain the right upper extremity. Because of the tapered appearance of the right subclavian vein, recanalization was attempted. Under real-time ultrasound guidance, the right subclavian vein was accessed with a 21 gauge micropuncture needle; the needle tip within the vein was confirmed with ultrasound image documentation. Needle exchanged over the 018 guidewire for a 3 [place] microdilator, which allowed venography, demonstrating occlusion of the right subclavian vein centrally. The micro dilator was exchanged for the transitional dilator, which allowed advancement of the [facility] wire. The tract was dilated to the site allow advancement of an angled 5 French angiographic Kumpe the catheter. Central made with the aid of an angled Glidewire stiff Glidewire to traverse the central subclavian occlusion and gain access to the SVC. During these attempts, the Kumpe the catheter was exchanged for a 4 French angled glide catheter. Ultimately, the included segments could not be traversed. This required femoral access to be used. Patency of the right common femoral vein was confirmed with ultrasound with image documentation. An appropriate skin site was determined. Region was prepped using maximum barrier  technique including cap and mask, sterile gown, sterile gloves, large sterile sheet, and Chlorhexidine as cutaneous antisepsis. The region was infiltrated locally with 1% lidocaine. Under real-time ultrasound guidance, the right common femoral vein was accessed with a 21 gauge micropuncture needle; the needle tip within the vein was confirmed with ultrasound image documentation. Needle exchanged over the 018 guidewire for transitional dilator which allowed advancement of the [facility] wire into the IVC. A 5 French angiographic catheter was placed and intravascular length was measured. A Palindrome 44 cm hemodialysis catheter was tunneled from the right proximal anterior thigh approach to the right femoral dermatotomy  site. The angiographic catheter was exchanged over an Amplatz wire for serial vascular dilators which allow placement of a peel-away sheath, through which the catheter was advanced under intermittent fluoroscopy, positioned with its tips at the inferior cavoatrial junction. Spot chest radiograph confirms good catheter position. No pneumothorax. Catheter was flushed and primed per protocol. Catheter secured externally with O Prolene sutures. The right femoral dermatotomy site was closed with Dermabond. COMPLICATIONS: COMPLICATIONS None immediate FLUOROSCOPY TIME:  9 minutes, 308 uGym2 DAP COMPARISON:  06/02/2015 and previous IMPRESSION: 1. Technically successful placement of tunneled right femoral hemodialysis catheter with ultrasound and fluoroscopic guidance. Ready for routine use. 2. 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 Re- cannulization attempts to gain access to the SVC were not successful. ACCESS: There is no upper extremity central venous access. Future catheters will require femoral, trans lumbar, or other alternative access approaches. Electronically Signed   By: Lucrezia Europe M.D.   On: 07/17/2016 11:16   Ir US Guide Vasc Access Right  Result Date: 07/17/2016 CLINICAL DATA:  End-stage renal disease on hemodialysis. Planned fistula ligation. Intermediate to long-term venous access needed for hemodialysis. History of multiple venous occlusions. EXAM: 1. RIGHT UPPER EXTREMITY VENOGRAM 2. TUNNELED HEMODIALYSIS CATHETER PLACEMENT WITH ULTRASOUND AND FLUOROSCOPIC GUIDANCE 3. ULTRASOUND GUIDANCE FOR VASCULAR ACCESS TECHNIQUE: The procedure, risks, benefits, and alternatives were explained to the patient. Questions regarding the procedure were encouraged and answered. The patient understands and consents to the procedure. As antibiotic prophylaxis, vancomycin 1 g was ordered pre-procedure and administered intravenously within one hour of incision. Review of previous imaging revealed long  segment occlusion of the left innominate vein. A patent right IJ vein could not be identified on survey ultrasound. The right external jugular vein was identified, which had been shown to give access to the SVC on the previous CT from 2016. Patency of the right external jugular vein was confirmed with ultrasound with image documentation. An appropriate skin site was determined. Region was prepped using maximum barrier technique including cap and mask, sterile gown, sterile gloves, large sterile sheet, and Chlorhexidine as cutaneous antisepsis. The region was infiltrated locally with 1% lidocaine. Under real-time ultrasound guidance, the right external jugular vein was accessed with a 21 gauge micropuncture needle; the needle tip within the vein was confirmed with ultrasound image documentation. Needle exchanged over the 018 guidewire for a 3 [place] microdilator, which allowed venography, demonstrating occlusion of the right UPJ vein centrally. There is collateral reconstitution of the right axillary and subclavian vein. The subclavian vein tapers and occludes centrally. Body wall collateral strain the right upper extremity. Because of the tapered appearance of the right subclavian vein, recanalization was attempted. Under real-time ultrasound guidance, the right subclavian vein was accessed with a 21 gauge micropuncture needle; the needle tip within the vein was confirmed with ultrasound image documentation. Needle exchanged over the 018 guidewire for a 3  [place] microdilator, which allowed venography, demonstrating occlusion of the right subclavian vein centrally. The micro dilator was exchanged for the transitional dilator, which allowed advancement of the [facility] wire. The tract was dilated to the site allow advancement of an angled 5 French angiographic Kumpe the catheter. Central made with the aid of an angled Glidewire stiff Glidewire to traverse the central subclavian occlusion and gain access to the SVC.  During these attempts, the Kumpe the catheter was exchanged for a 4 French angled glide catheter. Ultimately, the included segments could not be traversed. This required femoral access to be used. Patency of the right common femoral vein was confirmed with ultrasound with image documentation. An appropriate skin site was determined. Region was prepped using maximum barrier technique including cap and mask, sterile gown, sterile gloves, large sterile sheet, and Chlorhexidine as cutaneous antisepsis. The region was infiltrated locally with 1% lidocaine. Under real-time ultrasound guidance, the right common femoral vein was accessed with a 21 gauge micropuncture needle; the needle tip within the vein was confirmed with ultrasound image documentation. Needle exchanged over the 018 guidewire for transitional dilator which allowed advancement of the [facility] wire into the IVC. A 5 French angiographic catheter was placed and intravascular length was measured. A Palindrome 44 cm hemodialysis catheter was tunneled from the right proximal anterior thigh approach to the right femoral dermatotomy site. The angiographic catheter was exchanged over an Amplatz wire for serial vascular dilators which allow placement of a peel-away sheath, through which the catheter was advanced under intermittent fluoroscopy, positioned with its tips at the inferior cavoatrial junction. Spot chest radiograph confirms good catheter position. No pneumothorax. Catheter was flushed and primed per protocol. Catheter secured externally with O Prolene sutures. The right femoral dermatotomy site was closed with Dermabond. COMPLICATIONS: COMPLICATIONS None immediate FLUOROSCOPY TIME:  9 minutes, 308 uGym2 DAP COMPARISON:  06/02/2015 and previous IMPRESSION: 1. Technically successful placement of tunneled right femoral hemodialysis catheter with ultrasound and fluoroscopic guidance. Ready for routine use. 2. 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 Re- cannulization attempts to gain access to the SVC were not successful. ACCESS: There is no upper extremity central venous access. Future catheters will require femoral, trans lumbar, or other alternative access approaches. Electronically Signed   By: Lucrezia Europe M.D.   On: 07/17/2016 11:16   Ir US Guide Vasc Access Right  Result Date: 07/17/2016 CLINICAL DATA:  End-stage renal disease on hemodialysis. Planned fistula ligation. Intermediate to long-term venous access needed for hemodialysis. History of multiple venous occlusions. EXAM: 1. RIGHT UPPER EXTREMITY VENOGRAM 2. TUNNELED HEMODIALYSIS CATHETER PLACEMENT WITH ULTRASOUND AND FLUOROSCOPIC GUIDANCE 3. ULTRASOUND GUIDANCE FOR VASCULAR ACCESS TECHNIQUE: The procedure, risks, benefits, and alternatives were explained to the patient. Questions regarding the procedure were encouraged and answered. The patient understands and consents to the procedure. As antibiotic prophylaxis, vancomycin 1 g was ordered pre-procedure and administered intravenously within one hour of incision. Review of previous imaging revealed long segment occlusion of the left innominate vein. A patent right IJ vein could not be identified on survey ultrasound. The right external jugular vein was identified, which had been shown to give access to the SVC on the previous CT from 2016. Patency of the right external jugular vein was confirmed with ultrasound with image documentation. An appropriate skin site was determined. Region was prepped using maximum barrier technique including cap and mask, sterile gown, sterile gloves, large sterile sheet, and Chlorhexidine as cutaneous antisepsis. The region was infiltrated locally with 1% lidocaine.  Under real-time ultrasound guidance, the right external jugular vein was accessed with a 21 gauge micropuncture needle; the needle tip within the vein was confirmed with ultrasound image documentation. Needle exchanged over the 018 guidewire  for a 3 [place] microdilator, which allowed venography, demonstrating occlusion of the right UPJ vein centrally. There is collateral reconstitution of the right axillary and subclavian vein. The subclavian vein tapers and occludes centrally. Body wall collateral strain the right upper extremity. Because of the tapered appearance of the right subclavian vein, recanalization was attempted. Under real-time ultrasound guidance, the right subclavian vein was accessed with a 21 gauge micropuncture needle; the needle tip within the vein was confirmed with ultrasound image documentation. Needle exchanged over the 018 guidewire for a 3 [place] microdilator, which allowed venography, demonstrating occlusion of the right subclavian vein centrally. The micro dilator was exchanged for the transitional dilator, which allowed advancement of the [facility] wire. The tract was dilated to the site allow advancement of an angled 5 French angiographic Kumpe the catheter. Central made with the aid of an angled Glidewire stiff Glidewire to traverse the central subclavian occlusion and gain access to the SVC. During these attempts, the Kumpe the catheter was exchanged for a 4 French angled glide catheter. Ultimately, the included segments could not be traversed. This required femoral access to be used. Patency of the right common femoral vein was confirmed with ultrasound with image documentation. An appropriate skin site was determined. Region was prepped using maximum barrier technique including cap and mask, sterile gown, sterile gloves, large sterile sheet, and Chlorhexidine as cutaneous antisepsis. The region was infiltrated locally with 1% lidocaine. Under real-time ultrasound guidance, the right common femoral vein was accessed with a 21 gauge micropuncture needle; the needle tip within the vein was confirmed with ultrasound image documentation. Needle exchanged over the 018 guidewire for transitional dilator which allowed advancement of  the [facility] wire into the IVC. A 5 French angiographic catheter was placed and intravascular length was measured. A Palindrome 44 cm hemodialysis catheter was tunneled from the right proximal anterior thigh approach to the right femoral dermatotomy site. The angiographic catheter was exchanged over an Amplatz wire for serial vascular dilators which allow placement of a peel-away sheath, through which the catheter was advanced under intermittent fluoroscopy, positioned with its tips at the inferior cavoatrial junction. Spot chest radiograph confirms good catheter position. No pneumothorax. Catheter was flushed and primed per protocol. Catheter secured externally with O Prolene sutures. The right femoral dermatotomy site was closed with Dermabond. COMPLICATIONS: COMPLICATIONS None immediate FLUOROSCOPY TIME:  9 minutes, 308 uGym2 DAP COMPARISON:  06/02/2015 and previous IMPRESSION: 1. Technically successful placement of tunneled right femoral hemodialysis catheter with ultrasound and fluoroscopic guidance. Ready for routine use. 2. 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 Re- cannulization attempts to gain access to the SVC were not successful. ACCESS: There is no upper extremity central venous access. Future catheters will require femoral, trans lumbar, or other alternative access approaches. Electronically Signed   By: Lucrezia Europe M.D.   On: 07/17/2016 11:16   Dg Chest Port 1 View  Result Date: 07/17/2016 CLINICAL DATA:  Pulmonary edema, history COPD, diabetes mellitus, asthma, coronary artery disease, end-stage renal disease on dialysis EXAM: PORTABLE CHEST 1 VIEW COMPARISON:  Portable exam 0450 hours compared to 07/16/2016 FINDINGS: Minimal enlargement of cardiac silhouette. Mediastinal contours and pulmonary vascularity normal. Lungs now clear. No pleural effusion or pneumothorax. No significant osseous findings. IMPRESSION: No acute abnormalities. Pulmonary edema seen on  previous exam  improved. Electronically Signed   By: Lavonia Dana M.D.   On: 07/17/2016 07:39   Dg Chest Portable 1 View  Result Date: 07/16/2016 CLINICAL DATA:  Hypoxia EXAM: PORTABLE CHEST 1 VIEW COMPARISON:  06/13/2016 FINDINGS: Diffuse interstitial opacity with occasional Kerley lines. No pleural effusion. Chronic cardiopericardial enlargement. Left brachiocephalic stent again noted. IMPRESSION: Mild pulmonary edema. Electronically Signed   By: Monte Fantasia M.D.   On: 07/16/2016 15:48     Assessment/Plan    Natilie Krabbenhoft S. Perlie Gold  [facility] [place], [place] [address] [phone] Cell (Monday-Friday 8 AM - 5 PM) [phone] After 5 PM and follow prompts  This encounter was created in error - please disregard.

## 2016-07-29 ENCOUNTER — Encounter: Payer: Self-pay | Admitting: Internal Medicine

## 2016-07-29 ENCOUNTER — Non-Acute Institutional Stay (SKILLED_NURSING_FACILITY): Payer: Medicare Other | Admitting: Internal Medicine

## 2016-07-29 DIAGNOSIS — E1122 Type 2 diabetes mellitus with diabetic chronic kidney disease: Secondary | ICD-10-CM

## 2016-07-29 DIAGNOSIS — E032 Hypothyroidism due to medicaments and other exogenous substances: Secondary | ICD-10-CM

## 2016-07-29 DIAGNOSIS — Z992 Dependence on renal dialysis: Secondary | ICD-10-CM | POA: Diagnosis not present

## 2016-07-29 DIAGNOSIS — Z9115 Patient's noncompliance with renal dialysis: Secondary | ICD-10-CM | POA: Diagnosis not present

## 2016-07-29 DIAGNOSIS — N186 End stage renal disease: Secondary | ICD-10-CM | POA: Diagnosis not present

## 2016-07-29 DIAGNOSIS — N185 Chronic kidney disease, stage 5: Secondary | ICD-10-CM

## 2016-07-29 DIAGNOSIS — I120 Hypertensive chronic kidney disease with stage 5 chronic kidney disease or end stage renal disease: Secondary | ICD-10-CM | POA: Diagnosis not present

## 2016-07-29 DIAGNOSIS — T462X1A Poisoning by other antidysrhythmic drugs, accidental (unintentional), initial encounter: Secondary | ICD-10-CM

## 2016-07-29 DIAGNOSIS — J449 Chronic obstructive pulmonary disease, unspecified: Secondary | ICD-10-CM | POA: Diagnosis not present

## 2016-07-29 DIAGNOSIS — I82722 Chronic embolism and thrombosis of deep veins of left upper extremity: Secondary | ICD-10-CM

## 2016-07-29 DIAGNOSIS — F3130 Bipolar disorder, current episode depressed, mild or moderate severity, unspecified: Secondary | ICD-10-CM | POA: Diagnosis not present

## 2016-07-29 DIAGNOSIS — F319 Bipolar disorder, unspecified: Secondary | ICD-10-CM

## 2016-07-29 DIAGNOSIS — I482 Chronic atrial fibrillation, unspecified: Secondary | ICD-10-CM

## 2016-07-29 DIAGNOSIS — I5032 Chronic diastolic (congestive) heart failure: Secondary | ICD-10-CM

## 2016-07-29 DIAGNOSIS — D631 Anemia in chronic kidney disease: Secondary | ICD-10-CM

## 2016-07-29 DIAGNOSIS — Z91158 Patient's noncompliance with renal dialysis for other reason: Secondary | ICD-10-CM

## 2016-07-29 NOTE — Progress Notes (Signed)
Patient ID: Noah Lewis, male   DOB: 11-06-1950, 65 y.o.   MRN: [id number]    HISTORY AND PHYSICAL   DATE: 07/29/2016  Location:    [facility] Room Number: 616 B Place of Service: SNF (31)   Extended Emergency Contact Information Primary Emergency Contact: Hayes,Barbara Address: [address], [place] [address] [place] Phone: [phone] Relation: Mother Secondary Emergency Contact: Hayes,[name] Address: [address]          [place], [place] [address] [place] Phone: [phone] Relation: Brother  Advanced Directive information Does patient have an advance directive?: Yes, Does patient want to make changes to advanced directive?: No - Patient declined  Chief Complaint  Patient presents with  . Readmit To SNF    HPI:  65 yo male long term resident seen today as a readmission into SNF following hospital stay for acute pulmonary edema. He had missed HD prior to admission. He req'd left thigh AVG placement on 10/30th. Temporary HD cath placed 10/24th until can use permanent AVG in thigh. He was started on coumadin for afib with plan to have lower INR goal 2-2.5 due to increased risk of bleeding. Eliquis, doxy and remeron stopped prior to d/c. INR 1.2 at d/c; Hgb 12; K+ 4.7 (down from 5.7); Na 134; albumin 3.6. He presents to SNF for long term care  Today he reports right thigh pain at perm-a-cath site. No other concerns. He is a poor historian due to psych d/o. Hx obtained from chart.  Hypertension - BP controlled on lopressor 12.5 mg twice daily   Chronic afib - rate controlled on amoidarone 400 mg daily and  lopressor 12.5 mg twice daily. eliquis changed to coumadin with INR goal 2-2.5 prior to hospital d/c  COPD/seasonal allergy - stable on duoneb every 6 hours as needed and takes claritin 10 mg daily   Hypothyroidism: - takes synthroid 75 mcg daily. TSH 16.62 in Sept 2017  Anemia of chronic  kidney disease - Hgb 12 at hospital d/c. He gets  aranesp weekly at dialysis   ESRD on HD via right thigh perm-a-cath on TThSa - he had a left thigh AVG placed on 07/22/16. He takes renagel 2400 mg three times daily   Chronic diastolic heart failure - EF 50-55% (12-05-15). Takes  lopressor 12.5 mg twice daily.  fluid volume managed at dialysis  OSA - he is non compliant with cpap  CAD - stable. No CP c/o. Takes asa 81 mg daily  Bipolar disorder/ depression- mood stable. He takes remeron 15 mg nightly; lamictal 50 mg daiy to stabilize mood. He has a history of suicidal ideation   Chronically occluded left subclavian vein - started on coumadin prior to d/c. GOAL INR  2-2.5.   DM - diet controlled. No recent A1c but was 5.7% in Aug 2016.    Past Medical History:  Diagnosis Date  . Arthritis   . Asthma   . Cancer ([facility])   . COPD (chronic obstructive pulmonary disease) ([facility])   . Coronary artery disease   . Diabetes mellitus without complication ([place])   . Hypertension   . Renal disorder     Past Surgical History:  Procedure Laterality Date  . AV FISTULA PLACEMENT Left 07/22/2016   Procedure: INSERTION OF ARTERIOVENOUS (AV) LOOP GRAFT LEFT THIGH;  Surgeon: Waynetta Sandy, MD;  Location: [facility];  Service: Vascular;  Laterality: Left;  .  INSERTION OF DIALYSIS CATHETER    . INSERTION OF DIALYSIS CATHETER N/A 05/29/2015   Procedure: INSERTION OF DIALYSIS CATHETER;  Surgeon: Angelia Mould, MD;  Location: [facility];  Service: Vascular;  Laterality: N/A;  . IR GENERIC HISTORICAL  07/16/2016   IR US GUIDE VASC ACCESS RIGHT 07/16/2016 Arne Cleveland, MD [facility]  . IR GENERIC HISTORICAL  07/16/2016   IR FLUORO GUIDE CV LINE RIGHT 07/16/2016 Arne Cleveland, MD [facility]  . IR GENERIC HISTORICAL  07/16/2016   IR US GUIDE VASC ACCESS RIGHT 07/16/2016 Arne Cleveland, MD [facility]  . IR GENERIC HISTORICAL  07/16/2016   IR VENO/EXT/UNI RIGHT 07/16/2016 Arne Cleveland, MD  [facility]  . LIGATION OF ARTERIOVENOUS  FISTULA Left 07/17/2016   Procedure: LIGATION OF BRACHIOCEPHALIC ARTERIOVENOUS  FISTULA  Left arm;  Surgeon: Rosetta Posner, MD;  Location: [facility];  Service: Vascular;  Laterality: Left;  . NO PAST SURGERIES    . PERIPHERAL VASCULAR CATHETERIZATION N/A 10/25/2015   Procedure: Dialysis/Perma Catheter Insertion;  Surgeon: Katha Cabal, MD;  Location: [facility];  Service: Cardiovascular;  Laterality: N/A;  . PERIPHERAL VASCULAR CATHETERIZATION Left 02/22/2016   Procedure: A/V Shuntogram/Fistulagram;  Surgeon: Algernon Huxley, MD;  Location: [facility];  Service: Cardiovascular;  Laterality: Left;  . PERIPHERAL VASCULAR CATHETERIZATION Right 03/19/2016   Procedure: Dialysis/Perma Catheter Insertion;  Surgeon: Katha Cabal, MD;  Location: [facility];  Service: Cardiovascular;  Laterality: Right;  . PERIPHERAL VASCULAR CATHETERIZATION N/A 04/22/2016   Procedure: Dialysis/Perma Catheter Removal;  Surgeon: Algernon Huxley, MD;  Location: [facility];  Service: Cardiovascular;  Laterality: N/A;    Patient Care Team: Theotis Burrow, MD as PCP - General (Family Medicine)  Social History   Social History  . Marital status: Legally Separated    Spouse name: N/A  . Number of children: N/A  . Years of education: N/A   Occupational History  . Not on file.   Social History Main Topics  . Smoking status: Never Smoker  . Smokeless tobacco: Never Used  . Alcohol use No  . Drug use:     Types: Marijuana     Comment: Occasionally  . Sexual activity: Not on file   Other Topics Concern  . Not on file   Social History Narrative  . No narrative on file     reports that he has never smoked. He has never used smokeless tobacco. He reports that he uses drugs, including Marijuana. He reports that he does not drink alcohol.  Family History  Problem Relation Age of Onset  . Hypertension Other   . Diabetes Other     Family Status  Relation Status  . Mother Alive  . Father Deceased  . Other     Immunization History  Administered Date(s) Administered  . PPD Test 05/01/2016    Allergies  Allergen Reactions  . Penicillins Other (See Comments)    Reaction:  Unknown     Medications: Patient's Medications  New Prescriptions   No medications on file  Previous Medications   ACETAMINOPHEN (TYLENOL) 325 MG TABLET    Take 650 mg by mouth every 6 (six) hours as needed for mild pain, fever or headache.   AMIODARONE (PACERONE) 400 MG TABLET    Take 1 tablet (400 mg total) by mouth daily.   ASPIRIN 81 MG CHEWABLE TABLET    Chew 1 tablet (81 mg total) by mouth daily.   B  COMPLEX-VITAMIN C-FOLIC ACID (NEPHRO-VITE) 0.8 MG TABS TABLET    Take 1 tablet by mouth 2 (two) times daily.   CALCITRIOL (ROCALTROL) 0.5 MCG CAPSULE    Take 1 capsule (0.5 mcg total) by mouth every other day.   CAPSAICIN (CAPSAGEL) 0.025 % GEL    Apply 1 application topically 2 (two) times daily as needed (for lower back pain).   IPRATROPIUM-ALBUTEROL (DUONEB) 0.5-2.5 (3) MG/3ML SOLN    Take 3 mLs by nebulization every 6 (six) hours as needed (for wheezing/shortness of breath).   LAMOTRIGINE (LAMICTAL) 25 MG TABLET    Take 2 tablets (50 mg total) by mouth daily.   LEVOTHYROXINE (SYNTHROID, LEVOTHROID) 75 MCG TABLET    Take 75 mcg by mouth daily before breakfast.    LORATADINE (CLARITIN) 10 MG TABLET    Take 1 tablet (10 mg total) by mouth daily.   METOPROLOL TARTRATE (LOPRESSOR) 25 MG TABLET    Take 0.5 tablets (12.5 mg total) by mouth 2 (two) times daily.   MIRTAZAPINE (REMERON SOL-TAB) 15 MG DISINTEGRATING TABLET    Take 1 tablet (15 mg total) by mouth at bedtime.   POLYETHYLENE GLYCOL (MIRALAX / GLYCOLAX) PACKET    Take 17 g by mouth daily as needed for mild constipation.    SACCHAROMYCES BOULARDII (FLORASTOR) 250 MG CAPSULE    Take 250 mg by mouth 2 (two) times daily.   SEVELAMER (RENAGEL) 800 MG TABLET    Take 2,400 mg by mouth 3  (three) times daily with meals.   TRAVOPROST, BAK FREE, (TRAVATAN) 0.004 % SOLN OPHTHALMIC SOLUTION    Place 1 drop into both eyes at bedtime.   WARFARIN (COUMADIN) 5 MG TABLET    Take 1 tablet (5 mg total) by mouth one time only at 6 PM.  Modified Medications   No medications on file  Discontinued Medications   No medications on file    Review of Systems  Unable to perform ROS: Psychiatric disorder     Vitals:   07/25/16 1121  BP: 112/73  Pulse: 78  Resp: 18  Temp: 97.7 F (36.5 C)  TempSrc: Oral  SpO2: 96%  Weight: 175 lb (79.4 kg)  Height: '5\' 7"'$  (1.702 m)   Body mass index is 27.41 kg/m.  Physical Exam  Constitutional: He is well-developed, well-nourished, and in no distress. No distress.  HENT:  Mouth/Throat: Oropharynx is clear and moist. No oropharyngeal exudate.  Eyes: Pupils are equal, round, and reactive to light.  Neck: Neck supple. No tracheal deviation present.  Cardiovascular: Normal rate.  An irregularly irregular rhythm present.  No extrasystoles are present. Exam reveals no gallop and no friction rub.   Murmur (1/6 SEM) heard. Trace R>LLE edema. No calf TTP. Right permacath intact with no bleeding or d/c at insertion site. Left thigh AVG incisional site intact with no bleeding or wound dehiscence. (+) bruit noted but no thrill  Pulmonary/Chest: Effort normal and breath sounds normal. No respiratory distress. He has no wheezes. He has no rales. He exhibits no tenderness.  Abdominal: Soft. Bowel sounds are normal. He exhibits no distension and no mass. There is no tenderness. There is no rebound and no guarding.  Musculoskeletal: He exhibits edema and tenderness.  Lymphadenopathy:    He has no cervical adenopathy.  Neurological: He is alert.  Skin: Skin is warm and dry. No rash noted.  Psychiatric: Affect normal. He exhibits a depressed mood. He exhibits abnormal recent memory.     Labs reviewed: Admission on 07/16/2016, Discharged on  07/23/2016    Component Date Value Ref Range Status  . Sodium 07/16/2016 138  135 - 145 mmol/L Final  . Potassium 07/16/2016 5.7* 3.5 - 5.1 mmol/L Final  . Chloride 07/16/2016 96* 101 - 111 mmol/L Final  . CO2 07/16/2016 22  22 - 32 mmol/L Final  . Glucose, Bld 07/16/2016 110* 65 - 99 mg/dL Final  . BUN 07/16/2016 82* 6 - 20 mg/dL Final  . Creatinine, Ser 07/16/2016 17.91* 0.61 - 1.24 mg/dL Final  . Calcium 07/16/2016 8.3* 8.9 - 10.3 mg/dL Final  . Total Protein 07/16/2016 8.1  6.5 - 8.1 g/dL Final  . Albumin 07/16/2016 3.9  3.5 - 5.0 g/dL Final  . AST 07/16/2016 18  15 - 41 U/L Final  . ALT 07/16/2016 14* 17 - 63 U/L Final  . Alkaline Phosphatase 07/16/2016 109  38 - 126 U/L Final  . Total Bilirubin 07/16/2016 0.8  0.3 - 1.2 mg/dL Final  . GFR calc non Af Amer 07/16/2016 2* >60 mL/min Final  . GFR calc Af Amer 07/16/2016 3* >60 mL/min Final   Comment: (NOTE) The eGFR has been calculated using the CKD EPI equation. This calculation has not been validated in all clinical situations. eGFR's persistently <60 mL/min signify possible Chronic Kidney Disease.   . Anion gap 07/16/2016 20* 5 - 15 Final  . Lactic Acid, Venous 07/16/2016 2.23* 0.5 - 1.9 mmol/L Final  . Comment 07/16/2016 NOTIFIED PHYSICIAN   Final  . WBC 07/16/2016 8.2  4.0 - 10.5 K/uL Final  . RBC 07/16/2016 4.25  4.22 - 5.81 MIL/uL Final  . Hemoglobin 07/16/2016 11.9* 13.0 - 17.0 g/dL Final  . HCT 07/16/2016 37.3* 39.0 - 52.0 % Final  . MCV 07/16/2016 87.8  78.0 - 100.0 fL Final  . MCH 07/16/2016 28.0  26.0 - 34.0 pg Final  . MCHC 07/16/2016 31.9  30.0 - 36.0 g/dL Final  . RDW 07/16/2016 17.5* 11.5 - 15.5 % Final  . Platelets 07/16/2016 205  150 - 400 K/uL Final  . Neutrophils Relative % 07/16/2016 80  % Final  . Neutro Abs 07/16/2016 6.5  1.7 - 7.7 K/uL Final  . Lymphocytes Relative 07/16/2016 12  % Final  . Lymphs Abs 07/16/2016 1.0  0.7 - 4.0 K/uL Final  . Monocytes Relative 07/16/2016 6  % Final  . Monocytes Absolute  07/16/2016 0.5  0.1 - 1.0 K/uL Final  . Eosinophils Relative 07/16/2016 2  % Final  . Eosinophils Absolute 07/16/2016 0.1  0.0 - 0.7 K/uL Final  . Basophils Relative 07/16/2016 0  % Final  . Basophils Absolute 07/16/2016 0.0  0.0 - 0.1 K/uL Final  . Procalcitonin 07/17/2016 0.29  ng/mL Final   Comment:        Interpretation: PCT (Procalcitonin) <= 0.5 ng/mL: Systemic infection (sepsis) is not likely. Local bacterial infection is possible. (NOTE)         ICU PCT Algorithm               Non ICU PCT Algorithm    ----------------------------     ------------------------------         PCT < 0.25 ng/mL                 PCT < 0.1 ng/mL     Stopping of antibiotics            Stopping of antibiotics       strongly encouraged.  strongly encouraged.    ----------------------------     ------------------------------       PCT level decrease by               PCT < 0.25 ng/mL       >= 80% from peak PCT       OR PCT 0.25 - 0.5 ng/mL          Stopping of antibiotics                                             encouraged.     Stopping of antibiotics           encouraged.    ----------------------------     ------------------------------       PCT level decrease by              PCT >= 0.25 ng/mL       < 80% from peak PCT        AND PCT >= 0.5 ng/mL            Continuin                          g antibiotics                                              encouraged.       Continuing antibiotics            encouraged.    ----------------------------     ------------------------------     PCT level increase compared          PCT > 0.5 ng/mL         with peak PCT AND          PCT >= 0.5 ng/mL             Escalation of antibiotics                                          strongly encouraged.      Escalation of antibiotics        strongly encouraged.   Marland Kitchen MRSA by PCR 07/17/2016 NEGATIVE  NEGATIVE Final   Comment:        The GeneXpert MRSA Assay (FDA approved for NASAL specimens only), is  one component of a comprehensive MRSA colonization surveillance program. It is not intended to diagnose MRSA infection nor to guide or monitor treatment for MRSA infections.   . Heparin Unfractionated 07/17/2016 0.16* 0.30 - 0.70 IU/mL Final   Comment:        IF HEPARIN RESULTS ARE BELOW EXPECTED VALUES, AND PATIENT DOSAGE HAS BEEN CONFIRMED, SUGGEST FOLLOW UP TESTING OF ANTITHROMBIN III LEVELS.   . WBC 07/17/2016 7.1  4.0 - 10.5 K/uL Final  . RBC 07/17/2016 4.10* 4.22 - 5.81 MIL/uL Final  . Hemoglobin 07/17/2016 11.6* 13.0 - 17.0 g/dL Final  . HCT 07/17/2016 35.6* 39.0 - 52.0 % Final  . MCV 07/17/2016 86.8  78.0 - 100.0 fL Final  . MCH 07/17/2016 28.3  26.0 - 34.0 pg Final  . MCHC  07/17/2016 32.6  30.0 - 36.0 g/dL Final  . RDW 07/17/2016 17.2* 11.5 - 15.5 % Final  . Platelets 07/17/2016 189  150 - 400 K/uL Final  . aPTT 07/17/2016 38* 24 - 36 seconds Final   Comment:        IF BASELINE aPTT IS ELEVATED, SUGGEST PATIENT RISK ASSESSMENT BE USED TO DETERMINE APPROPRIATE ANTICOAGULANT THERAPY.   . Sodium 07/17/2016 136  135 - 145 mmol/L Final  . Potassium 07/17/2016 4.7  3.5 - 5.1 mmol/L Final  . Chloride 07/17/2016 95* 101 - 111 mmol/L Final  . CO2 07/17/2016 26  22 - 32 mmol/L Final  . Glucose, Bld 07/17/2016 81  65 - 99 mg/dL Final  . BUN 07/17/2016 37* 6 - 20 mg/dL Final  . Creatinine, Ser 07/17/2016 11.27* 0.61 - 1.24 mg/dL Final  . Calcium 07/17/2016 8.3* 8.9 - 10.3 mg/dL Final  . Phosphorus 07/17/2016 7.1* 2.5 - 4.6 mg/dL Final  . Albumin 07/17/2016 3.8  3.5 - 5.0 g/dL Final  . GFR calc non Af Amer 07/17/2016 4* >60 mL/min Final  . GFR calc Af Amer 07/17/2016 5* >60 mL/min Final   Comment: (NOTE) The eGFR has been calculated using the CKD EPI equation. This calculation has not been validated in all clinical situations. eGFR's persistently <60 mL/min signify possible Chronic Kidney Disease.   . Anion gap 07/17/2016 15  5 - 15 Final  . Prothrombin Time 07/17/2016  15.3* 11.4 - 15.2 seconds Final  . INR 07/17/2016 1.20   Final  . Glucose-Capillary 07/17/2016 86  65 - 99 mg/dL Final  . Glucose-Capillary 07/17/2016 119* 65 - 99 mg/dL Final  . Procalcitonin 07/18/2016 0.75  ng/mL Final   Comment:        Interpretation: PCT > 0.5 ng/mL and <= 2 ng/mL: Systemic infection (sepsis) is possible, but other conditions are known to elevate PCT as well. (NOTE)         ICU PCT Algorithm               Non ICU PCT Algorithm    ----------------------------     ------------------------------         PCT < 0.25 ng/mL                 PCT < 0.1 ng/mL     Stopping of antibiotics            Stopping of antibiotics       strongly encouraged.               strongly encouraged.    ----------------------------     ------------------------------       PCT level decrease by               PCT < 0.25 ng/mL       >= 80% from peak PCT       OR PCT 0.25 - 0.5 ng/mL          Stopping of antibiotics                                             encouraged.     Stopping of antibiotics           encouraged.    ----------------------------     ------------------------------       PCT level decrease by  PCT >= 0.25 ng/mL       < 80% from peak PCT        AND PCT >= 0.5 ng/mL                                      Continuing antibiotics                                              encouraged.       Continuing antibiotics            encouraged.    ----------------------------     ------------------------------     PCT level increase compared          PCT > 0.5 ng/mL         with peak PCT AND          PCT >= 0.5 ng/mL             Escalation of antibiotics                                          strongly encouraged.      Escalation of antibiotics        strongly encouraged.   . Heparin Unfractionated 07/18/2016 <0.10* 0.30 - 0.70 IU/mL Final   Comment:        IF HEPARIN RESULTS ARE BELOW EXPECTED VALUES, AND PATIENT DOSAGE HAS BEEN CONFIRMED, SUGGEST FOLLOW UP  TESTING OF ANTITHROMBIN III LEVELS. REPEATED TO VERIFY   . aPTT 07/18/2016 29  24 - 36 seconds Final  . WBC 07/18/2016 5.7  4.0 - 10.5 K/uL Final  . RBC 07/18/2016 3.80* 4.22 - 5.81 MIL/uL Final  . Hemoglobin 07/18/2016 10.4* 13.0 - 17.0 g/dL Final  . HCT 07/18/2016 32.9* 39.0 - 52.0 % Final  . MCV 07/18/2016 86.6  78.0 - 100.0 fL Final  . MCH 07/18/2016 27.4  26.0 - 34.0 pg Final  . MCHC 07/18/2016 31.6  30.0 - 36.0 g/dL Final  . RDW 07/18/2016 17.2* 11.5 - 15.5 % Final  . Platelets 07/18/2016 171  150 - 400 K/uL Final  . Neutrophils Relative % 07/18/2016 73  % Final  . Neutro Abs 07/18/2016 4.2  1.7 - 7.7 K/uL Final  . Lymphocytes Relative 07/18/2016 8  % Final  . Lymphs Abs 07/18/2016 0.5* 0.7 - 4.0 K/uL Final  . Monocytes Relative 07/18/2016 13  % Final  . Monocytes Absolute 07/18/2016 0.7  0.1 - 1.0 K/uL Final  . Eosinophils Relative 07/18/2016 6  % Final  . Eosinophils Absolute 07/18/2016 0.3  0.0 - 0.7 K/uL Final  . Basophils Relative 07/18/2016 0  % Final  . Basophils Absolute 07/18/2016 0.0  0.0 - 0.1 K/uL Final  . Heparin Unfractionated 07/19/2016 <0.10* 0.30 - 0.70 IU/mL Final   Comment:        IF HEPARIN RESULTS ARE BELOW EXPECTED VALUES, AND PATIENT DOSAGE HAS BEEN CONFIRMED, SUGGEST FOLLOW UP TESTING OF ANTITHROMBIN III LEVELS. REPEATED TO VERIFY   . WBC 07/19/2016 6.4  4.0 - 10.5 K/uL Final  . RBC 07/19/2016 4.21* 4.22 - 5.81 MIL/uL Final  . Hemoglobin 07/19/2016 11.6* 13.0 - 17.0 g/dL Final  .  HCT 07/19/2016 37.0* 39.0 - 52.0 % Final  . MCV 07/19/2016 87.9  78.0 - 100.0 fL Final  . MCH 07/19/2016 27.6  26.0 - 34.0 pg Final  . MCHC 07/19/2016 31.4  30.0 - 36.0 g/dL Final  . RDW 07/19/2016 17.0* 11.5 - 15.5 % Final  . Platelets 07/19/2016 170  150 - 400 K/uL Final  . aPTT 07/19/2016 30  24 - 36 seconds Final  . Procalcitonin 07/20/2016 0.55  ng/mL Final   Comment:        Interpretation: PCT > 0.5 ng/mL and <= 2 ng/mL: Systemic infection (sepsis) is  possible, but other conditions are known to elevate PCT as well. (NOTE)         ICU PCT Algorithm               Non ICU PCT Algorithm    ----------------------------     ------------------------------         PCT < 0.25 ng/mL                 PCT < 0.1 ng/mL     Stopping of antibiotics            Stopping of antibiotics       strongly encouraged.               strongly encouraged.    ----------------------------     ------------------------------       PCT level decrease by               PCT < 0.25 ng/mL       >= 80% from peak PCT       OR PCT 0.25 - 0.5 ng/mL          Stopping of antibiotics                                             encouraged.     Stopping of antibiotics           encouraged.    ----------------------------     ------------------------------       PCT level decrease by              PCT >= 0.25 ng/mL       < 80% from peak PCT        AND PCT >= 0.5 ng/mL                                      Continuing antibiotics                                              encouraged.       Continuing antibiotics            encouraged.    ----------------------------     ------------------------------     PCT level increase compared          PCT > 0.5 ng/mL         with peak PCT AND          PCT >= 0.5 ng/mL             Escalation of  antibiotics                                          strongly encouraged.      Escalation of antibiotics        strongly encouraged.   . WBC 07/20/2016 8.2  4.0 - 10.5 K/uL Final  . RBC 07/20/2016 4.06* 4.22 - 5.81 MIL/uL Final  . Hemoglobin 07/20/2016 11.4* 13.0 - 17.0 g/dL Final  . HCT 07/20/2016 35.0* 39.0 - 52.0 % Final  . MCV 07/20/2016 86.2  78.0 - 100.0 fL Final  . MCH 07/20/2016 28.1  26.0 - 34.0 pg Final  . MCHC 07/20/2016 32.6  30.0 - 36.0 g/dL Final  . RDW 07/20/2016 16.6* 11.5 - 15.5 % Final  . Platelets 07/20/2016 167  150 - 400 K/uL Final  . Neutrophils Relative % 07/20/2016 78  % Final  . Neutro Abs 07/20/2016 6.4  1.7 - 7.7  K/uL Final  . Lymphocytes Relative 07/20/2016 6  % Final  . Lymphs Abs 07/20/2016 0.5* 0.7 - 4.0 K/uL Final  . Monocytes Relative 07/20/2016 10  % Final  . Monocytes Absolute 07/20/2016 0.8  0.1 - 1.0 K/uL Final  . Eosinophils Relative 07/20/2016 6  % Final  . Eosinophils Absolute 07/20/2016 0.5  0.0 - 0.7 K/uL Final  . Basophils Relative 07/20/2016 0  % Final  . Basophils Absolute 07/20/2016 0.0  0.0 - 0.1 K/uL Final  . Sodium 07/20/2016 130* 135 - 145 mmol/L Final  . Potassium 07/20/2016 3.2* 3.5 - 5.1 mmol/L Final  . Chloride 07/20/2016 92* 101 - 111 mmol/L Final  . CO2 07/20/2016 25  22 - 32 mmol/L Final  . Glucose, Bld 07/20/2016 191* 65 - 99 mg/dL Final  . BUN 07/20/2016 14  6 - 20 mg/dL Final  . Creatinine, Ser 07/20/2016 5.17* 0.61 - 1.24 mg/dL Final  . Calcium 07/20/2016 8.5* 8.9 - 10.3 mg/dL Final  . GFR calc non Af Amer 07/20/2016 11* >60 mL/min Final  . GFR calc Af Amer 07/20/2016 12* >60 mL/min Final   Comment: (NOTE) The eGFR has been calculated using the CKD EPI equation. This calculation has not been validated in all clinical situations. eGFR's persistently <60 mL/min signify possible Chronic Kidney Disease.   . Anion gap 07/20/2016 13  5 - 15 Final  . Heparin Unfractionated 07/19/2016 0.27* 0.30 - 0.70 IU/mL Final   Comment:        IF HEPARIN RESULTS ARE BELOW EXPECTED VALUES, AND PATIENT DOSAGE HAS BEEN CONFIRMED, SUGGEST FOLLOW UP TESTING OF ANTITHROMBIN III LEVELS.   Marland Kitchen Heparin Unfractionated 07/20/2016 1.88* 0.30 - 0.70 IU/mL Final   Comment:        IF HEPARIN RESULTS ARE BELOW EXPECTED VALUES, AND PATIENT DOSAGE HAS BEEN CONFIRMED, SUGGEST FOLLOW UP TESTING OF ANTITHROMBIN III LEVELS. RESULTS CONFIRMED BY MANUAL DILUTION   . Sodium 07/20/2016 130* 135 - 145 mmol/L Final  . Potassium 07/20/2016 3.2* 3.5 - 5.1 mmol/L Final  . Chloride 07/20/2016 93* 101 - 111 mmol/L Final  . CO2 07/20/2016 25  22 - 32 mmol/L Final  . Glucose, Bld 07/20/2016 189* 65 -  99 mg/dL Final  . BUN 07/20/2016 14  6 - 20 mg/dL Final  . Creatinine, Ser 07/20/2016 5.28* 0.61 - 1.24 mg/dL Final  . Calcium 07/20/2016 8.6* 8.9 - 10.3 mg/dL Final  . Phosphorus 07/20/2016 3.6  2.5 - 4.6  mg/dL Final  . Albumin 07/20/2016 3.5  3.5 - 5.0 g/dL Final  . GFR calc non Af Amer 07/20/2016 10* >60 mL/min Final  . GFR calc Af Amer 07/20/2016 12* >60 mL/min Final   Comment: (NOTE) The eGFR has been calculated using the CKD EPI equation. This calculation has not been validated in all clinical situations. eGFR's persistently <60 mL/min signify possible Chronic Kidney Disease.   . Anion gap 07/20/2016 12  5 - 15 Final  . Heparin Unfractionated 07/20/2016 0.52  0.30 - 0.70 IU/mL Final   Comment:        IF HEPARIN RESULTS ARE BELOW EXPECTED VALUES, AND PATIENT DOSAGE HAS BEEN CONFIRMED, SUGGEST FOLLOW UP TESTING OF ANTITHROMBIN III LEVELS.   Marland Kitchen Heparin Unfractionated 07/20/2016 0.87* 0.30 - 0.70 IU/mL Final   Comment:        IF HEPARIN RESULTS ARE BELOW EXPECTED VALUES, AND PATIENT DOSAGE HAS BEEN CONFIRMED, SUGGEST FOLLOW UP TESTING OF ANTITHROMBIN III LEVELS.   Marland Kitchen Prothrombin Time 07/22/2016 14.4  11.4 - 15.2 seconds Final  . INR 07/22/2016 1.11   Final  . Sodium 07/22/2016 134* 135 - 145 mmol/L Final  . Potassium 07/22/2016 4.7  3.5 - 5.1 mmol/L Final  . Chloride 07/22/2016 95* 101 - 111 mmol/L Final  . CO2 07/22/2016 24  22 - 32 mmol/L Final  . Glucose, Bld 07/22/2016 96  65 - 99 mg/dL Final  . BUN 07/22/2016 49* 6 - 20 mg/dL Final  . Creatinine, Ser 07/22/2016 10.21* 0.61 - 1.24 mg/dL Final  . Calcium 07/22/2016 9.3  8.9 - 10.3 mg/dL Final  . Phosphorus 07/22/2016 5.8* 2.5 - 4.6 mg/dL Final  . Albumin 07/22/2016 3.6  3.5 - 5.0 g/dL Final  . GFR calc non Af Amer 07/22/2016 5* >60 mL/min Final  . GFR calc Af Amer 07/22/2016 5* >60 mL/min Final   Comment: (NOTE) The eGFR has been calculated using the CKD EPI equation. This calculation has not been validated in all  clinical situations. eGFR's persistently <60 mL/min signify possible Chronic Kidney Disease.   . Anion gap 07/22/2016 15  5 - 15 Final  . WBC 07/22/2016 9.6  4.0 - 10.5 K/uL Final  . RBC 07/22/2016 4.23  4.22 - 5.81 MIL/uL Final  . Hemoglobin 07/22/2016 12.0* 13.0 - 17.0 g/dL Final  . HCT 07/22/2016 36.4* 39.0 - 52.0 % Final  . MCV 07/22/2016 86.1  78.0 - 100.0 fL Final  . MCH 07/22/2016 28.4  26.0 - 34.0 pg Final  . MCHC 07/22/2016 33.0  30.0 - 36.0 g/dL Final  . RDW 07/22/2016 16.9* 11.5 - 15.5 % Final  . Platelets 07/22/2016 201  150 - 400 K/uL Final  . Neutrophils Relative % 07/22/2016 73  % Final  . Neutro Abs 07/22/2016 7.0  1.7 - 7.7 K/uL Final  . Lymphocytes Relative 07/22/2016 12  % Final  . Lymphs Abs 07/22/2016 1.1  0.7 - 4.0 K/uL Final  . Monocytes Relative 07/22/2016 9  % Final  . Monocytes Absolute 07/22/2016 0.9  0.1 - 1.0 K/uL Final  . Eosinophils Relative 07/22/2016 6  % Final  . Eosinophils Absolute 07/22/2016 0.6  0.0 - 0.7 K/uL Final  . Basophils Relative 07/22/2016 0  % Final  . Basophils Absolute 07/22/2016 0.0  0.0 - 0.1 K/uL Final  . Glucose-Capillary 07/22/2016 111* 65 - 99 mg/dL Final  . Comment 1 07/22/2016 Notify RN   Final  . Comment 2 07/22/2016 Document in Chart   Final  . Prothrombin  Time 07/23/2016 15.3* 11.4 - 15.2 seconds Final  . INR 07/23/2016 1.20   Final  Nursing Home on 06/20/2016  Component Date Value Ref Range Status  . Hemoglobin 06/12/2016 10.9* 13.5 - 17.5 g/dL Final  . HCT 06/12/2016 34* 41 - 53 % Final  . Platelets 06/12/2016 184  150 - 399 K/L Final  . WBC 06/12/2016 6.3  10^3/mL Final  . Glucose 06/12/2016 82  mg/dL Final  . BUN 06/12/2016 60* 4 - 21 mg/dL Final  . Creatinine 06/12/2016 15.0* 0.6 - 1.3 mg/dL Final  . Potassium 06/12/2016 5.4* 3.4 - 5.3 mmol/L Final  . Sodium 06/12/2016 142  137 - 147 mmol/L Final  . Alkaline Phosphatase 06/12/2016 113  25 - 125 U/L Final  . ALT 06/12/2016 16  10 - 40 U/L Final  . AST  06/12/2016 15  14 - 40 U/L Final  . Bilirubin, Total 06/12/2016 0.6  mg/dL Final  . TSH 06/12/2016 16.62* 0.41 - 5.90 uIU/mL Final  Admission on 06/13/2016, Discharged on 06/13/2016  Component Date Value Ref Range Status  . Sodium 06/13/2016 138  135 - 145 mmol/L Final  . Potassium 06/13/2016 5.8* 3.5 - 5.1 mmol/L Final  . Chloride 06/13/2016 99* 101 - 111 mmol/L Final  . BUN 06/13/2016 84* 6 - 20 mg/dL Final  . Creatinine, Ser 06/13/2016 >18.00* 0.61 - 1.24 mg/dL Final  . Glucose, Bld 06/13/2016 83  65 - 99 mg/dL Final  . Calcium, Ion 06/13/2016 0.74* 1.15 - 1.40 mmol/L Final  . TCO2 06/13/2016 27  0 - 100 mmol/L Final  . Hemoglobin 06/13/2016 12.2* 13.0 - 17.0 g/dL Final  . HCT 06/13/2016 36.0* 39.0 - 52.0 % Final  . Comment 06/13/2016 NOTIFIED PHYSICIAN   Final  Admission on 05/27/2016, Discharged on 05/27/2016  Component Date Value Ref Range Status  . Sodium 05/27/2016 139  135 - 145 mmol/L Final  . Potassium 05/27/2016 4.6  3.5 - 5.1 mmol/L Final  . Chloride 05/27/2016 99* 101 - 111 mmol/L Final  . BUN 05/27/2016 58* 6 - 20 mg/dL Final  . Creatinine, Ser 05/27/2016 16.50* 0.61 - 1.24 mg/dL Final  . Glucose, Bld 05/27/2016 85  65 - 99 mg/dL Final  . Calcium, Ion 05/27/2016 0.84* 1.15 - 1.40 mmol/L Final  . TCO2 05/27/2016 27  0 - 100 mmol/L Final  . Hemoglobin 05/27/2016 11.9* 13.0 - 17.0 g/dL Final  . HCT 05/27/2016 35.0* 39.0 - 52.0 % Final  Nursing Home on 05/20/2016  Component Date Value Ref Range Status  . Hemoglobin 05/03/2016 10.3* 13.5 - 17.5 g/dL Final  . HCT 05/03/2016 33* 41 - 53 % Final  . Platelets 05/03/2016 227  150 - 399 K/L Final  . WBC 05/03/2016 6.9  10^3/mL Final  . Glucose 05/03/2016 140  mg/dL Final  . BUN 05/03/2016 98* 4 - 21 mg/dL Final  . Creatinine 05/03/2016 15.8* 0.6 - 1.3 mg/dL Final  . Potassium 05/03/2016 5.0  3.4 - 5.3 mmol/L Final  . Sodium 05/03/2016 141  137 - 147 mmol/L Final  . Triglycerides 05/03/2016 77  40 - 160 mg/dL Final  .  Cholesterol 05/03/2016 101  0 - 200 mg/dL Final  . HDL 05/03/2016 51  35 - 70 mg/dL Final  . LDL Cholesterol 05/03/2016 35  mg/dL Final  . TSH 05/03/2016 16.21* 0.41 - 5.90 uIU/mL Final    Ir Veno/ext/uni Right  Result Date: 07/17/2016 CLINICAL DATA:  End-stage renal disease on hemodialysis. Planned fistula ligation. Intermediate to long-term venous access  needed for hemodialysis. History of multiple venous occlusions. EXAM: 1. RIGHT UPPER EXTREMITY VENOGRAM 2. TUNNELED HEMODIALYSIS CATHETER PLACEMENT WITH ULTRASOUND AND FLUOROSCOPIC GUIDANCE 3. ULTRASOUND GUIDANCE FOR VASCULAR ACCESS TECHNIQUE: The procedure, risks, benefits, and alternatives were explained to the patient. Questions regarding the procedure were encouraged and answered. The patient understands and consents to the procedure. As antibiotic prophylaxis, vancomycin 1 g was ordered pre-procedure and administered intravenously within one hour of incision. Review of previous imaging revealed long segment occlusion of the left innominate vein. A patent right IJ vein could not be identified on survey ultrasound. The right external jugular vein was identified, which had been shown to give access to the SVC on the previous CT from 2016. Patency of the right external jugular vein was confirmed with ultrasound with image documentation. An appropriate skin site was determined. Region was prepped using maximum barrier technique including cap and mask, sterile gown, sterile gloves, large sterile sheet, and Chlorhexidine as cutaneous antisepsis. The region was infiltrated locally with 1% lidocaine. Under real-time ultrasound guidance, the right external jugular vein was accessed with a 21 gauge micropuncture needle; the needle tip within the vein was confirmed with ultrasound image documentation. Needle exchanged over the 018 guidewire for a 3 [place] microdilator, which allowed venography, demonstrating occlusion of the right UPJ vein centrally. There is  collateral reconstitution of the right axillary and subclavian vein. The subclavian vein tapers and occludes centrally. Body wall collateral strain the right upper extremity. Because of the tapered appearance of the right subclavian vein, recanalization was attempted. Under real-time ultrasound guidance, the right subclavian vein was accessed with a 21 gauge micropuncture needle; the needle tip within the vein was confirmed with ultrasound image documentation. Needle exchanged over the 018 guidewire for a 3 [place] microdilator, which allowed venography, demonstrating occlusion of the right subclavian vein centrally. The micro dilator was exchanged for the transitional dilator, which allowed advancement of the [facility] wire. The tract was dilated to the site allow advancement of an angled 5 French angiographic Kumpe the catheter. Central made with the aid of an angled Glidewire stiff Glidewire to traverse the central subclavian occlusion and gain access to the SVC. During these attempts, the Kumpe the catheter was exchanged for a 4 French angled glide catheter. Ultimately, the included segments could not be traversed. This required femoral access to be used. Patency of the right common femoral vein was confirmed with ultrasound with image documentation. An appropriate skin site was determined. Region was prepped using maximum barrier technique including cap and mask, sterile gown, sterile gloves, large sterile sheet, and Chlorhexidine as cutaneous antisepsis. The region was infiltrated locally with 1% lidocaine. Under real-time ultrasound guidance, the right common femoral vein was accessed with a 21 gauge micropuncture needle; the needle tip within the vein was confirmed with ultrasound image documentation. Needle exchanged over the 018 guidewire for transitional dilator which allowed advancement of the [facility] wire into the IVC. A 5 French angiographic catheter was placed and intravascular length was measured. A  Palindrome 44 cm hemodialysis catheter was tunneled from the right proximal anterior thigh approach to the right femoral dermatotomy site. The angiographic catheter was exchanged over an Amplatz wire for serial vascular dilators which allow placement of a peel-away sheath, through which the catheter was advanced under intermittent fluoroscopy, positioned with its tips at the inferior cavoatrial junction. Spot chest radiograph confirms good catheter position. No pneumothorax. Catheter was flushed and primed per protocol. Catheter secured externally with O Prolene sutures. The right femoral  dermatotomy site was closed with Dermabond. COMPLICATIONS: COMPLICATIONS None immediate FLUOROSCOPY TIME:  9 minutes, 308 uGym2 DAP COMPARISON:  06/02/2015 and previous IMPRESSION: 1. Technically successful placement of tunneled right femoral hemodialysis catheter with ultrasound and fluoroscopic guidance. Ready for routine use. 2. 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 Re- cannulization attempts to gain access to the SVC were not successful. ACCESS: There is no upper extremity central venous access. Future catheters will require femoral, trans lumbar, or other alternative access approaches. Electronically Signed   By: Lucrezia Europe M.D.   On: 07/17/2016 11:16   Ir Fluoro Guide Cv Line Right  Result Date: 07/17/2016 CLINICAL DATA:  End-stage renal disease on hemodialysis. Planned fistula ligation. Intermediate to long-term venous access needed for hemodialysis. History of multiple venous occlusions. EXAM: 1. RIGHT UPPER EXTREMITY VENOGRAM 2. TUNNELED HEMODIALYSIS CATHETER PLACEMENT WITH ULTRASOUND AND FLUOROSCOPIC GUIDANCE 3. ULTRASOUND GUIDANCE FOR VASCULAR ACCESS TECHNIQUE: The procedure, risks, benefits, and alternatives were explained to the patient. Questions regarding the procedure were encouraged and answered. The patient understands and consents to the procedure. As antibiotic prophylaxis, vancomycin 1  g was ordered pre-procedure and administered intravenously within one hour of incision. Review of previous imaging revealed long segment occlusion of the left innominate vein. A patent right IJ vein could not be identified on survey ultrasound. The right external jugular vein was identified, which had been shown to give access to the SVC on the previous CT from 2016. Patency of the right external jugular vein was confirmed with ultrasound with image documentation. An appropriate skin site was determined. Region was prepped using maximum barrier technique including cap and mask, sterile gown, sterile gloves, large sterile sheet, and Chlorhexidine as cutaneous antisepsis. The region was infiltrated locally with 1% lidocaine. Under real-time ultrasound guidance, the right external jugular vein was accessed with a 21 gauge micropuncture needle; the needle tip within the vein was confirmed with ultrasound image documentation. Needle exchanged over the 018 guidewire for a 3 [place] microdilator, which allowed venography, demonstrating occlusion of the right UPJ vein centrally. There is collateral reconstitution of the right axillary and subclavian vein. The subclavian vein tapers and occludes centrally. Body wall collateral strain the right upper extremity. Because of the tapered appearance of the right subclavian vein, recanalization was attempted. Under real-time ultrasound guidance, the right subclavian vein was accessed with a 21 gauge micropuncture needle; the needle tip within the vein was confirmed with ultrasound image documentation. Needle exchanged over the 018 guidewire for a 3 [place] microdilator, which allowed venography, demonstrating occlusion of the right subclavian vein centrally. The micro dilator was exchanged for the transitional dilator, which allowed advancement of the [facility] wire. The tract was dilated to the site allow advancement of an angled 5 French angiographic Kumpe the catheter. Central made  with the aid of an angled Glidewire stiff Glidewire to traverse the central subclavian occlusion and gain access to the SVC. During these attempts, the Kumpe the catheter was exchanged for a 4 French angled glide catheter. Ultimately, the included segments could not be traversed. This required femoral access to be used. Patency of the right common femoral vein was confirmed with ultrasound with image documentation. An appropriate skin site was determined. Region was prepped using maximum barrier technique including cap and mask, sterile gown, sterile gloves, large sterile sheet, and Chlorhexidine as cutaneous antisepsis. The region was infiltrated locally with 1% lidocaine. Under real-time ultrasound guidance, the right common femoral vein was accessed with a 21 gauge micropuncture needle; the  needle tip within the vein was confirmed with ultrasound image documentation. Needle exchanged over the 018 guidewire for transitional dilator which allowed advancement of the [facility] wire into the IVC. A 5 French angiographic catheter was placed and intravascular length was measured. A Palindrome 44 cm hemodialysis catheter was tunneled from the right proximal anterior thigh approach to the right femoral dermatotomy site. The angiographic catheter was exchanged over an Amplatz wire for serial vascular dilators which allow placement of a peel-away sheath, through which the catheter was advanced under intermittent fluoroscopy, positioned with its tips at the inferior cavoatrial junction. Spot chest radiograph confirms good catheter position. No pneumothorax. Catheter was flushed and primed per protocol. Catheter secured externally with O Prolene sutures. The right femoral dermatotomy site was closed with Dermabond. COMPLICATIONS: COMPLICATIONS None immediate FLUOROSCOPY TIME:  9 minutes, 308 uGym2 DAP COMPARISON:  06/02/2015 and previous IMPRESSION: 1. Technically successful placement of tunneled right femoral hemodialysis  catheter with ultrasound and fluoroscopic guidance. Ready for routine use. 2. 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 Re- cannulization attempts to gain access to the SVC were not successful. ACCESS: There is no upper extremity central venous access. Future catheters will require femoral, trans lumbar, or other alternative access approaches. Electronically Signed   By: Lucrezia Europe M.D.   On: 07/17/2016 11:16   Ir US Guide Vasc Access Right  Result Date: 07/17/2016 CLINICAL DATA:  End-stage renal disease on hemodialysis. Planned fistula ligation. Intermediate to long-term venous access needed for hemodialysis. History of multiple venous occlusions. EXAM: 1. RIGHT UPPER EXTREMITY VENOGRAM 2. TUNNELED HEMODIALYSIS CATHETER PLACEMENT WITH ULTRASOUND AND FLUOROSCOPIC GUIDANCE 3. ULTRASOUND GUIDANCE FOR VASCULAR ACCESS TECHNIQUE: The procedure, risks, benefits, and alternatives were explained to the patient. Questions regarding the procedure were encouraged and answered. The patient understands and consents to the procedure. As antibiotic prophylaxis, vancomycin 1 g was ordered pre-procedure and administered intravenously within one hour of incision. Review of previous imaging revealed long segment occlusion of the left innominate vein. A patent right IJ vein could not be identified on survey ultrasound. The right external jugular vein was identified, which had been shown to give access to the SVC on the previous CT from 2016. Patency of the right external jugular vein was confirmed with ultrasound with image documentation. An appropriate skin site was determined. Region was prepped using maximum barrier technique including cap and mask, sterile gown, sterile gloves, large sterile sheet, and Chlorhexidine as cutaneous antisepsis. The region was infiltrated locally with 1% lidocaine. Under real-time ultrasound guidance, the right external jugular vein was accessed with a 21 gauge micropuncture  needle; the needle tip within the vein was confirmed with ultrasound image documentation. Needle exchanged over the 018 guidewire for a 3 [place] microdilator, which allowed venography, demonstrating occlusion of the right UPJ vein centrally. There is collateral reconstitution of the right axillary and subclavian vein. The subclavian vein tapers and occludes centrally. Body wall collateral strain the right upper extremity. Because of the tapered appearance of the right subclavian vein, recanalization was attempted. Under real-time ultrasound guidance, the right subclavian vein was accessed with a 21 gauge micropuncture needle; the needle tip within the vein was confirmed with ultrasound image documentation. Needle exchanged over the 018 guidewire for a 3 [place] microdilator, which allowed venography, demonstrating occlusion of the right subclavian vein centrally. The micro dilator was exchanged for the transitional dilator, which allowed advancement of the [facility] wire. The tract was dilated to the site allow advancement of an angled 5 [place]  angiographic Kumpe the catheter. Central made with the aid of an angled Glidewire stiff Glidewire to traverse the central subclavian occlusion and gain access to the SVC. During these attempts, the Kumpe the catheter was exchanged for a 4 French angled glide catheter. Ultimately, the included segments could not be traversed. This required femoral access to be used. Patency of the right common femoral vein was confirmed with ultrasound with image documentation. An appropriate skin site was determined. Region was prepped using maximum barrier technique including cap and mask, sterile gown, sterile gloves, large sterile sheet, and Chlorhexidine as cutaneous antisepsis. The region was infiltrated locally with 1% lidocaine. Under real-time ultrasound guidance, the right common femoral vein was accessed with a 21 gauge micropuncture needle; the needle tip within the vein was confirmed  with ultrasound image documentation. Needle exchanged over the 018 guidewire for transitional dilator which allowed advancement of the [facility] wire into the IVC. A 5 French angiographic catheter was placed and intravascular length was measured. A Palindrome 44 cm hemodialysis catheter was tunneled from the right proximal anterior thigh approach to the right femoral dermatotomy site. The angiographic catheter was exchanged over an Amplatz wire for serial vascular dilators which allow placement of a peel-away sheath, through which the catheter was advanced under intermittent fluoroscopy, positioned with its tips at the inferior cavoatrial junction. Spot chest radiograph confirms good catheter position. No pneumothorax. Catheter was flushed and primed per protocol. Catheter secured externally with O Prolene sutures. The right femoral dermatotomy site was closed with Dermabond. COMPLICATIONS: COMPLICATIONS None immediate FLUOROSCOPY TIME:  9 minutes, 308 uGym2 DAP COMPARISON:  06/02/2015 and previous IMPRESSION: 1. Technically successful placement of tunneled right femoral hemodialysis catheter with ultrasound and fluoroscopic guidance. Ready for routine use. 2. 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 Re- cannulization attempts to gain access to the SVC were not successful. ACCESS: There is no upper extremity central venous access. Future catheters will require femoral, trans lumbar, or other alternative access approaches. Electronically Signed   By: Lucrezia Europe M.D.   On: 07/17/2016 11:16   Ir US Guide Vasc Access Right  Result Date: 07/17/2016 CLINICAL DATA:  End-stage renal disease on hemodialysis. Planned fistula ligation. Intermediate to long-term venous access needed for hemodialysis. History of multiple venous occlusions. EXAM: 1. RIGHT UPPER EXTREMITY VENOGRAM 2. TUNNELED HEMODIALYSIS CATHETER PLACEMENT WITH ULTRASOUND AND FLUOROSCOPIC GUIDANCE 3. ULTRASOUND GUIDANCE FOR VASCULAR ACCESS  TECHNIQUE: The procedure, risks, benefits, and alternatives were explained to the patient. Questions regarding the procedure were encouraged and answered. The patient understands and consents to the procedure. As antibiotic prophylaxis, vancomycin 1 g was ordered pre-procedure and administered intravenously within one hour of incision. Review of previous imaging revealed long segment occlusion of the left innominate vein. A patent right IJ vein could not be identified on survey ultrasound. The right external jugular vein was identified, which had been shown to give access to the SVC on the previous CT from 2016. Patency of the right external jugular vein was confirmed with ultrasound with image documentation. An appropriate skin site was determined. Region was prepped using maximum barrier technique including cap and mask, sterile gown, sterile gloves, large sterile sheet, and Chlorhexidine as cutaneous antisepsis. The region was infiltrated locally with 1% lidocaine. Under real-time ultrasound guidance, the right external jugular vein was accessed with a 21 gauge micropuncture needle; the needle tip within the vein was confirmed with ultrasound image documentation. Needle exchanged over the 018 guidewire for a 3 [place] microdilator, which allowed venography,  demonstrating occlusion of the right UPJ vein centrally. There is collateral reconstitution of the right axillary and subclavian vein. The subclavian vein tapers and occludes centrally. Body wall collateral strain the right upper extremity. Because of the tapered appearance of the right subclavian vein, recanalization was attempted. Under real-time ultrasound guidance, the right subclavian vein was accessed with a 21 gauge micropuncture needle; the needle tip within the vein was confirmed with ultrasound image documentation. Needle exchanged over the 018 guidewire for a 3 [place] microdilator, which allowed venography, demonstrating occlusion of the right  subclavian vein centrally. The micro dilator was exchanged for the transitional dilator, which allowed advancement of the [facility] wire. The tract was dilated to the site allow advancement of an angled 5 French angiographic Kumpe the catheter. Central made with the aid of an angled Glidewire stiff Glidewire to traverse the central subclavian occlusion and gain access to the SVC. During these attempts, the Kumpe the catheter was exchanged for a 4 French angled glide catheter. Ultimately, the included segments could not be traversed. This required femoral access to be used. Patency of the right common femoral vein was confirmed with ultrasound with image documentation. An appropriate skin site was determined. Region was prepped using maximum barrier technique including cap and mask, sterile gown, sterile gloves, large sterile sheet, and Chlorhexidine as cutaneous antisepsis. The region was infiltrated locally with 1% lidocaine. Under real-time ultrasound guidance, the right common femoral vein was accessed with a 21 gauge micropuncture needle; the needle tip within the vein was confirmed with ultrasound image documentation. Needle exchanged over the 018 guidewire for transitional dilator which allowed advancement of the [facility] wire into the IVC. A 5 French angiographic catheter was placed and intravascular length was measured. A Palindrome 44 cm hemodialysis catheter was tunneled from the right proximal anterior thigh approach to the right femoral dermatotomy site. The angiographic catheter was exchanged over an Amplatz wire for serial vascular dilators which allow placement of a peel-away sheath, through which the catheter was advanced under intermittent fluoroscopy, positioned with its tips at the inferior cavoatrial junction. Spot chest radiograph confirms good catheter position. No pneumothorax. Catheter was flushed and primed per protocol. Catheter secured externally with O Prolene sutures. The right femoral  dermatotomy site was closed with Dermabond. COMPLICATIONS: COMPLICATIONS None immediate FLUOROSCOPY TIME:  9 minutes, 308 uGym2 DAP COMPARISON:  06/02/2015 and previous IMPRESSION: 1. Technically successful placement of tunneled right femoral hemodialysis catheter with ultrasound and fluoroscopic guidance. Ready for routine use. 2. 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 Re- cannulization attempts to gain access to the SVC were not successful. ACCESS: There is no upper extremity central venous access. Future catheters will require femoral, trans lumbar, or other alternative access approaches. Electronically Signed   By: Lucrezia Europe M.D.   On: 07/17/2016 11:16   Dg Chest Port 1 View  Result Date: 07/17/2016 CLINICAL DATA:  Pulmonary edema, history COPD, diabetes mellitus, asthma, coronary artery disease, end-stage renal disease on dialysis EXAM: PORTABLE CHEST 1 VIEW COMPARISON:  Portable exam 0450 hours compared to 07/16/2016 FINDINGS: Minimal enlargement of cardiac silhouette. Mediastinal contours and pulmonary vascularity normal. Lungs now clear. No pleural effusion or pneumothorax. No significant osseous findings. IMPRESSION: No acute abnormalities. Pulmonary edema seen on previous exam improved. Electronically Signed   By: Lavonia Dana M.D.   On: 07/17/2016 07:39   Dg Chest Portable 1 View  Result Date: 07/16/2016 CLINICAL DATA:  Hypoxia EXAM: PORTABLE CHEST 1 VIEW COMPARISON:  06/13/2016 FINDINGS: Diffuse interstitial  opacity with occasional Kerley lines. No pleural effusion. Chronic cardiopericardial enlargement. Left brachiocephalic stent again noted. IMPRESSION: Mild pulmonary edema. Electronically Signed   By: Monte Fantasia M.D.   On: 07/16/2016 15:48     Assessment/Plan   ICD-9-CM ICD-10-CM   1. Noncompliance with renal dialysis ([place]) V45.12 Z91.15   2. Chronic diastolic heart failure (HCC) 428.32 I50.32   3. ESRD on hemodialysis (HCC) 585.6 N18.6    V45.11 Z99.2   4.  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 Chronic a-fib (HCC) 427.31 I48.2   6. 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 Anemia of chronic renal failure, stage 5 (HCC) 285.21 N18.5    585.5 D63.1   8. Bipolar depression ([place]) 296.50 F31.30   9. Hypothyroidism due to amiodarone 244.3 T46.2X1A     E03.2   10. COPD, moderate ([place]) 496 J44.9   11. Benign hypertension with end-stage renal disease (HCC) 403.11 I12.0    585.6 N18.6     Cont current meds as ordered  Diet compliance discussed and highly urged. 1200 cc per day fluid restriction due to HD  Need to check A1c  F/u with HD as scheduled. Discussed importance of being compliant with HD  F/u with specialists as scheduled  Follow CBGs as ordered  GOAL: long term care. Communicated with pt and nursing.  Will follow  Piedad Standiford S. Perlie Gold  [facility] [address] [place], [place] [address] [phone] Cell (Monday-Friday 8 AM - 5 PM) [phone] After 5 PM and follow prompts

## 2016-07-31 ENCOUNTER — Encounter: Payer: Self-pay | Admitting: Adult Health

## 2016-07-31 ENCOUNTER — Non-Acute Institutional Stay (SKILLED_NURSING_FACILITY): Payer: Medicare Other | Admitting: Adult Health

## 2016-07-31 DIAGNOSIS — T462X1A Poisoning by other antidysrhythmic drugs, accidental (unintentional), initial encounter: Secondary | ICD-10-CM | POA: Diagnosis not present

## 2016-07-31 DIAGNOSIS — E032 Hypothyroidism due to medicaments and other exogenous substances: Secondary | ICD-10-CM

## 2016-07-31 NOTE — Progress Notes (Signed)
Patient ID: Noah Lewis, male   DOB: [birth year], 65 y.o.   MRN: [id number]   Location:   [facility] Room Number: 215-B Place of Service:  SNF (31)   CODE STATUS: Full Code  Allergies  Allergen Reactions  . Penicillins Other (See Comments)    Reaction:  Unknown     Chief Complaint  Patient presents with  . Acute Visit    Thyroid follow up    HPI:    Past Medical History:  Diagnosis Date  . Arthritis   . Asthma   . Cancer ([place])   . COPD (chronic obstructive pulmonary disease) ([place])   . Coronary artery disease   . Diabetes mellitus without complication ([place])   . Hypertension   . Renal disorder     Past Surgical History:  Procedure Laterality Date  . AV FISTULA PLACEMENT Left 07/22/2016   Procedure: INSERTION OF ARTERIOVENOUS (AV) LOOP GRAFT LEFT THIGH;  Surgeon: Waynetta Sandy, MD;  Location: [place];  Service: Vascular;  Laterality: Left;  . INSERTION OF DIALYSIS CATHETER    . INSERTION OF DIALYSIS CATHETER N/A 05/29/2015   Procedure: INSERTION OF DIALYSIS CATHETER;  Surgeon: Angelia Mould, MD;  Location: [facility];  Service: Vascular;  Laterality: N/A;  . IR GENERIC HISTORICAL  07/16/2016   IR US GUIDE VASC ACCESS RIGHT 07/16/2016 Arne Cleveland, MD [facility]  . IR GENERIC HISTORICAL  07/16/2016   IR FLUORO GUIDE CV LINE RIGHT 07/16/2016 Arne Cleveland, MD [facility]  . IR GENERIC HISTORICAL  07/16/2016   IR US GUIDE VASC ACCESS RIGHT 07/16/2016 Arne Cleveland, MD [facility]  . IR GENERIC HISTORICAL  07/16/2016   IR VENO/EXT/UNI RIGHT 07/16/2016 Arne Cleveland, MD [facility]  . LIGATION OF ARTERIOVENOUS  FISTULA Left 07/17/2016   Procedure: LIGATION OF BRACHIOCEPHALIC ARTERIOVENOUS  FISTULA  Left arm;  Surgeon: Rosetta Posner, MD;  Location: [facility];  Service: Vascular;  Laterality: Left;  . NO PAST SURGERIES    . PERIPHERAL VASCULAR CATHETERIZATION N/A 10/25/2015   Procedure: Dialysis/Perma Catheter Insertion;  Surgeon: Katha Cabal, MD;  Location: [place] CV [facility];  Service: Cardiovascular;  Laterality: N/A;  . PERIPHERAL VASCULAR CATHETERIZATION Left 02/22/2016   Procedure: A/V Shuntogram/Fistulagram;  Surgeon: Algernon Huxley, MD;  Location: [facility];  Service: Cardiovascular;  Laterality: Left;  . PERIPHERAL VASCULAR CATHETERIZATION Right 03/19/2016   Procedure: Dialysis/Perma Catheter Insertion;  Surgeon: Katha Cabal, MD;  Location: [place] [facility];  Service: Cardiovascular;  Laterality: Right;  . PERIPHERAL VASCULAR CATHETERIZATION N/A 04/22/2016   Procedure: Dialysis/Perma Catheter Removal;  Surgeon: Algernon Huxley, MD;  Location: [facility];  Service: Cardiovascular;  Laterality: N/A;    Social History   Social History  . Marital status: Legally Separated    Spouse name: N/A  . Number of children: N/A  . Years of education: N/A   Occupational History  . Not on file.   Social History Main Topics  . Smoking status: Never Smoker  . Smokeless tobacco: Never Used  . Alcohol use No  . Drug use:     Types: Marijuana     Comment: Occasionally  . Sexual activity: Not on file   Other Topics Concern  . Not on file   Social History Narrative  . No narrative on file   Family History  Problem Relation Age of Onset  . Hypertension Other   . Diabetes Other       VITAL  SIGNS BP 98/65   Pulse 76   Temp 98.6 F (37 C) (Oral)   Resp 18   Ht 5\' 7"  (1.702 m)   Wt 200 lb 2 oz (90.8 kg)   SpO2 98%   BMI 31.34 kg/m   Patient's Medications  New Prescriptions   No medications on file  Previous Medications   ACETAMINOPHEN (TYLENOL) 325 MG TABLET    Take 650 mg by mouth every 6 (six) hours as needed for mild pain, fever or headache.   AMIODARONE (PACERONE) 400 MG TABLET    Take 1 tablet (400 mg total) by mouth daily.   ASPIRIN 81 MG CHEWABLE TABLET    Chew 1 tablet (81 mg total) by mouth daily.   B COMPLEX-VITAMIN C-FOLIC ACID (NEPHRO-VITE) 0.8 MG TABS TABLET    Take 1  tablet by mouth 2 (two) times daily.   CALCITRIOL (ROCALTROL) 0.5 MCG CAPSULE    Take 1 capsule (0.5 mcg total) by mouth every other day.   CAPSAICIN (CAPSAGEL) 0.025 % GEL    Apply 1 application topically 2 (two) times daily as needed (for lower back pain).   IPRATROPIUM-ALBUTEROL (DUONEB) 0.5-2.5 (3) MG/3ML SOLN    Take 3 mLs by nebulization every 6 (six) hours as needed (for wheezing/shortness of breath).   LAMOTRIGINE (LAMICTAL) 25 MG TABLET    Take 2 tablets (50 mg total) by mouth daily.   LEVOTHYROXINE (SYNTHROID, LEVOTHROID) 75 MCG TABLET    Take 75 mcg by mouth daily before breakfast.    LORATADINE (CLARITIN) 10 MG TABLET    Take 1 tablet (10 mg total) by mouth daily.   METOPROLOL TARTRATE (LOPRESSOR) 25 MG TABLET    Take 0.5 tablets (12.5 mg total) by mouth 2 (two) times daily.   MIRTAZAPINE (REMERON SOL-TAB) 15 MG DISINTEGRATING TABLET    Take 1 tablet (15 mg total) by mouth at bedtime.   POLYETHYLENE GLYCOL (MIRALAX / GLYCOLAX) PACKET    Take 17 g by mouth daily as needed for mild constipation.    SACCHAROMYCES BOULARDII (FLORASTOR) 250 MG CAPSULE    Take 250 mg by mouth 2 (two) times daily.   SEVELAMER (RENAGEL) 800 MG TABLET    Take 2,400 mg by mouth 3 (three) times daily with meals.   TRAVOPROST, BAK FREE, (TRAVATAN) 0.004 % SOLN OPHTHALMIC SOLUTION    Place 1 drop into both eyes at bedtime.   WARFARIN (COUMADIN) 5 MG TABLET    Take 1 tablet (5 mg total) by mouth one time only at 6 PM.  Modified Medications   No medications on file  Discontinued Medications   No medications on file     SIGNIFICANT DIAGNOSTIC EXAMS  05-31-15: ct of chest: 1. 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 2. Soft tissue swelling in the LEFT chest wall likely related to venous occlusion. There is prominent adenopathy in LEFT axilla and sub pectoralis location. Presumably this relates to venous occlusion. Cannot exclude a malignant process but is felt less likely. The adenopathy and swelling is  new from 08/15/2014 3. Central venous line from a inferior approach with tip is in the RIGHT atrium and distal SVC. 4. Diffuse ground-glass opacities not changed from prior. 5. Resolution of LEFT upper lobe pulmonary nodule.  Ureteral   06-02-15: left upper arm shuntogram: Status post left upper extremity fistulagram demonstrating occluded stent in the subclavian vein at the level of the left clavicular head, as a cause for increased venous pressure within the left upper extremity.  12-05-15:  2-d echo: - Left ventricle: The cavity size was normal. There was mild concentric hypertrophy. Systolic function was normal. The estimated ejection fraction was in the range of 50% to 55%. Regional wall motion abnormalities cannot be excluded. The study is not technically sufficient to allow evaluation of LV diastolic function. - Aortic valve: There was mild regurgitation. - Mitral valve: There was mild regurgitation. - Left atrium: The atrium was mildly to moderately dilated. - Right ventricle: Systolic function was mildly reduced. - Pulmonary arteries: Systolic pressure was mild to moderately elevated. - Inferior vena cava: The vessel was normal in size.   07-16-16: chest x-ray: Mild pulmonary edema  07-17-16: chest x-ray: No acute abnormalities. Pulmonary edema seen on previous exam improved.   LABS REVIEWED:   05-03-16: wbc 6.9; hgb 10.3; hct 33.2; mcv 90.7 plt 227; glucose 140; bun 98.3; creat 15.76; k+ 5.0; na++ 141; liver normal albumin 3.9; chol 101; ldl 35; trig 77; hdl 51; tsh 16.21; mag 2.4; phos 7.0  06-12-16: wbc 6.3; hgb 10.9; hct 33.6; mcv 88.4; plt 184; glucose 82; bun 59.6; creat 15.00; k+ 5.4; na++ 142; liver normal albumin 4.3 TSH 16.62 07-16-16: wbc 8.2; hgb 11.9; hct 37.3; mcv 87.8; plt 205; glucose 110; bun 82; creat 17.91; k+ 5.7; na++ 138; liver normal albumin 3.9 07-22-16: wbc 9.6; hgb 12.0; hct 36.4 ;mcv 86.1; plt 201; glucose 96; bun 49;creat 10.21; k+ 4.7; na++ 134; phos 5.8;  albumin 3.6  07-30-16: tsh 11.77    Review of Systems Constitutional: Negative for appetite change and fatigue.  Respiratory: Negative for cough, chest tightness and shortness of breath.   Cardiovascular: Negative for chest pain and leg swelling.  Gastrointestinal: Negative for abdominal pain and constipation.  Musculoskeletal: no complaint of pain  Skin: Negative for pallor.  Psychiatric/Behavioral: The patient is not nervous/anxious.     Physical Exam Vitals reviewed. Constitutional: No distress.  Overweight   Eyes: Conjunctivae are normal.  Neck: Neck supple. No JVD present. No thyromegaly present.  Cardiovascular: Normal rate, regular rhythm and intact distal pulses.   Respiratory: has few scattered wheezes .  GI: Soft. Bowel sounds are normal. He exhibits no distension. There is no tenderness.  Musculoskeletal: He exhibits no edema.  Able to move all extremities   Lymphadenopathy:    He has no cervical adenopathy.  Neurological: He is alert.  Skin: Skin is warm and dry. He is not diaphoretic.  Left thigh A/V graft placed 07-22-16.   Psychiatric: He has a normal mood and affect.     ASSESSMENT/ PLAN:  4. Hypothyroidism: tsh is 11.77; will increase synthroid to 100 mcg daily and will repeat thyroid labs in one month.      MD is aware of resident's narcotic use and is in agreement with current plan of care. We will attempt to wean resident as apropriate   Ok Edwards NP [facility]  Contact [phone] Monday through Friday 8am- 5pm  After hours call [phone]

## 2016-08-08 ENCOUNTER — Encounter: Payer: Self-pay | Admitting: Vascular Surgery

## 2016-08-09 ENCOUNTER — Encounter: Payer: Self-pay | Admitting: Vascular Surgery

## 2016-08-09 ENCOUNTER — Ambulatory Visit (INDEPENDENT_AMBULATORY_CARE_PROVIDER_SITE_OTHER): Payer: Medicare Other | Admitting: Vascular Surgery

## 2016-08-09 VITALS — BP 102/69 | HR 94 | Temp 97.5°F | Resp 16 | Ht 67.0 in | Wt 200.0 lb

## 2016-08-09 DIAGNOSIS — N186 End stage renal disease: Secondary | ICD-10-CM

## 2016-08-09 DIAGNOSIS — Z992 Dependence on renal dialysis: Secondary | ICD-10-CM

## 2016-08-09 NOTE — Progress Notes (Signed)
Patient name: Noah Lewis MRN: [id number] DOB: [birth year] Sex: male  REASON FOR VISIT: post-op   HPI: Noah Lewis is a 65 y.o. male who is s/p left femoral AV loop graft on 07/22/16. He is currently dialyzing via a right femoral catheter on Tuesdays, Thursdays and Saturdays. He previously presented with swelling of his left arm and underwent ligation of his left arm AV fistula. The patient denies any pain with his left thigh graft. He denies any pain with his left foot or leg. He does complain of itching around the left thigh incision.   Current Outpatient Prescriptions  Medication Sig Dispense Refill  . acetaminophen (TYLENOL) 325 MG tablet Take 650 mg by mouth every 6 (six) hours as needed for mild pain, fever or headache.    Marland Kitchen amiodarone (PACERONE) 400 MG tablet Take 1 tablet (400 mg total) by mouth daily. 30 tablet 0  . aspirin 81 MG chewable tablet Chew 1 tablet (81 mg total) by mouth daily. 30 tablet 11  . b complex-vitamin c-folic acid (NEPHRO-VITE) 0.8 MG TABS tablet Take 1 tablet by mouth 2 (two) times daily.    . calcitRIOL (ROCALTROL) 0.5 MCG capsule Take 1 capsule (0.5 mcg total) by mouth every other day.    . Capsaicin (CAPSAGEL) 0.025 % GEL Apply 1 application topically 2 (two) times daily as needed (for lower back pain).    Marland Kitchen ipratropium-albuterol (DUONEB) 0.5-2.5 (3) MG/3ML SOLN Take 3 mLs by nebulization every 6 (six) hours as needed (for wheezing/shortness of breath).    . lamoTRIgine (LAMICTAL) 25 MG tablet Take 2 tablets (50 mg total) by mouth daily. 2 tablet 0  . levothyroxine (SYNTHROID, LEVOTHROID) 75 MCG tablet Take 75 mcg by mouth daily before breakfast.     . loratadine (CLARITIN) 10 MG tablet Take 1 tablet (10 mg total) by mouth daily. 30 tablet 11  . metoprolol tartrate (LOPRESSOR) 25 MG tablet Take 0.5 tablets (12.5 mg total) by mouth 2 (two) times daily.    . mirtazapine (REMERON SOL-TAB) 15 MG disintegrating tablet Take 1 tablet (15 mg total) by mouth at  bedtime. 2 tablet 0  . polyethylene glycol (MIRALAX / GLYCOLAX) packet Take 17 g by mouth daily as needed for mild constipation.     . saccharomyces boulardii (FLORASTOR) 250 MG capsule Take 250 mg by mouth 2 (two) times daily.    . sevelamer (RENAGEL) 800 MG tablet Take 2,400 mg by mouth 3 (three) times daily with meals.    . Travoprost, BAK Free, (TRAVATAN) 0.004 % SOLN ophthalmic solution Place 1 drop into both eyes at bedtime.    Marland Kitchen warfarin (COUMADIN) 5 MG tablet Take 1 tablet (5 mg total) by mouth one time only at 6 PM. 7 tablet 0   No current facility-administered medications for this visit.     REVIEW OF SYSTEMS:  [X]  denotes positive finding, [ ]  denotes negative finding Cardiac  Comments:  Chest pain or chest pressure:    Shortness of breath upon exertion:    Short of breath when lying flat:    Irregular heart rhythm:    Constitutional    Fever or chills:      PHYSICAL EXAM: Vitals:   08/09/16 1352  BP: 102/69  Pulse: 94  Resp: 16  Temp: 97.5 F (36.4 C)  TempSrc: Oral  SpO2: 94%  Weight: 200 lb (90.7 kg)  Height: 5\' 7"  (1.702 m)    GENERAL: The patient is a well-nourished male, in no acute distress. The vital signs  are documented above. VASCULAR: right femoral catheter in place, left thigh graft with palpable thrill lateral aspect. Incisions healing well. Audible bruit throughout graft.   MEDICAL ISSUES: Status left femoral AV loop graft 07/22/16  Incisions healing well. Graft is patent. No steal syndrome left leg. Ok to cannulate 08/24/16. Follow up prn.   Virgina Jock, PA-C [facility]   I have interviewed the patient with PA and agree with the above findings and plan. If there are issues with cannulation please have him follow up in our office we will otherwise see him on an as-needed basis.  Moniqua Engebretsen C. Donzetta Matters, MD [facility] Office: [phone] Pager: [phone]

## 2016-08-27 ENCOUNTER — Non-Acute Institutional Stay (SKILLED_NURSING_FACILITY): Payer: Medicare Other | Admitting: Internal Medicine

## 2016-08-27 ENCOUNTER — Encounter: Payer: Self-pay | Admitting: Internal Medicine

## 2016-08-27 DIAGNOSIS — F319 Bipolar disorder, unspecified: Secondary | ICD-10-CM

## 2016-08-27 DIAGNOSIS — I482 Chronic atrial fibrillation, unspecified: Secondary | ICD-10-CM

## 2016-08-27 DIAGNOSIS — F3130 Bipolar disorder, current episode depressed, mild or moderate severity, unspecified: Secondary | ICD-10-CM | POA: Diagnosis not present

## 2016-08-27 DIAGNOSIS — I120 Hypertensive chronic kidney disease with stage 5 chronic kidney disease or end stage renal disease: Secondary | ICD-10-CM | POA: Diagnosis not present

## 2016-08-27 DIAGNOSIS — E1122 Type 2 diabetes mellitus with diabetic chronic kidney disease: Secondary | ICD-10-CM | POA: Diagnosis not present

## 2016-08-27 DIAGNOSIS — J449 Chronic obstructive pulmonary disease, unspecified: Secondary | ICD-10-CM | POA: Diagnosis not present

## 2016-08-27 DIAGNOSIS — N186 End stage renal disease: Secondary | ICD-10-CM | POA: Diagnosis not present

## 2016-08-27 DIAGNOSIS — Z992 Dependence on renal dialysis: Secondary | ICD-10-CM | POA: Diagnosis not present

## 2016-08-27 NOTE — Progress Notes (Signed)
Patient ID: Noah Lewis, male   DOB: 03-02-1951, 65 y.o.   MRN: [id number]   Location:  [facility] Room Number: 215-B Place of Service:  SNF (31) Provider:  Granville Lewis, PA-C  Elyse Jarvis, MD  Patient Care Team: Theotis Burrow, MD as PCP - General (Family Medicine)  Extended Emergency Contact Information Primary Emergency Contact: Hayes,Barbara Address: [address] [place], [place] [address] [place] Phone: [phone] Relation: Mother Secondary Emergency Contact: Hayes,[name] Address: [address]          [place], [place] [address] [place] Phone: [phone] Relation: Brother  Code Status:  Full Code Goals of care: Advanced Directive information Advanced Directives 08/27/2016  Does Patient Have a Medical Advance Directive? No  Type of Advance Directive -  Does patient want to make changes to medical advance directive? -  Copy of [place] in Chart? -  Would patient like information on creating a medical advance directive? -     Chief Complaint  Patient presents with  . Medical Management of Chronic Issues    Follow up  Chronic medical issues including end-stage renal disease on dialysis-chronic diastolic CHF-anemia of chronic kidney disease-hypothyroidism-COPD-chronic A. fib-coronary artery disease-bipolar disorder  HPI:  Pt is a 65 y.o. male seen today for medical management of chronic diseases.  As noted above.  He is complaining of diarrhea today he tripped stent to one of his dialysis medications which was recently started-he is not complaining of any nausea vomiting or abdominal discomfort.  He did refuse to go to dialysis today apparently because of the diarrhea.  In regards to his other medical conditions he does have a history of chronic diastolic CHF and at times has been hospitalized for pulmonary edema. He was recently hospitalized  proximally a month ago for pulmonary edema he had missed hemodialysis prior to admission.  He did require a left thigh AVG placement on October 30-had a temporary HD cath placed until he can use a permanent AVG in his left thigh.  He also was started on Coumadin for atrial fibrillation with go INR 2-2 0.5Eliquis doxycycline and Remeron were stopped.  He currently 0 receiving dialysis from his right femoral catheter.  He is not complaining of any shortness of breath or chest pain today or abdominal pain again main complaint is diarrhea.  Patient's Synthroid was recently increased secondary to an elevated TSH and update TSH is pending.  His atrial fibrillation appears rate controlled on amiodarone as well as Lopressor again he is on Coumadin for anticoagulation last INR was therapeutic update INR has been ordered.  He does have a history of bipolar disorder and depression at this point his mood appears to be stable although apparently has some mood fluctuations-continues on Remeron as well as Lamictal he does have a history of suicide idealization.         Past Medical History:  Diagnosis Date  . Arthritis   . Asthma   . Cancer ([place])   . COPD (chronic obstructive pulmonary disease) ([place])   . Coronary artery disease   . Diabetes mellitus without complication ([place])   . Hypertension   . Renal disorder    Past Surgical History:  Procedure Laterality Date  . AV FISTULA PLACEMENT Left 07/22/2016   Procedure: INSERTION OF ARTERIOVENOUS (AV) LOOP GRAFT LEFT THIGH;  Surgeon: Waynetta Sandy, MD;  Location:  [facility];  Service: Vascular;  Laterality: Left;  . INSERTION OF DIALYSIS CATHETER    . INSERTION OF DIALYSIS CATHETER N/A 05/29/2015   Procedure: INSERTION OF DIALYSIS CATHETER;  Surgeon: Angelia Mould, MD;  Location: [facility];  Service: Vascular;  Laterality: N/A;  . IR GENERIC HISTORICAL  07/16/2016   IR US GUIDE VASC ACCESS RIGHT 07/16/2016 Arne Cleveland, MD [facility]  . IR GENERIC HISTORICAL  07/16/2016   IR FLUORO GUIDE CV LINE RIGHT 07/16/2016 Arne Cleveland, MD [facility]  . IR GENERIC HISTORICAL  07/16/2016   IR US GUIDE VASC ACCESS RIGHT 07/16/2016 Arne Cleveland, MD [facility]  . IR GENERIC HISTORICAL  07/16/2016   IR VENO/EXT/UNI RIGHT 07/16/2016 Arne Cleveland, MD [facility]  . LIGATION OF ARTERIOVENOUS  FISTULA Left 07/17/2016   Procedure: LIGATION OF BRACHIOCEPHALIC ARTERIOVENOUS  FISTULA  Left arm;  Surgeon: Rosetta Posner, MD;  Location: [facility];  Service: Vascular;  Laterality: Left;  . NO PAST SURGERIES    . PERIPHERAL VASCULAR CATHETERIZATION N/A 10/25/2015   Procedure: Dialysis/Perma Catheter Insertion;  Surgeon: Katha Cabal, MD;  Location: [facility];  Service: Cardiovascular;  Laterality: N/A;  . PERIPHERAL VASCULAR CATHETERIZATION Left 02/22/2016   Procedure: A/V Shuntogram/Fistulagram;  Surgeon: Algernon Huxley, MD;  Location: [facility];  Service: Cardiovascular;  Laterality: Left;  . PERIPHERAL VASCULAR CATHETERIZATION Right 03/19/2016   Procedure: Dialysis/Perma Catheter Insertion;  Surgeon: Katha Cabal, MD;  Location: [facility];  Service: Cardiovascular;  Laterality: Right;  . PERIPHERAL VASCULAR CATHETERIZATION N/A 04/22/2016   Procedure: Dialysis/Perma Catheter Removal;  Surgeon: Algernon Huxley, MD;  Location: [facility];  Service: Cardiovascular;  Laterality: N/A;    Allergies  Allergen Reactions  . Penicillins Other (See Comments)    Reaction:  Unknown       Medication List       Accurate as of 08/27/16  1:06 PM. Always use your most recent med list.          acetaminophen 325 MG tablet Commonly known as:  TYLENOL Take 650 mg by mouth every 6 (six) hours as needed for mild pain, fever or headache.   amiodarone 400 MG tablet Commonly known as:  PACERONE Take 1 tablet (400 mg total) by mouth daily.   aspirin 81 MG chewable tablet Chew 1 tablet (81 mg total) by  mouth daily.   b complex-vitamin c-folic acid 0.8 MG Tabs tablet Take 1 tablet by mouth 2 (two) times daily.   calcitRIOL 0.5 MCG capsule Commonly known as:  ROCALTROL Take 1 capsule (0.5 mcg total) by mouth every other day.   CAPSAGEL 0.025 % Gel Generic drug:  Capsaicin Apply 1 application topically 2 (two) times daily as needed (for lower back pain).   ipratropium-albuterol 0.5-2.5 (3) MG/3ML Soln Commonly known as:  DUONEB Take 3 mLs by nebulization every 6 (six) hours as needed (for wheezing/shortness of breath).   lamoTRIgine 25 MG tablet Commonly known as:  LAMICTAL Take 2 tablets (50 mg total) by mouth daily.   levothyroxine 100 MCG tablet Commonly known as:  SYNTHROID, LEVOTHROID Take 100 mcg by mouth daily before breakfast.   loratadine 10 MG tablet Commonly known as:  CLARITIN Take 1 tablet (10 mg total) by mouth daily.   metoprolol tartrate 25 MG tablet Commonly known as:  LOPRESSOR Take 0.5 tablets (12.5 mg total) by mouth 2 (two) times daily.   mirtazapine 15 MG disintegrating tablet Commonly known  as:  REMERON SOL-TAB Take 1 tablet (15 mg total) by mouth at bedtime.   polyethylene glycol packet Commonly known as:  MIRALAX / GLYCOLAX Take 17 g by mouth daily as needed for mild constipation.   saccharomyces boulardii 250 MG capsule Commonly known as:  FLORASTOR Take 250 mg by mouth 2 (two) times daily.   Travoprost (BAK Free) 0.004 % Soln ophthalmic solution Commonly known as:  TRAVATAN Place 1 drop into both eyes at bedtime.   VELPHORO 500 MG chewable tablet Generic drug:  sucroferric oxyhydroxide Chew 1,000 mg by mouth 3 (three) times daily with meals. May also give 500 mg by mouth as needed   warfarin 7.5 MG tablet Commonly known as:  COUMADIN Take 7.5 mg by mouth daily.       Review of Systems  Limited secondary to patient not very verbal  Also provided by nursing staff  Constitutional: Negative for appetite change and fatigue.    Respiratory: Negative for cough, chest tightness and shortness of breath.   Cardiovascular: Negative for chest pain and leg swelling.  Gastrointestinal: Negative for abdominal pain and constipation however he is complaining of diarrhea.  Musculoskeletal: no complaint of pain  Skin: Negative for pallor.  Psychiatric/Behavioral: The patient is not nervous/anxious.   Immunization History  Administered Date(s) Administered  . PPD Test 05/01/2016   Pertinent  Health Maintenance Due  Topic Date Due  . FOOT EXAM  07/08/1961  . OPHTHALMOLOGY EXAM  07/08/1961  . URINE MICROALBUMIN  07/08/1961  . COLONOSCOPY  07/08/2001  . HEMOGLOBIN A1C  10/29/2015  . PNA vac Low Risk Adult (1 of 2 - PCV13) 07/08/2016  . INFLUENZA VACCINE  07/25/2017 (Originally 04/23/2016)   No flowsheet data found. Functional Status Survey:    Vitals:   08/27/16 1252  BP: (!) 122/91  Pulse: 76  Resp: 18  Temp: 97.4 F (36.3 C)  TempSrc: Oral  SpO2: 98%  Weight: 196 lb (88.9 kg)  Height: 5\' 7"  (1.702 m)   Body mass index is 30.7 kg/m. Physical Exam   Constitutional: No distress. Sitting comfortably in his wheelchair about to eat lunch  Overweight   Eyes: Conjunctivae are normal.  Neck: Neck supple. No JVD present. No thyromegaly present.  Cardiovascular: Normal rate, regular,irregular  rhythm and intact distal pulses.   Respiratory: has  scattered wheezes and bronchial sounds. --There is no labored breathing GI: Soft. Bowel sounds are normal. There is no tenderness. Abdomen is soft-it is obese  Musculoskeletal: He exhibits no edema.  Able to move all extremities   Lymphadenopathy:    He has no cervical adenopathy.  Neurological: He is alert.  Skin: Skin is warm and dry. He is not diaphoretic.   has right femoral catheter in place-left thigh graft was not assessed secondary to patient positioning   Psychiatric: He has a normal mood and affect. Pleasant and cooperative    Labs reviewed:  Recent  Labs  12/10/15 0702  12/12/15 0729  07/17/16 0815 07/20/16 1720 07/20/16 1721 07/22/16 0603  NA 130*  < >  --   < > 136 130* 130* 134*  K 4.6  --  4.1  < > 4.7 3.2* 3.2* 4.7  CL 94*  --   --   < > 95* 92* 93* 95*  CO2 21*  --   --   < > 26 25 25 24   GLUCOSE 84  --   --   < > 81 191* 189* 96  BUN 66*  --   --   < >  37* 14 14 49*  CREATININE 9.71*  --   --   < > 11.27* 5.17* 5.28* 10.21*  CALCIUM 7.5*  --   --   < > 8.3* 8.5* 8.6* 9.3  MG 2.6*  --  2.3  --   --   --   --   --   PHOS  --   --   --   < > 7.1*  --  3.6 5.8*  < > = values in this interval not displayed.  Recent Labs  12/04/15 1544  03/16/16 2350 06/12/16 07/16/16 1417 07/17/16 0815 07/20/16 1721 07/22/16 0603  AST 33  --  33 15 18  --   --   --   ALT 24  --  34 16 14*  --   --   --   ALKPHOS 84  --  127* 113 109  --   --   --   BILITOT 0.8  --  0.1*  --  0.8  --   --   --   PROT 7.7  --  7.4  --  8.1  --   --   --   ALBUMIN 3.4*  < > 3.7  --  3.9 3.8 3.5 3.6  < > = values in this interval not displayed.  Recent Labs  07/18/16 0501 07/19/16 0209 07/20/16 1720 07/22/16 0554  WBC 5.7 6.4 8.2 9.6  NEUTROABS 4.2  --  6.4 7.0  HGB 10.4* 11.6* 11.4* 12.0*  HCT 32.9* 37.0* 35.0* 36.4*  MCV 86.6 87.9 86.2 86.1  PLT 171 170 167 201   Lab Results  Component Value Date   TSH 16.62 (A) 06/12/2016   Lab Results  Component Value Date   HGBA1C 5.7 (H) 04/28/2015   Lab Results  Component Value Date   CHOL 101 05/03/2016   HDL 51 05/03/2016   LDLCALC 35 05/03/2016   TRIG 77 05/03/2016   CHOLHDL 4.2 CALC 12/23/2007    Significant Diagnostic Results in last 30 days:  No results found.  Assessment/Plan    Hypertension - BP controlled on lopressor 12.5 mg twice daily   Chronic afib - rate controlled on amoidarone 400 mg daily and  lopressor 12.5 mg twice daily. eliquis changed to coumadin with INR goal 2-2.5 prior to hospital d/c--update INR is pending-INR is now therapeutic  COPD/seasonal allergy -  stable on duoneb every 6 hours as needed and takes claritin 10 mg daily   Hypothyroidism: - Most recent TSH was elevated at 11.77-Synthroid was increased up to 100 g a day-update TSH is pending   Anemia of chronic kidney disease - Hgb 12 at hospital d/c. He gets  aranesp weekly at dialysis   ESRD on HD via right thigh perm-a-cath on TThSa - he had a left thigh AVG placed on 07/22/16. He takes Velphoro  three times daily . he was started on this product week 5 days ago he thinks it is giving him diarrhea and per nursing is refusing it-will have dialysis contact about options and await  their recommendations   Chronic diastolic heart failure - EF 50-55% (12-05-15). Takes  lopressor 12.5 mg twice daily.  fluid volume managed at [facility] has refused dialysis today he will have to be monitored  OSA - he is non compliant with cpap  CAD - stable. No CP c/o. Takes asa 81 mg daily  Bipolar disorder/ depression- mood stable. He takes remeron 15 mg nightly; lamictal 50 mg daiy to stabilize mood. He has  a history of suicidal ideation   Chronically occluded left subclavian vein - started on coumadin prior to d/c. GOAL INR  2-2.5.   DM - diet controlled. Hemoglobin A1c was 5.7 on lab drawn 08/01/2016  Diarrhea-as noted above he feels one of his dialysis medications is causing this and is refusing-will have dialysis contacted about options-he is not complaining of any nausea vomiting or abdominal discomfort but this will have to be watched.  Monitor with vital signs pulse ox every shift for 48 hours-notify provider if diarrhea persists.or increased edema,SOB  [id number]-of note greater than 40 minutes spent assessing patient-discussing his status with nursing staff-reviewing his chart-reviewing his labs-and coordinating and formulating a plan of care for numerous diagnoses-of note greater than 50% of time spent coordinating plan of care

## 2016-08-29 ENCOUNTER — Encounter (HOSPITAL_COMMUNITY): Payer: Self-pay | Admitting: Emergency Medicine

## 2016-08-29 ENCOUNTER — Emergency Department (HOSPITAL_COMMUNITY): Payer: Medicare Other

## 2016-08-29 ENCOUNTER — Inpatient Hospital Stay (HOSPITAL_COMMUNITY)
Admission: EM | Admit: 2016-08-29 | Discharge: 2016-09-04 | DRG: 291 | Disposition: A | Discharge status: Skilled Nursing Facility | Payer: Medicare Other | Attending: Internal Medicine | Admitting: Internal Medicine

## 2016-08-29 DIAGNOSIS — I2510 Atherosclerotic heart disease of native coronary artery without angina pectoris: Secondary | ICD-10-CM | POA: Diagnosis not present

## 2016-08-29 DIAGNOSIS — R791 Abnormal coagulation profile: Secondary | ICD-10-CM | POA: Diagnosis present

## 2016-08-29 DIAGNOSIS — D631 Anemia in chronic kidney disease: Secondary | ICD-10-CM | POA: Diagnosis not present

## 2016-08-29 DIAGNOSIS — N2889 Other specified disorders of kidney and ureter: Secondary | ICD-10-CM | POA: Diagnosis present

## 2016-08-29 DIAGNOSIS — M1990 Unspecified osteoarthritis, unspecified site: Secondary | ICD-10-CM | POA: Diagnosis not present

## 2016-08-29 DIAGNOSIS — T50905A Adverse effect of unspecified drugs, medicaments and biological substances, initial encounter: Secondary | ICD-10-CM | POA: Diagnosis present

## 2016-08-29 DIAGNOSIS — J9621 Acute and chronic respiratory failure with hypoxia: Secondary | ICD-10-CM | POA: Diagnosis not present

## 2016-08-29 DIAGNOSIS — J449 Chronic obstructive pulmonary disease, unspecified: Secondary | ICD-10-CM | POA: Diagnosis not present

## 2016-08-29 DIAGNOSIS — I8290 Acute embolism and thrombosis of unspecified vein: Secondary | ICD-10-CM

## 2016-08-29 DIAGNOSIS — E039 Hypothyroidism, unspecified: Secondary | ICD-10-CM | POA: Diagnosis present

## 2016-08-29 DIAGNOSIS — I953 Hypotension of hemodialysis: Secondary | ICD-10-CM | POA: Diagnosis present

## 2016-08-29 DIAGNOSIS — E875 Hyperkalemia: Secondary | ICD-10-CM | POA: Diagnosis present

## 2016-08-29 DIAGNOSIS — I132 Hypertensive heart and chronic kidney disease with heart failure and with stage 5 chronic kidney disease, or end stage renal disease: Principal | ICD-10-CM | POA: Diagnosis present

## 2016-08-29 DIAGNOSIS — Z79899 Other long term (current) drug therapy: Secondary | ICD-10-CM

## 2016-08-29 DIAGNOSIS — R0603 Acute respiratory distress: Secondary | ICD-10-CM | POA: Diagnosis present

## 2016-08-29 DIAGNOSIS — F319 Bipolar disorder, unspecified: Secondary | ICD-10-CM | POA: Diagnosis present

## 2016-08-29 DIAGNOSIS — G4733 Obstructive sleep apnea (adult) (pediatric): Secondary | ICD-10-CM | POA: Diagnosis not present

## 2016-08-29 DIAGNOSIS — G92 Toxic encephalopathy: Secondary | ICD-10-CM | POA: Diagnosis not present

## 2016-08-29 DIAGNOSIS — Z992 Dependence on renal dialysis: Secondary | ICD-10-CM

## 2016-08-29 DIAGNOSIS — I5033 Acute on chronic diastolic (congestive) heart failure: Secondary | ICD-10-CM | POA: Diagnosis present

## 2016-08-29 DIAGNOSIS — T45515A Adverse effect of anticoagulants, initial encounter: Secondary | ICD-10-CM | POA: Diagnosis present

## 2016-08-29 DIAGNOSIS — J9601 Acute respiratory failure with hypoxia: Secondary | ICD-10-CM | POA: Diagnosis not present

## 2016-08-29 DIAGNOSIS — I4891 Unspecified atrial fibrillation: Secondary | ICD-10-CM | POA: Diagnosis not present

## 2016-08-29 DIAGNOSIS — R197 Diarrhea, unspecified: Secondary | ICD-10-CM | POA: Diagnosis present

## 2016-08-29 DIAGNOSIS — I82B12 Acute embolism and thrombosis of left subclavian vein: Secondary | ICD-10-CM | POA: Diagnosis present

## 2016-08-29 DIAGNOSIS — F1290 Cannabis use, unspecified, uncomplicated: Secondary | ICD-10-CM | POA: Diagnosis not present

## 2016-08-29 DIAGNOSIS — Z9115 Patient's noncompliance with renal dialysis: Secondary | ICD-10-CM

## 2016-08-29 DIAGNOSIS — Z7901 Long term (current) use of anticoagulants: Secondary | ICD-10-CM | POA: Diagnosis not present

## 2016-08-29 DIAGNOSIS — Z8249 Family history of ischemic heart disease and other diseases of the circulatory system: Secondary | ICD-10-CM

## 2016-08-29 DIAGNOSIS — J810 Acute pulmonary edema: Secondary | ICD-10-CM

## 2016-08-29 DIAGNOSIS — Z880 Allergy status to penicillin: Secondary | ICD-10-CM

## 2016-08-29 DIAGNOSIS — Z9119 Patient's noncompliance with other medical treatment and regimen: Secondary | ICD-10-CM | POA: Diagnosis not present

## 2016-08-29 DIAGNOSIS — E1122 Type 2 diabetes mellitus with diabetic chronic kidney disease: Secondary | ICD-10-CM | POA: Diagnosis not present

## 2016-08-29 DIAGNOSIS — E872 Acidosis, unspecified: Secondary | ICD-10-CM

## 2016-08-29 DIAGNOSIS — N186 End stage renal disease: Secondary | ICD-10-CM | POA: Diagnosis not present

## 2016-08-29 DIAGNOSIS — Z6830 Body mass index (BMI) 30.0-30.9, adult: Secondary | ICD-10-CM

## 2016-08-29 DIAGNOSIS — Z7982 Long term (current) use of aspirin: Secondary | ICD-10-CM

## 2016-08-29 DIAGNOSIS — Z789 Other specified health status: Secondary | ICD-10-CM

## 2016-08-29 LAB — BASIC METABOLIC PANEL
ANION GAP: 31 — AB (ref 5–15)
ANION GAP: 29 — AB (ref 5–15)
ANION GAP: 23 — AB (ref 5–15)
BUN: 108 mg/dL — ABNORMAL HIGH (ref 6–20)
BUN: 112 mg/dL — ABNORMAL HIGH (ref 6–20)
BUN: 48 mg/dL — AB (ref 6–20)
CALCIUM: 8.4 mg/dL — AB (ref 8.9–10.3)
CHLORIDE: 94 mmol/L — AB (ref 101–111)
CHLORIDE: 101 mmol/L (ref 101–111)
CO2: 11 mmol/L — AB (ref 22–32)
CO2: 15 mmol/L — ABNORMAL LOW (ref 22–32)
CO2: 16 mmol/L — ABNORMAL LOW (ref 22–32)
CREATININE: 18.89 mg/dL — AB (ref 0.61–1.24)
Calcium: 8.5 mg/dL — ABNORMAL LOW (ref 8.9–10.3)
Calcium: 9.4 mg/dL (ref 8.9–10.3)
Chloride: 96 mmol/L — ABNORMAL LOW (ref 101–111)
Creatinine, Ser: 19.67 mg/dL — ABNORMAL HIGH (ref 0.61–1.24)
Creatinine, Ser: 10.57 mg/dL — ABNORMAL HIGH (ref 0.61–1.24)
GFR, EST AFRICAN AMERICAN: 3 mL/min — AB (ref >60)
GFR, EST AFRICAN AMERICAN: 2 mL/min — AB (ref >60)
GFR, EST AFRICAN AMERICAN: 5 mL/min — AB (ref >60)
GFR, EST NON AFRICAN AMERICAN: 2 mL/min — AB (ref >60)
GFR, EST NON AFRICAN AMERICAN: 2 mL/min — AB (ref >60)
GFR, EST NON AFRICAN AMERICAN: 4 mL/min — AB (ref >60)
GLUCOSE: 92 mg/dL (ref 65–99)
Glucose, Bld: 96 mg/dL (ref 65–99)
Glucose, Bld: 82 mg/dL (ref 65–99)
POTASSIUM: 5.4 mmol/L — AB (ref 3.5–5.1)
POTASSIUM: 5.5 mmol/L — AB (ref 3.5–5.1)
Potassium: 7.2 mmol/L (ref 3.5–5.1)
SODIUM: 138 mmol/L (ref 135–145)
SODIUM: 140 mmol/L (ref 135–145)
Sodium: 138 mmol/L (ref 135–145)

## 2016-08-29 LAB — BLOOD GAS, ARTERIAL
ACID-BASE DEFICIT: 12.5 mmol/L — AB (ref 0.0–2.0)
Bicarbonate: 13.9 mmol/L — ABNORMAL LOW (ref 20.0–28.0)
DELIVERY SYSTEMS: POSITIVE
DRAWN BY: 257881
Expiratory PAP: 5
FIO2: 40
INSPIRATORY PAP: 15
O2 Saturation: 92.8 %
PCO2 ART: 36.8 mmHg (ref 32.0–48.0)
Patient temperature: 98.6
pH, Arterial: 7.203 — ABNORMAL LOW (ref 7.350–7.450)
pO2, Arterial: 91.1 mmHg (ref 83.0–108.0)

## 2016-08-29 LAB — CBC WITH DIFFERENTIAL/PLATELET
BASOS ABS: 0 10*3/uL (ref 0.0–0.1)
BASOS PCT: 0 %
EOS PCT: 0 %
Eosinophils Absolute: 0 10*3/uL (ref 0.0–0.7)
HEMATOCRIT: 35.5 % — AB (ref 39.0–52.0)
Hemoglobin: 11.2 g/dL — ABNORMAL LOW (ref 13.0–17.0)
Lymphocytes Relative: 3 %
Lymphs Abs: 0.4 10*3/uL — ABNORMAL LOW (ref 0.7–4.0)
MCH: 27.7 pg (ref 26.0–34.0)
MCHC: 31.5 g/dL (ref 30.0–36.0)
MCV: 87.9 fL (ref 78.0–100.0)
MONO ABS: 0.9 10*3/uL (ref 0.1–1.0)
MONOS PCT: 8 %
Neutro Abs: 9.8 10*3/uL — ABNORMAL HIGH (ref 1.7–7.7)
Neutrophils Relative %: 89 %
PLATELETS: 242 10*3/uL (ref 150–400)
RBC: 4.04 MIL/uL — ABNORMAL LOW (ref 4.22–5.81)
RDW: 19.2 % — AB (ref 11.5–15.5)
WBC: 11.1 10*3/uL — ABNORMAL HIGH (ref 4.0–10.5)

## 2016-08-29 LAB — GLUCOSE, CAPILLARY
GLUCOSE-CAPILLARY: 84 mg/dL (ref 65–99)
Glucose-Capillary: 83 mg/dL (ref 65–99)

## 2016-08-29 LAB — I-STAT ARTERIAL BLOOD GAS, ED
Acid-base deficit: 9 mmol/L — ABNORMAL HIGH (ref 0.0–2.0)
BICARBONATE: 19 mmol/L — AB (ref 20.0–28.0)
O2 Saturation: 100 %
PCO2 ART: 49.2 mmHg — AB (ref 32.0–48.0)
TCO2: 20 mmol/L (ref 0–100)
pH, Arterial: 7.194 — CL (ref 7.350–7.450)
pO2, Arterial: 257 mmHg — ABNORMAL HIGH (ref 83.0–108.0)

## 2016-08-29 LAB — MRSA PCR SCREENING: MRSA BY PCR: NEGATIVE

## 2016-08-29 LAB — TROPONIN I
TROPONIN I: 0.04 ng/mL — AB (ref <0.03)
TROPONIN I: 0.04 ng/mL — AB (ref <0.03)
TROPONIN I: 0.04 ng/mL — AB (ref <0.03)

## 2016-08-29 LAB — PROTIME-INR
INR: 2.39
Prothrombin Time: 26.5 seconds — ABNORMAL HIGH (ref 11.4–15.2)

## 2016-08-29 LAB — TRIGLYCERIDES: Triglycerides: 99 mg/dL (ref <150)

## 2016-08-29 MED ORDER — FENTANYL CITRATE (PF) 100 MCG/2ML IJ SOLN: 50.0000 ug | INTRAMUSCULAR | Status: DC | PRN

## 2016-08-29 MED ORDER — ALBUTEROL SULFATE (2.5 MG/3ML) 0.083% IN NEBU: 2.5000 mg | INHALATION_SOLUTION | RESPIRATORY_TRACT | Status: DC | PRN

## 2016-08-29 MED ORDER — ALTEPLASE 2 MG IJ SOLR
2.0000 mg | Freq: Once | INTRAMUSCULAR | Status: DC | PRN
  Filled 2016-08-29: qty 2

## 2016-08-29 MED ORDER — HEPARIN SODIUM (PORCINE) 1000 UNIT/ML DIALYSIS
1000.0000 [IU] | INTRAMUSCULAR | Status: DC | PRN
  Filled 2016-08-29: qty 1

## 2016-08-29 MED ORDER — DEXTROSE 50 % IV SOLN
50.0000 mL | Freq: Once | INTRAVENOUS | Status: AC
Start: 2016-08-29 — End: 2016-08-29
  Administered 2016-08-29: 50 mL via INTRAVENOUS
  Filled 2016-08-29: qty 50

## 2016-08-29 MED ORDER — MIDAZOLAM HCL 2 MG/2ML IJ SOLN
INTRAMUSCULAR | Status: AC
  Filled 2016-08-29: qty 6

## 2016-08-29 MED ORDER — ASPIRIN 81 MG PO CHEW
324.0000 mg | CHEWABLE_TABLET | ORAL | Status: DC
  Filled 2016-08-29: qty 4

## 2016-08-29 MED ORDER — DEXTROSE 50 % IV SOLN
1.0000 | Freq: Once | INTRAVENOUS | Status: AC
  Administered 2016-08-29: 50 mL via INTRAVENOUS

## 2016-08-29 MED ORDER — ALBUTEROL (5 MG/ML) CONTINUOUS INHALATION SOLN
INHALATION_SOLUTION | RESPIRATORY_TRACT | Status: AC
  Administered 2016-08-29: 10 mg/h via RESPIRATORY_TRACT
  Filled 2016-08-29: qty 20

## 2016-08-29 MED ORDER — SODIUM CHLORIDE 0.9 % IV SOLN
1.0000 g | Freq: Once | INTRAVENOUS | Status: AC
  Administered 2016-08-29: 1 g via INTRAVENOUS
  Filled 2016-08-29: qty 10

## 2016-08-29 MED ORDER — INSULIN ASPART 100 UNIT/ML ~~LOC~~ SOLN
SUBCUTANEOUS | Status: AC
  Filled 2016-08-29: qty 1

## 2016-08-29 MED ORDER — CHLORHEXIDINE GLUCONATE 0.12% ORAL RINSE (MEDLINE KIT)
15.0000 mL | Freq: Two times a day (BID) | OROMUCOSAL | Status: DC
  Administered 2016-08-29 – 2016-08-31 (×5): 15 mL via OROMUCOSAL

## 2016-08-29 MED ORDER — FENTANYL CITRATE (PF) 100 MCG/2ML IJ SOLN
100.0000 ug | Freq: Once | INTRAMUSCULAR | Status: AC
  Administered 2016-08-29: 100 ug via INTRAVENOUS

## 2016-08-29 MED ORDER — LAMOTRIGINE 25 MG PO TABS
25.0000 mg | ORAL_TABLET | Freq: Two times a day (BID) | ORAL | Status: DC
  Administered 2016-08-29 – 2016-09-04 (×13): 25 mg via ORAL
  Filled 2016-08-29 (×13): qty 1

## 2016-08-29 MED ORDER — AMIODARONE HCL 200 MG PO TABS
400.0000 mg | ORAL_TABLET | Freq: Every day | ORAL | Status: DC
  Administered 2016-08-30 – 2016-09-04 (×6): 400 mg via ORAL
  Filled 2016-08-29 (×6): qty 2

## 2016-08-29 MED ORDER — DARBEPOETIN ALFA 40 MCG/0.4ML IJ SOSY
40.0000 ug | PREFILLED_SYRINGE | INTRAMUSCULAR | Status: DC
  Administered 2016-08-29 – 2016-09-04 (×2): 40 ug via INTRAVENOUS
  Filled 2016-08-29 (×3): qty 0.4

## 2016-08-29 MED ORDER — ALBUMIN HUMAN 25 % IV SOLN
25.0000 g | Freq: Once | INTRAVENOUS | Status: AC
  Administered 2016-08-29: 25 g via INTRAVENOUS
  Filled 2016-08-29: qty 100

## 2016-08-29 MED ORDER — INSULIN ASPART 100 UNIT/ML IV SOLN: 10.0000 [IU] | Freq: Once | INTRAVENOUS | Status: DC

## 2016-08-29 MED ORDER — SUCCINYLCHOLINE CHLORIDE 20 MG/ML IJ SOLN
INTRAMUSCULAR | Status: DC | PRN
  Administered 2016-08-29: 100 mg via INTRAVENOUS

## 2016-08-29 MED ORDER — DOXERCALCIFEROL 4 MCG/2ML IV SOLN
2.0000 ug | INTRAVENOUS | Status: DC
  Administered 2016-08-29 – 2016-09-04 (×3): 2 ug via INTRAVENOUS
  Filled 2016-08-29 (×6): qty 2

## 2016-08-29 MED ORDER — HEPARIN SODIUM (PORCINE) 5000 UNIT/ML IJ SOLN
5000.0000 [IU] | Freq: Three times a day (TID) | INTRAMUSCULAR | Status: DC
  Administered 2016-08-29 – 2016-08-30 (×3): 5000 [IU] via SUBCUTANEOUS
  Filled 2016-08-29 (×4): qty 1

## 2016-08-29 MED ORDER — SODIUM CHLORIDE 0.9 % IV SOLN: 100.0000 mL | INTRAVENOUS | Status: DC | PRN

## 2016-08-29 MED ORDER — FENTANYL CITRATE (PF) 100 MCG/2ML IJ SOLN
INTRAMUSCULAR | Status: AC
  Filled 2016-08-29: qty 2

## 2016-08-29 MED ORDER — LIDOCAINE HCL (PF) 1 % IJ SOLN
5.0000 mL | INTRAMUSCULAR | Status: DC | PRN
  Filled 2016-08-29: qty 5

## 2016-08-29 MED ORDER — ALBUMIN HUMAN 25 % IV SOLN
25.0000 g | Freq: Once | INTRAVENOUS | Status: AC
  Administered 2016-08-29: 25 g via INTRAVENOUS
  Filled 2016-08-29: qty 50

## 2016-08-29 MED ORDER — SODIUM BICARBONATE 8.4 % IV SOLN
INTRAVENOUS | Status: AC
  Filled 2016-08-29: qty 50

## 2016-08-29 MED ORDER — ASPIRIN 81 MG PO CHEW
81.0000 mg | CHEWABLE_TABLET | Freq: Every day | ORAL | Status: DC
  Administered 2016-08-30 – 2016-09-04 (×6): 81 mg via ORAL
  Filled 2016-08-29 (×5): qty 1

## 2016-08-29 MED ORDER — MIRTAZAPINE 15 MG PO TBDP
15.0000 mg | ORAL_TABLET | Freq: Every day | ORAL | Status: DC
  Administered 2016-08-29 – 2016-09-03 (×5): 15 mg via ORAL
  Filled 2016-08-29 (×7): qty 1

## 2016-08-29 MED ORDER — CHLORHEXIDINE GLUCONATE 0.12 % MT SOLN
15.0000 mL | Freq: Two times a day (BID) | OROMUCOSAL | Status: DC
  Administered 2016-08-29: 15 mL via OROMUCOSAL

## 2016-08-29 MED ORDER — ETOMIDATE 2 MG/ML IV SOLN
INTRAVENOUS | Status: DC | PRN
  Administered 2016-08-29: 20 mg via INTRAVENOUS

## 2016-08-29 MED ORDER — MIDAZOLAM HCL 2 MG/2ML IJ SOLN
5.0000 mg | Freq: Once | INTRAMUSCULAR | Status: AC
  Administered 2016-08-29: 5 mg via INTRAVENOUS

## 2016-08-29 MED ORDER — WARFARIN - PHARMACIST DOSING INPATIENT: Freq: Every day | Status: DC

## 2016-08-29 MED ORDER — ALBUMIN HUMAN 5 % IV SOLN
INTRAVENOUS | Status: AC
  Filled 2016-08-29: qty 250

## 2016-08-29 MED ORDER — PENTAFLUOROPROP-TETRAFLUOROETH EX AERO: 1.0000 "application " | INHALATION_SPRAY | CUTANEOUS | Status: DC | PRN

## 2016-08-29 MED ORDER — DEXTROSE 50 % IV SOLN
INTRAVENOUS | Status: AC
  Filled 2016-08-29: qty 50

## 2016-08-29 MED ORDER — ORAL CARE MOUTH RINSE
15.0000 mL | Freq: Four times a day (QID) | OROMUCOSAL | Status: DC
  Administered 2016-08-29 – 2016-08-31 (×8): 15 mL via OROMUCOSAL

## 2016-08-29 MED ORDER — WARFARIN SODIUM 7.5 MG PO TABS
7.5000 mg | ORAL_TABLET | Freq: Once | ORAL | Status: AC
  Administered 2016-08-29: 7.5 mg
  Filled 2016-08-29: qty 1

## 2016-08-29 MED ORDER — IPRATROPIUM-ALBUTEROL 0.5-2.5 (3) MG/3ML IN SOLN
3.0000 mL | Freq: Four times a day (QID) | RESPIRATORY_TRACT | Status: DC
  Administered 2016-08-29 – 2016-09-01 (×13): 3 mL via RESPIRATORY_TRACT
  Filled 2016-08-29 (×13): qty 3

## 2016-08-29 MED ORDER — ASPIRIN 300 MG RE SUPP: 300.0000 mg | RECTAL | Status: DC

## 2016-08-29 MED ORDER — LIDOCAINE-PRILOCAINE 2.5-2.5 % EX CREA
1.0000 "application " | TOPICAL_CREAM | CUTANEOUS | Status: DC | PRN
  Filled 2016-08-29: qty 5

## 2016-08-29 MED ORDER — SODIUM POLYSTYRENE SULFONATE 15 GM/60ML PO SUSP
60.0000 g | Freq: Once | ORAL | Status: AC
  Administered 2016-08-29: 60 g via ORAL
  Filled 2016-08-29: qty 240

## 2016-08-29 MED ORDER — SODIUM BICARBONATE 8.4 % IV SOLN
50.0000 meq | Freq: Once | INTRAVENOUS | Status: AC
  Administered 2016-08-29: 50 meq via INTRAVENOUS
  Filled 2016-08-29 (×2): qty 50

## 2016-08-29 MED ORDER — FENTANYL CITRATE (PF) 100 MCG/2ML IJ SOLN
50.0000 ug | INTRAMUSCULAR | Status: DC | PRN
  Administered 2016-08-29: 50 ug via INTRAVENOUS
  Filled 2016-08-29: qty 2

## 2016-08-29 MED ORDER — SODIUM BICARBONATE 8.4 % IV SOLN
50.0000 meq | Freq: Once | INTRAVENOUS | Status: AC
  Administered 2016-08-29: 50 meq via INTRAVENOUS

## 2016-08-29 MED ORDER — PROPOFOL 1000 MG/100ML IV EMUL
5.0000 ug/kg/min | Freq: Once | INTRAVENOUS | Status: AC
  Administered 2016-08-29: 5 ug/kg/min via INTRAVENOUS
  Filled 2016-08-29: qty 100

## 2016-08-29 MED ORDER — SODIUM CHLORIDE 0.9 % IV SOLN: 250.0000 mL | INTRAVENOUS | Status: DC | PRN

## 2016-08-29 MED ORDER — PANTOPRAZOLE SODIUM 40 MG IV SOLR
40.0000 mg | INTRAVENOUS | Status: DC
  Administered 2016-08-29 – 2016-08-31 (×3): 40 mg via INTRAVENOUS
  Filled 2016-08-29 (×3): qty 40

## 2016-08-29 MED ORDER — ORAL CARE MOUTH RINSE
15.0000 mL | Freq: Two times a day (BID) | OROMUCOSAL | Status: DC
  Administered 2016-08-29 (×2): 15 mL via OROMUCOSAL

## 2016-08-29 MED ORDER — ALBUTEROL (5 MG/ML) CONTINUOUS INHALATION SOLN
10.0000 mg/h | INHALATION_SOLUTION | RESPIRATORY_TRACT | Status: DC
  Administered 2016-08-29: 10 mg/h via RESPIRATORY_TRACT

## 2016-08-29 MED ORDER — INSULIN ASPART 100 UNIT/ML ~~LOC~~ SOLN
10.0000 [IU] | Freq: Once | SUBCUTANEOUS | Status: AC
  Administered 2016-08-29: 10 [IU] via INTRAVENOUS
  Filled 2016-08-29: qty 1

## 2016-08-29 MED ORDER — LEVOTHYROXINE SODIUM 100 MCG IV SOLR
50.0000 ug | Freq: Every day | INTRAVENOUS | Status: DC
Start: 2016-08-29 — End: 2016-09-01
  Administered 2016-08-29 – 2016-09-01 (×4): 50 ug via INTRAVENOUS
  Filled 2016-08-29 (×5): qty 5

## 2016-08-29 MED ORDER — INSULIN ASPART 100 UNIT/ML ~~LOC~~ SOLN
10.0000 [IU] | Freq: Once | SUBCUTANEOUS | Status: AC
  Administered 2016-08-29: 10 [IU] via INTRAVENOUS

## 2016-08-29 MED ORDER — PROPOFOL 1000 MG/100ML IV EMUL
5.0000 ug/kg/min | INTRAVENOUS | Status: DC
  Administered 2016-08-29 (×2): 25 ug/kg/min via INTRAVENOUS
  Administered 2016-08-29: 15 ug/kg/min via INTRAVENOUS
  Administered 2016-08-30: 25 ug/kg/min via INTRAVENOUS
  Filled 2016-08-29 (×3): qty 100

## 2016-08-29 NOTE — Progress Notes (Signed)
Patient transported from ED to room 123456 without complications.

## 2016-08-29 NOTE — H&P (Signed)
PULMONARY / CRITICAL CARE MEDICINE   Name: Noah Lewis MRN: [id number] DOB: 04-04-1951    ADMISSION DATE:  08/29/2016 CONSULTATION DATE:  08/29/2016  REFERRING MD:  Dr. Roxanne Mins   CHIEF COMPLAINT:  Dyspnea, Hyperkalemia   HISTORY OF PRESENT ILLNESS:  Information obtain from medical record and medical providers as patient is intubated and sedated.   65 year old male with PMH of COPD, HTN, Type 2 DM, ESRD on HD T/R/S presents to the ED from [facility] on 12/7 with dyspnea, on arrival patient was only able to speak 1-2 words at a time without pausing. Patient reported he has missed Tuesday dialysis (per medical record patient refused session because he was experiencing diarrhea) sessions, most recent session was on Saturday 12/2. Potassium 7.2 with wide complex EKG changes. Nephrology consulted. While in ED patient went into Respiratory distress requiring intubation. PCCM called to admit.   Patient had recent hospitalization from 10/24-10/31 for pulmonary edema related to missed dialysis session. During this stay was placed on coumadin, and had a left femoral AV loop graft placed on 10/30. Was discharged to assisted living facility.   PAST MEDICAL HISTORY :  He  has a past medical history of Arthritis; Asthma; Cancer ([place]); COPD (chronic obstructive pulmonary disease) ([place]); Coronary artery disease; Diabetes mellitus without complication ([place]); Hypertension; and Renal disorder.  PAST SURGICAL HISTORY: He  has a past surgical history that includes No past surgeries; Insertion of dialysis catheter; Insertion of dialysis catheter (N/A, 05/29/2015); Cardiac catheterization (N/A, 10/25/2015); Cardiac catheterization (Left, 02/22/2016); Cardiac catheterization (Right, 03/19/2016); Cardiac catheterization (N/A, 04/22/2016); ir generic historical (07/16/2016); ir generic historical (07/16/2016); ir generic historical (07/16/2016); ir generic historical (07/16/2016); Ligation of arteriovenous   fistula (Left, 07/17/2016); and AV fistula placement (Left, 07/22/2016).  Allergies  Allergen Reactions  . Penicillins Other (See Comments)    Reaction:  Unknown     No current facility-administered medications on file prior to encounter.    Current Outpatient Prescriptions on File Prior to Encounter  Medication Sig  . acetaminophen (TYLENOL) 325 MG tablet Take 650 mg by mouth every 6 (six) hours as needed for mild pain, fever or headache.  Marland Kitchen amiodarone (PACERONE) 400 MG tablet Take 1 tablet (400 mg total) by mouth daily.  Marland Kitchen aspirin 81 MG chewable tablet Chew 1 tablet (81 mg total) by mouth daily.  Marland Kitchen b complex-vitamin c-folic acid (NEPHRO-VITE) 0.8 MG TABS tablet Take 1 tablet by mouth 2 (two) times daily.  . calcitRIOL (ROCALTROL) 0.5 MCG capsule Take 1 capsule (0.5 mcg total) by mouth every other day.  . Capsaicin (CAPSAGEL) 0.025 % GEL Apply 1 application topically 2 (two) times daily as needed (for lower back pain).  Marland Kitchen lamoTRIgine (LAMICTAL) 25 MG tablet Take 2 tablets (50 mg total) by mouth daily.  Marland Kitchen levothyroxine (SYNTHROID, LEVOTHROID) 100 MCG tablet Take 100 mcg by mouth daily before breakfast.  . loratadine (CLARITIN) 10 MG tablet Take 1 tablet (10 mg total) by mouth daily.  . metoprolol tartrate (LOPRESSOR) 25 MG tablet Take 0.5 tablets (12.5 mg total) by mouth 2 (two) times daily.  . mirtazapine (REMERON SOL-TAB) 15 MG disintegrating tablet Take 1 tablet (15 mg total) by mouth at bedtime.  . polyethylene glycol (MIRALAX / GLYCOLAX) packet Take 17 g by mouth daily as needed for mild constipation.   . saccharomyces boulardii (FLORASTOR) 250 MG capsule Take 250 mg by mouth 2 (two) times daily.  . sucroferric oxyhydroxide (VELPHORO) 500 MG chewable tablet Chew 500-1,000 mg by mouth See  admin instructions. Give 1000mg  with meals, May also give 500 mg by mouth as needed with snacks.  . Travoprost, BAK Free, (TRAVATAN) 0.004 % SOLN ophthalmic solution Place 1 drop into both eyes at  bedtime.  Marland Kitchen warfarin (COUMADIN) 7.5 MG tablet Take 7.5 mg by mouth daily.  Marland Kitchen ipratropium-albuterol (DUONEB) 0.5-2.5 (3) MG/3ML SOLN Take 3 mLs by nebulization every 6 (six) hours as needed (for wheezing/shortness of breath).    FAMILY HISTORY:  His indicated that his mother is alive. He indicated that his father is deceased. He indicated that the status of his other is unknown.    SOCIAL HISTORY: He  reports that he has never smoked. He has never used smokeless tobacco. He reports that he uses drugs, including Marijuana. He reports that he does not drink alcohol.  REVIEW OF SYSTEMS:   Unable to obtain, patient intubated.   SUBJECTIVE:    VITAL SIGNS: BP 106/76   Pulse 111   Resp 24   Ht 5\' 6"  (1.676 m)   SpO2 100%   HEMODYNAMICS:    VENTILATOR SETTINGS: Vent Mode: PRVC FiO2 (%):  [60 %-100 %] 60 % Set Rate:  [18 bmp-26 bmp] 26 bmp Vt Set:  [510 mL] 510 mL PEEP:  [5 cmH20] 5 cmH20 Plateau Pressure:  [13 cmH20-20 cmH20] 13 cmH20  INTAKE / OUTPUT: No intake/output data recorded.  PHYSICAL EXAMINATION: General: Adult male, sedated on vent  Neuro: Sedated, moves all extremities to physical stimulation  HEENT: normocephalic , ET in place  Cardiovascular: s1/s2, irregular rhythm, no MRG  Lungs: Diffuse crackles throughout, expiratory wheezes, unlabored, on vent  Abdomen: non-distended, active bowel sounds  Musculoskeletal: no deformities  Skin: intact, dry, HD cath to right femoral   LABS:  BMET  Recent Labs Lab 08/29/16 0618  NA 138  K 7.2*  CL 96*  CO2 11*  BUN 108*  CREATININE 18.89*  GLUCOSE 92    Electrolytes  Recent Labs Lab 08/29/16 0618  CALCIUM 8.4*    CBC  Recent Labs Lab 08/29/16 0618  WBC 11.1*  HGB 11.2*  HCT 35.5*  PLT 242    Coag's No results for input(s): APTT, INR in the last 168 hours.  Sepsis Markers No results for input(s): LATICACIDVEN, PROCALCITON, O2SATVEN in the last 168 hours.  ABG  Recent Labs Lab  08/29/16 0704  PHART 7.194*  PCO2ART 49.2*  PO2ART 257.0*    Liver Enzymes No results for input(s): AST, ALT, ALKPHOS, BILITOT, ALBUMIN in the last 168 hours.  Cardiac Enzymes No results for input(s): TROPONINI, PROBNP in the last 168 hours.  Glucose No results for input(s): GLUCAP in the last 168 hours.  Imaging Dg Chest Port 1 View  Result Date: 08/29/2016 CLINICAL DATA:  Respiratory failure EXAM: PORTABLE CHEST 1 VIEW COMPARISON:  08/29/2016 at 06:14 FINDINGS: Endotracheal tube is 5 cm above the carina. Nasogastric tube extends into the stomach and beyond the inferior edge of the image. Central airspace opacities persist bilaterally without significant interval change. IMPRESSION: Support equipment appears satisfactorily positioned. No significant interval change in the bilateral airspace opacities. Electronically Signed   By: Andreas Newport M.D.   On: 08/29/2016 07:01   Dg Chest Port 1 View  Result Date: 08/29/2016 CLINICAL DATA:  Dyspnea. EXAM: PORTABLE CHEST 1 VIEW COMPARISON:  07/17/2016 FINDINGS: Vascular and interstitial congestive changes. Central airspace opacity. Probable small effusions. Marked cardiomegaly. IMPRESSION: The findings likely represent congestive heart failure. Electronically Signed   By: Andreas Newport M.D.   On: 08/29/2016  06:27     STUDIES:  12/7 CXR > Vascular and interstitial congestive changes. Small effusions. Marked Cardiomegaly.   CULTURES:   ANTIBIOTICS:   SIGNIFICANT EVENTS: 10/24-10/31 > Hospital Admission for SOB, pulm edema in sitting of missed dialysis session  10/30 > Left femoral AV loop Graft   LINES/TUBES: Left femoral AV loop Graft 10/30 Right Femoral permcath 10/24 >  ETT 12/7 >   DISCUSSION: 65 year old male presents to ED 12/7 in acute on chronic respiratory failure secondary to pulmonary edema. States the he missed dialysis on Tuesday, currently potassium 7.2 with wide complex EKG changes. In ED required intubated.  Will be transferred to ICU and receive dialysis. Will re-assess vent needs post-dialysis.   ASSESSMENT / PLAN:  PULMONARY A: Acute on Chronic Respiratory Failure  H/O COPD, OSA (non-compliant with CPAP)  P:   Pressure Support 40/5  8cc kg volume  Wean as tolerated  ABG at 10:00 Duonebs q6H PRN albuterol   CARDIOVASCULAR A:  Acute on Chronic Diastolic Heart Failure  Chronically Occluded left Subclavian vein  H/O HTN, Afib (on home Amiodarone/Coumadin), CAD  P:  Cardiac Monitoring  Check INR - if okay will resume anticoagulation  Restart home amiodarone  Restart Aspirin  Hold home BP meds  Trend Trop  RENAL A:   Acute on Chronic ESRD  Hyperkalemia - s/p Kayexalate, Temporization in ED  HD dependent T/R/S H/O Right Kidney Mass (Being follow outpatient) P:   Nephrology on board  Dialysis today  Trend BMP  GASTROINTESTINAL A:   NPO P:   PPI   HEMATOLOGIC A:   Anemia of CKD  P:  Trend CBC   INFECTIOUS A:   No issues - does have HD cath to right femoral (does not look infected)  P:   Trend Fever and WBC curve    ENDOCRINE A:    Type 2 DM  Hypothyroidism  P:   Continue home synthroid  SSI Q4H Glucose Checks   NEUROLOGIC A:   Drug Induced Encephalopathy  H/O Bipolar disorder, depression P:   Wean propofol as tolerated  Continue home remeron and lamicatal  PRN fentanyl as needed to achieve RASS  RASS goal: 0/-1    FAMILY  - Updates: no family at bedside 12/7   - Inter-disciplinary family meet or Palliative Care meeting due by:  09/05/2016     Hayden Pedro, AG-ACNP [facility]  Pgr: [phone]  PCCM Pgr: [phone]  Attending Note:  I have examined patient, reviewed labs, studies and notes. I have discussed the case with Beaulah Corin, and I agree with the data and plans as amended above. Pt is 58 with ESRD (TThS), multiple comorbid problems including DM, HTN. He missed his HD session 12/5, declined due to  diarrhea. He presented to the ED 12/7 early am with hyperkalemia, B infiltrates on CXR, evolving resp failure, wide complex ECG changes. He was intubated and treated medically for hyperK+, referred to Renal for emergent HD. On my eval he is sedated, does move with stim but not really purposeful. Does not wake or follow commands. ? Some RUE tremor with stim. Coarse breath sounds bilaterally. ETT in good position. 1+ LE edema present. We will keep him sedated with plans for urgent HD now. Once he has received, we will assess for possible SBT. Hopefully he will extubate quickly once volume and metabolic issues resolved. Restart his home amiodarone once HD initiated. Follow CXR.  Independent critical care time is 45 minutes.  Baltazar Apo, MD, PhD 08/29/2016, 10:46 AM [facility] [phone] or if no answer [phone]

## 2016-08-29 NOTE — Procedures (Signed)
Patient was seen on dialysis and the procedure was supervised.  BFR 350  Via PC BP is  95/62.   Patient appears to be tolerating treatment well- Pre HD K was 5.4- will change to 2 K   Noah Lewis A 08/29/2016

## 2016-08-29 NOTE — ED Notes (Signed)
Attempted report 

## 2016-08-29 NOTE — Progress Notes (Addendum)
CRITICAL VALUE ALERT  Critical value received: Troponin 0.04  Date of notification:  08/29/16  Time of notification:  [id number]  Critical value read back:Yes.    Nurse who received alert:  Mickle Mallory   MD notified (1st page):  Dr. Ashok Cordia   Time of first page:  1035  Responding MD : [phone]

## 2016-08-29 NOTE — Progress Notes (Addendum)
[facility] Brief Note  (Full note to follow)  Rec'd call 6:04 AM that Mr. Newitt has presented to ED with SOB, ECG changes NO labs back yet but ECG with "near sine wave" - presumed severe hyperkalemia They are attempting IV access for glucose/insulin/calcium  Per Dr. Roxanne Mins is maintaining sats and BP at present. Last HD was Saturday (missed Tuesday) HD Rx [place]  4 hours 2K2Ca EDW 83 TDC 400/A1.5 Heparin 5000 Hectorol 2 TIW Aranesp 40/week  Orders in, dialysis unit notified  Will get pt up to unit ASAP 0K bath for 30 minutes then 1Kbath pending stat labs  Jamal Maes, MD [place] Pager 08/29/2016, 6:30 AM  Addendum:  Change in pt status: Pt is currently requiring intubation - will therefore need more expansive conservative Rx for hyperkalemia until ICU bed is available. HD unit notified.  Jamal Maes, MD [facility] [phone] Pager 08/29/2016, 6:39 AM

## 2016-08-29 NOTE — ED Triage Notes (Signed)
Pt presents to ER with [facility] for [facility] and ECG changes; unknown who called 911; pt lethargic, cool to touch, no PIV access at this time

## 2016-08-29 NOTE — ED Notes (Signed)
Respiratory at bedside.

## 2016-08-29 NOTE — Progress Notes (Signed)
Pt is FS at this time tolerating it well, RN at bedside. No distress or complications noted. RR set 14 per MD orders.

## 2016-08-29 NOTE — ED Provider Notes (Signed)
[facility] DEPT Provider Note   CSN: [id number] Arrival date & time: 08/29/16  0606     History   Chief Complaint No chief complaint on file.   HPI Noah Lewis is a 65 y.o. male.  He has end-stage renal disease on hemodialysis every Tuesday-Thursday-Saturday. He missed his dialysis session 2 days ago, most recent dialysis was 5 days ago. This morning, he developed dyspnea. Denies chest pain, heaviness, tightness, pressure. EMS arrived and noted he was hypoxic and put him on supplemental oxygen. He was noted to have wheezing and was given albuterol via nebulizer.   The history is provided by the patient and the EMS personnel. The history is limited by the condition of the patient (Severely dyspneic).    Past Medical History:  Diagnosis Date  . Arthritis   . Asthma   . Cancer ([place])   . COPD (chronic obstructive pulmonary disease) ([place])   . Coronary artery disease   . Diabetes mellitus without complication ([place])   . Hypertension   . Renal disorder     Patient Active Problem List   Diagnosis Date Noted  . Chronic diastolic heart failure ([place]) 07/24/2016  . Acute kidney injury superimposed on chronic kidney disease ([place]) 07/16/2016  . ESRD (end stage renal disease) ([place]) 06/13/2016  . 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 Pulmonary edema 05/27/2016  . Back pain   . Bacteremia   . Chronic a-fib ([place]) 12/04/2015  . Hypertensive urgency 09/20/2015  . 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 Anemia of chronic kidney failure   . Left arm swelling   . End-stage renal disease needing dialysis ([place]) 05/29/2015  . 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 COPD, moderate ([place]) 05/24/2015  . OSA (obstructive sleep apnea) 05/24/2015  . 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 ESRD on hemodialysis ([place]) 05/13/2015  . Volume overload 04/28/2015  . Bipolar  depression ([place]) 04/28/2015  . Benign hypertension with end-stage renal disease ([place]) 04/28/2015  . Chronic back pain 04/28/2015  . Facial swelling 04/28/2015  . Left upper extremity swelling     Past Surgical History:  Procedure Laterality Date  . AV FISTULA PLACEMENT Left 07/22/2016   Procedure: INSERTION OF ARTERIOVENOUS (AV) LOOP GRAFT LEFT THIGH;  Surgeon: Waynetta Sandy, MD;  Location: [facility];  Service: Vascular;  Laterality: Left;  . INSERTION OF DIALYSIS CATHETER    . INSERTION OF DIALYSIS CATHETER N/A 05/29/2015   Procedure: INSERTION OF DIALYSIS CATHETER;  Surgeon: Angelia Mould, MD;  Location: [facility];  Service: Vascular;  Laterality: N/A;  . IR GENERIC HISTORICAL  07/16/2016   IR US GUIDE VASC ACCESS RIGHT 07/16/2016 Arne Cleveland, MD [facility]  . IR GENERIC HISTORICAL  07/16/2016   IR FLUORO GUIDE CV LINE RIGHT 07/16/2016 Arne Cleveland, MD [facility]  . IR GENERIC HISTORICAL  07/16/2016   IR US GUIDE VASC ACCESS RIGHT 07/16/2016 Arne Cleveland, MD [facility]  . IR GENERIC HISTORICAL  07/16/2016   IR VENO/EXT/UNI RIGHT 07/16/2016 Arne Cleveland, MD [facility]  . LIGATION OF ARTERIOVENOUS  FISTULA Left 07/17/2016   Procedure: LIGATION OF BRACHIOCEPHALIC ARTERIOVENOUS  FISTULA  Left arm;  Surgeon: Rosetta Posner, MD;  Location: [facility];  Service: Vascular;  Laterality: Left;  . NO PAST SURGERIES    . PERIPHERAL VASCULAR CATHETERIZATION N/A 10/25/2015   Procedure: Dialysis/Perma Catheter Insertion;  Surgeon: Katha Cabal, MD;  Location: [facility];  Service: Cardiovascular;  Laterality: N/A;  . PERIPHERAL VASCULAR CATHETERIZATION Left 02/22/2016   Procedure: A/V Shuntogram/Fistulagram;  Surgeon: Algernon Huxley, MD;  Location: [facility];  Service: Cardiovascular;  Laterality: Left;  . PERIPHERAL VASCULAR CATHETERIZATION Right 03/19/2016   Procedure: Dialysis/Perma Catheter Insertion;  Surgeon: Katha Cabal, MD;  Location: [facility];  Service: Cardiovascular;  Laterality: Right;  . PERIPHERAL VASCULAR CATHETERIZATION N/A 04/22/2016   Procedure: Dialysis/Perma Catheter Removal;  Surgeon: Algernon Huxley, MD;  Location: [facility];  Service: Cardiovascular;  Laterality: N/A;       Home Medications    Prior to Admission medications   Medication Sig Start Date End Date Taking? Authorizing Provider  acetaminophen (TYLENOL) 325 MG tablet Take 650 mg by mouth every 6 (six) hours as needed for mild pain, fever or headache.    Historical Provider, MD  amiodarone (PACERONE) 400 MG tablet Take 1 tablet (400 mg total) by mouth daily. 12/14/15   Hillary Bow, MD  aspirin 81 MG chewable tablet Chew 1 tablet (81 mg total) by mouth daily. 06/02/15   Maryellen Pile, MD  b complex-vitamin c-folic acid (NEPHRO-VITE) 0.8 MG TABS tablet Take 1 tablet by mouth 2 (two) times daily.    Historical Provider, MD  calcitRIOL (ROCALTROL) 0.5 MCG capsule Take 1 capsule (0.5 mcg total) by mouth every other day. 07/23/16   Nita Sells, MD  Capsaicin (CAPSAGEL) 0.025 % GEL Apply 1 application topically 2 (two) times daily as needed (for lower back pain).    Historical Provider, MD  ipratropium-albuterol (DUONEB) 0.5-2.5 (3) MG/3ML SOLN Take 3 mLs by nebulization every 6 (six) hours as needed (for wheezing/shortness of breath).    Historical Provider, MD  lamoTRIgine (LAMICTAL) 25 MG tablet Take 2 tablets (50 mg total) by mouth daily. 07/23/16   Nita Sells, MD  levothyroxine (SYNTHROID, LEVOTHROID) 100 MCG tablet Take 100 mcg by mouth daily before breakfast.    Historical Provider, MD  loratadine (CLARITIN) 10 MG tablet Take 1 tablet (10 mg total) by mouth daily. 05/24/15   Gerlene Fee, NP  metoprolol tartrate (LOPRESSOR) 25 MG tablet Take 0.5 tablets (12.5 mg total) by mouth 2 (two) times daily. 07/23/16   Nita Sells, MD  mirtazapine (REMERON SOL-TAB) 15 MG disintegrating tablet Take 1 tablet (15 mg total)  by mouth at bedtime. 07/23/16   Nita Sells, MD  polyethylene glycol (MIRALAX / GLYCOLAX) packet Take 17 g by mouth daily as needed for mild constipation.     Historical Provider, MD  saccharomyces boulardii (FLORASTOR) 250 MG capsule Take 250 mg by mouth 2 (two) times daily.    Historical Provider, MD  sucroferric oxyhydroxide (VELPHORO) 500 MG chewable tablet Chew 1,000 mg by mouth 3 (three) times daily with meals. May also give 500 mg by mouth as needed    Historical Provider, MD  Travoprost, BAK Free, (TRAVATAN) 0.004 % SOLN ophthalmic solution Place 1 drop into both eyes at bedtime.    Historical Provider, MD  warfarin (COUMADIN) 7.5 MG tablet Take 7.5 mg by mouth daily. 08/26/16 09/02/16  Historical Provider, MD    Family History Family History  Problem Relation Age of Onset  . Hypertension Other   . Diabetes Other     Social History Social History  Substance Use Topics  . Smoking status: Never Smoker  . Smokeless tobacco: Never Used  . Alcohol use No     Allergies   Penicillins  Review of Systems Review of Systems  Unable to perform ROS: Acuity of condition     Physical Exam Updated Vital Signs BP (!) 168/128   Pulse 113   Resp 22   Ht 5\' 6"  (1.676 m)   SpO2 90%   Physical Exam  Nursing note and vitals reviewed.  65 year old male in moderate respiratory distress. He is only able to speak 1 or 2 words at a time. Vital signs are Significant for tachycardia, tachypnea, hypertension. Oxygen saturation is 98%, which is normal, but only obtained with being on a nonrebreather oxygen mask. Head is normocephalic and atraumatic. Face is plethoric. PERRLA, EOMI. Oropharynx is clear. Neck is nontender and supple without adenopathy or JVD. Back is nontender and there is no CVA tenderness. Lungs have decreased breath sounds with coarse expiratory wheezing consistent with cardiac asthma. There are no rales or rhonchi Chest is nontender. Heart has regular rate and  rhythm without murmur. Abdomen is soft, flat, nontender without masses or hepatosplenomegaly and peristalsis is normoactive. Extremities have no cyanosis or edema, full range of motion is present. Clotted AV fistula is present in the left arm. Dialysis access catheters are present in the right inguinal area. Skin is warm and dry without rash. Neurologic: He is awake and responsive, cranial nerves are intact, there are no motor or sensory deficits.  ED Treatments / Results  Labs (all labs ordered are listed, but only abnormal results are displayed) Labs Reviewed  CBC WITH DIFFERENTIAL/PLATELET - Abnormal; Notable for the following:       Result Value   WBC 11.1 (*)    RBC 4.04 (*)    Hemoglobin 11.2 (*)    HCT 35.5 (*)    RDW 19.2 (*)    Neutro Abs 9.8 (*)    Lymphs Abs 0.4 (*)    All other components within normal limits  BASIC METABOLIC PANEL - Abnormal; Notable for the following:    Potassium 7.2 (*)    Chloride 96 (*)    CO2 11 (*)    BUN 108 (*)    Creatinine, Ser 18.89 (*)    Calcium 8.4 (*)    GFR calc non Af Amer 2 (*)    GFR calc Af Amer 3 (*)    Anion gap 31 (*)    All other components within normal limits  I-STAT ARTERIAL BLOOD GAS, ED - Abnormal; Notable for the following:    pH, Arterial 7.194 (*)    pCO2 arterial 49.2 (*)    pO2, Arterial 257.0 (*)    Bicarbonate 19.0 (*)    Acid-base deficit 9.0 (*)    All other components within normal limits    EKG  EKG Interpretation  Date/Time:  Thursday August 29 2016 06:06:55 EST Ventricular Rate:  99 PR Interval:    QRS Duration: 169 QT Interval:  437 QTC Calculation: 561 R Axis:   137 Text Interpretation:  Sinus or ectopic atrial rhythm Nonspecific intraventricular conduction delay QRS widening worrisome for hyperkalemia When compared with ECG of 07/16/2016, ECG changes of hyperkalemia are now present Confirmed by [facility]  MD, Ersie Savino ([id number]) on 08/29/2016 6:23:37 AM       Radiology Dg Chest Port 1  View  Result Date: 08/29/2016 CLINICAL DATA:  Respiratory failure EXAM: PORTABLE CHEST 1 VIEW COMPARISON:  08/29/2016 at 06:14 FINDINGS: Endotracheal tube is 5 cm above the carina. Nasogastric tube extends into the stomach and beyond the inferior edge of the image. Central airspace opacities persist bilaterally  without significant interval change. IMPRESSION: Support equipment appears satisfactorily positioned. No significant interval change in the bilateral airspace opacities. Electronically Signed   By: Andreas [place] M.D.   On: 08/29/2016 07:01   Dg Chest Port 1 View  Result Date: 08/29/2016 CLINICAL DATA:  Dyspnea. EXAM: PORTABLE CHEST 1 VIEW COMPARISON:  07/17/2016 FINDINGS: Vascular and interstitial congestive changes. Central airspace opacity. Probable small effusions. Marked cardiomegaly. IMPRESSION: The findings likely represent congestive heart failure. Electronically Signed   By: Andreas [place] M.D.   On: 08/29/2016 06:27    Procedures CRITICAL CARE Performed by: [id number] Total critical care time: 90 minutes Critical care time was exclusive of separately billable procedures and treating other patients. Critical care was necessary to treat or prevent imminent or life-threatening deterioration. Critical care was time spent personally by me on the following activities: development of treatment plan with patient and/or surrogate as well as nursing, discussions with consultants, evaluation of patient's response to treatment, examination of patient, obtaining history from patient or surrogate, ordering and performing treatments and interventions, ordering and review of laboratory studies, ordering and review of radiographic studies, pulse oximetry and re-evaluation of patient's condition.  Procedure Name: Intubation Date/Time: 08/29/2016 6:35 AM Performed by: Roxanne Mins, Tylicia Sherman Pre-anesthesia Checklist: Patient identified, Emergency Drugs available, Suction available, Patient being  monitored and Timeout performed Oxygen Delivery Method: Non-rebreather mask Preoxygenation: Pre-oxygenation with 100% oxygen Intubation Type: Rapid sequence and IV induction Ventilation: Mask ventilation without difficulty Laryngoscope Size: Glidescope and 3 Grade View: Grade II Tube size: 7.5 mm Number of attempts: 2 (Vocal cords are very deep down-initial attempt slipped past cords into esophagus) Airway Equipment and Method: Lighted stylet Placement Confirmation: ETT inserted through vocal cords under direct vision,  Positive ETCO2,  CO2 detector and Breath sounds checked- equal and bilateral Tube secured with: ETT holder Dental Injury: Teeth and Oropharynx as per pre-operative assessment  Difficulty Due To: Difficulty was unanticipated       Medications Ordered in ED Medications  calcium gluconate 1 g in sodium chloride 0.9 % 100 mL IVPB (not administered)  sodium polystyrene (KAYEXALATE) 15 GM/60ML suspension 60 g (not administered)  albuterol (PROVENTIL,VENTOLIN) solution continuous neb (10 mg/hr Nebulization New Bag/Given 08/29/16 0617)  dextrose 50 % solution 50 mL (50 mLs Intravenous Given 08/29/16 0620)  sodium bicarbonate injection 50 mEq (50 mEq Intravenous Given 08/29/16 0620)  insulin aspart (novoLOG) injection 10 Units (10 Units Intravenous Given 08/29/16 0620)     Initial Impression / Assessment and Plan / ED Course  I have reviewed the triage vital signs and the nursing notes.  Pertinent labs & imaging results that were available during my care of the patient were reviewed by me and considered in my medical decision making (see chart for details).  Clinical Course    Acute dyspnea and a dialysis patient worrisome for pulmonary edema. ECG is worrisome for hyperkalemia. Have discussed case with Dr. Lorrene Reid of nephrology service who is arranging for emergent dialysis. In the meantime, he is being continued on albuterol and is given intravenous dextrose, insulin,  calcium, sodium bicarbonate as well as oral sodium polystyrene. Old records are reviewed, and I did not see any relevant past visits.  0630 Patient started becoming more obtunded and oxygen saturations started to drop, so he was moved to trauma bay and intubated.  Final Clinical Impressions(s) / ED Diagnoses   Final diagnoses:  Airway intubation performed without difficulty  Hyperkalemia  End-stage renal disease on hemodialysis (HCC)  Metabolic acidemia  Anemia in chronic  kidney disease, on chronic dialysis ([place])  Acute pulmonary edema (HCC)    New Prescriptions New Prescriptions   No medications on file     Delora Fuel, MD [id number]

## 2016-08-29 NOTE — Sedation Documentation (Addendum)
7.5 ETT placed at this time- 24 at lip; positive color change; bilat chest rise and fall,

## 2016-08-29 NOTE — Progress Notes (Signed)
[facility] Progress Note Patient Name: Newton Hajj DOB: [birth year] MRN: [id number]   Date of Service  08/29/2016  HPI/Events of Note  Needs GI prophylaxis  eICU Interventions  protonix ordered      Intervention Category Intermediate Interventions: Best-practice therapies (e.g. DVT, beta blocker, etc.)  Christinia Gully 08/29/2016, 8:09 PM

## 2016-08-29 NOTE — Consult Note (Signed)
Reason for Consult: To manage dialysis and dialysis related needs Referring Physician: [name] is an 65 y.o. male with PMhx significant for DM, HTN, COPD and ESRD- HD TTS at [facility], non compliance with HD.  Pt was noted to miss HD last Thursday, went on Saturday but left out 2.6 kg over, then proceeded to miss Tuesday and came in early this AM with MS change and abnormal EKG.  Had to be intubated- CXR with bilat infiltrates and cardiomegaly, K of 7.2.  Acidotic with pH 7.19Given calcium, bicarb, insulin/d50 and kayexalate now being admitted to ICU for urgent HD   Dialyzes at [place] 83. TTS, 4 hours- BFR 400 HD Bath 2K/2calc, Dialyzer 180, Heparin yes. Access PC but had AVG in leg placed 10/30. hectorol 2. Mircera 40 given 11/28  On renvela Recent labs hgb 10.1, calc 8.5, phos 6.3 PTH 537  Past Medical History:  Diagnosis Date  . Arthritis   . Asthma   . Cancer ([place])   . COPD (chronic obstructive pulmonary disease) ([place])   . Coronary artery disease   . Diabetes mellitus without complication ([place])   . Hypertension   . Renal disorder     Past Surgical History:  Procedure Laterality Date  . AV FISTULA PLACEMENT Left 07/22/2016   Procedure: INSERTION OF ARTERIOVENOUS (AV) LOOP GRAFT LEFT THIGH;  Surgeon: Waynetta Sandy, MD;  Location: [facility];  Service: Vascular;  Laterality: Left;  . INSERTION OF DIALYSIS CATHETER    . INSERTION OF DIALYSIS CATHETER N/[facility] 05/29/2015   Procedure: INSERTION OF DIALYSIS CATHETER;  Surgeon: Angelia Mould, MD;  Location: [place];  Service: Vascular;  Laterality: N/[facility];  . IR GENERIC HISTORICAL  07/16/2016   IR US GUIDE VASC ACCESS RIGHT 07/16/2016 Arne Cleveland, MD [facility]  . IR GENERIC HISTORICAL  07/16/2016   IR FLUORO GUIDE CV LINE RIGHT 07/16/2016 Arne Cleveland, MD [facility]  . IR GENERIC HISTORICAL  07/16/2016   IR US GUIDE VASC ACCESS RIGHT 07/16/2016 Arne Cleveland, MD [facility]  . IR GENERIC HISTORICAL   07/16/2016   IR VENO/EXT/UNI RIGHT 07/16/2016 Arne Cleveland, MD [facility]  . LIGATION OF ARTERIOVENOUS  FISTULA Left 07/17/2016   Procedure: LIGATION OF BRACHIOCEPHALIC ARTERIOVENOUS  FISTULA  Left arm;  Surgeon: Rosetta Posner, MD;  Location: [employer];  Service: Vascular;  Laterality: Left;  . NO PAST SURGERIES    . PERIPHERAL VASCULAR CATHETERIZATION N/[facility] 10/25/2015   Procedure: Dialysis/Perma Catheter Insertion;  Surgeon: Katha Cabal, MD;  Location: [facility];  Service: Cardiovascular;  Laterality: N/[facility];  . PERIPHERAL VASCULAR CATHETERIZATION Left 02/22/2016   Procedure: [facility]/V Shuntogram/Fistulagram;  Surgeon: Algernon Huxley, MD;  Location: [place] [facility];  Service: Cardiovascular;  Laterality: Left;  . PERIPHERAL VASCULAR CATHETERIZATION Right 03/19/2016   Procedure: Dialysis/Perma Catheter Insertion;  Surgeon: Katha Cabal, MD;  Location: [place] [facility];  Service: Cardiovascular;  Laterality: Right;  . PERIPHERAL VASCULAR CATHETERIZATION N/[facility] 04/22/2016   Procedure: Dialysis/Perma Catheter Removal;  Surgeon: Algernon Huxley, MD;  Location: [facility];  Service: Cardiovascular;  Laterality: N/[facility];    Family History  Problem Relation Age of Onset  . Hypertension Other   . Diabetes Other     Social History:  reports that he has never smoked. He has never used smokeless tobacco. He reports that he uses drugs, including Marijuana. He reports that he does not drink alcohol.  Allergies:  Allergies  Allergen Reactions  .  Penicillins Other (See Comments)    Reaction:  Unknown     Medications: I have reviewed the patient's current medications.   Results for orders placed or performed during the hospital encounter of 08/29/16 (from the past 48 hour(s))  CBC with Differential     Status: Abnormal   Collection Time: 08/29/16  6:18 AM  Result Value Ref Range   WBC 11.1 (H) 4.0 - 10.5 K/uL   RBC 4.04 (L) 4.22 - 5.81 MIL/uL   Hemoglobin 11.2 (L) 13.0 - 17.0 g/dL    HCT 35.5 (L) 39.0 - 52.0 %   MCV 87.9 78.0 - 100.0 fL   MCH 27.7 26.0 - 34.0 pg   MCHC 31.5 30.0 - 36.0 g/dL   RDW 19.2 (H) 11.5 - 15.5 %   Platelets 242 150 - 400 K/uL   Neutrophils Relative % 89 %   Neutro Abs 9.8 (H) 1.7 - 7.7 K/uL   Lymphocytes Relative 3 %   Lymphs Abs 0.4 (L) 0.7 - 4.0 K/uL   Monocytes Relative 8 %   Monocytes Absolute 0.9 0.1 - 1.0 K/uL   Eosinophils Relative 0 %   Eosinophils Absolute 0.0 0.0 - 0.7 K/uL   Basophils Relative 0 %   Basophils Absolute 0.0 0.0 - 0.1 K/uL  Basic metabolic panel     Status: Abnormal   Collection Time: 08/29/16  6:18 AM  Result Value Ref Range   Sodium 138 135 - 145 mmol/L   Potassium 7.2 (HH) 3.5 - 5.1 mmol/L    Comment: CRITICAL RESULT CALLED TO, READ BACK BY AND VERIFIED WITH: [facility] K,RN 08/29/16 0656 WAYK    Chloride 96 (L) 101 - 111 mmol/L   CO2 11 (L) 22 - 32 mmol/L   Glucose, Bld 92 65 - 99 mg/dL   BUN 108 (H) 6 - 20 mg/dL   Creatinine, Ser 18.89 (H) 0.61 - 1.24 mg/dL   Calcium 8.4 (L) 8.9 - 10.3 mg/dL   GFR calc non Af Amer 2 (L) >60 mL/min   GFR calc Af Amer 3 (L) >60 mL/min    Comment: (NOTE) The eGFR has been calculated using the CKD EPI equation. This calculation has not been validated in all clinical situations. eGFR's persistently <60 mL/min signify possible Chronic Kidney Disease.    Anion gap 31 (H) 5 - 15  Triglycerides     Status: None   Collection Time: 08/29/16  6:18 AM  Result Value Ref Range   Triglycerides 99 <150 mg/dL  Protime-INR     Status: Abnormal   Collection Time: 08/29/16  6:19 AM  Result Value Ref Range   Prothrombin Time 26.5 (H) 11.4 - 15.2 seconds   INR 2.39   I-Stat arterial blood gas, ED     Status: Abnormal   Collection Time: 08/29/16  7:04 AM  Result Value Ref Range   pH, Arterial 7.194 (LL) 7.350 - 7.450   pCO2 arterial 49.2 (H) 32.0 - 48.0 mmHg   pO2, Arterial 257.0 (H) 83.0 - 108.0 mmHg   Bicarbonate 19.0 (L) 20.0 - 28.0 mmol/L   TCO2 20 0 - 100 mmol/L   O2 Saturation  100.0 %   Acid-base deficit 9.0 (H) 0.0 - 2.0 mmol/L   Patient temperature 98.6 F    Collection site RADIAL, ALLEN'S TEST ACCEPTABLE    Drawn by RT    Sample type ARTERIAL    Comment NOTIFIED PHYSICIAN     Dg Chest Port 1 View  Result Date: 08/29/2016 CLINICAL DATA:  Respiratory failure EXAM: PORTABLE CHEST 1 VIEW COMPARISON:  08/29/2016 at 06:14 FINDINGS: Endotracheal tube is 5 cm above the carina. Nasogastric tube extends into the stomach and beyond the inferior edge of the image. Central airspace opacities persist bilaterally without significant interval change. IMPRESSION: Support equipment appears satisfactorily positioned. No significant interval change in the bilateral airspace opacities. Electronically Signed   By: Andreas Newport M.D.   On: 08/29/2016 07:01   Dg Chest Port 1 View  Result Date: 08/29/2016 CLINICAL DATA:  Dyspnea. EXAM: PORTABLE CHEST 1 VIEW COMPARISON:  07/17/2016 FINDINGS: Vascular and interstitial congestive changes. Central airspace opacity. Probable small effusions. Marked cardiomegaly. IMPRESSION: The findings likely represent congestive heart failure. Electronically Signed   By: Andreas Newport M.D.   On: 08/29/2016 06:27    ROS: unable to obtain- pt intubated Blood pressure 121/94, pulse 99, temperature 97 F (36.1 C), temperature source Axillary, resp. rate 20, height '5\' 6"'$  (1.676 m), SpO2 92 %. General appearance: sedated and intubated Resp: diminished breath sounds bilaterally and rales bilaterally Cardio: irreg GI: soft, non-tender; bowel sounds normal; no masses,  no organomegaly Extremities: edema 1 plus PC in place right femoral also thigh AVG that is not completely healed on left   Assessment/Plan: 65 year old BM with ESRD- presenting with pulm edema, hyperkalemia and acidosis related to missed HD treatments 1 pulm edema- due to missed HD- emergent HD in ICU- intubated due to that and also MS change 2 ESRD: normally TTS at [facility]- missed 2 of  last 3 treatments.  Emergent HD today, then likely tomorrow as well.  Will use femoral PC Wanted use thigh AVG.but has open wound still on it but appears clean.   Am going to do IHD in order to get K down acutely, would only do CRRT if becomes hemodynamically unstable  3 Hypertension: is supposedly on meds as OP- hold any BP meds, UF as able with HD 4. Anemia of ESRD: not an issue at this time- on ESA as OP 5. Metabolic Bone Disease: continue hectorol with HD - hold binders (renvela )until extubated    Noah [facility] 08/29/2016, 9:16 AM

## 2016-08-29 NOTE — Progress Notes (Signed)
On arrival patient was breath stacking/auto peeping on the vent. Pt was lavage and suctioned and the filters were changed and that fix the problem. . No distress noted with the patient and patient had no signs of decompensation . Pt was hemodynamically stable throughout, RT will monitor

## 2016-08-29 NOTE — Progress Notes (Signed)
[facility] Progress Note Patient Name: Noah [facility] DOB: 09-02-1951 MRN: [id number]   Date of Service  08/29/2016  HPI/Events of Note  Incontinent of urine  eICU Interventions  Place foley for now, reassess need in am      Intervention Category Minor Interventions: Routine modifications to care plan (e.g. PRN medications for pain, fever)  Christinia Gully 08/29/2016, 4:52 PM

## 2016-08-30 DIAGNOSIS — I132 Hypertensive heart and chronic kidney disease with heart failure and with stage 5 chronic kidney disease, or end stage renal disease: Secondary | ICD-10-CM | POA: Diagnosis not present

## 2016-08-30 DIAGNOSIS — E875 Hyperkalemia: Secondary | ICD-10-CM | POA: Diagnosis not present

## 2016-08-30 DIAGNOSIS — Z992 Dependence on renal dialysis: Secondary | ICD-10-CM

## 2016-08-30 DIAGNOSIS — J9601 Acute respiratory failure with hypoxia: Secondary | ICD-10-CM | POA: Diagnosis not present

## 2016-08-30 DIAGNOSIS — R0603 Acute respiratory distress: Secondary | ICD-10-CM | POA: Diagnosis not present

## 2016-08-30 DIAGNOSIS — N186 End stage renal disease: Secondary | ICD-10-CM | POA: Diagnosis not present

## 2016-08-30 LAB — GLUCOSE, CAPILLARY
GLUCOSE-CAPILLARY: 89 mg/dL (ref 65–99)
GLUCOSE-CAPILLARY: 88 mg/dL (ref 65–99)
GLUCOSE-CAPILLARY: 105 mg/dL — AB (ref 65–99)
GLUCOSE-CAPILLARY: 98 mg/dL (ref 65–99)
Glucose-Capillary: 96 mg/dL (ref 65–99)

## 2016-08-30 LAB — RENAL FUNCTION PANEL
ALBUMIN: 3.9 g/dL (ref 3.5–5.0)
ANION GAP: 21 — AB (ref 5–15)
BUN: 50 mg/dL — AB (ref 6–20)
CO2: 23 mmol/L (ref 22–32)
Calcium: 8.6 mg/dL — ABNORMAL LOW (ref 8.9–10.3)
Chloride: 92 mmol/L — ABNORMAL LOW (ref 101–111)
Creatinine, Ser: 12.06 mg/dL — ABNORMAL HIGH (ref 0.61–1.24)
GFR calc Af Amer: 4 mL/min — ABNORMAL LOW (ref >60)
GFR, EST NON AFRICAN AMERICAN: 4 mL/min — AB (ref >60)
Glucose, Bld: 113 mg/dL — ABNORMAL HIGH (ref 65–99)
PHOSPHORUS: 8.3 mg/dL — AB (ref 2.5–4.6)
POTASSIUM: 4.4 mmol/L (ref 3.5–5.1)
Sodium: 136 mmol/L (ref 135–145)

## 2016-08-30 LAB — BASIC METABOLIC PANEL
Anion gap: 22 — ABNORMAL HIGH (ref 5–15)
BUN: 47 mg/dL — AB (ref 6–20)
CALCIUM: 8.9 mg/dL (ref 8.9–10.3)
CO2: 23 mmol/L (ref 22–32)
CREATININE: 11.01 mg/dL — AB (ref 0.61–1.24)
Chloride: 93 mmol/L — ABNORMAL LOW (ref 101–111)
GFR calc non Af Amer: 4 mL/min — ABNORMAL LOW (ref >60)
GFR, EST AFRICAN AMERICAN: 5 mL/min — AB (ref >60)
Glucose, Bld: 55 mg/dL — ABNORMAL LOW (ref 65–99)
Potassium: 4.8 mmol/L (ref 3.5–5.1)
Sodium: 138 mmol/L (ref 135–145)

## 2016-08-30 LAB — PHOSPHORUS: PHOSPHORUS: 8.2 mg/dL — AB (ref 2.5–4.6)

## 2016-08-30 LAB — CBC
HEMATOCRIT: 29.9 % — AB (ref 39.0–52.0)
HEMOGLOBIN: 9.6 g/dL — AB (ref 13.0–17.0)
MCH: 27.4 pg (ref 26.0–34.0)
MCHC: 32.1 g/dL (ref 30.0–36.0)
MCV: 85.4 fL (ref 78.0–100.0)
Platelets: 151 10*3/uL (ref 150–400)
RBC: 3.5 MIL/uL — ABNORMAL LOW (ref 4.22–5.81)
RDW: 19.3 % — ABNORMAL HIGH (ref 11.5–15.5)
WBC: 10.9 10*3/uL — AB (ref 4.0–10.5)

## 2016-08-30 LAB — PROTIME-INR
INR: 2.31
PROTHROMBIN TIME: 25.8 s — AB (ref 11.4–15.2)

## 2016-08-30 LAB — MAGNESIUM: Magnesium: 2 mg/dL (ref 1.7–2.4)

## 2016-08-30 MED ORDER — HEPARIN SODIUM (PORCINE) 1000 UNIT/ML DIALYSIS
20.0000 [IU]/kg | INTRAMUSCULAR | Status: DC | PRN
Start: 2016-08-30 — End: 2016-08-31

## 2016-08-30 MED ORDER — FENTANYL CITRATE (PF) 100 MCG/2ML IJ SOLN
25.0000 ug | INTRAMUSCULAR | Status: DC | PRN
  Administered 2016-08-31: 50 ug via INTRAVENOUS
  Filled 2016-08-30: qty 2

## 2016-08-30 MED ORDER — INSULIN ASPART 100 UNIT/ML ~~LOC~~ SOLN: 0.0000 [IU] | SUBCUTANEOUS | Status: DC

## 2016-08-30 MED ORDER — ALBUMIN HUMAN 25 % IV SOLN
25.0000 g | Freq: Once | INTRAVENOUS | Status: AC
  Administered 2016-08-30: 25 g via INTRAVENOUS

## 2016-08-30 NOTE — Progress Notes (Signed)
Patient ID: [facility], male   DOB: 05/28/1951, 65 y.o.   MRN: [id number]  [facility] Progress Note   Assessment/ Plan:   1. Volume overload/hyperkalemia/acute encephalopathy: Likely secondary to missed hemodialysis and dietary indiscretion/uremic symptoms. Status post hemodialysis yesterday and then plan for hemodialysis again today. 2. ESRD: previously with missed dialysis treatments, status post emergent hemodialysis yesterday and reordered for dialysis again today. 3. Anemia: Low hemoglobin-restarted back on Aranesp yesterday, no additional overt loss noted. 4. CKD-MBD: Binders on hold at this time without any reliable enteral nutrition-continue to monitor phosphorus levels and restart when able to take sufficiently orally. 5. Thrombosed left thigh AV graft: Will consult with interventional radiology for thrombectomy possibly after patient extubated. 6. Hypertension: Intermittently low blood pressures requiring IV albumin. Continue to monitor with ultrafiltration  Subjective:   No acute events overnight    Objective:   BP 98/77 (BP Location: Right Arm)   Pulse (!) 115   Temp 99 F (37.2 C) (Oral)   Resp 20   Ht 5' 6" (1.676 m)   Wt 85.6 kg (188 lb 11.4 oz)   SpO2 100%   BMI 30.46 kg/m   Physical Exam: Gen:Intubated, sedated CVS: Pulse regular tachycardia, S1 and S2 with ejection systolic murmur Resp: Anteriorly clear to auscultation, no distinct rales or rhonchi Abd: Soft, obese, nontender Ext: No lower extremity edema. Right femoral tunneled hemodialysis catheter with no palpable thrill or bruit over left thigh graft-appears thrombosed  Labs: BMET  Recent Labs Lab 08/29/16 0618 08/29/16 1016 08/29/16 1610 08/29/16 2136 08/30/16 0720  NA 138 138 140 138 136  K 7.2* 5.4* 5.5* 4.8 4.4  CL 96* 94* 101 93* 92*  CO2 11* 15* 16* 23 23  GLUCOSE 92 96 82 55* 113*  BUN 108* 112* 48* 47* 50*  CREATININE 18.89* 19.67* 10.57* 11.01* 12.06*  CALCIUM 8.4*  8.5* 9.4 8.9 8.6*  PHOS  --   --   --   --  8.2*  8.3*   CBC  Recent Labs Lab 08/29/16 0618 08/30/16 0720  WBC 11.1* 10.9*  NEUTROABS 9.8*  --   HGB 11.2* 9.6*  HCT 35.5* 29.9*  MCV 87.9 85.4  PLT 242 151   Medications:    . amiodarone  400 mg Oral Daily  . aspirin  81 mg Oral Daily  . chlorhexidine gluconate (MEDLINE KIT)  15 mL Mouth Rinse BID  . darbepoetin (ARANESP) injection - DIALYSIS  40 mcg Intravenous Q Thu-HD  . doxercalciferol  2 mcg Intravenous Q T,Th,Sa-HD  . heparin  5,000 Units Subcutaneous Q8H  . ipratropium-albuterol  3 mL Nebulization Q6H  . lamoTRIgine  25 mg Oral BID  . levothyroxine  50 mcg Intravenous QAC breakfast  . mouth rinse  15 mL Mouth Rinse QID  . mirtazapine  15 mg Oral QHS  . pantoprazole (PROTONIX) IV  40 mg Intravenous Q24H  . Warfarin - Pharmacist Dosing Inpatient   Does not apply q1800    , MD 08/30/2016, 9:04 AM   

## 2016-08-30 NOTE — Progress Notes (Signed)
[facility] for warfarin >> heparin  Indication: atrial fibrillation  Allergies  Allergen Reactions  . Penicillins Other (See Comments)    Reaction:  Unknown     Patient Measurements: Height: 5\' 6"  (167.6 cm) Weight: 188 lb 11.4 oz (85.6 kg) IBW/kg (Calculated) : 63.8   Vital Signs: Temp: 99 F (37.2 C) (12/08 0822) Temp Source: Oral (12/08 0822) BP: 84/70 (12/08 1046) Pulse Rate: 109 (12/08 1046)  Labs:  Recent Labs  08/29/16 0618 08/29/16 0619 08/29/16 1016 08/29/16 1423 08/29/16 1610 08/29/16 2136 08/29/16 2307 08/30/16 0720  HGB 11.2*  --   --   --   --   --   --  9.6*  HCT 35.5*  --   --   --   --   --   --  29.9*  PLT 242  --   --   --   --   --   --  151  LABPROT  --  26.5*  --   --   --   --   --  25.8*  INR  --  2.39  --   --   --   --   --  2.31  CREATININE 18.89*  --  19.67*  --  10.57* 11.01*  --  12.06*  TROPONINI  --   --  0.04* 0.04*  --   --  0.04*  --     Estimated Creatinine Clearance: 6.3 mL/min (by C-G formula based on SCr of 12.06 mg/dL (H)).   Medical History: Past Medical History:  Diagnosis Date  . Arthritis   . Asthma   . Cancer ([place])   . COPD (chronic obstructive pulmonary disease) ([place])   . Coronary artery disease   . Diabetes mellitus without complication ([place])   . Hypertension   . Renal disorder     Assessment: 76 yoF with PMHx of atrial fibrillation on warfarin admitted with respiratory failure. Warfarin currently on hold due to need for thrombectomy of left thigh AV graft. Planning to transition to heparin infusion when INR < 2. Currently INR 2.31 with stable CBC.    Goal of Therapy:  INR 2-3 Monitor platelets by anticoagulation protocol: Yes   Plan:  1. Transition to heparin infusion when INR < 2 2. Recheck INR in am   Vincenza Hews, PharmD, BCPS 08/30/2016, 10:53 AM Pager: [phone]

## 2016-08-30 NOTE — H&P (Signed)
PULMONARY / CRITICAL CARE MEDICINE   Name: [facility] MRN: [id number] DOB: 1950/12/08    ADMISSION DATE:  08/29/2016 CONSULTATION DATE:  08/29/2016  REFERRING MD:  Dr. Roxanne Mins   CHIEF COMPLAINT:  Dyspnea, Hyperkalemia   SUBJECTIVE:  Sedated on vent   VITAL SIGNS: BP 98/77 (BP Location: Right Arm)   Pulse (!) 115   Temp 99 F (37.2 C) (Oral)   Resp 20   Ht 5\' 6"  (1.676 m)   Wt 188 lb 11.4 oz (85.6 kg)   SpO2 100%   BMI 30.46 kg/m   HEMODYNAMICS:    VENTILATOR SETTINGS: Vent Mode: PRVC FiO2 (%):  [40 %] 40 % Set Rate:  [14 bmp] 14 bmp Vt Set:  [510 mL] 510 mL PEEP:  [5 cmH20] 5 cmH20 Pressure Support:  [10 cmH20] 10 cmH20 Plateau Pressure:  [13 cmH20-16 cmH20] 16 cmH20  INTAKE / OUTPUT: I/O last 3 completed shifts: In: 661.9 [I.V.:281.9; Other:10; NG/GT:160; IV Piggyback:210] Out: 3283 [Urine:33; Other:3100; Stool:150]  PHYSICAL EXAMINATION: General: Adult male, sedated on vent  Neuro: Sedated, moves all extremities to physical stimulation and voice   HEENT: normocephalic , ET in place  Cardiovascular: s1/s2, irregular rhythm, no MRG  Lungs clear and unlabored. Equal chest rise Abdomen: non-distended, active bowel sounds  Musculoskeletal: no deformities  Skin: intact, dry, HD cath to right femoral   LABS:  BMET  Recent Labs Lab 08/29/16 1610 08/29/16 2136 08/30/16 0720  NA 140 138 136  K 5.5* 4.8 4.4  CL 101 93* 92*  CO2 16* 23 23  BUN 48* 47* 50*  CREATININE 10.57* 11.01* 12.06*  GLUCOSE 82 55* 113*    Electrolytes  Recent Labs Lab 08/29/16 1610 08/29/16 2136 08/30/16 0720  CALCIUM 9.4 8.9 8.6*  MG  --   --  2.0  PHOS  --   --  8.2*  8.3*    CBC  Recent Labs Lab 08/29/16 0618 08/30/16 0720  WBC 11.1* 10.9*  HGB 11.2* 9.6*  HCT 35.5* 29.9*  PLT 242 151    Coag's  Recent Labs Lab 08/29/16 0619 08/30/16 0720  INR 2.39 2.31    Sepsis Markers No results for input(s): LATICACIDVEN, PROCALCITON, O2SATVEN in the last  168 hours.  ABG  Recent Labs Lab 08/29/16 0704 08/29/16 1000  PHART 7.194* 7.203*  PCO2ART 49.2* 36.8  PO2ART 257.0* 91.1    Liver Enzymes  Recent Labs Lab 08/30/16 0720  ALBUMIN 3.9    Cardiac Enzymes  Recent Labs Lab 08/29/16 1016 08/29/16 1423 08/29/16 2307  TROPONINI 0.04* 0.04* 0.04*    Glucose  Recent Labs Lab 08/29/16 2025 08/29/16 2345 08/30/16 0352 08/30/16 0820  GLUCAP 84 83 96 89    Imaging No results found.   STUDIES:  12/7 CXR > Vascular and interstitial congestive changes. Small effusions. Marked Cardiomegaly.   CULTURES:   ANTIBIOTICS:   SIGNIFICANT EVENTS: 10/24-10/31 > Hospital Admission for SOB, pulm edema in sitting of missed dialysis session  10/30 > Left femoral AV loop Graft   LINES/TUBES: Left femoral AV loop Graft 10/30 Right Femoral permcath 10/24 >  ETT 12/7 >   DISCUSSION: 65 year old male presents to ED 12/7 in acute on chronic respiratory failure secondary to pulmonary edema. States the he missed dialysis on Tuesday, currently potassium 7.2 with wide complex EKG changes. In ED required intubated. Got 3 liters off yesterday. Now weaning. A little hypotensive on HD today but think this is likely a mix of volume status and sedation. Goal  for today is to cont SBT and hope to extubate later today if MS will allow. .   ASSESSMENT / PLAN:  PULMONARY A: Acute on Chronic Respiratory Failure  H/O COPD, OSA (non-compliant with CPAP)  P:   PSV/SBT-->Wean as tolerated  Duonebs q6H PRN albuterol  Hope to extubate later today   CARDIOVASCULAR A:  Acute on Chronic Diastolic Heart Failure  Chronically Occluded left Subclavian vein  H/O HTN, Afib (on home Amiodarone/Coumadin), CAD  P:  Cardiac Monitoring  Count coumadin Restarted home amiodarone  Restart Aspirin  Hold home BP meds   RENAL A:   Acute on Chronic ESRD  Hyperkalemia - s/p Kayexalate, Temporization in ED  HD dependent T/R/S H/O Right Kidney Mass  (Being follow outpatient) P:   Nephrology on board  Dialysis today again Trend BMP  GASTROINTESTINAL A:   NPO P:   PPI  HEMATOLOGIC A:   Anemia of CKD  P:  Trend CBC   INFECTIOUS A:   No issues - does have HD cath to right femoral (does not look infected)  P:   Trend Fever and WBC curve    ENDOCRINE A:    Type 2 DM  Hypothyroidism  P:   Continue home synthroid  SSI Q4H Glucose Checks   NEUROLOGIC A:   Drug Induced Encephalopathy  H/O Bipolar disorder, depression P:   Wean propofol to off Continuing home remeron and lamicatal  PRN fentanyl as needed to achieve RASS  RASS goal: 0/-1    FAMILY  - Updates: no family at bedside 12/7   - Inter-disciplinary family meet or Palliative Care meeting due by:  09/05/2016    [name] ACNP-BC [place] Pager # [phone] OR # [phone] if no answer

## 2016-08-30 NOTE — Procedures (Signed)
Patient seen on Hemodialysis. QB 350, UF goal 3.5L Treatment adjusted as needed.  Elmarie Shiley MD [facility]. Office # [phone] Pager # [phone] 9:14 AM

## 2016-08-30 NOTE — Progress Notes (Signed)
PULMONARY / CRITICAL CARE MEDICINE   Name: Noah Lewis MRN: [id number] DOB: [birth year]    ADMISSION DATE:  08/29/2016 CONSULTATION DATE:  08/29/2016  REFERRING MD:  Dr. Roxanne Mins   CHIEF COMPLAINT:  Dyspnea, Hyperkalemia   SUBJECTIVE:  Sedated on vent  Temp:  [97 F (36.1 C)-99.5 F (37.5 C)] 99 F (37.2 C) (12/08 0822) Pulse Rate:  [84-116] 109 (12/08 1015) Resp:  [13-28] 17 (12/08 1015) BP: (57-141)/(54-95) 97/71 (12/08 1015) SpO2:  [89 %-100 %] 100 % (12/08 1015) FiO2 (%):  [40 %] 40 % (12/08 0756) Weight:  [186 lb 1.1 oz (84.4 kg)-193 lb 9 oz (87.8 kg)] 188 lb 11.4 oz (85.6 kg) (12/08 0700)   Intake/Output Summary (Last 24 hours) at 08/30/16 1026 Last data filed at 08/30/16 0900  Gross per 24 hour  Intake           616.99 ml  Output             3283 ml  Net         -2666.01 ml  Vent Mode: PRVC FiO2 (%):  [40 %] 40 % Set Rate:  [14 bmp] 14 bmp Vt Set:  [510 mL] 510 mL PEEP:  [5 cmH20] 5 cmH20 Pressure Support:  [10 cmH20] 10 cmH20 Plateau Pressure:  [13 cmH20-16 cmH20] 16 cmH20   PHYSICAL EXAMINATION: General: Adult male, sedated on vent  Neuro: Sedated, moves all extremities to physical stimulation and voice   HEENT: normocephalic , ET in place  Cardiovascular: s1/s2, irregular rhythm, no MRG  Lungs clear and unlabored. Equal chest rise Abdomen: non-distended, active bowel sounds  Musculoskeletal: no deformities  Skin: intact, dry, HD cath to right femoral   LABS: CBC Recent Labs     08/29/16  0618  08/30/16  0720  WBC  11.1*  10.9*  HGB  11.2*  9.6*  HCT  35.5*  29.9*  PLT  242  151    Coag's Recent Labs     08/29/16  0619  08/30/16  0720  INR  2.39  2.31    BMET Recent Labs     08/29/16  1610  08/29/16  2136  08/30/16  0720  NA  140  138  136  K  5.5*  4.8  4.4  CL  101  93*  92*  CO2  16*  23  23  BUN  48*  47*  50*  CREATININE  10.57*  11.01*  12.06*  GLUCOSE  82  55*  113*    Electrolytes Recent Labs     08/29/16  1610   08/29/16  2136  08/30/16  0720  CALCIUM  9.4  8.9  8.6*  MG   --    --   2.0  PHOS   --    --   8.2*  8.3*    Sepsis Markers No results for input(s): PROCALCITON, O2SATVEN in the last 72 hours.  Invalid input(s): LACTICACIDVEN  ABG Recent Labs     08/29/16  0704  08/29/16  1000  PHART  7.194*  7.203*  PCO2ART  49.2*  36.8  PO2ART  257.0*  91.1    Liver Enzymes Recent Labs     08/30/16  0720  ALBUMIN  3.9    Cardiac Enzymes Recent Labs     08/29/16  1016  08/29/16  1423  08/29/16  2307  TROPONINI  0.04*  0.04*  0.04*    Glucose Recent Labs     08/29/16  2025  08/29/16  2345  08/30/16  0352  08/30/16  0820  GLUCAP  84  83  96  89    Imaging Dg Chest Port 1 View  Result Date: 08/29/2016 CLINICAL DATA:  Respiratory failure EXAM: PORTABLE CHEST 1 VIEW COMPARISON:  08/29/2016 at 06:14 FINDINGS: Endotracheal tube is 5 cm above the carina. Nasogastric tube extends into the stomach and beyond the inferior edge of the image. Central airspace opacities persist bilaterally without significant interval change. IMPRESSION: Support equipment appears satisfactorily positioned. No significant interval change in the bilateral airspace opacities. Electronically Signed   By: Andreas Newport M.D.   On: 08/29/2016 07:01   Dg Chest Port 1 View  Result Date: 08/29/2016 CLINICAL DATA:  Dyspnea. EXAM: PORTABLE CHEST 1 VIEW COMPARISON:  07/17/2016 FINDINGS: Vascular and interstitial congestive changes. Central airspace opacity. Probable small effusions. Marked cardiomegaly. IMPRESSION: The findings likely represent congestive heart failure. Electronically Signed   By: Andreas Newport M.D.   On: 08/29/2016 06:27      STUDIES:  12/7 CXR > Vascular and interstitial congestive changes. Small effusions. Marked Cardiomegaly.   CULTURES:   ANTIBIOTICS:   SIGNIFICANT EVENTS: 10/24-10/31 > Hospital Admission for SOB, pulm edema in sitting of missed dialysis session  10/30 >  Left femoral AV loop Graft   LINES/TUBES: Left femoral AV loop Graft 10/30 Right Femoral permcath 10/24 >  ETT 12/7 >   DISCUSSION: 65 year old male presents to ED 12/7 in acute on chronic respiratory failure secondary to pulmonary edema. States the he missed dialysis on Tuesday, currently potassium 7.2 with wide complex EKG changes. In ED required intubated. Got 3 liters off yesterday. Now weaning. A little hypotensive on HD today but think this is likely a mix of volume status and sedation. Goal for today is to cont SBT and hope to extubate later today if MS will allow. .   ASSESSMENT / PLAN:  PULMONARY A: Acute on Chronic Respiratory Failure  H/O COPD, OSA (non-compliant with CPAP)  P:   PSV/SBT-->Wean as tolerated  Duonebs q6H PRN albuterol  Hope to extubate later today   CARDIOVASCULAR A:  Acute on Chronic Diastolic Heart Failure  Chronically Occluded left Subclavian vein  H/O HTN, Afib (on home Amiodarone/Coumadin), CAD  P:  Cardiac Monitoring  Count anticoagulation Restarted home amiodarone  Restart Aspirin  Hold home BP meds   RENAL A:   Acute on Chronic ESRD  Hyperkalemia - s/p Kayexalate, Temporization in ED  HD dependent T/R/S H/O Right Kidney Mass (Being follow outpatient) P:   Nephrology on board  Dialysis today again Trend BMP  GASTROINTESTINAL A:   NPO P:   PPI  HEMATOLOGIC A:   Anemia of CKD  Clotted AVF P:  Trend CBC  Heparin gtt Hold coumadin IR to address clotted graft on Monday   INFECTIOUS A:   No issues - does have HD cath to right femoral (does not look infected)  P:   Trend Fever and WBC curve    ENDOCRINE A:    Type 2 DM  Hypothyroidism  P:   Continue home synthroid  SSI Q4H Glucose Checks   NEUROLOGIC A:   Drug Induced Encephalopathy  H/O Bipolar disorder, depression P:   Wean propofol to off Continuing home remeron and lamicatal  PRN fentanyl as needed to achieve RASS  RASS goal: 0/-1    FAMILY  -  Updates: no family at bedside 12/7   - Inter-disciplinary family meet or Palliative Care meeting due by:  09/05/2016    [name] ACNP-BC [place] Pager # [phone] OR # [phone] if no answer

## 2016-08-30 NOTE — Progress Notes (Signed)
Pt placed on C/PS by MD.

## 2016-08-30 NOTE — Consult Note (Signed)
Chief Complaint: Patient was seen in consultation today for left thigh dialysis graft thrombolysis Chief Complaint  Patient presents with  . Altered Mental Status  . Vascular Access Problem    missed HD; last session was thursday 08/22/16   at the request of Dr Graylon Gunning  Referring Physician(s): Dr Graylon Gunning  Supervising Physician: Corrie Mckusick  Patient Status: [facility] - In-pt  History of Present Illness: Noah Lewis is a 65 y.o. male   Pt was admitted 12/7 with hypoxic event Hx ESRD; COPD HTN; DM Lives in assisted living facility Missed Tue dialysis before admission- so last dialysis was Sat 12/2 per chart Respiratory distress and EKG changes; hyperkalemia Has had recent admission with pulmonary edema 10/24- 10/31 New left thigh graft placed 07/22/16- Dr Donzetta Matters  Pt on vent at this point Encephalopathy  Using existing Rt fem temp catheter for dialysis In use now Dr Posey Pronto examines pt today for dialysis Left femoral AV Loop graft clotted Requesting declot procedure in IR Mon INR 2.3 today Also on vent Has existing functional Rt temp cath  CCM has stopped coumadin today---now on Hep  Past Medical History:  Diagnosis Date  . Arthritis   . Asthma   . Cancer ([place])   . COPD (chronic obstructive pulmonary disease) ([place])   . Coronary artery disease   . Diabetes mellitus without complication ([place])   . Hypertension   . Renal disorder     Past Surgical History:  Procedure Laterality Date  . AV FISTULA PLACEMENT Left 07/22/2016   Procedure: INSERTION OF ARTERIOVENOUS (AV) LOOP GRAFT LEFT THIGH;  Surgeon: Waynetta Sandy, MD;  Location: [place];  Service: Vascular;  Laterality: Left;  . INSERTION OF DIALYSIS CATHETER    . INSERTION OF DIALYSIS CATHETER N/A 05/29/2015   Procedure: INSERTION OF DIALYSIS CATHETER;  Surgeon: Angelia Mould, MD;  Location: [place];  Service: Vascular;  Laterality: N/A;  . IR GENERIC HISTORICAL  07/16/2016   IR US GUIDE VASC  ACCESS RIGHT 07/16/2016 Arne Cleveland, MD [facility]  . IR GENERIC HISTORICAL  07/16/2016   IR FLUORO GUIDE CV LINE RIGHT 07/16/2016 Arne Cleveland, MD [facility]  . IR GENERIC HISTORICAL  07/16/2016   IR US GUIDE VASC ACCESS RIGHT 07/16/2016 Arne Cleveland, MD [facility]  . IR GENERIC HISTORICAL  07/16/2016   IR VENO/EXT/UNI RIGHT 07/16/2016 Arne Cleveland, MD [facility]  . LIGATION OF ARTERIOVENOUS  FISTULA Left 07/17/2016   Procedure: LIGATION OF BRACHIOCEPHALIC ARTERIOVENOUS  FISTULA  Left arm;  Surgeon: Rosetta Posner, MD;  Location: [place];  Service: Vascular;  Laterality: Left;  . NO PAST SURGERIES    . PERIPHERAL VASCULAR CATHETERIZATION N/A 10/25/2015   Procedure: Dialysis/Perma Catheter Insertion;  Surgeon: Katha Cabal, MD;  Location: [place] [facility];  Service: Cardiovascular;  Laterality: N/A;  . PERIPHERAL VASCULAR CATHETERIZATION Left 02/22/2016   Procedure: A/V Shuntogram/Fistulagram;  Surgeon: Algernon Huxley, MD;  Location: [place] [facility];  Service: Cardiovascular;  Laterality: Left;  . PERIPHERAL VASCULAR CATHETERIZATION Right 03/19/2016   Procedure: Dialysis/Perma Catheter Insertion;  Surgeon: Katha Cabal, MD;  Location: [place] [facility];  Service: Cardiovascular;  Laterality: Right;  . PERIPHERAL VASCULAR CATHETERIZATION N/A 04/22/2016   Procedure: Dialysis/Perma Catheter Removal;  Surgeon: Algernon Huxley, MD;  Location: [place] [facility];  Service: Cardiovascular;  Laterality: N/A;    Allergies: Penicillins  Medications: Prior to Admission medications   Medication Sig Start Date End Date Taking? Authorizing Provider  acetaminophen (TYLENOL) 325 MG tablet Take 650 mg by mouth every 6 (six) hours as needed for mild pain, fever or headache.   Yes Historical Provider, MD  amiodarone (PACERONE) 400 MG tablet Take 1 tablet (400 mg total) by mouth daily. 12/14/15  Yes Srikar Sudini, MD  aspirin 81 MG chewable tablet Chew 1 tablet (81 mg total) by  mouth daily. 06/02/15  Yes Maryellen Pile, MD  b complex-vitamin c-folic acid (NEPHRO-VITE) 0.8 MG TABS tablet Take 1 tablet by mouth 2 (two) times daily.   Yes Historical Provider, MD  calcitRIOL (ROCALTROL) 0.5 MCG capsule Take 1 capsule (0.5 mcg total) by mouth every other day. 07/23/16  Yes Nita Sells, MD  Capsaicin (CAPSAGEL) 0.025 % GEL Apply 1 application topically 2 (two) times daily as needed (for lower back pain).   Yes Historical Provider, MD  lamoTRIgine (LAMICTAL) 25 MG tablet Take 2 tablets (50 mg total) by mouth daily. 07/23/16  Yes Nita Sells, MD  levothyroxine (SYNTHROID, LEVOTHROID) 100 MCG tablet Take 100 mcg by mouth daily before breakfast.   Yes Historical Provider, MD  loratadine (CLARITIN) 10 MG tablet Take 1 tablet (10 mg total) by mouth daily. 05/24/15  Yes Gerlene Fee, NP  metoprolol tartrate (LOPRESSOR) 25 MG tablet Take 0.5 tablets (12.5 mg total) by mouth 2 (two) times daily. 07/23/16  Yes Nita Sells, MD  mirtazapine (REMERON SOL-TAB) 15 MG disintegrating tablet Take 1 tablet (15 mg total) by mouth at bedtime. 07/23/16  Yes Nita Sells, MD  polyethylene glycol (MIRALAX / GLYCOLAX) packet Take 17 g by mouth daily as needed for mild constipation.    Yes Historical Provider, MD  saccharomyces boulardii (FLORASTOR) 250 MG capsule Take 250 mg by mouth 2 (two) times daily.   Yes Historical Provider, MD  sucroferric oxyhydroxide (VELPHORO) 500 MG chewable tablet Chew 500-1,000 mg by mouth See admin instructions. Give 1000mg  with meals, May also give 500 mg by mouth as needed with snacks.   Yes Historical Provider, MD  Travoprost, BAK Free, (TRAVATAN) 0.004 % SOLN ophthalmic solution Place 1 drop into both eyes at bedtime.   Yes Historical Provider, MD  warfarin (COUMADIN) 7.5 MG tablet Take 7.5 mg by mouth daily. 08/26/16 09/02/16 Yes Historical Provider, MD  ipratropium-albuterol (DUONEB) 0.5-2.5 (3) MG/3ML SOLN Take 3 mLs by nebulization every  6 (six) hours as needed (for wheezing/shortness of breath).    Historical Provider, MD     Family History  Problem Relation Age of Onset  . Hypertension Other   . Diabetes Other     Social History   Social History  . Marital status: Legally Separated    Spouse name: N/A  . Number of children: N/A  . Years of education: N/A   Social History Main Topics  . Smoking status: Never Smoker  . Smokeless tobacco: Never Used  . Alcohol use No  . Drug use:     Types: Marijuana     Comment: Occasionally  . Sexual activity: Not Asked   Other Topics Concern  . None   Social History Narrative  . None    Review of Systems: A 12 point ROS discussed and pertinent positives are indicated in the HPI above.  All other systems are negative.  Review of Systems  Constitutional: Positive for activity change, appetite change and fatigue. Negative for fever.  Respiratory: Positive for cough, shortness of breath and wheezing.   Gastrointestinal: Positive for diarrhea.    Vital Signs: BP (!) 65/42 (BP Location: Right Arm)  Pulse (!) 104   Temp 99 F (37.2 C) (Oral)   Resp 16   Ht 5\' 6"  (1.676 m)   Wt 188 lb 11.4 oz (85.6 kg)   SpO2 99%   BMI 30.46 kg/m   Physical Exam  Cardiovascular: Normal rate and regular rhythm.   Pulmonary/Chest: He has wheezes.  vent  Abdominal: Soft.  Musculoskeletal:  No response  Left thigh graft no pulse or thrill  Skin: Skin is warm and dry.  Psychiatric:  Consented with brother- [name] via phone  Nursing note and vitals reviewed.   Mallampati Score:  MD Evaluation Airway: Other (comments) Airway comments: vent Heart: WNL Abdomen: WNL Chest/ Lungs: WNL ASA  Classification: 3 Mallampati/Airway Score: Three  Imaging: Dg Chest Port 1 View  Result Date: 08/29/2016 CLINICAL DATA:  Respiratory failure EXAM: PORTABLE CHEST 1 VIEW COMPARISON:  08/29/2016 at 06:14 FINDINGS: Endotracheal tube is 5 cm above the carina. Nasogastric tube extends  into the stomach and beyond the inferior edge of the image. Central airspace opacities persist bilaterally without significant interval change. IMPRESSION: Support equipment appears satisfactorily positioned. No significant interval change in the bilateral airspace opacities. Electronically Signed   By: Andreas Newport M.D.   On: 08/29/2016 07:01   Dg Chest Port 1 View  Result Date: 08/29/2016 CLINICAL DATA:  Dyspnea. EXAM: PORTABLE CHEST 1 VIEW COMPARISON:  07/17/2016 FINDINGS: Vascular and interstitial congestive changes. Central airspace opacity. Probable small effusions. Marked cardiomegaly. IMPRESSION: The findings likely represent congestive heart failure. Electronically Signed   By: Andreas Newport M.D.   On: 08/29/2016 06:27    Labs:  CBC:  Recent Labs  07/20/16 1720 07/22/16 0554 08/29/16 0618 08/30/16 0720  WBC 8.2 9.6 11.1* 10.9*  HGB 11.4* 12.0* 11.2* 9.6*  HCT 35.0* 36.4* 35.5* 29.9*  PLT 167 201 242 151    COAGS:  Recent Labs  03/16/16 2350 07/17/16 0806  07/18/16 0501 07/19/16 0209 07/22/16 0554 07/23/16 0227 08/29/16 0619 08/30/16 0720  INR 1.36  --   < >  --   --  1.11 1.20 2.39 2.31  APTT 36 38*  --  29 30  --   --   --   --   < > = values in this interval not displayed.  BMP:  Recent Labs  08/29/16 1016 08/29/16 1610 08/29/16 2136 08/30/16 0720  NA 138 140 138 136  K 5.4* 5.5* 4.8 4.4  CL 94* 101 93* 92*  CO2 15* 16* 23 23  GLUCOSE 96 82 55* 113*  BUN 112* 48* 47* 50*  CALCIUM 8.5* 9.4 8.9 8.6*  CREATININE 19.67* 10.57* 11.01* 12.06*  GFRNONAA 2* 4* 4* 4*  GFRAA 2* 5* 5* 4*    LIVER FUNCTION TESTS:  Recent Labs  12/04/15 1544  03/16/16 2350 06/12/16 07/16/16 1417 07/17/16 0815 07/20/16 1721 07/22/16 0603 08/30/16 0720  BILITOT 0.8  --  0.1*  --  0.8  --   --   --   --   AST 33  --  33 15 18  --   --   --   --   ALT 24  --  34 16 14*  --   --   --   --   ALKPHOS 84  --  127* 113 109  --   --   --   --   PROT 7.7  --  7.4   --  8.1  --   --   --   --   ALBUMIN  3.4*  < > 3.7  --  3.9 3.8 3.5 3.6 3.9  < > = values in this interval not displayed.  TUMOR MARKERS: No results for input(s): AFPTM, CEA, CA199, CHROMGRNA in the last 8760 hours.  Assessment and Plan:  Left thigh dialysis graft clotted (placed 07/22/16) Using Rt femoral dialysis temp cath now Plan for possible extubation next 24-48 hrs per chart Off coumadin now; check INR 12/11 Will hold heparin am of procedure Scheduled for left thigh dialysis graft thrombolysis with possible angioplasty/stent placement 12/11 Risks and Benefits discussed with the patient's brother including, but not limited to bleeding, infection, vascular injury, pulmonary embolism, need for tunneled HD catheter placement or even death. All of the patient's brothers questions were answered, he is agreeable to proceed. Consent signed and in chart.  Thank you for this interesting consult.  I greatly enjoyed meeting Renold Suite and look forward to participating in their care.  A copy of this report was sent to the requesting provider on this date.  Electronically Signed: Muranda Coye A 08/30/2016, 10:43 AM   I spent a total of 40 Minutes    in face to face in clinical consultation, greater than 50% of which was counseling/coordinating care for left thigh dialysis graft declot

## 2016-08-30 NOTE — Progress Notes (Signed)
Patient is from [facility]. CSW following for disposition and return to SNF.          Lorrine Kin, MSW, LCSW [facility] ED/33M Clinical Social Worker [phone]

## 2016-08-30 NOTE — Progress Notes (Signed)
Initial Nutrition Assessment  INTERVENTION:   If unable to extubate recommend Nepro @ 40 ml/hr (960 ml/day) 30 ml Prostat BID Provides: 1928 kcal, 107 grams protein, and 697 ml H2O.    NUTRITION DIAGNOSIS:   Inadequate oral intake related to inability to eat as evidenced by NPO status.  GOAL:   Patient will meet greater than or equal to 90% of their needs  MONITOR:   Vent status, Labs, I & O's  REASON FOR ASSESSMENT:   Ventilator    ASSESSMENT:   Noah Lewis is an 65 y.o. male with PMhx significant for DM, HTN, COPD and ESRD- HD TTS at [facility], non compliance with HD.  Pt was noted to miss HD last Thursday, went on Saturday but left out 2.6 kg over, then proceeded to miss Tuesday and came in early this AM with MS change and abnormal EKG.  Had to be intubated- CXR with bilat infiltrates and cardiomegaly, K of 7.2.  Acidotic with pH 7.19  Unsure of dry weight therefore unable to determine if pt is actually obese.  Per RN mental status does not allow extubation at this time.   Patient is currently intubated on ventilator support MV: 11.4 L/min Temp (24hrs), Avg:99.2 F (37.3 C), Min:99 F (37.2 C), Max:99.5 F (37.5 C)  Labs reviewed: PO4 8.2 Nutrition-Focused physical exam completed. Findings are no fat depletion, no muscle depletion, and mild edema.     Diet Order:  Diet NPO time specified Diet NPO time specified Except for: Sips with Meds  Skin:  Reviewed, no issues  Last BM:  12/8  Height:   Ht Readings from Last 1 Encounters:  08/29/16 5\' 6"  (1.676 m)    Weight:   Wt Readings from Last 1 Encounters:  08/30/16 188 lb 11.4 oz (85.6 kg)    Ideal Body Weight:  64.5 kg  BMI:  Body mass index is 30.46 kg/m.  Estimated Nutritional Needs:   Kcal:  1928  Protein:  105-115 grams  Fluid:  1.2 L/day  EDUCATION NEEDS:   No education needs identified at this time  [place], [place], [place] Pager [phone] After Hours Pager

## 2016-08-30 NOTE — Progress Notes (Signed)
Patient having bloody oral secretions and bloody urine with blood clots. [place], MD aware. No new orders @ this time.

## 2016-08-31 ENCOUNTER — Encounter (HOSPITAL_COMMUNITY): Payer: Self-pay | Admitting: *Deleted

## 2016-08-31 DIAGNOSIS — I132 Hypertensive heart and chronic kidney disease with heart failure and with stage 5 chronic kidney disease, or end stage renal disease: Secondary | ICD-10-CM | POA: Diagnosis not present

## 2016-08-31 DIAGNOSIS — J9601 Acute respiratory failure with hypoxia: Secondary | ICD-10-CM | POA: Diagnosis not present

## 2016-08-31 DIAGNOSIS — R0603 Acute respiratory distress: Secondary | ICD-10-CM | POA: Diagnosis not present

## 2016-08-31 DIAGNOSIS — I5023 Acute on chronic systolic (congestive) heart failure: Secondary | ICD-10-CM

## 2016-08-31 DIAGNOSIS — I4891 Unspecified atrial fibrillation: Secondary | ICD-10-CM

## 2016-08-31 LAB — CBC WITH DIFFERENTIAL/PLATELET
Basophils Absolute: 0 10*3/uL (ref 0.0–0.1)
Basophils Relative: 0 %
EOS ABS: 0 10*3/uL (ref 0.0–0.7)
Eosinophils Relative: 0 %
HCT: 31.5 % — ABNORMAL LOW (ref 39.0–52.0)
HEMOGLOBIN: 10 g/dL — AB (ref 13.0–17.0)
LYMPHS ABS: 0.6 10*3/uL — AB (ref 0.7–4.0)
Lymphocytes Relative: 5 %
MCH: 27.6 pg (ref 26.0–34.0)
MCHC: 31.7 g/dL (ref 30.0–36.0)
MCV: 87 fL (ref 78.0–100.0)
MONO ABS: 1.5 10*3/uL — AB (ref 0.1–1.0)
Monocytes Relative: 12 %
NEUTROS PCT: 83 %
Neutro Abs: 10.2 10*3/uL — ABNORMAL HIGH (ref 1.7–7.7)
Platelets: 155 10*3/uL (ref 150–400)
RBC: 3.62 MIL/uL — ABNORMAL LOW (ref 4.22–5.81)
RDW: 18.9 % — ABNORMAL HIGH (ref 11.5–15.5)
WBC: 12.4 10*3/uL — ABNORMAL HIGH (ref 4.0–10.5)

## 2016-08-31 LAB — GLUCOSE, CAPILLARY
GLUCOSE-CAPILLARY: 90 mg/dL (ref 65–99)
GLUCOSE-CAPILLARY: 100 mg/dL — AB (ref 65–99)
GLUCOSE-CAPILLARY: 70 mg/dL (ref 65–99)
Glucose-Capillary: 100 mg/dL — ABNORMAL HIGH (ref 65–99)
Glucose-Capillary: 106 mg/dL — ABNORMAL HIGH (ref 65–99)

## 2016-08-31 LAB — RENAL FUNCTION PANEL
ALBUMIN: 3.9 g/dL (ref 3.5–5.0)
ANION GAP: 17 — AB (ref 5–15)
BUN: 26 mg/dL — ABNORMAL HIGH (ref 6–20)
CALCIUM: 8.8 mg/dL — AB (ref 8.9–10.3)
CO2: 25 mmol/L (ref 22–32)
Chloride: 93 mmol/L — ABNORMAL LOW (ref 101–111)
Creatinine, Ser: 7.86 mg/dL — ABNORMAL HIGH (ref 0.61–1.24)
GFR calc Af Amer: 7 mL/min — ABNORMAL LOW (ref >60)
GFR, EST NON AFRICAN AMERICAN: 6 mL/min — AB (ref >60)
GLUCOSE: 99 mg/dL (ref 65–99)
PHOSPHORUS: 7.5 mg/dL — AB (ref 2.5–4.6)
POTASSIUM: 3.9 mmol/L (ref 3.5–5.1)
SODIUM: 135 mmol/L (ref 135–145)

## 2016-08-31 LAB — PROTIME-INR
INR: 2.1
Prothrombin Time: 23.9 seconds — ABNORMAL HIGH (ref 11.4–15.2)

## 2016-08-31 LAB — MAGNESIUM: MAGNESIUM: 2.3 mg/dL (ref 1.7–2.4)

## 2016-08-31 MED ORDER — ORAL CARE MOUTH RINSE
15.0000 mL | Freq: Two times a day (BID) | OROMUCOSAL | Status: DC
  Administered 2016-09-01 – 2016-09-04 (×4): 15 mL via OROMUCOSAL

## 2016-08-31 MED ORDER — HEPARIN (PORCINE) IN NACL 100-0.45 UNIT/ML-% IJ SOLN
1700.0000 [IU]/h | INTRAMUSCULAR | Status: DC
  Administered 2016-08-31: 1200 [IU]/h via INTRAVENOUS
  Administered 2016-09-01: 1500 [IU]/h via INTRAVENOUS
  Administered 2016-09-02: 1700 [IU]/h via INTRAVENOUS
  Administered 2016-09-02: 1500 [IU]/h via INTRAVENOUS
  Administered 2016-09-03: 1700 [IU]/h via INTRAVENOUS
  Filled 2016-08-31 (×8): qty 250

## 2016-08-31 MED ORDER — DEXTROSE 50 % IV SOLN
INTRAVENOUS | Status: AC
  Filled 2016-08-31: qty 50

## 2016-08-31 MED ORDER — DEXTROSE 50 % IV SOLN
25.0000 mL | Freq: Once | INTRAVENOUS | Status: AC
  Administered 2016-08-31: 25 mL via INTRAVENOUS
  Filled 2016-08-31: qty 50

## 2016-08-31 NOTE — Progress Notes (Signed)
Patient ID: Noah Lewis, male   DOB: 1951/02/17, 65 y.o.   MRN: [id number]  [facility] Progress Note   Assessment/ Plan:   1. Volume overload/hyperkalemia/acute encephalopathy: Secondary to missed dialysis with uremia and volume excess-improved with hemodialysis since admission and being evaluated for possible extubation. 2. ESRD: previously with missed dialysis treatments, status post hemodialysis over the last 2 days. Usually on a Tuesday/Thursday/Saturday schedule-will order for hemodialysis again today to get him back on to schedule and to further increase the safety of thrombectomy on Monday the interventional radiology. 3. Anemia: Low hemoglobin-restarted back on Aranesp yesterday, no additional overt loss noted. 4. CKD-MBD: Binders on hold at this time without any reliable enteral nutrition-continue to monitor phosphorus levels and restart when able to take sufficiently orally. 5. Thrombosed left thigh AV graft: Appreciate assistance from interventional radiology with evaluating the patient and schedule him for thrombectomy of left thigh graft possibly Monday. 6. Hypertension: Intermittently low blood pressures requiring IV albumin. Continue to monitor with ultrafiltration  Subjective:   No acute events overnight-he motions towards his tube as a source of discomfort    Objective:   BP 113/85   Pulse 100   Temp 99.4 F (37.4 C) (Oral)   Resp 17   Ht '5\' 6"'$  (1.676 m)   Wt 82.7 kg (182 lb 5.1 oz)   SpO2 100%   BMI 29.43 kg/m   Physical Exam: TWS:[id number], awakens to calling out his name and nods/motions to questions CVS: Pulse regular tachycardia, S1 and S2 with ejection systolic murmur Resp: Anteriorly clear to auscultation, no distinct rales or rhonchi Abd: Soft, obese, nontender Ext: No lower extremity edema. Right femoral tunneled hemodialysis catheter with no palpable thrill or bruit over left thigh graft-appears thrombosed  Labs: BMET  Recent  Labs Lab 08/29/16 0618 08/29/16 1016 08/29/16 1610 08/29/16 2136 08/30/16 0720 08/31/16 0147  NA 138 138 140 138 136 135  K 7.2* 5.4* 5.5* 4.8 4.4 3.9  CL 96* 94* 101 93* 92* 93*  CO2 11* 15* 16* '23 23 25  '$ GLUCOSE 92 96 82 55* 113* 99  BUN 108* 112* 48* 47* 50* 26*  CREATININE 18.89* 19.67* 10.57* 11.01* 12.06* 7.86*  CALCIUM 8.4* 8.5* 9.4 8.9 8.6* 8.8*  PHOS  --   --   --   --  8.2*  8.3* 7.5*   CBC  Recent Labs Lab 08/29/16 0618 08/30/16 0720 08/31/16 0147  WBC 11.1* 10.9* 12.4*  NEUTROABS 9.8*  --  10.2*  HGB 11.2* 9.6* 10.0*  HCT 35.5* 29.9* 31.5*  MCV 87.9 85.4 87.0  PLT 242 151 155   Medications:    . amiodarone  400 mg Oral Daily  . aspirin  81 mg Oral Daily  . chlorhexidine gluconate (MEDLINE KIT)  15 mL Mouth Rinse BID  . darbepoetin (ARANESP) injection - DIALYSIS  40 mcg Intravenous Q Thu-HD  . doxercalciferol  2 mcg Intravenous Q T,Th,Sa-HD  . insulin aspart  0-9 Units Subcutaneous Q4H  . ipratropium-albuterol  3 mL Nebulization Q6H  . lamoTRIgine  25 mg Oral BID  . levothyroxine  50 mcg Intravenous QAC breakfast  . mouth rinse  15 mL Mouth Rinse QID  . mirtazapine  15 mg Oral QHS  . pantoprazole (PROTONIX) IV  40 mg Intravenous Q24H   Elmarie Shiley, MD 08/31/2016, 8:10 AM

## 2016-08-31 NOTE — Progress Notes (Signed)
[place] for warfarin >> heparin  Indication: atrial fibrillation  Allergies  Allergen Reactions  . Penicillins Other (See Comments)    Reaction:  Unknown     Patient Measurements: Height: 5\' 6"  (167.6 cm) Weight: 182 lb 5.1 oz (82.7 kg) IBW/kg (Calculated) : 63.8   Vital Signs: Temp: 99.5 F (37.5 C) (12/09 0906) Temp Source: Oral (12/09 0906) BP: 119/81 (12/09 0900) Pulse Rate: 111 (12/09 1000)  Labs:  Recent Labs  08/29/16 0618 08/29/16 0619 08/29/16 1016 08/29/16 1423  08/29/16 2136 08/29/16 2307 08/30/16 0720 08/31/16 0147  HGB 11.2*  --   --   --   --   --   --  9.6* 10.0*  HCT 35.5*  --   --   --   --   --   --  29.9* 31.5*  PLT 242  --   --   --   --   --   --  151 155  LABPROT  --  26.5*  --   --   --   --   --  25.8* 23.9*  INR  --  2.39  --   --   --   --   --  2.31 2.10  CREATININE 18.89*  --  19.67*  --   < > 11.01*  --  12.06* 7.86*  TROPONINI  --   --  0.04* 0.04*  --   --  0.04*  --   --   < > = values in this interval not displayed.  Estimated Creatinine Clearance: 9.5 mL/min (by C-G formula based on SCr of 7.86 mg/dL (H)).   Medical History: Past Medical History:  Diagnosis Date  . Arthritis   . Asthma   . Cancer ([place])   . COPD (chronic obstructive pulmonary disease) ([place])   . Coronary artery disease   . Diabetes mellitus without complication ([place])   . Hypertension   . Renal disorder     Assessment: 8 yoF with PMHx of atrial fibrillation on warfarin PTA admitted with respiratory failure. Warfarin currently on hold due to need for thrombectomy of left thigh AV graft that is tentatively scheduled for 12/11.  INR is trending down and is currently 2.1.  CBC stable with no reported bleeding.    Goal of Therapy:  Heparin level 0.3-0.7 units/ml Monitor platelets by anticoagulation protocol: Yes   Plan:  1. Suspect INR will be < 2 later today so will initiate heparin infusion at 1200 units/hr at  18:00 this evening.  2. Heparin level in am   Vincenza Hews, PharmD, BCPS 08/31/2016, 10:36 AM Pager: [phone]

## 2016-08-31 NOTE — Procedures (Signed)
Extubation Procedure Note  Patient Details:   Name: Mackey Bolon DOB: [birth year] MRN: [id number]   Airway Documentation:     Evaluation  O2 sats: stable throughout Complications: No apparent complications Patient did tolerate procedure well. Bilateral Breath Sounds: Clear, Diminished   Yes  Patient tolerated SBT on 0/5 for >30 min.  RSBI 25.  Extubate per Dr. Ashok Cordia.  Cuff leak positive.  Extubated to 5L [place].  Able to cough and speak.  Rayne Du Kynsley Whitehouse 08/31/2016, 9:06 PM

## 2016-08-31 NOTE — Progress Notes (Signed)
PCCM Attending Note: Patient seen after 40 minutes on SBT with PS 0/5 & FiO2 0.4. Checking for cuff leak. Hear rate 108 with RR 15 & Saturation normal. No distress. Able to pull TV >700cc. Plan for extubation with close monitoring.  Sonia Baller Ashok Cordia, M.D. [facility] Pulmonary & Critical Care Pager:  [phone] After 3pm or if no response, call [phone] 8:58 PM 08/31/16

## 2016-08-31 NOTE — Progress Notes (Signed)
PULMONARY / CRITICAL CARE MEDICINE   Name: Noah Lewis MRN: [id number] DOB: 1950-12-15    ADMISSION DATE:  08/29/2016 CONSULTATION DATE:  08/29/2016  REFERRING MD:  Dr. Roxanne Mins   CHIEF COMPLAINT:  Dyspnea, Hyperkalemia   SUBJECTIVE:  Weaning most of day , follows commands , sleepy No sedation since 12/8  . HD today   VITAL SIGNS: BP 108/74   Pulse 100   Temp 99.7 F (37.6 C) (Oral)   Resp 12   Ht 5\' 6"  (1.676 m)   Wt 81.2 kg (179 lb 0.2 oz)   SpO2 100%   BMI 28.89 kg/m   HEMODYNAMICS:    VENTILATOR SETTINGS: Vent Mode: PSV;CPAP FiO2 (%):  [30 %-40 %] 30 % Set Rate:  [14 bmp] 14 bmp Vt Set:  [510 mL] 510 mL PEEP:  [5 cmH20] 5 cmH20 Pressure Support:  [5 cmH20-8 cmH20] 8 cmH20 Plateau Pressure:  [7 cmH20-16 cmH20] 14 cmH20  INTAKE / OUTPUT: I/O last 3 completed shifts: In: 318.7 [I.V.:178.7; Other:10; NG/GT:30; IV Piggyback:100] Out: 2329 [Urine:55; Emesis/NG output:100; Other:2174]  PHYSICAL EXAMINATION: General: Adult male, sedated on vent  Neuro: Sedated, moves all extremities to physical stimulation and voice   HEENT: normocephalic , ET in place  Cardiovascular: s1/s2, irregular rhythm, no MRG  Lungs clear and unlabored. Equal chest rise Abdomen: non-distended, active bowel sounds  Musculoskeletal: no deformities  Skin: intact, dry, HD cath to right femoral   LABS:  BMET  Recent Labs Lab 08/29/16 2136 08/30/16 0720 08/31/16 0147  NA 138 136 135  K 4.8 4.4 3.9  CL 93* 92* 93*  CO2 23 23 25   BUN 47* 50* 26*  CREATININE 11.01* 12.06* 7.86*  GLUCOSE 55* 113* 99    Electrolytes  Recent Labs Lab 08/29/16 2136 08/30/16 0720 08/31/16 0147  CALCIUM 8.9 8.6* 8.8*  MG  --  2.0 2.3  PHOS  --  8.2*  8.3* 7.5*    CBC  Recent Labs Lab 08/29/16 0618 08/30/16 0720 08/31/16 0147  WBC 11.1* 10.9* 12.4*  HGB 11.2* 9.6* 10.0*  HCT 35.5* 29.9* 31.5*  PLT 242 151 155    Coag's  Recent Labs Lab 08/29/16 0619 08/30/16 0720  08/31/16 0147  INR 2.39 2.31 2.10    Sepsis Markers No results for input(s): LATICACIDVEN, PROCALCITON, O2SATVEN in the last 168 hours.  ABG  Recent Labs Lab 08/29/16 0704 08/29/16 1000  PHART 7.194* 7.203*  PCO2ART 49.2* 36.8  PO2ART 257.0* 91.1    Liver Enzymes  Recent Labs Lab 08/30/16 0720 08/31/16 0147  ALBUMIN 3.9 3.9    Cardiac Enzymes  Recent Labs Lab 08/29/16 1016 08/29/16 1423 08/29/16 2307  TROPONINI 0.04* 0.04* 0.04*    Glucose  Recent Labs Lab 08/30/16 1702 08/30/16 2022 08/30/16 2309 08/31/16 0326 08/31/16 0907 08/31/16 1252  GLUCAP 88 105* 98 100* 90 100*    Imaging No results found.   STUDIES:  12/7 CXR > Vascular and interstitial congestive changes. Small effusions. Marked Cardiomegaly.   CULTURES:   ANTIBIOTICS:   SIGNIFICANT EVENTS: 10/24-10/31 > Hospital Admission for SOB, pulm edema in sitting of missed dialysis session  10/30 > Left femoral AV loop Graft   LINES/TUBES: Left femoral AV loop Graft 10/30 Right Femoral permcath 10/24 >  ETT 12/7 >   DISCUSSION: 65 year old male presents to ED 12/7 in acute on chronic respiratory failure secondary to pulmonary edema. States the he missed dialysis on Tuesday, currently potassium 7.2 with wide complex EKG changes. In ED required intubated.  Got 3 liters off yesterday. Now weaning. A little hypotensive on HD today but think this is likely a mix of volume status. Weaning all day , wakes and follows commands , sleepy . Mentation biggest barrier to extubation   ASSESSMENT / PLAN:  PULMONARY A: Acute on Chronic Respiratory Failure  H/O COPD, OSA (non-compliant with CPAP)  P:   PSV/SBT-->Wean as tolerated ,  Duonebs q6H PRN albuterol  If able to extubate , use CPAP At bedtime    CARDIOVASCULAR A:  Acute on Chronic Diastolic Heart Failure  Chronically Occluded left Subclavian vein  H/O HTN, Afib (on home Amiodarone/Coumadin), CAD  P:  Cardiac Monitoring  Coumadin on  hold , hep IV per pharm  Cont home amiodarone  Cont home  Aspirin  Hold home BP meds   RENAL A:   Acute on Chronic ESRD  Hyperkalemia - s/p Kayexalate, Temporization in ED  HD dependent T/R/S H/O Right Kidney Mass (Being follow outpatient) P:   Nephrology on board  Dialysis today  Trend BMP  GASTROINTESTINAL A:   NPO P:   PPI TF if not extubatble   HEMATOLOGIC A:   Anemia of CKD  P:  Trend CBC   INFECTIOUS A:   No issues - does have HD cath to right femoral (does not look infected)  P:   Trend Fever and WBC curve    ENDOCRINE A:    Type 2 DM  Hypothyroidism  P:   Continue home synthroid  SSI Q4H Glucose Checks   NEUROLOGIC A:   Drug Induced Encephalopathy  H/O Bipolar disorder, depression P:   Propofol off  Continuing home remeron and lamicatal  PRN fentanyl as needed to achieve RASS  RASS goal: 0/-1    FAMILY  - Updates: no family at bedside 12/7   - Inter-disciplinary family meet or Palliative Care meeting due by:  09/05/2016   Shedric Fredericks NP-C  [facility]  [phone]   08/31/2016

## 2016-09-01 DIAGNOSIS — J9601 Acute respiratory failure with hypoxia: Secondary | ICD-10-CM | POA: Diagnosis not present

## 2016-09-01 DIAGNOSIS — I132 Hypertensive heart and chronic kidney disease with heart failure and with stage 5 chronic kidney disease, or end stage renal disease: Secondary | ICD-10-CM | POA: Diagnosis not present

## 2016-09-01 DIAGNOSIS — E875 Hyperkalemia: Secondary | ICD-10-CM | POA: Diagnosis not present

## 2016-09-01 DIAGNOSIS — R0603 Acute respiratory distress: Secondary | ICD-10-CM | POA: Diagnosis not present

## 2016-09-01 LAB — GLUCOSE, CAPILLARY
GLUCOSE-CAPILLARY: 105 mg/dL — AB (ref 65–99)
GLUCOSE-CAPILLARY: 108 mg/dL — AB (ref 65–99)
GLUCOSE-CAPILLARY: 95 mg/dL (ref 65–99)
GLUCOSE-CAPILLARY: 98 mg/dL (ref 65–99)
Glucose-Capillary: 131 mg/dL — ABNORMAL HIGH (ref 65–99)
Glucose-Capillary: 137 mg/dL — ABNORMAL HIGH (ref 65–99)

## 2016-09-01 LAB — CBC WITH DIFFERENTIAL/PLATELET
BASOS PCT: 0 %
Basophils Absolute: 0 10*3/uL (ref 0.0–0.1)
Eosinophils Absolute: 0.3 10*3/uL (ref 0.0–0.7)
Eosinophils Relative: 2 %
HEMATOCRIT: 34.9 % — AB (ref 39.0–52.0)
HEMOGLOBIN: 11.2 g/dL — AB (ref 13.0–17.0)
LYMPHS ABS: 1 10*3/uL (ref 0.7–4.0)
LYMPHS PCT: 8 %
MCH: 27.9 pg (ref 26.0–34.0)
MCHC: 32.1 g/dL (ref 30.0–36.0)
MCV: 86.8 fL (ref 78.0–100.0)
MONO ABS: 1.2 10*3/uL — AB (ref 0.1–1.0)
MONOS PCT: 10 %
NEUTROS ABS: 10 10*3/uL — AB (ref 1.7–7.7)
NEUTROS PCT: 80 %
Platelets: 210 10*3/uL (ref 150–400)
RBC: 4.02 MIL/uL — ABNORMAL LOW (ref 4.22–5.81)
RDW: 18.7 % — AB (ref 11.5–15.5)
WBC: 12.5 10*3/uL — ABNORMAL HIGH (ref 4.0–10.5)

## 2016-09-01 LAB — RENAL FUNCTION PANEL
ANION GAP: 14 (ref 5–15)
Albumin: 3.9 g/dL (ref 3.5–5.0)
BUN: 29 mg/dL — ABNORMAL HIGH (ref 6–20)
CALCIUM: 9 mg/dL (ref 8.9–10.3)
CHLORIDE: 90 mmol/L — AB (ref 101–111)
CO2: 30 mmol/L (ref 22–32)
Creatinine, Ser: 6.72 mg/dL — ABNORMAL HIGH (ref 0.61–1.24)
GFR calc non Af Amer: 8 mL/min — ABNORMAL LOW (ref >60)
GFR, EST AFRICAN AMERICAN: 9 mL/min — AB (ref >60)
GLUCOSE: 97 mg/dL (ref 65–99)
Phosphorus: 7.7 mg/dL — ABNORMAL HIGH (ref 2.5–4.6)
Potassium: 3.8 mmol/L (ref 3.5–5.1)
SODIUM: 134 mmol/L — AB (ref 135–145)

## 2016-09-01 LAB — BASIC METABOLIC PANEL
ANION GAP: 16 — AB (ref 5–15)
BUN: 29 mg/dL — AB (ref 6–20)
CO2: 27 mmol/L (ref 22–32)
Calcium: 9.1 mg/dL (ref 8.9–10.3)
Chloride: 91 mmol/L — ABNORMAL LOW (ref 101–111)
Creatinine, Ser: 6.77 mg/dL — ABNORMAL HIGH (ref 0.61–1.24)
GFR calc Af Amer: 9 mL/min — ABNORMAL LOW (ref >60)
GFR, EST NON AFRICAN AMERICAN: 8 mL/min — AB (ref >60)
GLUCOSE: 100 mg/dL — AB (ref 65–99)
POTASSIUM: 3.8 mmol/L (ref 3.5–5.1)
Sodium: 134 mmol/L — ABNORMAL LOW (ref 135–145)

## 2016-09-01 LAB — HEPARIN LEVEL (UNFRACTIONATED): HEPARIN UNFRACTIONATED: 0.42 [IU]/mL (ref 0.30–0.70)

## 2016-09-01 LAB — MAGNESIUM: Magnesium: 2.5 mg/dL — ABNORMAL HIGH (ref 1.7–2.4)

## 2016-09-01 LAB — TRIGLYCERIDES: Triglycerides: 96 mg/dL (ref <150)

## 2016-09-01 MED ORDER — LEVOTHYROXINE SODIUM 100 MCG PO TABS
100.0000 ug | ORAL_TABLET | Freq: Every day | ORAL | Status: DC
  Administered 2016-09-02 – 2016-09-04 (×2): 100 ug via ORAL
  Filled 2016-09-01 (×3): qty 1

## 2016-09-01 MED ORDER — INSULIN ASPART 100 UNIT/ML ~~LOC~~ SOLN: 0.0000 [IU] | Freq: Every day | SUBCUTANEOUS | Status: DC

## 2016-09-01 MED ORDER — INSULIN ASPART 100 UNIT/ML ~~LOC~~ SOLN
0.0000 [IU] | Freq: Three times a day (TID) | SUBCUTANEOUS | Status: DC
  Administered 2016-09-02: 1 [IU] via SUBCUTANEOUS

## 2016-09-01 MED ORDER — IPRATROPIUM-ALBUTEROL 0.5-2.5 (3) MG/3ML IN SOLN
3.0000 mL | Freq: Four times a day (QID) | RESPIRATORY_TRACT | Status: DC | PRN
  Administered 2016-09-02: 3 mL via RESPIRATORY_TRACT
  Filled 2016-09-01: qty 3

## 2016-09-01 MED ORDER — VANCOMYCIN HCL IN DEXTROSE 1-5 GM/200ML-% IV SOLN
1000.0000 mg | INTRAVENOUS | Status: DC
  Filled 2016-09-01: qty 200

## 2016-09-01 MED ORDER — PANTOPRAZOLE SODIUM 40 MG PO TBEC: 40.0000 mg | DELAYED_RELEASE_TABLET | Freq: Every day | ORAL | Status: DC

## 2016-09-01 MED ORDER — PHENOL 1.4 % MT LIQD
1.0000 | OROMUCOSAL | Status: DC | PRN
  Filled 2016-09-01: qty 177

## 2016-09-01 MED ORDER — HEPARIN SODIUM (PORCINE) 1000 UNIT/ML DIALYSIS
2500.0000 [IU] | INTRAMUSCULAR | Status: DC | PRN
  Filled 2016-09-01: qty 3

## 2016-09-01 MED ORDER — SUCROFERRIC OXYHYDROXIDE 500 MG PO CHEW
1000.0000 mg | CHEWABLE_TABLET | Freq: Three times a day (TID) | ORAL | Status: DC
  Administered 2016-09-01: 1000 mg via ORAL
  Filled 2016-09-01 (×11): qty 2

## 2016-09-01 NOTE — Progress Notes (Signed)
PULMONARY / CRITICAL CARE MEDICINE   Name: Noah Lewis MRN: [id number] DOB: [birth year]    ADMISSION DATE:  08/29/2016 CONSULTATION DATE:  08/29/2016  REFERRING MD:  Dr. Roxanne Mins   CHIEF COMPLAINT:  Dyspnea, Hyperkalemia   SUBJECTIVE: Patient successfully extubated yesterday. Denies any dyspnea or cough. Denies any chest pain or pressure.  REVIEW OF SYSTEMS:  Denies any subjective fever, chills, or sweats. Denies any abdominal pain or nausea. ate breakfast okay. No headache or vision changes.  VITAL SIGNS: BP 91/64   Pulse 96   Temp 98.4 F (36.9 C) (Oral)   Resp 12   Ht 5\' 6"  (1.676 m)   Wt 174 lb 9.7 oz (79.2 kg)   SpO2 95%   BMI 28.18 kg/m   HEMODYNAMICS:    VENTILATOR SETTINGS: Vent Mode: CPAP FiO2 (%):  [28 %-40 %] 28 % Set Rate:  [14 bmp] 14 bmp Vt Set:  [510 mL] 510 mL PEEP:  [5 cmH20] 5 cmH20 Pressure Support:  [0 cmH20-8 cmH20] 0 cmH20 Plateau Pressure:  [17 cmH20] 17 cmH20  INTAKE / OUTPUT: I/O last 3 completed shifts: In: 533.6 [P.O.:400; I.V.:133.6] Out: 2020 [Urine:20; Other:2000]  PHYSICAL EXAMINATION: Gen.: Sitting up in chair. Empty tray in front of the patient. Watching TV. No family at bedside. Integument: Warm and dry. No rash on exposed skin. Cardiovascular: Regular rate. No edema. No appreciable JVD. Pulmonary: Slightly diminished breath sounds in the bases but good aeration bilaterally. Normal work of breathing on nasal cannula oxygen.  Abdomen: Soft. Nontender. Protuberant.  LABS:  BMET  Recent Labs Lab 08/30/16 0720 08/31/16 0147 09/01/16 0516  NA 136 135 134*  134*  K 4.4 3.9 3.8  3.8  CL 92* 93* 90*  91*  CO2 23 25 30  27   BUN 50* 26* 29*  29*  CREATININE 12.06* 7.86* 6.72*  6.77*  GLUCOSE 113* 99 97  100*    Electrolytes  Recent Labs Lab 08/30/16 0720 08/31/16 0147 09/01/16 0516  CALCIUM 8.6* 8.8* 9.0  9.1  MG 2.0 2.3 2.5*  PHOS 8.2*  8.3* 7.5* 7.7*    CBC  Recent Labs Lab 08/30/16 0720  08/31/16 0147 09/01/16 0516  WBC 10.9* 12.4* 12.5*  HGB 9.6* 10.0* 11.2*  HCT 29.9* 31.5* 34.9*  PLT 151 155 210    Coag's  Recent Labs Lab 08/29/16 0619 08/30/16 0720 08/31/16 0147  INR 2.39 2.31 2.10    Sepsis Markers No results for input(s): LATICACIDVEN, PROCALCITON, O2SATVEN in the last 168 hours.  ABG  Recent Labs Lab 08/29/16 0704 08/29/16 1000  PHART 7.194* 7.203*  PCO2ART 49.2* 36.8  PO2ART 257.0* 91.1    Liver Enzymes  Recent Labs Lab 08/30/16 0720 08/31/16 0147 09/01/16 0516  ALBUMIN 3.9 3.9 3.9    Cardiac Enzymes  Recent Labs Lab 08/29/16 1016 08/29/16 1423 08/29/16 2307  TROPONINI 0.04* 0.04* 0.04*    Glucose  Recent Labs Lab 08/31/16 1640 08/31/16 2024 09/01/16 0017 09/01/16 0341 09/01/16 0900 09/01/16 1200  GLUCAP 70 106* 95 98 108* 131*    Imaging No results found.   STUDIES:  CXR 12/7: Vascular and interstitial congestive changes. Small effusions. Marked Cardiomegaly.   MICROBIOLOGY: MRSA PCR 12/7:  Negative  ANTIBIOTICS: None  SIGNIFICANT EVENTS: 10/24-10/31 [facility] Admission for SOB, pulm edema in sitting of missed dialysis session  10/30 - Left femoral AV loop Graft  12/09 - Extubated  LINES/TUBES: ETT 12/7 - 12/9  Left femoral AV loop Graft 10/30 Right Femoral permcath 10/24 >  PIV  ASSESSMENT / PLAN:  PULMONARY A: Acute on Chronic Hypoxic Respiratory Failure - Improving. Secondary to pulmonary edema. H/O COPD, OSA (non-compliant with CPAP)   P:   Weaning FiO2 Continuing Pulse Oximetry Duoneb q6hr prn Incentive Spirometry for pulmonary toilette  CARDIOVASCULAR A:  Acute on Chronic Diastolic Heart Failure  Chronically Occluded left Subclavian vein  Thrombosed L Thigh AV Graft H/O HTN, Afib (on home Amiodarone/Coumadin), & CAD   P:  Continue Telemetry monitoring Vitals per Unit Protocol IR to do thrombectomy on AV Graft 12/11 Cont home amiodarone & ASA Hold home  Lopressor  RENAL A:   ESRD - Dialysis T, Th, & Sat. Hyperkalemia - Resolved. H/O Right Kidney Mass (Being follow outpatient)  P:   Monitoring electrolytes daily RRT per Nephrology  GASTROINTESTINAL A:   No acute issues.  P:   Renal Diet D/C Protonix  HEMATOLOGIC A:   Anemia - Mild. No active bleeding. Coagulopathy - Secondary to Coumadin.  P:  Trending cell counts daily w/ CBC Holding Coumadin INR daily  Heparin gtt per pharmacy protocol  INFECTIOUS A:   No acute issues.  P:   Monitor for signs/symptoms of infection  ENDOCRINE A:    DM Type 2 Hypothyroidism   P:   Synthroid 171mcg daily Switching Accu-Checks to qAC & HS SSI per Sensitive Algorithm   NEUROLOGIC A:   Drug Induced Encephalopathy - Resolved. H/O Bipolar disorder/depression  P:   Continuing home remeron and lamicatal  D/C Fentanyl & Propofol  FAMILY  - Updates: No family at bedside 12/9.  - Inter-disciplinary family meet or Palliative Care meeting due by:  09/05/2016   TODAY'S SUMMARY:  65 y.o. male presents to ED 12/7 in acute on chronic respiratory failure secondary to pulmonary edema.  patient also admitted with hyperkalemia. Hypoxic respiratory failure continuing to improve postextubation. Hyperkalemia resolved after hemodialysis. Patient tolerating diet at this time. Transitioning insulin regimen to Accu-Cheks every before meals & at bedtime. Given continued clinical stability I will transfer the patient to a telemetry bed. He is planned for thrombectomy of his AV graft by interventional radiology tomorrow and may be able to discharge to home following it. Therefore leaving on PCCM service.  Sonia Baller Ashok Cordia, M.D. [facility] Pulmonary & Critical Care Pager:  [phone] After 3pm or if no response, call [phone] 1:57 PM 09/01/2016

## 2016-09-01 NOTE — Progress Notes (Signed)
ANTICOAGULATION CONSULT NOTE - Follow Up Consult  Pharmacy Consult for Heparin (warfarin on hold) Indication: atrial fibrillation  Allergies  Allergen Reactions  . Penicillins Other (See Comments)    Reaction:  Unknown     Patient Measurements: Height: 5\' 6"  (167.6 cm) Weight: 174 lb 9.7 oz (79.2 kg) IBW/kg (Calculated) : 63.8  Vital Signs: Temp: 98.7 F (37.1 C) (12/10 0343) Temp Source: Oral (12/10 0343) BP: 102/66 (12/09 2300) Pulse Rate: 107 (12/09 2300)  Labs:  Recent Labs  08/29/16 0619 08/29/16 1016 08/29/16 1423  08/29/16 2307 08/30/16 0720 08/31/16 0147 09/01/16 0516  HGB  --   --   --   --   --  9.6* 10.0* 11.2*  HCT  --   --   --   --   --  29.9* 31.5* 34.9*  PLT  --   --   --   --   --  151 155 210  LABPROT 26.5*  --   --   --   --  25.8* 23.9*  --   INR 2.39  --   --   --   --  2.31 2.10  --   HEPARINUNFRC  --   --   --   --   --   --   --  <0.10*  CREATININE  --  19.67*  --   < >  --  12.06* 7.86* 6.77*  TROPONINI  --  0.04* 0.04*  --  0.04*  --   --   --   < > = values in this interval not displayed.  Estimated Creatinine Clearance: 10.8 mL/min (by C-G formula based on SCr of 6.77 mg/dL (H)).   Assessment: On heparin while warfarin on hold in anticipation of IR procedure, heparin level is undetectable this AM, no issues per RN.   Goal of Therapy:  Heparin level 0.3-0.7 units/ml Monitor platelets by anticoagulation protocol: Yes   Plan:  -Inc heparin to 1500 units/hr -1400 HL  Alysson Geist 09/01/2016,6:05 AM

## 2016-09-01 NOTE — Progress Notes (Signed)
[place] for warfarin >> heparin  Indication: atrial fibrillation  Allergies  Allergen Reactions  . Penicillins Other (See Comments)    Reaction:  Unknown     Patient Measurements: Height: 5\' 6"  (167.6 cm) Weight: 174 lb 9.7 oz (79.2 kg) IBW/kg (Calculated) : 63.8   Vital Signs: Temp: 98.4 F (36.9 C) (12/10 1159) Temp Source: Oral (12/10 1159) BP: 109/81 (12/10 1500) Pulse Rate: 92 (12/10 1500)  Labs:  Recent Labs  08/29/16 2307  08/30/16 0720 08/31/16 0147 09/01/16 0516 09/01/16 1510  HGB  --   < > 9.6* 10.0* 11.2*  --   HCT  --   --  29.9* 31.5* 34.9*  --   PLT  --   --  151 155 210  --   LABPROT  --   --  25.8* 23.9*  --   --   INR  --   --  2.31 2.10  --   --   HEPARINUNFRC  --   --   --   --  <0.10* 0.42  CREATININE  --   --  12.06* 7.86* 6.72*  6.77*  --   TROPONINI 0.04*  --   --   --   --   --   < > = values in this interval not displayed.  Estimated Creatinine Clearance: 10.9 mL/min (by C-G formula based on SCr of 6.72 mg/dL (H)).   Medical History: Past Medical History:  Diagnosis Date  . Arthritis   . Asthma   . Cancer ([place])   . COPD (chronic obstructive pulmonary disease) ([place])   . Coronary artery disease   . Diabetes mellitus without complication ([place])   . Hypertension   . Renal disorder     Assessment: 69 yoF with PMHx of atrial fibrillation on warfarin PTA admitted with respiratory failure. Warfarin currently on hold due to need for thrombectomy of left thigh AV graft that is tentatively scheduled for 12/11.  Heparin level is therapeutic after most recent dose adjustment to 1500 units/hr.   Goal of Therapy:  Heparin level 0.3-0.7 units/ml Monitor platelets by anticoagulation protocol: Yes   Plan:  1. Continue heparin infusion at 1500 units/hr 2. Heparin level in am to serve as confirmatory  3. Follow up anticoagulation plans post thrombectomy  Vincenza Hews, PharmD, BCPS 09/01/2016, 4:14  PM Pager: [phone]

## 2016-09-01 NOTE — Progress Notes (Signed)
Report called to Rodman Key RN for [phone] and patient transferred via wheelchair with monitor and oxygen

## 2016-09-01 NOTE — Progress Notes (Signed)
Patient ID: Noah Lewis, male   DOB: [birth year], 65 y.o.   MRN: [id number]  [facility] Progress Note   Assessment/ Plan:   1. Volume overload/hyperkalemia/acute encephalopathy: Secondary to missed dialysis with uremia and volume excess-improved with hemodialysis since admission and now extubated and apparently did well overnight. 2. ESRD: previously with missed dialysis treatments, status post daily hemodialysis over the last 3 days. Plan to continue hemodialysis with his next treatment again on Tuesday. No acute indications for dialysis today. 3. Anemia: Low hemoglobin-restarted back on Aranesp yesterday, no additional overt loss noted. 4. CKD-MBD: Restart binders now that he is eating and drinking status post extubation. Continue Hectorol. 5. Thrombosed left thigh AV graft: Appreciate assistance from interventional radiology with evaluating the patient and schedule him for thrombectomy of left thigh graft tomorrow. 6. Hypertension: Intermittently low blood pressures requiring IV albumin. Continue to monitor with ultrafiltration  Subjective:   Denies any complaints and expresses understanding about the importance of compliance with hemodialysis treatments. No acute events noted overnight.    Objective:   BP 105/64 (BP Location: Right Arm)   Pulse 95   Temp 98.7 F (37.1 C) (Oral)   Resp 10   Ht 5\' 6"  (1.676 m)   Wt 79.2 kg (174 lb 9.7 oz)   SpO2 95%   BMI 28.18 kg/m   Physical Exam: Gen: Sitting up in recliner eating breakfast CVS: Pulse regular tachycardia, S1 and S2 with ejection systolic murmur Resp: Anteriorly clear to auscultation, no distinct rales or rhonchi Abd: Soft, obese, nontender Ext: No lower extremity edema. Right femoral tunneled hemodialysis catheter with left thigh graft thrombosed  Labs: BMET  Recent Labs Lab 08/29/16 0618 08/29/16 1016 08/29/16 1610 08/29/16 2136 08/30/16 0720 08/31/16 0147 09/01/16 0516  NA 138 138 140 138 136  135 134*  134*  K 7.2* 5.4* 5.5* 4.8 4.4 3.9 3.8  3.8  CL 96* 94* 101 93* 92* 93* 90*  91*  CO2 11* 15* 16* 23 23 25 30  27   GLUCOSE 92 96 82 55* 113* 99 97  100*  BUN 108* 112* 48* 47* 50* 26* 29*  29*  CREATININE 18.89* 19.67* 10.57* 11.01* 12.06* 7.86* 6.72*  6.77*  CALCIUM 8.4* 8.5* 9.4 8.9 8.6* 8.8* 9.0  9.1  PHOS  --   --   --   --  8.2*  8.3* 7.5* 7.7*   CBC  Recent Labs Lab 08/29/16 0618 08/30/16 0720 08/31/16 0147 09/01/16 0516  WBC 11.1* 10.9* 12.4* 12.5*  NEUTROABS 9.8*  --  10.2* 10.0*  HGB 11.2* 9.6* 10.0* 11.2*  HCT 35.5* 29.9* 31.5* 34.9*  MCV 87.9 85.4 87.0 86.8  PLT 242 151 155 210   Medications:    . amiodarone  400 mg Oral Daily  . aspirin  81 mg Oral Daily  . darbepoetin (ARANESP) injection - DIALYSIS  40 mcg Intravenous Q Thu-HD  . doxercalciferol  2 mcg Intravenous Q T,Th,Sa-HD  . insulin aspart  0-9 Units Subcutaneous Q4H  . ipratropium-albuterol  3 mL Nebulization Q6H  . lamoTRIgine  25 mg Oral BID  . levothyroxine  50 mcg Intravenous QAC breakfast  . mouth rinse  15 mL Mouth Rinse BID  . mirtazapine  15 mg Oral QHS  . pantoprazole (PROTONIX) IV  40 mg Intravenous Q24H   Elmarie Shiley, MD 09/01/2016, 8:22 AM

## 2016-09-02 DIAGNOSIS — R0603 Acute respiratory distress: Secondary | ICD-10-CM | POA: Diagnosis not present

## 2016-09-02 DIAGNOSIS — J9601 Acute respiratory failure with hypoxia: Secondary | ICD-10-CM | POA: Diagnosis not present

## 2016-09-02 DIAGNOSIS — D689 Coagulation defect, unspecified: Secondary | ICD-10-CM

## 2016-09-02 DIAGNOSIS — J441 Chronic obstructive pulmonary disease with (acute) exacerbation: Secondary | ICD-10-CM | POA: Diagnosis not present

## 2016-09-02 DIAGNOSIS — I132 Hypertensive heart and chronic kidney disease with heart failure and with stage 5 chronic kidney disease, or end stage renal disease: Secondary | ICD-10-CM | POA: Diagnosis not present

## 2016-09-02 DIAGNOSIS — J810 Acute pulmonary edema: Secondary | ICD-10-CM | POA: Diagnosis not present

## 2016-09-02 LAB — RENAL FUNCTION PANEL
Albumin: 3.6 g/dL (ref 3.5–5.0)
Anion gap: 22 — ABNORMAL HIGH (ref 5–15)
BUN: 54 mg/dL — ABNORMAL HIGH (ref 6–20)
CALCIUM: 8.7 mg/dL — AB (ref 8.9–10.3)
CO2: 21 mmol/L — ABNORMAL LOW (ref 22–32)
CREATININE: 9.33 mg/dL — AB (ref 0.61–1.24)
Chloride: 91 mmol/L — ABNORMAL LOW (ref 101–111)
GFR, EST AFRICAN AMERICAN: 6 mL/min — AB (ref >60)
GFR, EST NON AFRICAN AMERICAN: 5 mL/min — AB (ref >60)
Glucose, Bld: 101 mg/dL — ABNORMAL HIGH (ref 65–99)
PHOSPHORUS: 10.4 mg/dL — AB (ref 2.5–4.6)
Potassium: 4.2 mmol/L (ref 3.5–5.1)
SODIUM: 134 mmol/L — AB (ref 135–145)

## 2016-09-02 LAB — CBC WITH DIFFERENTIAL/PLATELET
BASOS PCT: 0 %
Basophils Absolute: 0 10*3/uL (ref 0.0–0.1)
EOS PCT: 4 %
Eosinophils Absolute: 0.4 10*3/uL (ref 0.0–0.7)
HCT: 32.4 % — ABNORMAL LOW (ref 39.0–52.0)
HEMOGLOBIN: 10.3 g/dL — AB (ref 13.0–17.0)
LYMPHS PCT: 14 %
Lymphs Abs: 1.5 10*3/uL (ref 0.7–4.0)
MCH: 27.5 pg (ref 26.0–34.0)
MCHC: 31.8 g/dL (ref 30.0–36.0)
MCV: 86.4 fL (ref 78.0–100.0)
MONO ABS: 0.8 10*3/uL (ref 0.1–1.0)
Monocytes Relative: 7 %
NEUTROS ABS: 8.1 10*3/uL — AB (ref 1.7–7.7)
NEUTROS PCT: 75 %
Platelets: 205 10*3/uL (ref 150–400)
RBC: 3.75 MIL/uL — ABNORMAL LOW (ref 4.22–5.81)
RDW: 18.5 % — ABNORMAL HIGH (ref 11.5–15.5)
WBC: 10.8 10*3/uL — ABNORMAL HIGH (ref 4.0–10.5)

## 2016-09-02 LAB — HEPARIN LEVEL (UNFRACTIONATED)
HEPARIN UNFRACTIONATED: 0.38 [IU]/mL (ref 0.30–0.70)
Heparin Unfractionated: 0.13 IU/mL — ABNORMAL LOW (ref 0.30–0.70)

## 2016-09-02 LAB — MAGNESIUM: Magnesium: 2.5 mg/dL — ABNORMAL HIGH (ref 1.7–2.4)

## 2016-09-02 LAB — PROTIME-INR
INR: 2.05
Prothrombin Time: 23.5 seconds — ABNORMAL HIGH (ref 11.4–15.2)

## 2016-09-02 LAB — GLUCOSE, CAPILLARY
GLUCOSE-CAPILLARY: 113 mg/dL — AB (ref 65–99)
GLUCOSE-CAPILLARY: 138 mg/dL — AB (ref 65–99)
Glucose-Capillary: 125 mg/dL — ABNORMAL HIGH (ref 65–99)

## 2016-09-02 MED ORDER — PHYTONADIONE 5 MG PO TABS
2.5000 mg | ORAL_TABLET | ORAL | Status: AC
  Administered 2016-09-02: 2.5 mg via ORAL
  Filled 2016-09-02: qty 1

## 2016-09-02 MED ORDER — VANCOMYCIN HCL IN DEXTROSE 1-5 GM/200ML-% IV SOLN
1000.0000 mg | INTRAVENOUS | Status: AC
  Administered 2016-09-04: 1000 mg via INTRAVENOUS
  Filled 2016-09-02: qty 200

## 2016-09-02 NOTE — Progress Notes (Signed)
PULMONARY / CRITICAL CARE MEDICINE   Name: Noah Lewis MRN: [id number] DOB: [birth year]    ADMISSION DATE:  08/29/2016 CONSULTATION DATE:  08/29/2016  REFERRING MD:  Dr. Roxanne Lewis   CHIEF COMPLAINT:  Dyspnea, Hyperkalemia   SUBJECTIVE: No events overnight  VITAL SIGNS: BP 101/61 (BP Location: Right Leg)   Pulse 88   Temp 97.6 F (36.4 C) (Oral)   Resp 20   Ht 5\' 7"  (1.702 m)   Wt 178 lb 1.6 oz (80.8 kg)   SpO2 100%   BMI 27.89 kg/m   INTAKE / OUTPUT: I/O last 3 completed shifts: In: 1228.6 [P.O.:750; I.V.:478.6] Out: -   PHYSICAL EXAMINATION: Gen.:awake, no distress. Denies any pain Integument: Warm and dry. No rash on exposed skin. Cardiovascular: Regular rate. No edema. No appreciable JVD. Pulmonary: Normal work of breathing on nasal cannula oxygen. Some rhonchi that improve w/ cough  Abdomen: Soft. Nontender. Protuberant.  LABS:  BMET  Recent Labs Lab 08/31/16 0147 09/01/16 0516 09/02/16 0529  NA 135 134*  134* 134*  K 3.9 3.8  3.8 4.2  CL 93* 90*  91* 91*  CO2 25 30  27  21*  BUN 26* 29*  29* 54*  CREATININE 7.86* 6.72*  6.77* 9.33*  GLUCOSE 99 97  100* 101*    Electrolytes  Recent Labs Lab 08/31/16 0147 09/01/16 0516 09/02/16 0529  CALCIUM 8.8* 9.0  9.1 8.7*  MG 2.3 2.5* 2.5*  PHOS 7.5* 7.7* 10.4*    CBC  Recent Labs Lab 08/31/16 0147 09/01/16 0516 09/02/16 0529  WBC 12.4* 12.5* 10.8*  HGB 10.0* 11.2* 10.3*  HCT 31.5* 34.9* 32.4*  PLT 155 210 205    Coag's  Recent Labs Lab 08/30/16 0720 08/31/16 0147 09/02/16 0529  INR 2.31 2.10 2.05    Sepsis Markers No results for input(s): LATICACIDVEN, PROCALCITON, O2SATVEN in the last 168 hours.  ABG  Recent Labs Lab 08/29/16 0704 08/29/16 1000  PHART 7.194* 7.203*  PCO2ART 49.2* 36.8  PO2ART 257.0* 91.1    Liver Enzymes  Recent Labs Lab 08/31/16 0147 09/01/16 0516 09/02/16 0529  ALBUMIN 3.9 3.9 3.6    Cardiac Enzymes  Recent Labs Lab 08/29/16 1016  08/29/16 1423 08/29/16 2307  TROPONINI 0.04* 0.04* 0.04*    Glucose  Recent Labs Lab 09/01/16 0341 09/01/16 0900 09/01/16 1200 09/01/16 1839 09/01/16 2221 09/02/16 0800  GLUCAP 98 108* 131* 105* 137* 113*    Imaging No results found.   STUDIES:  CXR 12/7: Vascular and interstitial congestive changes. Small effusions. Marked Cardiomegaly.   MICROBIOLOGY: MRSA PCR 12/7:  Negative  ANTIBIOTICS: None  SIGNIFICANT EVENTS: 10/24-10/31 [facility] Admission for SOB, pulm edema in sitting of missed dialysis session  10/30 - Left femoral AV loop Graft  12/09 - Extubated  LINES/TUBES: ETT 12/7 - 12/9  Left femoral AV loop Graft 10/30 Right Femoral permcath 10/24 >  PIV  ASSESSMENT / PLAN:   Acute on Chronic Hypoxic Respiratory Failure - Improving. Secondary to pulmonary edema. H/O COPD, OSA (non-compliant with CPAP)  Plan:   Weaning FiO2 Continuing Pulse Oximetry Duoneb q6hr prn Incentive Spirometry for pulmonary toilette Mobilize    Acute on Chronic Diastolic Heart Failure  Chronically Occluded left Subclavian vein  H/O HTN, Afib (on home Amiodarone/Coumadin), & CAD  Plan:  Continue Telemetry monitoring  Cont home amiodarone & ASA Hold home Lopressor  Thrombosed L Thigh AV Graft IR to do thrombectomy on AV Graft 12/12 assuming INR w/in range  ESRD - Dialysis T, Th, &  Sat. Hyperkalemia - Resolved. H/O Right Kidney Mass (Being follow outpatient) Plan:   Monitoring electrolytes daily RRT per Nephrology   Anemia - Mild. No active bleeding. Coagulopathy - Secondary to Coumadin. Plan:  Trending cell counts daily w/ CBC Holding Coumadin INR daily  Heparin gtt per pharmacy protocol   DM Type 2 Hypothyroidism  Plan:   Synthroid 192mcg daily Switching Accu-Checks to qAC & HS SSI per Sensitive Algorithm    Drug Induced Encephalopathy - Resolved. H/O Bipolar disorder/depression Plan:   Continuing home remeron and lamicatal  D/C Fentanyl &  Propofol   TODAY'S SUMMARY:   65 y.o. male presents to ED 12/7 in acute on chronic respiratory failure secondary to pulmonary edema.  patient also admitted with hyperkalemia. Hypoxic respiratory failure continuing to improve postextubation. Hyperkalemia resolved after hemodialysis. Patient tolerating diet at this time. INR still out of range for thrombectomy so will let him eat and change to NPO after mid-night. Tomorrow likely will be a busy day as will have HD and thrombectomy. Will ask triad to assume care.    [name] ACNP-BC [place] Pager # [phone] OR # [phone] if no answer  8:40 AM 09/02/2016   Attending Note:  65 year old male with chronic respiratory failure due to pulmonary edema and hypoxemia.  On exam, bibasilar crackles.  I reviewed CXR myself, improvement in pulmonary edema noted.  Discussed with PCCM-NP.  Respiratory failure:  - Titrate O2 for sat of 88-92%.  Pulmonary edema:  - Diureses as order.  - Treat CHF.  COPD:  - Duonebs  - PRN albuterol.  Encephalopathy:  - Hold narcotics  - Hold benzos  - Monitor  Clotted fistula  - To declot in AM  - Hold coumadin  - Vitamin K  Discussed with TRH-MD, transfer care to [facility] with PCCM off 12/12.  Patient seen and examined, agree with above note.  I dictated the care and orders written for this patient under my direction.  Rush Farmer, MD [phone]

## 2016-09-02 NOTE — Progress Notes (Signed)
Patient refused this afternoon Insulin saying that he is not diabetic and also he refused blood draw. MD notified. Will continue to monitor.

## 2016-09-02 NOTE — Progress Notes (Signed)
ANTICOAGULATION CONSULT NOTE - Follow Up Consult  Pharmacy Consult for Heparin Indication: atrial fibrillation  Allergies  Allergen Reactions  . Penicillins Other (See Comments)    Reaction:  Unknown     Patient Measurements: Height: 5\' 7"  (170.2 cm) Weight: 178 lb 1.6 oz (80.8 kg) IBW/kg (Calculated) : 66.1   Labs:  Recent Labs  08/31/16 0147  09/01/16 0516 09/01/16 1510 09/02/16 0529 09/02/16 1849  HGB 10.0*  --  11.2*  --  10.3*  --   HCT 31.5*  --  34.9*  --  32.4*  --   PLT 155  --  210  --  205  --   LABPROT 23.9*  --   --   --  23.5*  --   INR 2.10  --   --   --  2.05  --   HEPARINUNFRC  --   < > <0.10* 0.42 0.13* 0.38  CREATININE 7.86*  --  6.72*  6.77*  --  9.33*  --   < > = values in this interval not displayed.  Estimated Creatinine Clearance: 8 mL/min (by C-G formula based on SCr of 9.33 mg/dL (H)).   Medications:  Heparin @ 1700 units/hr  Assessment: 65yom continues on heparin for afib while coumadin on hold pending thrombectomy of left thigh AV graft. Heparin level is now therapeutic at 0.38 after rate increase earlier today.  Goal of Therapy:  Heparin level 0.3-0.7 units/ml Monitor platelets by anticoagulation protocol: Yes   Plan:  1) Continue heparin at 1700 units/hr 2) Daily heparin level and CBC  Deboraha Sprang 09/02/2016,7:51 PM

## 2016-09-02 NOTE — Care Management Important Message (Signed)
Important Message  Patient Details  Name: Noah Lewis MRN: [id number] Date of Birth: [birth year]   Medicare Important Message Given:  Yes    [name] 09/02/2016, 10:06 AM

## 2016-09-02 NOTE — Progress Notes (Signed)
Nutrition Follow-up  DOCUMENTATION CODES:   Not applicable  INTERVENTION:   -Reviewed renal diet guidelines with pt -Continue renal diet  NUTRITION DIAGNOSIS:   Inadequate oral intake related to inability to eat as evidenced by NPO status.  Resolved, no nutrition dx at this time  GOAL:   Patient will meet greater than or equal to 90% of their needs  Met  MONITOR:   PO intake, Labs, Weight trends, Skin, I & O's  REASON FOR ASSESSMENT:   Ventilator    ASSESSMENT:   Noah Lewis is an 65 y.o. male with PMhx significant for DM, HTN, COPD and ESRD- HD TTS at [facility], non compliance with HD.  Pt was noted to miss HD last Thursday, went on Saturday but left out 2.6 kg over, then proceeded to miss Tuesday and came in early this AM with MS change and abnormal EKG.  Had to be intubated- CXR with bilat infiltrates and cardiomegaly, K of 7.2.  Acidotic with pH 7.19  Pt extubated on 08/31/16. Pt transferred from ICU to medical floor on 09/01/16.   Per MD notes, pt awaiting declotting of HD graft pending INR level.   Spoke with pt and family at bedside. Pt has been consuming 75-100% of meals. Pt typically has a good appetite, however, adhering to the renal diet can be a struggle for him. Pt requesting bacon and ramen noodles at time of visit. Educated pt on purpose of renal diet restrictions as well as renal friendly substitutions for favorite foods. Pt wife is very supportive.   Labs reviewed: Na: 134, Phos: 10.4, Mg: 2.5, CNHS: 105-137.   Diet Order:  Diet NPO time specified Except for: Sips with Meds Diet renal with fluid restriction Fluid restriction: 1200 mL Fluid; Room service appropriate? Yes; Fluid consistency: Thin  Skin:  Reviewed, no issues  Last BM:  08/30/16  Height:   Ht Readings from Last 1 Encounters:  09/01/16 _0  (1.702 m)    Weight:   Wt Readings from Last 1 Encounters:  09/02/16 178 lb 1.6 oz (80.8 kg)    Ideal Body Weight:  64.5 kg  BMI:  Body  mass index is 27.89 kg/m.  Estimated Nutritional Needs:   Kcal:  1900-2100  Protein:  100-115 grams  Fluid:  per MD  EDUCATION NEEDS:   Education needs addressed  Siana Panameno A. Jimmye Norman, RD, LDN, CDE Pager: [phone] After hours Pager: [phone]

## 2016-09-02 NOTE — Progress Notes (Signed)
Patient ID: Noah Lewis, male   DOB: 1951-04-14, 65 y.o.   MRN: [id number]   INR 2.05 today  Pt tentatively scheduled for left thigh dialysis graft declot today INR must be wnl to safely proceed per Dr Randal Buba for now Pt has an existing functioning Rt femoral temp cath  Will recheck inr in  am

## 2016-09-02 NOTE — Care Management Note (Signed)
Case Management Note  Patient Details  Name: Noah Lewis MRN: [id number] Date of Birth: October 14, 1950  Subjective/Objective:                Patient admitted from [facility] for hyperkalemia and SOB after missing HD. Scheduled T,T,S. Will have HD graft declotted (hopefully) tomorrow pending INR level.     Action/Plan:  Anticipate return to SNF when clear for DC. Expected Discharge Date:                  Expected Discharge Plan:  [place] (From [facility])  In-House Referral:  Clinical Social Work  Discharge planning Services  CM Consult  Post Acute Care Choice:    Choice offered to:     DME Arranged:    DME Agency:     HH Arranged:    [place] Agency:     Status of Service:  Completed, signed off  If discussed at [employer], dates discussed:    Additional Comments:  Carles Collet, RN 09/02/2016, 10:18 AM

## 2016-09-02 NOTE — Progress Notes (Signed)
[facility] Progress Note   Subjective: no c/o, no further SOB, INR 2.0 today  Vitals:   09/01/16 1843 09/01/16 2049 09/02/16 0500 09/02/16 0601  BP:  106/63  101/61  Pulse:  (!) 101  88  Resp:  20  20  Temp:  97.7 F (36.5 C)  97.6 F (36.4 C)  TempSrc:  Oral  Oral  SpO2:  98%  100%  Weight: 79.9 kg (176 lb 1.6 oz)  80.8 kg (178 lb 1.6 oz)   Height: 5\' 7"  (1.702 m)       Inpatient medications: . amiodarone  400 mg Oral Daily  . aspirin  81 mg Oral Daily  . darbepoetin (ARANESP) injection - DIALYSIS  40 mcg Intravenous Q Thu-HD  . doxercalciferol  2 mcg Intravenous Q T,Th,Sa-HD  . insulin aspart  0-5 Units Subcutaneous QHS  . insulin aspart  0-9 Units Subcutaneous TID WC  . lamoTRIgine  25 mg Oral BID  . levothyroxine  100 mcg Oral QAC breakfast  . mouth rinse  15 mL Mouth Rinse BID  . mirtazapine  15 mg Oral QHS  . sucroferric oxyhydroxide  1,000 mg Oral TID WC  . [START ON 09/03/2016] vancomycin  1,000 mg Intravenous to XRAY   . heparin 1,700 Units/hr (09/02/16 0852)   sodium chloride, ipratropium-albuterol, phenol  Exam: Gen: Sitting up in recliner eating breakfast CVS: Pulse regular tachycardia, S1 and S2 with ejection systolic murmur Resp: Anteriorly clear to auscultation, no distinct rales or rhonchi Abd: Soft, obese, nontender Ext: No lower extremity edema. Right femoral tunneled hemodialysis catheter with left thigh graft thrombosed   12/7 CXR pulm edema   Dialysis: [facility]  83kg  4h   2/2 bath  Hep yes   R fem HD cath/ L thigh AVG placed 10/30 Hect 2 M-40 last 11/28 [place] Labs: Hb 10.2, Ca 8.5 P 6  pth 537     Assessment: 1  Pulm edema/ acute resp failure - resolved, extubated, on the floor now 2  Clotted L thigh AVG - placed 10/30, declot per IR once INR ok 3  ESRD HD tts - HD tomorrow; AVG should be ready to use 4  Vol is under dry wt 2kg, clear lungs 5  Anemia cont esa here 6  Sec HPTH cont meds 7  Afib - coumadin on hold  for procedure, on IV hep , INR up, may need vit K 8  COPD  Plan - HD tomorrow, will d/w primary about giving some vit K for declot   Kelly Splinter MD [facility] pager [phone]   09/02/2016, 1:18 PM    Recent Labs Lab 08/31/16 0147 09/01/16 0516 09/02/16 0529  NA 135 134*  134* 134*  K 3.9 3.8  3.8 4.2  CL 93* 90*  91* 91*  CO2 25 30  27  21*  GLUCOSE 99 97  100* 101*  BUN 26* 29*  29* 54*  CREATININE 7.86* 6.72*  6.77* 9.33*  CALCIUM 8.8* 9.0  9.1 8.7*  PHOS 7.5* 7.7* 10.4*    Recent Labs Lab 08/31/16 0147 09/01/16 0516 09/02/16 0529  ALBUMIN 3.9 3.9 3.6    Recent Labs Lab 08/31/16 0147 09/01/16 0516 09/02/16 0529  WBC 12.4* 12.5* 10.8*  NEUTROABS 10.2* 10.0* 8.1*  HGB 10.0* 11.2* 10.3*  HCT 31.5* 34.9* 32.4*  MCV 87.0 86.8 86.4  PLT 155 210 205   Iron/TIBC/Ferritin/ %Sat    Component Value Date/Time   IRON 29 (L) 05/30/2015 [id number]  TIBC 290 05/30/2015 0936   FERRITIN 406 (H) 05/30/2015 0936   IRONPCTSAT 10 (L) 05/30/2015 [id number]

## 2016-09-02 NOTE — NC FL2 (Signed)
[place] MEDICAID FL2 LEVEL OF CARE SCREENING TOOL     IDENTIFICATION  Patient Name: Noah Lewis Birthdate: August 04, 1951 Sex: male Admission Date (Current Location): 08/29/2016  [facility] and [place] Number:  Herbalist and Address:  [facility] [place], [place] [address], [place], [place] [address]      Provider Number: [id number]  Attending Physician Name and Address:  Collene Gobble, MD  Relative Name and Phone Number:       Current Level of Care: Hospital Recommended Level of Care: [place] Prior Approval Number:    Date Approved/Denied:   PASRR Number:    Discharge Plan: SNF    Current Diagnoses: Patient Active Problem List   Diagnosis Date Noted  . Hyperkalemia 08/29/2016  . Chronic diastolic heart failure ([place]) 07/24/2016  . Acute kidney injury superimposed on chronic kidney disease ([place]) 07/16/2016  . ESRD (end stage renal disease) ([place]) 06/13/2016  . 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 Pulmonary edema 05/27/2016  . Back pain   . Bacteremia   . Chronic a-fib ([place]) 12/04/2015  . Hypertensive urgency 09/20/2015  . 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 Anemia of chronic kidney failure   . Left arm swelling   . End-stage renal disease needing dialysis ([place]) 05/29/2015  . 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 COPD, moderate ([place]) 05/24/2015  . OSA (obstructive sleep apnea) 05/24/2015  . Type II diabetes mellitus with end-stage renal disease ([place]) 05/24/2015  . ESRD on hemodialysis ([place]) 05/13/2015  . Volume overload 04/28/2015  . Bipolar depression ([place]) 04/28/2015  . Benign hypertension with end-stage renal disease ([place]) 04/28/2015  . Chronic back pain 04/28/2015  . Facial swelling 04/28/2015  . Left upper extremity swelling     Orientation RESPIRATION BLADDER Height & Weight     Self, Time, Situation, Place  Normal  Continent Weight: 178 lb 1.6 oz (80.8 kg) Height:  5\' 7"  (170.2 cm)  BEHAVIORAL SYMPTOMS/MOOD NEUROLOGICAL BOWEL NUTRITION STATUS      Incontinent (rectal tube) Diet (renal)  AMBULATORY STATUS COMMUNICATION OF NEEDS Skin   Total Care Verbally                         Personal Care Assistance Level of Assistance  Bathing, Dressing Bathing Assistance: Maximum assistance   Dressing Assistance: Maximum assistance     Functional Limitations Info             SPECIAL CARE FACTORS FREQUENCY  PT (By licensed PT), OT (By licensed OT)                    Contractures      Additional Factors Info  Code Status, Allergies, Insulin Sliding Scale Code Status Info: FULL Allergies Info: Penicillins   Insulin Sliding Scale Info: 4/day       Current Medications (09/02/2016):  This is the current hospital active medication list Current Facility-Administered Medications  Medication Dose Route Frequency Provider Last Rate Last Dose  . 0.9 %  sodium chloride infusion  250 mL Intravenous PRN Omar Person, NP      . 0.9 %  sodium chloride infusion  100 mL Intravenous PRN Roney Jaffe, MD      . 0.9 %  sodium chloride infusion  100 mL Intravenous PRN Roney Jaffe, MD      . alteplase (CATHFLO ACTIVASE) injection 2 mg  2 mg Intracatheter Once PRN Roney Jaffe, MD      . amiodarone (PACERONE) tablet 400 mg  400 mg Oral Daily Omar Person, NP   400 mg at 09/01/16 0953  . aspirin chewable tablet 81 mg  81 mg Oral Daily Omar Person, NP   81 mg at 09/01/16 0953  . Darbepoetin Alfa (ARANESP) injection 40 mcg  40 mcg Intravenous Q Thu-HD Jamal Maes, MD   40 mcg at 08/29/16 1417  . doxercalciferol (HECTOROL) injection 2 mcg  2 mcg Intravenous Q T,Th,Sa-HD Jamal Maes, MD   2 mcg at 08/31/16 1200  . heparin ADULT infusion 100 units/mL (25000 units/273mL sodium chloride 0.45%)  1,700 Units/hr Intravenous Continuous Donalynn Furlong Watkins, RPH 15 mL/hr at 09/02/16 0641  1,500 Units/hr at 09/02/16 0641  . heparin injection 1,000 Units  1,000 Units Dialysis PRN Roney Jaffe, MD      . heparin injection 2,500 Units  2,500 Units Dialysis PRN Elmarie Shiley, MD      . insulin aspart (novoLOG) injection 0-5 Units  0-5 Units Subcutaneous QHS Javier Glazier, MD      . insulin aspart (novoLOG) injection 0-9 Units  0-9 Units Subcutaneous TID WC Javier Glazier, MD      . ipratropium-albuterol (DUONEB) 0.5-2.5 (3) MG/3ML nebulizer solution 3 mL  3 mL Nebulization Q6H PRN Javier Glazier, MD      . lamoTRIgine (LAMICTAL) tablet 25 mg  25 mg Oral BID Omar Person, NP   25 mg at 09/01/16 2200  . levothyroxine (SYNTHROID, LEVOTHROID) tablet 100 mcg  100 mcg Oral QAC breakfast Collene Gobble, MD      . lidocaine (PF) (XYLOCAINE) 1 % injection 5 mL  5 mL Intradermal PRN Roney Jaffe, MD      . lidocaine-prilocaine (EMLA) cream 1 application  1 application Topical PRN Roney Jaffe, MD      . MEDLINE mouth rinse  15 mL Mouth Rinse BID Thayer Headings, MD   15 mL at 09/01/16 2200  . mirtazapine (REMERON SOL-TAB) disintegrating tablet 15 mg  15 mg Oral QHS Omar Person, NP   15 mg at 09/01/16 2200  . pentafluoroprop-tetrafluoroeth (GEBAUERS) aerosol 1 application  1 application Topical PRN Roney Jaffe, MD      . phenol (CHLORASEPTIC) mouth spray 1 spray  1 spray Mouth/Throat PRN Javier Glazier, MD      . sucroferric oxyhydroxide [facility]) chewable tablet 1,000 mg  1,000 mg Oral TID WC Elmarie Shiley, MD   1,000 mg at 09/01/16 1840  . vancomycin (VANCOCIN) IVPB 1000 mg/200 mL premix  1,000 mg Intravenous to XRAY Monia Sabal, PA-C         Discharge Medications: Please see discharge summary for a list of discharge medications.  Relevant Imaging Results:  Relevant Lab Results:   Additional Information SS#: [id number]  Jorge Ny, LCSW

## 2016-09-02 NOTE — Progress Notes (Addendum)
[place] for heparin  Indication: atrial fibrillation  Allergies  Allergen Reactions  . Penicillins Other (See Comments)    Reaction:  Unknown     Patient Measurements: Height: 5\' 7"  (170.2 cm) Weight: 178 lb 1.6 oz (80.8 kg) IBW/kg (Calculated) : 66.1    Vital Signs: Temp: 97.6 F (36.4 C) (12/11 0601) Temp Source: Oral (12/11 0601) BP: 101/61 (12/11 0601) Pulse Rate: 88 (12/11 0601)  Labs:  Recent Labs  08/31/16 0147 09/01/16 0516 09/01/16 1510 09/02/16 0529  HGB 10.0* 11.2*  --  10.3*  HCT 31.5* 34.9*  --  32.4*  PLT 155 210  --  205  LABPROT 23.9*  --   --  23.5*  INR 2.10  --   --  2.05  HEPARINUNFRC  --  <0.10* 0.42 0.13*  CREATININE 7.86* 6.72*  6.77*  --  9.33*    Estimated Creatinine Clearance: 8 mL/min (by C-G formula based on SCr of 9.33 mg/dL (H)).   Assessment: 65 yo male with PMH of atrial fibrillation on warfarin PTA admitted with respiratory failure. Warfarin currently on hold due to need for thrombectomy of left thigh AV graft that is tentatively scheduled for today although INR 2.05 this morning. INR slow to trend do9wn below 2. In the mean time, he is being bridged with a heparin drip.   Heparin level is SUBtherapeutic at 0.13 on 1500 units/hr. Spoke with RN and no infusion related issues or bleeding, CBC is stable. Will try to target low end of goal since INR just above 2.  Goal of Therapy:  Heparin level 0.3-0.7 units/ml Monitor platelets by anticoagulation protocol: Yes   Plan:  1. Increase heparin infusion to 1700 units/hr 2. 8 hr heparin level 3. Daily heparin level and CBC 4. Monitor for s/sx of bleeding 5. Follow up anticoagulation plans post thrombectomy  Renold Genta, PharmD, BCPS Clinical Pharmacist Phone for today - [place] - [phone] 09/02/2016 8:37 AM

## 2016-09-02 NOTE — Clinical Social Work Note (Signed)
Clinical Social Work Assessment  Patient Details  Name: Noah Lewis MRN: [id number] Date of Birth: April 18, 1951  Date of referral:  09/02/16               Reason for consult:  Facility Placement                Permission sought to share information with:  Chartered certified accountant granted to share information::  Yes, Verbal Permission Granted  Name::        Agency::  [name]  Relationship::     Contact Information:     Housing/Transportation Living arrangements for the past 2 months:  [place] of Information:  Patient Patient Interpreter Needed:  None Criminal Activity/Legal Involvement Pertinent to Current Situation/Hospitalization:  No - Comment as needed Significant Relationships:  Siblings Lives with:  Siblings Do you feel safe going back to the place where you live?  Yes Need for family participation in patient care:  No (Coment)  Care giving concerns:  none   Facilities manager / plan:  CSW met with patient to confirm that he is LTC resident at [facility].  Employment status:  Retired Forensic scientist:    PT Recommendations:  Not assessed at this time Information / Referral to community resources:  [place]  Patient/Family's Response to care:  Agreeable to return to [facility] when stable.  Patient/Family's Understanding of and Emotional Response to Diagnosis, Current Treatment, and Prognosis:  No questions or concerns- hopeful for DC from hospital soon.  Emotional Assessment Appearance:  Appears stated age Attitude/Demeanor/Rapport:    Affect (typically observed):  Appropriate Orientation:  Oriented to Situation, Oriented to  Time, Oriented to Place, Oriented to Self Alcohol / Substance use:  Not Applicable Psych involvement (Current and /or in the community):     Discharge Needs  Concerns to be addressed:  Care Coordination Readmission within the last 30 days:  No Current discharge  risk:  Physical Impairment Barriers to Discharge:  Continued Medical Work up   Jorge Ny, LCSW 09/02/2016, 12:21 PM

## 2016-09-03 ENCOUNTER — Inpatient Hospital Stay (HOSPITAL_COMMUNITY): Payer: Medicare Other

## 2016-09-03 ENCOUNTER — Encounter (HOSPITAL_COMMUNITY): Payer: Self-pay | Admitting: Interventional Radiology

## 2016-09-03 DIAGNOSIS — I132 Hypertensive heart and chronic kidney disease with heart failure and with stage 5 chronic kidney disease, or end stage renal disease: Secondary | ICD-10-CM | POA: Diagnosis not present

## 2016-09-03 DIAGNOSIS — R0603 Acute respiratory distress: Secondary | ICD-10-CM | POA: Diagnosis not present

## 2016-09-03 DIAGNOSIS — Z992 Dependence on renal dialysis: Secondary | ICD-10-CM | POA: Diagnosis not present

## 2016-09-03 DIAGNOSIS — J810 Acute pulmonary edema: Secondary | ICD-10-CM | POA: Diagnosis not present

## 2016-09-03 DIAGNOSIS — N186 End stage renal disease: Secondary | ICD-10-CM | POA: Diagnosis not present

## 2016-09-03 HISTORY — PX: IR GENERIC HISTORICAL: IMG1180011

## 2016-09-03 LAB — GLUCOSE, CAPILLARY: Glucose-Capillary: 103 mg/dL — ABNORMAL HIGH (ref 65–99)

## 2016-09-03 LAB — PROTIME-INR
INR: 1.38
Prothrombin Time: 17.1 seconds — ABNORMAL HIGH (ref 11.4–15.2)

## 2016-09-03 MED ORDER — FENTANYL CITRATE (PF) 100 MCG/2ML IJ SOLN
INTRAMUSCULAR | Status: AC | PRN
  Administered 2016-09-03: 25 ug via INTRAVENOUS

## 2016-09-03 MED ORDER — FENTANYL CITRATE (PF) 100 MCG/2ML IJ SOLN
INTRAMUSCULAR | Status: AC
  Filled 2016-09-03: qty 2

## 2016-09-03 MED ORDER — LIDOCAINE HCL 1 % IJ SOLN
INTRAMUSCULAR | Status: AC
  Filled 2016-09-03: qty 20

## 2016-09-03 MED ORDER — ALTEPLASE 2 MG IJ SOLR
INTRAMUSCULAR | Status: AC
  Filled 2016-09-03: qty 2

## 2016-09-03 MED ORDER — MIDAZOLAM HCL 2 MG/2ML IJ SOLN
INTRAMUSCULAR | Status: AC | PRN
  Administered 2016-09-03: 0.5 mg via INTRAVENOUS

## 2016-09-03 MED ORDER — HEPARIN SODIUM (PORCINE) 1000 UNIT/ML IJ SOLN
INTRAMUSCULAR | Status: AC
  Filled 2016-09-03: qty 1

## 2016-09-03 MED ORDER — IOPAMIDOL (ISOVUE-300) INJECTION 61%
INTRAVENOUS | Status: AC
  Administered 2016-09-03: 40 mL
  Filled 2016-09-03: qty 100

## 2016-09-03 MED ORDER — MIDAZOLAM HCL 2 MG/2ML IJ SOLN
INTRAMUSCULAR | Status: AC
  Filled 2016-09-03: qty 2

## 2016-09-03 MED ORDER — LIDOCAINE HCL 1 % IJ SOLN
INTRAMUSCULAR | Status: AC | PRN
  Administered 2016-09-03: 10 mL

## 2016-09-03 NOTE — Progress Notes (Signed)
Pt refused CBG to be checked, insulin not given since blood sugar is unknown, will continue to monitor pt

## 2016-09-03 NOTE — Progress Notes (Signed)
Pt refused his blood drawn this morning and refused  CBG checked last night his attending physician has been informed

## 2016-09-03 NOTE — Sedation Documentation (Signed)
Patient is resting comfortably. 

## 2016-09-03 NOTE — Progress Notes (Signed)
ANTICOAGULATION CONSULT NOTE - Follow Up Consult  Pharmacy Consult for Heparin Indication: atrial fibrillation  Allergies  Allergen Reactions  . Penicillins Other (See Comments)    Reaction:  Unknown     Patient Measurements: Height: 5\' 7"  (170.2 cm) Weight: 180 lb 4.8 oz (81.8 kg) IBW/kg (Calculated) : 66.1   Labs:  Recent Labs  09/01/16 0516 09/01/16 1510 09/02/16 0529 09/02/16 1849 09/03/16 0804  HGB 11.2*  --  10.3*  --   --   HCT 34.9*  --  32.4*  --   --   PLT 210  --  205  --   --   LABPROT  --   --  23.5*  --  17.1*  INR  --   --  2.05  --  1.38  HEPARINUNFRC <0.10* 0.42 0.13* 0.38  --   CREATININE 6.72*  6.77*  --  9.33*  --   --     Estimated Creatinine Clearance: 8.1 mL/min (by C-G formula based on SCr of 9.33 mg/dL (H)).   Medications:  Heparin @ 1700 units/hr  Assessment: 65yom continues on IV  heparin infusion for h/o  afib while coumadin on hold pending thrombectomy of left thigh AV graft. Heparin level was now therapeutic at 0.38 last night after rate increased to 1700 units/hr.  Today INR is down to 1.38 s/p vitamin K 2.5 mg po x1 dose on 12/11 Heparin level daily ordered but not drawn this AM.  Ordered STAT Heparin level this AM, which was canceled ~11:30 AM due to PA told RN to stop IV heparin drip in preparation for thrombectomy today.   Goal of Therapy:  Heparin level 0.3-0.7 units/ml Monitor platelets by anticoagulation protocol: Yes   Plan:  Pharmacist will f/u after thrombectomy today  for timing of restart of IV heparin and when to resume PTA coumadin.   Thank you for allowing pharmacy to be part of this patients care team. Nicole Cella, [place] Clinical Pharmacist Pager: [phone]  09/03/2016,1:15 PM

## 2016-09-03 NOTE — Progress Notes (Addendum)
  [facility] Progress Note   Subjective: no c/o, INR 1.3 today  Vitals:   09/02/16 0601 09/02/16 2113 09/02/16 2126 09/03/16 0644  BP: 101/61 124/63  (!) 132/50  Pulse: 88 93 95 92  Resp: 20 18 20 18   Temp: 97.6 F (36.4 C) 97 F (36.1 C)  99.7 F (37.6 C)  TempSrc: Oral Axillary  Axillary  SpO2: 100% 100% 97% 100%  Weight:    81.8 kg (180 lb 4.8 oz)  Height:        Inpatient medications: . amiodarone  400 mg Oral Daily  . aspirin  81 mg Oral Daily  . darbepoetin (ARANESP) injection - DIALYSIS  40 mcg Intravenous Q Thu-HD  . doxercalciferol  2 mcg Intravenous Q T,Th,Sa-HD  . insulin aspart  0-5 Units Subcutaneous QHS  . insulin aspart  0-9 Units Subcutaneous TID WC  . lamoTRIgine  25 mg Oral BID  . levothyroxine  100 mcg Oral QAC breakfast  . mouth rinse  15 mL Mouth Rinse BID  . mirtazapine  15 mg Oral QHS  . sucroferric oxyhydroxide  1,000 mg Oral TID WC  . vancomycin  1,000 mg Intravenous to XRAY   . heparin 1,700 Units/hr (09/02/16 2243)   sodium chloride, ipratropium-albuterol, phenol  Exam: Alert, no distress NO jvd Chest clear bilat RRR w small SEM, no rg Abd soft obese ntnd Ext no LE edema R fem TDC / L thigh AVG no bruit  12/7 CXR pulm edema   Dialysis: [facility]  83kg  4h   2/2 bath  Hep yes   R fem HD cath/ L thigh AVG placed 10/30 Hect 2 M-40 last 11/28 [place] Labs: Hb 10.2, Ca 8.5 P 6  pth 537     Assessment: 1  Pulm edema/ acute resp failure - resolved, at baseline 2  Clotted L thigh AVG - placed 10/30, declot per IR today 3  ESRD HD tts - AVG should be ready to use 4  Vol - stable, at dry wt 5  Anemia cont esa here 6  Sec HPTH cont meds 7  Afib - coumadin on hold for procedure, on IV hep 8  COPD  Plan - for declot today; pt requesting no HD today, will postpone HD til tomorrow   Kelly Splinter MD [facility] pager [phone]   09/03/2016, 1:30 PM    Recent Labs Lab 08/31/16 0147 09/01/16 0516  09/02/16 0529  NA 135 134*  134* 134*  K 3.9 3.8  3.8 4.2  CL 93* 90*  91* 91*  CO2 25 30  27  21*  GLUCOSE 99 97  100* 101*  BUN 26* 29*  29* 54*  CREATININE 7.86* 6.72*  6.77* 9.33*  CALCIUM 8.8* 9.0  9.1 8.7*  PHOS 7.5* 7.7* 10.4*    Recent Labs Lab 08/31/16 0147 09/01/16 0516 09/02/16 0529  ALBUMIN 3.9 3.9 3.6    Recent Labs Lab 08/31/16 0147 09/01/16 0516 09/02/16 0529  WBC 12.4* 12.5* 10.8*  NEUTROABS 10.2* 10.0* 8.1*  HGB 10.0* 11.2* 10.3*  HCT 31.5* 34.9* 32.4*  MCV 87.0 86.8 86.4  PLT 155 210 205   Iron/TIBC/Ferritin/ %Sat    Component Value Date/Time   IRON 29 (L) 05/30/2015 0936   TIBC 290 05/30/2015 0936   FERRITIN 406 (H) 05/30/2015 0936   IRONPCTSAT 10 (L) 05/30/2015 [id number]

## 2016-09-03 NOTE — Progress Notes (Signed)
ANTICOAGULATION CONSULT NOTE - Follow Up Consult  Pharmacy Consult for Heparin Indication: atrial fibrillation  Allergies  Allergen Reactions  . Penicillins Other (See Comments)    Reaction:  Unknown     Patient Measurements: Height: 5\' 7"  (170.2 cm) Weight: 180 lb 4.8 oz (81.8 kg) IBW/kg (Calculated) : 66.1   Labs:  Recent Labs  09/01/16 0516 09/01/16 1510 09/02/16 0529 09/02/16 1849 09/03/16 0804  HGB 11.2*  --  10.3*  --   --   HCT 34.9*  --  32.4*  --   --   PLT 210  --  205  --   --   LABPROT  --   --  23.5*  --  17.1*  INR  --   --  2.05  --  1.38  HEPARINUNFRC <0.10* 0.42 0.13* 0.38  --   CREATININE 6.72*  6.77*  --  9.33*  --   --     Estimated Creatinine Clearance: 8.1 mL/min (by C-G formula based on SCr of 9.33 mg/dL (H)).     Assessment: 65yom started on IV  heparin infusion for h/o  afib while coumadin on hold pending thrombectomy of left thigh AV graft. Heparin level was therapeutic at 0.38 last night on drip rate 1700 units/hr.  Today INR is down to 1.38 s/p vitamin K 2.5 mg po x1 dose on 12/11 IV heparin drip on hold this am per IR PA in preparation for thrombectomy today.  S/p IR - shuntogram clear- AVG patent restart heparin per Dr. Kathlene Cote.    Goal of Therapy:  Heparin level 0.3-0.7 units/ml Monitor platelets by anticoagulation protocol: Yes   Plan:  Heaprin dtip 1700 uts/hr Daily HL, CBC   Bonnita Nasuti Pharm.D. CPP, BCPS Clinical Pharmacist [phone] 09/03/2016 7:04 PM

## 2016-09-03 NOTE — Progress Notes (Signed)
[facility] PROGRESS NOTE  Noah Lewis [id number] DOB: 19-Aug-1951 DOA: 08/29/2016   PCP: Elyse Jarvis, MD     Assessment/Plan: Active Problems:   Hyperkalemia   65 year old male with PMH of COPD, HTN, Type 2 DM, ESRD on HD T/R/S presents to the ED from [place] on 12/7 with dyspnea, on arrival patient was only able to speak 1-2 words at a time without pausing. Patient reported he has missed Tuesday dialysis (per medical record patient refused session because he was experiencing diarrhea) sessions, most recent session was on Saturday 12/2. Potassium 7.2 with wide complex EKG changes. Nephrology consulted. While in ED patient went into Respiratory distress requiring intubation. PCCM called to admit. Patient intubated 12/7. Extubated 12/9.  Patient had recent hospitalization from 10/24-10/31 for pulmonary edema related to missed dialysis session. During this stay was placed on coumadin, and had a left femoral AV loop graft placed on 10/30. Was discharged to assisted living facility.   [facility] assumed care  12/12   Assessment and plan Acute on Chronic Hypoxic Respiratory Failure - Improving. Secondary to pulmonary edema. H/O COPD, OSA (non-compliant with CPAP)  Weaning FiO2, currently on 2 L of oxygen Continuing Pulse Oximetry Duoneb q6hr prn Incentive Spirometry for pulmonary toilette    Atrial fibrillation [place] score 6, coumadin on hold  Cont home amiodarone & ASA Hold home Lopressor, heart rate controlled   Acute on Chronic Diastolic Heart Failure  Chronically Occluded left Subclavian vein  H/O HTN, Afib (on home Amiodarone/Coumadin), & CAD  Continue Telemetry monitoring     Thrombosed L Thigh AV Graft IR to do thrombectomy on AV Graft 12/12 assuming INR w/in range Refusing labs this morning  ESRD - Dialysis T, Th, & Sat. Hyperkalemia - Resolved. H/O Right Kidney Mass (Being follow outpatient) New fistula LLE 10/30 Clotted L  thigh AVG - placed 10/30, declot per IR once INR ok,1.38 today   Anemia - Mild. No active bleeding. Coagulopathy - Secondary to Coumadin. Holding Coumadin INR daily  Heparin gtt per pharmacy protocol pending IR  Procedure    DM Type 2 Hypothyroidism  Synthroid 149mcg daily Switching Accu-Checks to qAC & HS SSI per Sensitive Algorithm    Drug Induced Encephalopathy - Resolved. H/O Bipolar disorder/depression Continuing home remeron and lamicatal  D/C Fentanyl & Propofol     DVT prophylaxsis heparin drip  Code Status:  Full code     Family Communication: Discussed in detail with the patient, all imaging results, lab results explained to the patient   Disposition Plan: Per nephrology,  Per IR      Consultants:  Nephrology  Interventional radiology  Critical care    Procedures:  None  Antibiotics: Anti-infectives    Start     Dose/Rate Route Frequency Ordered Stop   09/03/16 1000  vancomycin (VANCOCIN) IVPB 1000 mg/200 mL premix     1,000 mg 200 mL/hr over 60 Minutes Intravenous To Radiology 09/02/16 1114 09/04/16 1000   09/02/16 0000  vancomycin (VANCOCIN) IVPB 1000 mg/200 mL premix  Status:  Discontinued     1,000 mg 200 mL/hr over 60 Minutes Intravenous To Radiology 09/01/16 1620 09/02/16 1114         HPI/Subjective: Pt refused his blood drawn this morning and refused  CBG   Objective: Vitals:   09/02/16 0601 09/02/16 2113 09/02/16 2126 09/03/16 0644  BP: 101/61 124/63  (!) 132/50  Pulse: 88 93 95 92  Resp: 20 18 20  18  Temp: 97.6 F (36.4 C) 97 F (36.1 C)  99.7 F (37.6 C)  TempSrc: Oral Axillary  Axillary  SpO2: 100% 100% 97% 100%  Weight:    81.8 kg (180 lb 4.8 oz)  Height:        Intake/Output Summary (Last 24 hours) at 09/03/16 [id number] Last data filed at 09/03/16 0500  Gross per 24 hour  Intake           654.32 ml  Output                0 ml  Net           654.32 ml    Exam:  Examination:  General exam: Appears  calm and comfortable  Respiratory system: Clear to auscultation. Respiratory effort normal. Cardiovascular system: S1 & S2 heard, RRR. No JVD, murmurs, rubs, gallops or clicks. No pedal edema. Gastrointestinal system: Abdomen is nondistended, soft and nontender. No organomegaly or masses felt. Normal bowel sounds heard. Central nervous system: Alert and oriented. No focal neurological deficits. Extremities: Symmetric 5 x 5 power. Skin: No rashes, lesions or ulcers Psychiatry: Judgement and insight appear normal. Mood & affect appropriate.     Data Reviewed: I have personally reviewed following labs and imaging studies  Micro Results Recent Results (from the past 240 hour(s))  MRSA PCR Screening     Status: None   Collection Time: 08/29/16  9:52 AM  Result Value Ref Range Status   MRSA by PCR NEGATIVE NEGATIVE Final    Comment:        The GeneXpert MRSA Assay (FDA approved for NASAL specimens only), is one component of a comprehensive MRSA colonization surveillance program. It is not intended to diagnose MRSA infection nor to guide or monitor treatment for MRSA infections.     Radiology Reports Dg Chest Port 1 View  Result Date: 08/29/2016 CLINICAL DATA:  Respiratory failure EXAM: PORTABLE CHEST 1 VIEW COMPARISON:  08/29/2016 at 06:14 FINDINGS: Endotracheal tube is 5 cm above the carina. Nasogastric tube extends into the stomach and beyond the inferior edge of the image. Central airspace opacities persist bilaterally without significant interval change. IMPRESSION: Support equipment appears satisfactorily positioned. No significant interval change in the bilateral airspace opacities. Electronically Signed   By: Andreas Newport M.D.   On: 08/29/2016 07:01   Dg Chest Port 1 View  Result Date: 08/29/2016 CLINICAL DATA:  Dyspnea. EXAM: PORTABLE CHEST 1 VIEW COMPARISON:  07/17/2016 FINDINGS: Vascular and interstitial congestive changes. Central airspace opacity. Probable small  effusions. Marked cardiomegaly. IMPRESSION: The findings likely represent congestive heart failure. Electronically Signed   By: Andreas Newport M.D.   On: 08/29/2016 06:27     CBC  Recent Labs Lab 08/29/16 0618 08/30/16 0720 08/31/16 0147 09/01/16 0516 09/02/16 0529  WBC 11.1* 10.9* 12.4* 12.5* 10.8*  HGB 11.2* 9.6* 10.0* 11.2* 10.3*  HCT 35.5* 29.9* 31.5* 34.9* 32.4*  PLT 242 151 155 210 205  MCV 87.9 85.4 87.0 86.8 86.4  MCH 27.7 27.4 27.6 27.9 27.5  MCHC 31.5 32.1 31.7 32.1 31.8  RDW 19.2* 19.3* 18.9* 18.7* 18.5*  LYMPHSABS 0.4*  --  0.6* 1.0 1.5  MONOABS 0.9  --  1.5* 1.2* 0.8  EOSABS 0.0  --  0.0 0.3 0.4  BASOSABS 0.0  --  0.0 0.0 0.0    Chemistries   Recent Labs Lab 08/29/16 2136 08/30/16 0720 08/31/16 0147 09/01/16 0516 09/02/16 0529  NA 138 136 135 134*  134* 134*  K 4.8 4.4  3.9 3.8  3.8 4.2  CL 93* 92* 93* 90*  91* 91*  CO2 23 23 25 30  27  21*  GLUCOSE 55* 113* 99 97  100* 101*  BUN 47* 50* 26* 29*  29* 54*  CREATININE 11.01* 12.06* 7.86* 6.72*  6.77* 9.33*  CALCIUM 8.9 8.6* 8.8* 9.0  9.1 8.7*  MG  --  2.0 2.3 2.5* 2.5*   ------------------------------------------------------------------------------------------------------------------ estimated creatinine clearance is 8.1 mL/min (by C-G formula based on SCr of 9.33 mg/dL (H)). ------------------------------------------------------------------------------------------------------------------ No results for input(s): HGBA1C in the last 72 hours. ------------------------------------------------------------------------------------------------------------------  Recent Labs  09/01/16 0516  TRIG 96   ------------------------------------------------------------------------------------------------------------------ No results for input(s): TSH, T4TOTAL, T3FREE, THYROIDAB in the last 72 hours.  Invalid input(s):  FREET3 ------------------------------------------------------------------------------------------------------------------ No results for input(s): VITAMINB12, FOLATE, FERRITIN, TIBC, IRON, RETICCTPCT in the last 72 hours.  Coagulation profile  Recent Labs Lab 08/29/16 0619 08/30/16 0720 08/31/16 0147 09/02/16 0529  INR 2.39 2.31 2.10 2.05    No results for input(s): DDIMER in the last 72 hours.  Cardiac Enzymes  Recent Labs Lab 08/29/16 1016 08/29/16 1423 08/29/16 2307  TROPONINI 0.04* 0.04* 0.04*   ------------------------------------------------------------------------------------------------------------------ Invalid input(s): POCBNP   CBG:  Recent Labs Lab 09/01/16 2221 09/02/16 0800 09/02/16 1229 09/02/16 1637 09/03/16 0816  GLUCAP 137* 113* 138* 125* 103*       Studies: No results found.    Lab Results  Component Value Date   HGBA1C 5.7 (H) 04/28/2015   HGBA1C (H) 10/26/2007    6.6 (NOTE)   The ADA recommends the following therapeutic goals for glycemic   control related to Hgb A1C measurement:   Goal of Therapy:   < 7.0% Hgb A1C   Action Suggested:  > 8.0% Hgb A1C   Ref:  Diabetes Care, 22, Suppl. 1, 1999   HGBA1C 6.4 (H) 04/24/2007   Lab Results  Component Value Date   LDLCALC 35 05/03/2016   CREATININE 9.33 (H) 09/02/2016       Scheduled Meds: . amiodarone  400 mg Oral Daily  . aspirin  81 mg Oral Daily  . darbepoetin (ARANESP) injection - DIALYSIS  40 mcg Intravenous Q Thu-HD  . doxercalciferol  2 mcg Intravenous Q T,Th,Sa-HD  . insulin aspart  0-5 Units Subcutaneous QHS  . insulin aspart  0-9 Units Subcutaneous TID WC  . lamoTRIgine  25 mg Oral BID  . levothyroxine  100 mcg Oral QAC breakfast  . mouth rinse  15 mL Mouth Rinse BID  . mirtazapine  15 mg Oral QHS  . sucroferric oxyhydroxide  1,000 mg Oral TID WC  . vancomycin  1,000 mg Intravenous to XRAY   Continuous Infusions: . heparin 1,700 Units/hr (09/02/16 2243)      LOS: 5 days    Time spent: >30 MINS    [facility]  [facility] Pager [phone]. If 7PM-7AM, please contact night-coverage at [email or web address], password [email or web address] 09/03/2016, 8:23 AM  LOS: 5 days

## 2016-09-03 NOTE — Progress Notes (Signed)
Heparin drip stopped per PA verbal order. Patient's INR in normal range, patient will have the intervention today. Patient refused for lunch his CBG to be checked. Will continue to monitor.

## 2016-09-03 NOTE — Procedures (Signed)
L thigh AV shuntogram Widely patent AV graft No comp/EBL

## 2016-09-04 DIAGNOSIS — J9601 Acute respiratory failure with hypoxia: Secondary | ICD-10-CM | POA: Diagnosis not present

## 2016-09-04 DIAGNOSIS — I132 Hypertensive heart and chronic kidney disease with heart failure and with stage 5 chronic kidney disease, or end stage renal disease: Secondary | ICD-10-CM | POA: Diagnosis not present

## 2016-09-04 DIAGNOSIS — Z992 Dependence on renal dialysis: Secondary | ICD-10-CM | POA: Diagnosis not present

## 2016-09-04 DIAGNOSIS — R0603 Acute respiratory distress: Secondary | ICD-10-CM | POA: Diagnosis not present

## 2016-09-04 DIAGNOSIS — E875 Hyperkalemia: Secondary | ICD-10-CM | POA: Diagnosis not present

## 2016-09-04 DIAGNOSIS — N186 End stage renal disease: Secondary | ICD-10-CM | POA: Diagnosis not present

## 2016-09-04 LAB — BASIC METABOLIC PANEL
Anion gap: 24 — ABNORMAL HIGH (ref 5–15)
BUN: 97 mg/dL — ABNORMAL HIGH (ref 6–20)
CHLORIDE: 89 mmol/L — AB (ref 101–111)
CO2: 20 mmol/L — ABNORMAL LOW (ref 22–32)
Calcium: 8.6 mg/dL — ABNORMAL LOW (ref 8.9–10.3)
Creatinine, Ser: 14.08 mg/dL — ABNORMAL HIGH (ref 0.61–1.24)
GFR calc non Af Amer: 3 mL/min — ABNORMAL LOW (ref >60)
GFR, EST AFRICAN AMERICAN: 4 mL/min — AB (ref >60)
Glucose, Bld: 92 mg/dL (ref 65–99)
POTASSIUM: 4.8 mmol/L (ref 3.5–5.1)
SODIUM: 133 mmol/L — AB (ref 135–145)

## 2016-09-04 LAB — CBC WITH DIFFERENTIAL/PLATELET
BASOS ABS: 0 10*3/uL (ref 0.0–0.1)
BASOS PCT: 0 %
Eosinophils Absolute: 0.2 10*3/uL (ref 0.0–0.7)
Eosinophils Relative: 2 %
HEMATOCRIT: 30.2 % — AB (ref 39.0–52.0)
HEMOGLOBIN: 9.7 g/dL — AB (ref 13.0–17.0)
Lymphocytes Relative: 7 %
Lymphs Abs: 0.7 10*3/uL (ref 0.7–4.0)
MCH: 26.7 pg (ref 26.0–34.0)
MCHC: 32.1 g/dL (ref 30.0–36.0)
MCV: 83.2 fL (ref 78.0–100.0)
Monocytes Absolute: 1.3 10*3/uL — ABNORMAL HIGH (ref 0.1–1.0)
Monocytes Relative: 12 %
NEUTROS ABS: 8.3 10*3/uL — AB (ref 1.7–7.7)
NEUTROS PCT: 79 %
Platelets: 233 10*3/uL (ref 150–400)
RBC: 3.63 MIL/uL — ABNORMAL LOW (ref 4.22–5.81)
RDW: 17.7 % — ABNORMAL HIGH (ref 11.5–15.5)
WBC: 10.5 10*3/uL (ref 4.0–10.5)

## 2016-09-04 LAB — HEPARIN LEVEL (UNFRACTIONATED): Heparin Unfractionated: 0.39 IU/mL (ref 0.30–0.70)

## 2016-09-04 LAB — PROTIME-INR
INR: 1.78
PROTHROMBIN TIME: 21 s — AB (ref 11.4–15.2)

## 2016-09-04 MED ORDER — SODIUM CHLORIDE 0.9 % IV SOLN: 100.0000 mL | INTRAVENOUS | Status: DC | PRN

## 2016-09-04 MED ORDER — DOXERCALCIFEROL 4 MCG/2ML IV SOLN
INTRAVENOUS | Status: AC
  Administered 2016-09-04: 2 ug via INTRAVENOUS
  Filled 2016-09-04: qty 2

## 2016-09-04 MED ORDER — HEPARIN SODIUM (PORCINE) 1000 UNIT/ML DIALYSIS: 1000.0000 [IU] | INTRAMUSCULAR | Status: DC | PRN

## 2016-09-04 MED ORDER — HEPARIN SODIUM (PORCINE) 1000 UNIT/ML DIALYSIS: 2000.0000 [IU] | INTRAMUSCULAR | Status: DC | PRN

## 2016-09-04 MED ORDER — WARFARIN SODIUM 5 MG PO TABS
5.0000 mg | ORAL_TABLET | ORAL | Status: AC
  Administered 2016-09-04: 5 mg via ORAL
  Filled 2016-09-04: qty 1

## 2016-09-04 MED ORDER — ALTEPLASE 2 MG IJ SOLR: 2.0000 mg | Freq: Once | INTRAMUSCULAR | Status: DC | PRN

## 2016-09-04 MED ORDER — VANCOMYCIN HCL IN DEXTROSE 1-5 GM/200ML-% IV SOLN
INTRAVENOUS | Status: AC
  Administered 2016-09-04: 1000 mg via INTRAVENOUS
  Filled 2016-09-04: qty 200

## 2016-09-04 MED ORDER — LIDOCAINE-PRILOCAINE 2.5-2.5 % EX CREA: 1.0000 "application " | TOPICAL_CREAM | CUTANEOUS | Status: DC | PRN

## 2016-09-04 MED ORDER — DARBEPOETIN ALFA 40 MCG/0.4ML IJ SOSY
PREFILLED_SYRINGE | INTRAMUSCULAR | Status: AC
  Administered 2016-09-04: 40 ug via INTRAVENOUS
  Filled 2016-09-04: qty 0.4

## 2016-09-04 MED ORDER — LIDOCAINE HCL (PF) 1 % IJ SOLN: 5.0000 mL | INTRAMUSCULAR | Status: DC | PRN

## 2016-09-04 MED ORDER — WARFARIN - PHARMACIST DOSING INPATIENT
Freq: Every day | Status: DC
  Administered 2016-09-04: 11:00:00

## 2016-09-04 MED ORDER — PENTAFLUOROPROP-TETRAFLUOROETH EX AERO: 1.0000 "application " | INHALATION_SPRAY | CUTANEOUS | Status: DC | PRN

## 2016-09-04 NOTE — Progress Notes (Signed)
ANTICOAGULATION CONSULT NOTE - Follow Up Consult  Pharmacy Consult for Heparin, and restart Coumadin  Indication: atrial fibrillation  Allergies  Allergen Reactions  . Penicillins Other (See Comments)    Reaction:  Unknown     Patient Measurements: Height: 5\' 7"  (170.2 cm) Weight: 180 lb 9.6 oz (81.9 kg) IBW/kg (Calculated) : 66.1   Labs:  Recent Labs  09/02/16 0529 09/02/16 1849 09/03/16 0804 09/04/16 0459  HGB 10.3*  --   --  9.7*  HCT 32.4*  --   --  30.2*  PLT 205  --   --  233  LABPROT 23.5*  --  17.1* 21.0*  INR 2.05  --  1.38 1.78  HEPARINUNFRC 0.13* 0.38  --  0.39  CREATININE 9.33*  --   --  14.08*    Estimated Creatinine Clearance: 5.4 mL/min (by C-G formula based on SCr of 14.08 mg/dL (H)).     Assessment: 65yom started on IV  heparin infusion for h/o  afib while coumadin on hold pending possible thrombectomy of left thigh AV graft. On 09/03/16 S/p IR - shuntogram clear- AVG patent.  Heparin infusion was restarted at previous therapeutic rate of 1700 units/hr.  Today HL is therapeutic =0.39 and INR is 1.78. INR increased from yesterday's result of 1.38.  Has been off coumadin since last given on 08/29/16.  S/p vitamin K 2.5mg  po x1 on 12/11.  Hgb dropped to 9.7 post IR shuntogram, pltc wnl  Urine noted last night to be red, bloody.  Currently no bleeding noted per RN's report. Dr. Tana Coast has discontinued the IV heparin and  RN reports pt to go to Hemodialysis soon then discharge today after HD    Goal of Therapy:  INR 2-3 Monitor platelets by anticoagulation protocol: Yes   Plan:  Dr. Tana Coast has dc'd the IV heparin  RN reports pt to go to Hemodialysis soon then dc home after HD Resume coumadin. Give Coumadin 5mg  po now, then plan to resume PTA dose of 7.5 mg daily. Pt to f/u with PCP for coumadin /INR monitoring.     Thank you for allowing pharmacy to be part of this patients care team. Nicole Cella, RPh Clinical Pharmacist Pager: [phone] 09/04/2016 10:36  AM

## 2016-09-04 NOTE — Discharge Summary (Signed)
Physician Discharge Summary   Patient ID: Noah Lewis MRN: [id number] DOB/AGE: 1950/12/26 65 y.o.  Admit date: 08/29/2016 Discharge date: 09/04/2016  Primary Care Physician:  Elyse Jarvis, MD  Discharge Diagnoses:    Acute on Chronic Hypoxic Respiratory Failure  Acute pulmonary edema COPD OSA noncompliant with CPAP Atrial fibrillation . Hyperkalemia Acute on chronic diastolic CHF Chronically occluded left subclavian vein Thrombosed left thigh AV graft ESRD on hemodialysis Tuesday Thursday and Saturday History of right kidney mass followed outpatient Anemia of chronic disease  Consults:   Nephrology [facility] CM Interventional radiology  Recommendations for Outpatient Follow-up:  1. Please repeat CBC/BMET, PT, INR at next visit   DIET: Renal diet  Allergies:   Allergies  Allergen Reactions  . Penicillins Other (See Comments)    Reaction:  Unknown      DISCHARGE MEDICATIONS: Current Discharge Medication List    CONTINUE these medications which have NOT CHANGED   Details  acetaminophen (TYLENOL) 325 MG tablet Take 650 mg by mouth every 6 (six) hours as needed for mild pain, fever or headache.    amiodarone (PACERONE) 400 MG tablet Take 1 tablet (400 mg total) by mouth daily. Qty: 30 tablet, Refills: 0    aspirin 81 MG chewable tablet Chew 1 tablet (81 mg total) by mouth daily. Qty: 30 tablet, Refills: 11    b complex-vitamin c-folic acid (NEPHRO-VITE) 0.8 MG TABS tablet Take 1 tablet by mouth 2 (two) times daily.    calcitRIOL (ROCALTROL) 0.5 MCG capsule Take 1 capsule (0.5 mcg total) by mouth every other day.    Capsaicin (CAPSAGEL) 0.025 % GEL Apply 1 application topically 2 (two) times daily as needed (for lower back pain).    lamoTRIgine (LAMICTAL) 25 MG tablet Take 2 tablets (50 mg total) by mouth daily. Qty: 2 tablet, Refills: 0    levothyroxine (SYNTHROID, LEVOTHROID) 100 MCG tablet Take 100 mcg by mouth daily before breakfast.    loratadine  (CLARITIN) 10 MG tablet Take 1 tablet (10 mg total) by mouth daily. Qty: 30 tablet, Refills: 11   Associated Diagnoses: COPD, moderate (HCC)    mirtazapine (REMERON SOL-TAB) 15 MG disintegrating tablet Take 1 tablet (15 mg total) by mouth at bedtime. Qty: 2 tablet, Refills: 0    polyethylene glycol (MIRALAX / GLYCOLAX) packet Take 17 g by mouth daily as needed for mild constipation.     saccharomyces boulardii (FLORASTOR) 250 MG capsule Take 250 mg by mouth 2 (two) times daily.    sucroferric oxyhydroxide (VELPHORO) 500 MG chewable tablet Chew 500-1,000 mg by mouth See admin instructions. Give 1000mg  with meals, May also give 500 mg by mouth as needed with snacks.    Travoprost, BAK Free, (TRAVATAN) 0.004 % SOLN ophthalmic solution Place 1 drop into both eyes at bedtime.    warfarin (COUMADIN) 7.5 MG tablet Take 7.5 mg by mouth daily.    ipratropium-albuterol (DUONEB) 0.5-2.5 (3) MG/3ML SOLN Take 3 mLs by nebulization every 6 (six) hours as needed (for wheezing/shortness of breath).      STOP taking these medications     metoprolol tartrate (LOPRESSOR) 25 MG tablet          Brief H and P: For complete details please refer to admission H and P, but in brief75 year old male with PMH of COPD, HTN, Type 2 DM, ESRD on HD T/R/S presents to the ED from [facility] on 12/7 with dyspnea, on arrival patient was only able to speak 1-2 words at a time without  pausing. Patient reported he has missed Tuesday dialysis (per medical record patient refused session because he was experiencing diarrhea) sessions, most recent session was on Saturday 12/2. Potassium 7.2 with wide complex EKG changes. Nephrology consulted. While in ED patient went into Respiratory distress requiring intubation. PCCM called to admit. Patient intubated 12/7. Extubated 12/9. Patient had recent hospitalization from 10/24-10/31 for pulmonary edema related to missed dialysis session. During this stay was placed on  coumadin, and had a left femoral AV loop graft placed on 10/30. Was discharged to assisted living facility.  [facility] assumed care  12/12   Hospital Course:   Acute on Chronic Hypoxic Respiratory Failure - Improving.  -Secondary to pulmonary edema. He also has a history of COPD and obstructive sleep apnea and non-compliant with CPAP - Patient was admitted to intensive care unit as he went into respiratory distress requiring intubation. Patient was admitted by critical care service. He was intubated on 12/7 and extubated on 12/9. - Currently improved, O2 sats 100% on 2 L - Hemodialysis plan today, patient wants to go home after hemodialysis - Continue duo nebs, home O2 evaluation prior to discharge    Atrial fibrillation [place] score 6 Cont home amiodarone & ASA Hold home Lopressor, heart rate controlled Discontinue heparin drip, restart Coumadin  Acute on Chronic Diastolic Heart Failure  Chronically Occluded left Subclavian vein  H/O HTN, Afib (on home Amiodarone/Coumadin), &CAD  - Volume control with hemodialysis     Thrombosed L Thigh AV Graft IR was consulted and patient had left thigh AV shuntogram done and subsequently widely patent AV graft with no complications  ESRD - Dialysis T, Th, & Sat. Hyperkalemia - Resolved. H/O Right Kidney Mass (Being follow outpatient) New fistula LLE 10/30 Clotted L thigh AVG - placed 10/30 Discussed with nephrology, okay to DC home after hemodialysis today   Anemia - Mild. No active bleeding. Coagulopathy - Secondary to Coumadin. - Restart Coumadin   DM Type 2 Patient was placed on sliding scale insulin while inpatient  Hypothyroidism  Continue Synthroid 166mcg daily    Drug Induced Encephalopathy - Resolved. H/O Bipolar disorder/depression Continuing home remeron and lamicatal   Day of Discharge BP 122/61 (BP Location: Right Arm)   Pulse 78   Temp 97.9 F (36.6 C) (Oral)   Resp 17   Ht 5\' 7"  (1.702 m)   Wt 82.8 kg  (182 lb 8.7 oz)   SpO2 98%   BMI 28.59 kg/m   Physical Exam: General: Alert and awake oriented x3 not in any acute distress. HEENT: anicteric sclera, pupils reactive to light and accommodation CVS: S1-S2 clear no murmur rubs or gallops Chest: clear to auscultation bilaterally, no wheezing rales or rhonchi Abdomen: soft nontender, nondistended, normal bowel sounds Extremities: no cyanosis, clubbing or edema noted bilaterally    The results of significant diagnostics from this hospitalization (including imaging, microbiology, ancillary and laboratory) are listed below for reference.    LAB RESULTS: Basic Metabolic Panel:  Recent Labs Lab 09/02/16 0529 09/04/16 0459  NA 134* 133*  K 4.2 4.8  CL 91* 89*  CO2 21* 20*  GLUCOSE 101* 92  BUN 54* 97*  CREATININE 9.33* 14.08*  CALCIUM 8.7* 8.6*  MG 2.5*  --   PHOS 10.4*  --    Liver Function Tests:  Recent Labs Lab 09/01/16 0516 09/02/16 0529  ALBUMIN 3.9 3.6   No results for input(s): LIPASE, AMYLASE in the last 168 hours. No results for input(s): AMMONIA in the last 168 hours.  CBC:  Recent Labs Lab 09/02/16 0529 09/04/16 0459  WBC 10.8* 10.5  NEUTROABS 8.1* 8.3*  HGB 10.3* 9.7*  HCT 32.4* 30.2*  MCV 86.4 83.2  PLT 205 233   Cardiac Enzymes:  Recent Labs Lab 08/29/16 1423 08/29/16 2307  TROPONINI 0.04* 0.04*   BNP: Invalid input(s): POCBNP CBG:  Recent Labs Lab 09/02/16 1637 09/03/16 0816  GLUCAP 125* 103*    Significant Diagnostic Studies:  Dg Chest Port 1 View  Result Date: 08/29/2016 CLINICAL DATA:  Respiratory failure EXAM: PORTABLE CHEST 1 VIEW COMPARISON:  08/29/2016 at 06:14 FINDINGS: Endotracheal tube is 5 cm above the carina. Nasogastric tube extends into the stomach and beyond the inferior edge of the image. Central airspace opacities persist bilaterally without significant interval change. IMPRESSION: Support equipment appears satisfactorily positioned. No significant interval change  in the bilateral airspace opacities. Electronically Signed   By: Andreas Newport M.D.   On: 08/29/2016 07:01   Dg Chest Port 1 View  Result Date: 08/29/2016 CLINICAL DATA:  Dyspnea. EXAM: PORTABLE CHEST 1 VIEW COMPARISON:  07/17/2016 FINDINGS: Vascular and interstitial congestive changes. Central airspace opacity. Probable small effusions. Marked cardiomegaly. IMPRESSION: The findings likely represent congestive heart failure. Electronically Signed   By: Andreas Newport M.D.   On: 08/29/2016 06:27    2D ECHO:   Disposition and Follow-up: Discharge Instructions    Diet - low sodium heart healthy    Complete by:  As directed    Increase activity slowly    Complete by:  As directed        Playita [place]ada    Elyse Jarvis, MD. Schedule an appointment as soon as possible for a visit in 2 week(s).   Specialty:  Family Medicine Contact information: [address] [place] [address] [phone]            Time spent on Discharge: 35 minutes  Signed:   Zamani Crocker M.D. [facility] 09/04/2016, 4:25 PM Pager: [phone]

## 2016-09-04 NOTE — Discharge Instructions (Signed)

## 2016-09-04 NOTE — Progress Notes (Signed)
Patient will discharge to [facility] Anticipated discharge date: 12/13 Transportation by [facility]- called at 4:40pm Report #: [phone]  [place] signing off.  Jorge Ny, LCSW Clinical Social Worker [phone]

## 2016-09-04 NOTE — Procedures (Signed)
Thigh AVG is widely patent by shuntogram.  Pt refused to let us use the AVG today, said it's not going to be ready until Jan 5th.  Per [facility] notes he was seen by surgeon on 11/17 for postop f/u and they said it would be ready on Dec 2nd.  Did not use AVF today and pt is going home , but the AVG should be ready to use next HD.  Have d/w the patient.    Have d/w primary MD, pulm edema/ SOB resolved, pt back to baseline and stable for dc.  OK for dc after HD today.    I was present at this dialysis session, have reviewed the session itself and made  appropriate changes Kelly Splinter MD [place] pager [phone]   09/04/2016, 3:21 PM

## 2016-09-04 NOTE — Progress Notes (Signed)
Pt is refusing to get his CBG checked. MD made aware.

## 2016-09-04 NOTE — Progress Notes (Signed)
Pt discharged to [facility] via [facility]. No signs of skin breakdown. IV removed and pressure applied. Report given to Lattie Haw, Therapist, sports at [place].

## 2016-09-05 ENCOUNTER — Encounter: Payer: Self-pay | Admitting: Internal Medicine

## 2016-09-05 NOTE — Progress Notes (Signed)
Patient ID: Noah Lewis, male   DOB: Nov 11, 1950, 65 y.o.   MRN: [id number]    HISTORY AND PHYSICAL   DATE: 09/05/2016  Location:    [place] Room Number: 833 B Place of Service: SNF (31)   Extended Emergency Contact Information Primary Emergency Contact: Hayes,Barbara Address: [place], [place] [address] [place] Phone: [phone] Relation: Mother Secondary Emergency Contact: Hayes,[name] Address: [address]          [place], [place] [address] [place] Phone: [phone] Relation: Brother  Advanced Directive information Does Patient Have a Medical Advance Directive?: Yes, Type of Advance Directive: Out of facility DNR (pink MOST or yellow form), Pre-existing out of facility DNR order (yellow form or pink MOST form): Pink MOST form placed in chart (order not valid for inpatient use)  Chief Complaint  Patient presents with  . Readmit To SNF    HPI:    Past Medical History:  Diagnosis Date  . Arthritis   . Asthma   . Cancer ([place])   . COPD (chronic obstructive pulmonary disease) ([place])   . Coronary artery disease   . Diabetes mellitus without complication ([place])   . Hypertension   . Renal disorder     Past Surgical History:  Procedure Laterality Date  . AV FISTULA PLACEMENT Left 07/22/2016   Procedure: INSERTION OF ARTERIOVENOUS (AV) LOOP GRAFT LEFT THIGH;  Surgeon: Waynetta Sandy, MD;  Location: [facility];  Service: Vascular;  Laterality: Left;  . INSERTION OF DIALYSIS CATHETER    . INSERTION OF DIALYSIS CATHETER N/A 05/29/2015   Procedure: INSERTION OF DIALYSIS CATHETER;  Surgeon: Angelia Mould, MD;  Location: [facility];  Service: Vascular;  Laterality: N/A;  . IR GENERIC HISTORICAL  07/16/2016   IR US GUIDE VASC ACCESS RIGHT 07/16/2016 Arne Cleveland, MD [facility]  . IR GENERIC HISTORICAL  07/16/2016   IR FLUORO GUIDE CV LINE RIGHT 07/16/2016 Arne Cleveland, MD  [facility]  . IR GENERIC HISTORICAL  07/16/2016   IR US GUIDE VASC ACCESS RIGHT 07/16/2016 Arne Cleveland, MD [facility]  . IR GENERIC HISTORICAL  07/16/2016   IR VENO/EXT/UNI RIGHT 07/16/2016 Arne Cleveland, MD [facility]  . IR GENERIC HISTORICAL  09/03/2016   IR US GUIDE VASC ACCESS LEFT 09/03/2016 Marybelle Killings, MD [facility]  . IR GENERIC HISTORICAL Left 09/03/2016   IR DIALY SHUNT INTRO NEEDLE/INTRACATH INITIAL W/IMG LEFT 09/03/2016 Marybelle Killings, MD [facility]  . LIGATION OF ARTERIOVENOUS  FISTULA Left 07/17/2016   Procedure: LIGATION OF BRACHIOCEPHALIC ARTERIOVENOUS  FISTULA  Left arm;  Surgeon: Rosetta Posner, MD;  Location: [facility];  Service: Vascular;  Laterality: Left;  . NO PAST SURGERIES    . PERIPHERAL VASCULAR CATHETERIZATION N/A 10/25/2015   Procedure: Dialysis/Perma Catheter Insertion;  Surgeon: Katha Cabal, MD;  Location: [facility];  Service: Cardiovascular;  Laterality: N/A;  . PERIPHERAL VASCULAR CATHETERIZATION Left 02/22/2016   Procedure: A/V Shuntogram/Fistulagram;  Surgeon: Algernon Huxley, MD;  Location: [facility];  Service: Cardiovascular;  Laterality: Left;  . PERIPHERAL VASCULAR CATHETERIZATION Right 03/19/2016   Procedure: Dialysis/Perma Catheter Insertion;  Surgeon: Katha Cabal, MD;  Location: [facility];  Service: Cardiovascular;  Laterality: Right;  . PERIPHERAL VASCULAR CATHETERIZATION N/A 04/22/2016   Procedure: Dialysis/Perma Catheter Removal;  Surgeon: Algernon Huxley, MD;  Location: [facility];  Service: Cardiovascular;  Laterality: N/A;    Patient Care Team: Theotis Burrow, MD as PCP - General (Family Medicine)  Social History   Social History  . Marital status: Legally Separated    Spouse name: N/A  . Number of children: N/A  . Years of education: N/A   Occupational History  . Not on file.   Social History Main Topics  . Smoking status: Never Smoker  . Smokeless tobacco: Never Used  .  Alcohol use No  . Drug use:     Types: Marijuana     Comment: Occasionally  . Sexual activity: Not on file   Other Topics Concern  . Not on file   Social History Narrative  . No narrative on file     reports that he has never smoked. He has never used smokeless tobacco. He reports that he uses drugs, including Marijuana. He reports that he does not drink alcohol.  Family History  Problem Relation Age of Onset  . Hypertension Other   . Diabetes Other    Family Status  Relation Status  . Mother Alive  . Father Deceased  . Other     Immunization History  Administered Date(s) Administered  . PPD Test 05/01/2016, 06/07/2016, 06/14/2016    Allergies  Allergen Reactions  . Penicillins Other (See Comments)    Reaction:  Unknown     Medications: Patient's Medications  New Prescriptions   No medications on file  Previous Medications   ACETAMINOPHEN (TYLENOL) 325 MG TABLET    Take 650 mg by mouth every 6 (six) hours as needed for mild pain, fever or headache.   AMIODARONE (PACERONE) 400 MG TABLET    Take 1 tablet (400 mg total) by mouth daily.   ASPIRIN 81 MG CHEWABLE TABLET    Chew 1 tablet (81 mg total) by mouth daily.   B COMPLEX-VITAMIN C-FOLIC ACID (NEPHRO-VITE) 0.8 MG TABS TABLET    Take 1 tablet by mouth 2 (two) times daily.   CALCITRIOL (ROCALTROL) 0.25 MCG CAPSULE    Take 0.25 mcg by mouth every morning.   CAPSAICIN (CAPSAGEL) 0.025 % GEL    Apply 1 application topically 2 (two) times daily as needed (for lower back pain).   IPRATROPIUM-ALBUTEROL (DUONEB) 0.5-2.5 (3) MG/3ML SOLN    Take 3 mLs by nebulization every 6 (six) hours as needed (for wheezing/shortness of breath).   LAMOTRIGINE (LAMICTAL) 25 MG TABLET    Take 2 tablets (50 mg total) by mouth daily.   LEVOTHYROXINE (SYNTHROID, LEVOTHROID) 100 MCG TABLET    Take 100 mcg by mouth daily before breakfast.   LORATADINE (CLARITIN) 10 MG TABLET    Take 1 tablet (10 mg total) by mouth daily.   MIRTAZAPINE (REMERON  SOL-TAB) 15 MG DISINTEGRATING TABLET    Take 1 tablet (15 mg total) by mouth at bedtime.   POLYETHYLENE GLYCOL (MIRALAX / GLYCOLAX) PACKET    Take 17 g by mouth daily as needed for mild constipation.    SACCHAROMYCES BOULARDII (FLORASTOR) 250 MG CAPSULE    Take 250 mg by mouth 2 (two) times daily.   SUCROFERRIC OXYHYDROXIDE (VELPHORO) 500 MG CHEWABLE TABLET    Chew 500-1,000 mg by mouth See admin instructions. Give 1082m with meals, May also give 500 mg by mouth as needed with snacks.   TRAVOPROST, BAK FREE, (TRAVATAN) 0.004 % SOLN OPHTHALMIC SOLUTION    Place 1 drop into both eyes at bedtime.   WARFARIN (COUMADIN) 7.5 MG TABLET    Take 7.5  mg by mouth daily.  Modified Medications   No medications on file  Discontinued Medications   CALCITRIOL (ROCALTROL) 0.5 MCG CAPSULE    Take 1 capsule (0.5 mcg total) by mouth every other day.    Review of Systems  Vitals:   09/05/16 0856  BP: 110/84  Pulse: 86  Resp: 20  Temp: 97.4 F (36.3 C)  TempSrc: Oral  SpO2: 95%  Weight: 196 lb (88.9 kg)  Height: 5' 7" (1.702 m)   Body mass index is 30.7 kg/m.  Physical Exam   Labs reviewed: Admission on 08/29/2016, Discharged on 09/04/2016  No results displayed because visit has over 200 results.    Admission on 07/16/2016, Discharged on 07/23/2016  Component Date Value Ref Range Status  . Sodium 07/16/2016 138  135 - 145 mmol/L Final  . Potassium 07/16/2016 5.7* 3.5 - 5.1 mmol/L Final  . Chloride 07/16/2016 96* 101 - 111 mmol/L Final  . CO2 07/16/2016 22  22 - 32 mmol/L Final  . Glucose, Bld 07/16/2016 110* 65 - 99 mg/dL Final  . BUN 07/16/2016 82* 6 - 20 mg/dL Final  . Creatinine, Ser 07/16/2016 17.91* 0.61 - 1.24 mg/dL Final  . Calcium 07/16/2016 8.3* 8.9 - 10.3 mg/dL Final  . Total Protein 07/16/2016 8.1  6.5 - 8.1 g/dL Final  . Albumin 07/16/2016 3.9  3.5 - 5.0 g/dL Final  . AST 07/16/2016 18  15 - 41 U/L Final  . ALT 07/16/2016 14* 17 - 63 U/L Final  . Alkaline Phosphatase  07/16/2016 109  38 - 126 U/L Final  . Total Bilirubin 07/16/2016 0.8  0.3 - 1.2 mg/dL Final  . GFR calc non Af Amer 07/16/2016 2* >60 mL/min Final  . GFR calc Af Amer 07/16/2016 3* >60 mL/min Final   Comment: (NOTE) The eGFR has been calculated using the CKD EPI equation. This calculation has not been validated in all clinical situations. eGFR's persistently <60 mL/min signify possible Chronic Kidney Disease.   . Anion gap 07/16/2016 20* 5 - 15 Final  . Lactic Acid, Venous 07/16/2016 2.23* 0.5 - 1.9 mmol/L Final  . Comment 07/16/2016 NOTIFIED PHYSICIAN   Final  . WBC 07/16/2016 8.2  4.0 - 10.5 K/uL Final  . RBC 07/16/2016 4.25  4.22 - 5.81 MIL/uL Final  . Hemoglobin 07/16/2016 11.9* 13.0 - 17.0 g/dL Final  . HCT 07/16/2016 37.3* 39.0 - 52.0 % Final  . MCV 07/16/2016 87.8  78.0 - 100.0 fL Final  . MCH 07/16/2016 28.0  26.0 - 34.0 pg Final  . MCHC 07/16/2016 31.9  30.0 - 36.0 g/dL Final  . RDW 07/16/2016 17.5* 11.5 - 15.5 % Final  . Platelets 07/16/2016 205  150 - 400 K/uL Final  . Neutrophils Relative % 07/16/2016 80  % Final  . Neutro Abs 07/16/2016 6.5  1.7 - 7.7 K/uL Final  . Lymphocytes Relative 07/16/2016 12  % Final  . Lymphs Abs 07/16/2016 1.0  0.7 - 4.0 K/uL Final  . Monocytes Relative 07/16/2016 6  % Final  . Monocytes Absolute 07/16/2016 0.5  0.1 - 1.0 K/uL Final  . Eosinophils Relative 07/16/2016 2  % Final  . Eosinophils Absolute 07/16/2016 0.1  0.0 - 0.7 K/uL Final  . Basophils Relative 07/16/2016 0  % Final  . Basophils Absolute 07/16/2016 0.0  0.0 - 0.1 K/uL Final  . Procalcitonin 07/17/2016 0.29  ng/mL Final   Comment:        Interpretation: PCT (Procalcitonin) <= 0.5 ng/mL: Systemic infection (sepsis) is  not likely. Local bacterial infection is possible. (NOTE)         ICU PCT Algorithm               Non ICU PCT Algorithm    ----------------------------     ------------------------------         PCT < 0.25 ng/mL                 PCT < 0.1 ng/mL     Stopping of  antibiotics            Stopping of antibiotics       strongly encouraged.               strongly encouraged.    ----------------------------     ------------------------------       PCT level decrease by               PCT < 0.25 ng/mL       >= 80% from peak PCT       OR PCT 0.25 - 0.5 ng/mL          Stopping of antibiotics                                             encouraged.     Stopping of antibiotics           encouraged.    ----------------------------     ------------------------------       PCT level decrease by              PCT >= 0.25 ng/mL       < 80% from peak PCT        AND PCT >= 0.5 ng/mL            Continuin                          g antibiotics                                              encouraged.       Continuing antibiotics            encouraged.    ----------------------------     ------------------------------     PCT level increase compared          PCT > 0.5 ng/mL         with peak PCT AND          PCT >= 0.5 ng/mL             Escalation of antibiotics                                          strongly encouraged.      Escalation of antibiotics        strongly encouraged.   Marland Kitchen MRSA by PCR 07/17/2016 NEGATIVE  NEGATIVE Final   Comment:        The GeneXpert MRSA Assay (FDA approved for NASAL specimens only), is one component of a comprehensive MRSA colonization surveillance program. It is not intended to diagnose  MRSA infection nor to guide or monitor treatment for MRSA infections.   . Heparin Unfractionated 07/17/2016 0.16* 0.30 - 0.70 IU/mL Final   Comment:        IF HEPARIN RESULTS ARE BELOW EXPECTED VALUES, AND PATIENT DOSAGE HAS BEEN CONFIRMED, SUGGEST FOLLOW UP TESTING OF ANTITHROMBIN III LEVELS.   . WBC 07/17/2016 7.1  4.0 - 10.5 K/uL Final  . RBC 07/17/2016 4.10* 4.22 - 5.81 MIL/uL Final  . Hemoglobin 07/17/2016 11.6* 13.0 - 17.0 g/dL Final  . HCT 07/17/2016 35.6* 39.0 - 52.0 % Final  . MCV 07/17/2016 86.8  78.0 - 100.0 fL Final  . MCH  07/17/2016 28.3  26.0 - 34.0 pg Final  . MCHC 07/17/2016 32.6  30.0 - 36.0 g/dL Final  . RDW 07/17/2016 17.2* 11.5 - 15.5 % Final  . Platelets 07/17/2016 189  150 - 400 K/uL Final  . aPTT 07/17/2016 38* 24 - 36 seconds Final   Comment:        IF BASELINE aPTT IS ELEVATED, SUGGEST PATIENT RISK ASSESSMENT BE USED TO DETERMINE APPROPRIATE ANTICOAGULANT THERAPY.   . Sodium 07/17/2016 136  135 - 145 mmol/L Final  . Potassium 07/17/2016 4.7  3.5 - 5.1 mmol/L Final  . Chloride 07/17/2016 95* 101 - 111 mmol/L Final  . CO2 07/17/2016 26  22 - 32 mmol/L Final  . Glucose, Bld 07/17/2016 81  65 - 99 mg/dL Final  . BUN 07/17/2016 37* 6 - 20 mg/dL Final  . Creatinine, Ser 07/17/2016 11.27* 0.61 - 1.24 mg/dL Final  . Calcium 07/17/2016 8.3* 8.9 - 10.3 mg/dL Final  . Phosphorus 07/17/2016 7.1* 2.5 - 4.6 mg/dL Final  . Albumin 07/17/2016 3.8  3.5 - 5.0 g/dL Final  . GFR calc non Af Amer 07/17/2016 4* >60 mL/min Final  . GFR calc Af Amer 07/17/2016 5* >60 mL/min Final   Comment: (NOTE) The eGFR has been calculated using the CKD EPI equation. This calculation has not been validated in all clinical situations. eGFR's persistently <60 mL/min signify possible Chronic Kidney Disease.   . Anion gap 07/17/2016 15  5 - 15 Final  . Prothrombin Time 07/17/2016 15.3* 11.4 - 15.2 seconds Final  . INR 07/17/2016 1.20   Final  . Glucose-Capillary 07/17/2016 86  65 - 99 mg/dL Final  . Glucose-Capillary 07/17/2016 119* 65 - 99 mg/dL Final  . Procalcitonin 07/18/2016 0.75  ng/mL Final   Comment:        Interpretation: PCT > 0.5 ng/mL and <= 2 ng/mL: Systemic infection (sepsis) is possible, but other conditions are known to elevate PCT as well. (NOTE)         ICU PCT Algorithm               Non ICU PCT Algorithm    ----------------------------     ------------------------------         PCT < 0.25 ng/mL                 PCT < 0.1 ng/mL     Stopping of antibiotics            Stopping of antibiotics        strongly encouraged.               strongly encouraged.    ----------------------------     ------------------------------       PCT level decrease by               PCT < 0.25 ng/mL       >=  80% from peak PCT       OR PCT 0.25 - 0.5 ng/mL          Stopping of antibiotics                                             encouraged.     Stopping of antibiotics           encouraged.    ----------------------------     ------------------------------       PCT level decrease by              PCT >= 0.25 ng/mL       < 80% from peak PCT        AND PCT >= 0.5 ng/mL                                      Continuing antibiotics                                              encouraged.       Continuing antibiotics            encouraged.    ----------------------------     ------------------------------     PCT level increase compared          PCT > 0.5 ng/mL         with peak PCT AND          PCT >= 0.5 ng/mL             Escalation of antibiotics                                          strongly encouraged.      Escalation of antibiotics        strongly encouraged.   . Heparin Unfractionated 07/18/2016 <0.10* 0.30 - 0.70 IU/mL Final   Comment:        IF HEPARIN RESULTS ARE BELOW EXPECTED VALUES, AND PATIENT DOSAGE HAS BEEN CONFIRMED, SUGGEST FOLLOW UP TESTING OF ANTITHROMBIN III LEVELS. REPEATED TO VERIFY   . aPTT 07/18/2016 29  24 - 36 seconds Final  . WBC 07/18/2016 5.7  4.0 - 10.5 K/uL Final  . RBC 07/18/2016 3.80* 4.22 - 5.81 MIL/uL Final  . Hemoglobin 07/18/2016 10.4* 13.0 - 17.0 g/dL Final  . HCT 07/18/2016 32.9* 39.0 - 52.0 % Final  . MCV 07/18/2016 86.6  78.0 - 100.0 fL Final  . MCH 07/18/2016 27.4  26.0 - 34.0 pg Final  . MCHC 07/18/2016 31.6  30.0 - 36.0 g/dL Final  . RDW 07/18/2016 17.2* 11.5 - 15.5 % Final  . Platelets 07/18/2016 171  150 - 400 K/uL Final  . Neutrophils Relative % 07/18/2016 73  % Final  . Neutro Abs 07/18/2016 4.2  1.7 - 7.7 K/uL Final  . Lymphocytes Relative  07/18/2016 8  % Final  . Lymphs Abs 07/18/2016 0.5* 0.7 - 4.0 K/uL Final  . Monocytes Relative 07/18/2016 13  % Final  . Monocytes Absolute 07/18/2016 0.7  0.1 - 1.0  K/uL Final  . Eosinophils Relative 07/18/2016 6  % Final  . Eosinophils Absolute 07/18/2016 0.3  0.0 - 0.7 K/uL Final  . Basophils Relative 07/18/2016 0  % Final  . Basophils Absolute 07/18/2016 0.0  0.0 - 0.1 K/uL Final  . Heparin Unfractionated 07/19/2016 <0.10* 0.30 - 0.70 IU/mL Final   Comment:        IF HEPARIN RESULTS ARE BELOW EXPECTED VALUES, AND PATIENT DOSAGE HAS BEEN CONFIRMED, SUGGEST FOLLOW UP TESTING OF ANTITHROMBIN III LEVELS. REPEATED TO VERIFY   . WBC 07/19/2016 6.4  4.0 - 10.5 K/uL Final  . RBC 07/19/2016 4.21* 4.22 - 5.81 MIL/uL Final  . Hemoglobin 07/19/2016 11.6* 13.0 - 17.0 g/dL Final  . HCT 07/19/2016 37.0* 39.0 - 52.0 % Final  . MCV 07/19/2016 87.9  78.0 - 100.0 fL Final  . MCH 07/19/2016 27.6  26.0 - 34.0 pg Final  . MCHC 07/19/2016 31.4  30.0 - 36.0 g/dL Final  . RDW 07/19/2016 17.0* 11.5 - 15.5 % Final  . Platelets 07/19/2016 170  150 - 400 K/uL Final  . aPTT 07/19/2016 30  24 - 36 seconds Final  . Procalcitonin 07/20/2016 0.55  ng/mL Final   Comment:        Interpretation: PCT > 0.5 ng/mL and <= 2 ng/mL: Systemic infection (sepsis) is possible, but other conditions are known to elevate PCT as well. (NOTE)         ICU PCT Algorithm               Non ICU PCT Algorithm    ----------------------------     ------------------------------         PCT < 0.25 ng/mL                 PCT < 0.1 ng/mL     Stopping of antibiotics            Stopping of antibiotics       strongly encouraged.               strongly encouraged.    ----------------------------     ------------------------------       PCT level decrease by               PCT < 0.25 ng/mL       >= 80% from peak PCT       OR PCT 0.25 - 0.5 ng/mL          Stopping of antibiotics                                             encouraged.      Stopping of antibiotics           encouraged.    ----------------------------     ------------------------------       PCT level decrease by              PCT >= 0.25 ng/mL       < 80% from peak PCT        AND PCT >= 0.5 ng/mL                                      Continuing antibiotics  encouraged.       Continuing antibiotics            encouraged.    ----------------------------     ------------------------------     PCT level increase compared          PCT > 0.5 ng/mL         with peak PCT AND          PCT >= 0.5 ng/mL             Escalation of antibiotics                                          strongly encouraged.      Escalation of antibiotics        strongly encouraged.   . WBC 07/20/2016 8.2  4.0 - 10.5 K/uL Final  . RBC 07/20/2016 4.06* 4.22 - 5.81 MIL/uL Final  . Hemoglobin 07/20/2016 11.4* 13.0 - 17.0 g/dL Final  . HCT 07/20/2016 35.0* 39.0 - 52.0 % Final  . MCV 07/20/2016 86.2  78.0 - 100.0 fL Final  . MCH 07/20/2016 28.1  26.0 - 34.0 pg Final  . MCHC 07/20/2016 32.6  30.0 - 36.0 g/dL Final  . RDW 07/20/2016 16.6* 11.5 - 15.5 % Final  . Platelets 07/20/2016 167  150 - 400 K/uL Final  . Neutrophils Relative % 07/20/2016 78  % Final  . Neutro Abs 07/20/2016 6.4  1.7 - 7.7 K/uL Final  . Lymphocytes Relative 07/20/2016 6  % Final  . Lymphs Abs 07/20/2016 0.5* 0.7 - 4.0 K/uL Final  . Monocytes Relative 07/20/2016 10  % Final  . Monocytes Absolute 07/20/2016 0.8  0.1 - 1.0 K/uL Final  . Eosinophils Relative 07/20/2016 6  % Final  . Eosinophils Absolute 07/20/2016 0.5  0.0 - 0.7 K/uL Final  . Basophils Relative 07/20/2016 0  % Final  . Basophils Absolute 07/20/2016 0.0  0.0 - 0.1 K/uL Final  . Sodium 07/20/2016 130* 135 - 145 mmol/L Final  . Potassium 07/20/2016 3.2* 3.5 - 5.1 mmol/L Final  . Chloride 07/20/2016 92* 101 - 111 mmol/L Final  . CO2 07/20/2016 25  22 - 32 mmol/L Final  . Glucose, Bld 07/20/2016 191* 65 - 99 mg/dL  Final  . BUN 07/20/2016 14  6 - 20 mg/dL Final  . Creatinine, Ser 07/20/2016 5.17* 0.61 - 1.24 mg/dL Final  . Calcium 07/20/2016 8.5* 8.9 - 10.3 mg/dL Final  . GFR calc non Af Amer 07/20/2016 11* >60 mL/min Final  . GFR calc Af Amer 07/20/2016 12* >60 mL/min Final   Comment: (NOTE) The eGFR has been calculated using the CKD EPI equation. This calculation has not been validated in all clinical situations. eGFR's persistently <60 mL/min signify possible Chronic Kidney Disease.   . Anion gap 07/20/2016 13  5 - 15 Final  . Heparin Unfractionated 07/19/2016 0.27* 0.30 - 0.70 IU/mL Final   Comment:        IF HEPARIN RESULTS ARE BELOW EXPECTED VALUES, AND PATIENT DOSAGE HAS BEEN CONFIRMED, SUGGEST FOLLOW UP TESTING OF ANTITHROMBIN III LEVELS.   Marland Kitchen Heparin Unfractionated 07/20/2016 1.88* 0.30 - 0.70 IU/mL Final   Comment:        IF HEPARIN RESULTS ARE BELOW EXPECTED VALUES, AND PATIENT DOSAGE HAS BEEN CONFIRMED, SUGGEST FOLLOW UP TESTING OF ANTITHROMBIN III LEVELS. RESULTS CONFIRMED BY MANUAL DILUTION   . Sodium 07/20/2016  130* 135 - 145 mmol/L Final  . Potassium 07/20/2016 3.2* 3.5 - 5.1 mmol/L Final  . Chloride 07/20/2016 93* 101 - 111 mmol/L Final  . CO2 07/20/2016 25  22 - 32 mmol/L Final  . Glucose, Bld 07/20/2016 189* 65 - 99 mg/dL Final  . BUN 07/20/2016 14  6 - 20 mg/dL Final  . Creatinine, Ser 07/20/2016 5.28* 0.61 - 1.24 mg/dL Final  . Calcium 07/20/2016 8.6* 8.9 - 10.3 mg/dL Final  . Phosphorus 07/20/2016 3.6  2.5 - 4.6 mg/dL Final  . Albumin 07/20/2016 3.5  3.5 - 5.0 g/dL Final  . GFR calc non Af Amer 07/20/2016 10* >60 mL/min Final  . GFR calc Af Amer 07/20/2016 12* >60 mL/min Final   Comment: (NOTE) The eGFR has been calculated using the CKD EPI equation. This calculation has not been validated in all clinical situations. eGFR's persistently <60 mL/min signify possible Chronic Kidney Disease.   . Anion gap 07/20/2016 12  5 - 15 Final  . Heparin Unfractionated  07/20/2016 0.52  0.30 - 0.70 IU/mL Final   Comment:        IF HEPARIN RESULTS ARE BELOW EXPECTED VALUES, AND PATIENT DOSAGE HAS BEEN CONFIRMED, SUGGEST FOLLOW UP TESTING OF ANTITHROMBIN III LEVELS.   Marland Kitchen Heparin Unfractionated 07/20/2016 0.87* 0.30 - 0.70 IU/mL Final   Comment:        IF HEPARIN RESULTS ARE BELOW EXPECTED VALUES, AND PATIENT DOSAGE HAS BEEN CONFIRMED, SUGGEST FOLLOW UP TESTING OF ANTITHROMBIN III LEVELS.   Marland Kitchen Prothrombin Time 07/22/2016 14.4  11.4 - 15.2 seconds Final  . INR 07/22/2016 1.11   Final  . Sodium 07/22/2016 134* 135 - 145 mmol/L Final  . Potassium 07/22/2016 4.7  3.5 - 5.1 mmol/L Final  . Chloride 07/22/2016 95* 101 - 111 mmol/L Final  . CO2 07/22/2016 24  22 - 32 mmol/L Final  . Glucose, Bld 07/22/2016 96  65 - 99 mg/dL Final  . BUN 07/22/2016 49* 6 - 20 mg/dL Final  . Creatinine, Ser 07/22/2016 10.21* 0.61 - 1.24 mg/dL Final  . Calcium 07/22/2016 9.3  8.9 - 10.3 mg/dL Final  . Phosphorus 07/22/2016 5.8* 2.5 - 4.6 mg/dL Final  . Albumin 07/22/2016 3.6  3.5 - 5.0 g/dL Final  . GFR calc non Af Amer 07/22/2016 5* >60 mL/min Final  . GFR calc Af Amer 07/22/2016 5* >60 mL/min Final   Comment: (NOTE) The eGFR has been calculated using the CKD EPI equation. This calculation has not been validated in all clinical situations. eGFR's persistently <60 mL/min signify possible Chronic Kidney Disease.   . Anion gap 07/22/2016 15  5 - 15 Final  . WBC 07/22/2016 9.6  4.0 - 10.5 K/uL Final  . RBC 07/22/2016 4.23  4.22 - 5.81 MIL/uL Final  . Hemoglobin 07/22/2016 12.0* 13.0 - 17.0 g/dL Final  . HCT 07/22/2016 36.4* 39.0 - 52.0 % Final  . MCV 07/22/2016 86.1  78.0 - 100.0 fL Final  . MCH 07/22/2016 28.4  26.0 - 34.0 pg Final  . MCHC 07/22/2016 33.0  30.0 - 36.0 g/dL Final  . RDW 07/22/2016 16.9* 11.5 - 15.5 % Final  . Platelets 07/22/2016 201  150 - 400 K/uL Final  . Neutrophils Relative % 07/22/2016 73  % Final  . Neutro Abs 07/22/2016 7.0  1.7 - 7.7 K/uL Final   . Lymphocytes Relative 07/22/2016 12  % Final  . Lymphs Abs 07/22/2016 1.1  0.7 - 4.0 K/uL Final  . Monocytes Relative 07/22/2016 9  %  Final  . Monocytes Absolute 07/22/2016 0.9  0.1 - 1.0 K/uL Final  . Eosinophils Relative 07/22/2016 6  % Final  . Eosinophils Absolute 07/22/2016 0.6  0.0 - 0.7 K/uL Final  . Basophils Relative 07/22/2016 0  % Final  . Basophils Absolute 07/22/2016 0.0  0.0 - 0.1 K/uL Final  . Glucose-Capillary 07/22/2016 111* 65 - 99 mg/dL Final  . Comment 1 07/22/2016 Notify RN   Final  . Comment 2 07/22/2016 Document in Chart   Final  . Prothrombin Time 07/23/2016 15.3* 11.4 - 15.2 seconds Final  . INR 07/23/2016 1.20   Final  Nursing Home on 06/20/2016  Component Date Value Ref Range Status  . Hemoglobin 06/12/2016 10.9* 13.5 - 17.5 g/dL Final  . HCT 06/12/2016 34* 41 - 53 % Final  . Platelets 06/12/2016 184  150 - 399 K/L Final  . WBC 06/12/2016 6.3  10^3/mL Final  . Glucose 06/12/2016 82  mg/dL Final  . BUN 06/12/2016 60* 4 - 21 mg/dL Final  . Creatinine 06/12/2016 15.0* 0.6 - 1.3 mg/dL Final  . Potassium 06/12/2016 5.4* 3.4 - 5.3 mmol/L Final  . Sodium 06/12/2016 142  137 - 147 mmol/L Final  . Alkaline Phosphatase 06/12/2016 113  25 - 125 U/L Final  . ALT 06/12/2016 16  10 - 40 U/L Final  . AST 06/12/2016 15  14 - 40 U/L Final  . Bilirubin, Total 06/12/2016 0.6  mg/dL Final  . TSH 06/12/2016 16.62* 0.41 - 5.90 uIU/mL Final  Admission on 06/13/2016, Discharged on 06/13/2016  Component Date Value Ref Range Status  . Sodium 06/13/2016 138  135 - 145 mmol/L Final  . Potassium 06/13/2016 5.8* 3.5 - 5.1 mmol/L Final  . Chloride 06/13/2016 99* 101 - 111 mmol/L Final  . BUN 06/13/2016 84* 6 - 20 mg/dL Final  . Creatinine, Ser 06/13/2016 >18.00* 0.61 - 1.24 mg/dL Final  . Glucose, Bld 06/13/2016 83  65 - 99 mg/dL Final  . Calcium, Ion 06/13/2016 0.74* 1.15 - 1.40 mmol/L Final  . TCO2 06/13/2016 27  0 - 100 mmol/L Final  . Hemoglobin 06/13/2016 12.2* 13.0 - 17.0  g/dL Final  . HCT 06/13/2016 36.0* 39.0 - 52.0 % Final  . Comment 06/13/2016 NOTIFIED PHYSICIAN   Final    Ir US Guide Vasc Access Left  Result Date: 09/03/2016 INDICATION: Possible thrombosed left thigh AV graft EXAM: IR DAILY SHUNT INTRO NEEDLE/INTRACATH INITIAL W/IMG LEFT; IR ULTRASOUND GUIDANCE VASC ACCESS LEFT MEDICATIONS: Versus at 0.5 mg.  Fentanyl 25 mcg Contrast 40 cc Isovue-300 ANESTHESIA/SEDATION: Moderate Sedation Time:  15 The patient was continuously monitored during the procedure by the interventional radiology nurse under my direct supervision. FLUOROSCOPY TIME:  Fluoroscopy Time:  minutes 54 seconds (21 mGy). COMPLICATIONS: None immediate. PROCEDURE: Informed written consent was obtained from the patient after a thorough discussion of the procedural risks, benefits and alternatives. All questions were addressed. Maximal Sterile Barrier Technique was utilized including caps, mask, sterile gowns, sterile gloves, sterile drape, hand hygiene and skin antiseptic. A timeout was performed prior to the initiation of the procedure. 1% lidocaine was utilized for local anesthesia. Under sonographic guidance, a micropuncture needle was inserted into the AV graft directed towards the venous anastomosis and removed over a 018 wire. A 5 French transitional dilator was inserted. The identical procedure was performed towards the arterial anastomosis. Blood flow was noted from the sheath. Contrast was then injected for imaging. The dilators were removed and hemostasis was achieved with 3-0 Ethilon figure 8  knots. FINDINGS: The graft, venous anastomosis, and central venous structures are all widely patent. The arterial anastomosis was not clearly visualized. IMPRESSION: The arterial anastomosis was not clearly imaged, however the remainder of the AV graft and central venous structures are widely patent. The graft is not thrombosed. It is ready for use. ACCESS: This access remains amenable to future  percutaneous interventions as clinically indicated. Electronically Signed   By: Marybelle Killings M.D.   On: 09/03/2016 15:44   Dg Chest Port 1 View  Result Date: 08/29/2016 CLINICAL DATA:  Respiratory failure EXAM: PORTABLE CHEST 1 VIEW COMPARISON:  08/29/2016 at 06:14 FINDINGS: Endotracheal tube is 5 cm above the carina. Nasogastric tube extends into the stomach and beyond the inferior edge of the image. Central airspace opacities persist bilaterally without significant interval change. IMPRESSION: Support equipment appears satisfactorily positioned. No significant interval change in the bilateral airspace opacities. Electronically Signed   By: Andreas Newport M.D.   On: 08/29/2016 07:01   Dg Chest Port 1 View  Result Date: 08/29/2016 CLINICAL DATA:  Dyspnea. EXAM: PORTABLE CHEST 1 VIEW COMPARISON:  07/17/2016 FINDINGS: Vascular and interstitial congestive changes. Central airspace opacity. Probable small effusions. Marked cardiomegaly. IMPRESSION: The findings likely represent congestive heart failure. Electronically Signed   By: Andreas Newport M.D.   On: 08/29/2016 06:27   Ir Dialy Shunt Intro Needle/intracath Initial W/img Left  Result Date: 09/03/2016 INDICATION: Possible thrombosed left thigh AV graft EXAM: IR DAILY SHUNT INTRO NEEDLE/INTRACATH INITIAL W/IMG LEFT; IR ULTRASOUND GUIDANCE VASC ACCESS LEFT MEDICATIONS: Versus at 0.5 mg.  Fentanyl 25 mcg Contrast 40 cc Isovue-300 ANESTHESIA/SEDATION: Moderate Sedation Time:  15 The patient was continuously monitored during the procedure by the interventional radiology nurse under my direct supervision. FLUOROSCOPY TIME:  Fluoroscopy Time:  minutes 54 seconds (21 mGy). COMPLICATIONS: None immediate. PROCEDURE: Informed written consent was obtained from the patient after a thorough discussion of the procedural risks, benefits and alternatives. All questions were addressed. Maximal Sterile Barrier Technique was utilized including caps, mask, sterile  gowns, sterile gloves, sterile drape, hand hygiene and skin antiseptic. A timeout was performed prior to the initiation of the procedure. 1% lidocaine was utilized for local anesthesia. Under sonographic guidance, a micropuncture needle was inserted into the AV graft directed towards the venous anastomosis and removed over a 018 wire. A 5 French transitional dilator was inserted. The identical procedure was performed towards the arterial anastomosis. Blood flow was noted from the sheath. Contrast was then injected for imaging. The dilators were removed and hemostasis was achieved with 3-0 Ethilon figure 8 knots. FINDINGS: The graft, venous anastomosis, and central venous structures are all widely patent. The arterial anastomosis was not clearly visualized. IMPRESSION: The arterial anastomosis was not clearly imaged, however the remainder of the AV graft and central venous structures are widely patent. The graft is not thrombosed. It is ready for use. ACCESS: This access remains amenable to future percutaneous interventions as clinically indicated. Electronically Signed   By: Marybelle Killings M.D.   On: 09/03/2016 15:44     Assessment/Plan    Monica S. Perlie Gold  [facility] [place], [place] [address] [phone] Cell (Monday-Friday 8 AM - 5 PM) [phone] After 5 PM and follow prompts  This encounter was created in error - please disregard.

## 2016-09-09 LAB — TSH: TSH: 6.84 u[IU]/mL — AB (ref <=5.90)

## 2016-09-13 ENCOUNTER — Non-Acute Institutional Stay (SKILLED_NURSING_FACILITY): Payer: Medicare Other | Admitting: Adult Health

## 2016-09-13 ENCOUNTER — Encounter: Payer: Self-pay | Admitting: Adult Health

## 2016-09-13 DIAGNOSIS — J0110 Acute frontal sinusitis, unspecified: Secondary | ICD-10-CM

## 2016-09-13 NOTE — Progress Notes (Signed)
Location:   [facility]   Place of Service:  SNF (31)   CODE STATUS: full code   Allergies  Allergen Reactions  . Penicillins Other (See Comments)    Reaction:  Unknown     Chief Complaint  Patient presents with  . Acute Visit    sinus infection     HPI:  Staff is concerned that he has a sinus infection. He has foul green sinus drainage and his face is swollen. He tells me that he is not feeling good. His head is painful and he has bad sinus drainage. He denies cough or sore throat. There are no reports of fever present.    Past Medical History:  Diagnosis Date  . Arthritis   . Asthma   . Cancer ([place])   . COPD (chronic obstructive pulmonary disease) ([place])   . Coronary artery disease   . Diabetes mellitus without complication ([place])   . Hypertension   . Renal disorder     Past Surgical History:  Procedure Laterality Date  . AV FISTULA PLACEMENT Left 07/22/2016   Procedure: INSERTION OF ARTERIOVENOUS (AV) LOOP GRAFT LEFT THIGH;  Surgeon: Waynetta Sandy, MD;  Location: [facility];  Service: Vascular;  Laterality: Left;  . INSERTION OF DIALYSIS CATHETER    . INSERTION OF DIALYSIS CATHETER N/A 05/29/2015   Procedure: INSERTION OF DIALYSIS CATHETER;  Surgeon: Angelia Mould, MD;  Location: [facility];  Service: Vascular;  Laterality: N/A;  . IR GENERIC HISTORICAL  07/16/2016   IR US GUIDE VASC ACCESS RIGHT 07/16/2016 Arne [place], MD [facility]  . IR GENERIC HISTORICAL  07/16/2016   IR FLUORO GUIDE CV LINE RIGHT 07/16/2016 Arne [place], MD [facility]  . IR GENERIC HISTORICAL  07/16/2016   IR US GUIDE VASC ACCESS RIGHT 07/16/2016 Arne [place], MD [facility]  . IR GENERIC HISTORICAL  07/16/2016   IR VENO/EXT/UNI RIGHT 07/16/2016 Arne [place], MD [facility]  . IR GENERIC HISTORICAL  09/03/2016   IR US GUIDE VASC ACCESS LEFT 09/03/2016 Marybelle Killings, MD [facility]  . IR GENERIC HISTORICAL Left 09/03/2016   IR DIALY SHUNT INTRO NEEDLE/INTRACATH  INITIAL W/IMG LEFT 09/03/2016 Marybelle Killings, MD [facility]  . LIGATION OF ARTERIOVENOUS  FISTULA Left 07/17/2016   Procedure: LIGATION OF BRACHIOCEPHALIC ARTERIOVENOUS  FISTULA  Left arm;  Surgeon: Rosetta Posner, MD;  Location: [facility];  Service: Vascular;  Laterality: Left;  . NO PAST SURGERIES    . PERIPHERAL VASCULAR CATHETERIZATION N/A 10/25/2015   Procedure: Dialysis/Perma Catheter Insertion;  Surgeon: Katha Cabal, MD;  Location: [facility];  Service: Cardiovascular;  Laterality: N/A;  . PERIPHERAL VASCULAR CATHETERIZATION Left 02/22/2016   Procedure: A/V Shuntogram/Fistulagram;  Surgeon: Algernon Huxley, MD;  Location: [facility];  Service: Cardiovascular;  Laterality: Left;  . PERIPHERAL VASCULAR CATHETERIZATION Right 03/19/2016   Procedure: Dialysis/Perma Catheter Insertion;  Surgeon: Katha Cabal, MD;  Location: [facility];  Service: Cardiovascular;  Laterality: Right;  . PERIPHERAL VASCULAR CATHETERIZATION N/A 04/22/2016   Procedure: Dialysis/Perma Catheter Removal;  Surgeon: Algernon Huxley, MD;  Location: [facility];  Service: Cardiovascular;  Laterality: N/A;    Social History   Social History  . Marital status: Legally Separated    Spouse name: N/A  . Number of children: N/A  . Years of education: N/A   Occupational History  . Not on file.   Social History Main Topics  . Smoking status: Never Smoker  .  Smokeless tobacco: Never Used  . Alcohol use No  . Drug use:     Types: Marijuana     Comment: Occasionally  . Sexual activity: Not on file   Other Topics Concern  . Not on file   Social History Narrative  . No narrative on file   Family History  Problem Relation Age of Onset  . Hypertension Other   . Diabetes Other       VITAL SIGNS BP 126/76   Pulse 70   Temp 98.7 F (37.1 C)   Resp 18   Ht 5\' 7"  (1.702 m)   Wt 182 lb 12.8 oz (82.9 kg)   SpO2 96%   BMI 28.63 kg/m   Patient's Medications  New Prescriptions    No medications on file  Previous Medications   ACETAMINOPHEN (TYLENOL) 325 MG TABLET    Take 650 mg by mouth every 6 (six) hours as needed for mild pain, fever or headache.   AMIODARONE (PACERONE) 400 MG TABLET    Take 1 tablet (400 mg total) by mouth daily.   ASPIRIN 81 MG CHEWABLE TABLET    Chew 1 tablet (81 mg total) by mouth daily.   B COMPLEX-VITAMIN C-FOLIC ACID (NEPHRO-VITE) 0.8 MG TABS TABLET    Take 1 tablet by mouth 2 (two) times daily.   CALCITRIOL (ROCALTROL) 0.25 MCG CAPSULE    Take 0.25 mcg by mouth every morning.   CAPSAICIN (CAPSAGEL) 0.025 % GEL    Apply 1 application topically 2 (two) times daily as needed (for lower back pain).   IPRATROPIUM-ALBUTEROL (DUONEB) 0.5-2.5 (3) MG/3ML SOLN    Take 3 mLs by nebulization every 6 (six) hours as needed (for wheezing/shortness of breath).   LAMOTRIGINE (LAMICTAL) 25 MG TABLET    Take 2 tablets (50 mg total) by mouth daily.   LEVOTHYROXINE (SYNTHROID, LEVOTHROID) 100 MCG TABLET    Take 100 mcg by mouth daily before breakfast.   LORATADINE (CLARITIN) 10 MG TABLET    Take 1 tablet (10 mg total) by mouth daily.   MIRTAZAPINE (REMERON SOL-TAB) 15 MG DISINTEGRATING TABLET    Take 1 tablet (15 mg total) by mouth at bedtime.   POLYETHYLENE GLYCOL (MIRALAX / GLYCOLAX) PACKET    Take 17 g by mouth daily as needed for mild constipation.    SACCHAROMYCES BOULARDII (FLORASTOR) 250 MG CAPSULE    Take 250 mg by mouth 2 (two) times daily.   SUCROFERRIC OXYHYDROXIDE (VELPHORO) 500 MG CHEWABLE TABLET    Chew 500-1,000 mg by mouth See admin instructions. Give 1000mg  with meals, May also give 500 mg by mouth as needed with snacks.   TRAVOPROST, BAK FREE, (TRAVATAN) 0.004 % SOLN OPHTHALMIC SOLUTION    Place 1 drop into both eyes at bedtime.   WARFARIN (COUMADIN) 7.5 MG TABLET    Take 7.5 mg by mouth daily.  Modified Medications   No medications on file  Discontinued Medications   No medications on file     SIGNIFICANT DIAGNOSTIC EXAMS   05-31-15: ct of  chest: 1. 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 2. Soft tissue swelling in the LEFT chest wall likely related to venous occlusion. There is prominent adenopathy in LEFT axilla and sub pectoralis location. Presumably this relates to venous occlusion. Cannot exclude a malignant process but is felt less likely. The adenopathy and swelling is new from 08/15/2014 3. Central venous line from a inferior approach with tip is in the RIGHT atrium and distal SVC. 4. Diffuse ground-glass  opacities not changed from prior. 5. Resolution of LEFT upper lobe pulmonary nodule.  Ureteral   06-02-15: left upper arm shuntogram: Status post left upper extremity fistulagram demonstrating occluded stent in the subclavian vein at the level of the left clavicular head, as a cause for increased venous pressure within the left upper extremity.  12-05-15: 2-d echo: - Left ventricle: The cavity size was normal. There was mild concentric hypertrophy. Systolic function was normal. The estimated ejection fraction was in the range of 50% to 55%. Regional wall motion abnormalities cannot be excluded. The study is not technically sufficient to allow evaluation of LV diastolic function. - Aortic valve: There was mild regurgitation. - Mitral valve: There was mild regurgitation. - Left atrium: The atrium was mildly to moderately dilated. - Right ventricle: Systolic function was mildly reduced. - Pulmonary arteries: Systolic pressure was mild to moderately elevated. - Inferior vena cava: The vessel was normal in size.   07-16-16: chest x-ray: Mild pulmonary edema  07-17-16: chest x-ray: No acute abnormalities. Pulmonary edema seen on previous exam improved.   LABS REVIEWED:   05-03-16: wbc 6.9; hgb 10.3; hct 33.2; mcv 90.7 plt 227; glucose 140; bun 98.3; creat 15.76; k+ 5.0; na++ 141; liver normal albumin 3.9; chol 101; ldl 35; trig 77; hdl 51; tsh 16.21; mag 2.4; phos 7.0  06-12-16: wbc 6.3; hgb 10.9; hct 33.6; mcv 88.4;  plt 184; glucose 82; bun 59.6; creat 15.00; k+ 5.4; na++ 142; liver normal albumin 4.3 TSH 16.62 07-16-16: wbc 8.2; hgb 11.9; hct 37.3; mcv 87.8; plt 205; glucose 110; bun 82; creat 17.91; k+ 5.7; na++ 138; liver normal albumin 3.9 07-22-16: wbc 9.6; hgb 12.0; hct 36.4 ;mcv 86.1; plt 201; glucose 96; bun 49;creat 10.21; k+ 4.7; na++ 134; phos 5.8; albumin 3.6  07-30-16: tsh 11.77    Review of Systems Has sinus congestion; pain; and drainage present  Constitutional: Negative for appetite change and fatigue.  Respiratory: Negative for cough, chest tightness and shortness of breath.   Cardiovascular: Negative for chest pain and leg swelling.  Gastrointestinal: Negative for abdominal pain and constipation.  Musculoskeletal: no complaint of pain  Skin: Negative for pallor.  Psychiatric/Behavioral: The patient is not nervous/anxious.     Physical Exam Vitals reviewed. Constitutional: No distress.  Overweight   Eyes: Conjunctivae are normal.  Has facial swelling present Has thick foul green drainage No lymph node enlargement  Neck: Neck supple. No JVD present. No thyromegaly present.  Cardiovascular: Normal rate, regular rhythm and intact distal pulses.   Respiratory: has few scattered wheezes .  GI: Soft. Bowel sounds are normal. He exhibits no distension. There is no tenderness.  Musculoskeletal: He exhibits no edema.  Able to move all extremities   Lymphadenopathy:    He has no cervical adenopathy.  Neurological: He is alert.  Skin: Skin is warm and dry. He is not diaphoretic.  Left thigh A/V graft placed 07-22-16.   Psychiatric: He has a normal mood and affect.    ASSESSMENT/ PLAN:  1. Acute sinusitis: will begin doxycycline 100 mg twice daily for 2 weeks with florastor twice daiy will being flonase nightly and will begin mucinex twice daily for one week.   MD is aware of resident's narcotic use and is in agreement with current plan of care. We will attempt to wean resident  as apropriate   Ok Edwards NP [facility]  Contact [phone] Monday through Friday 8am- 5pm  After hours call [phone]

## 2016-09-20 ENCOUNTER — Non-Acute Institutional Stay (SKILLED_NURSING_FACILITY): Payer: Medicare Other | Admitting: Adult Health

## 2016-09-20 ENCOUNTER — Encounter: Payer: Self-pay | Admitting: Adult Health

## 2016-09-20 ENCOUNTER — Other Ambulatory Visit: Payer: Self-pay | Admitting: *Deleted

## 2016-09-20 DIAGNOSIS — E032 Hypothyroidism due to medicaments and other exogenous substances: Secondary | ICD-10-CM | POA: Diagnosis not present

## 2016-09-20 DIAGNOSIS — T462X1A Poisoning by other antidysrhythmic drugs, accidental (unintentional), initial encounter: Secondary | ICD-10-CM

## 2016-09-20 NOTE — Progress Notes (Signed)
Location:   [facility] Room Number: 215 B Place of Service:  SNF (31)   CODE STATUS: DNR  Allergies  Allergen Reactions  . Penicillins Other (See Comments)    Reaction:  Unknown     Chief Complaint  Patient presents with  . Acute Visit    Elevated thyroid results    HPI:  His tsh is 6.84; with a free T4 1.27. There are no reports of missed doses of synthroid.   Past Medical History:  Diagnosis Date  . Arthritis   . Asthma   . Cancer ([place])   . COPD (chronic obstructive pulmonary disease) ([place])   . Coronary artery disease   . Diabetes mellitus without complication ([place])   . Hypertension   . Renal disorder     Past Surgical History:  Procedure Laterality Date  . AV FISTULA PLACEMENT Left 07/22/2016   Procedure: INSERTION OF ARTERIOVENOUS (AV) LOOP GRAFT LEFT THIGH;  Surgeon: Waynetta Sandy, MD;  Location: [place];  Service: Vascular;  Laterality: Left;  . INSERTION OF DIALYSIS CATHETER    . INSERTION OF DIALYSIS CATHETER N/A 05/29/2015   Procedure: INSERTION OF DIALYSIS CATHETER;  Surgeon: Angelia Mould, MD;  Location: [facility];  Service: Vascular;  Laterality: N/A;  . IR GENERIC HISTORICAL  07/16/2016   IR US GUIDE VASC ACCESS RIGHT 07/16/2016 Arne Cleveland, MD [facility]  . IR GENERIC HISTORICAL  07/16/2016   IR FLUORO GUIDE CV LINE RIGHT 07/16/2016 Arne Cleveland, MD [facility]  . IR GENERIC HISTORICAL  07/16/2016   IR US GUIDE VASC ACCESS RIGHT 07/16/2016 Arne Cleveland, MD [facility]  . IR GENERIC HISTORICAL  07/16/2016   IR VENO/EXT/UNI RIGHT 07/16/2016 Arne Cleveland, MD [facility]  . IR GENERIC HISTORICAL  09/03/2016   IR US GUIDE VASC ACCESS LEFT 09/03/2016 Marybelle Killings, MD [facility]  . IR GENERIC HISTORICAL Left 09/03/2016   IR DIALY SHUNT INTRO NEEDLE/INTRACATH INITIAL W/IMG LEFT 09/03/2016 Marybelle Killings, MD [facility]  . LIGATION OF ARTERIOVENOUS  FISTULA Left 07/17/2016   Procedure: LIGATION OF BRACHIOCEPHALIC  ARTERIOVENOUS  FISTULA  Left arm;  Surgeon: Rosetta Posner, MD;  Location: [place];  Service: Vascular;  Laterality: Left;  . NO PAST SURGERIES    . PERIPHERAL VASCULAR CATHETERIZATION N/A 10/25/2015   Procedure: Dialysis/Perma Catheter Insertion;  Surgeon: Katha Cabal, MD;  Location: [place] [facility];  Service: Cardiovascular;  Laterality: N/A;  . PERIPHERAL VASCULAR CATHETERIZATION Left 02/22/2016   Procedure: A/V Shuntogram/Fistulagram;  Surgeon: Algernon Huxley, MD;  Location: [place] [facility];  Service: Cardiovascular;  Laterality: Left;  . PERIPHERAL VASCULAR CATHETERIZATION Right 03/19/2016   Procedure: Dialysis/Perma Catheter Insertion;  Surgeon: Katha Cabal, MD;  Location: [place] [facility];  Service: Cardiovascular;  Laterality: Right;  . PERIPHERAL VASCULAR CATHETERIZATION N/A 04/22/2016   Procedure: Dialysis/Perma Catheter Removal;  Surgeon: Algernon Huxley, MD;  Location: [place] [facility];  Service: Cardiovascular;  Laterality: N/A;    Social History   Social History  . Marital status: Legally Separated    Spouse name: N/A  . Number of children: N/A  . Years of education: N/A   Occupational History  . Not on file.   Social History Main Topics  . Smoking status: Never Smoker  . Smokeless tobacco: Never Used  . Alcohol use No  . Drug use:     Types: Marijuana     Comment: Occasionally  . Sexual activity: Not on file  Other Topics Concern  . Not on file   Social History Narrative  . No narrative on file   Family History  Problem Relation Age of Onset  . Hypertension Other   . Diabetes Other       VITAL SIGNS BP 126/76   Pulse 70   Temp 98.7 F (37.1 C)   Resp 18   Ht 5\' 7"  (1.702 m)   Wt 182 lb 12.8 oz (82.9 kg)   SpO2 96%   BMI 28.63 kg/m   Patient's Medications  New Prescriptions   No medications on file  Previous Medications   ACETAMINOPHEN (TYLENOL) 325 MG TABLET    Take 650 mg by mouth every 6 (six) hours as needed for mild pain,  fever or headache.   AMIODARONE (PACERONE) 400 MG TABLET    Take 1 tablet (400 mg total) by mouth daily.   ASPIRIN 81 MG CHEWABLE TABLET    Chew 1 tablet (81 mg total) by mouth daily.   B COMPLEX-VITAMIN C-FOLIC ACID (NEPHRO-VITE) 0.8 MG TABS TABLET    Take 1 tablet by mouth 2 (two) times daily.   CALCITRIOL (ROCALTROL) 0.25 MCG CAPSULE    Take 0.25 mcg by mouth every morning.   CAPSAICIN (CAPSAGEL) 0.025 % GEL    Apply 1 application topically 2 (two) times daily as needed (for lower back pain).   DOXYCYCLINE (VIBRAMYCIN) 100 MG CAPSULE    Take 100 mg by mouth 2 (two) times daily.   IPRATROPIUM-ALBUTEROL (DUONEB) 0.5-2.5 (3) MG/3ML SOLN    Take 3 mLs by nebulization every 6 (six) hours as needed (for wheezing/shortness of breath).   LAMOTRIGINE (LAMICTAL) 25 MG TABLET    Take 2 tablets (50 mg total) by mouth daily.   LEVOTHYROXINE (SYNTHROID, LEVOTHROID) 100 MCG TABLET    Take 100 mcg by mouth daily before breakfast.   LORATADINE (CLARITIN) 10 MG TABLET    Take 1 tablet (10 mg total) by mouth daily.   MIRTAZAPINE (REMERON SOL-TAB) 15 MG DISINTEGRATING TABLET    Take 1 tablet (15 mg total) by mouth at bedtime.   POLYETHYLENE GLYCOL (MIRALAX / GLYCOLAX) PACKET    Take 17 g by mouth daily as needed for mild constipation.    SACCHAROMYCES BOULARDII (FLORASTOR) 250 MG CAPSULE    Take 250 mg by mouth 2 (two) times daily.   SUCROFERRIC OXYHYDROXIDE (VELPHORO) 500 MG CHEWABLE TABLET    Chew 500-1,000 mg by mouth See admin instructions. Give 1000mg  with meals, May also give 500 mg by mouth as needed with snacks.   TRAVOPROST, BAK FREE, (TRAVATAN) 0.004 % SOLN OPHTHALMIC SOLUTION    Place 1 drop into both eyes at bedtime.  Modified Medications   No medications on file  Discontinued Medications   WARFARIN (COUMADIN) 7.5 MG TABLET    Take 7.5 mg by mouth daily.     SIGNIFICANT DIAGNOSTIC EXAMS   05-31-15: ct of chest: 1. 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 2. Soft tissue swelling in the  LEFT chest wall likely related to venous occlusion. There is prominent adenopathy in LEFT axilla and sub pectoralis location. Presumably this relates to venous occlusion. Cannot exclude a malignant process but is felt less likely. The adenopathy and swelling is new from 08/15/2014 3. Central venous line from a inferior approach with tip is in the RIGHT atrium and distal SVC. 4. Diffuse ground-glass opacities not changed from prior. 5. Resolution of LEFT upper lobe pulmonary nodule.  Ureteral   06-02-15: left upper arm  shuntogram: Status post left upper extremity fistulagram demonstrating occluded stent in the subclavian vein at the level of the left clavicular head, as a cause for increased venous pressure within the left upper extremity.  12-05-15: 2-d echo: - Left ventricle: The cavity size was normal. There was mild concentric hypertrophy. Systolic function was normal. The estimated ejection fraction was in the range of 50% to 55%. Regional wall motion abnormalities cannot be excluded. The study is not technically sufficient to allow evaluation of LV diastolic function. - Aortic valve: There was mild regurgitation. - Mitral valve: There was mild regurgitation. - Left atrium: The atrium was mildly to moderately dilated. - Right ventricle: Systolic function was mildly reduced. - Pulmonary arteries: Systolic pressure was mild to moderately elevated. - Inferior vena cava: The vessel was normal in size.   07-16-16: chest x-ray: Mild pulmonary edema  07-17-16: chest x-ray: No acute abnormalities. Pulmonary edema seen on previous exam improved.   LABS REVIEWED:   05-03-16: wbc 6.9; hgb 10.3; hct 33.2; mcv 90.7 plt 227; glucose 140; bun 98.3; creat 15.76; k+ 5.0; na++ 141; liver normal albumin 3.9; chol 101; ldl 35; trig 77; hdl 51; tsh 16.21; mag 2.4; phos 7.0  06-12-16: wbc 6.3; hgb 10.9; hct 33.6; mcv 88.4; plt 184; glucose 82; bun 59.6; creat 15.00; k+ 5.4; na++ 142; liver normal albumin 4.3 TSH  16.62 07-16-16: wbc 8.2; hgb 11.9; hct 37.3; mcv 87.8; plt 205; glucose 110; bun 82; creat 17.91; k+ 5.7; na++ 138; liver normal albumin 3.9 07-22-16: wbc 9.6; hgb 12.0; hct 36.4 ;mcv 86.1; plt 201; glucose 96; bun 49;creat 10.21; k+ 4.7; na++ 134; phos 5.8; albumin 3.6  07-30-16: tsh 11.77  09-09-16: tsh 6.84; free T4: 1.27   Review of Systems Has sinus congestion; pain; and drainage present  Constitutional: Negative for appetite change and fatigue.  Respiratory: Negative for cough, chest tightness and shortness of breath.   Cardiovascular: Negative for chest pain and leg swelling.  Gastrointestinal: Negative for abdominal pain and constipation.  Musculoskeletal: no complaint of pain  Skin: Negative for pallor.  Psychiatric/Behavioral: The patient is not nervous/anxious.     Physical Exam Vitals reviewed. Constitutional: No distress.  Overweight   Eyes: Conjunctivae are normal.  Neck: Neck supple. No JVD present. No thyromegaly present.  Cardiovascular: Normal rate, regular rhythm and intact distal pulses.   Respiratory: has few scattered wheezes .  GI: Soft. Bowel sounds are normal. He exhibits no distension. There is no tenderness.  Musculoskeletal: He exhibits no edema.  Able to move all extremities   Lymphadenopathy:    He has no cervical adenopathy.  Neurological: He is alert.  Skin: Skin is warm and dry. He is not diaphoretic.  Left thigh A/V graft placed 07-22-16.   Psychiatric: He has a normal mood and affect.      ASSESSMENT/ PLAN:  1. Hypothyroid: tsh is 6.84; free T4: 1.27; will increase synthroid to 112 mcg daily will check thyroid labs in 6 weeks.    MD is aware of resident's narcotic use and is in agreement with current plan of care. We will attempt to wean resident as apropriate   Ok Edwards NP [facility]  Contact [phone] Monday through Friday 8am- 5pm  After hours call [phone]

## 2016-10-08 ENCOUNTER — Non-Acute Institutional Stay (SKILLED_NURSING_FACILITY): Payer: Medicare Other | Admitting: Adult Health

## 2016-10-08 DIAGNOSIS — I5032 Chronic diastolic (congestive) heart failure: Secondary | ICD-10-CM

## 2016-10-08 DIAGNOSIS — E1122 Type 2 diabetes mellitus with diabetic chronic kidney disease: Secondary | ICD-10-CM | POA: Diagnosis not present

## 2016-10-08 DIAGNOSIS — I482 Chronic atrial fibrillation, unspecified: Secondary | ICD-10-CM

## 2016-10-08 DIAGNOSIS — Z9115 Patient's noncompliance with renal dialysis: Secondary | ICD-10-CM

## 2016-10-08 DIAGNOSIS — J449 Chronic obstructive pulmonary disease, unspecified: Secondary | ICD-10-CM | POA: Diagnosis not present

## 2016-10-08 DIAGNOSIS — T462X1A Poisoning by other antidysrhythmic drugs, accidental (unintentional), initial encounter: Secondary | ICD-10-CM

## 2016-10-08 DIAGNOSIS — N186 End stage renal disease: Secondary | ICD-10-CM

## 2016-10-08 DIAGNOSIS — N185 Chronic kidney disease, stage 5: Secondary | ICD-10-CM | POA: Diagnosis not present

## 2016-10-08 DIAGNOSIS — D631 Anemia in chronic kidney disease: Secondary | ICD-10-CM

## 2016-10-08 DIAGNOSIS — Z992 Dependence on renal dialysis: Secondary | ICD-10-CM | POA: Diagnosis not present

## 2016-10-08 DIAGNOSIS — E032 Hypothyroidism due to medicaments and other exogenous substances: Secondary | ICD-10-CM

## 2016-10-08 DIAGNOSIS — I120 Hypertensive chronic kidney disease with stage 5 chronic kidney disease or end stage renal disease: Secondary | ICD-10-CM

## 2016-10-12 DIAGNOSIS — Z9115 Patient's noncompliance with renal dialysis: Secondary | ICD-10-CM | POA: Insufficient documentation

## 2016-10-24 DEATH — deceased

## 2016-10-26 NOTE — Progress Notes (Signed)
Location:   [facility]   Place of Service:  SNF (31)   CODE STATUS: dnr  Allergies  Allergen Reactions  . Penicillins Other (See Comments)    Reaction:  Unknown     Chief Complaint  Patient presents with  . Medical Management of Chronic Issues    HPI:  He is a long term resident of this facility being seen for the management of his chronic illnesses. He will decline dialysis frequently. He is not voicing any complaints at this time. There are no nursing concerns at this time.    Past Medical History:  Diagnosis Date  . Arthritis   . Asthma   . Cancer ([place])   . COPD (chronic obstructive pulmonary disease) ([place])   . Coronary artery disease   . Diabetes mellitus without complication ([place])   . Hypertension   . Renal disorder     Past Surgical History:  Procedure Laterality Date  . AV FISTULA PLACEMENT Left 07/22/2016   Procedure: INSERTION OF ARTERIOVENOUS (AV) LOOP GRAFT LEFT THIGH;  Surgeon: Waynetta Sandy, MD;  Location: [place];  Service: Vascular;  Laterality: Left;  . INSERTION OF DIALYSIS CATHETER    . INSERTION OF DIALYSIS CATHETER N/A 05/29/2015   Procedure: INSERTION OF DIALYSIS CATHETER;  Surgeon: Angelia Mould, MD;  Location: [place];  Service: Vascular;  Laterality: N/A;  . IR GENERIC HISTORICAL  07/16/2016   IR US GUIDE VASC ACCESS RIGHT 07/16/2016 Arne Cleveland, MD [facility]  . IR GENERIC HISTORICAL  07/16/2016   IR FLUORO GUIDE CV LINE RIGHT 07/16/2016 Arne Cleveland, MD [facility]  . IR GENERIC HISTORICAL  07/16/2016   IR US GUIDE VASC ACCESS RIGHT 07/16/2016 Arne Cleveland, MD [facility]  . IR GENERIC HISTORICAL  07/16/2016   IR VENO/EXT/UNI RIGHT 07/16/2016 Arne Cleveland, MD [facility]  . IR GENERIC HISTORICAL  09/03/2016   IR US GUIDE VASC ACCESS LEFT 09/03/2016 Marybelle Killings, MD [facility]  . IR GENERIC HISTORICAL Left 09/03/2016   IR DIALY SHUNT INTRO NEEDLE/INTRACATH INITIAL W/IMG LEFT 09/03/2016 Marybelle Killings, MD  [facility]  . LIGATION OF ARTERIOVENOUS  FISTULA Left 07/17/2016   Procedure: LIGATION OF BRACHIOCEPHALIC ARTERIOVENOUS  FISTULA  Left arm;  Surgeon: Rosetta Posner, MD;  Location: [place];  Service: Vascular;  Laterality: Left;  . NO PAST SURGERIES    . PERIPHERAL VASCULAR CATHETERIZATION N/A 10/25/2015   Procedure: Dialysis/Perma Catheter Insertion;  Surgeon: Katha Cabal, MD;  Location: [place] [facility];  Service: Cardiovascular;  Laterality: N/A;  . PERIPHERAL VASCULAR CATHETERIZATION Left 02/22/2016   Procedure: A/V Shuntogram/Fistulagram;  Surgeon: Algernon Huxley, MD;  Location: [place] [facility];  Service: Cardiovascular;  Laterality: Left;  . PERIPHERAL VASCULAR CATHETERIZATION Right 03/19/2016   Procedure: Dialysis/Perma Catheter Insertion;  Surgeon: Katha Cabal, MD;  Location: [place] [facility];  Service: Cardiovascular;  Laterality: Right;  . PERIPHERAL VASCULAR CATHETERIZATION N/A 04/22/2016   Procedure: Dialysis/Perma Catheter Removal;  Surgeon: Algernon Huxley, MD;  Location: [place] [facility];  Service: Cardiovascular;  Laterality: N/A;    Social History   Social History  . Marital status: Legally Separated    Spouse name: N/A  . Number of children: N/A  . Years of education: N/A   Occupational History  . Not on file.   Social History Main Topics  . Smoking status: Never Smoker  . Smokeless tobacco: Never Used  . Alcohol use No  . Drug use: Yes  Types: Marijuana     Comment: Occasionally  . Sexual activity: Not on file   Other Topics Concern  . Not on file   Social History Narrative  . No narrative on file   Family History  Problem Relation Age of Onset  . Hypertension Other   . Diabetes Other       VITAL SIGNS BP 126/76   Pulse 70   Temp 98.7 F (37.1 C)   Resp 18   Ht 5\' 7"  (1.702 m)   Wt 200 lb (90.7 kg)   SpO2 96%   BMI 31.32 kg/m   Patient's Medications  New Prescriptions   No medications on file  Previous Medications    ACETAMINOPHEN (TYLENOL) 325 MG TABLET    Take 650 mg by mouth every 6 (six) hours as needed for mild pain, fever or headache.   AMIODARONE (PACERONE) 400 MG TABLET    Take 1 tablet (400 mg total) by mouth daily.   ASPIRIN 81 MG CHEWABLE TABLET    Chew 1 tablet (81 mg total) by mouth daily.   B COMPLEX-VITAMIN C-FOLIC ACID (NEPHRO-VITE) 0.8 MG TABS TABLET    Take 1 tablet by mouth 2 (two) times daily.   CALCITRIOL (ROCALTROL) 0.25 MCG CAPSULE    Take 0.25 mcg by mouth every morning.   CAPSAICIN (CAPSAGEL) 0.025 % GEL    Apply 1 application topically 2 (two) times daily as needed (for lower back pain).   IPRATROPIUM-ALBUTEROL (DUONEB) 0.5-2.5 (3) MG/3ML SOLN    Take 3 mLs by nebulization every 6 (six) hours as needed (for wheezing/shortness of breath).   LAMOTRIGINE (LAMICTAL) 25 MG TABLET    Take 2 tablets (50 mg total) by mouth daily.   LEVOTHYROXINE (SYNTHROID, LEVOTHROID) 112 MCG TABLET    Take 112 mcg by mouth daily before breakfast.   LORATADINE (CLARITIN) 10 MG TABLET    Take 1 tablet (10 mg total) by mouth daily.   MIRTAZAPINE (REMERON SOL-TAB) 15 MG DISINTEGRATING TABLET    Take 1 tablet (15 mg total) by mouth at bedtime.   POLYETHYLENE GLYCOL (MIRALAX / GLYCOLAX) PACKET    Take 17 g by mouth daily as needed for mild constipation.    SACCHAROMYCES BOULARDII (FLORASTOR) 250 MG CAPSULE    Take 250 mg by mouth 2 (two) times daily.   SUCROFERRIC OXYHYDROXIDE (VELPHORO) 500 MG CHEWABLE TABLET    Chew 500-1,000 mg by mouth See admin instructions. Give 1000mg  with meals, May also give 500 mg by mouth as needed with snacks.   TRAVOPROST, BAK FREE, (TRAVATAN) 0.004 % SOLN OPHTHALMIC SOLUTION    Place 1 drop into both eyes at bedtime.  Modified Medications   No medications on file  Discontinued Medications   No medications on file     SIGNIFICANT DIAGNOSTIC EXAMS   05-31-15: ct of chest: 1. 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 2. Soft tissue swelling in the LEFT chest wall likely  related to venous occlusion. There is prominent adenopathy in LEFT axilla and sub pectoralis location. Presumably this relates to venous occlusion. Cannot exclude a malignant process but is felt less likely. The adenopathy and swelling is new from 08/15/2014 3. Central venous line from a inferior approach with tip is in the RIGHT atrium and distal SVC. 4. Diffuse ground-glass opacities not changed from prior. 5. Resolution of LEFT upper lobe pulmonary nodule.  Ureteral   06-02-15: left upper arm shuntogram: Status post left upper extremity fistulagram demonstrating occluded stent in the subclavian vein  at the level of the left clavicular head, as a cause for increased venous pressure within the left upper extremity.  12-05-15: 2-d echo: - Left ventricle: The cavity size was normal. There was mild concentric hypertrophy. Systolic function was normal. The estimated ejection fraction was in the range of 50% to 55%. Regional wall motion abnormalities cannot be excluded. The study is not technically sufficient to allow evaluation of LV diastolic function. - Aortic valve: There was mild regurgitation. - Mitral valve: There was mild regurgitation. - Left atrium: The atrium was mildly to moderately dilated. - Right ventricle: Systolic function was mildly reduced. - Pulmonary arteries: Systolic pressure was mild to moderately elevated. - Inferior vena cava: The vessel was normal in size.   07-16-16: chest x-ray: Mild pulmonary edema  07-17-16: chest x-ray: No acute abnormalities. Pulmonary edema seen on previous exam improved.   LABS REVIEWED:   05-03-16: wbc 6.9; hgb 10.3; hct 33.2; mcv 90.7 plt 227; glucose 140; bun 98.3; creat 15.76; k+ 5.0; na++ 141; liver normal albumin 3.9; chol 101; ldl 35; trig 77; hdl 51; tsh 16.21; mag 2.4; phos 7.0  06-12-16: wbc 6.3; hgb 10.9; hct 33.6; mcv 88.4; plt 184; glucose 82; bun 59.6; creat 15.00; k+ 5.4; na++ 142; liver normal albumin 4.3 TSH 16.62 07-16-16: wbc 8.2;  hgb 11.9; hct 37.3; mcv 87.8; plt 205; glucose 110; bun 82; creat 17.91; k+ 5.7; na++ 138; liver normal albumin 3.9 07-22-16: wbc 9.6; hgb 12.0; hct 36.4 ;mcv 86.1; plt 201; glucose 96; bun 49;creat 10.21; k+ 4.7; na++ 134; phos 5.8; albumin 3.6  07-30-16: tsh 11.77  09-09-16: tsh 6.84; free T4: 1.27   Review of Systems Constitutional: Negative for appetite change and fatigue.  Respiratory: Negative for cough, chest tightness and shortness of breath.   Cardiovascular: Negative for chest pain and leg swelling.  Gastrointestinal: Negative for abdominal pain and constipation.  Musculoskeletal: no complaint of pain  Skin: Negative for pallor.  Psychiatric/Behavioral: The patient is not nervous/anxious.     Physical Exam Vitals reviewed. Constitutional: No distress.  Overweight   Eyes: Conjunctivae are normal.  Neck: Neck supple. No JVD present. No thyromegaly present.  Cardiovascular: Normal rate, regular rhythm and intact distal pulses.   Respiratory: has few scattered wheezes .  GI: Soft. Bowel sounds are normal. He exhibits no distension. There is no tenderness.  Musculoskeletal: He exhibits no edema.  Able to move all extremities   Lymphadenopathy:    He has no cervical adenopathy.  Neurological: He is alert.  Skin: Skin is warm and dry. He is not diaphoretic.  Left thigh A/V graft placed 07-22-16.   Psychiatric: He has a normal mood and affect.      ASSESSMENT/ PLAN:   1. Hypertension: will continue lopressor 12.5 mg twice daily   2. Chronic afib; is on amoidarone 400 mg daily and  lopressor 12.5 mg twice daily for rate control is on coumadin therapy 5 mg daily INR goal 2-2.5  3. COPD: will continue duoneb every 6 hours as needed and takes claritin 10 mg daily   4. Hypothyroidism: will continue synthroid 112 mcg daily tsh 6.48  5. bipolar depression: will continue remeron 15 mg nightly and will continue lamictal 50 mg daily to stabilize mood.  6. Anemia of chronic  kidney disease: hgb is 9.6 in on aranesp weekly at dialysis   7.ESRD: is on hemodialysis three times weekly; will continue renagel 2400 mg three times daily   8. Chronic diastolic heart failure: [id number] (12-05-15)  lopressor 12.5 mg twice daily fluid volume managed at dialysis  9. OSA: is not compliant with cpap  10.  CAD: no complaint of chest pain will continue asa 81 mg daily  11. Bipolar disorder: will continue remeron 15 mg nightly lamictal 50 mg daiy to stabilize mood. 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   13. Diabetes: is currently not on mediations will monitor    Ok Edwards NP [facility]  Contact [phone] Monday through Friday 8am- 5pm  After hours call [phone]

## 2017-01-30 IMAGING — CR DG CHEST 1V PORT
1 series · 3 of 3 positions shown · non-contrast
Comparison: 08/29/2016 at [DATE]

CLINICAL DATA: Respiratory failure

EXAM:
PORTABLE CHEST 1 VIEW

[Series 1: portable · 0.17mm/px · 3 of 3 slices shown]
[im 1/3]
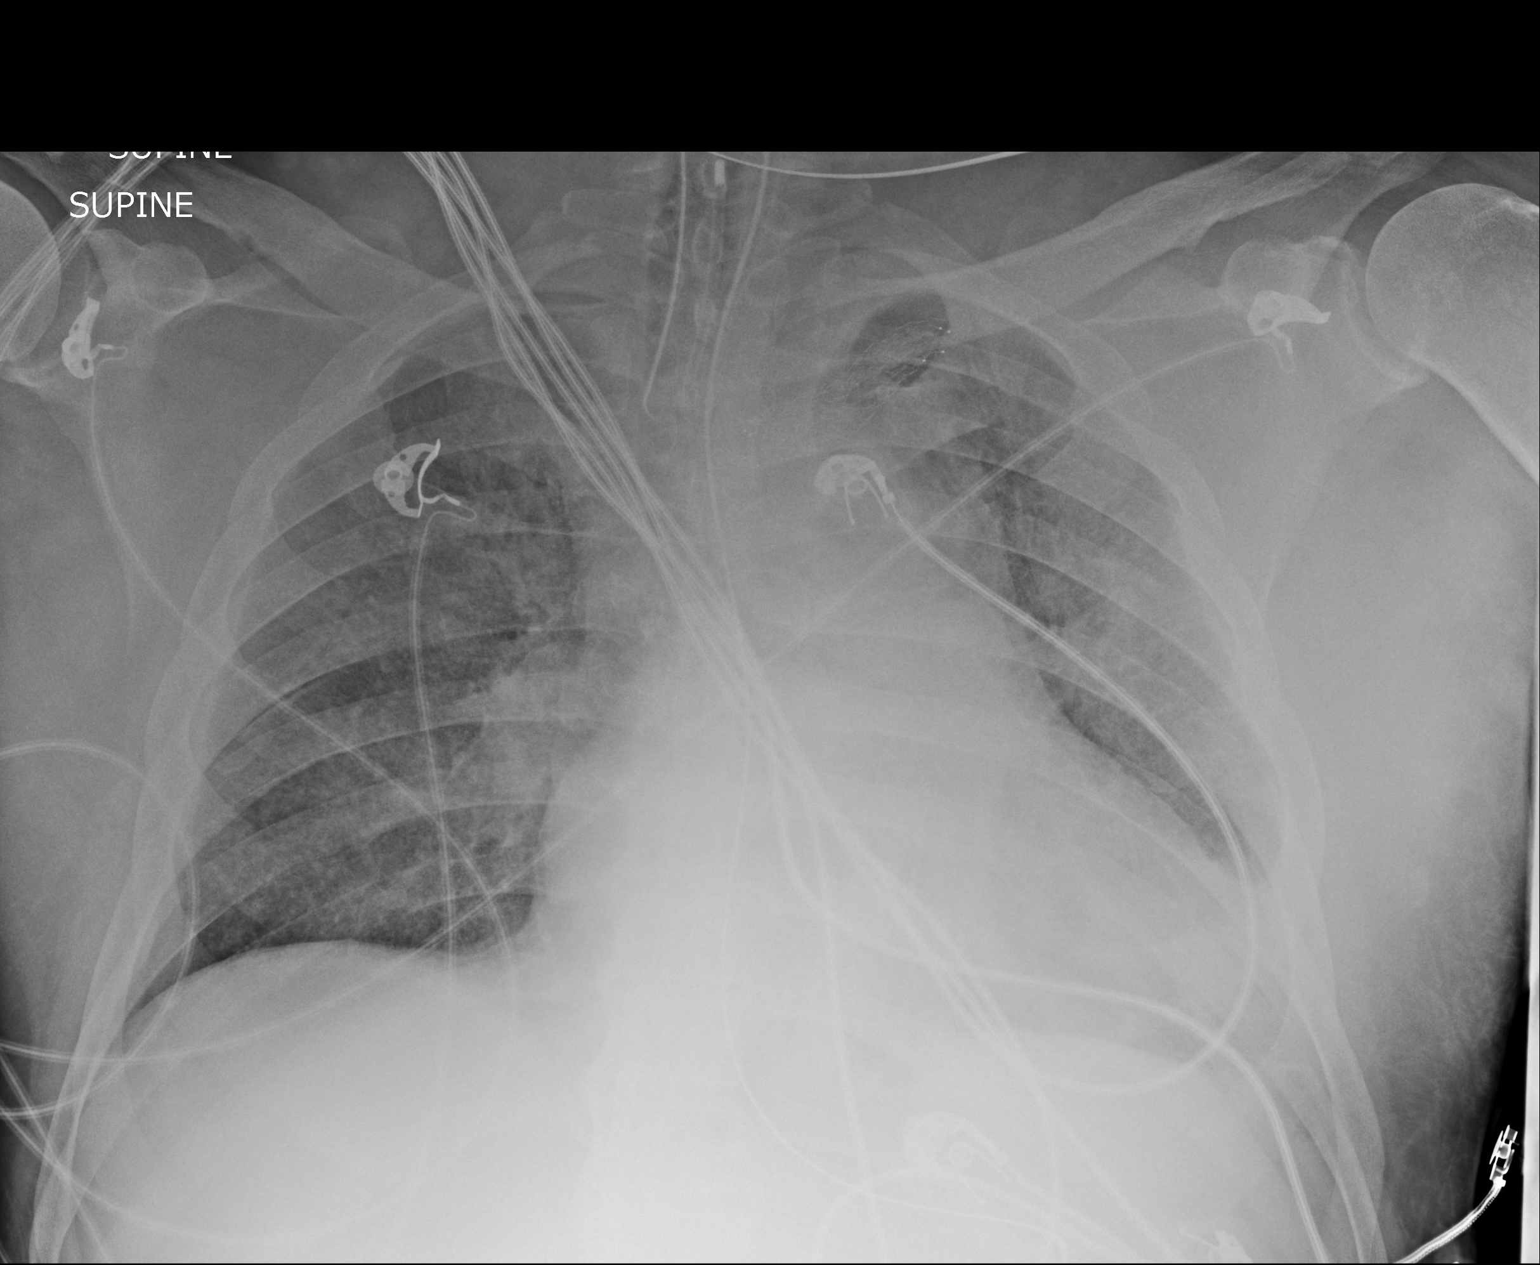
[im 2/3]
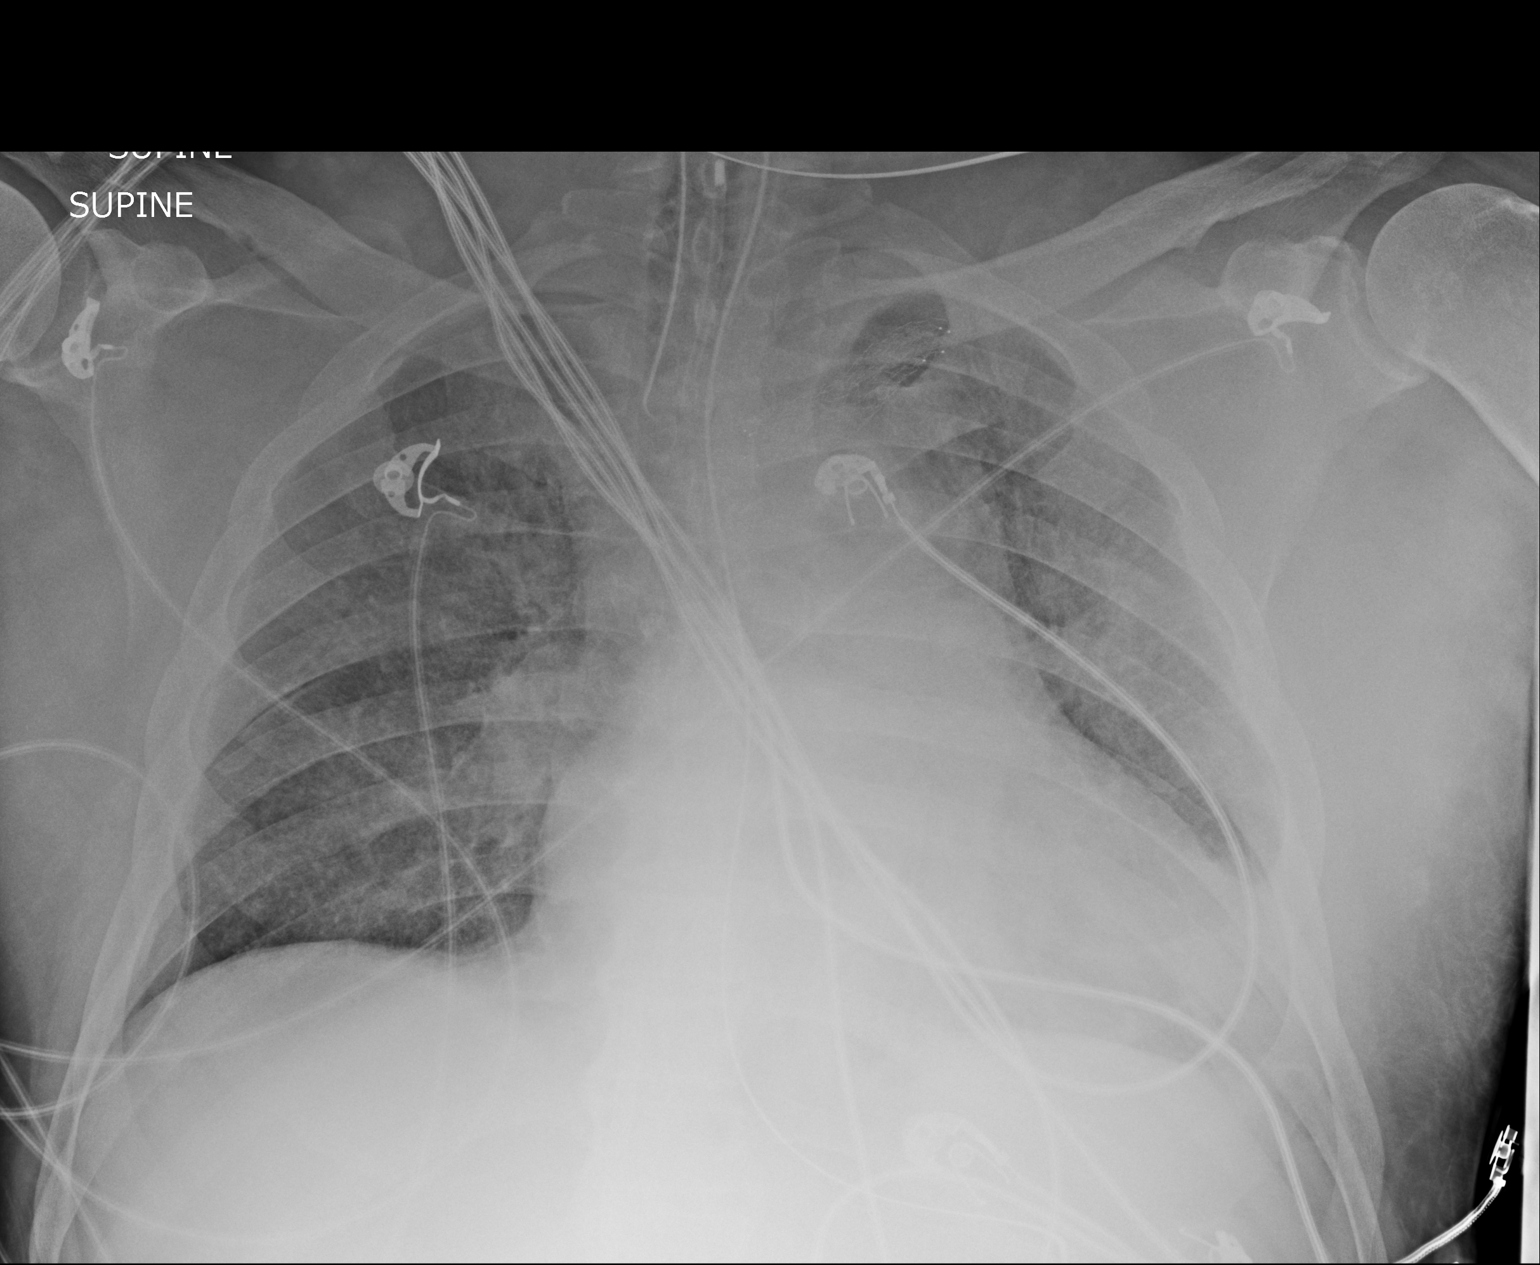
[im 3/3]
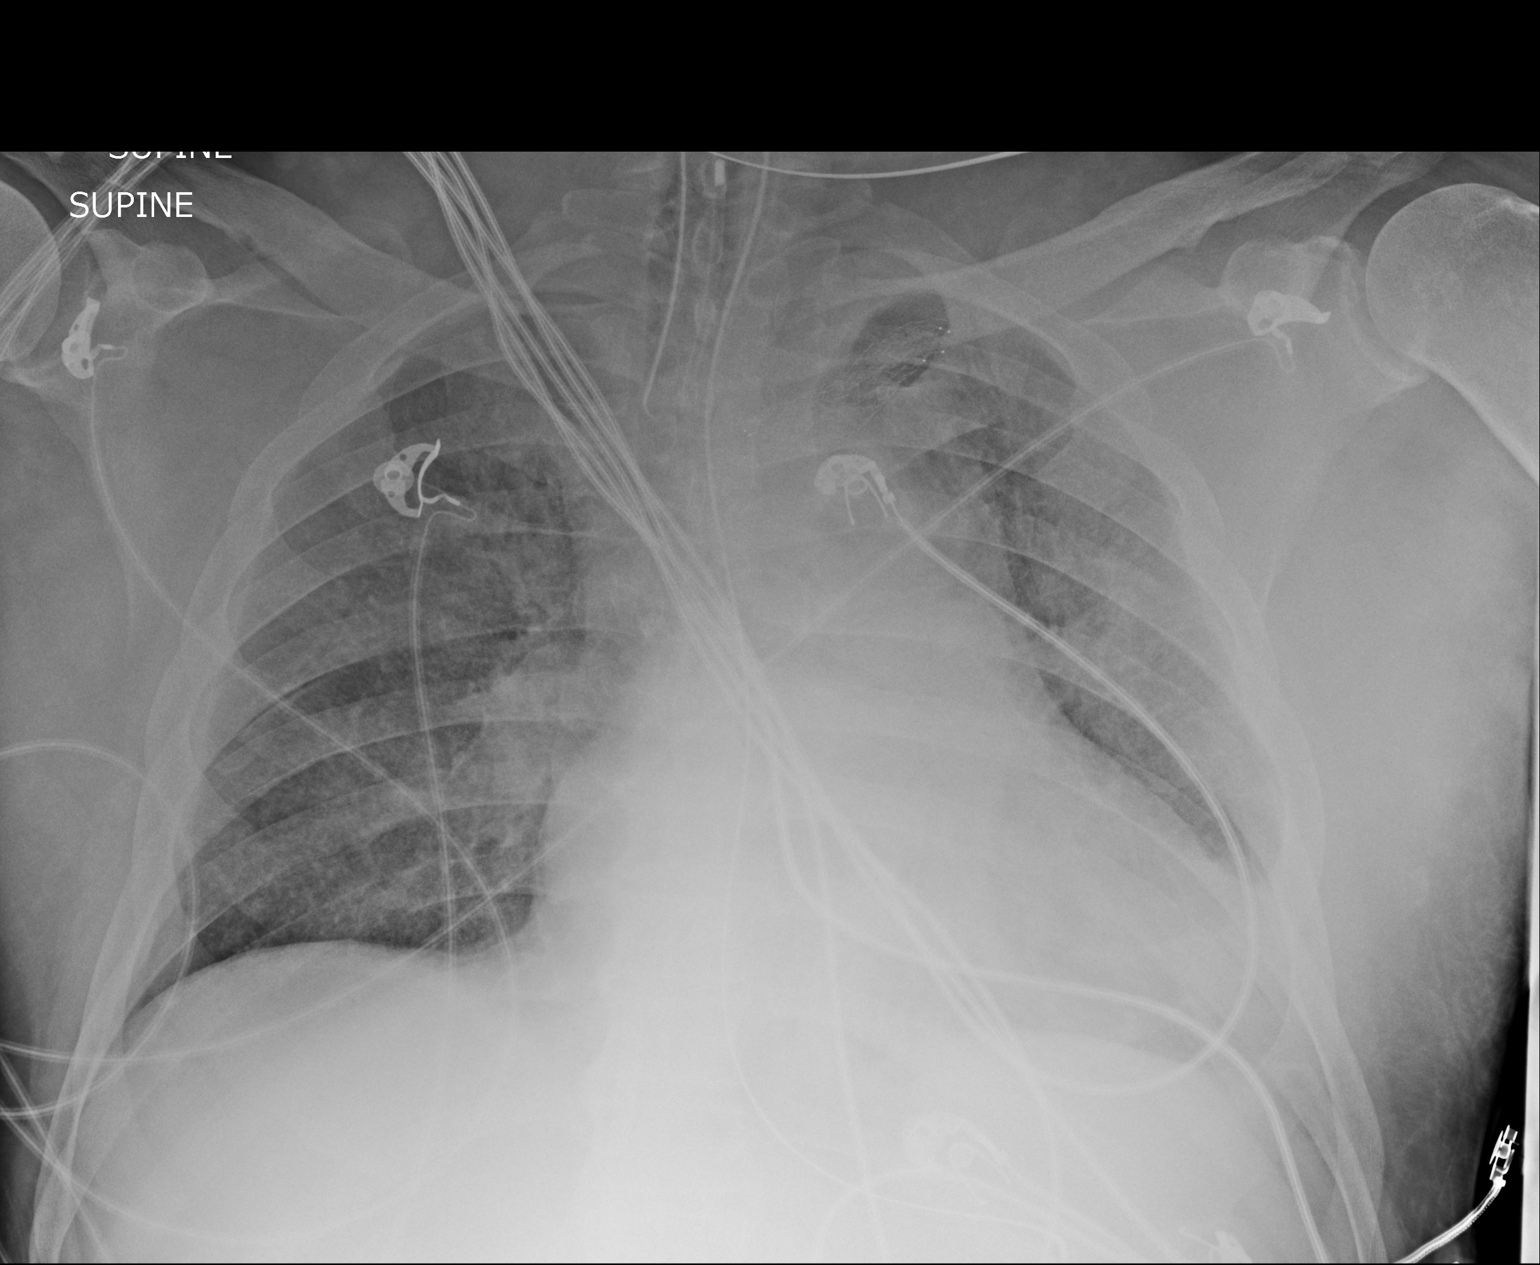

[3 of 3 positions shown; findings below may reference images not displayed]

FINDINGS: Endotracheal tube is 5 cm above the carina. Nasogastric tube extends
into the stomach and beyond the inferior edge of the image. Central
airspace opacities persist bilaterally without significant interval
change.
IMPRESSION: Support equipment appears satisfactorily positioned.

No significant interval change in the bilateral airspace opacities.

## 2017-01-30 IMAGING — CR DG CHEST 1V PORT
1 series · 1 of 1 positions shown · non-contrast
Comparison: 07/17/2016

CLINICAL DATA: Dyspnea.

EXAM:
PORTABLE CHEST 1 VIEW

[portable]
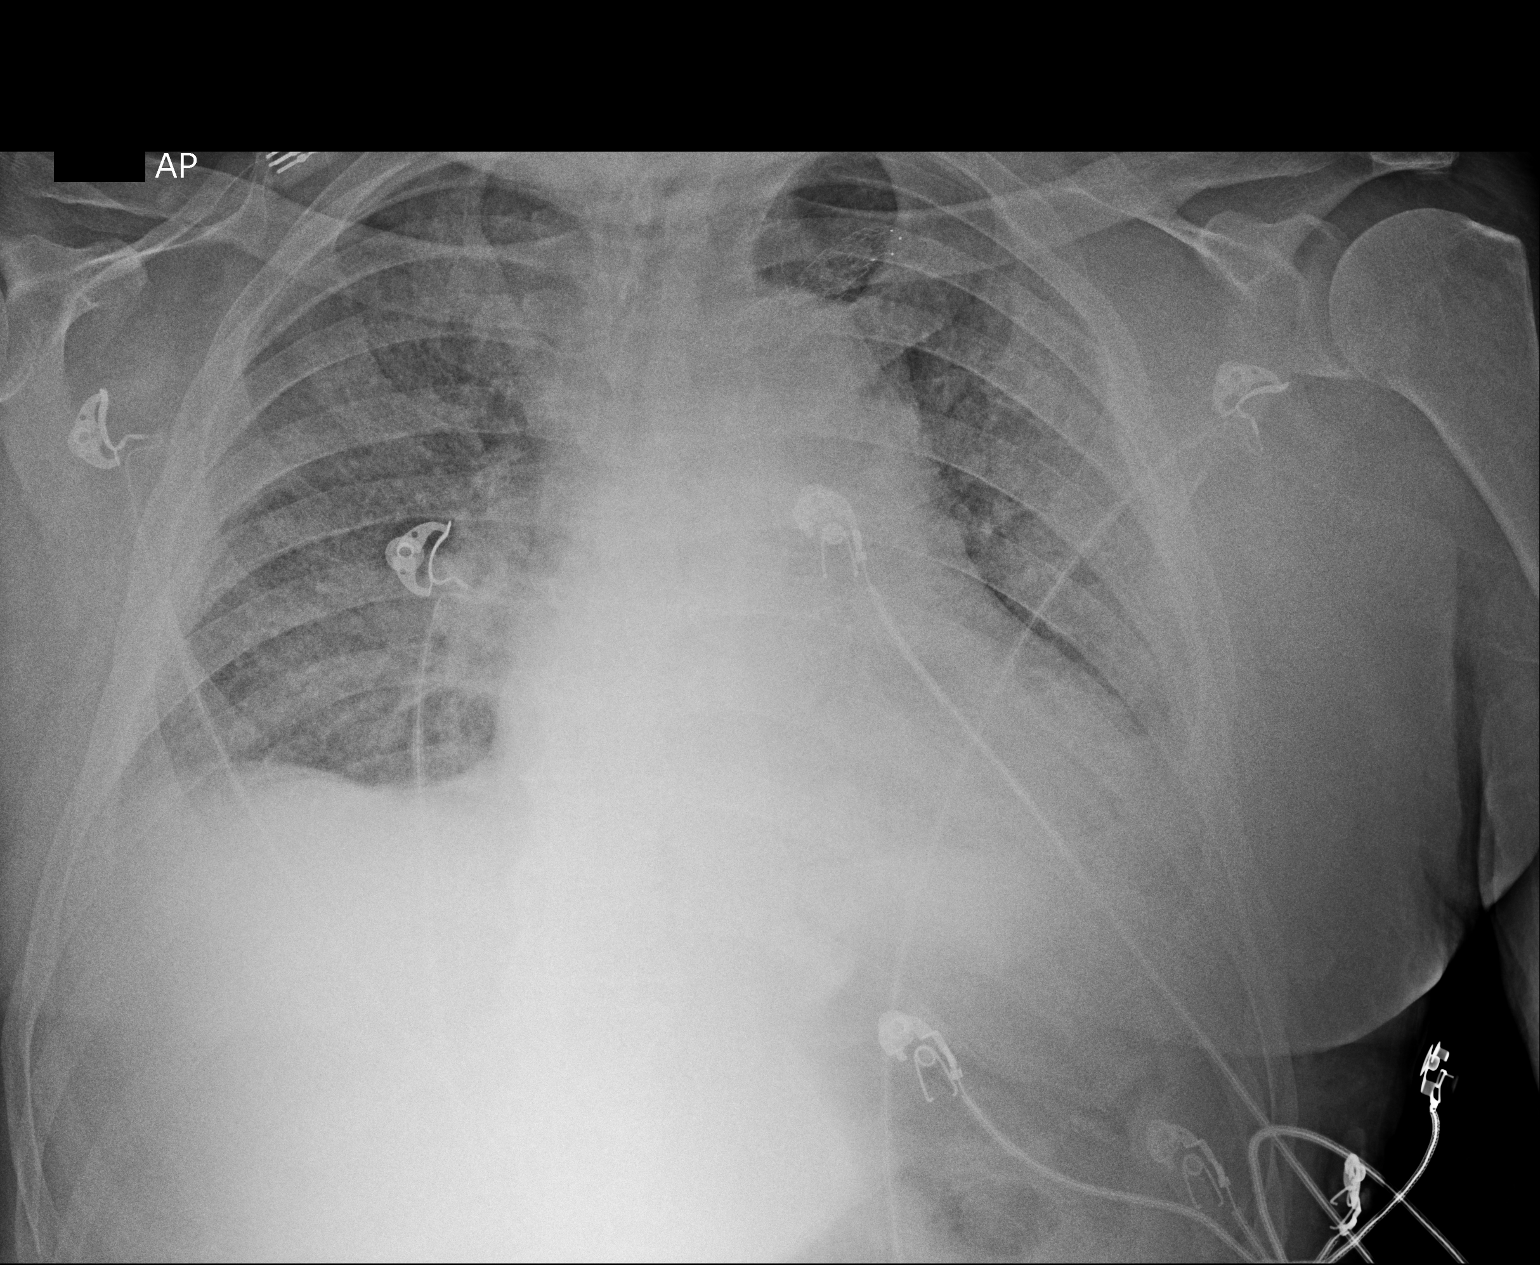

[1 of 1 positions shown; findings below may reference images not displayed]

FINDINGS: Vascular and interstitial congestive changes. Central airspace
opacity. Probable small effusions. Marked cardiomegaly.
IMPRESSION: The findings likely represent congestive heart failure.

## 2017-02-04 IMAGING — XA IR DAILY SHUNT INTRO NEEDLE/INTRACATH INITIAL W/IMG*L*
1 series · 14 of 15 positions shown · IV contrast (IODINE)
Comparison: none

INDICATION: Possible thrombosed left thigh AV graft

[Series 300: ir thrombectomy av fistula w/thrombolysi · 14 of 15 slices shown]
[im 1/15]
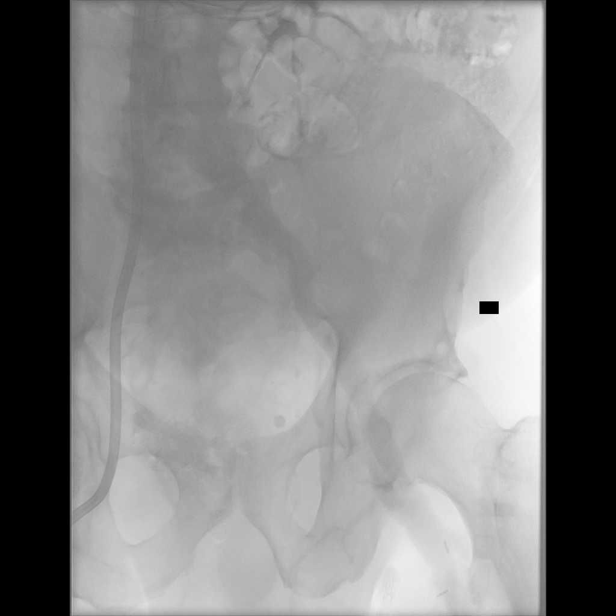
[im 2/15]
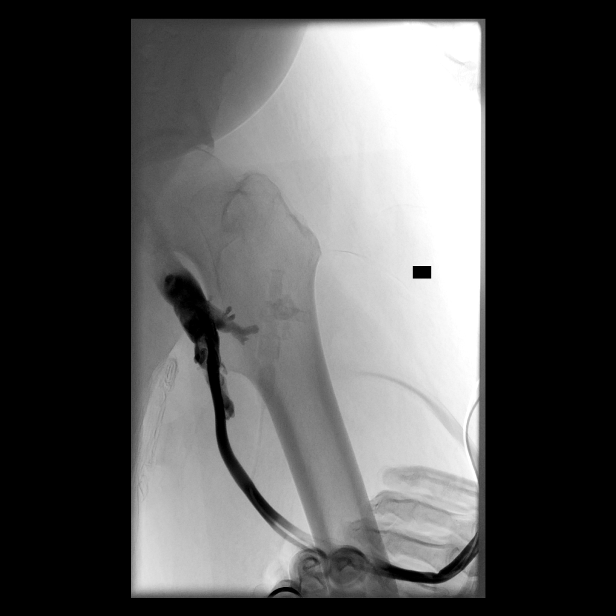
[im 3/15]
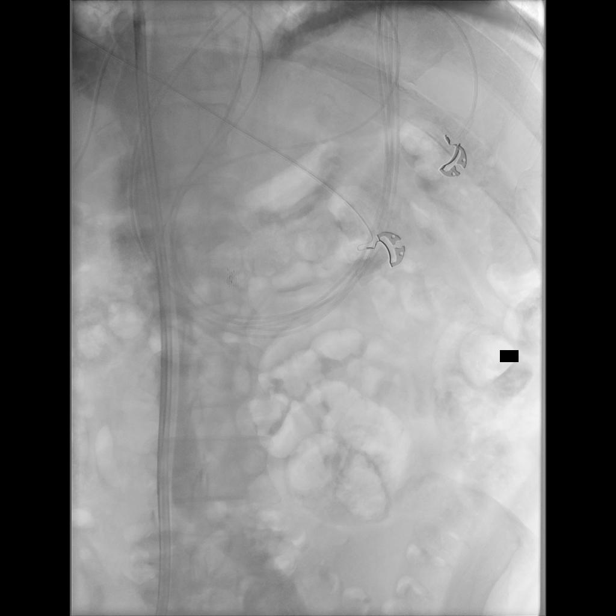
[im 4/15]
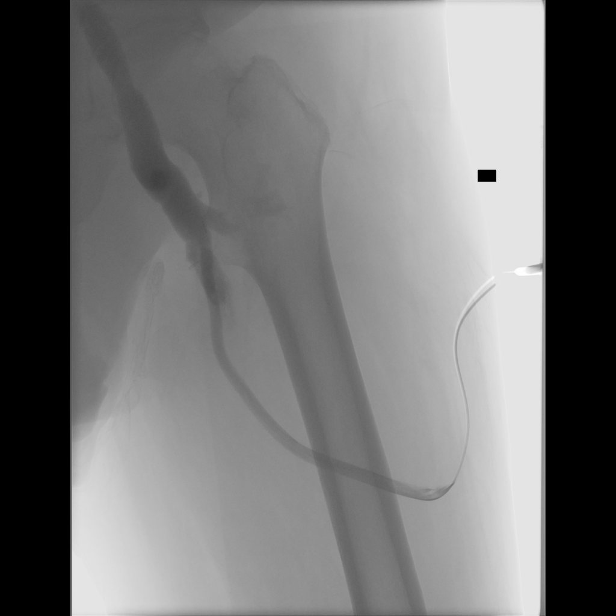
[im 5/15]
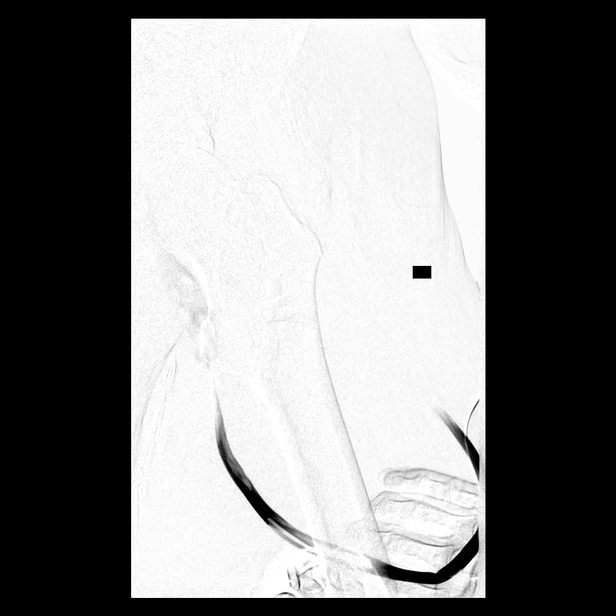
[im 6/15]
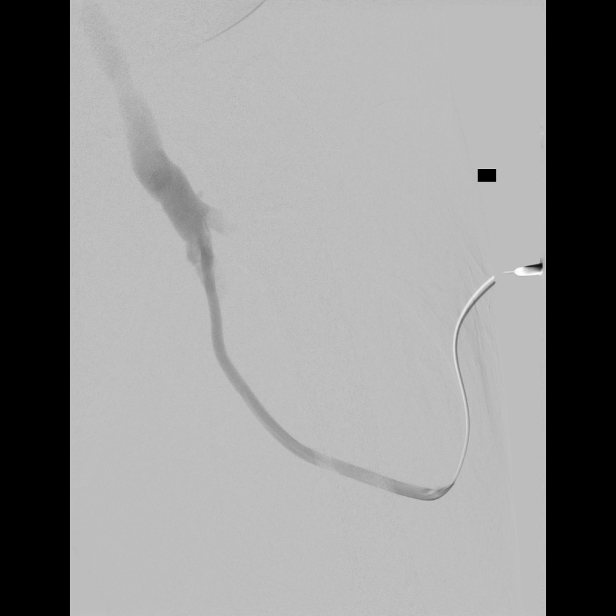
[im 7/15]
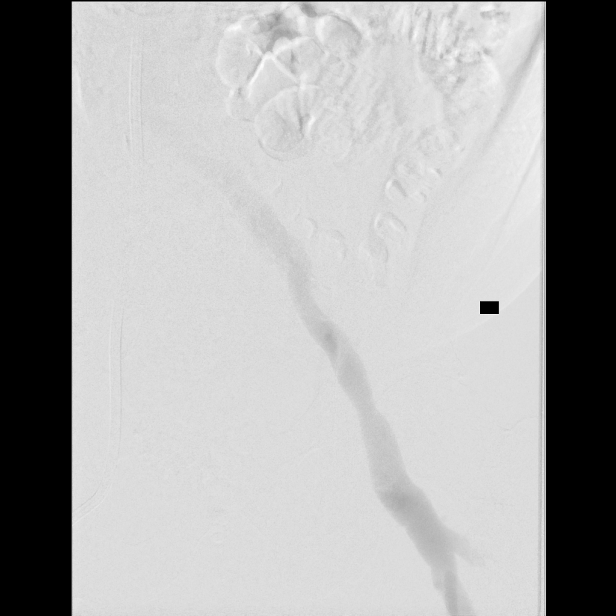
[im 9/15]
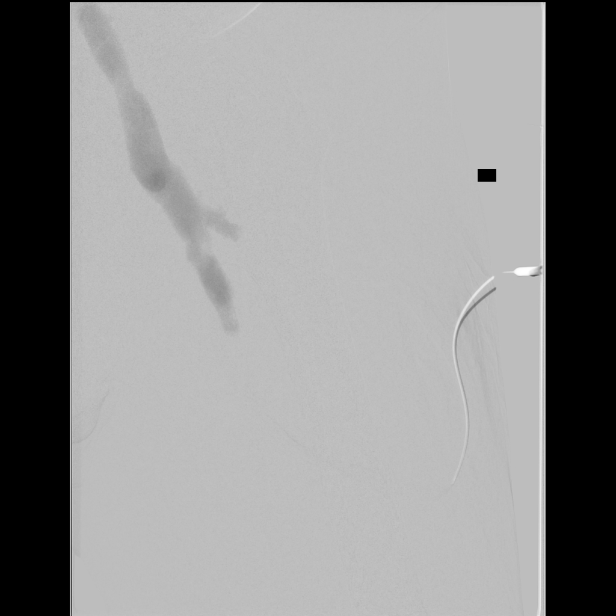
[im 10/15]
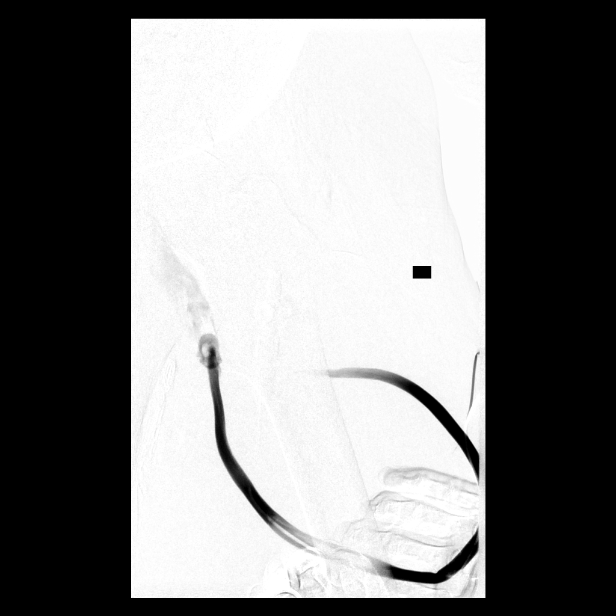
[im 11/15]
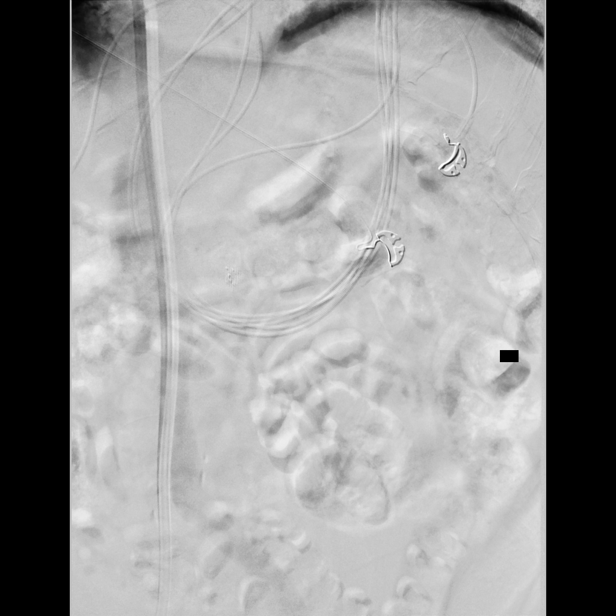
[im 12/15]
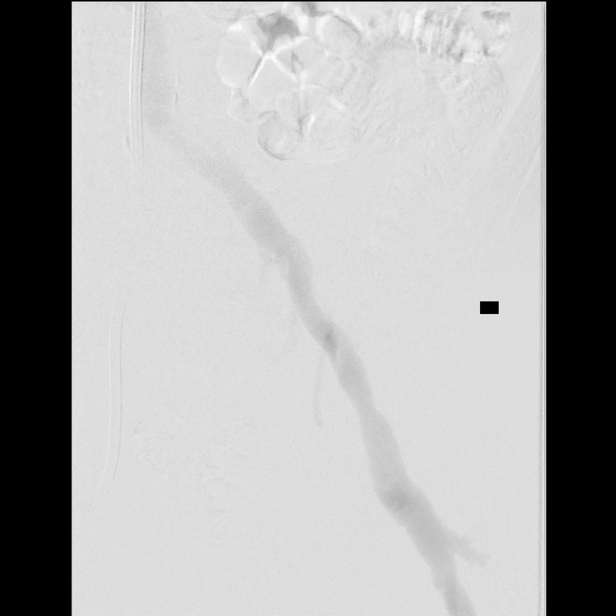
[im 13/15]
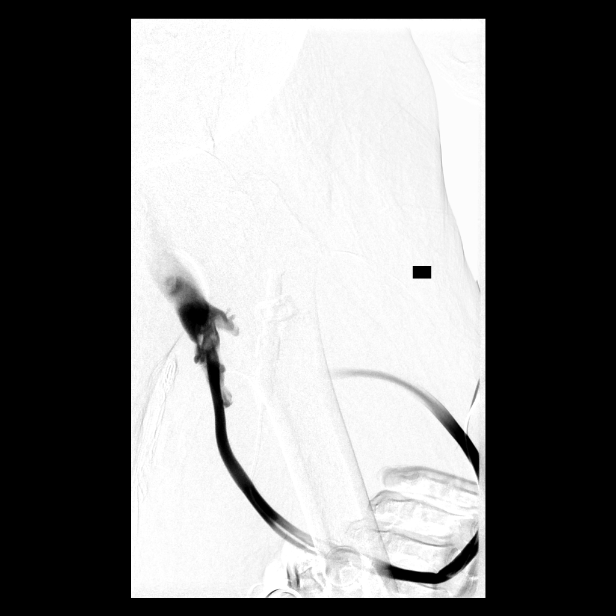
[im 14/15]
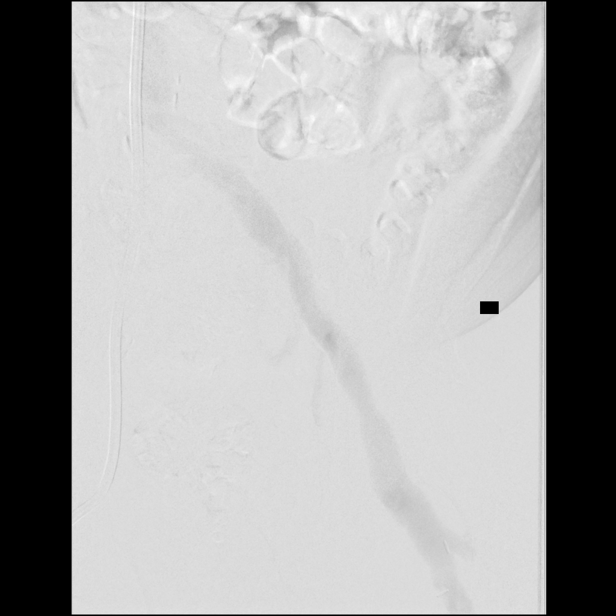
[im 15/15]
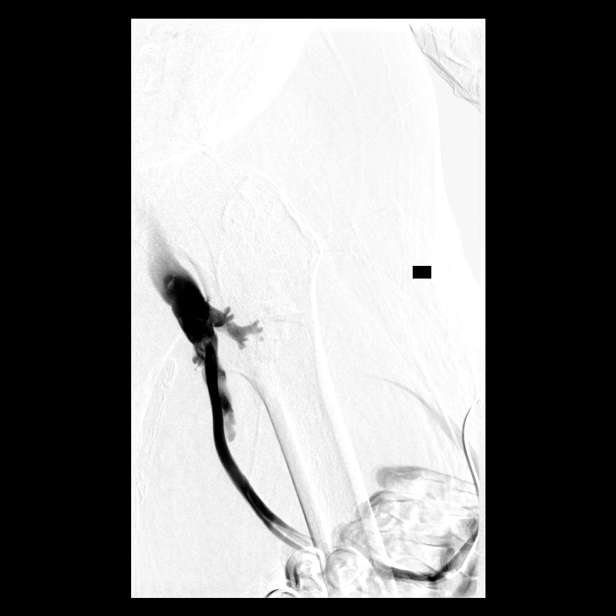

[14 of 15 positions shown; findings below may reference images not displayed]

EXAM:
IR DAILY SHUNT INTRO NEEDLE/INTRACATH INITIAL W/IMG LEFT; IR
ULTRASOUND GUIDANCE VASC ACCESS LEFT

MEDICATIONS:
Versus at 0.5 mg.  Fentanyl 25 mcg

Contrast 40 cc Ksovue-RAA

ANESTHESIA/SEDATION:
Moderate Sedation Time:  15

The patient was continuously monitored during the procedure by the
interventional radiology nurse under my direct supervision.

FLUOROSCOPY TIME:  Fluoroscopy Time:  minutes 54 seconds (21 mGy).

COMPLICATIONS:
None immediate.

PROCEDURE:
Informed written consent was obtained from the patient after a
thorough discussion of the procedural risks, benefits and
alternatives. All questions were addressed. Maximal Sterile Barrier
Technique was utilized including caps, mask, sterile gowns, sterile
gloves, sterile drape, hand hygiene and skin antiseptic. A timeout
was performed prior to the initiation of the procedure.

1% lidocaine was utilized for local anesthesia. Under sonographic
guidance, a micropuncture needle was inserted into the AV graft
directed towards the venous anastomosis and removed over a 018 wire.
A 5 French transitional dilator was inserted. The identical
procedure was performed towards the arterial anastomosis. Blood flow
was noted from the sheath. Contrast was then injected for imaging.
The dilators were removed and hemostasis was achieved with 3-0
Ethilon figure 8 knots.
FINDINGS: The graft, venous anastomosis, and central venous structures are all
widely patent. The arterial anastomosis was not clearly visualized.
IMPRESSION: The arterial anastomosis was not clearly imaged, however the
remainder of the AV graft and central venous structures are widely
patent. The graft is not thrombosed. It is ready for use.

ACCESS:
This access remains amenable to future percutaneous interventions as
clinically indicated.
